# Patient Record
Sex: Female | Born: 1960
Health system: Southern US, Community
[De-identification: ages and names within clinical notes are randomized; demographics above are authoritative.]

## PROBLEM LIST (undated history)

## (undated) DIAGNOSIS — Z7901 Long term (current) use of anticoagulants: Secondary | ICD-10-CM

## (undated) DIAGNOSIS — E11319 Type 2 diabetes mellitus with unspecified diabetic retinopathy without macular edema: Secondary | ICD-10-CM

## (undated) DIAGNOSIS — R112 Nausea with vomiting, unspecified: Secondary | ICD-10-CM

## (undated) DIAGNOSIS — M5136 Other intervertebral disc degeneration, lumbar region: Secondary | ICD-10-CM

## (undated) DIAGNOSIS — Z9889 Other specified postprocedural states: Secondary | ICD-10-CM

## (undated) DIAGNOSIS — D631 Anemia in chronic kidney disease: Secondary | ICD-10-CM

## (undated) DIAGNOSIS — I509 Heart failure, unspecified: Secondary | ICD-10-CM

## (undated) DIAGNOSIS — N186 End stage renal disease: Secondary | ICD-10-CM

## (undated) DIAGNOSIS — I5030 Unspecified diastolic (congestive) heart failure: Secondary | ICD-10-CM

## (undated) DIAGNOSIS — G4730 Sleep apnea, unspecified: Secondary | ICD-10-CM

## (undated) DIAGNOSIS — E669 Obesity, unspecified: Secondary | ICD-10-CM

## (undated) DIAGNOSIS — M5431 Sciatica, right side: Secondary | ICD-10-CM

## (undated) DIAGNOSIS — E1140 Type 2 diabetes mellitus with diabetic neuropathy, unspecified: Secondary | ICD-10-CM

## (undated) DIAGNOSIS — N189 Chronic kidney disease, unspecified: Principal | ICD-10-CM

## (undated) DIAGNOSIS — Z951 Presence of aortocoronary bypass graft: Secondary | ICD-10-CM

## (undated) DIAGNOSIS — I219 Acute myocardial infarction, unspecified: Secondary | ICD-10-CM

## (undated) DIAGNOSIS — I639 Cerebral infarction, unspecified: Secondary | ICD-10-CM

## (undated) DIAGNOSIS — H3320 Serous retinal detachment, unspecified eye: Secondary | ICD-10-CM

## (undated) DIAGNOSIS — E119 Type 2 diabetes mellitus without complications: Secondary | ICD-10-CM

## (undated) DIAGNOSIS — I10 Essential (primary) hypertension: Secondary | ICD-10-CM

## (undated) DIAGNOSIS — R011 Cardiac murmur, unspecified: Secondary | ICD-10-CM

## (undated) DIAGNOSIS — E785 Hyperlipidemia, unspecified: Secondary | ICD-10-CM

## (undated) DIAGNOSIS — B019 Varicella without complication: Secondary | ICD-10-CM

## (undated) DIAGNOSIS — M51369 Other intervertebral disc degeneration, lumbar region without mention of lumbar back pain or lower extremity pain: Secondary | ICD-10-CM

## (undated) DIAGNOSIS — H547 Unspecified visual loss: Secondary | ICD-10-CM

## (undated) DIAGNOSIS — I700 Atherosclerosis of aorta: Secondary | ICD-10-CM

## (undated) DIAGNOSIS — N2581 Secondary hyperparathyroidism of renal origin: Secondary | ICD-10-CM

## (undated) DIAGNOSIS — I2510 Atherosclerotic heart disease of native coronary artery without angina pectoris: Secondary | ICD-10-CM

## (undated) DIAGNOSIS — J189 Pneumonia, unspecified organism: Secondary | ICD-10-CM

## (undated) DIAGNOSIS — F419 Anxiety disorder, unspecified: Secondary | ICD-10-CM

## (undated) DIAGNOSIS — R0600 Dyspnea, unspecified: Secondary | ICD-10-CM

## (undated) DIAGNOSIS — N185 Chronic kidney disease, stage 5: Secondary | ICD-10-CM

## (undated) HISTORY — DX: Unspecified visual loss: H54.7

## (undated) HISTORY — DX: Heart failure, unspecified: I50.9

## (undated) HISTORY — DX: Chronic kidney disease, unspecified: N18.9

## (undated) HISTORY — DX: Hyperlipidemia, unspecified: E78.5

## (undated) HISTORY — DX: Serous retinal detachment, unspecified eye: H33.20

## (undated) HISTORY — DX: Anemia in chronic kidney disease: D63.1

## (undated) HISTORY — PX: INCISION AND DRAINAGE: SHX5863

## (undated) HISTORY — DX: Atherosclerotic heart disease of native coronary artery without angina pectoris: I25.10

## (undated) HISTORY — DX: Sciatica, right side: M54.31

## (undated) HISTORY — DX: Type 2 diabetes mellitus without complications: E11.9

## (undated) HISTORY — PX: RETINAL LASER PROCEDURE: SHX2339

---

## 2004-08-15 ENCOUNTER — Other Ambulatory Visit: Payer: Self-pay

## 2005-10-25 ENCOUNTER — Ambulatory Visit: Payer: Self-pay | Admitting: Internal Medicine

## 2005-11-15 ENCOUNTER — Ambulatory Visit: Payer: Self-pay | Admitting: Internal Medicine

## 2005-11-24 ENCOUNTER — Ambulatory Visit: Payer: Self-pay | Admitting: Internal Medicine

## 2007-09-20 ENCOUNTER — Ambulatory Visit: Payer: Self-pay | Admitting: Internal Medicine

## 2007-10-11 ENCOUNTER — Ambulatory Visit: Payer: Self-pay | Admitting: Internal Medicine

## 2008-02-11 ENCOUNTER — Emergency Department: Payer: Self-pay | Admitting: Unknown Physician Specialty

## 2008-02-11 ENCOUNTER — Other Ambulatory Visit: Payer: Self-pay

## 2010-03-01 ENCOUNTER — Ambulatory Visit: Payer: Self-pay | Admitting: Internal Medicine

## 2010-03-04 ENCOUNTER — Ambulatory Visit: Payer: Self-pay | Admitting: Internal Medicine

## 2010-06-08 ENCOUNTER — Emergency Department: Payer: Self-pay | Admitting: Emergency Medicine

## 2011-06-20 ENCOUNTER — Inpatient Hospital Stay: Payer: Self-pay | Admitting: Internal Medicine

## 2011-12-21 HISTORY — PX: CORONARY ARTERY BYPASS GRAFT: SHX141

## 2012-03-24 ENCOUNTER — Ambulatory Visit: Payer: Self-pay | Admitting: Internal Medicine

## 2012-06-19 DIAGNOSIS — I214 Non-ST elevation (NSTEMI) myocardial infarction: Secondary | ICD-10-CM

## 2012-06-19 HISTORY — DX: Non-ST elevation (NSTEMI) myocardial infarction: I21.4

## 2012-08-16 DIAGNOSIS — E785 Hyperlipidemia, unspecified: Secondary | ICD-10-CM | POA: Insufficient documentation

## 2012-08-16 DIAGNOSIS — Z780 Asymptomatic menopausal state: Secondary | ICD-10-CM | POA: Insufficient documentation

## 2012-08-16 DIAGNOSIS — Z973 Presence of spectacles and contact lenses: Secondary | ICD-10-CM | POA: Insufficient documentation

## 2012-08-16 DIAGNOSIS — I219 Acute myocardial infarction, unspecified: Secondary | ICD-10-CM | POA: Insufficient documentation

## 2012-08-16 DIAGNOSIS — E669 Obesity, unspecified: Secondary | ICD-10-CM | POA: Insufficient documentation

## 2012-08-16 DIAGNOSIS — I10 Essential (primary) hypertension: Secondary | ICD-10-CM | POA: Insufficient documentation

## 2012-08-17 DIAGNOSIS — I2510 Atherosclerotic heart disease of native coronary artery without angina pectoris: Secondary | ICD-10-CM | POA: Insufficient documentation

## 2012-08-20 DIAGNOSIS — T8149XA Infection following a procedure, other surgical site, initial encounter: Secondary | ICD-10-CM | POA: Insufficient documentation

## 2012-10-23 ENCOUNTER — Encounter: Payer: Self-pay | Admitting: Cardiology

## 2012-11-19 ENCOUNTER — Encounter: Payer: Self-pay | Admitting: Cardiology

## 2013-02-22 DIAGNOSIS — B351 Tinea unguium: Secondary | ICD-10-CM | POA: Insufficient documentation

## 2013-02-22 DIAGNOSIS — M79673 Pain in unspecified foot: Secondary | ICD-10-CM | POA: Insufficient documentation

## 2013-02-22 DIAGNOSIS — G629 Polyneuropathy, unspecified: Secondary | ICD-10-CM | POA: Insufficient documentation

## 2013-05-07 DIAGNOSIS — E11311 Type 2 diabetes mellitus with unspecified diabetic retinopathy with macular edema: Secondary | ICD-10-CM | POA: Insufficient documentation

## 2013-05-07 DIAGNOSIS — H211X9 Other vascular disorders of iris and ciliary body, unspecified eye: Secondary | ICD-10-CM | POA: Insufficient documentation

## 2013-05-29 ENCOUNTER — Ambulatory Visit: Payer: Self-pay | Admitting: Internal Medicine

## 2014-04-17 ENCOUNTER — Ambulatory Visit: Payer: Self-pay | Admitting: Nephrology

## 2014-07-30 ENCOUNTER — Ambulatory Visit: Payer: Self-pay | Admitting: Internal Medicine

## 2014-09-02 DIAGNOSIS — M722 Plantar fascial fibromatosis: Secondary | ICD-10-CM | POA: Insufficient documentation

## 2014-10-28 ENCOUNTER — Ambulatory Visit: Payer: Self-pay | Admitting: Internal Medicine

## 2014-11-13 DIAGNOSIS — E113519 Type 2 diabetes mellitus with proliferative diabetic retinopathy with macular edema, unspecified eye: Secondary | ICD-10-CM | POA: Insufficient documentation

## 2014-11-19 ENCOUNTER — Ambulatory Visit: Payer: Self-pay | Admitting: Internal Medicine

## 2015-04-22 ENCOUNTER — Other Ambulatory Visit: Payer: Self-pay | Admitting: Internal Medicine

## 2015-04-22 DIAGNOSIS — R519 Headache, unspecified: Secondary | ICD-10-CM

## 2015-04-22 DIAGNOSIS — R51 Headache: Principal | ICD-10-CM

## 2015-04-25 ENCOUNTER — Ambulatory Visit: Payer: Medicare Other | Attending: Internal Medicine

## 2015-05-01 DIAGNOSIS — H35372 Puckering of macula, left eye: Secondary | ICD-10-CM | POA: Insufficient documentation

## 2015-10-01 DIAGNOSIS — E119 Type 2 diabetes mellitus without complications: Secondary | ICD-10-CM | POA: Insufficient documentation

## 2015-10-01 DIAGNOSIS — E1129 Type 2 diabetes mellitus with other diabetic kidney complication: Secondary | ICD-10-CM | POA: Insufficient documentation

## 2015-10-22 ENCOUNTER — Other Ambulatory Visit: Payer: Self-pay | Admitting: Internal Medicine

## 2015-10-22 DIAGNOSIS — Z1231 Encounter for screening mammogram for malignant neoplasm of breast: Secondary | ICD-10-CM

## 2015-11-03 ENCOUNTER — Ambulatory Visit
Admission: RE | Admit: 2015-11-03 | Discharge: 2015-11-03 | Disposition: A | Discharge status: Home / Self Care | Payer: 59 | Source: Ambulatory Visit | Attending: Internal Medicine | Admitting: Internal Medicine

## 2015-11-03 DIAGNOSIS — Z1231 Encounter for screening mammogram for malignant neoplasm of breast: Secondary | ICD-10-CM | POA: Diagnosis present

## 2015-11-05 ENCOUNTER — Inpatient Hospital Stay: Payer: 59 | Attending: Internal Medicine | Admitting: Internal Medicine

## 2015-11-05 ENCOUNTER — Inpatient Hospital Stay: Payer: 59

## 2015-11-05 ENCOUNTER — Encounter: Payer: Self-pay | Admitting: Internal Medicine

## 2015-11-05 VITALS — BP 138/75 | HR 63 | Temp 98.3°F | Resp 20 | Ht 68.6 in | Wt 244.7 lb

## 2015-11-05 DIAGNOSIS — E11319 Type 2 diabetes mellitus with unspecified diabetic retinopathy without macular edema: Secondary | ICD-10-CM

## 2015-11-05 DIAGNOSIS — D649 Anemia, unspecified: Secondary | ICD-10-CM | POA: Insufficient documentation

## 2015-11-05 DIAGNOSIS — D472 Monoclonal gammopathy: Secondary | ICD-10-CM | POA: Diagnosis not present

## 2015-11-05 DIAGNOSIS — Z79899 Other long term (current) drug therapy: Secondary | ICD-10-CM | POA: Diagnosis not present

## 2015-11-05 DIAGNOSIS — E1122 Type 2 diabetes mellitus with diabetic chronic kidney disease: Secondary | ICD-10-CM | POA: Diagnosis not present

## 2015-11-05 DIAGNOSIS — Z794 Long term (current) use of insulin: Secondary | ICD-10-CM | POA: Insufficient documentation

## 2015-11-05 DIAGNOSIS — I2510 Atherosclerotic heart disease of native coronary artery without angina pectoris: Secondary | ICD-10-CM | POA: Insufficient documentation

## 2015-11-05 DIAGNOSIS — Z7982 Long term (current) use of aspirin: Secondary | ICD-10-CM | POA: Insufficient documentation

## 2015-11-05 DIAGNOSIS — Z951 Presence of aortocoronary bypass graft: Secondary | ICD-10-CM | POA: Diagnosis not present

## 2015-11-05 DIAGNOSIS — E785 Hyperlipidemia, unspecified: Secondary | ICD-10-CM

## 2015-11-05 DIAGNOSIS — E1165 Type 2 diabetes mellitus with hyperglycemia: Secondary | ICD-10-CM | POA: Insufficient documentation

## 2015-11-05 DIAGNOSIS — N189 Chronic kidney disease, unspecified: Secondary | ICD-10-CM | POA: Diagnosis not present

## 2015-11-05 DIAGNOSIS — R5383 Other fatigue: Secondary | ICD-10-CM

## 2015-11-05 LAB — CBC WITH DIFFERENTIAL/PLATELET
BASOS ABS: 0.1 10*3/uL (ref 0–0.1)
BASOS PCT: 1 %
Eosinophils Absolute: 0.3 10*3/uL (ref 0–0.7)
Eosinophils Relative: 3 %
HEMATOCRIT: 34 % — AB (ref 35.0–47.0)
Hemoglobin: 11.5 g/dL — ABNORMAL LOW (ref 12.0–16.0)
LYMPHS PCT: 25 %
Lymphs Abs: 2.4 10*3/uL (ref 1.0–3.6)
MCH: 28.3 pg (ref 26.0–34.0)
MCHC: 33.9 g/dL (ref 32.0–36.0)
MCV: 83.5 fL (ref 80.0–100.0)
MONO ABS: 0.8 10*3/uL (ref 0.2–0.9)
Monocytes Relative: 8 %
NEUTROS ABS: 6.2 10*3/uL (ref 1.4–6.5)
Neutrophils Relative %: 63 %
PLATELETS: 343 10*3/uL (ref 150–440)
RBC: 4.07 MIL/uL (ref 3.80–5.20)
RDW: 13.9 % (ref 11.5–14.5)
WBC: 9.9 10*3/uL (ref 3.6–11.0)

## 2015-11-05 LAB — CREATININE, SERUM
Creatinine, Ser: 1.7 mg/dL — ABNORMAL HIGH (ref 0.44–1.00)
GFR, EST AFRICAN AMERICAN: 38 mL/min — AB (ref >60)
GFR, EST NON AFRICAN AMERICAN: 33 mL/min — AB (ref >60)

## 2015-11-05 LAB — IRON AND TIBC
Iron: 83 ug/dL (ref 28–170)
SATURATION RATIOS: 24 % (ref 10.4–31.8)
TIBC: 344 ug/dL (ref 250–450)
UIBC: 261 ug/dL

## 2015-11-05 LAB — LACTATE DEHYDROGENASE: LDH: 205 U/L — AB (ref 98–192)

## 2015-11-05 LAB — RETICULOCYTES
RBC.: 4.07 MIL/uL (ref 3.80–5.20)
Retic Count, Absolute: 130.2 10*3/uL (ref 19.0–183.0)
Retic Ct Pct: 3.2 % — ABNORMAL HIGH (ref 0.4–3.1)

## 2015-11-05 LAB — FERRITIN: Ferritin: 211 ng/mL (ref 11–307)

## 2015-11-05 NOTE — Progress Notes (Signed)
[place] @ [facility] Telephone:(336) [phone]  Fax:(336) [phone]     Sharon Arias OB: 12/31/1960  MR#: [id number]  VHQ#:[id number]  Patient Care Team: Idelle Crouch, MD as PCP - General (Internal Medicine)  CHIEF COMPLAINT:  Chief Complaint  Patient presents with  . Anemia     No history exists.    No flowsheet data found.  HISTORY OF PRESENT ILLNESS:   Sharon Arias is a 54 year old female who was referred to our clinic for evaluation of anemia. Review of medical records and past medical history suggests that she has had moderate anemia since at least 2013 with 2014. Her hemoglobin has been hovering around 10 g/dL since then so far there has not been as special last workup performed in order to elucidate the reason for the anemia. Sharon Arias has had a poorly controlled diabetes for a long period of time, which led to development of mild renal insufficiency, significant coronary artery disease which required CABG surgery in 2013, as well as retinopathy and retinal detachment. She claims that her energy level has been fairly low since the time of CABG surgery, but she specifically denies shortness of breath, chest pain, dizziness, palpitations, excessive bleeding, change in the color of stool. She has not had a colonoscopy yet. She claims to follow healthy diet with sufficient amount of vegetables, including leafy vegetables. She is not a vegetarian, however.  REVIEW OF SYSTEMS:   Review of Systems  All other systems reviewed and are negative.    PAST MEDICAL HISTORY: Past Medical History  Diagnosis Date  . Diabetes mellitus without complication ([place])   . Hyperlipidemia   . CAD (coronary artery disease)   . Sciatica of right side     going to see physiciatry  . Detached retina   . Chronic kidney disease     per dr sparks    PAST SURGICAL HISTORY: Past Surgical History  Procedure Laterality Date  . Coronary artery bypass graft  2013  . Retinal laser procedure       FAMILY HISTORY Family History  Problem Relation Age of Onset  . Breast cancer Mother   . Prostate cancer Father     we think mets to liver and bone    ADVANCED DIRECTIVES:  No flowsheet data found.  HEALTH MAINTENANCE: Social History  Substance Use Topics  . Smoking status: Never Smoker   . Smokeless tobacco: Never Used  . Alcohol Use: No     No Known Allergies  Current Outpatient Prescriptions  Medication Sig Dispense Refill  . amLODipine (NORVASC) 10 MG tablet     . aspirin EC 81 MG tablet Take by mouth.    [name] atorvastatin (LIPITOR) 40 MG tablet Take by mouth.    . furosemide (LASIX) 40 MG tablet Take 1 tablet by mouth  daily    . Insulin Glargine (LANTUS SOLOSTAR) 100 UNIT/ML Solostar Pen Inject subcutaneously 74  units at bedtime ( Split  into 2 shots of 37 units  each at 2 different sites )    . insulin lispro (HUMALOG KWIKPEN) 100 UNIT/ML KiwkPen Inject subcutaneously 22 units with breakfast,24 units with lunch,and 32 units with dinner, plus sliding scale as directed.    [name] losartan (COZAAR) 100 MG tablet     . metoprolol (LOPRESSOR) 50 MG tablet Take 1 tablet by mouth two  times daily     No current facility-administered medications for this visit.    OBJECTIVE:  Filed Vitals:   11/05/15 1352  BP: 138/75  Pulse: 63  Temp: 98.3 F (36.8 C)  Resp: 20     Body mass index is 36.58 kg/(m^2).    ECOG FS:1 - Symptomatic but completely ambulatory  Physical Exam  Constitutional: She is oriented to person, place, and time and well-developed, well-nourished, and in no distress. No distress.  Morbidly obese Caucasian female (Body mass index is 36.58 kg/(m^2).   HENT:  Head: Normocephalic and atraumatic.  Right Ear: External ear normal.  Left Ear: External ear normal.  Mouth/Throat: Oropharynx is clear and moist.  Eyes: Conjunctivae are normal. Pupils are equal, round, and reactive to light. Right eye exhibits no discharge. Left eye exhibits no discharge. No  scleral icterus.  Neck: Normal range of motion. Neck supple. No JVD present. No tracheal deviation present. No thyromegaly present.  Cardiovascular: Normal rate, regular rhythm, normal heart sounds and intact distal pulses.  Exam reveals no gallop and no friction rub.   No murmur heard. Pulmonary/Chest: Effort normal and breath sounds normal. No stridor. No respiratory distress. She has no wheezes. She has no rales. She exhibits no tenderness.  Abdominal: Soft. Bowel sounds are normal. She exhibits no distension and no mass. There is no tenderness. There is no rebound and no guarding.  Genitourinary:  Postponed  Musculoskeletal: Normal range of motion. She exhibits no edema or tenderness.  Lymphadenopathy:    She has no cervical adenopathy.  Neurological: She is alert and oriented to person, place, and time. She has normal reflexes. No cranial nerve deficit. She exhibits normal muscle tone. Gait normal. Coordination normal. GCS score is 15.  Skin: Skin is warm. No rash noted. She is not diaphoretic. No erythema. No pallor.  Psychiatric: Mood, memory, affect and judgment normal.  Vitals reviewed.    LAB RESULTS:  Recent Results (from the past 2160 hour(s))  CBC with Differential/Platelet     Status: Abnormal   Collection Time: 11/05/15  2:43 PM  Result Value Ref Range   WBC 9.9 3.6 - 11.0 K/uL   RBC 4.07 3.80 - 5.20 MIL/uL   Hemoglobin 11.5 (L) 12.0 - 16.0 g/dL   HCT 34.0 (L) 35.0 - 47.0 %   MCV 83.5 80.0 - 100.0 fL   MCH 28.3 26.0 - 34.0 pg   MCHC 33.9 32.0 - 36.0 g/dL   RDW 13.9 11.5 - 14.5 %   Platelets 343 150 - 440 K/uL   Neutrophils Relative % 63 %   Neutro Abs 6.2 1.4 - 6.5 K/uL   Lymphocytes Relative 25 %   Lymphs Abs 2.4 1.0 - 3.6 K/uL   Monocytes Relative 8 %   Monocytes Absolute 0.8 0.2 - 0.9 K/uL   Eosinophils Relative 3 %   Eosinophils Absolute 0.3 0 - 0.7 K/uL   Basophils Relative 1 %   Basophils Absolute 0.1 0 - 0.1 K/uL  Creatinine, serum     Status: Abnormal    Collection Time: 11/05/15  2:43 PM  Result Value Ref Range   Creatinine, Ser 1.70 (H) 0.44 - 1.00 mg/dL   GFR calc non Af Amer 33 (L) >60 mL/min   GFR calc Af Amer 38 (L) >60 mL/min    Comment: (NOTE) The eGFR has been calculated using the CKD EPI equation. This calculation has not been validated in all clinical situations. eGFR's persistently <60 mL/min signify possible Chronic Kidney Disease.   Lactate dehydrogenase     Status: Abnormal   Collection Time: 11/05/15  2:43 PM  Result Value Ref Range   LDH 205 (H)  98 - 192 U/L  Reticulocytes     Status: Abnormal   Collection Time: 11/05/15  2:43 PM  Result Value Ref Range   Retic Ct Pct 3.2 (H) 0.4 - 3.1 %   RBC. 4.07 3.80 - 5.20 MIL/uL   Retic Count, Manual 130.2 19.0 - 183.0 K/uL  Ferritin     Status: None   Collection Time: 11/05/15  2:43 PM  Result Value Ref Range   Ferritin 211 11 - 307 ng/mL  Iron and TIBC     Status: None   Collection Time: 11/05/15  2:43 PM  Result Value Ref Range   Iron 83 28 - 170 ug/dL   TIBC 344 250 - 450 ug/dL   Saturation Ratios 24 10.4 - 31.8 %   UIBC 261 ug/dL     STUDIES: Mm Digital Screening Bilateral  11/03/2015  CLINICAL DATA:  Screening. EXAM: DIGITAL SCREENING BILATERAL MAMMOGRAM WITH CAD COMPARISON:  Previous exam(s). ACR Breast Density Category b: There are scattered areas of fibroglandular density. FINDINGS: There are no findings suspicious for malignancy. Images were processed with CAD. IMPRESSION: No mammographic evidence of malignancy. A result letter of this screening mammogram will be mailed directly to the patient. RECOMMENDATION: Screening mammogram in one year. (Code:SM-B-01Y) BI-RADS CATEGORY  1: Negative. Electronically Signed   By: Ammie Ferrier M.D.   On: 11/03/2015 08:54    ASSESSMENT AND PLAN: 1. Anemia- she could have multifactorial anemia, with a component of renal insufficiency and chronic inflammation, which is supported by the fact that recently her ESR was  elevated at 70 mm/h. She, however, does not have any specific complaints which could be attributed to an inflammatory process. Her MCV is within normal range, as such there is no clear evidence of significant iron deficiency, or vitamin deficiency. We will perform a full workup, which will include iron profile, red blood cell folate, serum protein electrophoresis and immunofixation, serum free light chain assay, LDH, reticulocyte count. TSH and vitamin B12 levels have been recently checked and appeared to be within normal range.he colonoscopy will be arranged by her primary care physician within the next few weeks. 2. Renal insufficiency- we will recheck a CMP today, to monitor her renal status. It is quite likely that renal insufficiency is related to poorly controlled diabetes. 3. Diabetes- Sharon Arias had a long history of poorly controlled diabetes, however, since undergoing CABG surgery she has made significant improvement in her lifestyle and dietary habits. Her blood sugars are much better controlled him a and her hemoglobin A1c is closer to the target range, then before. She, however, realizes that she needs to increase her physical activity, in order to improve glucose control and Breen hemoglobin A1c firmly into the desired range.  Return to our clinic in 2 weeks to discuss the results of the workup and make plans for follow-up.   Patient expressed understanding and was in agreement with this plan. She also understands that She can call clinic at any time with any questions, concerns, or complaints.    No matching staging information was found for the patient.  Roxana Hires, MD   11/05/2015 2:36 PM

## 2015-11-05 NOTE — Progress Notes (Signed)
Pt here for new eval of anemia, she states that she is tired but she stick to a routine to keep her diabetes in control with diet and sleep on schedule.  She could take a nap if she let herself but she will not do it.  She has sciatica per dr sparks on right hip and going to see dr Sharlet Salina for this.  She is being set up with Gi appt.  She does not see blood in stool or urine. Never taken iron pills to her knowledge.

## 2015-11-06 LAB — FOLATE RBC
FOLATE, RBC: 1012 ng/mL (ref >498)
Folate, Hemolysate: 337.1 ng/mL
Hematocrit: 33.3 % — ABNORMAL LOW (ref 34.0–46.6)

## 2015-11-06 LAB — KAPPA/LAMBDA LIGHT CHAINS
KAPPA FREE LGHT CHN: 50.34 mg/L — AB (ref 3.30–19.40)
KAPPA, LAMDA LIGHT CHAIN RATIO: 2.44 — AB (ref 0.26–1.65)
LAMDA FREE LIGHT CHAINS: 20.65 mg/L (ref 5.71–26.30)

## 2015-11-06 LAB — MULTIPLE MYELOMA PANEL, SERUM
ALBUMIN SERPL ELPH-MCNC: 3.6 g/dL (ref 2.9–4.4)
ALBUMIN/GLOB SERPL: 1 (ref 0.7–1.7)
ALPHA 1: 0.2 g/dL (ref 0.0–0.4)
ALPHA2 GLOB SERPL ELPH-MCNC: 1 g/dL (ref 0.4–1.0)
B-Globulin SerPl Elph-Mcnc: 1.2 g/dL (ref 0.7–1.3)
Gamma Glob SerPl Elph-Mcnc: 1.4 g/dL (ref 0.4–1.8)
Globulin, Total: 3.9 g/dL (ref 2.2–3.9)
IGG (IMMUNOGLOBIN G), SERUM: 1331 mg/dL (ref 700–1600)
IGM, SERUM: 50 mg/dL (ref 26–217)
IgA: 240 mg/dL (ref 87–352)
TOTAL PROTEIN ELP: 7.5 g/dL (ref 6.0–8.5)

## 2015-11-19 ENCOUNTER — Ambulatory Visit
Admission: RE | Admit: 2015-11-19 | Discharge: 2015-11-19 | Disposition: A | Discharge status: Home / Self Care | Payer: 59 | Source: Ambulatory Visit | Attending: Internal Medicine | Admitting: Internal Medicine

## 2015-11-19 ENCOUNTER — Inpatient Hospital Stay (HOSPITAL_BASED_OUTPATIENT_CLINIC_OR_DEPARTMENT_OTHER): Payer: 59 | Admitting: Internal Medicine

## 2015-11-19 ENCOUNTER — Encounter: Payer: Self-pay | Admitting: Internal Medicine

## 2015-11-19 VITALS — BP 121/68 | HR 61 | Temp 98.1°F | Resp 18 | Ht 68.6 in | Wt 246.9 lb

## 2015-11-19 DIAGNOSIS — Z951 Presence of aortocoronary bypass graft: Secondary | ICD-10-CM

## 2015-11-19 DIAGNOSIS — R5383 Other fatigue: Secondary | ICD-10-CM | POA: Diagnosis not present

## 2015-11-19 DIAGNOSIS — I2510 Atherosclerotic heart disease of native coronary artery without angina pectoris: Secondary | ICD-10-CM

## 2015-11-19 DIAGNOSIS — E785 Hyperlipidemia, unspecified: Secondary | ICD-10-CM

## 2015-11-19 DIAGNOSIS — Z794 Long term (current) use of insulin: Secondary | ICD-10-CM

## 2015-11-19 DIAGNOSIS — E1165 Type 2 diabetes mellitus with hyperglycemia: Secondary | ICD-10-CM

## 2015-11-19 DIAGNOSIS — E1122 Type 2 diabetes mellitus with diabetic chronic kidney disease: Secondary | ICD-10-CM

## 2015-11-19 DIAGNOSIS — D472 Monoclonal gammopathy: Secondary | ICD-10-CM

## 2015-11-19 DIAGNOSIS — N189 Chronic kidney disease, unspecified: Secondary | ICD-10-CM

## 2015-11-19 DIAGNOSIS — D649 Anemia, unspecified: Secondary | ICD-10-CM | POA: Diagnosis not present

## 2015-11-19 DIAGNOSIS — E11319 Type 2 diabetes mellitus with unspecified diabetic retinopathy without macular edema: Secondary | ICD-10-CM

## 2015-11-19 NOTE — Progress Notes (Signed)
[place] @ [facility] Telephone:(336) [phone]  Fax:(336) [phone]     Sharon Arias OB: 02/03/1961  MR#: [id number]  PVG#:[id number]  Patient Care Team: Idelle Crouch, MD as PCP - General (Internal Medicine)  CHIEF COMPLAINT:  Chief Complaint  Patient presents with  . Anemia     No history exists.    No flowsheet data found.  HISTORY OF PRESENT ILLNESS:   Sharon Arias is a 54 year old female who was referred to our clinic for evaluation of anemia. Review of medical records and past medical history suggests that she has had moderate anemia since at least 2013 with 2014. Her hemoglobin has been hovering around 10 g/dL since then so far there has not been as special last workup performed in order to elucidate the reason for the anemia. Sharon Arias has had a poorly controlled diabetes for a long period of time, which led to development of mild renal insufficiency, significant coronary artery disease which required CABG surgery in 2013, as well as retinopathy and retinal detachment. She claims to follow healthy diet with sufficient amount of vegetables, including leafy vegetables. She is not a vegetarian, however. Please resume the right is a close was a signs hiatal I would yesterday with no CVA this patient by Current status:  Sharon Arias returns to our clinic to discuss the results of the workup. She feels somewhat tired, but overall better. She denies any shortness of breath, chest pain, nausea, vomiting, fevers, chills, diarrhea.  REVIEW OF SYSTEMS:   Review of Systems  All other systems reviewed and are negative.    PAST MEDICAL HISTORY: Past Medical History  Diagnosis Date  . Diabetes mellitus without complication ([place])   . Hyperlipidemia   . CAD (coronary artery disease)   . Sciatica of right side     going to see physiciatry  . Detached retina   . Chronic kidney disease     per dr sparks    PAST SURGICAL HISTORY: Past Surgical History  Procedure Laterality Date  .  Coronary artery bypass graft  2013  . Retinal laser procedure      FAMILY HISTORY Family History  Problem Relation Age of Onset  . Breast cancer Mother   . Prostate cancer Father     we think mets to liver and bone    ADVANCED DIRECTIVES:  No flowsheet data found.  HEALTH MAINTENANCE: Social History  Substance Use Topics  . Smoking status: Never Smoker   . Smokeless tobacco: Never Used  . Alcohol Use: No     No Known Allergies  Current Outpatient Prescriptions  Medication Sig Dispense Refill  . amLODipine (NORVASC) 10 MG tablet     . aspirin EC 81 MG tablet Take by mouth.    [name] atorvastatin (LIPITOR) 40 MG tablet Take by mouth.    . furosemide (LASIX) 40 MG tablet Take 1 tablet by mouth  daily    . Insulin Glargine (LANTUS SOLOSTAR) 100 UNIT/ML Solostar Pen Inject subcutaneously 74  units at bedtime ( Split  into 2 shots of 37 units  each at 2 different sites )    . insulin lispro (HUMALOG KWIKPEN) 100 UNIT/ML KiwkPen Inject subcutaneously 22 units with breakfast,24 units with lunch,and 32 units with dinner, plus sliding scale as directed.    [name] losartan (COZAAR) 100 MG tablet     . metoprolol (LOPRESSOR) 50 MG tablet Take 1 tablet by mouth two  times daily     No current facility-administered medications for this visit.  OBJECTIVE:  Filed Vitals:   11/19/15 0951  BP: 121/68  Pulse: 61  Temp: 98.1 F (36.7 C)  Resp: 18     Body mass index is 36.91 kg/(m^2).    ECOG FS:1 - Symptomatic but completely ambulatory  Physical Exam  Constitutional: She is oriented to person, place, and time and well-developed, well-nourished, and in no distress. No distress.  Morbidly obese Caucasian female (Body mass index is 36.58 kg/(m^2).   HENT:  Head: Normocephalic and atraumatic.  Right Ear: External ear normal.  Left Ear: External ear normal.  Mouth/Throat: Oropharynx is clear and moist.  Eyes: Conjunctivae are normal. Pupils are equal, round, and reactive to light. Right  eye exhibits no discharge. Left eye exhibits no discharge. No scleral icterus.  Neck: Normal range of motion. Neck supple. No JVD present. No tracheal deviation present. No thyromegaly present.  Cardiovascular: Normal rate, regular rhythm, normal heart sounds and intact distal pulses.  Exam reveals no gallop and no friction rub.   No murmur heard. Pulmonary/Chest: Effort normal and breath sounds normal. No stridor. No respiratory distress. She has no wheezes. She has no rales. She exhibits no tenderness.  Abdominal: Soft. Bowel sounds are normal. She exhibits no distension and no mass. There is no tenderness. There is no rebound and no guarding.  Genitourinary:  Postponed  Musculoskeletal: Normal range of motion. She exhibits no edema or tenderness.  Lymphadenopathy:    She has no cervical adenopathy.  Neurological: She is alert and oriented to person, place, and time. She has normal reflexes. No cranial nerve deficit. She exhibits normal muscle tone. Gait normal. Coordination normal. GCS score is 15.  Skin: Skin is warm. No rash noted. She is not diaphoretic. No erythema. No pallor.  Psychiatric: Mood, memory, affect and judgment normal.  Vitals reviewed.    LAB RESULTS:  Recent Results (from the past 2160 hour(s))  CBC with Differential/Platelet     Status: Abnormal   Collection Time: 11/05/15  2:43 PM  Result Value Ref Range   WBC 9.9 3.6 - 11.0 K/uL   RBC 4.07 3.80 - 5.20 MIL/uL   Hemoglobin 11.5 (L) 12.0 - 16.0 g/dL   HCT 11.4 (L) 66.1 - 98.2 %   MCV 83.5 80.0 - 100.0 fL   MCH 28.3 26.0 - 34.0 pg   MCHC 33.9 32.0 - 36.0 g/dL   RDW 04.5 80.0 - 19.0 %   Platelets 343 150 - 440 K/uL   Neutrophils Relative % 63 %   Neutro Abs 6.2 1.4 - 6.5 K/uL   Lymphocytes Relative 25 %   Lymphs Abs 2.4 1.0 - 3.6 K/uL   Monocytes Relative 8 %   Monocytes Absolute 0.8 0.2 - 0.9 K/uL   Eosinophils Relative 3 %   Eosinophils Absolute 0.3 0 - 0.7 K/uL   Basophils Relative 1 %   Basophils  Absolute 0.1 0 - 0.1 K/uL  Creatinine, serum     Status: Abnormal   Collection Time: 11/05/15  2:43 PM  Result Value Ref Range   Creatinine, Ser 1.70 (H) 0.44 - 1.00 mg/dL   GFR calc non Af Amer 33 (L) >60 mL/min   GFR calc Af Amer 38 (L) >60 mL/min    Comment: (NOTE) The eGFR has been calculated using the CKD EPI equation. This calculation has not been validated in all clinical situations. eGFR's persistently <60 mL/min signify possible Chronic Kidney Disease.   Lactate dehydrogenase     Status: Abnormal   Collection Time: 11/05/15  2:43 PM  Result Value Ref Range   LDH 205 (H) 98 - 192 U/L  Reticulocytes     Status: Abnormal   Collection Time: 11/05/15  2:43 PM  Result Value Ref Range   Retic Ct Pct 3.2 (H) 0.4 - 3.1 %   RBC. 4.07 3.80 - 5.20 MIL/uL   Retic Count, Manual 130.2 19.0 - 183.0 K/uL  Ferritin     Status: None   Collection Time: 11/05/15  2:43 PM  Result Value Ref Range   Ferritin 211 11 - 307 ng/mL  Iron and TIBC     Status: None   Collection Time: 11/05/15  2:43 PM  Result Value Ref Range   Iron 83 28 - 170 ug/dL   TIBC 344 250 - 450 ug/dL   Saturation Ratios 24 10.4 - 31.8 %   UIBC 261 ug/dL  Folate RBC     Status: Abnormal   Collection Time: 11/05/15  2:43 PM  Result Value Ref Range   Folate, Hemolysate 337.1 Not Estab. ng/mL   Hematocrit 33.3 (L) 34.0 - 46.6 %   Folate, RBC 1012 >498 ng/mL    Comment: (NOTE) Performed At: [facility] [place], [place] [id number] Lindon Romp MD [id number]   Kappa/lambda light chains     Status: Abnormal   Collection Time: 11/05/15  2:43 PM  Result Value Ref Range   Kappa free light chain 50.34 (H) 3.30 - 19.40 mg/L   Lamda free light chains 20.65 5.71 - 26.30 mg/L   Kappa, lamda light chain ratio 2.44 (H) 0.26 - 1.65    Comment: (NOTE) Performed At: [facility] [place], [place] [id number] Lindon Romp MD WI:[id number]   Multiple myeloma panel, serum  (IFE and PE, serum)     Status: None   Collection Time: 11/05/15  2:43 PM  Result Value Ref Range   IgG (Immunoglobin G), Serum 1331 700 - 1600 mg/dL   IgA 240 87 - 352 mg/dL   IgM, Serum 50 26 - 217 mg/dL   Total Protein ELP 7.5 6.0 - 8.5 g/dL   Albumin SerPl Elph-Mcnc 3.6 2.9 - 4.4 g/dL   Alpha 1 0.2 0.0 - 0.4 g/dL   Alpha2 Glob SerPl Elph-Mcnc 1.0 0.4 - 1.0 g/dL   B-Globulin SerPl Elph-Mcnc 1.2 0.7 - 1.3 g/dL   Gamma Glob SerPl Elph-Mcnc 1.4 0.4 - 1.8 g/dL   M Protein SerPl Elph-Mcnc Not Observed Not Observed g/dL   Globulin, Total 3.9 2.2 - 3.9 g/dL   Albumin/Glob SerPl 1.0 0.7 - 1.7   IFE 1 Comment     Comment: An apparent normal immunofixation pattern.   Please Note Comment     Comment: (NOTE) Protein electrophoresis scan will follow via computer, mail, or courier delivery. Performed At: [facility] [address] [place], [place] [id number] Lindon Romp MD EH:[id number]      STUDIES: Mm Digital Screening Bilateral  11/03/2015  CLINICAL DATA:  Screening. EXAM: DIGITAL SCREENING BILATERAL MAMMOGRAM WITH CAD COMPARISON:  Previous exam(s). ACR Breast Density Category b: There are scattered areas of fibroglandular density. FINDINGS: There are no findings suspicious for malignancy. Images were processed with CAD. IMPRESSION: No mammographic evidence of malignancy. A result letter of this screening mammogram will be mailed directly to the patient. RECOMMENDATION: Screening mammogram in one year. (Code:SM-B-01Y) BI-RADS CATEGORY  1: Negative. Electronically Signed   By: Ammie Ferrier M.D.   On: 11/03/2015 08:54    ASSESSMENT AND PLAN:  1. Anemia- extensive workup showed no clear evidence of vitamin deficiency or iron deficiency. Possibly, anemia is related to renal insufficiency. Anemia is mild at this point, so we will continue to observe.  2. Renal insufficiency- likely secondary to 3. Diabetes- Sharon Arias had a long history of poorly controlled diabetes,  however, since undergoing CABG surgery she has made significant improvement in her lifestyle and dietary habits. Her blood sugars are much better controlled him a and her hemoglobin A1c is closer to the target range, then before. She, however, realizes that she needs to increase her physical activity, in order to improve glucose control and Breen hemoglobin A1c firmly into the desired range. 4. Free kappa light chain monoclonal gammopathy-the exact significance is unclear, since kappa to lambda light chain abnormality can be seen in patients with renal insufficiency. Alternatively, such abnormality could be a sign of multiple myeloma/systemic amyloidosis with secondary renal involvement. We will perform bone survey, and repeat serum free light chain assay in 3 months and then in 6 months. We likely will need to monitor serum free light chain assay over the years to ensure that no transformation to a more aggressive form of plasma cell dyscrasia is seen.    Return to our clinic in 2 weeks to discuss the results of the workup and make plans for follow-up.   Patient expressed understanding and was in agreement with this plan. She also understands that She can call clinic at any time with any questions, concerns, or complaints.    No matching staging information was found for the patient.  Roxana Hires, MD   11/19/2015 9:58 AM

## 2015-11-19 NOTE — Progress Notes (Signed)
Pt here to get lab results in regards to her anemia work up.  Pt tired but same as before.

## 2015-11-25 ENCOUNTER — Other Ambulatory Visit: Payer: Self-pay | Admitting: Physical Medicine and Rehabilitation

## 2015-11-25 DIAGNOSIS — M5136 Other intervertebral disc degeneration, lumbar region: Secondary | ICD-10-CM

## 2015-11-25 DIAGNOSIS — M5416 Radiculopathy, lumbar region: Secondary | ICD-10-CM

## 2015-11-25 DIAGNOSIS — M48062 Spinal stenosis, lumbar region with neurogenic claudication: Secondary | ICD-10-CM

## 2015-12-17 ENCOUNTER — Ambulatory Visit
Admission: RE | Admit: 2015-12-17 | Discharge: 2015-12-17 | Disposition: A | Discharge status: Home / Self Care | Payer: 59 | Source: Ambulatory Visit | Attending: *Deleted | Admitting: *Deleted

## 2015-12-17 ENCOUNTER — Ambulatory Visit
Admission: RE | Admit: 2015-12-17 | Discharge: 2015-12-17 | Disposition: A | Discharge status: Home / Self Care | Payer: 59 | Source: Ambulatory Visit | Attending: Internal Medicine | Admitting: Internal Medicine

## 2015-12-17 ENCOUNTER — Ambulatory Visit
Admission: RE | Admit: 2015-12-17 | Discharge: 2015-12-17 | Disposition: A | Discharge status: Home / Self Care | Payer: 59 | Source: Ambulatory Visit | Attending: Physical Medicine and Rehabilitation | Admitting: Physical Medicine and Rehabilitation

## 2015-12-17 DIAGNOSIS — M5136 Other intervertebral disc degeneration, lumbar region: Secondary | ICD-10-CM | POA: Diagnosis present

## 2015-12-17 DIAGNOSIS — M5416 Radiculopathy, lumbar region: Secondary | ICD-10-CM

## 2015-12-17 DIAGNOSIS — D472 Monoclonal gammopathy: Secondary | ICD-10-CM | POA: Insufficient documentation

## 2015-12-17 DIAGNOSIS — M4806 Spinal stenosis, lumbar region: Secondary | ICD-10-CM | POA: Diagnosis present

## 2015-12-17 DIAGNOSIS — M48062 Spinal stenosis, lumbar region with neurogenic claudication: Secondary | ICD-10-CM

## 2016-01-13 DIAGNOSIS — M48062 Spinal stenosis, lumbar region with neurogenic claudication: Secondary | ICD-10-CM | POA: Insufficient documentation

## 2016-01-13 DIAGNOSIS — M5136 Other intervertebral disc degeneration, lumbar region: Secondary | ICD-10-CM | POA: Insufficient documentation

## 2016-01-13 DIAGNOSIS — M5416 Radiculopathy, lumbar region: Secondary | ICD-10-CM | POA: Insufficient documentation

## 2016-01-23 ENCOUNTER — Encounter: Payer: Self-pay | Admitting: *Deleted

## 2016-01-26 ENCOUNTER — Ambulatory Visit: Payer: 59 | Admitting: Anesthesiology

## 2016-01-26 ENCOUNTER — Encounter: Payer: Self-pay | Admitting: Anesthesiology

## 2016-01-26 ENCOUNTER — Encounter
Admission: RE | Disposition: A | Discharge status: Home / Self Care | Payer: Self-pay | Source: Ambulatory Visit | Attending: Gastroenterology

## 2016-01-26 ENCOUNTER — Ambulatory Visit
Admission: RE | Admit: 2016-01-26 | Discharge: 2016-01-26 | Disposition: A | Discharge status: Home / Self Care | Payer: 59 | Source: Ambulatory Visit | Attending: Gastroenterology | Admitting: Gastroenterology

## 2016-01-26 DIAGNOSIS — E1122 Type 2 diabetes mellitus with diabetic chronic kidney disease: Secondary | ICD-10-CM | POA: Diagnosis not present

## 2016-01-26 DIAGNOSIS — Z538 Procedure and treatment not carried out for other reasons: Secondary | ICD-10-CM | POA: Diagnosis not present

## 2016-01-26 DIAGNOSIS — I129 Hypertensive chronic kidney disease with stage 1 through stage 4 chronic kidney disease, or unspecified chronic kidney disease: Secondary | ICD-10-CM | POA: Insufficient documentation

## 2016-01-26 DIAGNOSIS — E785 Hyperlipidemia, unspecified: Secondary | ICD-10-CM | POA: Insufficient documentation

## 2016-01-26 DIAGNOSIS — E11319 Type 2 diabetes mellitus with unspecified diabetic retinopathy without macular edema: Secondary | ICD-10-CM | POA: Diagnosis not present

## 2016-01-26 DIAGNOSIS — Z7982 Long term (current) use of aspirin: Secondary | ICD-10-CM | POA: Diagnosis not present

## 2016-01-26 DIAGNOSIS — E1140 Type 2 diabetes mellitus with diabetic neuropathy, unspecified: Secondary | ICD-10-CM | POA: Diagnosis not present

## 2016-01-26 DIAGNOSIS — Z6835 Body mass index (BMI) 35.0-35.9, adult: Secondary | ICD-10-CM | POA: Diagnosis not present

## 2016-01-26 DIAGNOSIS — Z794 Long term (current) use of insulin: Secondary | ICD-10-CM | POA: Insufficient documentation

## 2016-01-26 DIAGNOSIS — E669 Obesity, unspecified: Secondary | ICD-10-CM | POA: Insufficient documentation

## 2016-01-26 DIAGNOSIS — N189 Chronic kidney disease, unspecified: Secondary | ICD-10-CM | POA: Insufficient documentation

## 2016-01-26 DIAGNOSIS — Z1211 Encounter for screening for malignant neoplasm of colon: Secondary | ICD-10-CM | POA: Diagnosis present

## 2016-01-26 DIAGNOSIS — I2510 Atherosclerotic heart disease of native coronary artery without angina pectoris: Secondary | ICD-10-CM | POA: Diagnosis not present

## 2016-01-26 DIAGNOSIS — I252 Old myocardial infarction: Secondary | ICD-10-CM | POA: Diagnosis not present

## 2016-01-26 DIAGNOSIS — Z8673 Personal history of transient ischemic attack (TIA), and cerebral infarction without residual deficits: Secondary | ICD-10-CM | POA: Diagnosis not present

## 2016-01-26 DIAGNOSIS — Z9889 Other specified postprocedural states: Secondary | ICD-10-CM | POA: Diagnosis not present

## 2016-01-26 DIAGNOSIS — Z951 Presence of aortocoronary bypass graft: Secondary | ICD-10-CM | POA: Diagnosis not present

## 2016-01-26 DIAGNOSIS — H548 Legal blindness, as defined in USA: Secondary | ICD-10-CM | POA: Insufficient documentation

## 2016-01-26 HISTORY — DX: Acute myocardial infarction, unspecified: I21.9

## 2016-01-26 HISTORY — DX: Type 2 diabetes mellitus with diabetic neuropathy, unspecified: E11.40

## 2016-01-26 HISTORY — PX: COLONOSCOPY WITH PROPOFOL: SHX5780

## 2016-01-26 HISTORY — DX: Cerebral infarction, unspecified: I63.9

## 2016-01-26 HISTORY — DX: Nausea with vomiting, unspecified: R11.2

## 2016-01-26 HISTORY — DX: Type 2 diabetes mellitus with unspecified diabetic retinopathy without macular edema: E11.319

## 2016-01-26 HISTORY — DX: Other specified postprocedural states: Z98.890

## 2016-01-26 HISTORY — DX: Varicella without complication: B01.9

## 2016-01-26 HISTORY — DX: Essential (primary) hypertension: I10

## 2016-01-26 HISTORY — DX: Obesity, unspecified: E66.9

## 2016-01-26 LAB — GLUCOSE, CAPILLARY: Glucose-Capillary: 170 mg/dL — ABNORMAL HIGH (ref 65–99)

## 2016-01-26 SURGERY — COLONOSCOPY WITH PROPOFOL
Anesthesia: General

## 2016-01-26 MED ORDER — PROPOFOL 500 MG/50ML IV EMUL
INTRAVENOUS | Status: DC | PRN
  Administered 2016-01-26: 130 ug/kg/min via INTRAVENOUS

## 2016-01-26 MED ORDER — PROPOFOL 10 MG/ML IV BOLUS
INTRAVENOUS | Status: DC | PRN
  Administered 2016-01-26: 100 mg via INTRAVENOUS

## 2016-01-26 MED ORDER — SODIUM CHLORIDE 0.9 % IV SOLN
INTRAVENOUS | Status: DC
Start: 2016-01-26 — End: 2016-01-26
  Administered 2016-01-26: 10:00:00 via INTRAVENOUS

## 2016-01-26 MED ORDER — SODIUM CHLORIDE 0.9 % IV SOLN: INTRAVENOUS | Status: DC

## 2016-01-26 NOTE — Anesthesia Preprocedure Evaluation (Signed)
Anesthesia Evaluation  Patient identified by MRN, date of birth, ID band Patient awake    Reviewed: Allergy & Precautions, H&P , NPO status , Patient's Chart, lab work & pertinent test results  History of Anesthesia Complications (+) PONV and history of anesthetic complications  Airway Mallampati: III  TM Distance: >3 FB Neck ROM: limited    Dental  (+) Poor Dentition   Pulmonary neg shortness of breath, sleep apnea ,    Pulmonary exam normal breath sounds clear to auscultation       Cardiovascular Exercise Tolerance: Good hypertension, (-) angina+ CAD and + Past MI  (-) DOE Normal cardiovascular exam Rhythm:regular Rate:Normal     Neuro/Psych  Neuromuscular disease CVA, Residual Symptoms negative psych ROS   GI/Hepatic negative GI ROS, Neg liver ROS,   Endo/Other  diabetes, Poorly Controlled, Type 2, Insulin Dependent  Renal/GU CRFRenal disease  negative genitourinary   Musculoskeletal   Abdominal   Peds  Hematology negative hematology ROS (+)   Anesthesia Other Findings Past Medical History:   Hyperlipidemia                                               CAD (coronary artery disease)                                Sciatica of right side                                         Comment:going to see physiciatry   Detached retina                                              Chronic kidney disease                                         Comment:per dr sparks   Chicken pox                                                  CVA (cerebral vascular accident) ([place])                       Diabetic retinopathy ([place])                                     Comment:legally blind   Myocardial infarction [facility])                                  Hypertension  PONV (postoperative nausea and vomiting)                     Obesity                                                     Diabetes mellitus without complication ([place])                   Comment:type 2   Diabetic neuropathy ([facility])                                   Past Surgical History:   CORONARY ARTERY BYPASS GRAFT                     2013         RETINAL LASER PROCEDURE                                       INCISION AND DRAINAGE                                           Comment:chest abscess  BMI    Body Mass Index   35.95 kg/m 2      Reproductive/Obstetrics negative OB ROS                             Anesthesia Physical Anesthesia Plan  ASA: III  Anesthesia Plan: General   Post-op Pain Management:    Induction:   Airway Management Planned:   Additional Equipment:   Intra-op Plan:   Post-operative Plan:   Informed Consent: I have reviewed the patients History and Physical, chart, labs and discussed the procedure including the risks, benefits and alternatives for the proposed anesthesia with the patient or authorized representative who has indicated his/her understanding and acceptance.   Dental Advisory Given  Plan Discussed with: Anesthesiologist, CRNA and Surgeon  Anesthesia Plan Comments:         Anesthesia Quick Evaluation

## 2016-01-26 NOTE — Transfer of Care (Signed)
Immediate Anesthesia Transfer of Care Note  Patient: Sharon Arias  Procedure(s) Performed: Procedure(s): COLONOSCOPY WITH PROPOFOL (N/A)  Patient Location: Endoscopy Unit  Anesthesia Type:General  Level of Consciousness: sedated  Airway & Oxygen Therapy: Patient Spontanous Breathing and Patient connected to nasal cannula oxygen  Post-op Assessment: Report given to RN and Post -op Vital signs reviewed and stable  Post vital signs: Reviewed and stable  Last Vitals:  Filed Vitals:   01/26/16 0921  BP: 145/69  Pulse: 59  Temp: 36.3 C  Resp: 16    Complications: No apparent anesthesia complications

## 2016-01-26 NOTE — Brief Op Note (Signed)
Poor prep. Procedure aborted. Complete to distal sigmoid colon.

## 2016-01-26 NOTE — Op Note (Signed)
[facility]enter[facility] Patient Name: Sharon Arias Procedure Date: 01/26/2016 9:41 AM MRN: [id number] Account #: [id number] Date of Birth: 14-Nov-1961 Admit Type: Outpatient Age: 55 Room: [facility] ENDO ROOM 3 Gender: Female Note Status: Finalized Procedure:         Colonoscopy Indications:       Screening for colorectal malignant neoplasm Providers:         Lollie Sails, MD Referring MD:      Leonie Douglas. Doy Hutching, MD (Referring MD) Medicines:         Monitored Anesthesia Care Complications:     No immediate complications. Procedure:         Pre-Anesthesia Assessment:                    - ASA Grade Assessment: III - A patient with severe                     systemic disease.                    After obtaining informed consent, the colonoscope was                     passed under direct vision. Throughout the procedure, the                     patient's blood pressure, pulse, and oxygen saturations                     were monitored continuously. The Colonoscope was                     introduced through the anus with the intention of                     advancing to the cecum. The scope was advanced to the                     sigmoid colon before the procedure was aborted.                     Medications were given. The colonoscopy was aborted due to                     poor bowel prep. The quality of the bowel preparation was                     poor. Findings:      Semi-liquid stool was found in the rectum and in the sigmoid colon,       precluding visualization. Impression:        - The procedure was aborted due to poor bowel prep.                    - Preparation of the colon was poor.                    - Stool in the rectum and in the sigmoid colon.                    - No specimens collected.                    - The procedure was aborted due to poor bowel prep. Recommendation:    - Discharge patient to home.                    -  Repeat prep and  reschedule Procedure Code(s): --- Professional ---                    [phone], Sigmoidoscopy, flexible; diagnostic, including                     collection of specimen(s) by brushing or washing, when                     performed (separate procedure) Diagnosis Code(s): --- Professional ---                    Z12.11, Encounter for screening for malignant neoplasm of                     colon                    Z53.8, Procedure and treatment not carried out for other                     reasons CPT copyright 2014 American Medical Association. All rights reserved. The codes documented in this report are preliminary and upon coder review may  be revised to meet current compliance requirements. Lollie Sails, MD 01/26/2016 9:52:58 AM This report has been signed electronically. Number of Addenda: 0 Note Initiated On: 01/26/2016 9:41 AM Total Procedure Duration: 0 hours 2 minutes 18 seconds       [facility]

## 2016-01-26 NOTE — Anesthesia Postprocedure Evaluation (Signed)
Anesthesia Post Note  Patient: Sharon Arias  Procedure(s) Performed: Procedure(s) (LRB): COLONOSCOPY WITH PROPOFOL (N/A)  Patient location during evaluation: Endoscopy Anesthesia Type: General Level of consciousness: awake and alert Pain management: pain level controlled Vital Signs Assessment: post-procedure vital signs reviewed and stable Respiratory status: spontaneous breathing, nonlabored ventilation, respiratory function stable and patient connected to nasal cannula oxygen Cardiovascular status: blood pressure returned to baseline and stable Postop Assessment: no signs of nausea or vomiting Anesthetic complications: no    Last Vitals:  Filed Vitals:   01/26/16 1010 01/26/16 1020  BP: 131/52 133/63  Pulse: 56 57  Temp:    Resp: 14 14    Last Pain: There were no vitals filed for this visit.               Precious Haws Jourdyn Ferrin

## 2016-01-26 NOTE — H&P (Signed)
Outpatient short stay form Pre-procedure 01/26/2016 9:26 AM Lollie Sails MD  Primary Physician: Dr. Fulton Reek  Reason for visit:  Colonoscopy  History of present illness:  Patient is a 55 year old female presenting today for a screening colonoscopy. She tolerated her prep well. She takes no aspirin or blood thinning products.    Current facility-administered medications:  .  0.9 %  sodium chloride infusion, , Intravenous, Continuous, Lollie Sails, MD .  0.9 %  sodium chloride infusion, , Intravenous, Continuous, Lollie Sails, MD  Prescriptions prior to admission  Medication Sig Dispense Refill Last Dose  . amLODipine (NORVASC) 10 MG tablet    01/25/2016 at Unknown time  . aspirin EC 81 MG tablet Take by mouth.   01/26/2016 at 0700  . atorvastatin (LIPITOR) 40 MG tablet Take by mouth.   01/25/2016 at Unknown time  . furosemide (LASIX) 40 MG tablet Take 1 tablet by mouth  daily   01/25/2016 at Unknown time  . Insulin Glargine (LANTUS SOLOSTAR) 100 UNIT/ML Solostar Pen Inject subcutaneously 74  units at bedtime ( Split  into 2 shots of 37 units  each at 2 different sites )   01/25/2016 at Unknown time  . insulin lispro (HUMALOG KWIKPEN) 100 UNIT/ML KiwkPen Inject subcutaneously 22 units with breakfast,24 units with lunch,and 32 units with dinner, plus sliding scale as directed.   01/25/2016 at Unknown time  . losartan (COZAAR) 100 MG tablet    01/26/2016 at 070  . metoprolol (LOPRESSOR) 50 MG tablet Take 1 tablet by mouth two  times daily   01/26/2016 at 0700     No Known Allergies   Past Medical History  Diagnosis Date  . Hyperlipidemia   . CAD (coronary artery disease)   . Sciatica of right side     going to see physiciatry  . Detached retina   . Chronic kidney disease     per dr sparks  . Chicken pox   . CVA (cerebral vascular accident) ([place])   . Diabetic retinopathy ([place])     legally blind  . Myocardial infarction ([place])   . Hypertension   . PONV (postoperative nausea  and vomiting)   . Obesity   . Diabetes mellitus without complication ([place])     type 2  . Diabetic neuropathy ([place])     Review of systems:      Physical Exam    Heart and lungs: Regular rate and rhythm without rub or gallop, lungs are bilaterally clear.    HEENT: Normocephalic atraumatic eyes are anicteric    Other:     Pertinant exam for procedure: Soft nontender nondistended bowel sounds positive normoactive.    Planned proceedures: Colonoscopy and indicated procedures. I have discussed the risks benefits and complications of procedures to include not limited to bleeding, infection, perforation and the risk of sedation and the patient wishes to proceed.    Lollie Sails, MD Gastroenterology 01/26/2016  9:26 AM

## 2016-01-27 ENCOUNTER — Encounter
Admission: RE | Disposition: A | Discharge status: Home / Self Care | Payer: Self-pay | Source: Ambulatory Visit | Attending: Gastroenterology

## 2016-01-27 ENCOUNTER — Encounter: Payer: Self-pay | Admitting: *Deleted

## 2016-01-27 ENCOUNTER — Ambulatory Visit: Payer: 59 | Admitting: Certified Registered Nurse Anesthetist

## 2016-01-27 ENCOUNTER — Ambulatory Visit
Admission: RE | Admit: 2016-01-27 | Discharge: 2016-01-27 | Disposition: A | Discharge status: Home / Self Care | Payer: 59 | Source: Ambulatory Visit | Attending: Gastroenterology | Admitting: Gastroenterology

## 2016-01-27 DIAGNOSIS — Z8673 Personal history of transient ischemic attack (TIA), and cerebral infarction without residual deficits: Secondary | ICD-10-CM | POA: Diagnosis not present

## 2016-01-27 DIAGNOSIS — E11319 Type 2 diabetes mellitus with unspecified diabetic retinopathy without macular edema: Secondary | ICD-10-CM | POA: Insufficient documentation

## 2016-01-27 DIAGNOSIS — I252 Old myocardial infarction: Secondary | ICD-10-CM | POA: Insufficient documentation

## 2016-01-27 DIAGNOSIS — I2510 Atherosclerotic heart disease of native coronary artery without angina pectoris: Secondary | ICD-10-CM | POA: Diagnosis not present

## 2016-01-27 DIAGNOSIS — E669 Obesity, unspecified: Secondary | ICD-10-CM | POA: Diagnosis not present

## 2016-01-27 DIAGNOSIS — E1140 Type 2 diabetes mellitus with diabetic neuropathy, unspecified: Secondary | ICD-10-CM | POA: Insufficient documentation

## 2016-01-27 DIAGNOSIS — K5730 Diverticulosis of large intestine without perforation or abscess without bleeding: Secondary | ICD-10-CM | POA: Insufficient documentation

## 2016-01-27 DIAGNOSIS — E1122 Type 2 diabetes mellitus with diabetic chronic kidney disease: Secondary | ICD-10-CM | POA: Insufficient documentation

## 2016-01-27 DIAGNOSIS — Z79899 Other long term (current) drug therapy: Secondary | ICD-10-CM | POA: Insufficient documentation

## 2016-01-27 DIAGNOSIS — Z794 Long term (current) use of insulin: Secondary | ICD-10-CM | POA: Insufficient documentation

## 2016-01-27 DIAGNOSIS — H548 Legal blindness, as defined in USA: Secondary | ICD-10-CM | POA: Diagnosis not present

## 2016-01-27 DIAGNOSIS — I129 Hypertensive chronic kidney disease with stage 1 through stage 4 chronic kidney disease, or unspecified chronic kidney disease: Secondary | ICD-10-CM | POA: Insufficient documentation

## 2016-01-27 DIAGNOSIS — Z7982 Long term (current) use of aspirin: Secondary | ICD-10-CM | POA: Insufficient documentation

## 2016-01-27 DIAGNOSIS — N189 Chronic kidney disease, unspecified: Secondary | ICD-10-CM | POA: Insufficient documentation

## 2016-01-27 DIAGNOSIS — Z6836 Body mass index (BMI) 36.0-36.9, adult: Secondary | ICD-10-CM | POA: Diagnosis not present

## 2016-01-27 DIAGNOSIS — E785 Hyperlipidemia, unspecified: Secondary | ICD-10-CM | POA: Diagnosis not present

## 2016-01-27 DIAGNOSIS — D123 Benign neoplasm of transverse colon: Secondary | ICD-10-CM | POA: Insufficient documentation

## 2016-01-27 DIAGNOSIS — Z1211 Encounter for screening for malignant neoplasm of colon: Secondary | ICD-10-CM | POA: Diagnosis present

## 2016-01-27 HISTORY — PX: COLONOSCOPY WITH PROPOFOL: SHX5780

## 2016-01-27 LAB — GLUCOSE, CAPILLARY: Glucose-Capillary: 96 mg/dL (ref 65–99)

## 2016-01-27 SURGERY — COLONOSCOPY WITH PROPOFOL
Anesthesia: General

## 2016-01-27 MED ORDER — SODIUM CHLORIDE 0.9 % IV SOLN: INTRAVENOUS | Status: DC

## 2016-01-27 MED ORDER — EPHEDRINE SULFATE 50 MG/ML IJ SOLN
INTRAMUSCULAR | Status: DC | PRN
  Administered 2016-01-27 (×2): 5 mg via INTRAVENOUS

## 2016-01-27 MED ORDER — PROPOFOL 500 MG/50ML IV EMUL
INTRAVENOUS | Status: DC | PRN
  Administered 2016-01-27: 100 ug/kg/min via INTRAVENOUS

## 2016-01-27 MED ORDER — LIDOCAINE HCL (CARDIAC) 20 MG/ML IV SOLN
INTRAVENOUS | Status: DC | PRN
  Administered 2016-01-27: 100 mg via INTRAVENOUS

## 2016-01-27 MED ORDER — PROPOFOL 10 MG/ML IV BOLUS
INTRAVENOUS | Status: DC | PRN
  Administered 2016-01-27: 20 mg via INTRAVENOUS
  Administered 2016-01-27: 100 mg via INTRAVENOUS

## 2016-01-27 MED ORDER — FENTANYL CITRATE (PF) 100 MCG/2ML IJ SOLN
INTRAMUSCULAR | Status: DC | PRN
  Administered 2016-01-27: 50 ug via INTRAVENOUS

## 2016-01-27 MED ORDER — MIDAZOLAM HCL 5 MG/5ML IJ SOLN
INTRAMUSCULAR | Status: DC | PRN
  Administered 2016-01-27: 1 mg via INTRAVENOUS

## 2016-01-27 MED ORDER — SODIUM CHLORIDE 0.9 % IV SOLN
INTRAVENOUS | Status: DC
  Administered 2016-01-27: 10:00:00 via INTRAVENOUS

## 2016-01-27 NOTE — Anesthesia Postprocedure Evaluation (Signed)
Anesthesia Post Note  Patient: Sharon Arias  Procedure(s) Performed: Procedure(s) (LRB): COLONOSCOPY WITH PROPOFOL (N/A)  Patient location during evaluation: PACU Anesthesia Type: General Level of consciousness: awake Pain management: satisfactory to patient Vital Signs Assessment: post-procedure vital signs reviewed and stable Respiratory status: nonlabored ventilation Cardiovascular status: stable Anesthetic complications: no    Last Vitals:  Filed Vitals:   01/27/16 1054 01/27/16 1104  BP: 149/54 135/54  Pulse: 57 57  Temp:    Resp: 12 11    Last Pain: There were no vitals filed for this visit.               VAN STAVEREN,Ashwini Jago

## 2016-01-27 NOTE — Op Note (Signed)
[facility] Gastroenterology Patient Name: Sharon Arias Procedure Date: 01/27/2016 9:39 AM MRN: [id number] Account #: [id number] Date of Birth: 28-Aug-1961 Admit Type: Outpatient Age: 55 Room: [facility] ENDO ROOM 3 Gender: Female Note Status: Finalized Procedure:         Colonoscopy Indications:       Screening for colorectal malignant neoplasm Providers:         Lollie Sails, MD Referring MD:      Leonie Douglas. Doy Hutching, MD (Referring MD) Medicines:         Monitored Anesthesia Care Complications:     No immediate complications. Procedure:         Pre-Anesthesia Assessment:                    - ASA Grade Assessment: III - A patient with severe                     systemic disease.                    After obtaining informed consent, the colonoscope was                     passed under direct vision. Throughout the procedure, the                     patient's blood pressure, pulse, and oxygen saturations                     were monitored continuously. The Colonoscope was                     introduced through the anus and advanced to the the cecum,                     identified by appendiceal orifice and ileocecal valve. The                     colonoscopy was performed without difficulty. The patient                     tolerated the procedure well. The quality of the bowel                     preparation was fair. Findings:      A few small-mouthed diverticula were found in the sigmoid colon.      A 10 mm polyp was found at the splenic flexure. The polyp was       pedunculated. The polyp was removed with a hot snare. Resection and       retrieval were complete. To prevent bleeding after the polypectomy, one       hemostatic clip was successfully placed. There was no bleeding at the       end of the maneuver.      A 4 mm polyp was found in the distal transverse colon. The polyp was       sessile. The polyp was removed with a cold snare. Resection and   retrieval were complete. To prevent bleeding after the polypectomy, one       hemostatic clip was successfully placed. There was no bleeding at the       end of the maneuver.      A 2 mm polyp was found at the hepatic flexure. The polyp was sessile.  The polyp was removed with a cold biopsy forceps. Resection and       retrieval were complete.      A 2 mm polyp was found in the distal transverse colon. The polyp was       sessile. The polyp was removed with a cold biopsy forceps. Resection and       retrieval were complete.      The retroflexed view of the distal rectum and anal verge was normal and       showed no anal or rectal abnormalities.      The digital rectal exam was normal. Impression:        - Diverticulosis in the sigmoid colon.                    - One 10 mm polyp at the splenic flexure. Resected and                     retrieved. Clip was placed.                    - One 4 mm polyp in the distal transverse colon. Resected                     and retrieved. Clip was placed.                    - One 2 mm polyp at the hepatic flexure. Resected and                     retrieved.                    - One 2 mm polyp in the distal transverse colon. Resected                     and retrieved.                    - The distal rectum and anal verge are normal on                     retroflexion view. Recommendation:    - Discharge patient to home.                    - Await pathology results.                    - Telephone GI clinic for pathology results in 1 week. Procedure Code(s): --- Professional ---                    [id number], Colonoscopy, flexible; with removal of tumor(s),                     polyp(s), or other lesion(s) by snare technique                    45380, 68, Colonoscopy, flexible; with biopsy, single or                     multiple Diagnosis Code(s): --- Professional ---                    Z12.11, Encounter for screening for malignant neoplasm of  colon                    D12.3, Benign neoplasm of transverse colon                    K57.30, Diverticulosis of large intestine without                     perforation or abscess without bleeding CPT copyright 2014 American Medical Association. All rights reserved. The codes documented in this report are preliminary and upon coder review may  be revised to meet current compliance requirements. Lollie Sails, MD 01/27/2016 10:30:46 AM This report has been signed electronically. Number of Addenda: 0 Note Initiated On: 01/27/2016 9:39 AM Scope Withdrawal Time: 0 hours 16 minutes 52 seconds  Total Procedure Duration: 0 hours 41 minutes 38 seconds       [facility]

## 2016-01-27 NOTE — Transfer of Care (Signed)
Immediate Anesthesia Transfer of Care Note  Patient: Sharon Arias  Procedure(s) Performed: Procedure(s): COLONOSCOPY WITH PROPOFOL (N/A)  Patient Location: PACU  Anesthesia Type:General  Level of Consciousness: awake, alert , oriented and patient cooperative  Airway & Oxygen Therapy: Patient Spontanous Breathing and Patient connected to nasal cannula oxygen  Post-op Assessment: Report given to RN and Post -op Vital signs reviewed and stable  Post vital signs: Reviewed and stable  Last Vitals:  Filed Vitals:   01/27/16 1034 01/27/16 1035  BP: 111/57 111/57  Pulse: 61 64  Temp: 36 C 36 C  Resp: 15 12    Complications: No apparent anesthesia complications

## 2016-01-27 NOTE — Anesthesia Preprocedure Evaluation (Signed)
Anesthesia Evaluation  Patient identified by MRN, date of birth, ID band Patient awake    Reviewed: Allergy & Precautions, NPO status , Patient's Chart, lab work & pertinent test results  History of Anesthesia Complications (+) PONV  Airway Mallampati: III       Dental no notable dental hx.    Pulmonary neg pulmonary ROS,     + decreased breath sounds      Cardiovascular Exercise Tolerance: Good hypertension, Pt. on home beta blockers + CAD and + Past MI       Neuro/Psych CVA    GI/Hepatic negative GI ROS, Neg liver ROS,   Endo/Other  diabetes, Well Controlled, Type 1, Insulin DependentMorbid obesity  Renal/GU      Musculoskeletal   Abdominal (+) + obese,   Peds  Hematology   Anesthesia Other Findings   Reproductive/Obstetrics                             Anesthesia Physical Anesthesia Plan  ASA: III  Anesthesia Plan: General   Post-op Pain Management:    Induction: Intravenous  Airway Management Planned: Natural Airway and Nasal Cannula  Additional Equipment:   Intra-op Plan:   Post-operative Plan:   Informed Consent: I have reviewed the patients History and Physical, chart, labs and discussed the procedure including the risks, benefits and alternatives for the proposed anesthesia with the patient or authorized representative who has indicated his/her understanding and acceptance.     Plan Discussed with: CRNA  Anesthesia Plan Comments:         Anesthesia Quick Evaluation

## 2016-01-27 NOTE — H&P (Signed)
Outpatient short stay form Pre-procedure 01/27/2016 9:27 AM Sharon Sails MD  Primary Physician: Dr. Fulton Reek  Reason for visit:  screening colonoscopy  History of present illness:  Patient is a 55 year old female presenting today for screening colonoscopy. He attempted yesterday however prep was poor and required reprepping. He does take 81 mg aspirin. She takes no other aspirin or anticoagulation products. She tolerated her repeat prep well.    Current facility-administered medications:  .  0.9 %  sodium chloride infusion, , Intravenous, Continuous, Sharon Sails, MD  Prescriptions prior to admission  Medication Sig Dispense Refill Last Dose  . amLODipine (NORVASC) 10 MG tablet    01/26/2016 at Unknown time  . aspirin EC 81 MG tablet Take by mouth.   01/27/2016 at Unknown time  . atorvastatin (LIPITOR) 40 MG tablet Take by mouth.   01/26/2016 at Unknown time  . furosemide (LASIX) 40 MG tablet Take 1 tablet by mouth  daily   Past Week at Unknown time  . Insulin Glargine (LANTUS SOLOSTAR) 100 UNIT/ML Solostar Pen Inject subcutaneously 74  units at bedtime ( Split  into 2 shots of 37 units  each at 2 different sites )   01/26/2016 at Unknown time  . insulin lispro (HUMALOG KWIKPEN) 100 UNIT/ML KiwkPen Inject subcutaneously 22 units with breakfast,24 units with lunch,and 32 units with dinner, plus sliding scale as directed.   01/26/2016 at Unknown time  . losartan (COZAAR) 100 MG tablet    01/27/2016 at Unknown time  . metoprolol (LOPRESSOR) 50 MG tablet Take 1 tablet by mouth two  times daily   01/27/2016 at Unknown time     No Known Allergies   Past Medical History  Diagnosis Date  . Hyperlipidemia   . CAD (coronary artery disease)   . Sciatica of right side     going to see physiciatry  . Detached retina   . Chronic kidney disease     per dr sparks  . Chicken pox   . CVA (cerebral vascular accident) ([place])   . Diabetic retinopathy ([place])     legally blind  . Myocardial  infarction ([place])   . Hypertension   . PONV (postoperative nausea and vomiting)   . Obesity   . Diabetes mellitus without complication ([place])     type 2  . Diabetic neuropathy ([place])     Review of systems:      Physical Exam    Heart and lungs: Regular rate and rhythm without rub or gallop, lungs are bilaterally clear.    HEENT: Normocephalic atraumatic eyes are anicteric    Other:     Pertinant exam for procedure: Soft nontender nondistended bowel sounds positive normoactive.    Planned proceedures: Colonoscopy and indicated procedures. I have discussed the risks benefits and complications of procedures to include not limited to bleeding, infection, perforation and the risk of sedation and the patient wishes to proceed.    Sharon Sails, MD Gastroenterology 01/27/2016  9:27 AM

## 2016-01-28 LAB — SURGICAL PATHOLOGY

## 2016-02-18 ENCOUNTER — Inpatient Hospital Stay: Payer: 59 | Attending: Internal Medicine

## 2016-02-18 DIAGNOSIS — D472 Monoclonal gammopathy: Secondary | ICD-10-CM | POA: Insufficient documentation

## 2016-02-18 DIAGNOSIS — D649 Anemia, unspecified: Secondary | ICD-10-CM | POA: Diagnosis not present

## 2016-02-19 LAB — KAPPA/LAMBDA LIGHT CHAINS
Kappa free light chain: 74.13 mg/L — ABNORMAL HIGH (ref 3.30–19.40)
Kappa, lambda light chain ratio: 2.81 — ABNORMAL HIGH (ref 0.26–1.65)
Lambda free light chains: 26.42 mg/L — ABNORMAL HIGH (ref 5.71–26.30)

## 2016-05-12 ENCOUNTER — Inpatient Hospital Stay: Payer: 59 | Attending: Family Medicine

## 2016-05-12 DIAGNOSIS — I129 Hypertensive chronic kidney disease with stage 1 through stage 4 chronic kidney disease, or unspecified chronic kidney disease: Secondary | ICD-10-CM | POA: Insufficient documentation

## 2016-05-12 DIAGNOSIS — E785 Hyperlipidemia, unspecified: Secondary | ICD-10-CM | POA: Insufficient documentation

## 2016-05-12 DIAGNOSIS — N189 Chronic kidney disease, unspecified: Secondary | ICD-10-CM | POA: Insufficient documentation

## 2016-05-12 DIAGNOSIS — Z6839 Body mass index (BMI) 39.0-39.9, adult: Secondary | ICD-10-CM | POA: Diagnosis not present

## 2016-05-12 DIAGNOSIS — I252 Old myocardial infarction: Secondary | ICD-10-CM | POA: Diagnosis not present

## 2016-05-12 DIAGNOSIS — Z794 Long term (current) use of insulin: Secondary | ICD-10-CM | POA: Diagnosis not present

## 2016-05-12 DIAGNOSIS — E1140 Type 2 diabetes mellitus with diabetic neuropathy, unspecified: Secondary | ICD-10-CM | POA: Diagnosis not present

## 2016-05-12 DIAGNOSIS — Z8673 Personal history of transient ischemic attack (TIA), and cerebral infarction without residual deficits: Secondary | ICD-10-CM | POA: Diagnosis not present

## 2016-05-12 DIAGNOSIS — E1122 Type 2 diabetes mellitus with diabetic chronic kidney disease: Secondary | ICD-10-CM | POA: Diagnosis not present

## 2016-05-12 DIAGNOSIS — I2510 Atherosclerotic heart disease of native coronary artery without angina pectoris: Secondary | ICD-10-CM | POA: Diagnosis not present

## 2016-05-12 DIAGNOSIS — D472 Monoclonal gammopathy: Secondary | ICD-10-CM

## 2016-05-12 DIAGNOSIS — Z951 Presence of aortocoronary bypass graft: Secondary | ICD-10-CM | POA: Diagnosis not present

## 2016-05-12 DIAGNOSIS — Z79899 Other long term (current) drug therapy: Secondary | ICD-10-CM | POA: Insufficient documentation

## 2016-05-12 DIAGNOSIS — D631 Anemia in chronic kidney disease: Secondary | ICD-10-CM | POA: Diagnosis not present

## 2016-05-12 LAB — CBC WITH DIFFERENTIAL/PLATELET
Basophils Absolute: 0.1 10*3/uL (ref 0–0.1)
Basophils Relative: 1 %
Eosinophils Absolute: 0.4 10*3/uL (ref 0–0.7)
Eosinophils Relative: 4 %
HCT: 30.6 % — ABNORMAL LOW (ref 35.0–47.0)
HEMOGLOBIN: 10.7 g/dL — AB (ref 12.0–16.0)
LYMPHS ABS: 2.7 10*3/uL (ref 1.0–3.6)
LYMPHS PCT: 30 %
MCH: 29.3 pg (ref 26.0–34.0)
MCHC: 34.9 g/dL (ref 32.0–36.0)
MCV: 84 fL (ref 80.0–100.0)
MONOS PCT: 8 %
Monocytes Absolute: 0.8 10*3/uL (ref 0.2–0.9)
NEUTROS PCT: 57 %
Neutro Abs: 5.2 10*3/uL (ref 1.4–6.5)
Platelets: 301 10*3/uL (ref 150–440)
RBC: 3.64 MIL/uL — AB (ref 3.80–5.20)
RDW: 14.3 % (ref 11.5–14.5)
WBC: 9.2 10*3/uL (ref 3.6–11.0)

## 2016-05-12 LAB — COMPREHENSIVE METABOLIC PANEL
ALK PHOS: 47 U/L (ref 38–126)
ALT: 32 U/L (ref 14–54)
AST: 24 U/L (ref 15–41)
Albumin: 3.9 g/dL (ref 3.5–5.0)
Anion gap: 9 (ref 5–15)
BILIRUBIN TOTAL: 0.4 mg/dL (ref 0.3–1.2)
BUN: 52 mg/dL — ABNORMAL HIGH (ref 6–20)
CALCIUM: 9.3 mg/dL (ref 8.9–10.3)
CO2: 27 mmol/L (ref 22–32)
CREATININE: 1.75 mg/dL — AB (ref 0.44–1.00)
Chloride: 108 mmol/L (ref 101–111)
GFR calc Af Amer: 37 mL/min — ABNORMAL LOW (ref >60)
GFR, EST NON AFRICAN AMERICAN: 32 mL/min — AB (ref >60)
Glucose, Bld: 115 mg/dL — ABNORMAL HIGH (ref 65–99)
Potassium: 3.9 mmol/L (ref 3.5–5.1)
Sodium: 144 mmol/L (ref 135–145)
TOTAL PROTEIN: 7.5 g/dL (ref 6.5–8.1)

## 2016-05-13 LAB — KAPPA/LAMBDA LIGHT CHAINS
Kappa free light chain: 52.68 mg/L — ABNORMAL HIGH (ref 3.30–19.40)
Kappa, lambda light chain ratio: 2.42 — ABNORMAL HIGH (ref 0.26–1.65)
Lambda free light chains: 21.8 mg/L (ref 5.71–26.30)

## 2016-05-19 ENCOUNTER — Encounter: Payer: Self-pay | Admitting: Family Medicine

## 2016-05-19 ENCOUNTER — Inpatient Hospital Stay: Payer: 59 | Admitting: Family Medicine

## 2016-05-19 ENCOUNTER — Inpatient Hospital Stay (HOSPITAL_BASED_OUTPATIENT_CLINIC_OR_DEPARTMENT_OTHER): Payer: 59 | Admitting: Family Medicine

## 2016-05-19 VITALS — BP 157/66 | HR 66 | Temp 97.9°F | Resp 17 | Ht 68.0 in | Wt 258.5 lb

## 2016-05-19 DIAGNOSIS — D472 Monoclonal gammopathy: Secondary | ICD-10-CM

## 2016-05-19 DIAGNOSIS — Z794 Long term (current) use of insulin: Secondary | ICD-10-CM

## 2016-05-19 DIAGNOSIS — I2510 Atherosclerotic heart disease of native coronary artery without angina pectoris: Secondary | ICD-10-CM

## 2016-05-19 DIAGNOSIS — N189 Chronic kidney disease, unspecified: Secondary | ICD-10-CM

## 2016-05-19 DIAGNOSIS — Z79899 Other long term (current) drug therapy: Secondary | ICD-10-CM

## 2016-05-19 DIAGNOSIS — D631 Anemia in chronic kidney disease: Secondary | ICD-10-CM

## 2016-05-19 DIAGNOSIS — E1122 Type 2 diabetes mellitus with diabetic chronic kidney disease: Secondary | ICD-10-CM | POA: Diagnosis not present

## 2016-05-19 DIAGNOSIS — E1140 Type 2 diabetes mellitus with diabetic neuropathy, unspecified: Secondary | ICD-10-CM

## 2016-05-19 DIAGNOSIS — I129 Hypertensive chronic kidney disease with stage 1 through stage 4 chronic kidney disease, or unspecified chronic kidney disease: Secondary | ICD-10-CM | POA: Diagnosis not present

## 2016-05-19 DIAGNOSIS — N184 Chronic kidney disease, stage 4 (severe): Secondary | ICD-10-CM | POA: Insufficient documentation

## 2016-05-19 DIAGNOSIS — E785 Hyperlipidemia, unspecified: Secondary | ICD-10-CM

## 2016-05-19 DIAGNOSIS — I252 Old myocardial infarction: Secondary | ICD-10-CM

## 2016-05-19 HISTORY — DX: Anemia in chronic kidney disease: D63.1

## 2016-05-19 HISTORY — DX: Chronic kidney disease, unspecified: N18.9

## 2016-05-19 NOTE — Progress Notes (Signed)
Visually impaired.  No changes since last visit.  No concerns

## 2016-05-19 NOTE — Progress Notes (Signed)
[place] @ [facility] [phone]  Fax:([phone]) [phone]     [name] OB: 08-02-1961  MR#: [id number]  ZMO#:[id number]  Patient Care Team: Idelle Crouch, MD as PCP - General (Internal Medicine)  CHIEF COMPLAINT:  Chief Complaint  Patient presents with  . Follow-up    Anemia    HISTORY OF PRESENT ILLNESS:   Sharon Arias is a 55 year old female who returns for evaluation of anemia. Review of medical records and past medical history suggests that she has had moderate anemia since at least 2013 with 2014. Her hemoglobin has been hovering around 10 g/dL since then so far there has not been as special last workup performed in order to elucidate the reason for the anemia. Sharon Arias has had a poorly controlled diabetes for a long period of time, which led to development of mild renal insufficiency, significant coronary artery disease which required CABG surgery in 2013, as well as retinopathy and retinal detachment. She claims to follow healthy diet with sufficient amount of vegetables, including leafy vegetables. She is not a vegetarian, however.   Current status: Patient reports overall feeling very well. She denies any acute complaints. Reports that her diet has been better and her diabetes has been under better control. She had her blood drawn last week and is here for results.  REVIEW OF SYSTEMS:   Review of Systems  All other systems reviewed and are negative.    PAST MEDICAL HISTORY: Past Medical History  Diagnosis Date  . Hyperlipidemia   . CAD (coronary artery disease)   . Sciatica of right side     going to see physiciatry  . Detached retina   . Chronic kidney disease     per dr sparks  . Chicken pox   . CVA (cerebral vascular accident) ([place])   . Diabetic retinopathy ([place])     legally blind  . Myocardial infarction ([place])   . Hypertension   . PONV (postoperative nausea and vomiting)   . Obesity   . Diabetes mellitus without complication ([place])     type 2    . Diabetic neuropathy ([place])   . Anemia in chronic kidney disease 05/19/2016    PAST SURGICAL HISTORY: Past Surgical History  Procedure Laterality Date  . Coronary artery bypass graft  2013  . Retinal laser procedure    . Incision and drainage      chest abscess  . Colonoscopy with propofol N/A 01/26/2016    Procedure: COLONOSCOPY WITH PROPOFOL;  Surgeon: Lollie Sails, MD;  Location: [facility] ENDOSCOPY;  Service: Endoscopy;  Laterality: N/A;  . Colonoscopy with propofol N/A 01/27/2016    Procedure: COLONOSCOPY WITH PROPOFOL;  Surgeon: Lollie Sails, MD;  Location: [facility] ENDOSCOPY;  Service: Endoscopy;  Laterality: N/A;    FAMILY HISTORY Family History  Problem Relation Age of Onset  . Breast cancer Mother   . Prostate cancer Father     we think mets to liver and bone    ADVANCED DIRECTIVES:  No flowsheet data found.  HEALTH MAINTENANCE: Social History  Substance Use Topics  . Smoking status: Never Smoker   . Smokeless tobacco: Never Used  . Alcohol Use: No     No Known Allergies  Current Outpatient Prescriptions  Medication Sig Dispense Refill  . amLODipine (NORVASC) 10 MG tablet     . aspirin EC 81 MG tablet Take by mouth.    [name] atorvastatin (LIPITOR) 40 MG tablet Take by mouth.    . furosemide (LASIX) 40  MG tablet Take 1 tablet by mouth  daily    . Insulin Glargine (LANTUS SOLOSTAR) 100 UNIT/ML Solostar Pen Inject subcutaneously 74  units at bedtime ( Split  into 2 shots of 37 units  each at 2 different sites )    . insulin lispro (HUMALOG KWIKPEN) 100 UNIT/ML KiwkPen Inject subcutaneously 22 units with breakfast,24 units with lunch,and 32 units with dinner, plus sliding scale as directed.    [name] losartan (COZAAR) 100 MG tablet     . metoprolol (LOPRESSOR) 50 MG tablet Take 1 tablet by mouth two  times daily     No current facility-administered medications for this visit.    OBJECTIVE:  Filed Vitals:   05/19/16 1423  BP: 157/66  Pulse: 66  Temp: 97.9 F (36.6  C)  Resp: 17     Body mass index is 39.31 kg/(m^2).    ECOG FS:1 - Symptomatic but completely ambulatory  Physical Exam  Constitutional: She is oriented to person, place, and time and well-developed, well-nourished, and in no distress. No distress.  Morbidly obese Caucasian female (Body mass index is 39.31 kg/(m^2).  Head: Normocephalic and atraumatic.  Mouth/Throat: Oropharynx is clear and moist. .  Neck: Normal range of motion. Neck supple. No JVD present. No tracheal deviation present. No thyromegaly present.  Cardiovascular: Normal rate, regular rhythm, normal heart sounds and intact distal pulses.  Exam reveals no gallop and no friction rub.  No murmur heard. Pulmonary/Chest: Effort normal and breath sounds normal. No stridor. No respiratory distress. She has no wheezes. She has no rales. She exhibits no tenderness.  Musculoskeletal: Normal range of motion. She exhibits no edema or tenderness.  Lymphadenopathy: She has no cervical adenopathy.  Neurological: She is alert and oriented to person, place, and time. She has normal reflexes. No cranial nerve deficit. She exhibits normal muscle tone. Gait normal. Coordination normal. GCS score is 15.  Skin: Skin is warm. No rash noted. She is not diaphoretic. No erythema. No pallor.  Psychiatric: Mood, memory, affect and judgment normal.  Vitals reviewed.    LAB RESULTS:  Recent Results (from the past 2160 hour(s))  Kappa/lambda light chains     Status: Abnormal   Collection Time: 05/12/16  8:24 AM  Result Value Ref Range   Kappa free light chain 52.68 (H) 3.30 - 19.40 mg/L    Comment: (NOTE) **Effective May 24, 2016 Free Kappa Lt Chains,S**  reference interval will be changing to:    3.3 - 19.4    Lamda free light chains 21.80 5.71 - 26.30 mg/L    Comment: (NOTE) **Effective May 24, 2016 Free Lambda Lt Chains,S**  reference interval will be changing to:    5.7 - 26.3    Kappa, lamda light chain ratio 2.42 (H) 0.26 - 1.65     Comment: (NOTE) Performed At: [facility] [place], [place] [id number] Lindon Romp MD QT:[id number]   CBC with Differential/Platelet     Status: Abnormal   Collection Time: 05/12/16  8:24 AM  Result Value Ref Range   WBC 9.2 3.6 - 11.0 K/uL   RBC 3.64 (L) 3.80 - 5.20 MIL/uL   Hemoglobin 10.7 (L) 12.0 - 16.0 g/dL   HCT 30.6 (L) 35.0 - 47.0 %   MCV 84.0 80.0 - 100.0 fL   MCH 29.3 26.0 - 34.0 pg   MCHC 34.9 32.0 - 36.0 g/dL   RDW 14.3 11.5 - 14.5 %   Platelets 301 150 - 440 K/uL  Neutrophils Relative % 57 %   Neutro Abs 5.2 1.4 - 6.5 K/uL   Lymphocytes Relative 30 %   Lymphs Abs 2.7 1.0 - 3.6 K/uL   Monocytes Relative 8 %   Monocytes Absolute 0.8 0.2 - 0.9 K/uL   Eosinophils Relative 4 %   Eosinophils Absolute 0.4 0 - 0.7 K/uL   Basophils Relative 1 %   Basophils Absolute 0.1 0 - 0.1 K/uL  Comprehensive metabolic panel     Status: Abnormal   Collection Time: 05/12/16  8:24 AM  Result Value Ref Range   Sodium 144 135 - 145 mmol/L   Potassium 3.9 3.5 - 5.1 mmol/L   Chloride 108 101 - 111 mmol/L   CO2 27 22 - 32 mmol/L   Glucose, Bld 115 (H) 65 - 99 mg/dL   BUN 52 (H) 6 - 20 mg/dL   Creatinine, Ser 1.75 (H) 0.44 - 1.00 mg/dL   Calcium 9.3 8.9 - 10.3 mg/dL   Total Protein 7.5 6.5 - 8.1 g/dL   Albumin 3.9 3.5 - 5.0 g/dL   AST 24 15 - 41 U/L   ALT 32 14 - 54 U/L   Alkaline Phosphatase 47 38 - 126 U/L   Total Bilirubin 0.4 0.3 - 1.2 mg/dL   GFR calc non Af Amer 32 (L) >60 mL/min   GFR calc Af Amer 37 (L) >60 mL/min    Comment: (NOTE) The eGFR has been calculated using the CKD EPI equation. This calculation has not been validated in all clinical situations. eGFR's persistently <60 mL/min signify possible Chronic Kidney Disease.    Anion gap 9 5 - 15     STUDIES: No results found.  ASSESSMENT AND PLAN: 1. Anemia- extensive workup showed no clear evidence of vitamin deficiency or iron deficiency. Possibly, anemia is related to renal  insufficiency. Anemia is mild at this point, so we will continue to observe. 2. Renal insufficiency- likely secondary to poorly controlled Diabetes. 3. Diabetes- Ms. Cleaver had a long history of poorly controlled diabetes, however, since undergoing CABG surgery she has made significant improvement in her lifestyle and dietary habits. Her blood sugars are much better controlled him a and her hemoglobin A1c is closer to the target range, then before. She, however, realizes that she needs to increase her physical activity, in order to improve glucose control and bring hemoglobin A1c firmly into the desired range. 4. Free kappa light chain monoclonal gammopathy-the exact significance is unclear, since kappa to lambda light chain abnormality can be seen in patients with renal insufficiency. Alternatively, such abnormality could be a sign of multiple myeloma/systemic amyloidosis with secondary renal involvement. Patient did have a multiple myeloma panel performed last in November 2016 with no M spike present. Free light chains remain stable at this time. After discussion with Dr. Rogue Bussing, we will continue with routine follow-up in approximately 4 months. We'll reevaluate light chains, multiple myeloma panel, iron/TIBC, and ferritin at that time.  Patient expressed understanding and was in agreement with this plan. She also understands that She can call clinic at any time with any questions, concerns, or complaints.   Dr. Rogue Bussing was available for consultation and review of plan of care for this patient.   Evlyn Kanner, NP   05/19/2016 3:27 PM

## 2016-09-08 ENCOUNTER — Inpatient Hospital Stay: Payer: 59 | Attending: Internal Medicine

## 2016-09-08 DIAGNOSIS — D631 Anemia in chronic kidney disease: Secondary | ICD-10-CM | POA: Insufficient documentation

## 2016-09-08 DIAGNOSIS — N189 Chronic kidney disease, unspecified: Secondary | ICD-10-CM | POA: Insufficient documentation

## 2016-09-08 DIAGNOSIS — I129 Hypertensive chronic kidney disease with stage 1 through stage 4 chronic kidney disease, or unspecified chronic kidney disease: Secondary | ICD-10-CM | POA: Insufficient documentation

## 2016-09-08 LAB — CBC WITH DIFFERENTIAL/PLATELET
BASOS ABS: 0.1 10*3/uL (ref 0–0.1)
BASOS PCT: 1 %
EOS PCT: 3 %
Eosinophils Absolute: 0.3 10*3/uL (ref 0–0.7)
HEMATOCRIT: 32.9 % — AB (ref 35.0–47.0)
Hemoglobin: 11.3 g/dL — ABNORMAL LOW (ref 12.0–16.0)
LYMPHS PCT: 31 %
Lymphs Abs: 3.1 10*3/uL (ref 1.0–3.6)
MCH: 29.3 pg (ref 26.0–34.0)
MCHC: 34.4 g/dL (ref 32.0–36.0)
MCV: 85.1 fL (ref 80.0–100.0)
Monocytes Absolute: 0.7 10*3/uL (ref 0.2–0.9)
Monocytes Relative: 7 %
NEUTROS ABS: 5.7 10*3/uL (ref 1.4–6.5)
Neutrophils Relative %: 58 %
PLATELETS: 315 10*3/uL (ref 150–440)
RBC: 3.86 MIL/uL (ref 3.80–5.20)
RDW: 14 % (ref 11.5–14.5)
WBC: 9.9 10*3/uL (ref 3.6–11.0)

## 2016-09-08 LAB — COMPREHENSIVE METABOLIC PANEL
ALBUMIN: 4.5 g/dL (ref 3.5–5.0)
ALT: 38 U/L (ref 14–54)
AST: 26 U/L (ref 15–41)
Alkaline Phosphatase: 55 U/L (ref 38–126)
Anion gap: 9 (ref 5–15)
BUN: 65 mg/dL — AB (ref 6–20)
CHLORIDE: 106 mmol/L (ref 101–111)
CO2: 24 mmol/L (ref 22–32)
CREATININE: 2.13 mg/dL — AB (ref 0.44–1.00)
Calcium: 9.4 mg/dL (ref 8.9–10.3)
GFR calc Af Amer: 29 mL/min — ABNORMAL LOW (ref >60)
GFR calc non Af Amer: 25 mL/min — ABNORMAL LOW (ref >60)
Glucose, Bld: 123 mg/dL — ABNORMAL HIGH (ref 65–99)
POTASSIUM: 3.7 mmol/L (ref 3.5–5.1)
SODIUM: 139 mmol/L (ref 135–145)
Total Bilirubin: 0.6 mg/dL (ref 0.3–1.2)
Total Protein: 8.3 g/dL — ABNORMAL HIGH (ref 6.5–8.1)

## 2016-09-08 LAB — IRON AND TIBC
Iron: 69 ug/dL (ref 28–170)
SATURATION RATIOS: 21 % (ref 10.4–31.8)
TIBC: 327 ug/dL (ref 250–450)
UIBC: 258 ug/dL

## 2016-09-08 LAB — FERRITIN: Ferritin: 179 ng/mL (ref 11–307)

## 2016-09-09 LAB — KAPPA/LAMBDA LIGHT CHAINS
Kappa free light chain: 52.4 mg/L — ABNORMAL HIGH (ref 3.3–19.4)
Kappa, lambda light chain ratio: 2.06 — ABNORMAL HIGH (ref 0.26–1.65)
Lambda free light chains: 25.4 mg/L (ref 5.7–26.3)

## 2016-09-13 LAB — MULTIPLE MYELOMA PANEL, SERUM
ALBUMIN/GLOB SERPL: 1 (ref 0.7–1.7)
ALPHA 1: 0.2 g/dL (ref 0.0–0.4)
Albumin SerPl Elph-Mcnc: 3.8 g/dL (ref 2.9–4.4)
Alpha2 Glob SerPl Elph-Mcnc: 1.1 g/dL — ABNORMAL HIGH (ref 0.4–1.0)
B-GLOBULIN SERPL ELPH-MCNC: 1.3 g/dL (ref 0.7–1.3)
GAMMA GLOB SERPL ELPH-MCNC: 1.3 g/dL (ref 0.4–1.8)
GLOBULIN, TOTAL: 3.9 g/dL (ref 2.2–3.9)
IGG (IMMUNOGLOBIN G), SERUM: 1249 mg/dL (ref 700–1600)
IGM, SERUM: 49 mg/dL (ref 26–217)
IgA: 242 mg/dL (ref 87–352)
Total Protein ELP: 7.7 g/dL (ref 6.0–8.5)

## 2016-09-15 ENCOUNTER — Inpatient Hospital Stay: Payer: 59 | Admitting: Internal Medicine

## 2016-09-29 ENCOUNTER — Inpatient Hospital Stay: Payer: 59 | Attending: Hematology and Oncology | Admitting: Hematology and Oncology

## 2016-09-29 ENCOUNTER — Encounter: Payer: Self-pay | Admitting: Hematology and Oncology

## 2016-09-29 DIAGNOSIS — E669 Obesity, unspecified: Secondary | ICD-10-CM | POA: Diagnosis not present

## 2016-09-29 DIAGNOSIS — E631 Imbalance of constituents of food intake: Secondary | ICD-10-CM

## 2016-09-29 DIAGNOSIS — G8929 Other chronic pain: Secondary | ICD-10-CM

## 2016-09-29 DIAGNOSIS — D631 Anemia in chronic kidney disease: Secondary | ICD-10-CM | POA: Insufficient documentation

## 2016-09-29 DIAGNOSIS — Z794 Long term (current) use of insulin: Secondary | ICD-10-CM | POA: Diagnosis not present

## 2016-09-29 DIAGNOSIS — E1121 Type 2 diabetes mellitus with diabetic nephropathy: Secondary | ICD-10-CM | POA: Diagnosis not present

## 2016-09-29 DIAGNOSIS — E785 Hyperlipidemia, unspecified: Secondary | ICD-10-CM | POA: Diagnosis not present

## 2016-09-29 DIAGNOSIS — M1990 Unspecified osteoarthritis, unspecified site: Secondary | ICD-10-CM | POA: Insufficient documentation

## 2016-09-29 DIAGNOSIS — I2510 Atherosclerotic heart disease of native coronary artery without angina pectoris: Secondary | ICD-10-CM

## 2016-09-29 DIAGNOSIS — I252 Old myocardial infarction: Secondary | ICD-10-CM | POA: Diagnosis not present

## 2016-09-29 DIAGNOSIS — Z803 Family history of malignant neoplasm of breast: Secondary | ICD-10-CM | POA: Insufficient documentation

## 2016-09-29 DIAGNOSIS — Z8673 Personal history of transient ischemic attack (TIA), and cerebral infarction without residual deficits: Secondary | ICD-10-CM | POA: Diagnosis not present

## 2016-09-29 DIAGNOSIS — Z8042 Family history of malignant neoplasm of prostate: Secondary | ICD-10-CM | POA: Insufficient documentation

## 2016-09-29 DIAGNOSIS — E1122 Type 2 diabetes mellitus with diabetic chronic kidney disease: Secondary | ICD-10-CM

## 2016-09-29 DIAGNOSIS — I129 Hypertensive chronic kidney disease with stage 1 through stage 4 chronic kidney disease, or unspecified chronic kidney disease: Secondary | ICD-10-CM | POA: Diagnosis not present

## 2016-09-29 DIAGNOSIS — Z7982 Long term (current) use of aspirin: Secondary | ICD-10-CM

## 2016-09-29 DIAGNOSIS — Z79899 Other long term (current) drug therapy: Secondary | ICD-10-CM | POA: Diagnosis not present

## 2016-09-29 DIAGNOSIS — N184 Chronic kidney disease, stage 4 (severe): Secondary | ICD-10-CM | POA: Diagnosis not present

## 2016-09-29 NOTE — Assessment & Plan Note (Signed)
She has chronic joint pain and arthritis. She had skeletal survey a year ago which excluded bone disease/multiple myeloma. I recommend high-dose vitamin D supplements and close follow-up with nephrologist due to risk of osteodystrophy with chronic renal failure

## 2016-09-29 NOTE — Assessment & Plan Note (Signed)
This is likely anemia of chronic disease. The patient denies recent history of bleeding such as epistaxis, hematuria or hematochezia. She is asymptomatic from the anemia. We will observe for now.  We discussed briefly the role of ESA in the future if her hemoglobin dropped to less than 10. I recommend close follow-up with her primary care doctor and her nephrologist for monitoring. She does not need future follow-up here for this

## 2016-09-29 NOTE — Progress Notes (Signed)
[facility] FOLLOW-UP progress notes  Patient Care Team: Idelle Crouch, MD as PCP - General (Internal Medicine)  CHIEF COMPLAINTS/PURPOSE OF VISIT:  Chronic anemia, chronic kidney disease, elevated free light chain  HISTORY OF PRESENTING ILLNESS:  Tilda Burrow 55 y.o. female was transferred to my care after her prior physician has left.  I reviewed the patient's records extensive and collaborated the history with the patient. Summary of her history is as follows: This patient has significant major comorbidities with insulin-dependent requiring diabetes complicated by chronic renal failure, history of stroke, diabetic neuropathy, diabetic retinopathy and coronary artery disease status post CABG. She had workup due to history of progressive anemia. Her hematologist a year ago order extensive workup which showed no evidence of MGUS/multiple myeloma. The cause of the anemia, renal failure and elevated free light chains were likely related to diabetic nephropathy. She returns today for further follow-up with repeat blood work again. The patient have chronic degenerative joint pain, chronic back pain and arthritis pain. Her most recent hemoglobin A1c was 7%. She denies recent infection  MEDICAL HISTORY:  Past Medical History:  Diagnosis Date  . Anemia in chronic kidney disease 05/19/2016  . CAD (coronary artery disease)   . Chicken pox   . Chronic kidney disease    per dr sparks  . CVA (cerebral vascular accident) ([place])   . Detached retina   . Diabetes mellitus without complication ([place])    type 2  . Diabetic neuropathy ([place])   . Diabetic retinopathy ([place])    legally blind  . Hyperlipidemia   . Hypertension   . Myocardial infarction   . Obesity   . PONV (postoperative nausea and vomiting)   . Sciatica of right side    going to see physiciatry    SURGICAL HISTORY: Past Surgical History:  Procedure Laterality Date  . COLONOSCOPY WITH PROPOFOL N/A 01/26/2016    Procedure: COLONOSCOPY WITH PROPOFOL;  Surgeon: Lollie Sails, MD;  Location: [facility] ENDOSCOPY;  Service: Endoscopy;  Laterality: N/A;  . COLONOSCOPY WITH PROPOFOL N/A 01/27/2016   Procedure: COLONOSCOPY WITH PROPOFOL;  Surgeon: Lollie Sails, MD;  Location: [facility] ENDOSCOPY;  Service: Endoscopy;  Laterality: N/A;  . CORONARY ARTERY BYPASS GRAFT  2013  . INCISION AND DRAINAGE     chest abscess  . RETINAL LASER PROCEDURE      SOCIAL HISTORY: Social History   Social History  . Marital status: Married    Spouse name: N/A  . Number of children: N/A  . Years of education: N/A   Occupational History  . Not on file.   Social History Main Topics  . Smoking status: Never Smoker  . Smokeless tobacco: Never Used  . Alcohol use No  . Drug use: No  . Sexual activity: Not on file   Other Topics Concern  . Not on file   Social History Narrative  . No narrative on file    FAMILY HISTORY: Family History  Problem Relation Age of Onset  . Breast cancer Mother   . Prostate cancer Father     we think mets to liver and bone    ALLERGIES:  has No Known Allergies.  MEDICATIONS:  Current Outpatient Prescriptions  Medication Sig Dispense Refill  . amLODipine (NORVASC) 10 MG tablet     . aspirin EC 81 MG tablet Take by mouth.    [name] atorvastatin (LIPITOR) 40 MG tablet Take by mouth.    . furosemide (LASIX) 40 MG tablet Take 1  tablet by mouth  daily    . Insulin Glargine (LANTUS SOLOSTAR) 100 UNIT/ML Solostar Pen Inject subcutaneously 74  units at bedtime ( Split  into 2 shots of 37 units  each at 2 different sites )    . insulin lispro (HUMALOG KWIKPEN) 100 UNIT/ML KiwkPen Inject subcutaneously 22 units with breakfast,24 units with lunch,and 32 units with dinner, plus sliding scale as directed.    [name] losartan (COZAAR) 100 MG tablet     . metoprolol (LOPRESSOR) 50 MG tablet Take 1 tablet by mouth two  times daily     No current facility-administered medications for this visit.      REVIEW OF SYSTEMS:   Constitutional: Denies fevers, chills or abnormal night sweats Eyes: Denies blurriness of vision, double vision or watery eyes Ears, nose, mouth, throat, and face: Denies mucositis or sore throat Respiratory: Denies cough, dyspnea or wheezes Cardiovascular: Denies palpitation, chest discomfort or lower extremity swelling Gastrointestinal:  Denies nausea, heartburn or change in bowel habits Skin: Denies abnormal skin rashes Lymphatics: Denies new lymphadenopathy or easy bruising Neurological:Denies numbness, tingling or new weaknesses Behavioral/Psych: Mood is stable, no new changes  All other systems were reviewed with the patient and are negative.  PHYSICAL EXAMINATION: ECOG PERFORMANCE STATUS: 1 - Symptomatic but completely ambulatory  Vitals:   09/29/16 1117  BP: (!) 151/65  Pulse: 60  Resp: 18  Temp: 97.1 F (36.2 C)   Filed Weights   09/29/16 1117  Weight: 254 lb 1.3 oz (115.2 kg)    GENERAL:alert, no distress and comfortable. She is morbidly obese SKIN: skin color, texture, turgor are normal, no rashes or significant lesions EYES: normal, conjunctiva are pink and non-injected, sclera clear PSYCH: alert & oriented x 3 with fluent speech NEURO: no focal motor/sensory deficits  LABORATORY DATA:  I have reviewed the data as listed Lab Results  Component Value Date   WBC 9.9 09/08/2016   HGB 11.3 (L) 09/08/2016   HCT 32.9 (L) 09/08/2016   MCV 85.1 09/08/2016   PLT 315 09/08/2016    Recent Labs  11/05/15 1443 05/12/16 0824 09/08/16 0815  NA  --  144 139  K  --  3.9 3.7  CL  --  108 106  CO2  --  27 24  GLUCOSE  --  115* 123*  BUN  --  52* 65*  CREATININE 1.70* 1.75* 2.13*  CALCIUM  --  9.3 9.4  GFRNONAA 33* 32* 25*  GFRAA 38* 37* 29*  PROT  --  7.5 8.3*  ALBUMIN  --  3.9 4.5  AST  --  24 26  ALT  --  32 38  ALKPHOS  --  47 55  BILITOT  --  0.4 0.6    RADIOGRAPHIC STUDIES:I reviewed her skeletal survey I have personally  reviewed the radiological images as listed and agreed with the findings in the report.   ASSESSMENT & PLAN:  Anemia in chronic kidney disease This is likely anemia of chronic disease. The patient denies recent history of bleeding such as epistaxis, hematuria or hematochezia. She is asymptomatic from the anemia. We will observe for now.  We discussed briefly the role of ESA in the future if her hemoglobin dropped to less than 10. I recommend close follow-up with her primary care doctor and her nephrologist for monitoring. She does not need future follow-up here for this  CKD (chronic kidney disease), stage IV ([place]) She has poor kidney function related to poorly controlled diabetes. I recommend close  follow-up with her nephrologist. She had extensive workup in the past and I review all the recent blood work and recent imaging study with the patient. She does not have MGUS or multiple myeloma as a cause of her anemia or chronic kidney disease The elevated free light chains are related to renal failure She does not need future follow-up here or further workup from that standpoint  Chronic arthritis She has chronic joint pain and arthritis. She had skeletal survey a year ago which excluded bone disease/multiple myeloma. I recommend high-dose vitamin D supplements and close follow-up with nephrologist due to risk of osteodystrophy with chronic renal failure   No orders of the defined types were placed in this encounter.   All questions were answered. The patient knows to call the clinic with any problems, questions or concerns. I spent 15 minutes counseling the patient face to face. The total time spent in the appointment was 20 minutes and more than 50% was on counseling.     Heath Lark, MD 09/29/2016 11:53 AM

## 2016-09-29 NOTE — Assessment & Plan Note (Signed)
She has poor kidney function related to poorly controlled diabetes. I recommend close follow-up with her nephrologist. She had extensive workup in the past and I review all the recent blood work and recent imaging study with the patient. She does not have MGUS or multiple myeloma as a cause of her anemia or chronic kidney disease The elevated free light chains are related to renal failure She does not need future follow-up here or further workup from that standpoint

## 2017-01-04 DIAGNOSIS — E1142 Type 2 diabetes mellitus with diabetic polyneuropathy: Secondary | ICD-10-CM | POA: Diagnosis not present

## 2017-01-04 DIAGNOSIS — M79672 Pain in left foot: Secondary | ICD-10-CM | POA: Diagnosis not present

## 2017-01-04 DIAGNOSIS — N183 Chronic kidney disease, stage 3 (moderate): Secondary | ICD-10-CM | POA: Diagnosis not present

## 2017-01-04 DIAGNOSIS — M79671 Pain in right foot: Secondary | ICD-10-CM | POA: Diagnosis not present

## 2017-01-13 ENCOUNTER — Other Ambulatory Visit: Payer: Self-pay | Admitting: Internal Medicine

## 2017-01-13 DIAGNOSIS — Z1231 Encounter for screening mammogram for malignant neoplasm of breast: Secondary | ICD-10-CM

## 2017-01-18 DIAGNOSIS — R110 Nausea: Secondary | ICD-10-CM | POA: Diagnosis not present

## 2017-01-18 DIAGNOSIS — Z951 Presence of aortocoronary bypass graft: Secondary | ICD-10-CM | POA: Diagnosis not present

## 2017-01-18 DIAGNOSIS — R0602 Shortness of breath: Secondary | ICD-10-CM | POA: Diagnosis not present

## 2017-01-18 DIAGNOSIS — R002 Palpitations: Secondary | ICD-10-CM | POA: Diagnosis not present

## 2017-01-19 ENCOUNTER — Ambulatory Visit: Payer: Medicare Other

## 2017-01-19 DIAGNOSIS — R9439 Abnormal result of other cardiovascular function study: Secondary | ICD-10-CM | POA: Insufficient documentation

## 2017-01-19 DIAGNOSIS — R0789 Other chest pain: Secondary | ICD-10-CM | POA: Diagnosis not present

## 2017-01-19 DIAGNOSIS — I2510 Atherosclerotic heart disease of native coronary artery without angina pectoris: Secondary | ICD-10-CM | POA: Diagnosis not present

## 2017-01-20 DIAGNOSIS — R0789 Other chest pain: Secondary | ICD-10-CM | POA: Diagnosis not present

## 2017-01-20 DIAGNOSIS — Z951 Presence of aortocoronary bypass graft: Secondary | ICD-10-CM | POA: Diagnosis not present

## 2017-01-20 DIAGNOSIS — I2510 Atherosclerotic heart disease of native coronary artery without angina pectoris: Secondary | ICD-10-CM | POA: Diagnosis not present

## 2017-01-20 DIAGNOSIS — I25790 Atherosclerosis of other coronary artery bypass graft(s) with unstable angina pectoris: Secondary | ICD-10-CM | POA: Diagnosis not present

## 2017-01-20 DIAGNOSIS — I25110 Atherosclerotic heart disease of native coronary artery with unstable angina pectoris: Secondary | ICD-10-CM | POA: Diagnosis not present

## 2017-01-20 DIAGNOSIS — R9439 Abnormal result of other cardiovascular function study: Secondary | ICD-10-CM | POA: Diagnosis not present

## 2017-01-21 DIAGNOSIS — E1122 Type 2 diabetes mellitus with diabetic chronic kidney disease: Secondary | ICD-10-CM | POA: Diagnosis not present

## 2017-01-21 DIAGNOSIS — R0789 Other chest pain: Secondary | ICD-10-CM | POA: Diagnosis not present

## 2017-01-21 DIAGNOSIS — I2510 Atherosclerotic heart disease of native coronary artery without angina pectoris: Secondary | ICD-10-CM | POA: Diagnosis not present

## 2017-01-21 DIAGNOSIS — R9439 Abnormal result of other cardiovascular function study: Secondary | ICD-10-CM | POA: Diagnosis not present

## 2017-01-21 DIAGNOSIS — I25110 Atherosclerotic heart disease of native coronary artery with unstable angina pectoris: Secondary | ICD-10-CM | POA: Diagnosis not present

## 2017-01-21 DIAGNOSIS — N179 Acute kidney failure, unspecified: Secondary | ICD-10-CM | POA: Diagnosis not present

## 2017-01-25 DIAGNOSIS — R946 Abnormal results of thyroid function studies: Secondary | ICD-10-CM | POA: Diagnosis not present

## 2017-01-27 DIAGNOSIS — I10 Essential (primary) hypertension: Secondary | ICD-10-CM | POA: Diagnosis not present

## 2017-01-27 DIAGNOSIS — I25810 Atherosclerosis of coronary artery bypass graft(s) without angina pectoris: Secondary | ICD-10-CM | POA: Diagnosis not present

## 2017-01-27 DIAGNOSIS — I214 Non-ST elevation (NSTEMI) myocardial infarction: Secondary | ICD-10-CM | POA: Diagnosis not present

## 2017-02-10 DIAGNOSIS — M79671 Pain in right foot: Secondary | ICD-10-CM | POA: Diagnosis not present

## 2017-02-10 DIAGNOSIS — I10 Essential (primary) hypertension: Secondary | ICD-10-CM | POA: Diagnosis not present

## 2017-02-10 DIAGNOSIS — E1142 Type 2 diabetes mellitus with diabetic polyneuropathy: Secondary | ICD-10-CM | POA: Diagnosis not present

## 2017-02-10 DIAGNOSIS — R946 Abnormal results of thyroid function studies: Secondary | ICD-10-CM | POA: Diagnosis not present

## 2017-02-10 DIAGNOSIS — Z79899 Other long term (current) drug therapy: Secondary | ICD-10-CM | POA: Diagnosis not present

## 2017-02-10 DIAGNOSIS — M7989 Other specified soft tissue disorders: Secondary | ICD-10-CM | POA: Diagnosis not present

## 2017-02-10 DIAGNOSIS — Z1329 Encounter for screening for other suspected endocrine disorder: Secondary | ICD-10-CM | POA: Diagnosis not present

## 2017-02-21 ENCOUNTER — Ambulatory Visit
Admission: RE | Admit: 2017-02-21 | Discharge: 2017-02-21 | Disposition: A | Discharge status: Home / Self Care | Payer: 59 | Source: Ambulatory Visit | Attending: Internal Medicine | Admitting: Internal Medicine

## 2017-02-21 DIAGNOSIS — Z1231 Encounter for screening mammogram for malignant neoplasm of breast: Secondary | ICD-10-CM | POA: Insufficient documentation

## 2017-02-25 DIAGNOSIS — E041 Nontoxic single thyroid nodule: Secondary | ICD-10-CM | POA: Diagnosis not present

## 2017-03-01 DIAGNOSIS — R809 Proteinuria, unspecified: Secondary | ICD-10-CM | POA: Diagnosis not present

## 2017-03-01 DIAGNOSIS — I129 Hypertensive chronic kidney disease with stage 1 through stage 4 chronic kidney disease, or unspecified chronic kidney disease: Secondary | ICD-10-CM | POA: Diagnosis not present

## 2017-03-01 DIAGNOSIS — N184 Chronic kidney disease, stage 4 (severe): Secondary | ICD-10-CM | POA: Diagnosis not present

## 2017-03-14 ENCOUNTER — Encounter: Payer: 59 | Attending: Cardiology | Admitting: *Deleted

## 2017-03-14 VITALS — Ht 68.5 in | Wt 257.2 lb

## 2017-03-14 DIAGNOSIS — Z48812 Encounter for surgical aftercare following surgery on the circulatory system: Secondary | ICD-10-CM | POA: Diagnosis present

## 2017-03-14 DIAGNOSIS — Z955 Presence of coronary angioplasty implant and graft: Secondary | ICD-10-CM | POA: Diagnosis not present

## 2017-03-14 NOTE — Progress Notes (Signed)
Cardiac Individual Treatment Plan  Patient Details  Name: Sharon Arias MRN: [id number] Date of Birth: 08/05/1961 Referring Provider:     Cardiac Rehab from 03/14/2017 in [facility] Cardiac and Pulmonary Rehab  Referring Provider  [name] MD      Initial Encounter Date:    Cardiac Rehab from 03/14/2017 in [facility] Cardiac and Pulmonary Rehab  Date  03/14/17  Referring Provider  [name] MD      Visit Diagnosis: Status post coronary artery stent placement  Patient's Home Medications on Admission:  Current Outpatient Prescriptions:  .  allopurinol (ZYLOPRIM) 100 MG tablet, Take by mouth., Disp: , Rfl:  .  amLODipine (NORVASC) 10 MG tablet, , Disp: , Rfl:  .  aspirin EC 81 MG tablet, Take by mouth., Disp: , Rfl:  .  atorvastatin (LIPITOR) 40 MG tablet, Take by mouth., Disp: , Rfl:  .  furosemide (LASIX) 40 MG tablet, Take 1 tablet by mouth  daily, Disp: , Rfl:  .  Insulin Glargine (LANTUS SOLOSTAR) 100 UNIT/ML Solostar Pen, Inject subcutaneously 74  units at bedtime ( Split  into 2 shots of 37 units  each at 2 different sites ), Disp: , Rfl:  .  insulin lispro (HUMALOG KWIKPEN) 100 UNIT/ML KiwkPen, Inject subcutaneously 22 units with breakfast,24 units with lunch,and 32 units with dinner, plus sliding scale as directed., Disp: , Rfl:  .  losartan (COZAAR) 100 MG tablet, , Disp: , Rfl:  .  metoprolol (LOPRESSOR) 50 MG tablet, Take 1 tablet by mouth two  times daily, Disp: , Rfl:  .  ticagrelor (BRILINTA) 90 MG TABS tablet, Take by mouth., Disp: , Rfl:   Past Medical History: Past Medical History:  Diagnosis Date  . Anemia in chronic kidney disease 05/19/2016  . CAD (coronary artery disease)   . Chicken pox   . Chronic kidney disease    per dr sparks  . CVA (cerebral vascular accident) ([place])   . Detached retina   . Diabetes mellitus without complication ([place])    type 2  . Diabetic neuropathy ([place])   . Diabetic retinopathy ([place])    legally blind  . Hyperlipidemia    . Hypertension   . Myocardial infarction   . Obesity   . PONV (postoperative nausea and vomiting)   . Sciatica of right side    going to see physiciatry    Tobacco Use: History  Smoking Status  . Never Smoker  Smokeless Tobacco  . Never Used    Labs: Recent Review Flowsheet Data    There is no flowsheet data to display.       Exercise Target Goals: Date: 03/14/17  Exercise Program Goal: Individual exercise prescription set with THRR, safety & activity barriers. Participant demonstrates ability to understand and report RPE using BORG scale, to self-measure pulse accurately, and to acknowledge the importance of the exercise prescription.  Exercise Prescription Goal: Starting with aerobic activity 30 plus minutes a day, 3 days per week for initial exercise prescription. Provide home exercise prescription and guidelines that participant acknowledges understanding prior to discharge.  Activity Barriers & Risk Stratification:     Activity Barriers & Cardiac Risk Stratification - 03/14/17 1405      Activity Barriers & Cardiac Risk Stratification   Activity Barriers Back Problems;Other (comment);Balance Concerns;Muscular Weakness;Deconditioning;Shortness of Breath   Comments GOUT, Vision Loss, painful to walk   Cardiac Risk Stratification High      6 Minute Walk:     6 Minute Walk  [place] Name 03/14/17 1514         6 Minute Walk   Phase Initial     Distance 975 feet     Walk Time 4.77 minutes     # of Rest Breaks 1  rested 1:14      MPH 2.32     METS 2.35     RPE 15     Perceived Dyspnea  3     VO2 Peak 8.21     Symptoms Yes (comment)     Comments dizzy and lightheaded, SOB     Resting HR 62 bpm     Resting BP 128/64     Max Ex. HR 93 bpm     Max Ex. BP 126/54     2 Minute Post BP 116/60        Oxygen Initial Assessment:   Oxygen Re-Evaluation:   Oxygen Discharge (Final Oxygen Re-Evaluation):   Initial Exercise Prescription:     Initial  Exercise Prescription - 03/14/17 1500      Date of Initial Exercise RX and Referring Provider   Date 03/14/17   Referring Provider [name] MD     Treadmill   MPH 1.8   Grade 0   Minutes 15   METs 2.32     Recumbant Bike   Level 1   RPM 50   Minutes 15   METs 2     NuStep   Level 1   SPM 80   Minutes 15   METs 2     Prescription Details   Frequency (times per week) 3   Duration Progress to 45 minutes of aerobic exercise without signs/symptoms of physical distress     Intensity   THRR 40-80% of Max Heartrate 103-144   Ratings of Perceived Exertion 11-13   Perceived Dyspnea 0-4     Progression   Progression Continue to progress workloads to maintain intensity without signs/symptoms of physical distress.     Resistance Training   Training Prescription Yes   Weight 2 lbs   Reps 10-15      Perform Capillary Blood Glucose checks as needed.  Exercise Prescription Changes:     Exercise Prescription Changes    Row Name 03/14/17 1500             Response to Exercise   Blood Pressure (Admit) 128/64       Blood Pressure (Exercise) 126/54       Blood Pressure (Exit) 116/60       Heart Rate (Admit) 62 bpm       Heart Rate (Exercise) 93 bpm       Heart Rate (Exit) 67 bpm       Oxygen Saturation (Admit) 100 %       Oxygen Saturation (Exercise) 99 %       Rating of Perceived Exertion (Exercise) 15       Perceived Dyspnea (Exercise) 3       Symptoms dizzy, lightheaded, SON       Comments walk test results          Exercise Comments:   Exercise Goals and Review:     Exercise Goals    Row Name 03/14/17 1518             Exercise Goals   Increase Physical Activity Yes  get into exercise routine       Intervention Provide advice, education, support and counseling about physical activity/exercise needs.;Develop an individualized exercise prescription for  aerobic and resistive training based on initial evaluation findings, risk stratification,  comorbidities and participant's personal goals.       Expected Outcomes Achievement of increased cardiorespiratory fitness and enhanced flexibility, muscular endurance and strength shown through measurements of functional capacity and personal statement of participant.       Increase Strength and Stamina Yes  increase energy       Intervention Provide advice, education, support and counseling about physical activity/exercise needs.;Develop an individualized exercise prescription for aerobic and resistive training based on initial evaluation findings, risk stratification, comorbidities and participant's personal goals.       Expected Outcomes Achievement of increased cardiorespiratory fitness and enhanced flexibility, muscular endurance and strength shown through measurements of functional capacity and personal statement of participant.          Exercise Goals Re-Evaluation :   Discharge Exercise Prescription (Final Exercise Prescription Changes):     Exercise Prescription Changes - 03/14/17 1500      Response to Exercise   Blood Pressure (Admit) 128/64   Blood Pressure (Exercise) 126/54   Blood Pressure (Exit) 116/60   Heart Rate (Admit) 62 bpm   Heart Rate (Exercise) 93 bpm   Heart Rate (Exit) 67 bpm   Oxygen Saturation (Admit) 100 %   Oxygen Saturation (Exercise) 99 %   Rating of Perceived Exertion (Exercise) 15   Perceived Dyspnea (Exercise) 3   Symptoms dizzy, lightheaded, SON   Comments walk test results      Nutrition:  Target Goals: Understanding of nutrition guidelines, daily intake of sodium 1500mg , cholesterol 200mg , calories 30% from fat and 7% or less from saturated fats, daily to have 5 or more servings of fruits and vegetables.  Biometrics:     Pre Biometrics - 03/14/17 1518      Pre Biometrics   Height 5' 8.5" (1.74 m)   Weight 257 lb 3.2 oz (116.7 kg)   Waist Circumference 48 inches   Hip Circumference 50 inches   Waist to Hip Ratio 0.96 %   BMI  (Calculated) 38.6   Single Leg Stand 3.43 seconds       Nutrition Therapy Plan and Nutrition Goals:     Nutrition Therapy & Goals - 03/14/17 1351      Intervention Plan   Intervention Prescribe, educate and counsel regarding individualized specific dietary modifications aiming towards targeted core components such as weight, hypertension, lipid management, diabetes, heart failure and other comorbidities.   Expected Outcomes Short Term Goal: Understand basic principles of dietary content, such as calories, fat, sodium, cholesterol and nutrients.;Short Term Goal: A plan has been developed with personal nutrition goals set during dietitian appointment.;Long Term Goal: Adherence to prescribed nutrition plan.      Nutrition Discharge: Rate Your Plate Scores:     Nutrition Assessments - 03/14/17 1401      MEDFICTS Scores   Pre Score 9      Nutrition Goals Re-Evaluation:   Nutrition Goals Discharge (Final Nutrition Goals Re-Evaluation):   Psychosocial: Target Goals: Acknowledge presence or absence of significant depression and/or stress, maximize coping skills, provide positive support system. Participant is able to verbalize types and ability to use techniques and skills needed for reducing stress and depression.   Initial Review & Psychosocial Screening:     Initial Psych Review & Screening - 03/14/17 1355      Initial Review   Current issues with Current Sleep Concerns  New not getting to sleep and able to stay asleep     Family  Dynamics   Good Support System? Yes  Husband and adult children     Barriers   Psychosocial barriers to participate in program There are no identifiable barriers or psychosocial needs.;The patient should benefit from training in stress management and relaxation.     Screening Interventions   Interventions Encouraged to exercise      Quality of Life Scores:      Quality of Life - 03/14/17 1356      Quality of Life Scores    Health/Function Pre 17.43 %   Socioeconomic Pre 23.71 %   Psych/Spiritual Pre 26 %   Family Pre 25.2 %   GLOBAL Pre 21.63 %      PHQ-9: Recent Review Flowsheet Data    Depression screen [facility] 2/9 03/14/2017   Decreased Interest 0   Down, Depressed, Hopeless 0   PHQ - 2 Score 0   Altered sleeping 2    Tired, decreased energy 3    Change in appetite 0   Feeling bad or failure about yourself  0   Trouble concentrating 0   Moving slowly or fidgety/restless 0   Suicidal thoughts 0   PHQ-9 Score 5   Difficult doing work/chores Somewhat difficult     Interpretation of Total Score  Total Score Depression Severity:  1-4 = Minimal depression, 5-9 = Mild depression, 10-14 = Moderate depression, 15-19 = Moderately severe depression, 20-27 = Severe depression   Psychosocial Evaluation and Intervention:   Psychosocial Re-Evaluation:   Psychosocial Discharge (Final Psychosocial Re-Evaluation):   Vocational Rehabilitation: Provide vocational rehab assistance to qualifying candidates.   Vocational Rehab Evaluation & Intervention:     Vocational Rehab - 03/14/17 1401      Initial Vocational Rehab Evaluation & Intervention   Assessment shows need for Vocational Rehabilitation No      Education: Education Goals: Education classes will be provided on a weekly basis, covering required topics. Participant will state understanding/return demonstration of topics presented.  Learning Barriers/Preferences:     Learning Barriers/Preferences - 03/14/17 1359      Learning Barriers/Preferences   Learning Barriers Sight  unable to see secondary to eye disease   Learning Preferences Audio;Verbal Instruction      Education Topics: General Nutrition Guidelines/Fats and Fiber: -Group instruction provided by verbal, written material, models and posters to present the general guidelines for heart healthy nutrition. Gives an explanation and review of dietary fats and fiber.   Controlling  Sodium/Reading Food Labels: -Group verbal and written material supporting the discussion of sodium use in heart healthy nutrition. Review and explanation with models, verbal and written materials for utilization of the food label.   Exercise Physiology & Risk Factors: - Group verbal and written instruction with models to review the exercise physiology of the cardiovascular system and associated critical values. Details cardiovascular disease risk factors and the goals associated with each risk factor.   Aerobic Exercise & Resistance Training: - Gives group verbal and written discussion on the health impact of inactivity. On the components of aerobic and resistive training programs and the benefits of this training and how to safely progress through these programs.   Flexibility, Balance, General Exercise Guidelines: - Provides group verbal and written instruction on the benefits of flexibility and balance training programs. Provides general exercise guidelines with specific guidelines to those with heart or lung disease. Demonstration and skill practice provided.   Stress Management: - Provides group verbal and written instruction about the health risks of elevated stress, cause of high stress,  and healthy ways to reduce stress.   Depression: - Provides group verbal and written instruction on the correlation between heart/lung disease and depressed mood, treatment options, and the stigmas associated with seeking treatment.   Anatomy & Physiology of the Heart: - Group verbal and written instruction and models provide basic cardiac anatomy and physiology, with the coronary electrical and arterial systems. Review of: AMI, Angina, Valve disease, Heart Failure, Cardiac Arrhythmia, Pacemakers, and the ICD.   Cardiac Procedures: - Group verbal and written instruction and models to describe the testing methods done to diagnose heart disease. Reviews the outcomes of the test results. Describes  the treatment choices: Medical Management, Angioplasty, or Coronary Bypass Surgery.   Cardiac Medications: - Group verbal and written instruction to review commonly prescribed medications for heart disease. Reviews the medication, class of the drug, and side effects. Includes the steps to properly store meds and maintain the prescription regimen.   Go Sex-Intimacy & Heart Disease, Get SMART - Goal Setting: - Group verbal and written instruction through game format to discuss heart disease and the return to sexual intimacy. Provides group verbal and written material to discuss and apply goal setting through the application of the S.M.A.R.T. Method.   Other Matters of the Heart: - Provides group verbal, written materials and models to describe Heart Failure, Angina, Valve Disease, and Diabetes in the realm of heart disease. Includes description of the disease process and treatment options available to the cardiac patient.   Exercise & Equipment Safety: - Individual verbal instruction and demonstration of equipment use and safety with use of the equipment.   Cardiac Rehab from 03/14/2017 in [facility] Cardiac and Pulmonary Rehab  Date  03/14/17  Educator  Sb  Instruction Review Code  2- meets goals/outcomes      Infection Prevention: - Provides verbal and written material to individual with discussion of infection control including proper hand washing and proper equipment cleaning during exercise session.   Cardiac Rehab from 03/14/2017 in [facility] Cardiac and Pulmonary Rehab  Date  03/14/17  Educator  SB  Instruction Review Code  2- meets goals/outcomes      Falls Prevention: - Provides verbal and written material to individual with discussion of falls prevention and safety.   Cardiac Rehab from 03/14/2017 in [facility] Cardiac and Pulmonary Rehab  Date  03/14/17  Educator  SB  Instruction Review Code  2- meets goals/outcomes      Diabetes: - Individual verbal and written instruction to review  signs/symptoms of diabetes, desired ranges of glucose level fasting, after meals and with exercise. Advice that pre and post exercise glucose checks will be done for 3 sessions at entry of program.   Cardiac Rehab from 03/14/2017 in [facility] Cardiac and Pulmonary Rehab  Date  03/14/17  Educator  SB  Instruction Review Code  2- meets goals/outcomes       Knowledge Questionnaire Score:     Knowledge Questionnaire Score - 03/14/17 1400      Knowledge Questionnaire Score   Pre Score 26/28  Reviewed correct responses with return vebalization of understanding.  Explained all topics will be part of the education classes      Core Components/Risk Factors/Patient Goals at Admission:     Personal Goals and Risk Factors at Admission - 03/14/17 1404      Core Components/Risk Factors/Patient Goals on Admission    Weight Management Yes;Weight Loss;Obesity   Intervention Weight Management: Develop a combined nutrition and exercise program designed to reach desired caloric intake, while  maintaining appropriate intake of nutrient and fiber, sodium and fats, and appropriate energy expenditure required for the weight goal.;Weight Management: Provide education and appropriate resources to help participant work on and attain dietary goals.;Weight Management/Obesity: Establish reasonable short term and long term weight goals.   Admit Weight 257 lb 3.2 oz (116.7 kg)   Goal Weight: Short Term 250 lb (113.4 kg)   Goal Weight: Long Term 190 lb (86.2 kg)   Expected Outcomes Short Term: Continue to assess and modify interventions until short term weight is achieved;Long Term: Adherence to nutrition and physical activity/exercise program aimed toward attainment of established weight goal;Weight Loss: Understanding of general recommendations for a balanced deficit meal plan, which promotes 1-2 lb weight loss per week and includes a negative energy balance of [id number] kcal/d;Understanding recommendations for meals to  include 15-35% energy as protein, 25-35% energy from fat, 35-60% energy from carbohydrates, less than 200mg  of dietary cholesterol, 20-35 gm of total fiber daily;Understanding of distribution of calorie intake throughout the day with the consumption of 4-5 meals/snacks   Diabetes Yes  HgbA1C around 7% presently   Intervention Provide education about signs/symptoms and action to take for hypo/hyperglycemia.;Provide education about proper nutrition, including hydration, and aerobic/resistive exercise prescription along with prescribed medications to achieve blood glucose in normal ranges: Fasting glucose 65-99 mg/dL   Expected Outcomes Short Term: Participant verbalizes understanding of the signs/symptoms and immediate care of hyper/hypoglycemia, proper foot care and importance of medication, aerobic/resistive exercise and nutrition plan for blood glucose control.;Long Term: Attainment of HbA1C < 7%.   Hypertension Yes   Intervention Provide education on lifestyle modifcations including regular physical activity/exercise, weight management, moderate sodium restriction and increased consumption of fresh fruit, vegetables, and low fat dairy, alcohol moderation, and smoking cessation.;Monitor prescription use compliance.   Expected Outcomes Short Term: Continued assessment and intervention until BP is < 140/20mm HG in hypertensive participants. < 130/27mm HG in hypertensive participants with diabetes, heart failure or chronic kidney disease.;Long Term: Maintenance of blood pressure at goal levels.   Lipids Yes   Intervention Provide education and support for participant on nutrition & aerobic/resistive exercise along with prescribed medications to achieve LDL 70mg , HDL >40mg .   Expected Outcomes Short Term: Participant states understanding of desired cholesterol values and is compliant with medications prescribed. Participant is following exercise prescription and nutrition guidelines.;Long Term: Cholesterol  controlled with medications as prescribed, with individualized exercise RX and with personalized nutrition plan. Value goals: LDL < 70mg , HDL > 40 mg.      Core Components/Risk Factors/Patient Goals Review:    Core Components/Risk Factors/Patient Goals at Discharge (Final Review):    ITP Comments:     ITP Comments    Row Name 03/14/17 1344           ITP Comments Medical review completed today. Initial ITP created. Documentation of diagnosis can be found Care Everywhere Admission  [id number] and   Office Visit 01/27/2017          Comments: Initial ITP

## 2017-03-14 NOTE — Patient Instructions (Signed)
Patient Instructions  Patient Details  Name: Sharon Arias MRN: [id number] Date of Birth: 1961/04/12 Referring Provider:  Isaias Cowman, MD  Below are the personal goals you chose as well as exercise and nutrition goals. Our goal is to help you keep on track towards obtaining and maintaining your goals. We will be discussing your progress on these goals with you throughout the program.  Initial Exercise Prescription:     Initial Exercise Prescription - 03/14/17 1500      Date of Initial Exercise RX and Referring Provider   Date 03/14/17   Referring Provider [name] MD     Treadmill   MPH 1.8   Grade 0   Minutes 15   METs 2.32     Recumbant Bike   Level 1   RPM 50   Minutes 15   METs 2     NuStep   Level 1   SPM 80   Minutes 15   METs 2     Prescription Details   Frequency (times per week) 3   Duration Progress to 45 minutes of aerobic exercise without signs/symptoms of physical distress     Intensity   THRR 40-80% of Max Heartrate 103-144   Ratings of Perceived Exertion 11-13   Perceived Dyspnea 0-4     Progression   Progression Continue to progress workloads to maintain intensity without signs/symptoms of physical distress.     Resistance Training   Training Prescription Yes   Weight 2 lbs   Reps 10-15      Exercise Goals: Frequency: Be able to perform aerobic exercise three times per week working toward 3-5 days per week.  Intensity: Work with a perceived exertion of 11 (fairly light) - 15 (hard) as tolerated. Follow your new exercise prescription and watch for changes in prescription as you progress with the program. Changes will be reviewed with you when they are made.  Duration: You should be able to do 30 minutes of continuous aerobic exercise in addition to a 5 minute warm-up and a 5 minute cool-down routine.  Nutrition Goals: Your personal nutrition goals will be established when you do your nutrition analysis with the  dietician.  The following are nutrition guidelines to follow: Cholesterol < 200mg /day Sodium < 1500mg /day Fiber: Women over 50 yrs - 21 grams per day  Personal Goals:     Personal Goals and Risk Factors at Admission - 03/14/17 1404      Core Components/Risk Factors/Patient Goals on Admission    Weight Management Yes;Weight Loss;Obesity   Intervention Weight Management: Develop a combined nutrition and exercise program designed to reach desired caloric intake, while maintaining appropriate intake of nutrient and fiber, sodium and fats, and appropriate energy expenditure required for the weight goal.;Weight Management: Provide education and appropriate resources to help participant work on and attain dietary goals.;Weight Management/Obesity: Establish reasonable short term and long term weight goals.   Admit Weight 257 lb 3.2 oz (116.7 kg)   Goal Weight: Short Term 250 lb (113.4 kg)   Goal Weight: Long Term 190 lb (86.2 kg)   Expected Outcomes Short Term: Continue to assess and modify interventions until short term weight is achieved;Long Term: Adherence to nutrition and physical activity/exercise program aimed toward attainment of established weight goal;Weight Loss: Understanding of general recommendations for a balanced deficit meal plan, which promotes 1-2 lb weight loss per week and includes a negative energy balance of [phone] kcal/d;Understanding recommendations for meals to include 15-35% energy as protein, 25-35% energy from  fat, 35-60% energy from carbohydrates, less than 200mg  of dietary cholesterol, 20-35 gm of total fiber daily;Understanding of distribution of calorie intake throughout the day with the consumption of 4-5 meals/snacks   Diabetes Yes  HgbA1C around 7% presently   Intervention Provide education about signs/symptoms and action to take for hypo/hyperglycemia.;Provide education about proper nutrition, including hydration, and aerobic/resistive exercise prescription along  with prescribed medications to achieve blood glucose in normal ranges: Fasting glucose 65-99 mg/dL   Expected Outcomes Short Term: Participant verbalizes understanding of the signs/symptoms and immediate care of hyper/hypoglycemia, proper foot care and importance of medication, aerobic/resistive exercise and nutrition plan for blood glucose control.;Long Term: Attainment of HbA1C < 7%.   Hypertension Yes   Intervention Provide education on lifestyle modifcations including regular physical activity/exercise, weight management, moderate sodium restriction and increased consumption of fresh fruit, vegetables, and low fat dairy, alcohol moderation, and smoking cessation.;Monitor prescription use compliance.   Expected Outcomes Short Term: Continued assessment and intervention until BP is < 140/47mm HG in hypertensive participants. < 130/33mm HG in hypertensive participants with diabetes, heart failure or chronic kidney disease.;Long Term: Maintenance of blood pressure at goal levels.   Lipids Yes   Intervention Provide education and support for participant on nutrition & aerobic/resistive exercise along with prescribed medications to achieve LDL 70mg , HDL >40mg .   Expected Outcomes Short Term: Participant states understanding of desired cholesterol values and is compliant with medications prescribed. Participant is following exercise prescription and nutrition guidelines.;Long Term: Cholesterol controlled with medications as prescribed, with individualized exercise RX and with personalized nutrition plan. Value goals: LDL < 70mg , HDL > 40 mg.      Tobacco Use Initial Evaluation: History  Smoking Status  . Never Smoker  Smokeless Tobacco  . Never Used    Copy of goals given to participant.

## 2017-03-16 ENCOUNTER — Encounter: Payer: 59 | Admitting: *Deleted

## 2017-03-16 ENCOUNTER — Encounter: Payer: Self-pay | Admitting: *Deleted

## 2017-03-16 DIAGNOSIS — Z955 Presence of coronary angioplasty implant and graft: Secondary | ICD-10-CM

## 2017-03-16 DIAGNOSIS — Z48812 Encounter for surgical aftercare following surgery on the circulatory system: Secondary | ICD-10-CM | POA: Diagnosis not present

## 2017-03-16 LAB — GLUCOSE, CAPILLARY
Glucose-Capillary: 118 mg/dL — ABNORMAL HIGH (ref 65–99)
Glucose-Capillary: 126 mg/dL — ABNORMAL HIGH (ref 65–99)

## 2017-03-16 NOTE — Progress Notes (Signed)
Daily Session Note  Patient Details  Name: REIANNA BATDORF MRN: [id number] Date of Birth: 1961-08-06 Referring Provider:     Cardiac Rehab from 03/14/2017 in [facility]  Referring Provider  [name] MD      Encounter Date: 03/16/2017  Check In:     Session Check In - 03/16/17 0913      Check-In   Location [facility]-Cardiac & Pulmonary Rehab   Staff Present Alberteen Sam, MA, ACSM RCEP, Exercise Physiologist;Susanne Bice, RN, BSN, Lance Sell, BA, ACSM CEP, Exercise Physiologist   Supervising physician immediately available to respond to emergencies See telemetry face sheet for immediately available ER MD   Medication changes reported     No   Warm-up and Cool-down Performed on first and last piece of equipment   Resistance Training Performed Yes   VAD Patient? No     Pain Assessment   Currently in Pain? No/denies   Multiple Pain Sites No         History  Smoking Status  . Never Smoker  Smokeless Tobacco  . Never Used    Goals Met:  Exercise tolerated well Personal goals reviewed No report of cardiac concerns or symptoms Strength training completed today  Goals Unmet:  Not Applicable  Comments: First full day of exercise!  Patient was oriented to gym and equipment including functions, settings, policies, and procedures.  Patient's individual exercise prescription and treatment plan were reviewed.  All starting workloads were established based on the results of the 6 minute walk test done at initial orientation visit.  The plan for exercise progression was also introduced and progression will be customized based on patient's performance and goals.    Dr. Emily Filbert is Medical Director for [place] and [facility].

## 2017-03-16 NOTE — Progress Notes (Signed)
Cardiac Individual Treatment Plan  Patient Details  Name: Sharon Arias MRN: [id number] Date of Birth: Nov 04, 1961 Referring Provider:     Cardiac Rehab from 03/14/2017 in [facility] Cardiac and Pulmonary Rehab  Referring Provider  [name] MD      Initial Encounter Date:    Cardiac Rehab from 03/14/2017 in [facility] Cardiac and Pulmonary Rehab  Date  03/14/17  Referring Provider  [name] MD      Visit Diagnosis: Status post coronary artery stent placement  Patient's Home Medications on Admission:  Current Outpatient Prescriptions:  .  allopurinol (ZYLOPRIM) 100 MG tablet, Take by mouth., Disp: , Rfl:  .  amLODipine (NORVASC) 10 MG tablet, , Disp: , Rfl:  .  aspirin EC 81 MG tablet, Take by mouth., Disp: , Rfl:  .  atorvastatin (LIPITOR) 40 MG tablet, Take by mouth., Disp: , Rfl:  .  furosemide (LASIX) 40 MG tablet, Take 1 tablet by mouth  daily, Disp: , Rfl:  .  Insulin Glargine (LANTUS SOLOSTAR) 100 UNIT/ML Solostar Pen, Inject subcutaneously 74  units at bedtime ( Split  into 2 shots of 37 units  each at 2 different sites ), Disp: , Rfl:  .  insulin lispro (HUMALOG KWIKPEN) 100 UNIT/ML KiwkPen, Inject subcutaneously 22 units with breakfast,24 units with lunch,and 32 units with dinner, plus sliding scale as directed., Disp: , Rfl:  .  losartan (COZAAR) 100 MG tablet, , Disp: , Rfl:  .  metoprolol (LOPRESSOR) 50 MG tablet, Take 1 tablet by mouth two  times daily, Disp: , Rfl:  .  ticagrelor (BRILINTA) 90 MG TABS tablet, Take by mouth., Disp: , Rfl:   Past Medical History: Past Medical History:  Diagnosis Date  . Anemia in chronic kidney disease 05/19/2016  . CAD (coronary artery disease)   . Chicken pox   . Chronic kidney disease    per dr sparks  . CVA (cerebral vascular accident) ([place])   . Detached retina   . Diabetes mellitus without complication ([place])    type 2  . Diabetic neuropathy ([place])   . Diabetic retinopathy ([place])    legally blind  . Hyperlipidemia    . Hypertension   . Myocardial infarction   . Obesity   . PONV (postoperative nausea and vomiting)   . Sciatica of right side    going to see physiciatry    Tobacco Use: History  Smoking Status  . Never Smoker  Smokeless Tobacco  . Never Used    Labs: Recent Review Flowsheet Data    There is no flowsheet data to display.       Exercise Target Goals:    Exercise Program Goal: Individual exercise prescription set with THRR, safety & activity barriers. Participant demonstrates ability to understand and report RPE using BORG scale, to self-measure pulse accurately, and to acknowledge the importance of the exercise prescription.  Exercise Prescription Goal: Starting with aerobic activity 30 plus minutes a day, 3 days per week for initial exercise prescription. Provide home exercise prescription and guidelines that participant acknowledges understanding prior to discharge.  Activity Barriers & Risk Stratification:     Activity Barriers & Cardiac Risk Stratification - 03/14/17 1405      Activity Barriers & Cardiac Risk Stratification   Activity Barriers Back Problems;Other (comment);Balance Concerns;Muscular Weakness;Deconditioning;Shortness of Breath   Comments GOUT, Vision Loss, painful to walk   Cardiac Risk Stratification High      6 Minute Walk:     6 Minute Walk  [place] Name 03/14/17 1514         6 Minute Walk   Phase Initial     Distance 975 feet     Walk Time 4.77 minutes     # of Rest Breaks 1  rested 1:14      MPH 2.32     METS 2.35     RPE 15     Perceived Dyspnea  3     VO2 Peak 8.21     Symptoms Yes (comment)     Comments dizzy and lightheaded, SOB     Resting HR 62 bpm     Resting BP 128/64     Max Ex. HR 93 bpm     Max Ex. BP 126/54     2 Minute Post BP 116/60        Oxygen Initial Assessment:   Oxygen Re-Evaluation:   Oxygen Discharge (Final Oxygen Re-Evaluation):   Initial Exercise Prescription:     Initial Exercise  Prescription - 03/14/17 1500      Date of Initial Exercise RX and Referring Provider   Date 03/14/17   Referring Provider [name] MD     Treadmill   MPH 1.8   Grade 0   Minutes 15   METs 2.32     Recumbant Bike   Level 1   RPM 50   Minutes 15   METs 2     NuStep   Level 1   SPM 80   Minutes 15   METs 2     Prescription Details   Frequency (times per week) 3   Duration Progress to 45 minutes of aerobic exercise without signs/symptoms of physical distress     Intensity   THRR 40-80% of Max Heartrate 103-144   Ratings of Perceived Exertion 11-13   Perceived Dyspnea 0-4     Progression   Progression Continue to progress workloads to maintain intensity without signs/symptoms of physical distress.     Resistance Training   Training Prescription Yes   Weight 2 lbs   Reps 10-15      Perform Capillary Blood Glucose checks as needed.  Exercise Prescription Changes:     Exercise Prescription Changes    Row Name 03/14/17 1500             Response to Exercise   Blood Pressure (Admit) 128/64       Blood Pressure (Exercise) 126/54       Blood Pressure (Exit) 116/60       Heart Rate (Admit) 62 bpm       Heart Rate (Exercise) 93 bpm       Heart Rate (Exit) 67 bpm       Oxygen Saturation (Admit) 100 %       Oxygen Saturation (Exercise) 99 %       Rating of Perceived Exertion (Exercise) 15       Perceived Dyspnea (Exercise) 3       Symptoms dizzy, lightheaded, SON       Comments walk test results          Exercise Comments:   Exercise Goals and Review:     Exercise Goals    Row Name 03/14/17 1518             Exercise Goals   Increase Physical Activity Yes  get into exercise routine       Intervention Provide advice, education, support and counseling about physical activity/exercise needs.;Develop an individualized exercise prescription for  aerobic and resistive training based on initial evaluation findings, risk stratification,  comorbidities and participant's personal goals.       Expected Outcomes Achievement of increased cardiorespiratory fitness and enhanced flexibility, muscular endurance and strength shown through measurements of functional capacity and personal statement of participant.       Increase Strength and Stamina Yes  increase energy       Intervention Provide advice, education, support and counseling about physical activity/exercise needs.;Develop an individualized exercise prescription for aerobic and resistive training based on initial evaluation findings, risk stratification, comorbidities and participant's personal goals.       Expected Outcomes Achievement of increased cardiorespiratory fitness and enhanced flexibility, muscular endurance and strength shown through measurements of functional capacity and personal statement of participant.          Exercise Goals Re-Evaluation :   Discharge Exercise Prescription (Final Exercise Prescription Changes):     Exercise Prescription Changes - 03/14/17 1500      Response to Exercise   Blood Pressure (Admit) 128/64   Blood Pressure (Exercise) 126/54   Blood Pressure (Exit) 116/60   Heart Rate (Admit) 62 bpm   Heart Rate (Exercise) 93 bpm   Heart Rate (Exit) 67 bpm   Oxygen Saturation (Admit) 100 %   Oxygen Saturation (Exercise) 99 %   Rating of Perceived Exertion (Exercise) 15   Perceived Dyspnea (Exercise) 3   Symptoms dizzy, lightheaded, SON   Comments walk test results      Nutrition:  Target Goals: Understanding of nutrition guidelines, daily intake of sodium 1500mg , cholesterol 200mg , calories 30% from fat and 7% or less from saturated fats, daily to have 5 or more servings of fruits and vegetables.  Biometrics:     Pre Biometrics - 03/14/17 1518      Pre Biometrics   Height 5' 8.5" (1.74 m)   Weight 257 lb 3.2 oz (116.7 kg)   Waist Circumference 48 inches   Hip Circumference 50 inches   Waist to Hip Ratio 0.96 %   BMI  (Calculated) 38.6   Single Leg Stand 3.43 seconds       Nutrition Therapy Plan and Nutrition Goals:     Nutrition Therapy & Goals - 03/14/17 1351      Intervention Plan   Intervention Prescribe, educate and counsel regarding individualized specific dietary modifications aiming towards targeted core components such as weight, hypertension, lipid management, diabetes, heart failure and other comorbidities.   Expected Outcomes Short Term Goal: Understand basic principles of dietary content, such as calories, fat, sodium, cholesterol and nutrients.;Short Term Goal: A plan has been developed with personal nutrition goals set during dietitian appointment.;Long Term Goal: Adherence to prescribed nutrition plan.      Nutrition Discharge: Rate Your Plate Scores:     Nutrition Assessments - 03/14/17 1401      MEDFICTS Scores   Pre Score 9      Nutrition Goals Re-Evaluation:   Nutrition Goals Discharge (Final Nutrition Goals Re-Evaluation):   Psychosocial: Target Goals: Acknowledge presence or absence of significant depression and/or stress, maximize coping skills, provide positive support system. Participant is able to verbalize types and ability to use techniques and skills needed for reducing stress and depression.   Initial Review & Psychosocial Screening:     Initial Psych Review & Screening - 03/14/17 1355      Initial Review   Current issues with Current Sleep Concerns  New not getting to sleep and able to stay asleep     Family  Dynamics   Good Support System? Yes  Husband and adult children     Barriers   Psychosocial barriers to participate in program There are no identifiable barriers or psychosocial needs.;The patient should benefit from training in stress management and relaxation.     Screening Interventions   Interventions Encouraged to exercise      Quality of Life Scores:      Quality of Life - 03/14/17 1356      Quality of Life Scores    Health/Function Pre 17.43 %   Socioeconomic Pre 23.71 %   Psych/Spiritual Pre 26 %   Family Pre 25.2 %   GLOBAL Pre 21.63 %      PHQ-9: Recent Review Flowsheet Data    Depression screen [facility] 2/9 03/14/2017   Decreased Interest 0   Down, Depressed, Hopeless 0   PHQ - 2 Score 0   Altered sleeping 2    Tired, decreased energy 3    Change in appetite 0   Feeling bad or failure about yourself  0   Trouble concentrating 0   Moving slowly or fidgety/restless 0   Suicidal thoughts 0   PHQ-9 Score 5   Difficult doing work/chores Somewhat difficult     Interpretation of Total Score  Total Score Depression Severity:  1-4 = Minimal depression, 5-9 = Mild depression, 10-14 = Moderate depression, 15-19 = Moderately severe depression, 20-27 = Severe depression   Psychosocial Evaluation and Intervention:   Psychosocial Re-Evaluation:   Psychosocial Discharge (Final Psychosocial Re-Evaluation):   Vocational Rehabilitation: Provide vocational rehab assistance to qualifying candidates.   Vocational Rehab Evaluation & Intervention:     Vocational Rehab - 03/14/17 1401      Initial Vocational Rehab Evaluation & Intervention   Assessment shows need for Vocational Rehabilitation No      Education: Education Goals: Education classes will be provided on a weekly basis, covering required topics. Participant will state understanding/return demonstration of topics presented.  Learning Barriers/Preferences:     Learning Barriers/Preferences - 03/14/17 1359      Learning Barriers/Preferences   Learning Barriers Sight  unable to see secondary to eye disease   Learning Preferences Audio;Verbal Instruction      Education Topics: General Nutrition Guidelines/Fats and Fiber: -Group instruction provided by verbal, written material, models and posters to present the general guidelines for heart healthy nutrition. Gives an explanation and review of dietary fats and fiber.   Controlling  Sodium/Reading Food Labels: -Group verbal and written material supporting the discussion of sodium use in heart healthy nutrition. Review and explanation with models, verbal and written materials for utilization of the food label.   Exercise Physiology & Risk Factors: - Group verbal and written instruction with models to review the exercise physiology of the cardiovascular system and associated critical values. Details cardiovascular disease risk factors and the goals associated with each risk factor.   Aerobic Exercise & Resistance Training: - Gives group verbal and written discussion on the health impact of inactivity. On the components of aerobic and resistive training programs and the benefits of this training and how to safely progress through these programs.   Flexibility, Balance, General Exercise Guidelines: - Provides group verbal and written instruction on the benefits of flexibility and balance training programs. Provides general exercise guidelines with specific guidelines to those with heart or lung disease. Demonstration and skill practice provided.   Stress Management: - Provides group verbal and written instruction about the health risks of elevated stress, cause of high stress,  and healthy ways to reduce stress.   Depression: - Provides group verbal and written instruction on the correlation between heart/lung disease and depressed mood, treatment options, and the stigmas associated with seeking treatment.   Anatomy & Physiology of the Heart: - Group verbal and written instruction and models provide basic cardiac anatomy and physiology, with the coronary electrical and arterial systems. Review of: AMI, Angina, Valve disease, Heart Failure, Cardiac Arrhythmia, Pacemakers, and the ICD.   Cardiac Procedures: - Group verbal and written instruction and models to describe the testing methods done to diagnose heart disease. Reviews the outcomes of the test results. Describes  the treatment choices: Medical Management, Angioplasty, or Coronary Bypass Surgery.   Cardiac Medications: - Group verbal and written instruction to review commonly prescribed medications for heart disease. Reviews the medication, class of the drug, and side effects. Includes the steps to properly store meds and maintain the prescription regimen.   Go Sex-Intimacy & Heart Disease, Get SMART - Goal Setting: - Group verbal and written instruction through game format to discuss heart disease and the return to sexual intimacy. Provides group verbal and written material to discuss and apply goal setting through the application of the S.M.A.R.T. Method.   Other Matters of the Heart: - Provides group verbal, written materials and models to describe Heart Failure, Angina, Valve Disease, and Diabetes in the realm of heart disease. Includes description of the disease process and treatment options available to the cardiac patient.   Exercise & Equipment Safety: - Individual verbal instruction and demonstration of equipment use and safety with use of the equipment.   Cardiac Rehab from 03/14/2017 in [facility] Cardiac and Pulmonary Rehab  Date  03/14/17  Educator  Sb  Instruction Review Code  2- meets goals/outcomes      Infection Prevention: - Provides verbal and written material to individual with discussion of infection control including proper hand washing and proper equipment cleaning during exercise session.   Cardiac Rehab from 03/14/2017 in [facility] Cardiac and Pulmonary Rehab  Date  03/14/17  Educator  SB  Instruction Review Code  2- meets goals/outcomes      Falls Prevention: - Provides verbal and written material to individual with discussion of falls prevention and safety.   Cardiac Rehab from 03/14/2017 in [facility] Cardiac and Pulmonary Rehab  Date  03/14/17  Educator  SB  Instruction Review Code  2- meets goals/outcomes      Diabetes: - Individual verbal and written instruction to review  signs/symptoms of diabetes, desired ranges of glucose level fasting, after meals and with exercise. Advice that pre and post exercise glucose checks will be done for 3 sessions at entry of program.   Cardiac Rehab from 03/14/2017 in [facility] Cardiac and Pulmonary Rehab  Date  03/14/17  Educator  SB  Instruction Review Code  2- meets goals/outcomes       Knowledge Questionnaire Score:     Knowledge Questionnaire Score - 03/14/17 1400      Knowledge Questionnaire Score   Pre Score 26/28  Reviewed correct responses with return vebalization of understanding.  Explained all topics will be part of the education classes      Core Components/Risk Factors/Patient Goals at Admission:     Personal Goals and Risk Factors at Admission - 03/14/17 1404      Core Components/Risk Factors/Patient Goals on Admission    Weight Management Yes;Weight Loss;Obesity   Intervention Weight Management: Develop a combined nutrition and exercise program designed to reach desired caloric intake, while  maintaining appropriate intake of nutrient and fiber, sodium and fats, and appropriate energy expenditure required for the weight goal.;Weight Management: Provide education and appropriate resources to help participant work on and attain dietary goals.;Weight Management/Obesity: Establish reasonable short term and long term weight goals.   Admit Weight 257 lb 3.2 oz (116.7 kg)   Goal Weight: Short Term 250 lb (113.4 kg)   Goal Weight: Long Term 190 lb (86.2 kg)   Expected Outcomes Short Term: Continue to assess and modify interventions until short term weight is achieved;Long Term: Adherence to nutrition and physical activity/exercise program aimed toward attainment of established weight goal;Weight Loss: Understanding of general recommendations for a balanced deficit meal plan, which promotes 1-2 lb weight loss per week and includes a negative energy balance of [phone] kcal/d;Understanding recommendations for meals to  include 15-35% energy as protein, 25-35% energy from fat, 35-60% energy from carbohydrates, less than 200mg  of dietary cholesterol, 20-35 gm of total fiber daily;Understanding of distribution of calorie intake throughout the day with the consumption of 4-5 meals/snacks   Diabetes Yes  HgbA1C around 7% presently   Intervention Provide education about signs/symptoms and action to take for hypo/hyperglycemia.;Provide education about proper nutrition, including hydration, and aerobic/resistive exercise prescription along with prescribed medications to achieve blood glucose in normal ranges: Fasting glucose 65-99 mg/dL   Expected Outcomes Short Term: Participant verbalizes understanding of the signs/symptoms and immediate care of hyper/hypoglycemia, proper foot care and importance of medication, aerobic/resistive exercise and nutrition plan for blood glucose control.;Long Term: Attainment of HbA1C < 7%.   Hypertension Yes   Intervention Provide education on lifestyle modifcations including regular physical activity/exercise, weight management, moderate sodium restriction and increased consumption of fresh fruit, vegetables, and low fat dairy, alcohol moderation, and smoking cessation.;Monitor prescription use compliance.   Expected Outcomes Short Term: Continued assessment and intervention until BP is < 140/28mm HG in hypertensive participants. < 130/79mm HG in hypertensive participants with diabetes, heart failure or chronic kidney disease.;Long Term: Maintenance of blood pressure at goal levels.   Lipids Yes   Intervention Provide education and support for participant on nutrition & aerobic/resistive exercise along with prescribed medications to achieve LDL 70mg , HDL >40mg .   Expected Outcomes Short Term: Participant states understanding of desired cholesterol values and is compliant with medications prescribed. Participant is following exercise prescription and nutrition guidelines.;Long Term: Cholesterol  controlled with medications as prescribed, with individualized exercise RX and with personalized nutrition plan. Value goals: LDL < 70mg , HDL > 40 mg.      Core Components/Risk Factors/Patient Goals Review:    Core Components/Risk Factors/Patient Goals at Discharge (Final Review):    ITP Comments:     ITP Comments    Row Name 03/14/17 1344 03/16/17 0555         ITP Comments Medical review completed today. Initial ITP created. Documentation of diagnosis can be found Care Everywhere Admission  [id number]/302018 and   Office Visit 01/27/2017 30 day review. Continue with ITP unless directed changes per Medical Director review   New to program         Comments:

## 2017-03-18 ENCOUNTER — Encounter: Payer: 59 | Admitting: *Deleted

## 2017-03-18 DIAGNOSIS — Z48812 Encounter for surgical aftercare following surgery on the circulatory system: Secondary | ICD-10-CM | POA: Diagnosis not present

## 2017-03-18 DIAGNOSIS — G4733 Obstructive sleep apnea (adult) (pediatric): Secondary | ICD-10-CM | POA: Diagnosis not present

## 2017-03-18 DIAGNOSIS — Z955 Presence of coronary angioplasty implant and graft: Secondary | ICD-10-CM

## 2017-03-18 LAB — GLUCOSE, CAPILLARY
GLUCOSE-CAPILLARY: 162 mg/dL — AB (ref 65–99)
Glucose-Capillary: 173 mg/dL — ABNORMAL HIGH (ref 65–99)

## 2017-03-18 NOTE — Progress Notes (Signed)
Daily Session Note  Patient Details  Name: Sharon Arias MRN: [id number] Date of Birth: [birth year] Referring Provider:     Cardiac Rehab from 03/14/2017 in [facility] Cardiac and Pulmonary Rehab  Referring Provider  [name] MD      Encounter Date: 03/18/2017  Check In:     Session Check In - 03/18/17 0917      Check-In   Location [facility]-Cardiac & Pulmonary Rehab   Staff Present Gerlene Burdock, RN, BSN;Susanne Bice, RN, BSN, CCRP;Jessica Luan Pulling, MA, ACSM RCEP, Exercise Physiologist   Supervising physician immediately available to respond to emergencies See telemetry face sheet for immediately available ER MD   Medication changes reported     No   Fall or balance concerns reported    Yes   Warm-up and Cool-down Performed on first and last piece of equipment   Resistance Training Performed Yes   VAD Patient? No     Pain Assessment   Currently in Pain? No/denies         History  Smoking Status  . Never Smoker  Smokeless Tobacco  . Never Used    Goals Met:  Proper associated with RPD/PD & O2 Sat Exercise tolerated well No report of cardiac concerns or symptoms  Goals Unmet:  Not Applicable  Comments:     Dr. Emily Filbert is Medical Director for [place] and [facility].

## 2017-03-21 ENCOUNTER — Encounter: Payer: 59 | Attending: Cardiology | Admitting: *Deleted

## 2017-03-21 DIAGNOSIS — Z955 Presence of coronary angioplasty implant and graft: Secondary | ICD-10-CM | POA: Diagnosis not present

## 2017-03-21 DIAGNOSIS — Z48812 Encounter for surgical aftercare following surgery on the circulatory system: Secondary | ICD-10-CM | POA: Insufficient documentation

## 2017-03-21 NOTE — Progress Notes (Signed)
Daily Session Note  Patient Details  Name: Sharon Arias MRN: [id number] Date of Birth: 1961-06-13 Referring Provider:     Cardiac Rehab from 03/14/2017 in [facility] Cardiac and Pulmonary Rehab  Referring Provider  [name] MD      Encounter Date: 03/21/2017  Check In:     Session Check In - 03/21/17 0754      Check-In   Location [facility]-Cardiac & Pulmonary Rehab   Staff Present Alberteen Sam, MA, ACSM RCEP, Exercise Physiologist;Kelly Amedeo Plenty, BS, ACSM CEP, Exercise Physiologist;Carroll Enterkin, RN, BSN   Supervising physician immediately available to respond to emergencies See telemetry face sheet for immediately available ER MD   Medication changes reported     No   Fall or balance concerns reported    No   Warm-up and Cool-down Performed on first and last piece of equipment   Resistance Training Performed Yes   VAD Patient? No     Pain Assessment   Currently in Pain? No/denies   Multiple Pain Sites No         History  Smoking Status  . Never Smoker  Smokeless Tobacco  . Never Used    Goals Met:  Independence with exercise equipment Exercise tolerated well No report of cardiac concerns or symptoms Strength training completed today  Goals Unmet:  Not Applicable  Comments: Pt able to follow exercise prescription today without complaint.  Will continue to monitor for progression.    Dr. Emily Filbert is Medical Director for [place] and [facility].

## 2017-03-22 LAB — GLUCOSE, CAPILLARY
Glucose-Capillary: 180 mg/dL — ABNORMAL HIGH (ref 65–99)
Glucose-Capillary: 126 mg/dL — ABNORMAL HIGH (ref 65–99)

## 2017-03-23 DIAGNOSIS — Z955 Presence of coronary angioplasty implant and graft: Secondary | ICD-10-CM

## 2017-03-23 DIAGNOSIS — Z48812 Encounter for surgical aftercare following surgery on the circulatory system: Secondary | ICD-10-CM | POA: Diagnosis not present

## 2017-03-23 NOTE — Progress Notes (Signed)
Daily Session Note  Patient Details  Name: Sharon Arias MRN: [id number] Date of Birth: 1961-06-11 Referring Provider:     Cardiac Rehab from 03/14/2017 in [facility]  Referring Provider  [name] MD      Encounter Date: 03/23/2017  Check In:     Session Check In - 03/23/17 0836      Check-In   Location [facility]   Staff Present Heath Lark, RN, BSN, CCRP;Jessica Luan Pulling, MA, ACSM RCEP, Exercise Physiologist;Sharnice Bosler Oletta Darter, BA, ACSM CEP, Exercise Physiologist   Supervising physician immediately available to respond to emergencies See telemetry face sheet for immediately available ER MD   Medication changes reported     No   Fall or balance concerns reported    No   Warm-up and Cool-down Performed on first and last piece of equipment   Resistance Training Performed Yes   VAD Patient? No           Exercise Prescription Changes - 03/22/17 1000      Response to Exercise   Blood Pressure (Admit) 132/70   Blood Pressure (Exercise) 148/80   Blood Pressure (Exit) 126/60   Heart Rate (Admit) 69 bpm   Heart Rate (Exercise) 101 bpm   Heart Rate (Exit) 67 bpm   Rating of Perceived Exertion (Exercise) 13   Symptoms none   Duration Progress to 45 minutes of aerobic exercise without signs/symptoms of physical distress   Intensity THRR unchanged     Progression   Progression Continue to progress workloads to maintain intensity without signs/symptoms of physical distress.   Average METs 2.26     Resistance Training   Training Prescription Yes   Weight 2 lbs   Reps 10-15     Interval Training   Interval Training No     Treadmill   MPH 1.8   Grade 0   Minutes 15   METs 2.32     Recumbant Bike   Level 1   RPM 50   Minutes 15   METs 1.8     NuStep   Level 1   SPM 80   Minutes 15   METs 2.6      History  Smoking Status  . Never Smoker  Smokeless Tobacco  . Never Used    Goals Met:  Independence with  exercise equipment Exercise tolerated well No report of cardiac concerns or symptoms Strength training completed today  Goals Unmet:  Not Applicable  Comments: Pt able to follow exercise prescription today without complaint.  Will continue to monitor for progression.    Dr. Emily Filbert is Medical Director for [place] and [facility].

## 2017-03-28 ENCOUNTER — Telehealth: Payer: Self-pay | Admitting: *Deleted

## 2017-03-28 ENCOUNTER — Encounter: Payer: Self-pay | Admitting: *Deleted

## 2017-03-28 DIAGNOSIS — Z955 Presence of coronary angioplasty implant and graft: Secondary | ICD-10-CM

## 2017-03-28 NOTE — Telephone Encounter (Signed)
Sharon Arias called out with gout again.  She will be out all week per MD request.  She has an appointment to be seen on Friday.

## 2017-04-01 DIAGNOSIS — E041 Nontoxic single thyroid nodule: Secondary | ICD-10-CM | POA: Diagnosis not present

## 2017-04-01 DIAGNOSIS — Z794 Long term (current) use of insulin: Secondary | ICD-10-CM | POA: Diagnosis not present

## 2017-04-01 DIAGNOSIS — E113393 Type 2 diabetes mellitus with moderate nonproliferative diabetic retinopathy without macular edema, bilateral: Secondary | ICD-10-CM | POA: Diagnosis not present

## 2017-04-01 DIAGNOSIS — E1165 Type 2 diabetes mellitus with hyperglycemia: Secondary | ICD-10-CM | POA: Diagnosis not present

## 2017-04-01 DIAGNOSIS — Z79899 Other long term (current) drug therapy: Secondary | ICD-10-CM | POA: Diagnosis not present

## 2017-04-06 ENCOUNTER — Encounter: Payer: Self-pay | Admitting: *Deleted

## 2017-04-06 ENCOUNTER — Telehealth: Payer: Self-pay | Admitting: *Deleted

## 2017-04-06 DIAGNOSIS — Z955 Presence of coronary angioplasty implant and graft: Secondary | ICD-10-CM

## 2017-04-06 NOTE — Telephone Encounter (Signed)
Called to check on status of return.  Left message on voicemail.

## 2017-04-11 DIAGNOSIS — Z794 Long term (current) use of insulin: Secondary | ICD-10-CM | POA: Diagnosis not present

## 2017-04-11 DIAGNOSIS — E113513 Type 2 diabetes mellitus with proliferative diabetic retinopathy with macular edema, bilateral: Secondary | ICD-10-CM | POA: Diagnosis not present

## 2017-04-13 ENCOUNTER — Encounter: Payer: Self-pay | Admitting: *Deleted

## 2017-04-13 DIAGNOSIS — Z955 Presence of coronary angioplasty implant and graft: Secondary | ICD-10-CM

## 2017-04-13 NOTE — Progress Notes (Signed)
Cardiac Individual Treatment Plan  Patient Details  Name: ROSAMUND NYLAND MRN: [id number] Date of Birth: Nov 04, 1961 Referring Provider:     Cardiac Rehab from 03/14/2017 in [facility] Cardiac and Pulmonary Rehab  Referring Provider  [name] MD      Initial Encounter Date:    Cardiac Rehab from 03/14/2017 in [facility] Cardiac and Pulmonary Rehab  Date  03/14/17  Referring Provider  [name] MD      Visit Diagnosis: Status post coronary artery stent placement  Patient's Home Medications on Admission:  Current Outpatient Prescriptions:  .  allopurinol (ZYLOPRIM) 100 MG tablet, Take by mouth., Disp: , Rfl:  .  amLODipine (NORVASC) 10 MG tablet, , Disp: , Rfl:  .  aspirin EC 81 MG tablet, Take by mouth., Disp: , Rfl:  .  atorvastatin (LIPITOR) 40 MG tablet, Take by mouth., Disp: , Rfl:  .  furosemide (LASIX) 40 MG tablet, Take 1 tablet by mouth  daily, Disp: , Rfl:  .  Insulin Glargine (LANTUS SOLOSTAR) 100 UNIT/ML Solostar Pen, Inject subcutaneously 74  units at bedtime ( Split  into 2 shots of 37 units  each at 2 different sites ), Disp: , Rfl:  .  insulin lispro (HUMALOG KWIKPEN) 100 UNIT/ML KiwkPen, Inject subcutaneously 22 units with breakfast,24 units with lunch,and 32 units with dinner, plus sliding scale as directed., Disp: , Rfl:  .  losartan (COZAAR) 100 MG tablet, , Disp: , Rfl:  .  metoprolol (LOPRESSOR) 50 MG tablet, Take 1 tablet by mouth two  times daily, Disp: , Rfl:  .  ticagrelor (BRILINTA) 90 MG TABS tablet, Take by mouth., Disp: , Rfl:   Past Medical History: Past Medical History:  Diagnosis Date  . Anemia in chronic kidney disease 05/19/2016  . CAD (coronary artery disease)   . Chicken pox   . Chronic kidney disease    per dr sparks  . CVA (cerebral vascular accident) ([place])   . Detached retina   . Diabetes mellitus without complication ([place])    type 2  . Diabetic neuropathy ([place])   . Diabetic retinopathy ([place])    legally blind  . Hyperlipidemia    . Hypertension   . Myocardial infarction   . Obesity   . PONV (postoperative nausea and vomiting)   . Sciatica of right side    going to see physiciatry    Tobacco Use: History  Smoking Status  . Never Smoker  Smokeless Tobacco  . Never Used    Labs: Recent Review Flowsheet Data    There is no flowsheet data to display.       Exercise Target Goals:    Exercise Program Goal: Individual exercise prescription set with THRR, safety & activity barriers. Participant demonstrates ability to understand and report RPE using BORG scale, to self-measure pulse accurately, and to acknowledge the importance of the exercise prescription.  Exercise Prescription Goal: Starting with aerobic activity 30 plus minutes a day, 3 days per week for initial exercise prescription. Provide home exercise prescription and guidelines that participant acknowledges understanding prior to discharge.  Activity Barriers & Risk Stratification:     Activity Barriers & Cardiac Risk Stratification - 03/14/17 1405      Activity Barriers & Cardiac Risk Stratification   Activity Barriers Back Problems;Other (comment);Balance Concerns;Muscular Weakness;Deconditioning;Shortness of Breath   Comments GOUT, Vision Loss, painful to walk   Cardiac Risk Stratification High      6 Minute Walk:     6 Minute Walk  [place] Name 03/14/17 1514         6 Minute Walk   Phase Initial     Distance 975 feet     Walk Time 4.77 minutes     # of Rest Breaks 1  rested 1:14      MPH 2.32     METS 2.35     RPE 15     Perceived Dyspnea  3     VO2 Peak 8.21     Symptoms Yes (comment)     Comments dizzy and lightheaded, SOB     Resting HR 62 bpm     Resting BP 128/64     Max Ex. HR 93 bpm     Max Ex. BP 126/54     2 Minute Post BP 116/60        Oxygen Initial Assessment:   Oxygen Re-Evaluation:   Oxygen Discharge (Final Oxygen Re-Evaluation):   Initial Exercise Prescription:     Initial Exercise  Prescription - 03/14/17 1500      Date of Initial Exercise RX and Referring Provider   Date 03/14/17   Referring Provider [name] MD     Treadmill   MPH 1.8   Grade 0   Minutes 15   METs 2.32     Recumbant Bike   Level 1   RPM 50   Minutes 15   METs 2     NuStep   Level 1   SPM 80   Minutes 15   METs 2     Prescription Details   Frequency (times per week) 3   Duration Progress to 45 minutes of aerobic exercise without signs/symptoms of physical distress     Intensity   THRR 40-80% of Max Heartrate 103-144   Ratings of Perceived Exertion 11-13   Perceived Dyspnea 0-4     Progression   Progression Continue to progress workloads to maintain intensity without signs/symptoms of physical distress.     Resistance Training   Training Prescription Yes   Weight 2 lbs   Reps 10-15      Perform Capillary Blood Glucose checks as needed.  Exercise Prescription Changes:     Exercise Prescription Changes    Row Name 03/14/17 1500 03/22/17 1000 04/07/17 1100         Response to Exercise   Blood Pressure (Admit) 128/64 132/70 144/78     Blood Pressure (Exercise) 126/54 148/80 136/64     Blood Pressure (Exit) 116/60 126/60 132/78     Heart Rate (Admit) 62 bpm 69 bpm 74 bpm     Heart Rate (Exercise) 93 bpm 101 bpm 75 bpm     Heart Rate (Exit) 67 bpm 67 bpm 67 bpm     Oxygen Saturation (Admit) 100 %  -  -     Oxygen Saturation (Exercise) 99 %  -  -     Rating of Perceived Exertion (Exercise) _0 Perceived Dyspnea (Exercise) 3  -  -     Symptoms dizzy, lightheaded, SON none none     Comments walk test results  - has been out since 4/4 with gout     Duration  - Progress to 45 minutes of aerobic exercise without signs/symptoms of physical distress Progress to 45 minutes of aerobic exercise without signs/symptoms of physical distress     Intensity  - THRR unchanged THRR unchanged       Progression   Progression  - Continue to  progress workloads to  maintain intensity without signs/symptoms of physical distress. Continue to progress workloads to maintain intensity without signs/symptoms of physical distress.     Average METs  - 2.26 2.39       Resistance Training   Training Prescription  - Yes Yes     Weight  - 2 lbs 2 lbs     Reps  - 10-15 10-15       Interval Training   Interval Training  - No No       Treadmill   MPH  - 1.8 1.8     Grade  - 0 0     Minutes  - 15 15     METs  - 2.32 2.32       Recumbant Bike   Level  - 1  -     RPM  - 50  -     Minutes  - 15  -     METs  - 1.8  -       NuStep   Level  - 1 1     SPM  - 80 80     Minutes  - 15 15     METs  - 2.6 2.4        Exercise Comments:     Exercise Comments    Row Name 03/16/17 0914 03/23/17 4970         Exercise Comments First full day of exercise!  Patient was oriented to gym and equipment including functions, settings, policies, and procedures.  Patient's individual exercise prescription and treatment plan were reviewed.  All starting workloads were established based on the results of the 6 minute walk test done at initial orientation visit.  The plan for exercise progression was also introduced and progression will be customized based on patient's performance and goals. Brandye has back pain - has had testing for disc problems etc - no issues.  I recommended the back pain class at the [facility] and spoke with her about how to find a good massage therapist and gave the number for [facility].         Exercise Goals and Review:     Exercise Goals    Row Name 03/14/17 1518             Exercise Goals   Increase Physical Activity Yes  get into exercise routine       Intervention Provide advice, education, support and counseling about physical activity/exercise needs.;Develop an individualized exercise prescription for aerobic and resistive training based on initial evaluation findings, risk stratification, comorbidities and participant's personal  goals.       Expected Outcomes Achievement of increased cardiorespiratory fitness and enhanced flexibility, muscular endurance and strength shown through measurements of functional capacity and personal statement of participant.       Increase Strength and Stamina Yes  increase energy       Intervention Provide advice, education, support and counseling about physical activity/exercise needs.;Develop an individualized exercise prescription for aerobic and resistive training based on initial evaluation findings, risk stratification, comorbidities and participant's personal goals.       Expected Outcomes Achievement of increased cardiorespiratory fitness and enhanced flexibility, muscular endurance and strength shown through measurements of functional capacity and personal statement of participant.          Exercise Goals Re-Evaluation :     Exercise Goals Re-Evaluation    Row Name 03/22/17 1036 04/07/17 1135  Exercise Goal Re-Evaluation   Exercise Goals Review Increase Physical Activity;Increase Strenth and Stamina Increase Physical Activity;Increase Strenth and Stamina      Comments Dennice is off to a good start with rehab.  She has been able to get around on her own and just needs help to make sure her weight and settings are correct.  She feels that she is getting a good workout during rehab.  We will continue to monitor her progression. Out since 4/4 with gout, no progression noted.  We will continue to monitor      Expected Outcomes Short: Jetaun will start to increase some workloads.  Long: Continue to come to classes to work on strength and stamina. Short and Long: Able to return to rehab with improved attendance to work on strength and stamina.         Discharge Exercise Prescription (Final Exercise Prescription Changes):     Exercise Prescription Changes - 04/07/17 1100      Response to Exercise   Blood Pressure (Admit) 144/78   Blood Pressure (Exercise) 136/64    Blood Pressure (Exit) 132/78   Heart Rate (Admit) 74 bpm   Heart Rate (Exercise) 75 bpm   Heart Rate (Exit) 67 bpm   Rating of Perceived Exertion (Exercise) 15   Symptoms none   Comments has been out since 4/4 with gout   Duration Progress to 45 minutes of aerobic exercise without signs/symptoms of physical distress   Intensity THRR unchanged     Progression   Progression Continue to progress workloads to maintain intensity without signs/symptoms of physical distress.   Average METs 2.39     Resistance Training   Training Prescription Yes   Weight 2 lbs   Reps 10-15     Interval Training   Interval Training No     Treadmill   MPH 1.8   Grade 0   Minutes 15   METs 2.32     NuStep   Level 1   SPM 80   Minutes 15   METs 2.4      Nutrition:  Target Goals: Understanding of nutrition guidelines, daily intake of sodium '1500mg'$ , cholesterol '200mg'$ , calories 30% from fat and 7% or less from saturated fats, daily to have 5 or more servings of fruits and vegetables.  Biometrics:     Pre Biometrics - 03/14/17 1518      Pre Biometrics   Height 5' 8.5" (1.74 m)   Weight 257 lb 3.2 oz (116.7 kg)   Waist Circumference 48 inches   Hip Circumference 50 inches   Waist to Hip Ratio 0.96 %   BMI (Calculated) 38.6   Single Leg Stand 3.43 seconds       Nutrition Therapy Plan and Nutrition Goals:     Nutrition Therapy & Goals - 03/14/17 1351      Intervention Plan   Intervention Prescribe, educate and counsel regarding individualized specific dietary modifications aiming towards targeted core components such as weight, hypertension, lipid management, diabetes, heart failure and other comorbidities.   Expected Outcomes Short Term Goal: Understand basic principles of dietary content, such as calories, fat, sodium, cholesterol and nutrients.;Short Term Goal: A plan has been developed with personal nutrition goals set during dietitian appointment.;Long Term Goal: Adherence to  prescribed nutrition plan.      Nutrition Discharge: Rate Your Plate Scores:     Nutrition Assessments - 03/14/17 1401      MEDFICTS Scores   Pre Score 9      Nutrition  Goals Re-Evaluation:   Nutrition Goals Discharge (Final Nutrition Goals Re-Evaluation):   Psychosocial: Target Goals: Acknowledge presence or absence of significant depression and/or stress, maximize coping skills, provide positive support system. Participant is able to verbalize types and ability to use techniques and skills needed for reducing stress and depression.   Initial Review & Psychosocial Screening:     Initial Psych Review & Screening - 03/14/17 1355      Initial Review   Current issues with Current Sleep Concerns  New not getting to sleep and able to stay asleep     Wisdom? Yes  Husband and adult children     Barriers   Psychosocial barriers to participate in program There are no identifiable barriers or psychosocial needs.;The patient should benefit from training in stress management and relaxation.     Screening Interventions   Interventions Encouraged to exercise      Quality of Life Scores:      Quality of Life - 03/14/17 1356      Quality of Life Scores   Health/Function Pre 17.43 %   Socioeconomic Pre 23.71 %   Psych/Spiritual Pre 26 %   Family Pre 25.2 %   GLOBAL Pre 21.63 %      PHQ-9: Recent Review Flowsheet Data    Depression screen [facility] 2/9 03/14/2017   Decreased Interest 0   Down, Depressed, Hopeless 0   PHQ - 2 Score 0   Altered sleeping 2    Tired, decreased energy 3    Change in appetite 0   Feeling bad or failure about yourself  0   Trouble concentrating 0   Moving slowly or fidgety/restless 0   Suicidal thoughts 0   PHQ-9 Score 5   Difficult doing work/chores Somewhat difficult     Interpretation of Total Score  Total Score Depression Severity:  1-4 = Minimal depression, 5-9 = Mild depression, 10-14 = Moderate  depression, 15-19 = Moderately severe depression, 20-27 = Severe depression   Psychosocial Evaluation and Intervention:     Psychosocial Evaluation - 03/21/17 0946      Psychosocial Evaluation & Interventions   Comments Counselor met with Nysa today for initial psychosocial evaluation.  She is a 56 year old who had triple by-pass 5 years ago and a stent inserted one month ago.  She has a strong support system with a spouse of 35 years and a daughter who lives close by.  Tyniesha has multiple health issues with diabetes; poor kidney function; vision issues and anemia.  She sleeps well and has a good appetite.  Christy denies a history of depression or anxiety, but has reported several incidents over the past two years that resembled a panic attack and states that she sometimes has difficulty falling asleep due to worrying about her grandkids. She is typically in a positive mood and reports minimal stress; other than worrying about her grandchildren and not being able to drive because of her vision.  Caela has goals to increase her stamina and strength and just "get more energy" while in this program.  She plans to continue exercising by using her treadmill and bike at home.  Staff will continue to follow with Rosangela throughout the course of this program.     Expected Outcomes Cay will exercise consistently and increase her energy.  She will also participate in the psychoeducational components of this program in order to learn how to relax and manage stress and anxiety better in her  life.        Psychosocial Re-Evaluation:   Psychosocial Discharge (Final Psychosocial Re-Evaluation):   Vocational Rehabilitation: Provide vocational rehab assistance to qualifying candidates.   Vocational Rehab Evaluation & Intervention:     Vocational Rehab - 03/14/17 1401      Initial Vocational Rehab Evaluation & Intervention   Assessment shows need for Vocational Rehabilitation No       Education: Education Goals: Education classes will be provided on a weekly basis, covering required topics. Participant will state understanding/return demonstration of topics presented.  Learning Barriers/Preferences:     Learning Barriers/Preferences - 03/14/17 1359      Learning Barriers/Preferences   Learning Barriers Sight  unable to see secondary to eye disease   Learning Preferences Audio;Verbal Instruction      Education Topics: General Nutrition Guidelines/Fats and Fiber: -Group instruction provided by verbal, written material, models and posters to present the general guidelines for heart healthy nutrition. Gives an explanation and review of dietary fats and fiber.   Controlling Sodium/Reading Food Labels: -Group verbal and written material supporting the discussion of sodium use in heart healthy nutrition. Review and explanation with models, verbal and written materials for utilization of the food label.   Exercise Physiology & Risk Factors: - Group verbal and written instruction with models to review the exercise physiology of the cardiovascular system and associated critical values. Details cardiovascular disease risk factors and the goals associated with each risk factor.   Aerobic Exercise & Resistance Training: - Gives group verbal and written discussion on the health impact of inactivity. On the components of aerobic and resistive training programs and the benefits of this training and how to safely progress through these programs.   Flexibility, Balance, General Exercise Guidelines: - Provides group verbal and written instruction on the benefits of flexibility and balance training programs. Provides general exercise guidelines with specific guidelines to those with heart or lung disease. Demonstration and skill practice provided.   Stress Management: - Provides group verbal and written instruction about the health risks of elevated stress, cause of high  stress, and healthy ways to reduce stress.   Cardiac Rehab from 03/23/2017 in [facility] Cardiac and Pulmonary Rehab  Date  03/23/17  Educator  [facility]  Instruction Review Code  2- meets goals/outcomes      Depression: - Provides group verbal and written instruction on the correlation between heart/lung disease and depressed mood, treatment options, and the stigmas associated with seeking treatment.   Anatomy & Physiology of the Heart: - Group verbal and written instruction and models provide basic cardiac anatomy and physiology, with the coronary electrical and arterial systems. Review of: AMI, Angina, Valve disease, Heart Failure, Cardiac Arrhythmia, Pacemakers, and the ICD.   Cardiac Rehab from 03/23/2017 in [facility] Cardiac and Pulmonary Rehab  Date  03/21/17  Educator  CE  Instruction Review Code  2- meets goals/outcomes      Cardiac Procedures: - Group verbal and written instruction and models to describe the testing methods done to diagnose heart disease. Reviews the outcomes of the test results. Describes the treatment choices: Medical Management, Angioplasty, or Coronary Bypass Surgery.   Cardiac Medications: - Group verbal and written instruction to review commonly prescribed medications for heart disease. Reviews the medication, class of the drug, and side effects. Includes the steps to properly store meds and maintain the prescription regimen.   Go Sex-Intimacy & Heart Disease, Get SMART - Goal Setting: - Group verbal and written instruction through game format to discuss heart  disease and the return to sexual intimacy. Provides group verbal and written material to discuss and apply goal setting through the application of the S.M.A.R.T. Method.   Other Matters of the Heart: - Provides group verbal, written materials and models to describe Heart Failure, Angina, Valve Disease, and Diabetes in the realm of heart disease. Includes description of the disease process and treatment options  available to the cardiac patient.   Cardiac Rehab from 03/23/2017 in [facility] Cardiac and Pulmonary Rehab  Date  03/21/17  Educator  CE  Instruction Review Code  2- meets goals/outcomes      Exercise & Equipment Safety: - Individual verbal instruction and demonstration of equipment use and safety with use of the equipment.   Cardiac Rehab from 03/23/2017 in [facility] Cardiac and Pulmonary Rehab  Date  03/14/17  Educator  Sb  Instruction Review Code  2- meets goals/outcomes      Infection Prevention: - Provides verbal and written material to individual with discussion of infection control including proper hand washing and proper equipment cleaning during exercise session.   Cardiac Rehab from 03/23/2017 in [facility] Cardiac and Pulmonary Rehab  Date  03/14/17  Educator  SB  Instruction Review Code  2- meets goals/outcomes      Falls Prevention: - Provides verbal and written material to individual with discussion of falls prevention and safety.   Cardiac Rehab from 03/23/2017 in [facility] Cardiac and Pulmonary Rehab  Date  03/14/17  Educator  SB  Instruction Review Code  2- meets goals/outcomes      Diabetes: - Individual verbal and written instruction to review signs/symptoms of diabetes, desired ranges of glucose level fasting, after meals and with exercise. Advice that pre and post exercise glucose checks will be done for 3 sessions at entry of program.   Cardiac Rehab from 03/23/2017 in [facility] Cardiac and Pulmonary Rehab  Date  03/14/17  Educator  SB  Instruction Review Code  2- meets goals/outcomes       Knowledge Questionnaire Score:     Knowledge Questionnaire Score - 03/14/17 1400      Knowledge Questionnaire Score   Pre Score 26/28  Reviewed correct responses with return vebalization of understanding.  Explained all topics will be part of the education classes      Core Components/Risk Factors/Patient Goals at Admission:     Personal Goals and Risk Factors at Admission - 03/14/17  1404      Core Components/Risk Factors/Patient Goals on Admission    Weight Management Yes;Weight Loss;Obesity   Intervention Weight Management: Develop a combined nutrition and exercise program designed to reach desired caloric intake, while maintaining appropriate intake of nutrient and fiber, sodium and fats, and appropriate energy expenditure required for the weight goal.;Weight Management: Provide education and appropriate resources to help participant work on and attain dietary goals.;Weight Management/Obesity: Establish reasonable short term and long term weight goals.   Admit Weight 257 lb 3.2 oz (116.7 kg)   Goal Weight: Short Term 250 lb (113.4 kg)   Goal Weight: Long Term 190 lb (86.2 kg)   Expected Outcomes Short Term: Continue to assess and modify interventions until short term weight is achieved;Long Term: Adherence to nutrition and physical activity/exercise program aimed toward attainment of established weight goal;Weight Loss: Understanding of general recommendations for a balanced deficit meal plan, which promotes 1-2 lb weight loss per week and includes a negative energy balance of [phone] kcal/d;Understanding recommendations for meals to include 15-35% energy as protein, 25-35% energy from fat, 35-60% energy  from carbohydrates, less than '200mg'$  of dietary cholesterol, 20-35 gm of total fiber daily;Understanding of distribution of calorie intake throughout the day with the consumption of 4-5 meals/snacks   Diabetes Yes  HgbA1C around 7% presently   Intervention Provide education about signs/symptoms and action to take for hypo/hyperglycemia.;Provide education about proper nutrition, including hydration, and aerobic/resistive exercise prescription along with prescribed medications to achieve blood glucose in normal ranges: Fasting glucose 65-99 mg/dL   Expected Outcomes Short Term: Participant verbalizes understanding of the signs/symptoms and immediate care of hyper/hypoglycemia,  proper foot care and importance of medication, aerobic/resistive exercise and nutrition plan for blood glucose control.;Long Term: Attainment of HbA1C < 7%.   Hypertension Yes   Intervention Provide education on lifestyle modifcations including regular physical activity/exercise, weight management, moderate sodium restriction and increased consumption of fresh fruit, vegetables, and low fat dairy, alcohol moderation, and smoking cessation.;Monitor prescription use compliance.   Expected Outcomes Short Term: Continued assessment and intervention until BP is < 140/28m HG in hypertensive participants. < 130/882mHG in hypertensive participants with diabetes, heart failure or chronic kidney disease.;Long Term: Maintenance of blood pressure at goal levels.   Lipids Yes   Intervention Provide education and support for participant on nutrition & aerobic/resistive exercise along with prescribed medications to achieve LDL '70mg'$ , HDL >'40mg'$ .   Expected Outcomes Short Term: Participant states understanding of desired cholesterol values and is compliant with medications prescribed. Participant is following exercise prescription and nutrition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

## 2017-04-18 DIAGNOSIS — H25811 Combined forms of age-related cataract, right eye: Secondary | ICD-10-CM | POA: Diagnosis not present

## 2017-04-18 DIAGNOSIS — H25011 Cortical age-related cataract, right eye: Secondary | ICD-10-CM | POA: Diagnosis not present

## 2017-04-18 DIAGNOSIS — Z794 Long term (current) use of insulin: Secondary | ICD-10-CM | POA: Diagnosis not present

## 2017-04-18 DIAGNOSIS — H2511 Age-related nuclear cataract, right eye: Secondary | ICD-10-CM | POA: Diagnosis not present

## 2017-04-18 DIAGNOSIS — E119 Type 2 diabetes mellitus without complications: Secondary | ICD-10-CM | POA: Diagnosis not present

## 2017-04-20 ENCOUNTER — Encounter: Payer: 59 | Attending: Cardiology

## 2017-04-20 DIAGNOSIS — Z955 Presence of coronary angioplasty implant and graft: Secondary | ICD-10-CM | POA: Insufficient documentation

## 2017-04-20 DIAGNOSIS — Z48812 Encounter for surgical aftercare following surgery on the circulatory system: Secondary | ICD-10-CM | POA: Insufficient documentation

## 2017-04-21 ENCOUNTER — Encounter: Payer: Self-pay | Admitting: *Deleted

## 2017-04-21 DIAGNOSIS — Z955 Presence of coronary angioplasty implant and graft: Secondary | ICD-10-CM

## 2017-04-26 DIAGNOSIS — I214 Non-ST elevation (NSTEMI) myocardial infarction: Secondary | ICD-10-CM | POA: Diagnosis not present

## 2017-04-26 DIAGNOSIS — I639 Cerebral infarction, unspecified: Secondary | ICD-10-CM | POA: Diagnosis not present

## 2017-04-26 DIAGNOSIS — R011 Cardiac murmur, unspecified: Secondary | ICD-10-CM | POA: Insufficient documentation

## 2017-04-26 DIAGNOSIS — I10 Essential (primary) hypertension: Secondary | ICD-10-CM | POA: Diagnosis not present

## 2017-04-27 ENCOUNTER — Encounter: Payer: Self-pay | Admitting: *Deleted

## 2017-04-27 ENCOUNTER — Telehealth: Payer: Self-pay | Admitting: *Deleted

## 2017-04-27 DIAGNOSIS — Z955 Presence of coronary angioplasty implant and graft: Secondary | ICD-10-CM

## 2017-04-27 NOTE — Telephone Encounter (Signed)
Called to check on status of return post cataract/retina surgery.  Next Friday 5/18 is follow up visit and then will be to return for short time.  Then on June 1 she will have a pre-op for other eye to done.

## 2017-05-02 ENCOUNTER — Encounter: Payer: Self-pay | Admitting: *Deleted

## 2017-05-02 DIAGNOSIS — Z955 Presence of coronary angioplasty implant and graft: Secondary | ICD-10-CM

## 2017-05-11 ENCOUNTER — Encounter: Payer: Self-pay | Admitting: *Deleted

## 2017-05-11 DIAGNOSIS — Z955 Presence of coronary angioplasty implant and graft: Secondary | ICD-10-CM

## 2017-05-11 NOTE — Progress Notes (Signed)
Cardiac Individual Treatment Plan  Patient Details  Name: Sharon Arias MRN: [id number] Date of Birth: Nov 04, 1961 Referring Provider:     Cardiac Rehab from 03/14/2017 in [facility] Cardiac [facility] Pulmonary Rehab  Referring Provider  [name] MD      Initial Encounter Date:    Cardiac Rehab from 03/14/2017 in [facility] Cardiac and Pulmonary Rehab  Date  03/14/17  Referring Provider  [name] MD      Visit Diagnosis: Status post coronary artery stent placement  Patient's Home Medications on Admission:  Current Outpatient Prescriptions:  .  allopurinol (ZYLOPRIM) 100 MG tablet, Take by mouth., Disp: , Rfl:  .  amLODipine (NORVASC) 10 MG tablet, , Disp: , Rfl:  .  aspirin EC 81 MG tablet, Take by mouth., Disp: , Rfl:  .  atorvastatin (LIPITOR) 40 MG tablet, Take by mouth., Disp: , Rfl:  .  furosemide (LASIX) 40 MG tablet, Take 1 tablet by mouth  daily, Disp: , Rfl:  .  Insulin Glargine (LANTUS SOLOSTAR) 100 UNIT/ML Solostar Pen, Inject subcutaneously 74  units at bedtime ( Split  into 2 shots of 37 units  each at 2 different sites ), Disp: , Rfl:  .  insulin lispro (HUMALOG KWIKPEN) 100 UNIT/ML KiwkPen, Inject subcutaneously 22 units with breakfast,24 units with lunch,and 32 units with dinner, plus sliding scale as directed., Disp: , Rfl:  .  losartan (COZAAR) 100 MG tablet, , Disp: , Rfl:  .  metoprolol (LOPRESSOR) 50 MG tablet, Take 1 tablet by mouth two  times daily, Disp: , Rfl:  .  ticagrelor (BRILINTA) 90 MG TABS tablet, Take by mouth., Disp: , Rfl:   Past Medical History: Past Medical History:  Diagnosis Date  . Anemia in chronic kidney disease 05/19/2016  . CAD (coronary artery disease)   . Chicken pox   . Chronic kidney disease    per dr sparks  . CVA (cerebral vascular accident) ([place])   . Detached retina   . Diabetes mellitus without complication ([place])    type 2  . Diabetic neuropathy ([place])   . Diabetic retinopathy ([place])    legally blind  . Hyperlipidemia    . Hypertension   . Myocardial infarction   . Obesity   . PONV (postoperative nausea and vomiting)   . Sciatica of right side    going to see physiciatry    Tobacco Use: History  Smoking Status  . Never Smoker  Smokeless Tobacco  . Never Used    Labs: Recent Review Flowsheet Data    There is no flowsheet data to display.       Exercise Target Goals:    Exercise Program Goal: Individual exercise prescription set with THRR, safety & activity barriers. Participant demonstrates ability to understand and report RPE using BORG scale, to self-measure pulse accurately, and to acknowledge the importance of the exercise prescription.  Exercise Prescription Goal: Starting with aerobic activity 30 plus minutes a day, 3 days per week for initial exercise prescription. Provide home exercise prescription and guidelines that participant acknowledges understanding prior to discharge.  Activity Barriers & Risk Stratification:     Activity Barriers & Cardiac Risk Stratification - 03/14/17 1405      Activity Barriers & Cardiac Risk Stratification   Activity Barriers Back Problems;Other (comment);Balance Concerns;Muscular Weakness;Deconditioning;Shortness of Breath   Comments GOUT, Vision Loss, painful to walk   Cardiac Risk Stratification High      6 Minute Walk:     6 Minute Walk  [place] Name 03/14/17 1514         6 Minute Walk   Phase Initial     Distance 975 feet     Walk Time 4.77 minutes     # of Rest Breaks 1  rested 1:14      MPH 2.32     METS 2.35     RPE 15     Perceived Dyspnea  3     VO2 Peak 8.21     Symptoms Yes (comment)     Comments dizzy and lightheaded, SOB     Resting HR 62 bpm     Resting BP 128/64     Max Ex. HR 93 bpm     Max Ex. BP 126/54     2 Minute Post BP 116/60        Oxygen Initial Assessment:   Oxygen Re-Evaluation:   Oxygen Discharge (Final Oxygen Re-Evaluation):   Initial Exercise Prescription:     Initial Exercise  Prescription - 03/14/17 1500      Date of Initial Exercise RX and Referring Provider   Date 03/14/17   Referring Provider [name] MD     Treadmill   MPH 1.8   Grade 0   Minutes 15   METs 2.32     Recumbant Bike   Level 1   RPM 50   Minutes 15   METs 2     NuStep   Level 1   SPM 80   Minutes 15   METs 2     Prescription Details   Frequency (times per week) 3   Duration Progress to 45 minutes of aerobic exercise without signs/symptoms of physical distress     Intensity   THRR 40-80% of Max Heartrate 103-144   Ratings of Perceived Exertion 11-13   Perceived Dyspnea 0-4     Progression   Progression Continue to progress workloads to maintain intensity without signs/symptoms of physical distress.     Resistance Training   Training Prescription Yes   Weight 2 lbs   Reps 10-15      Perform Capillary Blood Glucose checks as needed.  Exercise Prescription Changes:     Exercise Prescription Changes    Row Name 03/14/17 1500 03/22/17 1000 04/07/17 1100         Response to Exercise   Blood Pressure (Admit) 128/64 132/70 144/78     Blood Pressure (Exercise) 126/54 148/80 136/64     Blood Pressure (Exit) 116/60 126/60 132/78     Heart Rate (Admit) 62 bpm 69 bpm 74 bpm     Heart Rate (Exercise) 93 bpm 101 bpm 75 bpm     Heart Rate (Exit) 67 bpm 67 bpm 67 bpm     Oxygen Saturation (Admit) 100 %  -  -     Oxygen Saturation (Exercise) 99 %  -  -     Rating of Perceived Exertion (Exercise) _0 Perceived Dyspnea (Exercise) 3  -  -     Symptoms dizzy, lightheaded, SON none none     Comments walk test results  - has been out since 4/4 with gout     Duration  - Progress to 45 minutes of aerobic exercise without signs/symptoms of physical distress Progress to 45 minutes of aerobic exercise without signs/symptoms of physical distress     Intensity  - THRR unchanged THRR unchanged       Progression   Progression  - Continue to  progress workloads to  maintain intensity without signs/symptoms of physical distress. Continue to progress workloads to maintain intensity without signs/symptoms of physical distress.     Average METs  - 2.26 2.39       Resistance Training   Training Prescription  - Yes Yes     Weight  - 2 lbs 2 lbs     Reps  - 10-15 10-15       Interval Training   Interval Training  - No No       Treadmill   MPH  - 1.8 1.8     Grade  - 0 0     Minutes  - 15 15     METs  - 2.32 2.32       Recumbant Bike   Level  - 1  -     RPM  - 50  -     Minutes  - 15  -     METs  - 1.8  -       NuStep   Level  - 1 1     SPM  - 80 80     Minutes  - 15 15     METs  - 2.6 2.4        Exercise Comments:     Exercise Comments    Row Name 03/16/17 0914 03/23/17 3500 04/21/17 1129       Exercise Comments First full day of exercise!  Patient was oriented to gym and equipment including functions, settings, policies, and procedures.  Patient's individual exercise prescription and treatment plan were reviewed.  All starting workloads were established based on the results of the 6 minute walk test done at initial orientation visit.  The plan for exercise progression was also introduced and progression will be customized based on patient's performance and goals. Yehudis has back pain - has had testing for disc problems etc - no issues.  I recommended the back pain class at the [facility] and spoke with her about how to find a good massage therapist and gave the number for [facility]. Tiann is out on medical leave.  She had cataract surgery on 4/30.  She will return once cleared to exercise.  Last visit was 03/23/17.        Exercise Goals and Review:     Exercise Goals    Row Name 03/14/17 1518             Exercise Goals   Increase Physical Activity Yes  get into exercise routine       Intervention Provide advice, education, support and counseling about physical activity/exercise needs.;Develop an individualized exercise  prescription for aerobic and resistive training based on initial evaluation findings, risk stratification, comorbidities and participant's personal goals.       Expected Outcomes Achievement of increased cardiorespiratory fitness and enhanced flexibility, muscular endurance and strength shown through measurements of functional capacity and personal statement of participant.       Increase Strength and Stamina Yes  increase energy       Intervention Provide advice, education, support and counseling about physical activity/exercise needs.;Develop an individualized exercise prescription for aerobic and resistive training based on initial evaluation findings, risk stratification, comorbidities and participant's personal goals.       Expected Outcomes Achievement of increased cardiorespiratory fitness and enhanced flexibility, muscular endurance and strength shown through measurements of functional capacity and personal statement of participant.          Exercise  Goals Re-Evaluation :     Exercise Goals Re-Evaluation    Row Name 03/22/17 1036 04/07/17 1135           Exercise Goal Re-Evaluation   Exercise Goals Review Increase Physical Activity;Increase Strenth and Stamina Increase Physical Activity;Increase Strenth and Stamina      Comments Janeece is off to a good start with rehab.  She has been able to get around on her own and just needs help to make sure her weight and settings are correct.  She feels that she is getting a good workout during rehab.  We will continue to monitor her progression. Out since 4/4 with gout, no progression noted.  We will continue to monitor      Expected Outcomes Short: Jaleeya will start to increase some workloads.  Long: Continue to come to classes to work on strength and stamina. Short and Long: Able to return to rehab with improved attendance to work on strength and stamina.         Discharge Exercise Prescription (Final Exercise Prescription Changes):      Exercise Prescription Changes - 04/07/17 1100      Response to Exercise   Blood Pressure (Admit) 144/78   Blood Pressure (Exercise) 136/64   Blood Pressure (Exit) 132/78   Heart Rate (Admit) 74 bpm   Heart Rate (Exercise) 75 bpm   Heart Rate (Exit) 67 bpm   Rating of Perceived Exertion (Exercise) 15   Symptoms none   Comments has been out since 4/4 with gout   Duration Progress to 45 minutes of aerobic exercise without signs/symptoms of physical distress   Intensity THRR unchanged     Progression   Progression Continue to progress workloads to maintain intensity without signs/symptoms of physical distress.   Average METs 2.39     Resistance Training   Training Prescription Yes   Weight 2 lbs   Reps 10-15     Interval Training   Interval Training No     Treadmill   MPH 1.8   Grade 0   Minutes 15   METs 2.32     NuStep   Level 1   SPM 80   Minutes 15   METs 2.4      Nutrition:  Target Goals: Understanding of nutrition guidelines, daily intake of sodium '1500mg'$ , cholesterol '200mg'$ , calories 30% from fat and 7% or less from saturated fats, daily to have 5 or more servings of fruits and vegetables.  Biometrics:     Pre Biometrics - 03/14/17 1518      Pre Biometrics   Height 5' 8.5" (1.74 m)   Weight 257 lb 3.2 oz (116.7 kg)   Waist Circumference 48 inches   Hip Circumference 50 inches   Waist to Hip Ratio 0.96 %   BMI (Calculated) 38.6   Single Leg Stand 3.43 seconds       Nutrition Therapy Plan and Nutrition Goals:     Nutrition Therapy & Goals - 03/14/17 1351      Intervention Plan   Intervention Prescribe, educate and counsel regarding individualized specific dietary modifications aiming towards targeted core components such as weight, hypertension, lipid management, diabetes, heart failure and other comorbidities.   Expected Outcomes Short Term Goal: Understand basic principles of dietary content, such as calories, fat, sodium, cholesterol and  nutrients.;Short Term Goal: A plan has been developed with personal nutrition goals set during dietitian appointment.;Long Term Goal: Adherence to prescribed nutrition plan.      Nutrition Discharge: Rate Your  Plate Scores:     Nutrition Assessments - 03/14/17 1401      MEDFICTS Scores   Pre Score 9      Nutrition Goals Re-Evaluation:   Nutrition Goals Discharge (Final Nutrition Goals Re-Evaluation):   Psychosocial: Target Goals: Acknowledge presence or absence of significant depression and/or stress, maximize coping skills, provide positive support system. Participant is able to verbalize types and ability to use techniques and skills needed for reducing stress and depression.   Initial Review & Psychosocial Screening:     Initial Psych Review & Screening - 03/14/17 1355      Initial Review   Current issues with Current Sleep Concerns  New not getting to sleep and able to stay asleep     [place]? Yes  Husband and adult children     Barriers   Psychosocial barriers to participate in program There are no identifiable barriers or psychosocial needs.;The patient should benefit from training in stress management and relaxation.     Screening Interventions   Interventions Encouraged to exercise      Quality of Life Scores:      Quality of Life - 03/14/17 1356      Quality of Life Scores   Health/Function Pre 17.43 %   Socioeconomic Pre 23.71 %   Psych/Spiritual Pre 26 %   Family Pre 25.2 %   GLOBAL Pre 21.63 %      PHQ-9: Recent Review Flowsheet Data    Depression screen [facility] 2/9 03/14/2017   Decreased Interest 0   Down, Depressed, Hopeless 0   PHQ - 2 Score 0   Altered sleeping 2    Tired, decreased energy 3    Change in appetite 0   Feeling bad or failure about yourself  0   Trouble concentrating 0   Moving slowly or fidgety/restless 0   Suicidal thoughts 0   PHQ-9 Score 5   Difficult doing work/chores Somewhat difficult      Interpretation of Total Score  Total Score Depression Severity:  1-4 = Minimal depression, 5-9 = Mild depression, 10-14 = Moderate depression, 15-19 = Moderately severe depression, 20-27 = Severe depression   Psychosocial Evaluation and Intervention:     Psychosocial Evaluation - 03/21/17 0946      Psychosocial Evaluation & Interventions   Comments Counselor met with Jakelin today for initial psychosocial evaluation.  She is a 56 year old who had triple by-pass 5 years ago and a stent inserted one month ago.  She has a strong support system with a spouse of 22 years and a daughter who lives close by.  Alzena has multiple health issues with diabetes; poor kidney function; vision issues and anemia.  She sleeps well and has a good appetite.  Sitara denies a history of depression or anxiety, but has reported several incidents over the past two years that resembled a panic attack and states that she sometimes has difficulty falling asleep due to worrying about her grandkids. She is typically in a positive mood and reports minimal stress; other than worrying about her grandchildren and not being able to drive because of her vision.  Nazly has goals to increase her stamina and strength and just "get more energy" while in this program.  She plans to continue exercising by using her treadmill and bike at home.  Staff will continue to follow with Libi throughout the course of this program.     Expected Outcomes Kashara will exercise consistently and increase  her energy.  She will also participate in the psychoeducational components of this program in order to learn how to relax and manage stress and anxiety better in her life.        Psychosocial Re-Evaluation:   Psychosocial Discharge (Final Psychosocial Re-Evaluation):   Vocational Rehabilitation: Provide vocational rehab assistance to qualifying candidates.   Vocational Rehab Evaluation & Intervention:     Vocational Rehab - 03/14/17 1401       Initial Vocational Rehab Evaluation & Intervention   Assessment shows need for Vocational Rehabilitation No      Education: Education Goals: Education classes will be provided on a weekly basis, covering required topics. Participant will state understanding/return demonstration of topics presented.  Learning Barriers/Preferences:     Learning Barriers/Preferences - 03/14/17 1359      Learning Barriers/Preferences   Learning Barriers Sight  unable to see secondary to eye disease   Learning Preferences Audio;Verbal Instruction      Education Topics: General Nutrition Guidelines/Fats and Fiber: -Group instruction provided by verbal, written material, models and posters to present the general guidelines for heart healthy nutrition. Gives an explanation and review of dietary fats and fiber.   Controlling Sodium/Reading Food Labels: -Group verbal and written material supporting the discussion of sodium use in heart healthy nutrition. Review and explanation with models, verbal and written materials for utilization of the food label.   Exercise Physiology & Risk Factors: - Group verbal and written instruction with models to review the exercise physiology of the cardiovascular system and associated critical values. Details cardiovascular disease risk factors and the goals associated with each risk factor.   Aerobic Exercise & Resistance Training: - Gives group verbal and written discussion on the health impact of inactivity. On the components of aerobic and resistive training programs and the benefits of this training and how to safely progress through these programs.   Flexibility, Balance, General Exercise Guidelines: - Provides group verbal and written instruction on the benefits of flexibility and balance training programs. Provides general exercise guidelines with specific guidelines to those with heart or lung disease. Demonstration and skill practice provided.   Stress  Management: - Provides group verbal and written instruction about the health risks of elevated stress, cause of high stress, and healthy ways to reduce stress.   Cardiac Rehab from 03/23/2017 in [facility] Cardiac and Pulmonary Rehab  Date  03/23/17  Educator  [facility]  Instruction Review Code  2- meets goals/outcomes      Depression: - Provides group verbal and written instruction on the correlation between heart/lung disease and depressed mood, treatment options, and the stigmas associated with seeking treatment.   Anatomy & Physiology of the Heart: - Group verbal and written instruction and models provide basic cardiac anatomy and physiology, with the coronary electrical and arterial systems. Review of: AMI, Angina, Valve disease, Heart Failure, Cardiac Arrhythmia, Pacemakers, and the ICD.   Cardiac Rehab from 03/23/2017 in [facility] Cardiac and Pulmonary Rehab  Date  03/21/17  Educator  CE  Instruction Review Code  2- meets goals/outcomes      Cardiac Procedures: - Group verbal and written instruction and models to describe the testing methods done to diagnose heart disease. Reviews the outcomes of the test results. Describes the treatment choices: Medical Management, Angioplasty, or Coronary Bypass Surgery.   Cardiac Medications: - Group verbal and written instruction to review commonly prescribed medications for heart disease. Reviews the medication, class of the drug, and side effects. Includes the steps to properly store meds  and maintain the prescription regimen.   Go Sex-Intimacy & Heart Disease, Get SMART - Goal Setting: - Group verbal and written instruction through game format to discuss heart disease and the return to sexual intimacy. Provides group verbal and written material to discuss and apply goal setting through the application of the S.M.A.R.T. Method.   Other Matters of the Heart: - Provides group verbal, written materials and models to describe Heart Failure, Angina, Valve  Disease, and Diabetes in the realm of heart disease. Includes description of the disease process and treatment options available to the cardiac patient.   Cardiac Rehab from 03/23/2017 in [facility] Cardiac and Pulmonary Rehab  Date  03/21/17  Educator  CE  Instruction Review Code  2- meets goals/outcomes      Exercise & Equipment Safety: - Individual verbal instruction and demonstration of equipment use and safety with use of the equipment.   Cardiac Rehab from 03/23/2017 in [facility] Cardiac and Pulmonary Rehab  Date  03/14/17  Educator  Sb  Instruction Review Code  2- meets goals/outcomes      Infection Prevention: - Provides verbal and written material to individual with discussion of infection control including proper hand washing and proper equipment cleaning during exercise session.   Cardiac Rehab from 03/23/2017 in [facility] Cardiac and Pulmonary Rehab  Date  03/14/17  Educator  SB  Instruction Review Code  2- meets goals/outcomes      Falls Prevention: - Provides verbal and written material to individual with discussion of falls prevention and safety.   Cardiac Rehab from 03/23/2017 in [facility] Cardiac and Pulmonary Rehab  Date  03/14/17  Educator  SB  Instruction Review Code  2- meets goals/outcomes      Diabetes: - Individual verbal and written instruction to review signs/symptoms of diabetes, desired ranges of glucose level fasting, after meals and with exercise. Advice that pre and post exercise glucose checks will be done for 3 sessions at entry of program.   Cardiac Rehab from 03/23/2017 in [place] Cardiac and Pulmonary Rehab  Date  03/14/17  Educator  SB  Instruction Review Code  2- meets goals/outcomes       Knowledge Questionnaire Score:     Knowledge Questionnaire Score - 03/14/17 1400      Knowledge Questionnaire Score   Pre Score 26/28  Reviewed correct responses with return vebalization of understanding.  Explained all topics will be part of the education classes       Core Components/Risk Factors/Patient Goals at Admission:     Personal Goals and Risk Factors at Admission - 03/14/17 1404      Core Components/Risk Factors/Patient Goals on Admission    Weight Management Yes;Weight Loss;Obesity   Intervention Weight Management: Develop a combined nutrition and exercise program designed to reach desired caloric intake, while maintaining appropriate intake of nutrient and fiber, sodium and fats, and appropriate energy expenditure required for the weight goal.;Weight Management: Provide education and appropriate resources to help participant work on and attain dietary goals.;Weight Management/Obesity: Establish reasonable short term and long term weight goals.   Admit Weight 257 lb 3.2 oz (116.7 kg)   Goal Weight: Short Term 250 lb (113.4 kg)   Goal Weight: Long Term 190 lb (86.2 kg)   Expected Outcomes Short Term: Continue to assess and modify interventions until short term weight is achieved;Long Term: Adherence to nutrition and physical activity/exercise program aimed toward attainment of established weight goal;Weight Loss: Understanding of general recommendations for a balanced deficit meal plan, which promotes 1-2  lb weight loss per week and includes a negative energy balance of [phone] kcal/d;Understanding recommendations for meals to include 15-35% energy as protein, 25-35% energy from fat, 35-60% energy from carbohydrates, less than '200mg'$  of dietary cholesterol, 20-35 gm of total fiber daily;Understanding of distribution of calorie intake throughout the day with the consumption of 4-5 meals/snacks   Diabetes Yes  HgbA1C around 7% presently   Intervention Provide education about signs/symptoms and action to take for hypo/hyperglycemia.;Provide education about proper nutrition, including hydration, and aerobic/resistive exercise prescription along with prescribed medications to achieve blood glucose in normal ranges: Fasting glucose 65-99 mg/dL   Expected  Outcomes Short Term: Participant verbalizes understanding of the signs/symptoms and immediate care of hyper/hypoglycemia, proper foot care and importance of medication, aerobic/resistive exercise and nutrition plan for blood glucose control.;Long Term: Attainment of HbA1C < 7%.   Hypertension Yes   Intervention Provide education on lifestyle modifcations including regular physical activity/exercise, weight management, moderate sodium restriction and increased consumption of fresh fruit, vegetables, and low fat dairy, alcohol moderation, and smoking cessation.;Monitor prescription use compliance.   Expected Outcomes Short Term: Continued assessment and intervention until BP is < 140/23m HG in hypertensive participants. < 130/841mHG in hypertensive participants with diabetes, heart failure or chronic kidney disease.;Long Term: Maintenance of blood pressure at goal levels.   Lipids Yes   Intervention Provide education and support for participant on nutrition & aerobic/resistive exercise along with prescribed medications to achieve LDL '70mg'$ , HDL >'40mg'$ .   Expected Outcomes Short Term: Participant states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

## 2017-05-18 ENCOUNTER — Telehealth: Payer: Self-pay | Admitting: *Deleted

## 2017-05-18 ENCOUNTER — Encounter: Payer: Self-pay | Admitting: *Deleted

## 2017-05-18 NOTE — Telephone Encounter (Signed)
Called to check on status of return for [facility]. During absence, doctor has heard possible leaky valve and pt is scheduled to have echo on Monday.  She will know more after that appointment about returning to rehab.

## 2017-05-20 ENCOUNTER — Encounter: Payer: 59 | Attending: Cardiology

## 2017-05-20 DIAGNOSIS — E113513 Type 2 diabetes mellitus with proliferative diabetic retinopathy with macular edema, bilateral: Secondary | ICD-10-CM | POA: Diagnosis not present

## 2017-05-20 DIAGNOSIS — I10 Essential (primary) hypertension: Secondary | ICD-10-CM | POA: Diagnosis not present

## 2017-05-20 DIAGNOSIS — Z955 Presence of coronary angioplasty implant and graft: Secondary | ICD-10-CM | POA: Insufficient documentation

## 2017-05-20 DIAGNOSIS — I25810 Atherosclerosis of coronary artery bypass graft(s) without angina pectoris: Secondary | ICD-10-CM | POA: Diagnosis not present

## 2017-05-20 DIAGNOSIS — H35373 Puckering of macula, bilateral: Secondary | ICD-10-CM | POA: Diagnosis not present

## 2017-05-20 DIAGNOSIS — Z48812 Encounter for surgical aftercare following surgery on the circulatory system: Secondary | ICD-10-CM | POA: Insufficient documentation

## 2017-05-30 DIAGNOSIS — Z794 Long term (current) use of insulin: Secondary | ICD-10-CM | POA: Diagnosis not present

## 2017-05-30 DIAGNOSIS — E113513 Type 2 diabetes mellitus with proliferative diabetic retinopathy with macular edema, bilateral: Secondary | ICD-10-CM | POA: Diagnosis not present

## 2017-06-01 ENCOUNTER — Encounter: Payer: Self-pay | Admitting: *Deleted

## 2017-06-01 DIAGNOSIS — Z955 Presence of coronary angioplasty implant and graft: Secondary | ICD-10-CM

## 2017-06-01 DIAGNOSIS — R011 Cardiac murmur, unspecified: Secondary | ICD-10-CM | POA: Diagnosis not present

## 2017-06-01 DIAGNOSIS — I214 Non-ST elevation (NSTEMI) myocardial infarction: Secondary | ICD-10-CM | POA: Diagnosis not present

## 2017-06-06 DIAGNOSIS — H2512 Age-related nuclear cataract, left eye: Secondary | ICD-10-CM | POA: Diagnosis not present

## 2017-06-06 DIAGNOSIS — H25042 Posterior subcapsular polar age-related cataract, left eye: Secondary | ICD-10-CM | POA: Diagnosis not present

## 2017-06-06 DIAGNOSIS — H25012 Cortical age-related cataract, left eye: Secondary | ICD-10-CM | POA: Diagnosis not present

## 2017-06-06 DIAGNOSIS — Z961 Presence of intraocular lens: Secondary | ICD-10-CM | POA: Insufficient documentation

## 2017-06-08 ENCOUNTER — Encounter: Payer: Self-pay | Admitting: *Deleted

## 2017-06-08 DIAGNOSIS — Z955 Presence of coronary angioplasty implant and graft: Secondary | ICD-10-CM

## 2017-06-08 NOTE — Progress Notes (Signed)
Cardiac Individual Treatment Plan  Patient Details  Name: Sharon Arias MRN: [id number] Date of Birth: Nov 04, 1961 Referring Provider:     Cardiac Rehab from 03/14/2017 in [facility]iac [facility] Re[facility]  Referring Provider  [name] MD      Initial Encounter Date:    Cardiac Rehab from 03/14/2017 in [facility] Cardiac and Pulmonary Rehab  Date  03/14/17  Referring Provider  [name] MD      Visit Diagnosis: Status post coronary artery stent placement  Patient's Home Medications on Admission:  Current Outpatient Prescriptions:  .  allopurinol (ZYLOPRIM) 100 MG tablet, Take by mouth., Disp: , Rfl:  .  amLODipine (NORVASC) 10 MG tablet, , Disp: , Rfl:  .  aspirin EC 81 MG tablet, Take by mouth., Disp: , Rfl:  .  atorvastatin (LIPITOR) 40 MG tablet, Take by mouth., Disp: , Rfl:  .  furosemide (LASIX) 40 MG tablet, Take 1 tablet by mouth  daily, Disp: , Rfl:  .  Insulin Glargine (LANTUS SOLOSTAR) 100 UNIT/ML Solostar Pen, Inject subcutaneously 74  units at bedtime ( Split  into 2 shots of 37 units  each at 2 different sites ), Disp: , Rfl:  .  insulin lispro (HUMALOG KWIKPEN) 100 UNIT/ML KiwkPen, Inject subcutaneously 22 units with breakfast,24 units with lunch,and 32 units with dinner, plus sliding scale as directed., Disp: , Rfl:  .  losartan (COZAAR) 100 MG tablet, , Disp: , Rfl:  .  metoprolol (LOPRESSOR) 50 MG tablet, Take 1 tablet by mouth two  times daily, Disp: , Rfl:  .  ticagrelor (BRILINTA) 90 MG TABS tablet, Take by mouth., Disp: , Rfl:   Past Medical History: Past Medical History:  Diagnosis Date  . Anemia in chronic kidney disease 05/19/2016  . CAD (coronary artery disease)   . Chicken pox   . Chronic kidney disease    per dr sparks  . CVA (cerebral vascular accident) ([place])   . Detached retina   . Diabetes mellitus without complication ([place])    type 2  . Diabetic neuropathy ([place])   . Diabetic retinopathy ([place])    legally blind  . Hyperlipidemia    . Hypertension   . Myocardial infarction   . Obesity   . PONV (postoperative nausea and vomiting)   . Sciatica of right side    going to see physiciatry    Tobacco Use: History  Smoking Status  . Never Smoker  Smokeless Tobacco  . Never Used    Labs: Recent Review Flowsheet Data    There is no flowsheet data to display.       Exercise Target Goals:    Exercise Program Goal: Individual exercise prescription set with THRR, safety & activity barriers. Participant demonstrates ability to understand and report RPE using BORG scale, to self-measure pulse accurately, and to acknowledge the importance of the exercise prescription.  Exercise Prescription Goal: Starting with aerobic activity 30 plus minutes a day, 3 days per week for initial exercise prescription. Provide home exercise prescription and guidelines that participant acknowledges understanding prior to discharge.  Activity Barriers & Risk Stratification:     Activity Barriers & Cardiac Risk Stratification - 03/14/17 1405      Activity Barriers & Cardiac Risk Stratification   Activity Barriers Back Problems;Other (comment);Balance Concerns;Muscular Weakness;Deconditioning;Shortness of Breath   Comments GOUT, Vision Loss, painful to walk   Cardiac Risk Stratification High      6 Minute Walk:     6 Minute Walk  [place] Name 03/14/17 1514         6 Minute Walk   Phase Initial     Distance 975 feet     Walk Time 4.77 minutes     # of Rest Breaks 1  rested 1:14      MPH 2.32     METS 2.35     RPE 15     Perceived Dyspnea  3     VO2 Peak 8.21     Symptoms Yes (comment)     Comments dizzy and lightheaded, SOB     Resting HR 62 bpm     Resting BP 128/64     Max Ex. HR 93 bpm     Max Ex. BP 126/54     2 Minute Post BP 116/60        Oxygen Initial Assessment:   Oxygen Re-Evaluation:   Oxygen Discharge (Final Oxygen Re-Evaluation):   Initial Exercise Prescription:     Initial Exercise  Prescription - 03/14/17 1500      Date of Initial Exercise RX and Referring Provider   Date 03/14/17   Referring Provider [name] MD     Treadmill   MPH 1.8   Grade 0   Minutes 15   METs 2.32     Recumbant Bike   Level 1   RPM 50   Minutes 15   METs 2     NuStep   Level 1   SPM 80   Minutes 15   METs 2     Prescription Details   Frequency (times per week) 3   Duration Progress to 45 minutes of aerobic exercise without signs/symptoms of physical distress     Intensity   THRR 40-80% of Max Heartrate 103-144   Ratings of Perceived Exertion 11-13   Perceived Dyspnea 0-4     Progression   Progression Continue to progress workloads to maintain intensity without signs/symptoms of physical distress.     Resistance Training   Training Prescription Yes   Weight 2 lbs   Reps 10-15      Perform Capillary Blood Glucose checks as needed.  Exercise Prescription Changes:     Exercise Prescription Changes    Row Name 03/14/17 1500 03/22/17 1000 04/07/17 1100         Response to Exercise   Blood Pressure (Admit) 128/64 132/70 144/78     Blood Pressure (Exercise) 126/54 148/80 136/64     Blood Pressure (Exit) 116/60 126/60 132/78     Heart Rate (Admit) 62 bpm 69 bpm 74 bpm     Heart Rate (Exercise) 93 bpm 101 bpm 75 bpm     Heart Rate (Exit) 67 bpm 67 bpm 67 bpm     Oxygen Saturation (Admit) 100 %  -  -     Oxygen Saturation (Exercise) 99 %  -  -     Rating of Perceived Exertion (Exercise) _0 Perceived Dyspnea (Exercise) 3  -  -     Symptoms dizzy, lightheaded, SON none none     Comments walk test results  - has been out since 4/4 with gout     Duration  - Progress to 45 minutes of aerobic exercise without signs/symptoms of physical distress Progress to 45 minutes of aerobic exercise without signs/symptoms of physical distress     Intensity  - THRR unchanged THRR unchanged       Progression   Progression  - Continue to  progress workloads to  maintain intensity without signs/symptoms of physical distress. Continue to progress workloads to maintain intensity without signs/symptoms of physical distress.     Average METs  - 2.26 2.39       Resistance Training   Training Prescription  - Yes Yes     Weight  - 2 lbs 2 lbs     Reps  - 10-15 10-15       Interval Training   Interval Training  - No No       Treadmill   MPH  - 1.8 1.8     Grade  - 0 0     Minutes  - 15 15     METs  - 2.32 2.32       Recumbant Bike   Level  - 1  -     RPM  - 50  -     Minutes  - 15  -     METs  - 1.8  -       NuStep   Level  - 1 1     SPM  - 80 80     Minutes  - 15 15     METs  - 2.6 2.4        Exercise Comments:     Exercise Comments    Row Name 03/16/17 0914 03/23/17 3500 04/21/17 1129       Exercise Comments First full day of exercise!  Patient was oriented to gym and equipment including functions, settings, policies, and procedures.  Patient's individual exercise prescription and treatment plan were reviewed.  All starting workloads were established based on the results of the 6 minute walk test done at initial orientation visit.  The plan for exercise progression was also introduced and progression will be customized based on patient's performance and goals. Sharon Arias has back pain - has had testing for disc problems etc - no issues.  I recommended the back pain class at the [facility] and spoke with her about how to find a good massage therapist and gave the number for [facility]. Sharon Arias is out on medical leave.  She had cataract surgery on 4/30.  She will return once cleared to exercise.  Last visit was 03/23/17.        Exercise Goals and Review:     Exercise Goals    Row Name 03/14/17 1518             Exercise Goals   Increase Physical Activity Yes  get into exercise routine       Intervention Provide advice, education, support and counseling about physical activity/exercise needs.;Develop an individualized exercise  prescription for aerobic and resistive training based on initial evaluation findings, risk stratification, comorbidities and participant's personal goals.       Expected Outcomes Achievement of increased cardiorespiratory fitness and enhanced flexibility, muscular endurance and strength shown through measurements of functional capacity and personal statement of participant.       Increase Strength and Stamina Yes  increase energy       Intervention Provide advice, education, support and counseling about physical activity/exercise needs.;Develop an individualized exercise prescription for aerobic and resistive training based on initial evaluation findings, risk stratification, comorbidities and participant's personal goals.       Expected Outcomes Achievement of increased cardiorespiratory fitness and enhanced flexibility, muscular endurance and strength shown through measurements of functional capacity and personal statement of participant.          Exercise  Goals Re-Evaluation :     Exercise Goals Re-Evaluation    Row Name 03/22/17 1036 04/07/17 1135 05/18/17 1430         Exercise Goal Re-Evaluation   Exercise Goals Review Increase Physical Activity;Increase Strenth and Stamina Increase Physical Activity;Increase Strenth and Stamina  -     Comments Sharon Arias is off to a good start with rehab.  She has been able to get around on her own and just needs help to make sure her weight and settings are correct.  She feels that she is getting a good workout during rehab.  We will continue to monitor her progression. Out since 4/4 with gout, no progression noted.  We will continue to monitor Out since 4/4     Expected Outcomes Short: Sharon Arias will start to increase some workloads.  Long: Continue to come to classes to work on strength and stamina. Short and Long: Able to return to rehab with improved attendance to work on strength and stamina.  -        Discharge Exercise Prescription (Final Exercise  Prescription Changes):     Exercise Prescription Changes - 04/07/17 1100      Response to Exercise   Blood Pressure (Admit) 144/78   Blood Pressure (Exercise) 136/64   Blood Pressure (Exit) 132/78   Heart Rate (Admit) 74 bpm   Heart Rate (Exercise) 75 bpm   Heart Rate (Exit) 67 bpm   Rating of Perceived Exertion (Exercise) 15   Symptoms none   Comments has been out since 4/4 with gout   Duration Progress to 45 minutes of aerobic exercise without signs/symptoms of physical distress   Intensity THRR unchanged     Progression   Progression Continue to progress workloads to maintain intensity without signs/symptoms of physical distress.   Average METs 2.39     Resistance Training   Training Prescription Yes   Weight 2 lbs   Reps 10-15     Interval Training   Interval Training No     Treadmill   MPH 1.8   Grade 0   Minutes 15   METs 2.32     NuStep   Level 1   SPM 80   Minutes 15   METs 2.4      Nutrition:  Target Goals: Understanding of nutrition guidelines, daily intake of sodium '1500mg'$ , cholesterol '200mg'$ , calories 30% from fat and 7% or less from saturated fats, daily to have 5 or more servings of fruits and vegetables.  Biometrics:     Pre Biometrics - 03/14/17 1518      Pre Biometrics   Height 5' 8.5" (1.74 m)   Weight 257 lb 3.2 oz (116.7 kg)   Waist Circumference 48 inches   Hip Circumference 50 inches   Waist to Hip Ratio 0.96 %   BMI (Calculated) 38.6   Single Leg Stand 3.43 seconds       Nutrition Therapy Plan and Nutrition Goals:     Nutrition Therapy & Goals - 03/14/17 1351      Intervention Plan   Intervention Prescribe, educate and counsel regarding individualized specific dietary modifications aiming towards targeted core components such as weight, hypertension, lipid management, diabetes, heart failure and other comorbidities.   Expected Outcomes Short Term Goal: Understand basic principles of dietary content, such as calories, fat,  sodium, cholesterol and nutrients.;Short Term Goal: A plan has been developed with personal nutrition goals set during dietitian appointment.;Long Term Goal: Adherence to prescribed nutrition plan.  Nutrition Discharge: Rate Your Plate Scores:     Nutrition Assessments - 03/14/17 1401      MEDFICTS Scores   Pre Score 9      Nutrition Goals Re-Evaluation:   Nutrition Goals Discharge (Final Nutrition Goals Re-Evaluation):   Psychosocial: Target Goals: Acknowledge presence or absence of significant depression and/or stress, maximize coping skills, provide positive support system. Participant is able to verbalize types and ability to use techniques and skills needed for reducing stress and depression.   Initial Review & Psychosocial Screening:     Initial Psych Review & Screening - 03/14/17 1355      Initial Review   Current issues with Current Sleep Concerns  New not getting to sleep and able to stay asleep     [place]? Yes  Husband and adult children     Barriers   Psychosocial barriers to participate in program There are no identifiable barriers or psychosocial needs.;The patient should benefit from training in stress management and relaxation.     Screening Interventions   Interventions Encouraged to exercise      Quality of Life Scores:      Quality of Life - 03/14/17 1356      Quality of Life Scores   Health/Function Pre 17.43 %   Socioeconomic Pre 23.71 %   Psych/Spiritual Pre 26 %   Family Pre 25.2 %   GLOBAL Pre 21.63 %      PHQ-9: Recent Review Flowsheet Data    Depression screen [facility] 2/9 03/14/2017   Decreased Interest 0   Down, Depressed, Hopeless 0   PHQ - 2 Score 0   Altered sleeping 2    Tired, decreased energy 3    Change in appetite 0   Feeling bad or failure about yourself  0   Trouble concentrating 0   Moving slowly or fidgety/restless 0   Suicidal thoughts 0   PHQ-9 Score 5   Difficult doing  work/chores Somewhat difficult     Interpretation of Total Score  Total Score Depression Severity:  1-4 = Minimal depression, 5-9 = Mild depression, 10-14 = Moderate depression, 15-19 = Moderately severe depression, 20-27 = Severe depression   Psychosocial Evaluation and Intervention:     Psychosocial Evaluation - 03/21/17 0946      Psychosocial Evaluation & Interventions   Comments Counselor met with Sharon Arias today for initial psychosocial evaluation.  She is a 56 year old who had triple by-pass 5 years ago and a stent inserted one month ago.  She has a strong support system with a spouse of 67 years and a daughter who lives close by.  Sharon Arias has multiple health issues with diabetes; poor kidney function; vision issues and anemia.  She sleeps well and has a good appetite.  Sharon Arias denies a history of depression or anxiety, but has reported several incidents over the past two years that resembled a panic attack and states that she sometimes has difficulty falling asleep due to worrying about her grandkids. She is typically in a positive mood and reports minimal stress; other than worrying about her grandchildren and not being able to drive because of her vision.  Sharon Arias has goals to increase her stamina and strength and just "get more energy" while in this program.  She plans to continue exercising by using her treadmill and bike at home.  Staff will continue to follow with Sharon Arias throughout the course of this program.     Expected Outcomes Sharon Arias will  exercise consistently and increase her energy.  She will also participate in the psychoeducational components of this program in order to learn how to relax and manage stress and anxiety better in her life.        Psychosocial Re-Evaluation:   Psychosocial Discharge (Final Psychosocial Re-Evaluation):   Vocational Rehabilitation: Provide vocational rehab assistance to qualifying candidates.   Vocational Rehab Evaluation & Intervention:      Vocational Rehab - 03/14/17 1401      Initial Vocational Rehab Evaluation & Intervention   Assessment shows need for Vocational Rehabilitation No      Education: Education Goals: Education classes will be provided on a weekly basis, covering required topics. Participant will state understanding/return demonstration of topics presented.  Learning Barriers/Preferences:     Learning Barriers/Preferences - 03/14/17 1359      Learning Barriers/Preferences   Learning Barriers Sight  unable to see secondary to eye disease   Learning Preferences Audio;Verbal Instruction      Education Topics: General Nutrition Guidelines/Fats and Fiber: -Group instruction provided by verbal, written material, models and posters to present the general guidelines for heart healthy nutrition. Gives an explanation and review of dietary fats and fiber.   Controlling Sodium/Reading Food Labels: -Group verbal and written material supporting the discussion of sodium use in heart healthy nutrition. Review and explanation with models, verbal and written materials for utilization of the food label.   Exercise Physiology & Risk Factors: - Group verbal and written instruction with models to review the exercise physiology of the cardiovascular system and associated critical values. Details cardiovascular disease risk factors and the goals associated with each risk factor.   Aerobic Exercise & Resistance Training: - Gives group verbal and written discussion on the health impact of inactivity. On the components of aerobic and resistive training programs and the benefits of this training and how to safely progress through these programs.   Flexibility, Balance, General Exercise Guidelines: - Provides group verbal and written instruction on the benefits of flexibility and balance training programs. Provides general exercise guidelines with specific guidelines to those with heart or lung disease. Demonstration and  skill practice provided.   Stress Management: - Provides group verbal and written instruction about the health risks of elevated stress, cause of high stress, and healthy ways to reduce stress.   Cardiac Rehab from 03/23/2017 in [facility] Cardiac and Pulmonary Rehab  Date  03/23/17  Educator  [facility]  Instruction Review Code  2- meets goals/outcomes      Depression: - Provides group verbal and written instruction on the correlation between heart/lung disease and depressed mood, treatment options, and the stigmas associated with seeking treatment.   Anatomy & Physiology of the Heart: - Group verbal and written instruction and models provide basic cardiac anatomy and physiology, with the coronary electrical and arterial systems. Review of: AMI, Angina, Valve disease, Heart Failure, Cardiac Arrhythmia, Pacemakers, and the ICD.   Cardiac Rehab from 03/23/2017 in [facility] Cardiac and Pulmonary Rehab  Date  03/21/17  Educator  CE  Instruction Review Code  2- meets goals/outcomes      Cardiac Procedures: - Group verbal and written instruction and models to describe the testing methods done to diagnose heart disease. Reviews the outcomes of the test results. Describes the treatment choices: Medical Management, Angioplasty, or Coronary Bypass Surgery.   Cardiac Medications: - Group verbal and written instruction to review commonly prescribed medications for heart disease. Reviews the medication, class of the drug, and side effects. Includes the steps  to properly store meds and maintain the prescription regimen.   Go Sex-Intimacy & Heart Disease, Get SMART - Goal Setting: - Group verbal and written instruction through game format to discuss heart disease and the return to sexual intimacy. Provides group verbal and written material to discuss and apply goal setting through the application of the S.M.A.R.T. Method.   Other Matters of the Heart: - Provides group verbal, written materials and models to  describe Heart Failure, Angina, Valve Disease, and Diabetes in the realm of heart disease. Includes description of the disease process and treatment options available to the cardiac patient.   Cardiac Rehab from 03/23/2017 in [facility] Cardiac and Pulmonary Rehab  Date  03/21/17  Educator  CE  Instruction Review Code  2- meets goals/outcomes      Exercise & Equipment Safety: - Individual verbal instruction and demonstration of equipment use and safety with use of the equipment.   Cardiac Rehab from 03/23/2017 in [facility] Cardiac [facility] Pulmonary Rehab  Date  03/14/17  Educator  Sb  Instruction Review Code  2- meets goals/outcomes      Infection Prevention: - Provides verbal and written material to individual with discussion of infection control including proper hand washing and proper equipment cleaning during exercise session.   Cardiac Rehab from 03/23/2017 in [facility] Cardiac and Pulmonary Rehab  Date  03/14/17  Educator  SB  Instruction Review Code  2- meets goals/outcomes      Falls Prevention: - Provides verbal and written material to individual with discussion of falls prevention and safety.   Cardiac Rehab from 03/23/2017 in [facility] Cardiac and Pulmonary Rehab  Date  03/14/17  Educator  SB  Instruction Review Code  2- meets goals/outcomes      Diabetes: - Individual verbal and written instruction to review signs/symptoms of diabetes, desired ranges of glucose level fasting, after meals and with exercise. Advice that pre and post exercise glucose checks will be done for 3 sessions at entry of program.   Cardiac Rehab from 03/23/2017 in [facility] Cardiac and Pulmonary Rehab  Date  03/14/17  Educator  SB  Instruction Review Code  2- meets goals/outcomes       Knowledge Questionnaire Score:     Knowledge Questionnaire Score - 03/14/17 1400      Knowledge Questionnaire Score   Pre Score 26/28  Reviewed correct responses with return vebalization of understanding.  Explained all topics will be  part of the education classes      Core Components/Risk Factors/Patient Goals at Admission:     Personal Goals and Risk Factors at Admission - 03/14/17 1404      Core Components/Risk Factors/Patient Goals on Admission    Weight Management Yes;Weight Loss;Obesity   Intervention Weight Management: Develop a combined nutrition and exercise program designed to reach desired caloric intake, while maintaining appropriate intake of nutrient and fiber, sodium and fats, and appropriate energy expenditure required for the weight goal.;Weight Management: Provide education and appropriate resources to help participant work on and attain dietary goals.;Weight Management/Obesity: Establish reasonable short term and long term weight goals.   Admit Weight 257 lb 3.2 oz (116.7 kg)   Goal Weight: Short Term 250 lb (113.4 kg)   Goal Weight: Long Term 190 lb (86.2 kg)   Expected Outcomes Short Term: Continue to assess and modify interventions until short term weight is achieved;Long Term: Adherence to nutrition and physical activity/exercise program aimed toward attainment of established weight goal;Weight Loss: Understanding of general recommendations for a balanced deficit meal  plan, which promotes 1-2 lb weight loss per week and includes a negative energy balance of [phone] kcal/d;Understanding recommendations for meals to include 15-35% energy as protein, 25-35% energy from fat, 35-60% energy from carbohydrates, less than '200mg'$  of dietary cholesterol, 20-35 gm of total fiber daily;Understanding of distribution of calorie intake throughout the day with the consumption of 4-5 meals/snacks   Diabetes Yes  HgbA1C around 7% presently   Intervention Provide education about signs/symptoms and action to take for hypo/hyperglycemia.;Provide education about proper nutrition, including hydration, and aerobic/resistive exercise prescription along with prescribed medications to achieve blood glucose in normal ranges: Fasting  glucose 65-99 mg/dL   Expected Outcomes Short Term: Participant verbalizes understanding of the signs/symptoms and immediate care of hyper/hypoglycemia, proper foot care and importance of medication, aerobic/resistive exercise and nutrition plan for blood glucose control.;Long Term: Attainment of HbA1C < 7%.   Hypertension Yes   Intervention Provide education on lifestyle modifcations including regular physical activity/exercise, weight management, moderate sodium restriction and increased consumption of fresh fruit, vegetables, and low fat dairy, alcohol moderation, and smoking cessation.;Monitor prescription use compliance.   Expected Outcomes Short Term: Continued assessment and intervention until BP is < 140/74m HG in hypertensive participants. < 130/829mHG in hypertensive participants with diabetes, heart failure or chronic kidney disease.;Long Term: Maintenance of blood pressure at goal levels.   Lipids Yes   Intervention Provide education and support for participant on nutrition & aerobic/resistive exercise along with prescribed medications to achieve LDL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

## 2017-06-10 DIAGNOSIS — I10 Essential (primary) hypertension: Secondary | ICD-10-CM | POA: Diagnosis not present

## 2017-06-10 DIAGNOSIS — I25810 Atherosclerosis of coronary artery bypass graft(s) without angina pectoris: Secondary | ICD-10-CM | POA: Diagnosis not present

## 2017-06-10 DIAGNOSIS — E1142 Type 2 diabetes mellitus with diabetic polyneuropathy: Secondary | ICD-10-CM | POA: Diagnosis not present

## 2017-06-13 ENCOUNTER — Encounter: Payer: Self-pay | Admitting: *Deleted

## 2017-06-13 ENCOUNTER — Telehealth: Payer: Self-pay | Admitting: *Deleted

## 2017-06-13 DIAGNOSIS — Z955 Presence of coronary angioplasty implant and graft: Secondary | ICD-10-CM

## 2017-06-13 NOTE — Telephone Encounter (Signed)
Keirstin has been through surgery for both eyes.  She has appointment on Friday for clearance from her eyes.  She continues to have a gout as well and has an appointment with a specialist next week.  She hopes to return soon.

## 2017-06-20 DIAGNOSIS — N184 Chronic kidney disease, stage 4 (severe): Secondary | ICD-10-CM | POA: Diagnosis not present

## 2017-06-20 DIAGNOSIS — E1142 Type 2 diabetes mellitus with diabetic polyneuropathy: Secondary | ICD-10-CM | POA: Diagnosis not present

## 2017-06-20 DIAGNOSIS — Z79899 Other long term (current) drug therapy: Secondary | ICD-10-CM | POA: Insufficient documentation

## 2017-06-20 DIAGNOSIS — M1A00X0 Idiopathic chronic gout, unspecified site, without tophus (tophi): Secondary | ICD-10-CM | POA: Diagnosis not present

## 2017-06-21 DIAGNOSIS — I10 Essential (primary) hypertension: Secondary | ICD-10-CM | POA: Diagnosis not present

## 2017-06-21 DIAGNOSIS — I214 Non-ST elevation (NSTEMI) myocardial infarction: Secondary | ICD-10-CM | POA: Diagnosis not present

## 2017-06-21 DIAGNOSIS — I25810 Atherosclerosis of coronary artery bypass graft(s) without angina pectoris: Secondary | ICD-10-CM | POA: Diagnosis not present

## 2017-06-23 DIAGNOSIS — G4733 Obstructive sleep apnea (adult) (pediatric): Secondary | ICD-10-CM | POA: Diagnosis not present

## 2017-06-24 ENCOUNTER — Ambulatory Visit: Payer: Medicare Other | Admitting: Dietician

## 2017-07-05 ENCOUNTER — Encounter: Payer: Self-pay | Admitting: Dietician

## 2017-07-05 NOTE — Progress Notes (Signed)
Have not heard back from patient to reschedule her appointment from 06/24/17, which was cancelled due to gout. Sent letter to MD.

## 2017-07-06 ENCOUNTER — Encounter: Payer: Self-pay | Admitting: *Deleted

## 2017-07-06 ENCOUNTER — Telehealth: Payer: Self-pay | Admitting: *Deleted

## 2017-07-06 DIAGNOSIS — Z955 Presence of coronary angioplasty implant and graft: Secondary | ICD-10-CM

## 2017-07-06 NOTE — Progress Notes (Signed)
Cardiac Individual Treatment Plan  Patient Details  Name: Sharon Arias MRN: [id number] Date of Birth: Nov 04, 1961 Referring Provider:     Cardiac Rehab from 03/14/2017 in [facility] Cardiac [facility] Re[facility]  Referring Provider  [name] MD      Initial Encounter Date:    Cardiac Rehab from 03/14/2017 in [facility] Cardiac and Pulmonary Rehab  Date  03/14/17  Referring Provider  [name] MD      Visit Diagnosis: Status post coronary artery stent placement  Patient's Home Medications on Admission:  Current Outpatient Prescriptions:  .  allopurinol (ZYLOPRIM) 100 MG tablet, Take by mouth., Disp: , Rfl:  .  amLODipine (NORVASC) 10 MG tablet, , Disp: , Rfl:  .  aspirin EC 81 MG tablet, Take by mouth., Disp: , Rfl:  .  atorvastatin (LIPITOR) 40 MG tablet, Take by mouth., Disp: , Rfl:  .  furosemide (LASIX) 40 MG tablet, Take 1 tablet by mouth  daily, Disp: , Rfl:  .  Insulin Glargine (LANTUS SOLOSTAR) 100 UNIT/ML Solostar Pen, Inject subcutaneously 74  units at bedtime ( Split  into 2 shots of 37 units  each at 2 different sites ), Disp: , Rfl:  .  insulin lispro (HUMALOG KWIKPEN) 100 UNIT/ML KiwkPen, Inject subcutaneously 22 units with breakfast,24 units with lunch,and 32 units with dinner, plus sliding scale as directed., Disp: , Rfl:  .  losartan (COZAAR) 100 MG tablet, , Disp: , Rfl:  .  metoprolol (LOPRESSOR) 50 MG tablet, Take 1 tablet by mouth two  times daily, Disp: , Rfl:  .  ticagrelor (BRILINTA) 90 MG TABS tablet, Take by mouth., Disp: , Rfl:   Past Medical History: Past Medical History:  Diagnosis Date  . Anemia in chronic kidney disease 05/19/2016  . CAD (coronary artery disease)   . Chicken pox   . Chronic kidney disease    per dr sparks  . CVA (cerebral vascular accident) ([place])   . Detached retina   . Diabetes mellitus without complication ([place])    type 2  . Diabetic neuropathy ([place])   . Diabetic retinopathy ([place])    legally blind  . Hyperlipidemia    . Hypertension   . Myocardial infarction   . Obesity   . PONV (postoperative nausea and vomiting)   . Sciatica of right side    going to see physiciatry    Tobacco Use: History  Smoking Status  . Never Smoker  Smokeless Tobacco  . Never Used    Labs: Recent Review Flowsheet Data    There is no flowsheet data to display.       Exercise Target Goals:    Exercise Program Goal: Individual exercise prescription set with THRR, safety & activity barriers. Participant demonstrates ability to understand and report RPE using BORG scale, to self-measure pulse accurately, and to acknowledge the importance of the exercise prescription.  Exercise Prescription Goal: Starting with aerobic activity 30 plus minutes a day, 3 days per week for initial exercise prescription. Provide home exercise prescription and guidelines that participant acknowledges understanding prior to discharge.  Activity Barriers & Risk Stratification:     Activity Barriers & Cardiac Risk Stratification - 03/14/17 1405      Activity Barriers & Cardiac Risk Stratification   Activity Barriers Back Problems;Other (comment);Balance Concerns;Muscular Weakness;Deconditioning;Shortness of Breath   Comments GOUT, Vision Loss, painful to walk   Cardiac Risk Stratification High      6 Minute Walk:     6 Minute Walk  [place] Name 03/14/17 1514         6 Minute Walk   Phase Initial     Distance 975 feet     Walk Time 4.77 minutes     # of Rest Breaks 1  rested 1:14      MPH 2.32     METS 2.35     RPE 15     Perceived Dyspnea  3     VO2 Peak 8.21     Symptoms Yes (comment)     Comments dizzy and lightheaded, SOB     Resting HR 62 bpm     Resting BP 128/64     Max Ex. HR 93 bpm     Max Ex. BP 126/54     2 Minute Post BP 116/60        Oxygen Initial Assessment:   Oxygen Re-Evaluation:   Oxygen Discharge (Final Oxygen Re-Evaluation):   Initial Exercise Prescription:     Initial Exercise  Prescription - 03/14/17 1500      Date of Initial Exercise RX and Referring Provider   Date 03/14/17   Referring Provider [name] MD     Treadmill   MPH 1.8   Grade 0   Minutes 15   METs 2.32     Recumbant Bike   Level 1   RPM 50   Minutes 15   METs 2     NuStep   Level 1   SPM 80   Minutes 15   METs 2     Prescription Details   Frequency (times per week) 3   Duration Progress to 45 minutes of aerobic exercise without signs/symptoms of physical distress     Intensity   THRR 40-80% of Max Heartrate 103-144   Ratings of Perceived Exertion 11-13   Perceived Dyspnea 0-4     Progression   Progression Continue to progress workloads to maintain intensity without signs/symptoms of physical distress.     Resistance Training   Training Prescription Yes   Weight 2 lbs   Reps 10-15      Perform Capillary Blood Glucose checks as needed.  Exercise Prescription Changes:     Exercise Prescription Changes    Row Name 03/14/17 1500 03/22/17 1000 04/07/17 1100         Response to Exercise   Blood Pressure (Admit) 128/64 132/70 144/78     Blood Pressure (Exercise) 126/54 148/80 136/64     Blood Pressure (Exit) 116/60 126/60 132/78     Heart Rate (Admit) 62 bpm 69 bpm 74 bpm     Heart Rate (Exercise) 93 bpm 101 bpm 75 bpm     Heart Rate (Exit) 67 bpm 67 bpm 67 bpm     Oxygen Saturation (Admit) 100 %  -  -     Oxygen Saturation (Exercise) 99 %  -  -     Rating of Perceived Exertion (Exercise) _0 Perceived Dyspnea (Exercise) 3  -  -     Symptoms dizzy, lightheaded, SON none none     Comments walk test results  - has been out since 4/4 with gout     Duration  - Progress to 45 minutes of aerobic exercise without signs/symptoms of physical distress Progress to 45 minutes of aerobic exercise without signs/symptoms of physical distress     Intensity  - THRR unchanged THRR unchanged       Progression   Progression  - Continue to  progress workloads to  maintain intensity without signs/symptoms of physical distress. Continue to progress workloads to maintain intensity without signs/symptoms of physical distress.     Average METs  - 2.26 2.39       Resistance Training   Training Prescription  - Yes Yes     Weight  - 2 lbs 2 lbs     Reps  - 10-15 10-15       Interval Training   Interval Training  - No No       Treadmill   MPH  - 1.8 1.8     Grade  - 0 0     Minutes  - 15 15     METs  - 2.32 2.32       Recumbant Bike   Level  - 1  -     RPM  - 50  -     Minutes  - 15  -     METs  - 1.8  -       NuStep   Level  - 1 1     SPM  - 80 80     Minutes  - 15 15     METs  - 2.6 2.4        Exercise Comments:     Exercise Comments    Row Name 03/16/17 0914 03/23/17 3500 04/21/17 1129       Exercise Comments First full day of exercise!  Patient was oriented to gym and equipment including functions, settings, policies, and procedures.  Patient's individual exercise prescription and treatment plan were reviewed.  All starting workloads were established based on the results of the 6 minute walk test done at initial orientation visit.  The plan for exercise progression was also introduced and progression will be customized based on patient's performance and goals. Gracey has back pain - has had testing for disc problems etc - no issues.  I recommended the back pain class at the [facility] and spoke with her about how to find a good massage therapist and gave the number for [facility]. Kayelee is out on medical leave.  She had cataract surgery on 4/30.  She will return once cleared to exercise.  Last visit was 03/23/17.        Exercise Goals and Review:     Exercise Goals    Row Name 03/14/17 1518             Exercise Goals   Increase Physical Activity Yes  get into exercise routine       Intervention Provide advice, education, support and counseling about physical activity/exercise needs.;Develop an individualized exercise  prescription for aerobic and resistive training based on initial evaluation findings, risk stratification, comorbidities and participant's personal goals.       Expected Outcomes Achievement of increased cardiorespiratory fitness and enhanced flexibility, muscular endurance and strength shown through measurements of functional capacity and personal statement of participant.       Increase Strength and Stamina Yes  increase energy       Intervention Provide advice, education, support and counseling about physical activity/exercise needs.;Develop an individualized exercise prescription for aerobic and resistive training based on initial evaluation findings, risk stratification, comorbidities and participant's personal goals.       Expected Outcomes Achievement of increased cardiorespiratory fitness and enhanced flexibility, muscular endurance and strength shown through measurements of functional capacity and personal statement of participant.          Exercise  Goals Re-Evaluation :     Exercise Goals Re-Evaluation    Row Name 03/22/17 1036 04/07/17 1135 05/18/17 1430 06/13/17 1609       Exercise Goal Re-Evaluation   Exercise Goals Review Increase Physical Activity;Increase Strenth and Stamina Increase Physical Activity;Increase Strenth and Stamina  -  -    Comments Aara is off to a good start with rehab.  She has been able to get around on her own and just needs help to make sure her weight and settings are correct.  She feels that she is getting a good workout during rehab.  We will continue to monitor her progression. Out since 4/4 with gout, no progression noted.  We will continue to monitor Out since 4/4 Out since 4/4    Expected Outcomes Short: [place] will start to increase some workloads.  Long: Continue to come to classes to work on strength and stamina. Short and Long: Able to return to rehab with improved attendance to work on strength and stamina.  -  -       Discharge Exercise  Prescription (Final Exercise Prescription Changes):     Exercise Prescription Changes - 04/07/17 1100      Response to Exercise   Blood Pressure (Admit) 144/78   Blood Pressure (Exercise) 136/64   Blood Pressure (Exit) 132/78   Heart Rate (Admit) 74 bpm   Heart Rate (Exercise) 75 bpm   Heart Rate (Exit) 67 bpm   Rating of Perceived Exertion (Exercise) 15   Symptoms none   Comments has been out since 4/4 with gout   Duration Progress to 45 minutes of aerobic exercise without signs/symptoms of physical distress   Intensity THRR unchanged     Progression   Progression Continue to progress workloads to maintain intensity without signs/symptoms of physical distress.   Average METs 2.39     Resistance Training   Training Prescription Yes   Weight 2 lbs   Reps 10-15     Interval Training   Interval Training No     Treadmill   MPH 1.8   Grade 0   Minutes 15   METs 2.32     NuStep   Level 1   SPM 80   Minutes 15   METs 2.4      Nutrition:  Target Goals: Understanding of nutrition guidelines, daily intake of sodium <1561m, cholesterol <2058m calories 30% from fat and 7% or less from saturated fats, daily to have 5 or more servings of fruits and vegetables.  Biometrics:     Pre Biometrics - 03/14/17 1518      Pre Biometrics   Height 5' 8.5" (1.74 m)   Weight 257 lb 3.2 oz (116.7 kg)   Waist Circumference 48 inches   Hip Circumference 50 inches   Waist to Hip Ratio 0.96 %   BMI (Calculated) 38.6   Single Leg Stand 3.43 seconds       Nutrition Therapy Plan and Nutrition Goals:     Nutrition Therapy & Goals - 03/14/17 1351      Intervention Plan   Intervention Prescribe, educate and counsel regarding individualized specific dietary modifications aiming towards targeted core components such as weight, hypertension, lipid management, diabetes, heart failure and other comorbidities.   Expected Outcomes Short Term Goal: Understand basic principles of dietary  content, such as calories, fat, sodium, cholesterol and nutrients.;Short Term Goal: A plan has been developed with personal nutrition goals set during dietitian appointment.;Long Term Goal: Adherence to prescribed nutrition plan.  Nutrition Discharge: Rate Your Plate Scores:     Nutrition Assessments - 03/14/17 1401      MEDFICTS Scores   Pre Score 9      Nutrition Goals Re-Evaluation:   Nutrition Goals Discharge (Final Nutrition Goals Re-Evaluation):   Psychosocial: Target Goals: Acknowledge presence or absence of significant depression and/or stress, maximize coping skills, provide positive support system. Participant is able to verbalize types and ability to use techniques and skills needed for reducing stress and depression.   Initial Review & Psychosocial Screening:     Initial Psych Review & Screening - 03/14/17 1355      Initial Review   Current issues with Current Sleep Concerns  New not getting to sleep and able to stay asleep     [place]? Yes  Husband and adult children     Barriers   Psychosocial barriers to participate in program There are no identifiable barriers or psychosocial needs.;The patient should benefit from training in stress management and relaxation.     Screening Interventions   Interventions Encouraged to exercise      Quality of Life Scores:      Quality of Life - 03/14/17 1356      Quality of Life Scores   Health/Function Pre 17.43 %   Socioeconomic Pre 23.71 %   Psych/Spiritual Pre 26 %   Family Pre 25.2 %   GLOBAL Pre 21.63 %      PHQ-9: Recent Review Flowsheet Data    Depression screen [facility] 2/9 03/14/2017   Decreased Interest 0   Down, Depressed, Hopeless 0   PHQ - 2 Score 0   Altered sleeping 2    Tired, decreased energy 3    Change in appetite 0   Feeling bad or failure about yourself  0   Trouble concentrating 0   Moving slowly or fidgety/restless 0   Suicidal thoughts 0   PHQ-9  Score 5   Difficult doing work/chores Somewhat difficult     Interpretation of Total Score  Total Score Depression Severity:  1-4 = Minimal depression, 5-9 = Mild depression, 10-14 = Moderate depression, 15-19 = Moderately severe depression, 20-27 = Severe depression   Psychosocial Evaluation and Intervention:     Psychosocial Evaluation - 03/21/17 0946      Psychosocial Evaluation & Interventions   Comments Counselor met with Becca today for initial psychosocial evaluation.  She is a 56 year old who had triple by-pass 5 years ago and a stent inserted one month ago.  She has a strong support system with a spouse of 27 years and a daughter who lives close by.  Joycelin has multiple health issues with diabetes; poor kidney function; vision issues and anemia.  She sleeps well and has a good appetite.  Ariyon denies a history of depression or anxiety, but has reported several incidents over the past two years that resembled a panic attack and states that she sometimes has difficulty falling asleep due to worrying about her grandkids. She is typically in a positive mood and reports minimal stress; other than worrying about her grandchildren and not being able to drive because of her vision.  Raianna has goals to increase her stamina and strength and just "get more energy" while in this program.  She plans to continue exercising by using her treadmill and bike at home.  Staff will continue to follow with Vickii throughout the course of this program.     Expected Outcomes [place] will  exercise consistently and increase her energy.  She will also participate in the psychoeducational components of this program in order to learn how to relax and manage stress and anxiety better in her life.        Psychosocial Re-Evaluation:   Psychosocial Discharge (Final Psychosocial Re-Evaluation):   Vocational Rehabilitation: Provide vocational rehab assistance to qualifying candidates.   Vocational Rehab  Evaluation & Intervention:     Vocational Rehab - 03/14/17 1401      Initial Vocational Rehab Evaluation & Intervention   Assessment shows need for Vocational Rehabilitation No      Education: Education Goals: Education classes will be provided on a weekly basis, covering required topics. Participant will state understanding/return demonstration of topics presented.  Learning Barriers/Preferences:     Learning Barriers/Preferences - 03/14/17 1359      Learning Barriers/Preferences   Learning Barriers Sight  unable to see secondary to eye disease   Learning Preferences Audio;Verbal Instruction      Education Topics: General Nutrition Guidelines/Fats and Fiber: -Group instruction provided by verbal, written material, models and posters to present the general guidelines for heart healthy nutrition. Gives an explanation and review of dietary fats and fiber.   Controlling Sodium/Reading Food Labels: -Group verbal and written material supporting the discussion of sodium use in heart healthy nutrition. Review and explanation with models, verbal and written materials for utilization of the food label.   Exercise Physiology & Risk Factors: - Group verbal and written instruction with models to review the exercise physiology of the cardiovascular system and associated critical values. Details cardiovascular disease risk factors and the goals associated with each risk factor.   Aerobic Exercise & Resistance Training: - Gives group verbal and written discussion on the health impact of inactivity. On the components of aerobic and resistive training programs and the benefits of this training and how to safely progress through these programs.   Flexibility, Balance, General Exercise Guidelines: - Provides group verbal and written instruction on the benefits of flexibility and balance training programs. Provides general exercise guidelines with specific guidelines to those with heart or  lung disease. Demonstration and skill practice provided.   Stress Management: - Provides group verbal and written instruction about the health risks of elevated stress, cause of high stress, and healthy ways to reduce stress.   Cardiac Rehab from 03/23/2017 in [facility] Cardiac and Pulmonary Rehab  Date  03/23/17  Educator  [facility]  Instruction Review Code  2- meets goals/outcomes      Depression: - Provides group verbal and written instruction on the correlation between heart/lung disease and depressed mood, treatment options, and the stigmas associated with seeking treatment.   Anatomy & Physiology of the Heart: - Group verbal and written instruction and models provide basic cardiac anatomy and physiology, with the coronary electrical and arterial systems. Review of: AMI, Angina, Valve disease, Heart Failure, Cardiac Arrhythmia, Pacemakers, and the ICD.   Cardiac Rehab from 03/23/2017 in [facility] Cardiac and Pulmonary Rehab  Date  03/21/17  Educator  CE  Instruction Review Code  2- meets goals/outcomes      Cardiac Procedures: - Group verbal and written instruction and models to describe the testing methods done to diagnose heart disease. Reviews the outcomes of the test results. Describes the treatment choices: Medical Management, Angioplasty, or Coronary Bypass Surgery.   Cardiac Medications: - Group verbal and written instruction to review commonly prescribed medications for heart disease. Reviews the medication, class of the drug, and side effects. Includes the steps  to properly store meds and maintain the prescription regimen.   Go Sex-Intimacy & Heart Disease, Get SMART - Goal Setting: - Group verbal and written instruction through game format to discuss heart disease and the return to sexual intimacy. Provides group verbal and written material to discuss and apply goal setting through the application of the S.M.A.R.T. Method.   Other Matters of the Heart: - Provides group verbal, written  materials and models to describe Heart Failure, Angina, Valve Disease, and Diabetes in the realm of heart disease. Includes description of the disease process and treatment options available to the cardiac patient.   Cardiac Rehab from 03/23/2017 in [facility] Cardiac and Pulmonary Rehab  Date  03/21/17  Educator  CE  Instruction Review Code  2- meets goals/outcomes      Exercise & Equipment Safety: - Individual verbal instruction and demonstration of equipment use and safety with use of the equipment.   Cardiac Rehab from 03/23/2017 in [facility] Cardiac and Pulmonary Rehab  Date  03/14/17  Educator  Sb  Instruction Review Code  2- meets goals/outcomes      Infection Prevention: - Provides verbal and written material to individual with discussion of infection control including proper hand washing and proper equipment cleaning during exercise session.   Cardiac Rehab from 03/23/2017 in [facility] Cardiac and Pulmonary Rehab  Date  03/14/17  Educator  SB  Instruction Review Code  2- meets goals/outcomes      Falls Prevention: - Provides verbal and written material to individual with discussion of falls prevention and safety.   Cardiac Rehab from 03/23/2017 in [facility] Cardiac and Pulmonary Rehab  Date  03/14/17  Educator  SB  Instruction Review Code  2- meets goals/outcomes      Diabetes: - Individual verbal and written instruction to review signs/symptoms of diabetes, desired ranges of glucose level fasting, after meals and with exercise. Advice that pre and post exercise glucose checks will be done for 3 sessions at entry of program.   Cardiac Rehab from 03/23/2017 in [facility] Cardiac and Pulmonary Rehab  Date  03/14/17  Educator  SB  Instruction Review Code  2- meets goals/outcomes       Knowledge Questionnaire Score:     Knowledge Questionnaire Score - 03/14/17 1400      Knowledge Questionnaire Score   Pre Score 26/28  Reviewed correct responses with return vebalization of understanding.   Explained all topics will be part of the education classes      Core Components/Risk Factors/Patient Goals at Admission:     Personal Goals and Risk Factors at Admission - 03/14/17 1404      Core Components/Risk Factors/Patient Goals on Admission    Weight Management Yes;Weight Loss;Obesity   Intervention Weight Management: Develop a combined nutrition and exercise program designed to reach desired caloric intake, while maintaining appropriate intake of nutrient and fiber, sodium and fats, and appropriate energy expenditure required for the weight goal.;Weight Management: Provide education and appropriate resources to help participant work on and attain dietary goals.;Weight Management/Obesity: Establish reasonable short term and long term weight goals.   Admit Weight 257 lb 3.2 oz (116.7 kg)   Goal Weight: Short Term 250 lb (113.4 kg)   Goal Weight: Long Term 190 lb (86.2 kg)   Expected Outcomes Short Term: Continue to assess and modify interventions until short term weight is achieved;Long Term: Adherence to nutrition and physical activity/exercise program aimed toward attainment of established weight goal;Weight Loss: Understanding of general recommendations for a balanced deficit meal  plan, which promotes 1-2 lb weight loss per week and includes a negative energy balance of [id number] kcal/d;Understanding recommendations for meals to include 15-35% energy as protein, 25-35% energy from fat, 35-60% energy from carbohydrates, less than 232m of dietary cholesterol, 20-35 gm of total fiber daily;Understanding of distribution of calorie intake throughout the day with the consumption of 4-5 meals/snacks   Diabetes Yes  HgbA1C around 7% presently   Intervention Provide education about signs/symptoms and action to take for hypo/hyperglycemia.;Provide education about proper nutrition, including hydration, and aerobic/resistive exercise prescription along with prescribed medications to achieve blood  glucose in normal ranges: Fasting glucose 65-99 mg/dL   Expected Outcomes Short Term: Participant verbalizes understanding of the signs/symptoms and immediate care of hyper/hypoglycemia, proper foot care and importance of medication, aerobic/resistive exercise and nutrition plan for blood glucose control.;Long Term: Attainment of HbA1C < 7%.   Hypertension Yes   Intervention Provide education on lifestyle modifcations including regular physical activity/exercise, weight management, moderate sodium restriction and increased consumption of fresh fruit, vegetables, and low fat dairy, alcohol moderation, and smoking cessation.;Monitor prescription use compliance.   Expected Outcomes Short Term: Continued assessment and intervention until BP is < 140/977mHG in hypertensive participants. < 130/8023mG in hypertensive participants with diabetes, heart failure or chronic kidney disease.;Long Term: Maintenance of blood pressure at goal levels.   Lipids Yes   Intervention Provide education and support for participant on nutrition & aerobic/resistive exercise along with prescribed medications to achieve LDL <20m60mDL >40mg42mExpected Outcomes Short Term: Participant states understanding of desired cholesterol values and is compliant with medications prescribed. Participant is following exercise prescription and nutrition guidelines.;Long Term: Cholesterol controlled with medications as prescribed, with individualized exercise RX and with personalized nutrition plan. Value goals: LDL < 20mg,68m > 40 mg.      Core Components/Risk Factors/Patient Goals Review:    Core Components/Risk Factors/Patient Goals at Discharge (Final Review):    ITP Comments:     ITP Comments    Row Name 03/14/17 1344 03/16/17 0555 03/18/17 1135 03/28/17 0936 04/06/17 1527   ITP Comments Medical review completed today. Initial ITP created. Documentation of diagnosis can be found Care Everywhere Admission  [id number] and    Office Visit 01/27/2017 30 day review. Continue with ITP unless directed changes per Medical Director review   New to program Kenady reports that she used to walk 5 miles/day for exercise after work. 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 She said she never felt bad but was working 60-80 hours /week and at age 19 sta56ed having kidney problems and eyesight macular degeration problems. Kammi reports that [facility] . Deanna said she used to be a normal healthy weight until she started taking insulin and "has ballooned up with her weight." Arliss said she is only able to work one day a week but it is hard since she has such poor vision.  Jhoselyn called out with gout again.  She will be out all week per MD request.  She has an appointment to be seen on Friday. Called to check on status of return.  Left message on voicemail.   [place]04/25/18 0538 [phone] 04/27/17 1516 05/02/17  1522 05/11/17 0826   ITP Comments 30 day review. Continue with ITP unless directed changes per Medical Director review Judy is out on medical leave.  She had cataract surgery on 4/30.  She will return once cleared to exercise.  Last visit was 03/23/17. Called to check on status of return post cataract/retina surgery.  Next Friday 5/18 is follow up visit and then will be to return for short time.  Then on June 1 she will have a pre-op for other eye to done. Satya continues to be out waiting for clearance to return.  Appt for this Friday. 30 day review. Continue with ITP unless directed changes per Medical Director review   [place] Name 05/18/17 1429 06/01/17 1532 06/08/17 0642 06/13/17 1609 07/06/17 0629   ITP Comments Called to check on status  of return for [place]. During absence, doctor has heard possible leaky valve and pt is scheduled to have echo on Monday.  She will know more after that appointment about returning to rehab. Earlyne had her second surgery on Monday 05/30/17.  She will need clearance to return to rehab post surgery.  Will follow up in two weeks. 30 day review. Continue with ITP unless directed changes per Medical Director review.  abent for medical reasons since 4/4 Debara has been through surgery for both eyes.  She has appointment on Friday for clearance from her eyes.  She continues to have a gout as well and has an appointment with a specialist next week.  She hopes to return soon. 30 day review. Continue with ITP unless directed changes per Medical Director review   Remains out with medical concerns      Comments:

## 2017-07-06 NOTE — Progress Notes (Signed)
Discharge Summary  Patient Details  Name: Sharon Arias MRN: [id number] Date of Birth: 1961/06/18 Referring Provider:     Cardiac Rehab from 03/14/2017 in [facility]  Referring Provider  [name] MD       Number of Visits: 7  Reason for Discharge:  Early Exit:  Personal  Smoking History:  History  Smoking Status  . Never Smoker  Smokeless Tobacco  . Never Used    Diagnosis:  Status post coronary artery stent placement  ADL UCSD:   Initial Exercise Prescription:     Initial Exercise Prescription - 03/14/17 1500      Date of Initial Exercise RX and Referring Provider   Date 03/14/17   Referring Provider [name] MD     Treadmill   MPH 1.8   Grade 0   Minutes 15   METs 2.32     Recumbant Bike   Level 1   RPM 50   Minutes 15   METs 2     NuStep   Level 1   SPM 80   Minutes 15   METs 2     Prescription Details   Frequency (times per week) 3   Duration Progress to 45 minutes of aerobic exercise without signs/symptoms of physical distress     Intensity   THRR 40-80% of Max Heartrate 103-144   Ratings of Perceived Exertion 11-13   Perceived Dyspnea 0-4     Progression   Progression Continue to progress workloads to maintain intensity without signs/symptoms of physical distress.     Resistance Training   Training Prescription Yes   Weight 2 lbs   Reps 10-15      Discharge Exercise Prescription (Final Exercise Prescription Changes):     Exercise Prescription Changes - 04/07/17 1100      Response to Exercise   Blood Pressure (Admit) 144/78   Blood Pressure (Exercise) 136/64   Blood Pressure (Exit) 132/78   Heart Rate (Admit) 74 bpm   Heart Rate (Exercise) 75 bpm   Heart Rate (Exit) 67 bpm   Rating of Perceived Exertion (Exercise) 15   Symptoms none   Comments has been out since 4/4 with gout   Duration Progress to 45 minutes of aerobic exercise without signs/symptoms of physical distress   Intensity THRR unchanged     Progression   Progression Continue to progress workloads to maintain intensity without signs/symptoms of physical distress.   Average METs 2.39     Resistance Training   Training Prescription Yes   Weight 2 lbs   Reps 10-15     Interval Training   Interval Training No     Treadmill   MPH 1.8   Grade 0   Minutes 15   METs 2.32     NuStep   Level 1   SPM 80   Minutes 15   METs 2.4      Functional Capacity:     6 Minute Walk    Row Name 03/14/17 1514         6 Minute Walk   Phase Initial     Distance 975 feet     Walk Time 4.77 minutes     # of Rest Breaks 1  rested 1:14      MPH 2.32     METS 2.35     RPE 15     Perceived Dyspnea  3     VO2 Peak 8.21     Symptoms Yes (comment)  Comments dizzy and lightheaded, SOB     Resting HR 62 bpm     Resting BP 128/64     Max Ex. HR 93 bpm     Max Ex. BP 126/54     2 Minute Post BP 116/60        Psychological, QOL, Others - Outcomes: PHQ 2/9: Depression screen PHQ 2/9 03/14/2017  Decreased Interest 0  Down, Depressed, Hopeless 0  PHQ - 2 Score 0  Altered sleeping 2  Tired, decreased energy 3  Change in appetite 0  Feeling bad or failure about yourself  0  Trouble concentrating 0  Moving slowly or fidgety/restless 0  Suicidal thoughts 0  PHQ-9 Score 5  Difficult doing work/chores Somewhat difficult    Quality of Life:     Quality of Life - 03/14/17 1356      Quality of Life Scores   Health/Function Pre 17.43 %   Socioeconomic Pre 23.71 %   Psych/Spiritual Pre 26 %   Family Pre 25.2 %   GLOBAL Pre 21.63 %      Personal Goals: Goals established at orientation with interventions provided to work toward goal.     Personal Goals and Risk Factors at Admission - 03/14/17 1404      Core Components/Risk Factors/Patient Goals on Admission    Weight Management Yes;Weight Loss;Obesity   Intervention Weight Management: Develop a combined nutrition and exercise program  designed to reach desired caloric intake, while maintaining appropriate intake of nutrient and fiber, sodium and fats, and appropriate energy expenditure required for the weight goal.;Weight Management: Provide education and appropriate resources to help participant work on and attain dietary goals.;Weight Management/Obesity: Establish reasonable short term and long term weight goals.   Admit Weight 257 lb 3.2 oz (116.7 kg)   Goal Weight: Short Term 250 lb (113.4 kg)   Goal Weight: Long Term 190 lb (86.2 kg)   Expected Outcomes Short Term: Continue to assess and modify interventions until short term weight is achieved;Long Term: Adherence to nutrition and physical activity/exercise program aimed toward attainment of established weight goal;Weight Loss: Understanding of general recommendations for a balanced deficit meal plan, which promotes 1-2 lb weight loss per week and includes a negative energy balance of [phone] kcal/d;Understanding recommendations for meals to include 15-35% energy as protein, 25-35% energy from fat, 35-60% energy from carbohydrates, less than 200mg  of dietary cholesterol, 20-35 gm of total fiber daily;Understanding of distribution of calorie intake throughout the day with the consumption of 4-5 meals/snacks   Diabetes Yes  HgbA1C around 7% presently   Intervention Provide education about signs/symptoms and action to take for hypo/hyperglycemia.;Provide education about proper nutrition, including hydration, and aerobic/resistive exercise prescription along with prescribed medications to achieve blood glucose in normal ranges: Fasting glucose 65-99 mg/dL   Expected Outcomes Short Term: Participant verbalizes understanding of the signs/symptoms and immediate care of hyper/hypoglycemia, proper foot care and importance of medication, aerobic/resistive exercise and nutrition plan for blood glucose control.;Long Term: Attainment of HbA1C < 7%.   Hypertension Yes   Intervention Provide  education on lifestyle modifcations including regular physical activity/exercise, weight management, moderate sodium restriction and increased consumption of fresh fruit, vegetables, and low fat dairy, alcohol moderation, and smoking cessation.;Monitor prescription use compliance.   Expected Outcomes Short Term: Continued assessment and intervention until BP is < 140/39mm HG in hypertensive participants. < 130/34mm HG in hypertensive participants with diabetes, heart failure or chronic kidney disease.;Long Term: Maintenance of blood pressure at goal levels.  Lipids Yes   Intervention Provide education and support for participant on nutrition & aerobic/resistive exercise along with prescribed medications to achieve LDL 70mg , HDL >40mg .   Expected Outcomes Short Term: Participant states understanding of desired cholesterol values and is compliant with medications prescribed. Participant is following exercise prescription and nutrition guidelines.;Long Term: Cholesterol controlled with medications as prescribed, with individualized exercise RX and with personalized nutrition plan. Value goals: LDL < 70mg , HDL > 40 mg.       Personal Goals Discharge:   Nutrition & Weight - Outcomes:     Pre Biometrics - 03/14/17 1518      Pre Biometrics   Height 5' 8.5" (1.74 m)   Weight 257 lb 3.2 oz (116.7 kg)   Waist Circumference 48 inches   Hip Circumference 50 inches   Waist to Hip Ratio 0.96 %   BMI (Calculated) 38.6   Single Leg Stand 3.43 seconds       Nutrition:     Nutrition Therapy & Goals - 03/14/17 1351      Intervention Plan   Intervention Prescribe, educate and counsel regarding individualized specific dietary modifications aiming towards targeted core components such as weight, hypertension, lipid management, diabetes, heart failure and other comorbidities.   Expected Outcomes Short Term Goal: Understand basic principles of dietary content, such as calories, fat, sodium, cholesterol  and nutrients.;Short Term Goal: A plan has been developed with personal nutrition goals set during dietitian appointment.;Long Term Goal: Adherence to prescribed nutrition plan.      Nutrition Discharge:     Nutrition Assessments - 03/14/17 1401      MEDFICTS Scores   Pre Score 9      Education Questionnaire Score:     Knowledge Questionnaire Score - 03/14/17 1400      Knowledge Questionnaire Score   Pre Score 26/28  Reviewed correct responses with return vebalization of understanding.  Explained all topics will be part of the education classes      Goals reviewed with patient; copy given to patient.

## 2017-07-06 NOTE — Progress Notes (Signed)
Cardiac Individual Treatment Plan  Patient Details  Name: Sharon Arias MRN: [id number] Date of Birth: Nov 04, 1961 Referring Provider:     Cardiac Rehab from 03/14/2017 in [facility] Cardiac and Pulmonary Rehab  Referring Provider  [name] MD      Initial Encounter Date:    Cardiac Rehab from 03/14/2017 in [facility] Cardiac and Pulmonary Rehab  Date  03/14/17  Referring Provider  [name] MD      Visit Diagnosis: Status post coronary artery stent placement  Patient's Home Medications on Admission:  Current Outpatient Prescriptions:  .  allopurinol (ZYLOPRIM) 100 MG tablet, Take by mouth., Disp: , Rfl:  .  amLODipine (NORVASC) 10 MG tablet, , Disp: , Rfl:  .  aspirin EC 81 MG tablet, Take by mouth., Disp: , Rfl:  .  atorvastatin (LIPITOR) 40 MG tablet, Take by mouth., Disp: , Rfl:  .  furosemide (LASIX) 40 MG tablet, Take 1 tablet by mouth  daily, Disp: , Rfl:  .  Insulin Glargine (LANTUS SOLOSTAR) 100 UNIT/ML Solostar Pen, Inject subcutaneously 74  units at bedtime ( Split  into 2 shots of 37 units  each at 2 different sites ), Disp: , Rfl:  .  insulin lispro (HUMALOG KWIKPEN) 100 UNIT/ML KiwkPen, Inject subcutaneously 22 units with breakfast,24 units with lunch,and 32 units with dinner, plus sliding scale as directed., Disp: , Rfl:  .  losartan (COZAAR) 100 MG tablet, , Disp: , Rfl:  .  metoprolol (LOPRESSOR) 50 MG tablet, Take 1 tablet by mouth two  times daily, Disp: , Rfl:  .  ticagrelor (BRILINTA) 90 MG TABS tablet, Take by mouth., Disp: , Rfl:   Past Medical History: Past Medical History:  Diagnosis Date  . Anemia in chronic kidney disease 05/19/2016  . CAD (coronary artery disease)   . Chicken pox   . Chronic kidney disease    per dr sparks  . CVA (cerebral vascular accident) ([place])   . Detached retina   . Diabetes mellitus without complication ([place])    type 2  . Diabetic neuropathy ([place])   . Diabetic retinopathy ([place])    legally blind  . Hyperlipidemia    . Hypertension   . Myocardial infarction   . Obesity   . PONV (postoperative nausea and vomiting)   . Sciatica of right side    going to see physiciatry    Tobacco Use: History  Smoking Status  . Never Smoker  Smokeless Tobacco  . Never Used    Labs: Recent Review Flowsheet Data    There is no flowsheet data to display.       Exercise Target Goals:    Exercise Program Goal: Individual exercise prescription set with THRR, safety & activity barriers. Participant demonstrates ability to understand and report RPE using BORG scale, to self-measure pulse accurately, and to acknowledge the importance of the exercise prescription.  Exercise Prescription Goal: Starting with aerobic activity 30 plus minutes a day, 3 days per week for initial exercise prescription. Provide home exercise prescription and guidelines that participant acknowledges understanding prior to discharge.  Activity Barriers & Risk Stratification:     Activity Barriers & Cardiac Risk Stratification - 03/14/17 1405      Activity Barriers & Cardiac Risk Stratification   Activity Barriers Back Problems;Other (comment);Balance Concerns;Muscular Weakness;Deconditioning;Shortness of Breath   Comments GOUT, Vision Loss, painful to walk   Cardiac Risk Stratification High      6 Minute Walk:     6 Minute Walk  [place] Name 03/14/17 1514         6 Minute Walk   Phase Initial     Distance 975 feet     Walk Time 4.77 minutes     # of Rest Breaks 1  rested 1:14      MPH 2.32     METS 2.35     RPE 15     Perceived Dyspnea  3     VO2 Peak 8.21     Symptoms Yes (comment)     Comments dizzy and lightheaded, SOB     Resting HR 62 bpm     Resting BP 128/64     Max Ex. HR 93 bpm     Max Ex. BP 126/54     2 Minute Post BP 116/60        Oxygen Initial Assessment:   Oxygen Re-Evaluation:   Oxygen Discharge (Final Oxygen Re-Evaluation):   Initial Exercise Prescription:     Initial Exercise  Prescription - 03/14/17 1500      Date of Initial Exercise RX and Referring Provider   Date 03/14/17   Referring Provider [name] MD     Treadmill   MPH 1.8   Grade 0   Minutes 15   METs 2.32     Recumbant Bike   Level 1   RPM 50   Minutes 15   METs 2     NuStep   Level 1   SPM 80   Minutes 15   METs 2     Prescription Details   Frequency (times per week) 3   Duration Progress to 45 minutes of aerobic exercise without signs/symptoms of physical distress     Intensity   THRR 40-80% of Max Heartrate 103-144   Ratings of Perceived Exertion 11-13   Perceived Dyspnea 0-4     Progression   Progression Continue to progress workloads to maintain intensity without signs/symptoms of physical distress.     Resistance Training   Training Prescription Yes   Weight 2 lbs   Reps 10-15      Perform Capillary Blood Glucose checks as needed.  Exercise Prescription Changes:     Exercise Prescription Changes    Row Name 03/14/17 1500 03/22/17 1000 04/07/17 1100         Response to Exercise   Blood Pressure (Admit) 128/64 132/70 144/78     Blood Pressure (Exercise) 126/54 148/80 136/64     Blood Pressure (Exit) 116/60 126/60 132/78     Heart Rate (Admit) 62 bpm 69 bpm 74 bpm     Heart Rate (Exercise) 93 bpm 101 bpm 75 bpm     Heart Rate (Exit) 67 bpm 67 bpm 67 bpm     Oxygen Saturation (Admit) 100 %  -  -     Oxygen Saturation (Exercise) 99 %  -  -     Rating of Perceived Exertion (Exercise) _0 Perceived Dyspnea (Exercise) 3  -  -     Symptoms dizzy, lightheaded, SON none none     Comments walk test results  - has been out since 4/4 with gout     Duration  - Progress to 45 minutes of aerobic exercise without signs/symptoms of physical distress Progress to 45 minutes of aerobic exercise without signs/symptoms of physical distress     Intensity  - THRR unchanged THRR unchanged       Progression   Progression  - Continue to  progress workloads to  maintain intensity without signs/symptoms of physical distress. Continue to progress workloads to maintain intensity without signs/symptoms of physical distress.     Average METs  - 2.26 2.39       Resistance Training   Training Prescription  - Yes Yes     Weight  - 2 lbs 2 lbs     Reps  - 10-15 10-15       Interval Training   Interval Training  - No No       Treadmill   MPH  - 1.8 1.8     Grade  - 0 0     Minutes  - 15 15     METs  - 2.32 2.32       Recumbant Bike   Level  - 1  -     RPM  - 50  -     Minutes  - 15  -     METs  - 1.8  -       NuStep   Level  - 1 1     SPM  - 80 80     Minutes  - 15 15     METs  - 2.6 2.4        Exercise Comments:     Exercise Comments    Row Name 03/16/17 0914 03/23/17 3500 04/21/17 1129       Exercise Comments First full day of exercise!  Patient was oriented to gym and equipment including functions, settings, policies, and procedures.  Patient's individual exercise prescription and treatment plan were reviewed.  All starting workloads were established based on the results of the 6 minute walk test done at initial orientation visit.  The plan for exercise progression was also introduced and progression will be customized based on patient's performance and goals. Sharon Arias has back pain - has had testing for disc problems etc - no issues.  I recommended the back pain class at the [facility] and spoke with her about how to find a good massage therapist and gave the number for [facility]. Sharon Arias is out on medical leave.  She had cataract surgery on 4/30.  She will return once cleared to exercise.  Last visit was 03/23/17.        Exercise Goals and Review:     Exercise Goals    Row Name 03/14/17 1518             Exercise Goals   Increase Physical Activity Yes  get into exercise routine       Intervention Provide advice, education, support and counseling about physical activity/exercise needs.;Develop an individualized exercise  prescription for aerobic and resistive training based on initial evaluation findings, risk stratification, comorbidities and participant's personal goals.       Expected Outcomes Achievement of increased cardiorespiratory fitness and enhanced flexibility, muscular endurance and strength shown through measurements of functional capacity and personal statement of participant.       Increase Strength and Stamina Yes  increase energy       Intervention Provide advice, education, support and counseling about physical activity/exercise needs.;Develop an individualized exercise prescription for aerobic and resistive training based on initial evaluation findings, risk stratification, comorbidities and participant's personal goals.       Expected Outcomes Achievement of increased cardiorespiratory fitness and enhanced flexibility, muscular endurance and strength shown through measurements of functional capacity and personal statement of participant.          Exercise  Goals Re-Evaluation :     Exercise Goals Re-Evaluation    Row Name 03/22/17 1036 04/07/17 1135 05/18/17 1430 06/13/17 1609       Exercise Goal Re-Evaluation   Exercise Goals Review Increase Physical Activity;Increase Strenth and Stamina Increase Physical Activity;Increase Strenth and Stamina  -  -    Comments Sharon Arias is off to a good start with rehab.  She has been able to get around on her own and just needs help to make sure her weight and settings are correct.  She feels that she is getting a good workout during rehab.  We will continue to monitor her progression. Out since 4/4 with gout, no progression noted.  We will continue to monitor Out since 4/4 Out since 4/4    Expected Outcomes Short: Sharon Arias will start to increase some workloads.  Long: Continue to come to classes to work on strength and stamina. Short and Long: Able to return to rehab with improved attendance to work on strength and stamina.  -  -       Discharge Exercise  Prescription (Final Exercise Prescription Changes):     Exercise Prescription Changes - 04/07/17 1100      Response to Exercise   Blood Pressure (Admit) 144/78   Blood Pressure (Exercise) 136/64   Blood Pressure (Exit) 132/78   Heart Rate (Admit) 74 bpm   Heart Rate (Exercise) 75 bpm   Heart Rate (Exit) 67 bpm   Rating of Perceived Exertion (Exercise) 15   Symptoms none   Comments has been out since 4/4 with gout   Duration Progress to 45 minutes of aerobic exercise without signs/symptoms of physical distress   Intensity THRR unchanged     Progression   Progression Continue to progress workloads to maintain intensity without signs/symptoms of physical distress.   Average METs 2.39     Resistance Training   Training Prescription Yes   Weight 2 lbs   Reps 10-15     Interval Training   Interval Training No     Treadmill   MPH 1.8   Grade 0   Minutes 15   METs 2.32     NuStep   Level 1   SPM 80   Minutes 15   METs 2.4      Nutrition:  Target Goals: Understanding of nutrition guidelines, daily intake of sodium <1561m, cholesterol <2058m calories 30% from fat and 7% or less from saturated fats, daily to have 5 or more servings of fruits and vegetables.  Biometrics:     Pre Biometrics - 03/14/17 1518      Pre Biometrics   Height 5' 8.5" (1.74 m)   Weight 257 lb 3.2 oz (116.7 kg)   Waist Circumference 48 inches   Hip Circumference 50 inches   Waist to Hip Ratio 0.96 %   BMI (Calculated) 38.6   Single Leg Stand 3.43 seconds       Nutrition Therapy Plan and Nutrition Goals:     Nutrition Therapy & Goals - 03/14/17 1351      Intervention Plan   Intervention Prescribe, educate and counsel regarding individualized specific dietary modifications aiming towards targeted core components such as weight, hypertension, lipid management, diabetes, heart failure and other comorbidities.   Expected Outcomes Short Term Goal: Understand basic principles of dietary  content, such as calories, fat, sodium, cholesterol and nutrients.;Short Term Goal: A plan has been developed with personal nutrition goals set during dietitian appointment.;Long Term Goal: Adherence to prescribed nutrition plan.  Nutrition Discharge: Rate Your Plate Scores:     Nutrition Assessments - 03/14/17 1401      MEDFICTS Scores   Pre Score 9      Nutrition Goals Re-Evaluation:   Nutrition Goals Discharge (Final Nutrition Goals Re-Evaluation):   Psychosocial: Target Goals: Acknowledge presence or absence of significant depression and/or stress, maximize coping skills, provide positive support system. Participant is able to verbalize types and ability to use techniques and skills needed for reducing stress and depression.   Initial Review & Psychosocial Screening:     Initial Psych Review & Screening - 03/14/17 1355      Initial Review   Current issues with Current Sleep Concerns  New not getting to sleep and able to stay asleep     [place]? Yes  Husband and adult children     Barriers   Psychosocial barriers to participate in program There are no identifiable barriers or psychosocial needs.;The patient should benefit from training in stress management and relaxation.     Screening Interventions   Interventions Encouraged to exercise      Quality of Life Scores:      Quality of Life - 03/14/17 1356      Quality of Life Scores   Health/Function Pre 17.43 %   Socioeconomic Pre 23.71 %   Psych/Spiritual Pre 26 %   Family Pre 25.2 %   GLOBAL Pre 21.63 %      PHQ-9: Recent Review Flowsheet Data    Depression screen [facility] 2/9 03/14/2017   Decreased Interest 0   Down, Depressed, Hopeless 0   PHQ - 2 Score 0   Altered sleeping 2    Tired, decreased energy 3    Change in appetite 0   Feeling bad or failure about yourself  0   Trouble concentrating 0   Moving slowly or fidgety/restless 0   Suicidal thoughts 0   PHQ-9  Score 5   Difficult doing work/chores Somewhat difficult     Interpretation of Total Score  Total Score Depression Severity:  1-4 = Minimal depression, 5-9 = Mild depression, 10-14 = Moderate depression, 15-19 = Moderately severe depression, 20-27 = Severe depression   Psychosocial Evaluation and Intervention:     Psychosocial Evaluation - 03/21/17 0946      Psychosocial Evaluation & Interventions   Comments Counselor met with Sharon Arias today for initial psychosocial evaluation.  She is a 56 year old who had triple by-pass 5 years ago and a stent inserted one month ago.  She has a strong support system with a spouse of 27 years and a daughter who lives close by.  Sharon Arias has multiple health issues with diabetes; poor kidney function; vision issues and anemia.  She sleeps well and has a good appetite.  Sharon Arias denies a history of depression or anxiety, but has reported several incidents over the past two years that resembled a panic attack and states that she sometimes has difficulty falling asleep due to worrying about her grandkids. She is typically in a positive mood and reports minimal stress; other than worrying about her grandchildren and not being able to drive because of her vision.  Sharon Arias has goals to increase her stamina and strength and just "get more energy" while in this program.  She plans to continue exercising by using her treadmill and bike at home.  Staff will continue to follow with Sharon Arias throughout the course of this program.     Expected Outcomes Sharon Arias will  exercise consistently and increase her energy.  She will also participate in the psychoeducational components of this program in order to learn how to relax and manage stress and anxiety better in her life.        Psychosocial Re-Evaluation:   Psychosocial Discharge (Final Psychosocial Re-Evaluation):   Vocational Rehabilitation: Provide vocational rehab assistance to qualifying candidates.   Vocational Rehab  Evaluation & Intervention:     Vocational Rehab - 03/14/17 1401      Initial Vocational Rehab Evaluation & Intervention   Assessment shows need for Vocational Rehabilitation No      Education: Education Goals: Education classes will be provided on a weekly basis, covering required topics. Participant will state understanding/return demonstration of topics presented.  Learning Barriers/Preferences:     Learning Barriers/Preferences - 03/14/17 1359      Learning Barriers/Preferences   Learning Barriers Sight  unable to see secondary to eye disease   Learning Preferences Audio;Verbal Instruction      Education Topics: General Nutrition Guidelines/Fats and Fiber: -Group instruction provided by verbal, written material, models and posters to present the general guidelines for heart healthy nutrition. Gives an explanation and review of dietary fats and fiber.   Controlling Sodium/Reading Food Labels: -Group verbal and written material supporting the discussion of sodium use in heart healthy nutrition. Review and explanation with models, verbal and written materials for utilization of the food label.   Exercise Physiology & Risk Factors: - Group verbal and written instruction with models to review the exercise physiology of the cardiovascular system and associated critical values. Details cardiovascular disease risk factors and the goals associated with each risk factor.   Aerobic Exercise & Resistance Training: - Gives group verbal and written discussion on the health impact of inactivity. On the components of aerobic and resistive training programs and the benefits of this training and how to safely progress through these programs.   Flexibility, Balance, General Exercise Guidelines: - Provides group verbal and written instruction on the benefits of flexibility and balance training programs. Provides general exercise guidelines with specific guidelines to those with heart or  lung disease. Demonstration and skill practice provided.   Stress Management: - Provides group verbal and written instruction about the health risks of elevated stress, cause of high stress, and healthy ways to reduce stress.   Cardiac Rehab from 03/23/2017 in [facility] Cardiac and Pulmonary Rehab  Date  03/23/17  Educator  [facility]  Instruction Review Code  2- meets goals/outcomes      Depression: - Provides group verbal and written instruction on the correlation between heart/lung disease and depressed mood, treatment options, and the stigmas associated with seeking treatment.   Anatomy & Physiology of the Heart: - Group verbal and written instruction and models provide basic cardiac anatomy and physiology, with the coronary electrical and arterial systems. Review of: AMI, Angina, Valve disease, Heart Failure, Cardiac Arrhythmia, Pacemakers, and the ICD.   Cardiac Rehab from 03/23/2017 in [facility] Cardiac and Pulmonary Rehab  Date  03/21/17  Educator  CE  Instruction Review Code  2- meets goals/outcomes      Cardiac Procedures: - Group verbal and written instruction and models to describe the testing methods done to diagnose heart disease. Reviews the outcomes of the test results. Describes the treatment choices: Medical Management, Angioplasty, or Coronary Bypass Surgery.   Cardiac Medications: - Group verbal and written instruction to review commonly prescribed medications for heart disease. Reviews the medication, class of the drug, and side effects. Includes the steps  to properly store meds and maintain the prescription regimen.   Go Sex-Intimacy & Heart Disease, Get SMART - Goal Setting: - Group verbal and written instruction through game format to discuss heart disease and the return to sexual intimacy. Provides group verbal and written material to discuss and apply goal setting through the application of the S.M.A.R.T. Method.   Other Matters of the Heart: - Provides group verbal, written  materials and models to describe Heart Failure, Angina, Valve Disease, and Diabetes in the realm of heart disease. Includes description of the disease process and treatment options available to the cardiac patient.   Cardiac Rehab from 03/23/2017 in [facility] Cardiac and Pulmonary Rehab  Date  03/21/17  Educator  CE  Instruction Review Code  2- meets goals/outcomes      Exercise & Equipment Safety: - Individual verbal instruction and demonstration of equipment use and safety with use of the equipment.   Cardiac Rehab from 03/23/2017 in [facility] Cardiac and Pulmonary Rehab  Date  03/14/17  Educator  Sb  Instruction Review Code  2- meets goals/outcomes      Infection Prevention: - Provides verbal and written material to individual with discussion of infection control including proper hand washing and proper equipment cleaning during exercise session.   Cardiac Rehab from 03/23/2017 in [facility] Cardiac and Pulmonary Rehab  Date  03/14/17  Educator  SB  Instruction Review Code  2- meets goals/outcomes      Falls Prevention: - Provides verbal and written material to individual with discussion of falls prevention and safety.   Cardiac Rehab from 03/23/2017 in [facility] Cardiac and Pulmonary Rehab  Date  03/14/17  Educator  SB  Instruction Review Code  2- meets goals/outcomes      Diabetes: - Individual verbal and written instruction to review signs/symptoms of diabetes, desired ranges of glucose level fasting, after meals and with exercise. Advice that pre and post exercise glucose checks will be done for 3 sessions at entry of program.   Cardiac Rehab from 03/23/2017 in [facility] Cardiac and Pulmonary Rehab  Date  03/14/17  Educator  SB  Instruction Review Code  2- meets goals/outcomes       Knowledge Questionnaire Score:     Knowledge Questionnaire Score - 03/14/17 1400      Knowledge Questionnaire Score   Pre Score 26/28  Reviewed correct responses with return vebalization of understanding.   Explained all topics will be part of the education classes      Core Components/Risk Factors/Patient Goals at Admission:     Personal Goals and Risk Factors at Admission - 03/14/17 1404      Core Components/Risk Factors/Patient Goals on Admission    Weight Management Yes;Weight Loss;Obesity   Intervention Weight Management: Develop a combined nutrition and exercise program designed to reach desired caloric intake, while maintaining appropriate intake of nutrient and fiber, sodium and fats, and appropriate energy expenditure required for the weight goal.;Weight Management: Provide education and appropriate resources to help participant work on and attain dietary goals.;Weight Management/Obesity: Establish reasonable short term and long term weight goals.   Admit Weight 257 lb 3.2 oz (116.7 kg)   Goal Weight: Short Term 250 lb (113.4 kg)   Goal Weight: Long Term 190 lb (86.2 kg)   Expected Outcomes Short Term: Continue to assess and modify interventions until short term weight is achieved;Long Term: Adherence to nutrition and physical activity/exercise program aimed toward attainment of established weight goal;Weight Loss: Understanding of general recommendations for a balanced deficit meal  plan, which promotes 1-2 lb weight loss per week and includes a negative energy balance of [phone] kcal/d;Understanding recommendations for meals to include 15-35% energy as protein, 25-35% energy from fat, 35-60% energy from carbohydrates, less than 2100m of dietary cholesterol, 20-35 gm of total fiber daily;Understanding of distribution of calorie intake throughout the day with the consumption of 4-5 meals/snacks   Diabetes Yes  HgbA1C around 7% presently   Intervention Provide education about signs/symptoms and action to take for hypo/hyperglycemia.;Provide education about proper nutrition, including hydration, and aerobic/resistive exercise prescription along with prescribed medications to achieve blood  glucose in normal ranges: Fasting glucose 65-99 mg/dL   Expected Outcomes Short Term: Participant verbalizes understanding of the signs/symptoms and immediate care of hyper/hypoglycemia, proper foot care and importance of medication, aerobic/resistive exercise and nutrition plan for blood glucose control.;Long Term: Attainment of HbA1C < 7%.   Hypertension Yes   Intervention Provide education on lifestyle modifcations including regular physical activity/exercise, weight management, moderate sodium restriction and increased consumption of fresh fruit, vegetables, and low fat dairy, alcohol moderation, and smoking cessation.;Monitor prescription use compliance.   Expected Outcomes Short Term: Continued assessment and intervention until BP is < 140/942mHG in hypertensive participants. < 130/8024mG in hypertensive participants with diabetes, heart failure or chronic kidney disease.;Long Term: Maintenance of blood pressure at goal levels.   Lipids Yes   Intervention Provide education and support for participant on nutrition & aerobic/resistive exercise along with prescribed medications to achieve LDL <47m80mDL >40mg4mExpected Outcomes Short Term: Participant states understanding of desired cholesterol values and is compliant with medications prescribed. Participant is following exercise prescription and nutrition guidelines.;Long Term: Cholesterol controlled with medications as prescribed, with individualized exercise RX and with personalized nutrition plan. Value goals: LDL < 47mg,81m > 40 mg.      Core Components/Risk Factors/Patient Goals Review:    Core Components/Risk Factors/Patient Goals at Discharge (Final Review):    ITP Comments:     ITP Comments    Row Name 03/14/17 1344 03/16/17 0555 03/18/17 1135 03/28/17 0936 04/06/17 1527   ITP Comments Medical review completed today. Initial ITP created. Documentation of diagnosis can be found Care Everywhere Admission  [id number] and    Office Visit 01/27/2017 30 day review. Continue with ITP unless directed changes per Medical Director review   New to program Sharon Arias reports that she used to walk 5 miles/day for exercise after work. 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 She said she never felt bad but was working 60-80 hours /week and at age 41 sta75ed having kidney problems and eyesight macular degeration problems. Sharon Arias [facility] . Sharon Arias said she used to be a normal healthy weight until she started taking insulin and "has ballooned up with her weight." Arliss said she is only able to work one day a week but it is hard since she has such poor vision.  Sharon Arias called out with gout again.  She will be out all week per MD request.  She has an appointment to be seen on Friday. Called to check on status of return.  Left message on voicemail.   [place]04/25/18 0538 [id number] 04/27/17 1516 05/02/17  1522 05/11/17 0826   ITP Comments 30 day review. Continue with ITP unless directed changes per Medical Director review Sharon Arias is out on medical leave.  She had cataract surgery on 4/30.  She will return once cleared to exercise.  Last visit was 03/23/17. Called to check on status of return post cataract/retina surgery.  Next Friday 5/18 is follow up visit and then will be to return for short time.  Then on June 1 she will have a pre-op for other eye to done. Sharon Arias continues to be out waiting for clearance to return.  Appt for this Friday. 30 day review. Continue with ITP unless directed changes per Medical Director review   [place] Name 05/18/17 1429 06/01/17 1532 06/08/17 0642 06/13/17 1609 07/06/17 0629   ITP Comments Called to check on status  of return for Sharon Arias. During absence, doctor has heard possible leaky valve and pt is scheduled to have echo on Monday.  She will know more after that appointment about returning to rehab. Sharon Arias had her second surgery on Monday 05/30/17.  She will need clearance to return to rehab post surgery.  Will follow up in two weeks. 30 day review. Continue with ITP unless directed changes per Medical Director review.  abent for medical reasons since 4/4 Sharon Arias has been through surgery for both eyes.  She has appointment on Friday for clearance from her eyes.  She continues to have a gout as well and has an appointment with a specialist next week.  She hopes to return soon. 30 day review. Continue with ITP unless directed changes per Medical Director review   Remains out with medical concerns   [place] Name 07/06/17 1541           ITP Comments Called to check on status of patient to return.  Her gout is staying with her and having difficulty walking.  We are going to discharge her at this time and hopes to bring her back in the future once things calms down.          Comments: Discharge ITP

## 2017-07-06 NOTE — Telephone Encounter (Signed)
Called to check on status of patient to return.  Her gout is staying with her and having difficulty walking.  We are going to discharge her at this time and hopes to bring her back in the future once things calms down.

## 2017-07-07 DIAGNOSIS — N184 Chronic kidney disease, stage 4 (severe): Secondary | ICD-10-CM | POA: Diagnosis not present

## 2017-07-07 DIAGNOSIS — I129 Hypertensive chronic kidney disease with stage 1 through stage 4 chronic kidney disease, or unspecified chronic kidney disease: Secondary | ICD-10-CM | POA: Diagnosis not present

## 2017-07-07 DIAGNOSIS — R809 Proteinuria, unspecified: Secondary | ICD-10-CM | POA: Diagnosis not present

## 2017-07-18 DIAGNOSIS — E1142 Type 2 diabetes mellitus with diabetic polyneuropathy: Secondary | ICD-10-CM | POA: Diagnosis not present

## 2017-07-18 DIAGNOSIS — Z79899 Other long term (current) drug therapy: Secondary | ICD-10-CM | POA: Diagnosis not present

## 2017-07-18 DIAGNOSIS — E113593 Type 2 diabetes mellitus with proliferative diabetic retinopathy without macular edema, bilateral: Secondary | ICD-10-CM | POA: Diagnosis not present

## 2017-07-18 DIAGNOSIS — I10 Essential (primary) hypertension: Secondary | ICD-10-CM | POA: Diagnosis not present

## 2017-07-18 DIAGNOSIS — M1A00X0 Idiopathic chronic gout, unspecified site, without tophus (tophi): Secondary | ICD-10-CM | POA: Diagnosis not present

## 2017-08-26 DIAGNOSIS — M1A00X0 Idiopathic chronic gout, unspecified site, without tophus (tophi): Secondary | ICD-10-CM | POA: Diagnosis not present

## 2017-08-26 DIAGNOSIS — Z79899 Other long term (current) drug therapy: Secondary | ICD-10-CM | POA: Diagnosis not present

## 2017-08-29 DIAGNOSIS — M5136 Other intervertebral disc degeneration, lumbar region: Secondary | ICD-10-CM | POA: Diagnosis not present

## 2017-08-29 DIAGNOSIS — N184 Chronic kidney disease, stage 4 (severe): Secondary | ICD-10-CM | POA: Diagnosis not present

## 2017-08-29 DIAGNOSIS — M1A00X0 Idiopathic chronic gout, unspecified site, without tophus (tophi): Secondary | ICD-10-CM | POA: Diagnosis not present

## 2017-09-12 DIAGNOSIS — R5383 Other fatigue: Secondary | ICD-10-CM | POA: Diagnosis not present

## 2017-09-12 DIAGNOSIS — E1165 Type 2 diabetes mellitus with hyperglycemia: Secondary | ICD-10-CM | POA: Diagnosis not present

## 2017-09-13 DIAGNOSIS — E1142 Type 2 diabetes mellitus with diabetic polyneuropathy: Secondary | ICD-10-CM | POA: Diagnosis not present

## 2017-09-13 DIAGNOSIS — I639 Cerebral infarction, unspecified: Secondary | ICD-10-CM | POA: Diagnosis not present

## 2017-09-13 DIAGNOSIS — I10 Essential (primary) hypertension: Secondary | ICD-10-CM | POA: Diagnosis not present

## 2017-09-27 DIAGNOSIS — G4733 Obstructive sleep apnea (adult) (pediatric): Secondary | ICD-10-CM | POA: Diagnosis not present

## 2017-10-10 DIAGNOSIS — N184 Chronic kidney disease, stage 4 (severe): Secondary | ICD-10-CM | POA: Diagnosis not present

## 2017-10-10 DIAGNOSIS — R809 Proteinuria, unspecified: Secondary | ICD-10-CM | POA: Diagnosis not present

## 2017-10-10 DIAGNOSIS — I129 Hypertensive chronic kidney disease with stage 1 through stage 4 chronic kidney disease, or unspecified chronic kidney disease: Secondary | ICD-10-CM | POA: Diagnosis not present

## 2017-10-10 DIAGNOSIS — E1122 Type 2 diabetes mellitus with diabetic chronic kidney disease: Secondary | ICD-10-CM | POA: Diagnosis not present

## 2017-10-27 DIAGNOSIS — M1A00X0 Idiopathic chronic gout, unspecified site, without tophus (tophi): Secondary | ICD-10-CM | POA: Diagnosis not present

## 2017-10-27 DIAGNOSIS — N184 Chronic kidney disease, stage 4 (severe): Secondary | ICD-10-CM | POA: Diagnosis not present

## 2017-10-31 DIAGNOSIS — R7989 Other specified abnormal findings of blood chemistry: Secondary | ICD-10-CM | POA: Diagnosis not present

## 2017-10-31 DIAGNOSIS — E042 Nontoxic multinodular goiter: Secondary | ICD-10-CM | POA: Diagnosis not present

## 2017-10-31 DIAGNOSIS — Z23 Encounter for immunization: Secondary | ICD-10-CM | POA: Diagnosis not present

## 2017-10-31 DIAGNOSIS — R946 Abnormal results of thyroid function studies: Secondary | ICD-10-CM | POA: Diagnosis not present

## 2017-11-15 DIAGNOSIS — E042 Nontoxic multinodular goiter: Secondary | ICD-10-CM | POA: Diagnosis not present

## 2017-12-23 DIAGNOSIS — Z79899 Other long term (current) drug therapy: Secondary | ICD-10-CM | POA: Diagnosis not present

## 2017-12-23 DIAGNOSIS — E782 Mixed hyperlipidemia: Secondary | ICD-10-CM | POA: Diagnosis not present

## 2017-12-23 DIAGNOSIS — I10 Essential (primary) hypertension: Secondary | ICD-10-CM | POA: Diagnosis not present

## 2018-01-05 DIAGNOSIS — G4733 Obstructive sleep apnea (adult) (pediatric): Secondary | ICD-10-CM | POA: Diagnosis not present

## 2018-03-18 ENCOUNTER — Other Ambulatory Visit: Payer: Self-pay

## 2018-03-18 ENCOUNTER — Inpatient Hospital Stay
Admission: EM | Admit: 2018-03-18 | Discharge: 2018-03-20 | DRG: 194 | Disposition: A | Discharge status: Home / Self Care | Payer: 59 | Attending: Internal Medicine | Admitting: Internal Medicine

## 2018-03-18 ENCOUNTER — Emergency Department: Payer: 59

## 2018-03-18 ENCOUNTER — Encounter: Payer: Self-pay | Admitting: Internal Medicine

## 2018-03-18 DIAGNOSIS — I2510 Atherosclerotic heart disease of native coronary artery without angina pectoris: Secondary | ICD-10-CM | POA: Diagnosis present

## 2018-03-18 DIAGNOSIS — Z8673 Personal history of transient ischemic attack (TIA), and cerebral infarction without residual deficits: Secondary | ICD-10-CM

## 2018-03-18 DIAGNOSIS — E11319 Type 2 diabetes mellitus with unspecified diabetic retinopathy without macular edema: Secondary | ICD-10-CM | POA: Diagnosis present

## 2018-03-18 DIAGNOSIS — E1165 Type 2 diabetes mellitus with hyperglycemia: Secondary | ICD-10-CM | POA: Diagnosis present

## 2018-03-18 DIAGNOSIS — E876 Hypokalemia: Secondary | ICD-10-CM | POA: Diagnosis present

## 2018-03-18 DIAGNOSIS — Z7902 Long term (current) use of antithrombotics/antiplatelets: Secondary | ICD-10-CM

## 2018-03-18 DIAGNOSIS — E1122 Type 2 diabetes mellitus with diabetic chronic kidney disease: Secondary | ICD-10-CM | POA: Diagnosis present

## 2018-03-18 DIAGNOSIS — I129 Hypertensive chronic kidney disease with stage 1 through stage 4 chronic kidney disease, or unspecified chronic kidney disease: Secondary | ICD-10-CM | POA: Diagnosis not present

## 2018-03-18 DIAGNOSIS — Z6837 Body mass index (BMI) 37.0-37.9, adult: Secondary | ICD-10-CM | POA: Diagnosis not present

## 2018-03-18 DIAGNOSIS — E669 Obesity, unspecified: Secondary | ICD-10-CM | POA: Diagnosis present

## 2018-03-18 DIAGNOSIS — Z794 Long term (current) use of insulin: Secondary | ICD-10-CM | POA: Diagnosis not present

## 2018-03-18 DIAGNOSIS — I252 Old myocardial infarction: Secondary | ICD-10-CM | POA: Diagnosis not present

## 2018-03-18 DIAGNOSIS — Z79899 Other long term (current) drug therapy: Secondary | ICD-10-CM

## 2018-03-18 DIAGNOSIS — J189 Pneumonia, unspecified organism: Secondary | ICD-10-CM | POA: Diagnosis not present

## 2018-03-18 DIAGNOSIS — J181 Lobar pneumonia, unspecified organism: Secondary | ICD-10-CM | POA: Diagnosis not present

## 2018-03-18 DIAGNOSIS — N183 Chronic kidney disease, stage 3 (moderate): Secondary | ICD-10-CM | POA: Diagnosis present

## 2018-03-18 DIAGNOSIS — D631 Anemia in chronic kidney disease: Secondary | ICD-10-CM | POA: Diagnosis present

## 2018-03-18 DIAGNOSIS — N179 Acute kidney failure, unspecified: Secondary | ICD-10-CM | POA: Diagnosis not present

## 2018-03-18 DIAGNOSIS — E871 Hypo-osmolality and hyponatremia: Secondary | ICD-10-CM | POA: Diagnosis not present

## 2018-03-18 DIAGNOSIS — R531 Weakness: Secondary | ICD-10-CM | POA: Diagnosis present

## 2018-03-18 DIAGNOSIS — Z7982 Long term (current) use of aspirin: Secondary | ICD-10-CM

## 2018-03-18 DIAGNOSIS — N189 Chronic kidney disease, unspecified: Secondary | ICD-10-CM

## 2018-03-18 DIAGNOSIS — E1140 Type 2 diabetes mellitus with diabetic neuropathy, unspecified: Secondary | ICD-10-CM | POA: Diagnosis present

## 2018-03-18 DIAGNOSIS — R110 Nausea: Secondary | ICD-10-CM | POA: Diagnosis not present

## 2018-03-18 DIAGNOSIS — N184 Chronic kidney disease, stage 4 (severe): Secondary | ICD-10-CM | POA: Diagnosis present

## 2018-03-18 DIAGNOSIS — E785 Hyperlipidemia, unspecified: Secondary | ICD-10-CM | POA: Diagnosis present

## 2018-03-18 DIAGNOSIS — E870 Hyperosmolality and hypernatremia: Secondary | ICD-10-CM

## 2018-03-18 DIAGNOSIS — R05 Cough: Secondary | ICD-10-CM | POA: Diagnosis not present

## 2018-03-18 DIAGNOSIS — H548 Legal blindness, as defined in USA: Secondary | ICD-10-CM | POA: Diagnosis present

## 2018-03-18 DIAGNOSIS — R739 Hyperglycemia, unspecified: Secondary | ICD-10-CM | POA: Diagnosis not present

## 2018-03-18 LAB — COMPREHENSIVE METABOLIC PANEL
ALBUMIN: 1.5 g/dL — AB (ref 3.5–5.0)
ALK PHOS: 24 U/L — AB (ref 38–126)
ALT: 33 U/L (ref 14–54)
AST: 48 U/L — AB (ref 15–41)
Anion gap: 8 (ref 5–15)
BUN: 47 mg/dL — AB (ref 6–20)
CALCIUM: 4.9 mg/dL — AB (ref 8.9–10.3)
CO2: 12 mmol/L — AB (ref 22–32)
CREATININE: 1.63 mg/dL — AB (ref 0.44–1.00)
Chloride: 117 mmol/L — ABNORMAL HIGH (ref 101–111)
GFR calc non Af Amer: 34 mL/min — ABNORMAL LOW (ref >60)
GFR, EST AFRICAN AMERICAN: 40 mL/min — AB (ref >60)
GLUCOSE: 308 mg/dL — AB (ref 65–99)
Potassium: 2.3 mmol/L — CL (ref 3.5–5.1)
SODIUM: 137 mmol/L (ref 135–145)
Total Bilirubin: 0.4 mg/dL (ref 0.3–1.2)
Total Protein: 4 g/dL — ABNORMAL LOW (ref 6.5–8.1)

## 2018-03-18 LAB — CBC WITH DIFFERENTIAL/PLATELET
BASOS PCT: 0 %
Basophils Absolute: 0 10*3/uL (ref 0–0.1)
EOS ABS: 0 10*3/uL (ref 0–0.7)
Eosinophils Relative: 0 %
HCT: 29.8 % — ABNORMAL LOW (ref 35.0–47.0)
Hemoglobin: 9.6 g/dL — ABNORMAL LOW (ref 12.0–16.0)
LYMPHS ABS: 1 10*3/uL (ref 1.0–3.6)
Lymphocytes Relative: 9 %
MCH: 27.8 pg (ref 26.0–34.0)
MCHC: 32.2 g/dL (ref 32.0–36.0)
MCV: 86.2 fL (ref 80.0–100.0)
MONO ABS: 1.3 10*3/uL — AB (ref 0.2–0.9)
MONOS PCT: 12 %
Neutro Abs: 8.3 10*3/uL — ABNORMAL HIGH (ref 1.4–6.5)
Neutrophils Relative %: 79 %
Platelets: 323 10*3/uL (ref 150–440)
RBC: 3.46 MIL/uL — ABNORMAL LOW (ref 3.80–5.20)
RDW: 16.5 % — AB (ref 11.5–14.5)
WBC: 10.6 10*3/uL (ref 3.6–11.0)

## 2018-03-18 LAB — INFLUENZA PANEL BY PCR (TYPE A & B)
INFLAPCR: NEGATIVE
INFLBPCR: NEGATIVE

## 2018-03-18 LAB — GLUCOSE, CAPILLARY
Glucose-Capillary: 456 mg/dL — ABNORMAL HIGH (ref 65–99)
Glucose-Capillary: 315 mg/dL — ABNORMAL HIGH (ref 65–99)

## 2018-03-18 MED ORDER — SODIUM CHLORIDE 0.9 % IV SOLN
1.0000 g | Freq: Once | INTRAVENOUS | Status: AC
  Administered 2018-03-18: 1 g via INTRAVENOUS
  Filled 2018-03-18: qty 10

## 2018-03-18 MED ORDER — BISACODYL 10 MG RE SUPP: 10.0000 mg | Freq: Every day | RECTAL | Status: DC | PRN

## 2018-03-18 MED ORDER — DOCUSATE SODIUM 100 MG PO CAPS
100.0000 mg | ORAL_CAPSULE | Freq: Two times a day (BID) | ORAL | Status: DC
  Administered 2018-03-18: 100 mg via ORAL
  Filled 2018-03-18 (×3): qty 1

## 2018-03-18 MED ORDER — ACETAMINOPHEN 325 MG PO TABS: 650.0000 mg | ORAL_TABLET | Freq: Four times a day (QID) | ORAL | Status: DC | PRN

## 2018-03-18 MED ORDER — POTASSIUM CHLORIDE 10 MEQ/100ML IV SOLN
10.0000 meq | Freq: Once | INTRAVENOUS | Status: AC
  Administered 2018-03-18: 10 meq via INTRAVENOUS
  Filled 2018-03-18: qty 100

## 2018-03-18 MED ORDER — TICAGRELOR 90 MG PO TABS
90.0000 mg | ORAL_TABLET | Freq: Two times a day (BID) | ORAL | Status: DC
  Administered 2018-03-18 – 2018-03-20 (×3): 90 mg via ORAL
  Filled 2018-03-18 (×5): qty 1

## 2018-03-18 MED ORDER — ONDANSETRON HCL 4 MG/2ML IJ SOLN: 4.0000 mg | Freq: Four times a day (QID) | INTRAMUSCULAR | Status: DC | PRN

## 2018-03-18 MED ORDER — ALLOPURINOL 100 MG PO TABS
200.0000 mg | ORAL_TABLET | Freq: Every day | ORAL | Status: DC
  Administered 2018-03-18 – 2018-03-19 (×2): 200 mg via ORAL
  Filled 2018-03-18 (×2): qty 2

## 2018-03-18 MED ORDER — ACETAMINOPHEN 650 MG RE SUPP: 650.0000 mg | Freq: Four times a day (QID) | RECTAL | Status: DC | PRN

## 2018-03-18 MED ORDER — POTASSIUM CHLORIDE IN NACL 40-0.9 MEQ/L-% IV SOLN
INTRAVENOUS | Status: DC
  Administered 2018-03-18 – 2018-03-19 (×2): 100 mL/h via INTRAVENOUS
  Filled 2018-03-18 (×6): qty 1000

## 2018-03-18 MED ORDER — AMLODIPINE BESYLATE 10 MG PO TABS
10.0000 mg | ORAL_TABLET | Freq: Every day | ORAL | Status: DC
  Administered 2018-03-18 – 2018-03-19 (×2): 10 mg via ORAL
  Filled 2018-03-18 (×2): qty 1

## 2018-03-18 MED ORDER — FUROSEMIDE 40 MG PO TABS
40.0000 mg | ORAL_TABLET | Freq: Every day | ORAL | Status: DC
  Administered 2018-03-19: 40 mg via ORAL
  Filled 2018-03-18: qty 1

## 2018-03-18 MED ORDER — INSULIN ASPART 100 UNIT/ML ~~LOC~~ SOLN
0.0000 [IU] | Freq: Three times a day (TID) | SUBCUTANEOUS | Status: DC
  Administered 2018-03-19: 5 [IU] via SUBCUTANEOUS
  Administered 2018-03-19: 9 [IU] via SUBCUTANEOUS
  Administered 2018-03-19: 5 [IU] via SUBCUTANEOUS
  Administered 2018-03-20: 7 [IU] via SUBCUTANEOUS
  Filled 2018-03-18 (×4): qty 1

## 2018-03-18 MED ORDER — LOSARTAN POTASSIUM 50 MG PO TABS
100.0000 mg | ORAL_TABLET | Freq: Every day | ORAL | Status: DC
  Administered 2018-03-20: 100 mg via ORAL
  Filled 2018-03-18 (×2): qty 2

## 2018-03-18 MED ORDER — AZITHROMYCIN 500 MG IV SOLR: 500.0000 mg | Freq: Once | INTRAVENOUS | Status: DC

## 2018-03-18 MED ORDER — SODIUM CHLORIDE 0.9 % IV SOLN
500.0000 mg | INTRAVENOUS | Status: DC
  Administered 2018-03-18 – 2018-03-19 (×2): 500 mg via INTRAVENOUS
  Filled 2018-03-18 (×3): qty 500

## 2018-03-18 MED ORDER — ATORVASTATIN CALCIUM 20 MG PO TABS
40.0000 mg | ORAL_TABLET | Freq: Every day | ORAL | Status: DC
  Administered 2018-03-18 – 2018-03-19 (×2): 40 mg via ORAL
  Filled 2018-03-18 (×2): qty 2

## 2018-03-18 MED ORDER — SODIUM CHLORIDE 0.9 % IV BOLUS
1000.0000 mL | Freq: Once | INTRAVENOUS | Status: AC
  Administered 2018-03-18: 1000 mL via INTRAVENOUS

## 2018-03-18 MED ORDER — IPRATROPIUM-ALBUTEROL 0.5-2.5 (3) MG/3ML IN SOLN
3.0000 mL | Freq: Four times a day (QID) | RESPIRATORY_TRACT | Status: DC
  Administered 2018-03-18 – 2018-03-20 (×6): 3 mL via RESPIRATORY_TRACT
  Filled 2018-03-18 (×5): qty 3

## 2018-03-18 MED ORDER — INSULIN ASPART 100 UNIT/ML ~~LOC~~ SOLN
8.0000 [IU] | Freq: Once | SUBCUTANEOUS | Status: AC
  Administered 2018-03-18: 8 [IU] via SUBCUTANEOUS
  Filled 2018-03-18: qty 1

## 2018-03-18 MED ORDER — INSULIN GLARGINE 100 UNIT/ML ~~LOC~~ SOLN
80.0000 [IU] | Freq: Every day | SUBCUTANEOUS | Status: DC
  Administered 2018-03-18 – 2018-03-19 (×2): 80 [IU] via SUBCUTANEOUS
  Filled 2018-03-18 (×3): qty 0.8

## 2018-03-18 MED ORDER — METOPROLOL TARTRATE 50 MG PO TABS
50.0000 mg | ORAL_TABLET | Freq: Two times a day (BID) | ORAL | Status: DC
  Administered 2018-03-18 – 2018-03-20 (×3): 50 mg via ORAL
  Filled 2018-03-18 (×4): qty 1

## 2018-03-18 MED ORDER — ONDANSETRON HCL 4 MG PO TABS: 4.0000 mg | ORAL_TABLET | Freq: Four times a day (QID) | ORAL | Status: DC | PRN

## 2018-03-18 MED ORDER — PANTOPRAZOLE SODIUM 40 MG IV SOLR
40.0000 mg | Freq: Two times a day (BID) | INTRAVENOUS | Status: DC
  Administered 2018-03-18 – 2018-03-20 (×3): 40 mg via INTRAVENOUS
  Filled 2018-03-18 (×4): qty 40

## 2018-03-18 MED ORDER — ASPIRIN EC 81 MG PO TBEC
81.0000 mg | DELAYED_RELEASE_TABLET | Freq: Every day | ORAL | Status: DC
  Administered 2018-03-20: 81 mg via ORAL
  Filled 2018-03-18 (×2): qty 1

## 2018-03-18 MED ORDER — HEPARIN SODIUM (PORCINE) 5000 UNIT/ML IJ SOLN
5000.0000 [IU] | Freq: Three times a day (TID) | INTRAMUSCULAR | Status: DC
  Administered 2018-03-18 – 2018-03-20 (×4): 5000 [IU] via SUBCUTANEOUS
  Filled 2018-03-18 (×4): qty 1

## 2018-03-18 NOTE — ED Triage Notes (Signed)
Pt arrived via EMS from home d/t flu symptoms x1 week. Pt reports no appetite x5 days. Pt is diabetic and reports last taking her insulin around 1500 today. EMS repots CBG of 480 upon arrival. Pt denies and pain or vomiting; just dry heaves. Pt is A&O x4 at this time.

## 2018-03-18 NOTE — ED Provider Notes (Signed)
[facility] Emergency Department Provider Note ____________________________________________   First MD Initiated Contact with Patient 03/18/18 1912     (approximate)  I have reviewed the triage vital signs and the nursing notes.   HISTORY  Chief Complaint Hyperglycemia    HPI Sharon Arias is a 57 y.o. female with history of diabetes on insulin who presents with generalized weakness, nonproductive cough, nausea and vomiting, and malaise for the last week, with decreased appetite over the last several days and persistent dry heaving.  Patient states she has not been able to eat anything, and has not been able to take any of her medications.  She reports that she has had resulting hyperglycemia to the 400s over the last 2 days.  She has felt increasingly weak but was concerned about taking insulin because she was not eating anything.  She reports multiple sick contacts in her family with the flu last week.   Past Medical History:  Diagnosis Date  . Anemia in chronic kidney disease 05/19/2016  . CAD (coronary artery disease)   . Chicken pox   . Chronic kidney disease    per dr sparks  . CVA (cerebral vascular accident) ([place])   . Detached retina   . Diabetes mellitus without complication ([place])    type 2  . Diabetic neuropathy ([place])   . Diabetic retinopathy ([place])    legally blind  . Hyperlipidemia   . Hypertension   . Myocardial infarction   . Obesity   . PONV (postoperative nausea and vomiting)   . Sciatica of right side    going to see physiciatry    Patient Active Problem List   Diagnosis Date Noted  . CKD (chronic kidney disease), stage IV ([place]) 09/29/2016  . Chronic arthritis 09/29/2016  . Anemia in chronic kidney disease 05/19/2016  . DDD (degenerative disc disease), lumbar 01/13/2016  . Well controlled type 2 diabetes mellitus ([place]) 10/01/2015  . Proliferative diabetic retinopathy with macular edema associated with type 2 diabetes mellitus  ([place]) 11/13/2014  . Coronary artery disease 08/17/2012  . Obesity 08/16/2012  . Myocardial infarction ([place]) 08/16/2012  . Hyperlipidemia, unspecified 08/16/2012  . HTN (hypertension) 08/16/2012    Past Surgical History:  Procedure Laterality Date  . COLONOSCOPY WITH PROPOFOL N/A 01/26/2016   Procedure: COLONOSCOPY WITH PROPOFOL;  Surgeon: Lollie Sails, MD;  Location: [facility] ENDOSCOPY;  Service: Endoscopy;  Laterality: N/A;  . COLONOSCOPY WITH PROPOFOL N/A 01/27/2016   Procedure: COLONOSCOPY WITH PROPOFOL;  Surgeon: Lollie Sails, MD;  Location: [facility] ENDOSCOPY;  Service: Endoscopy;  Laterality: N/A;  . CORONARY ARTERY BYPASS GRAFT  2013  . INCISION AND DRAINAGE     chest abscess  . RETINAL LASER PROCEDURE      Prior to Admission medications   Medication Sig Start Date End Date Taking? Authorizing Provider  allopurinol (ZYLOPRIM) 100 MG tablet Take by mouth. 02/11/17 02/11/18  [provider]  amLODipine (NORVASC) 10 MG tablet  05/20/15   [provider]  aspirin EC 81 MG tablet Take by mouth.    [provider]  atorvastatin (LIPITOR) 40 MG tablet Take by mouth. 05/05/15   [provider]  furosemide (LASIX) 40 MG tablet Take 1 tablet by mouth  daily 10/06/15   [provider]  Insulin Glargine (LANTUS SOLOSTAR) 100 UNIT/ML Solostar Pen Inject subcutaneously 74  units at bedtime ( Split  into 2 shots of 37 units  each at 2 different sites ) 07/18/15   [provider]  insulin lispro (HUMALOG KWIKPEN) 100 UNIT/ML KiwkPen Inject subcutaneously 22 units with breakfast,24 units with lunch,and 32 units with dinner, plus sliding scale as directed. 09/16/15   [provider]  losartan (COZAAR) 100 MG tablet  04/08/14   [provider]  metoprolol (LOPRESSOR) 50 MG tablet Take 1 tablet by mouth two  times daily 07/07/15   [provider]  ticagrelor (BRILINTA) 90 MG TABS tablet Take by mouth. 02/04/17   [provider]    Allergies Patient has no known allergies.  Family History  Problem Relation Age of Onset  . Breast cancer Mother   . Prostate cancer Father        we think mets to liver and bone    Social History Social History   Tobacco Use  . Smoking status: Never Smoker  . Smokeless tobacco: Never Used  Substance Use Topics  . Alcohol use: No  . Drug use: No    Review of Systems  Constitutional: Positive for generalized weakness and malaise. Eyes: No redness. ENT: No sore throat. Cardiovascular: Denies chest pain. Respiratory: Positive for cough.. Gastrointestinal: Positive for dry heaving.  Genitourinary: Negative for dysuria or frequency.  Musculoskeletal: Negative for back pain. Skin: Negative for rash. Neurological: Negative for headache.   ____________________________________________   PHYSICAL EXAM:  VITAL SIGNS: ED Triage Vitals  Enc Vitals Group     BP 03/18/18 1837 (!) 122/56     Pulse Rate 03/18/18 1837 67     Resp 03/18/18 1837 19     Temp 03/18/18 1837 98 F (36.7 C)     Temp Source 03/18/18 1837 Oral     SpO2 03/18/18 1837 98 %     Weight 03/18/18 1840 240 lb (108.9 kg)     Height 03/18/18 1840 5\' 8"  (1.727 m)     Head Circumference --      Peak Flow --      Pain Score 03/18/18 1839 0     Pain Loc --      Pain Edu? --      Excl. in [place]? --     Constitutional: Alert and oriented.  Uncomfortable and somewhat weak appearing but in no acute distress.  Eyes: Conjunctivae are normal.  Head: Atraumatic. Nose: No congestion/rhinnorhea. Mouth/Throat: Mucous membranes are dry.   Neck: Normal range of motion.  Cardiovascular: Normal rate, regular rhythm. Grossly normal heart sounds.  Good peripheral circulation. Respiratory: Normal respiratory effort.  No retractions.  Slightly decreased breath sounds bilaterally.   Gastrointestinal: Soft and nontender. No distention.  Genitourinary: No flank tenderness. Musculoskeletal: No lower  extremity edema.  Extremities warm and well perfused.  Neurologic:  Normal speech and language. No gross focal neurologic deficits are appreciated.  Skin:  Skin is warm and dry. No rash noted. Psychiatric: Mood and affect are normal. Speech and behavior are normal.  ____________________________________________   LABS (all labs ordered are listed, but only abnormal results are displayed)  Labs Reviewed  GLUCOSE, CAPILLARY - Abnormal; Notable for the following components:      Result Value   Glucose-Capillary 456 (*)    All other components within normal limits  COMPREHENSIVE METABOLIC PANEL - Abnormal; Notable for the following components:   Potassium 2.3 (*)    Chloride 117 (*)    CO2 12 (*)    Glucose, Bld 308 (*)    BUN 47 (*)    Creatinine, Ser 1.63 (*)    Calcium 4.9 (*)  Total Protein 4.0 (*)    Albumin 1.5 (*)    AST 48 (*)    Alkaline Phosphatase 24 (*)    GFR calc non Af Amer 34 (*)    GFR calc Af Amer 40 (*)    All other components within normal limits  CBC WITH DIFFERENTIAL/PLATELET - Abnormal; Notable for the following components:   RBC 3.46 (*)    Hemoglobin 9.6 (*)    HCT 29.8 (*)    RDW 16.5 (*)    Neutro Abs 8.3 (*)    Monocytes Absolute 1.3 (*)    All other components within normal limits  BLOOD GAS, VENOUS - Abnormal; Notable for the following components:   pCO2, Ven 39 (*)    All other components within normal limits  INFLUENZA PANEL BY PCR (TYPE A & B)  URINALYSIS, COMPLETE (UACMP) WITH MICROSCOPIC  CBG MONITORING, ED   ____________________________________________  EKG  ED ECG REPORT I, Arta Silence, the attending physician, personally viewed and interpreted this ECG.  Date: 03/18/2018 EKG Time: 2045 Rate: 62 Rhythm: normal sinus rhythm QRS Axis: normal Intervals: Nonspecific IVCD ST/T Wave abnormalities: normal Narrative Interpretation: no evidence of acute  ischemia  ____________________________________________  RADIOLOGY  CXR: Left lower lobe infiltrate  ____________________________________________   PROCEDURES  Procedure(s) performed: No  Procedures  Critical Care performed: No ____________________________________________   INITIAL IMPRESSION / ASSESSMENT AND PLAN / ED COURSE  Pertinent labs & imaging results that were available during my care of the patient were reviewed by me and considered in my medical decision making (see chart for details).  57 year old female with history of diabetes presents with generalized weakness, cough, vomiting, and flulike symptoms for the last several days, with decreased appetite and hyperglycemia.  Past medical records reviewed in epic; patient has not had any recent prior ED visits.  On exam, she is uncomfortable but relatively well-appearing, and the remainder the exam is as described above.  Overall differential includes influenza or other viral syndrome, viral bronchitis, pneumonia, or less likely other source of infection with likely resulting hyperglycemia due to not being able to take her medications.  I have a lower suspicion for DKA.  Plan: IV fluids, insulin, lab workup, chest x-ray, influenza swab, and reassess.    ----------------------------------------- 8:49 PM on 03/18/2018 -----------------------------------------  Patient's work-up reveals significant electrolyte abnormalities which are new including hypokalemia and hypocalcemia.  Her chest x-ray also shows an infiltrate which is likely precipitating her hyperglycemia and other symptoms.  We will give antibiotics for CAP, repletion of her electrolytes, and admit.  I signed the patient out to the hospitalist Dr. Doy Hutching.  ____________________________________________   FINAL CLINICAL IMPRESSION(S) / ED DIAGNOSES  Final diagnoses:  Hypokalemia  Hypocalcemia  Community acquired pneumonia of left lower lobe of lung ([place])   Hypernatremia      NEW MEDICATIONS STARTED DURING THIS VISIT:  New Prescriptions   No medications on file     Note:  This document was prepared using Dragon voice recognition software and may include unintentional dictation errors.    Arta Silence, MD 03/18/18 2050

## 2018-03-18 NOTE — H&P (Signed)
History and Physical    Sharon Arias ZDG:[id number] DOB: 26-Jul-1961 DOA: 03/18/2018  Referring physician: Dr. Cherylann Arias PCP: Sharon Crouch, MD  Specialists: none  Chief Complaint: fever with N/V  HPI: Sharon Arias is a 57 y.o. female has a past medical history significant for poorly controlled diabetes with CKD and anemia now with 1 week hx of fever, malaise, and N/V. In ER, pt noted to have LLL pneumonia with hyperglycemia and severe hypokalemia and hypocalcemia. She is now admitted. Denies Diarrhea. Some cough and SOB. No CP. She is not hypoxic  Review of Systems: The patient denies  weight loss,, vision loss, decreased hearing, hoarseness, chest pain, syncope, dyspnea on exertion, peripheral edema, balance deficits, hemoptysis, abdominal pain, melena, hematochezia, severe indigestion/heartburn, hematuria, incontinence, genital sores, muscle weakness, suspicious skin lesions, transient blindness, difficulty walking, depression, unusual weight change, abnormal bleeding, enlarged lymph nodes, angioedema, and breast masses.   Past Medical History:  Diagnosis Date  . Anemia in chronic kidney disease 05/19/2016  . CAD (coronary artery disease)   . Chicken pox   . Chronic kidney disease    per dr Sharon Arias  . CVA (cerebral vascular accident) ([place])   . Detached retina   . Diabetes mellitus without complication ([place])    type 2  . Diabetic neuropathy ([place])   . Diabetic retinopathy ([place])    legally blind  . Hyperlipidemia   . Hypertension   . Myocardial infarction ([place])   . Obesity   . PONV (postoperative nausea and vomiting)   . Sciatica of right side    going to see physiciatry   Past Surgical History:  Procedure Laterality Date  . COLONOSCOPY WITH PROPOFOL N/A 01/26/2016   Procedure: COLONOSCOPY WITH PROPOFOL;  Surgeon: Lollie Sails, MD;  Location: [facility] ENDOSCOPY;  Service: Endoscopy;  Laterality: N/A;  . COLONOSCOPY WITH PROPOFOL N/A 01/27/2016   Procedure: COLONOSCOPY WITH  PROPOFOL;  Surgeon: Lollie Sails, MD;  Location: [facility] ENDOSCOPY;  Service: Endoscopy;  Laterality: N/A;  . CORONARY ARTERY BYPASS GRAFT  2013  . INCISION AND DRAINAGE     chest abscess  . RETINAL LASER PROCEDURE     Social History:  reports that she has never smoked. She has never used smokeless tobacco. She reports that she does not drink alcohol or use drugs.  No Known Allergies  Family History  Problem Relation Age of Onset  . Breast cancer Mother   . Prostate cancer Father        we think mets to liver and bone    Prior to Admission medications   Medication Sig Start Date End Date Taking? Authorizing Provider  allopurinol (ZYLOPRIM) 100 MG tablet Take 200 mg by mouth at bedtime.  02/11/17 03/18/18 Yes [provider]  allopurinol (ZYLOPRIM) 300 MG tablet Take 300 mg by mouth daily.   Yes [provider]  amLODipine (NORVASC) 10 MG tablet Take 10 mg by mouth at bedtime.  05/20/15  Yes [provider]  aspirin EC 81 MG tablet Take 81 mg by mouth daily.    Yes [provider]  atorvastatin (LIPITOR) 40 MG tablet Take 40 mg by mouth at bedtime.  05/05/15  Yes [provider]  furosemide (LASIX) 40 MG tablet Take 1 tablet by mouth  daily 10/06/15  Yes [provider]  Insulin Glargine (LANTUS SOLOSTAR) 100 UNIT/ML Solostar Pen Inject subcutaneously 80  units at bedtime ( Split  into 2 shots of 40 units  each at 2 different  sites ) 07/18/15  Yes [provider]  insulin lispro (HUMALOG KWIKPEN) 100 UNIT/ML KiwkPen Inject subcutaneously 22 units with breakfast,24 units with lunch,and 32 units with dinner, plus sliding scale as directed. 09/16/15  Yes [provider]  losartan (COZAAR) 100 MG tablet Take 100 mg by mouth daily.  04/08/14  Yes [provider]  metoprolol (LOPRESSOR) 50 MG tablet Take 1 tablet by mouth two  times daily 07/07/15  Yes [provider]  ticagrelor (BRILINTA) 90 MG TABS tablet  Take 90 mg by mouth 2 (two) times daily.  02/04/17  Yes [provider]   Physical Exam: Vitals:   03/18/18 1840 03/18/18 1900 03/18/18 1930 03/18/18 2000  BP:  (!) 122/59 125/64 131/79  Pulse:  65 64 60  Resp:  15 13 16   Temp:      TempSrc:      SpO2:  97% 97% 93%  Weight: 108.9 kg (240 lb)     Height: 5\' 8"  (1.727 m)        General:  No apparent distress, WDWN, [place]/AT  Eyes: PERRL, EOMI, no scleral icterus, conjunctiva clear  ENT: moist oropharynx without exudate, TM's benign, dentition fair  Neck: supple, no lymphadenopathy  Cardiovascular: regular rate without MRG; 2+ peripheral pulses, no JVD, no peripheral edema  Respiratory: diffuse rhonchi with dullness at left base. No wheezes or rales. Respiratory effort increased.  Abdomen: soft, non tender to palpation, positive bowel sounds, no guarding, no rebound  Skin: no rashes or lesions  Musculoskeletal: normal bulk and tone, no joint swelling  Psychiatric: normal mood and affect, A&OX3  Neurologic: CN 2-12 grossly intact, Motor strength 5/5 in all 4 groups with symmetric DTR's and non-focal sensory exam  Labs on Admission:  Basic Metabolic Panel: Recent Labs  Lab 03/18/18 1911  NA 137  K 2.3*  CL 117*  CO2 12*  GLUCOSE 308*  BUN 47*  CREATININE 1.63*  CALCIUM 4.9*   Liver Function Tests: Recent Labs  Lab 03/18/18 1911  AST 48*  ALT 33  ALKPHOS 24*  BILITOT 0.4  PROT 4.0*  ALBUMIN 1.5*   No results for input(s): LIPASE, AMYLASE in the last 168 hours. No results for input(s): AMMONIA in the last 168 hours. CBC: Recent Labs  Lab 03/18/18 1911  WBC 10.6  NEUTROABS 8.3*  HGB 9.6*  HCT 29.8*  MCV 86.2  PLT 323   Cardiac Enzymes: No results for input(s): CKTOTAL, CKMB, CKMBINDEX, TROPONINI in the last 168 hours.  BNP (last 3 results) No results for input(s): BNP in the last 8760 hours.  ProBNP (last 3 results) No results for input(s): PROBNP in the last 8760 hours.  CBG: Recent  Labs  Lab 03/18/18 1847  GLUCAP 456*    Radiological Exams on Admission: Dg Chest 2 View  Result Date: 03/18/2018 CLINICAL DATA:  Cough.  Weakness. EXAM: CHEST - 2 VIEW COMPARISON:  12/17/2015 chest radiograph. FINDINGS: Motion degraded lateral view. Intact sternotomy wires. CABG clips overlie the mediastinum. Stable cardiomediastinal silhouette with mild cardiomegaly. No pneumothorax. No pleural effusion. No overt pulmonary edema. Hazy left lower lobe opacity. IMPRESSION: Hazy left lower lobe opacity, cannot exclude left lower lobe pneumonia. Recommend follow-up PA and lateral post treatment chest radiographs in 4-6 weeks. Stable mild cardiomegaly without pulmonary edema. Electronically Signed   By: Ilona Sorrel M.D.   On: 03/18/2018 20:36    EKG: Independently reviewed.  Assessment/Plan Principal Problem:   CAP (community acquired pneumonia) Active Problems:   Anemia in chronic  kidney disease   Hypocalcemia   Hypokalemia   Will admit to floor with IV fluids, IV ABX, and SVN's. Will correct electrolytes. Follow sugars. Repeat labs in AM.  Diet: clear liquids Fluids: NS with K+@100  DVT Prophylaxis: SQ Heparin  Code Status: FULL  Family Communication: yes  Disposition Plan: home  Time spent: 50 min

## 2018-03-19 LAB — COMPREHENSIVE METABOLIC PANEL
ALBUMIN: 2.4 g/dL — AB (ref 3.5–5.0)
ALK PHOS: 42 U/L (ref 38–126)
ALT: 51 U/L (ref 14–54)
ANION GAP: 12 (ref 5–15)
AST: 67 U/L — ABNORMAL HIGH (ref 15–41)
BUN: 73 mg/dL — ABNORMAL HIGH (ref 6–20)
CALCIUM: 8.1 mg/dL — AB (ref 8.9–10.3)
CO2: 20 mmol/L — ABNORMAL LOW (ref 22–32)
Chloride: 104 mmol/L (ref 101–111)
Creatinine, Ser: 2.53 mg/dL — ABNORMAL HIGH (ref 0.44–1.00)
GFR calc Af Amer: 23 mL/min — ABNORMAL LOW (ref >60)
GFR calc non Af Amer: 20 mL/min — ABNORMAL LOW (ref >60)
GLUCOSE: 276 mg/dL — AB (ref 65–99)
POTASSIUM: 4 mmol/L (ref 3.5–5.1)
SODIUM: 136 mmol/L (ref 135–145)
Total Bilirubin: 0.6 mg/dL (ref 0.3–1.2)
Total Protein: 6.4 g/dL — ABNORMAL LOW (ref 6.5–8.1)

## 2018-03-19 LAB — URINALYSIS, COMPLETE (UACMP) WITH MICROSCOPIC
BACTERIA UA: NONE SEEN
Bilirubin Urine: NEGATIVE
Glucose, UA: 150 mg/dL — AB
Ketones, ur: NEGATIVE mg/dL
Leukocytes, UA: NEGATIVE
Nitrite: NEGATIVE
Protein, ur: 30 mg/dL — AB
SPECIFIC GRAVITY, URINE: 1.01 (ref 1.005–1.030)
pH: 5 (ref 5.0–8.0)

## 2018-03-19 LAB — MAGNESIUM: Magnesium: 2.2 mg/dL (ref 1.7–2.4)

## 2018-03-19 LAB — CBC
HEMATOCRIT: 28.1 % — AB (ref 35.0–47.0)
HEMOGLOBIN: 9.3 g/dL — AB (ref 12.0–16.0)
MCH: 28.1 pg (ref 26.0–34.0)
MCHC: 33 g/dL (ref 32.0–36.0)
MCV: 85 fL (ref 80.0–100.0)
Platelets: 308 10*3/uL (ref 150–440)
RBC: 3.31 MIL/uL — ABNORMAL LOW (ref 3.80–5.20)
RDW: 16.5 % — ABNORMAL HIGH (ref 11.5–14.5)
WBC: 10.8 10*3/uL (ref 3.6–11.0)

## 2018-03-19 LAB — GLUCOSE, CAPILLARY
Glucose-Capillary: 289 mg/dL — ABNORMAL HIGH (ref 65–99)
Glucose-Capillary: 364 mg/dL — ABNORMAL HIGH (ref 65–99)
Glucose-Capillary: 413 mg/dL — ABNORMAL HIGH (ref 65–99)

## 2018-03-19 MED ORDER — MAGIC MOUTHWASH
5.0000 mL | Freq: Three times a day (TID) | ORAL | Status: DC
  Administered 2018-03-19 (×2): 5 mL via ORAL
  Filled 2018-03-19 (×5): qty 5

## 2018-03-19 MED ORDER — SODIUM CHLORIDE 0.9 % IV SOLN
1.0000 g | INTRAVENOUS | Status: DC
  Filled 2018-03-19: qty 10

## 2018-03-19 MED ORDER — SODIUM CHLORIDE 0.9 % IV SOLN
1.0000 g | INTRAVENOUS | Status: DC
  Administered 2018-03-20: 1 g via INTRAVENOUS
  Filled 2018-03-19 (×2): qty 10

## 2018-03-19 MED ORDER — GUAIFENESIN-DM 100-10 MG/5ML PO SYRP
5.0000 mL | ORAL_SOLUTION | ORAL | Status: DC | PRN
  Administered 2018-03-19: 5 mL via ORAL
  Filled 2018-03-19: qty 5

## 2018-03-19 NOTE — Therapy (Signed)
Pt brought in home CPAP.  Machine is in good working order except water chamber.  Water chamber was washed with soap and water and sterile was added to the reservoir.  Ready for use tonight.

## 2018-03-19 NOTE — Progress Notes (Signed)
[place] at [facility]                                                                                                                                                                                  Patient Demographics   Sharon Arias, is a 57 y.o. female, DOB - 1961/10/05, ION:[id number]  Admit date - 03/18/2018   Admitting Physician Idelle Crouch, MD  Outpatient Primary MD for the patient is Idelle Crouch, MD   LOS - 1  Subjective: Patient admitted with generalized weakness nausea vomiting She states that she has not been able to eat in a while. Complains of dry cough    Review of Systems:   CONSTITUTIONAL: No documented fever.  Positive fatigue, positive weakness. No weight gain, no weight loss.  EYES: No blurry or double vision.  ENT: No tinnitus. No postnasal drip. No redness of the oropharynx.  RESPIRATORY: Dry cough, no wheeze, no hemoptysis.  Positive dyspnea.  CARDIOVASCULAR: No chest pain. No orthopnea. No palpitations. No syncope.  GASTROINTESTINAL: No nausea, no vomiting or diarrhea. No abdominal pain. No melena or hematochezia.  GENITOURINARY: No dysuria or hematuria.  ENDOCRINE: No polyuria or nocturia. No heat or cold intolerance.  HEMATOLOGY: No anemia. No bruising. No bleeding.  INTEGUMENTARY: No rashes. No lesions.  MUSCULOSKELETAL: No arthritis. No swelling. No gout.  NEUROLOGIC: No numbness, tingling, or ataxia. No seizure-type activity.  PSYCHIATRIC: No anxiety. No insomnia. No ADD.    Vitals:   Vitals:   03/19/18 0500 03/19/18 0806 03/19/18 1208 03/19/18 1306  BP:    (!) 136/53  Pulse:    79  Resp:    12  Temp:    97.6 F (36.4 C)  TempSrc:    Oral  SpO2:  95% 95% 99%  Weight: 244 lb 8 oz (110.9 kg)     Height:        Wt Readings from Last 3 Encounters:  03/19/18 244 lb 8 oz (110.9 kg)  03/14/17 257 lb 3.2 oz (116.7 kg)  09/29/16 254 lb 1.3 oz (115.2 kg)     Intake/Output Summary (Last 24 hours) at  03/19/2018 1432 Last data filed at 03/19/2018 1355 Gross per 24 hour  Intake 4372 ml  Output 2400 ml  Net 1972 ml    Physical Exam:   GENERAL: Pleasant-appearing in no apparent distress.  HEAD, EYES, EARS, NOSE AND THROAT: Atraumatic, normocephalic. Extraocular muscles are intact. Pupils equal and reactive to light. Sclerae anicteric. No conjunctival injection. No oro-pharyngeal erythema.  NECK: Supple. There is no jugular venous distention. No bruits, no lymphadenopathy, no thyromegaly.  HEART: Regular rate and rhythm,. No murmurs, no rubs, no  clicks.  LUNGS: Clear to auscultation bilaterally. No rales or rhonchi. No wheezes.  ABDOMEN: Soft, flat, nontender, nondistended. Has good bowel sounds. No hepatosplenomegaly appreciated.  EXTREMITIES: No evidence of any cyanosis, clubbing, or peripheral edema.  +2 pedal and radial pulses bilaterally.  NEUROLOGIC: The patient is alert, awake, and oriented x3 with no focal motor or sensory deficits appreciated bilaterally.  SKIN: Moist and warm with no rashes appreciated.  Psych: Not anxious, depressed LN: No inguinal LN enlargement    Antibiotics   Anti-infectives (From admission, onward)   Start     Dose/Rate Route Frequency Ordered Stop   03/18/18 2115  azithromycin (ZITHROMAX) 500 mg in sodium chloride 0.9 % 250 mL IVPB     500 mg 250 mL/hr over 60 Minutes Intravenous Every 24 hours 03/18/18 2109     03/18/18 2100  cefTRIAXone (ROCEPHIN) 1 g in sodium chloride 0.9 % 100 mL IVPB     1 g 200 mL/hr over 30 Minutes Intravenous  Once 03/18/18 2048 03/18/18 2338   03/18/18 2100  azithromycin (ZITHROMAX) 500 mg in sodium chloride 0.9 % 250 mL IVPB  Status:  Discontinued     500 mg 250 mL/hr over 60 Minutes Intravenous  Once 03/18/18 2048 03/18/18 2109      Medications   Scheduled Meds: . allopurinol  200 mg Oral QHS  . amLODipine  10 mg Oral QHS  . aspirin EC  81 mg Oral Daily  . atorvastatin  40 mg Oral QHS  . docusate sodium  100 mg  Oral BID  . heparin  5,000 Units Subcutaneous Q8H  . insulin aspart  0-9 Units Subcutaneous TID WC  . insulin glargine  80 Units Subcutaneous Q2200  . ipratropium-albuterol  3 mL Nebulization QID  . losartan  100 mg Oral Daily  . magic mouthwash  5 mL Oral TID  . metoprolol tartrate  50 mg Oral BID  . pantoprazole (PROTONIX) IV  40 mg Intravenous Q12H  . ticagrelor  90 mg Oral BID   Continuous Infusions: . 0.9 % NaCl with KCl 40 mEq / L 100 mL/hr (03/19/18 0851)  . azithromycin Stopped (03/19/18 0030)   PRN Meds:.acetaminophen **OR** acetaminophen, bisacodyl, guaiFENesin-dextromethorphan, ondansetron **OR** ondansetron (ZOFRAN) IV   Data Review:   Micro Results No results found for this or any previous visit (from the past 240 hour(s)).  Radiology Reports Dg Chest 2 View  Result Date: 03/18/2018 CLINICAL DATA:  Cough.  Weakness. EXAM: CHEST - 2 VIEW COMPARISON:  12/17/2015 chest radiograph. FINDINGS: Motion degraded lateral view. Intact sternotomy wires. CABG clips overlie the mediastinum. Stable cardiomediastinal silhouette with mild cardiomegaly. No pneumothorax. No pleural effusion. No overt pulmonary edema. Hazy left lower lobe opacity. IMPRESSION: Hazy left lower lobe opacity, cannot exclude left lower lobe pneumonia. Recommend follow-up PA and lateral post treatment chest radiographs in 4-6 weeks. Stable mild cardiomegaly without pulmonary edema. Electronically Signed   By: Ilona Sorrel M.D.   On: 03/18/2018 20:36     CBC Recent Labs  Lab 03/18/18 1911 03/19/18 0434  WBC 10.6 10.8  HGB 9.6* 9.3*  HCT 29.8* 28.1*  PLT 323 308  MCV 86.2 85.0  MCH 27.8 28.1  MCHC 32.2 33.0  RDW 16.5* 16.5*  LYMPHSABS 1.0  --   MONOABS 1.3*  --   EOSABS 0.0  --   BASOSABS 0.0  --     Chemistries  Recent Labs  Lab 03/18/18 1911 03/19/18 0434  NA 137 136  K 2.3* 4.0  CL  117* 104  CO2 12* 20*  GLUCOSE 308* 276*  BUN 47* 73*  CREATININE 1.63* 2.53*  CALCIUM 4.9* 8.1*  MG  --   2.2  AST 48* 67*  ALT 33 51  ALKPHOS 24* 42  BILITOT 0.4 0.6   ------------------------------------------------------------------------------------------------------------------ estimated creatinine clearance is 32.4 mL/min (A) (by C-G formula based on SCr of 2.53 mg/dL (H)). ------------------------------------------------------------------------------------------------------------------ No results for input(s): HGBA1C in the last 72 hours. ------------------------------------------------------------------------------------------------------------------ No results for input(s): CHOL, HDL, LDLCALC, TRIG, CHOLHDL, LDLDIRECT in the last 72 hours. ------------------------------------------------------------------------------------------------------------------ No results for input(s): TSH, T4TOTAL, T3FREE, THYROIDAB in the last 72 hours.  Invalid input(s): FREET3 ------------------------------------------------------------------------------------------------------------------ No results for input(s): VITAMINB12, FOLATE, FERRITIN, TIBC, IRON, RETICCTPCT in the last 72 hours.  Coagulation profile No results for input(s): INR, PROTIME in the last 168 hours.  No results for input(s): DDIMER in the last 72 hours.  Cardiac Enzymes No results for input(s): CKMB, TROPONINI, MYOGLOBIN in the last 168 hours.  Invalid input(s): CK ------------------------------------------------------------------------------------------------------------------ Invalid input(s): [place]   Patient is a 57 year old white female presenting with pneumonia  1.  Community-acquired pneumonia continue azithromycin resume ceftriaxone  2.  Acute renal failure on chronic kidney disease stage III Give IV fluids Renal function worse today Her Lasix was continued I will discontinue this  3.  Nausea vomiting likely related to pneumonia  4.  Hypocalcemia hypokalemia improved with replacement    5.  Essential hypertension continue Norvasc and metoprolol  6.  Diabetes type 2 continue sliding scale insulin and Lantus  7.  Coronary artery disease continue Brilinta and metoprolol     Code Status Orders  (From admission, onward)        Start     Ordered   03/18/18 2245  Full code  Continuous     03/18/18 2244    Code Status History    This patient has a current code status but no historical code status.           Consults none DVT Prophylaxis  Lovenox  Lab Results  Component Value Date   PLT 308 03/19/2018     Time Spent in minutes   35 minutes greater than 50% of time spent in care coordination and counseling patient regarding the condition and plan of care.   Dustin Flock M.D on 03/19/2018 at 2:32 PM  Between 7am to 6pm - Pager - [phone]  After 6pm go to [email or web address] - Proofreader  [employer]   Office  [phone]

## 2018-03-20 LAB — BASIC METABOLIC PANEL
ANION GAP: 12 (ref 5–15)
BUN: 58 mg/dL — ABNORMAL HIGH (ref 6–20)
CO2: 18 mmol/L — ABNORMAL LOW (ref 22–32)
Calcium: 8.4 mg/dL — ABNORMAL LOW (ref 8.9–10.3)
Chloride: 108 mmol/L (ref 101–111)
Creatinine, Ser: 2.08 mg/dL — ABNORMAL HIGH (ref 0.44–1.00)
GFR, EST AFRICAN AMERICAN: 30 mL/min — AB (ref >60)
GFR, EST NON AFRICAN AMERICAN: 25 mL/min — AB (ref >60)
Glucose, Bld: 388 mg/dL — ABNORMAL HIGH (ref 65–99)
POTASSIUM: 4.2 mmol/L (ref 3.5–5.1)
SODIUM: 138 mmol/L (ref 135–145)

## 2018-03-20 LAB — GLUCOSE, CAPILLARY
GLUCOSE-CAPILLARY: 249 mg/dL — AB (ref 65–99)
GLUCOSE-CAPILLARY: 339 mg/dL — AB (ref 65–99)
GLUCOSE-CAPILLARY: 324 mg/dL — AB (ref 65–99)
Glucose-Capillary: 310 mg/dL — ABNORMAL HIGH (ref 65–99)

## 2018-03-20 MED ORDER — INSULIN ASPART 100 UNIT/ML ~~LOC~~ SOLN
0.0000 [IU] | Freq: Three times a day (TID) | SUBCUTANEOUS | Status: DC
  Administered 2018-03-20: 11 [IU] via SUBCUTANEOUS
  Filled 2018-03-20: qty 1

## 2018-03-20 MED ORDER — INSULIN ASPART 100 UNIT/ML ~~LOC~~ SOLN: 0.0000 [IU] | Freq: Every day | SUBCUTANEOUS | Status: DC

## 2018-03-20 MED ORDER — GUAIFENESIN-DM 100-10 MG/5ML PO SYRP: 5.0000 mL | ORAL_SOLUTION | ORAL | 0 refills | Status: DC | PRN

## 2018-03-20 MED ORDER — LEVOFLOXACIN 750 MG PO TABS
750.0000 mg | ORAL_TABLET | ORAL | Status: DC
  Filled 2018-03-20: qty 1

## 2018-03-20 MED ORDER — INSULIN ASPART 100 UNIT/ML ~~LOC~~ SOLN: 6.0000 [IU] | Freq: Three times a day (TID) | SUBCUTANEOUS | Status: DC

## 2018-03-20 MED ORDER — INSULIN ASPART 100 UNIT/ML ~~LOC~~ SOLN
12.0000 [IU] | Freq: Three times a day (TID) | SUBCUTANEOUS | Status: DC
  Administered 2018-03-20: 12 [IU] via SUBCUTANEOUS
  Filled 2018-03-20: qty 1

## 2018-03-20 MED ORDER — IPRATROPIUM-ALBUTEROL 0.5-2.5 (3) MG/3ML IN SOLN: 3.0000 mL | RESPIRATORY_TRACT | Status: DC | PRN

## 2018-03-20 MED ORDER — LEVOFLOXACIN 750 MG PO TABS: 750.0000 mg | ORAL_TABLET | ORAL | 0 refills | Status: DC

## 2018-03-20 NOTE — Discharge Instructions (Signed)
Heart healthy and ADA diet. °

## 2018-03-20 NOTE — Consult Note (Addendum)
Pharmacy Antibiotic Note  Sharon Arias is a 57 y.o. female admitted on 03/18/2018 with pneumonia.  Pharmacy has been consulted for Levofloxacin dosing. Patient received azithromycin 500mg  and Ceftriaxone 1g IV  The past two days at around 2100.   Plan: Will start levofloxacin 750mg  PO every 48 hours based on current CrCl 20-68mL/min. First dose to be given this evening approximally 24 hours after azithromycin dose due to increased risk of QTc prolongation.   Height: 5\' 8"  (172.7 cm) Weight: 244 lb 0.8 oz (110.7 kg) IBW/kg (Calculated) : 63.9  Temp (24hrs), Avg:97.6 F (36.4 C), Min:97.4 F (36.3 C), Max:97.7 F (36.5 C)  Recent Labs  Lab 03/18/18 1911 03/19/18 0434 03/20/18 0351  WBC 10.6 10.8  --   CREATININE 1.63* 2.53* 2.08*    Estimated Creatinine Clearance: 39.4 mL/min (A) (by C-G formula based on SCr of 2.08 mg/dL (H)).    No Known Allergies  Antimicrobials this admission: 3/30 Azithro/Ceftriaxone  >> 4/1 4/1 Levofloxacin >>   Thank you for allowing pharmacy to be a part of this patient's care.  Pernell Dupre, PharmD, BCPS Clinical Pharmacist 03/20/2018 10:14 AM

## 2018-03-20 NOTE — Progress Notes (Signed)
IV was removed. Discharge instructions, follow-up appointments, and prescriptions were provided to the pt. All questions answered. The pt was taken downstairs via wheelchair by volunteers.  

## 2018-03-20 NOTE — Progress Notes (Addendum)
Inpatient Diabetes Program Recommendations  AACE/ADA: New Consensus Statement on Inpatient Glycemic Control (2015)  Target Ranges:  Prepandial:   less than 140 mg/dL      Peak postprandial:   less than 180 mg/dL (1-2 hours)      Critically ill patients:  140 - 180 mg/dL   Results for Sharon Arias, Sharon Arias (MRN [id number]) as of 03/20/2018 08:35  Ref. Range 03/19/2018 07:39 03/19/2018 11:35 03/19/2018 16:34 03/19/2018 21:38  Glucose-Capillary Latest Ref Range: 65 - 99 mg/dL 249 (H)  5 units NOVOLOG  289 (H)  5 units NOVOLOG  364 (H)  9 units NOVOLOG  413 (H)  80 units LANTUS   Results for Sharon Arias, Sharon Arias (MRN [id number]) as of 03/20/2018 08:35  Ref. Range 03/20/2018 07:21  Glucose-Capillary Latest Ref Range: 65 - 99 mg/dL 339 (H)  7 units NOVOLOG      Home DM Meds: Lantus 80 units QHS        Humalog 22 units with Breakfast/ 24 units with Lunch/ 32 units with  Dinner              Humalog SSI  Current Orders: Lantus 80 units QHS       Novolog Moderate Correction Scale/ SSI (0-15 units) TID AC + HS      Novolog 6 units TID with meals   Endocrinologist: Abouassi, Lennie Hummer, MD [facility])      MD- Note Novolog SSI increased this AM to the Moderate scale and also note that Novolog Meal Coverage to start at 12pm today.  Patient takes much larger doses of rapid-acting insulin at home.  Please consider further increasing Novolog Meal Coverage to: Novolog 12 units TID with meals (hold if pt eats <50% of meal)  [This would be about 50% home doses of meal time insulin]     --Will follow patient during hospitalization--  Wyn Quaker RN, MSN, CDE Diabetes Coordinator Inpatient Glycemic Control Team Team Pager: [phone] (8a-5p)

## 2018-03-20 NOTE — Discharge Summary (Signed)
[place] [facility] [place] NAME: Sharon Arias    MR#:  [id number]  DATE OF BIRTH:  Apr 17, 1961  DATE OF ADMISSION:  03/18/2018   ADMITTING PHYSICIAN: Idelle Crouch, MD  DATE OF DISCHARGE: 03/20/2018  2:40 PM  PRIMARY CARE PHYSICIAN: Idelle Crouch, MD   ADMISSION DIAGNOSIS:  Hypocalcemia [E83.51] Hypokalemia [E87.6] Hypernatremia [E87.0] Community acquired pneumonia of left lower lobe of lung ([place]) [J18.1] DISCHARGE DIAGNOSIS:  Principal Problem:   CAP (community acquired pneumonia) Active Problems:   Anemia in chronic kidney disease   Hypocalcemia   Hypokalemia  SECONDARY DIAGNOSIS:   Past Medical History:  Diagnosis Date  . Anemia in chronic kidney disease 05/19/2016  . CAD (coronary artery disease)   . Chicken pox   . Chronic kidney disease    per dr sparks  . CVA (cerebral vascular accident) ([place])   . Detached retina   . Diabetes mellitus without complication ([place])    type 2  . Diabetic neuropathy ([place])   . Diabetic retinopathy ([place])    legally blind  . Hyperlipidemia   . Hypertension   . Myocardial infarction ([place])   . Obesity   . PONV (postoperative nausea and vomiting)   . Sciatica of right side    going to see physiciatry   HOSPITAL COURSE:   Patient is a 57 year old white female presenting with pneumonia  1.  Community-acquired pneumonia, Patient has been treated with azithromycin and ceftriaxone, changed to Levaquin every 48 hours for 3 more doses.  2.  Acute renal failure on chronic kidney disease stage III Improving with IV fluids. Her Lasix was hold, may resume after discharge.  follow-up with Dr. Juleen China as outpatient.  3.  Nausea vomiting likely related to pneumonia, improved.  4.  Hypocalcemia hypokalemia improved with replacement   5.  Essential hypertension continue Norvasc and metoprolol  6.  Diabetes type 2, she has been treated with NovoLog before meals, sliding scale insulin and  Lantus.  Continue home Lantus dose and Humalog AC.  7.  Coronary artery disease continue Brilinta and metoprolol  DISCHARGE CONDITIONS:  Stable, discharged to home today. CONSULTS OBTAINED:   DRUG ALLERGIES:  No Known Allergies DISCHARGE MEDICATIONS:   Allergies as of 03/20/2018   No Known Allergies     Medication List    TAKE these medications   ZYLOPRIM 300 MG tablet Generic drug:  allopurinol Take 300 mg by mouth daily.   allopurinol 100 MG tablet Commonly known as:  ZYLOPRIM Take 200 mg by mouth at bedtime.   amLODipine 10 MG tablet Commonly known as:  NORVASC Take 10 mg by mouth at bedtime.   aspirin EC 81 MG tablet Take 81 mg by mouth daily.   atorvastatin 40 MG tablet Commonly known as:  LIPITOR Take 40 mg by mouth at bedtime.   furosemide 40 MG tablet Commonly known as:  LASIX Take 1 tablet by mouth  daily   guaiFENesin-dextromethorphan 100-10 MG/5ML syrup Commonly known as:  ROBITUSSIN DM Take 5 mLs by mouth every 4 (four) hours as needed for cough.   HUMALOG KWIKPEN 100 UNIT/ML KiwkPen Generic drug:  insulin lispro Inject subcutaneously 22 units with breakfast,24 units with lunch,and 32 units with dinner, plus sliding scale as directed.   LANTUS SOLOSTAR 100 UNIT/ML Solostar Pen Generic drug:  Insulin Glargine Inject subcutaneously 80  units at bedtime ( Split  into 2 shots of 40 units  each at 2 different sites )  levofloxacin 750 MG tablet Commonly known as:  LEVAQUIN Take 1 tablet (750 mg total) by mouth every other day.   losartan 100 MG tablet Commonly known as:  COZAAR Take 100 mg by mouth daily.   metoprolol tartrate 50 MG tablet Commonly known as:  LOPRESSOR Take 1 tablet by mouth two  times daily   ticagrelor 90 MG Tabs tablet Commonly known as:  BRILINTA Take 90 mg by mouth 2 (two) times daily.        DISCHARGE INSTRUCTIONS:  See AVS.  If you experience worsening of your admission symptoms, develop shortness of breath,  life threatening emergency, suicidal or homicidal thoughts you must seek medical attention immediately by calling 911 or calling your MD immediately  if symptoms less severe.  You Must read complete instructions/literature along with all the possible adverse reactions/side effects for all the Medicines you take and that have been prescribed to you. Take any new Medicines after you have completely understood and accpet all the possible adverse reactions/side effects.   Please note  You were cared for by a hospitalist during your hospital stay. If you have any questions about your discharge medications or the care you received while you were in the hospital after you are discharged, you can call the unit and asked to speak with the hospitalist on call if the hospitalist that took care of you is not available. Once you are discharged, your primary care physician will handle any further medical issues. Please note that NO REFILLS for any discharge medications will be authorized once you are discharged, as it is imperative that you return to your primary care physician (or establish a relationship with a primary care physician if you do not have one) for your aftercare needs so that they can reassess your need for medications and monitor your lab values.    On the day of Discharge:  VITAL SIGNS:  Blood pressure (!) 142/62, pulse 75, temperature 97.7 F (36.5 C), temperature source Oral, resp. rate 18, height 5\' 8"  (1.727 m), weight 244 lb 0.8 oz (110.7 kg), SpO2 98 %. PHYSICAL EXAMINATION:  GENERAL:  57 y.o.-year-old patient lying in the bed with no acute distress.  Obesity. EYES: Pupils equal, round, reactive to light and accommodation. No scleral icterus. Extraocular muscles intact.  HEENT: Head atraumatic, normocephalic. Oropharynx and nasopharynx clear.  NECK:  Supple, no jugular venous distention. No thyroid enlargement, no tenderness.  LUNGS: Normal breath sounds bilaterally, no wheezing,  rales,rhonchi or crepitation. No use of accessory muscles of respiration.  CARDIOVASCULAR: S1, S2 normal. No murmurs, rubs, or gallops.  ABDOMEN: Soft, non-tender, non-distended. Bowel sounds present. No organomegaly or mass.  EXTREMITIES: No pedal edema, cyanosis, or clubbing.  NEUROLOGIC: Cranial nerves II through XII are intact. Muscle strength 5/5 in all extremities. Sensation intact. Gait not checked.  PSYCHIATRIC: The patient is alert and oriented x 3.  SKIN: No obvious rash, lesion, or ulcer.  DATA REVIEW:   CBC Recent Labs  Lab 03/19/18 0434  WBC 10.8  HGB 9.3*  HCT 28.1*  PLT 308    Chemistries  Recent Labs  Lab 03/19/18 0434 03/20/18 0351  NA 136 138  K 4.0 4.2  CL 104 108  CO2 20* 18*  GLUCOSE 276* 388*  BUN 73* 58*  CREATININE 2.53* 2.08*  CALCIUM 8.1* 8.4*  MG 2.2  --   AST 67*  --   ALT 51  --   ALKPHOS 42  --   BILITOT 0.6  --  Microbiology Results  No results found for this or any previous visit.  RADIOLOGY:  No results found.   Management plans discussed with the patient, family and they are in agreement.  CODE STATUS: Full Code   TOTAL TIME TAKING CARE OF THIS PATIENT: 33 minutes.    Demetrios Loll M.D on 03/20/2018 at 3:50 PM  Between 7am to 6pm - Pager - [phone]  After 6pm go to [email or web address] - Proofreader  [facility]  Office  [phone]  CC: Primary care physician; Idelle Crouch, MD   Note: This dictation was prepared with Dragon dictation along with smaller phrase technology. Any transcriptional errors that result from this process are unintentional.

## 2018-03-21 LAB — BLOOD GAS, VENOUS
Acid-base deficit: 1.8 mmol/L (ref 0.0–2.0)
Bicarbonate: 23.1 mmol/L (ref 20.0–28.0)
PCO2 VEN: 39 mmHg — AB (ref 44.0–60.0)
PH VEN: 7.38 (ref 7.250–7.430)
Patient temperature: 37

## 2018-03-21 LAB — CALCIUM, IONIZED: Calcium, Ionized, Serum: 4.6 mg/dL (ref 4.5–5.6)

## 2018-03-24 DIAGNOSIS — J181 Lobar pneumonia, unspecified organism: Secondary | ICD-10-CM | POA: Diagnosis not present

## 2018-03-24 DIAGNOSIS — N184 Chronic kidney disease, stage 4 (severe): Secondary | ICD-10-CM | POA: Diagnosis not present

## 2018-03-24 DIAGNOSIS — D631 Anemia in chronic kidney disease: Secondary | ICD-10-CM | POA: Diagnosis not present

## 2018-03-24 DIAGNOSIS — J189 Pneumonia, unspecified organism: Secondary | ICD-10-CM | POA: Diagnosis not present

## 2018-04-05 DIAGNOSIS — G4733 Obstructive sleep apnea (adult) (pediatric): Secondary | ICD-10-CM | POA: Diagnosis not present

## 2018-04-06 DIAGNOSIS — E1122 Type 2 diabetes mellitus with diabetic chronic kidney disease: Secondary | ICD-10-CM | POA: Diagnosis not present

## 2018-04-06 DIAGNOSIS — E7800 Pure hypercholesterolemia, unspecified: Secondary | ICD-10-CM | POA: Diagnosis not present

## 2018-04-06 DIAGNOSIS — I10 Essential (primary) hypertension: Secondary | ICD-10-CM | POA: Diagnosis not present

## 2018-04-10 DIAGNOSIS — J189 Pneumonia, unspecified organism: Secondary | ICD-10-CM | POA: Diagnosis not present

## 2018-04-19 DIAGNOSIS — E1142 Type 2 diabetes mellitus with diabetic polyneuropathy: Secondary | ICD-10-CM | POA: Diagnosis not present

## 2018-04-19 DIAGNOSIS — Z794 Long term (current) use of insulin: Secondary | ICD-10-CM | POA: Diagnosis not present

## 2018-04-20 ENCOUNTER — Other Ambulatory Visit: Payer: Self-pay | Admitting: Internal Medicine

## 2018-04-20 DIAGNOSIS — E782 Mixed hyperlipidemia: Secondary | ICD-10-CM | POA: Diagnosis not present

## 2018-04-20 DIAGNOSIS — Z79899 Other long term (current) drug therapy: Secondary | ICD-10-CM | POA: Diagnosis not present

## 2018-04-20 DIAGNOSIS — N189 Chronic kidney disease, unspecified: Secondary | ICD-10-CM | POA: Diagnosis not present

## 2018-04-20 DIAGNOSIS — N183 Chronic kidney disease, stage 3 (moderate): Secondary | ICD-10-CM | POA: Diagnosis not present

## 2018-04-20 DIAGNOSIS — R9389 Abnormal findings on diagnostic imaging of other specified body structures: Secondary | ICD-10-CM

## 2018-04-21 DIAGNOSIS — R8290 Unspecified abnormal findings in urine: Secondary | ICD-10-CM | POA: Diagnosis not present

## 2018-04-26 ENCOUNTER — Other Ambulatory Visit: Payer: Self-pay | Admitting: Internal Medicine

## 2018-04-26 DIAGNOSIS — Z1231 Encounter for screening mammogram for malignant neoplasm of breast: Secondary | ICD-10-CM

## 2018-04-28 ENCOUNTER — Ambulatory Visit
Admission: RE | Admit: 2018-04-28 | Discharge: 2018-04-28 | Disposition: A | Discharge status: Home / Self Care | Payer: 59 | Source: Ambulatory Visit | Attending: Internal Medicine | Admitting: Internal Medicine

## 2018-04-28 DIAGNOSIS — J180 Bronchopneumonia, unspecified organism: Secondary | ICD-10-CM | POA: Insufficient documentation

## 2018-04-28 DIAGNOSIS — Z951 Presence of aortocoronary bypass graft: Secondary | ICD-10-CM | POA: Insufficient documentation

## 2018-04-28 DIAGNOSIS — R9389 Abnormal findings on diagnostic imaging of other specified body structures: Secondary | ICD-10-CM

## 2018-04-28 DIAGNOSIS — N184 Chronic kidney disease, stage 4 (severe): Secondary | ICD-10-CM | POA: Diagnosis not present

## 2018-04-28 DIAGNOSIS — I129 Hypertensive chronic kidney disease with stage 1 through stage 4 chronic kidney disease, or unspecified chronic kidney disease: Secondary | ICD-10-CM | POA: Diagnosis not present

## 2018-04-28 DIAGNOSIS — E1122 Type 2 diabetes mellitus with diabetic chronic kidney disease: Secondary | ICD-10-CM | POA: Diagnosis not present

## 2018-05-11 ENCOUNTER — Ambulatory Visit
Admission: RE | Admit: 2018-05-11 | Discharge: 2018-05-11 | Disposition: A | Discharge status: Home / Self Care | Payer: 59 | Source: Ambulatory Visit | Attending: Internal Medicine | Admitting: Internal Medicine

## 2018-05-11 DIAGNOSIS — Z1231 Encounter for screening mammogram for malignant neoplasm of breast: Secondary | ICD-10-CM | POA: Diagnosis present

## 2018-05-24 DIAGNOSIS — I214 Non-ST elevation (NSTEMI) myocardial infarction: Secondary | ICD-10-CM | POA: Diagnosis not present

## 2018-05-24 DIAGNOSIS — I25810 Atherosclerosis of coronary artery bypass graft(s) without angina pectoris: Secondary | ICD-10-CM | POA: Diagnosis not present

## 2018-05-24 DIAGNOSIS — I10 Essential (primary) hypertension: Secondary | ICD-10-CM | POA: Diagnosis not present

## 2018-06-19 DIAGNOSIS — E113313 Type 2 diabetes mellitus with moderate nonproliferative diabetic retinopathy with macular edema, bilateral: Secondary | ICD-10-CM | POA: Diagnosis not present

## 2018-06-20 DIAGNOSIS — E113313 Type 2 diabetes mellitus with moderate nonproliferative diabetic retinopathy with macular edema, bilateral: Secondary | ICD-10-CM | POA: Diagnosis not present

## 2018-07-07 DIAGNOSIS — G4733 Obstructive sleep apnea (adult) (pediatric): Secondary | ICD-10-CM | POA: Diagnosis not present

## 2018-07-21 DIAGNOSIS — I10 Essential (primary) hypertension: Secondary | ICD-10-CM | POA: Diagnosis not present

## 2018-07-21 DIAGNOSIS — E559 Vitamin D deficiency, unspecified: Secondary | ICD-10-CM | POA: Diagnosis not present

## 2018-07-21 DIAGNOSIS — N184 Chronic kidney disease, stage 4 (severe): Secondary | ICD-10-CM | POA: Diagnosis not present

## 2018-07-21 DIAGNOSIS — R8290 Unspecified abnormal findings in urine: Secondary | ICD-10-CM | POA: Diagnosis not present

## 2018-07-21 DIAGNOSIS — E113513 Type 2 diabetes mellitus with proliferative diabetic retinopathy with macular edema, bilateral: Secondary | ICD-10-CM | POA: Diagnosis not present

## 2018-07-21 DIAGNOSIS — Z79899 Other long term (current) drug therapy: Secondary | ICD-10-CM | POA: Diagnosis not present

## 2018-07-21 DIAGNOSIS — J181 Lobar pneumonia, unspecified organism: Secondary | ICD-10-CM | POA: Diagnosis not present

## 2018-07-21 IMAGING — MG MM DIGITAL SCREENING BILAT W/ CAD
6 series · 6 of 6 positions shown · non-contrast
Comparison: Previous exam(s).

CLINICAL DATA: Screening.

EXAM:
DIGITAL SCREENING BILATERAL MAMMOGRAM WITH CAD

[R MLO]
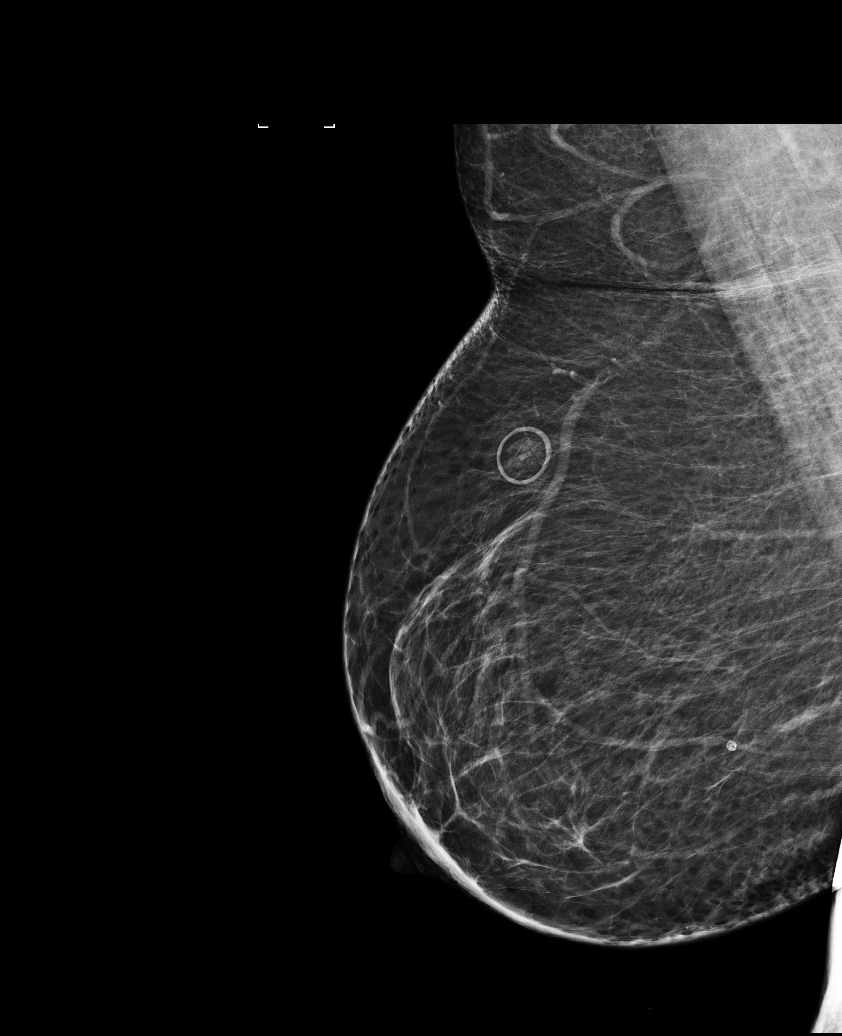

[L MLO (1 of 2)]
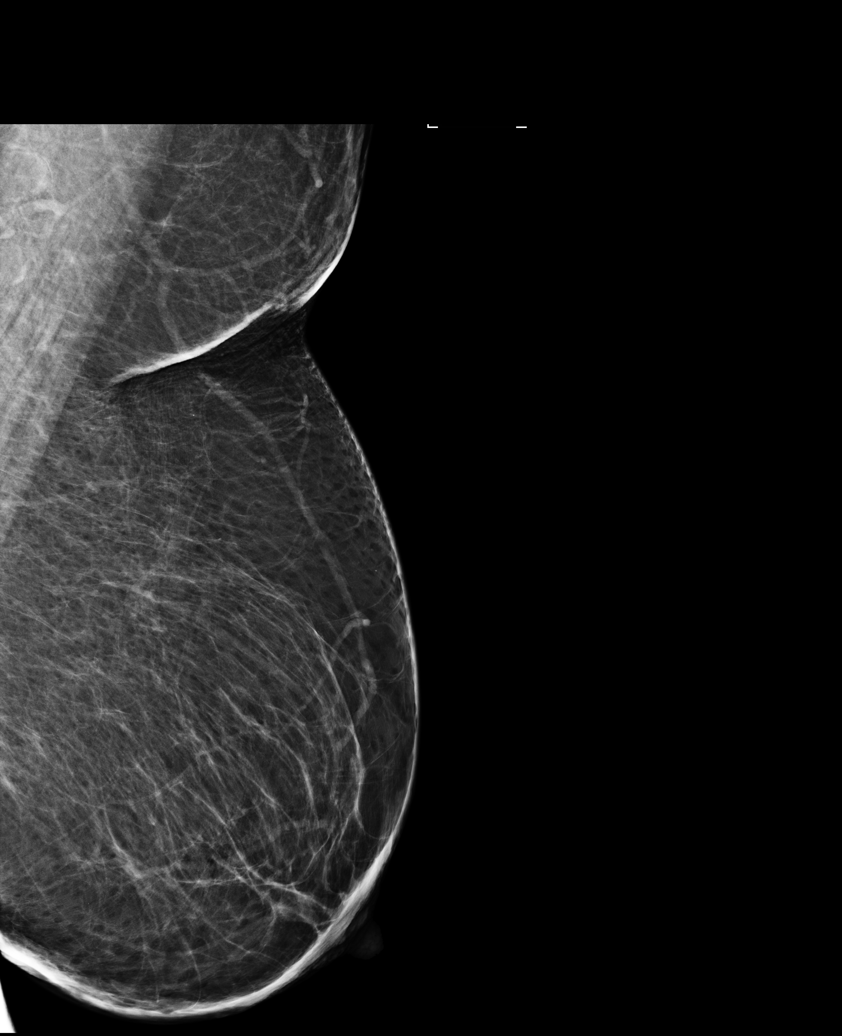

[L CC (1 of 2)]
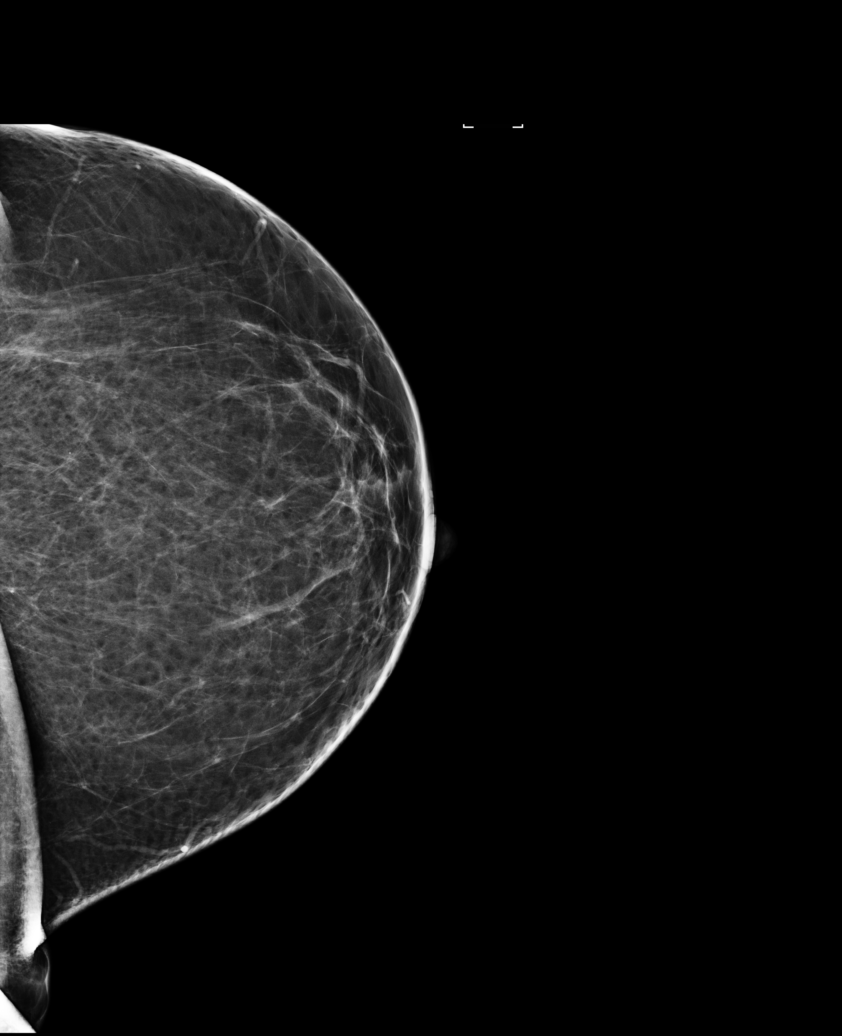

[L MLO (2 of 2)]
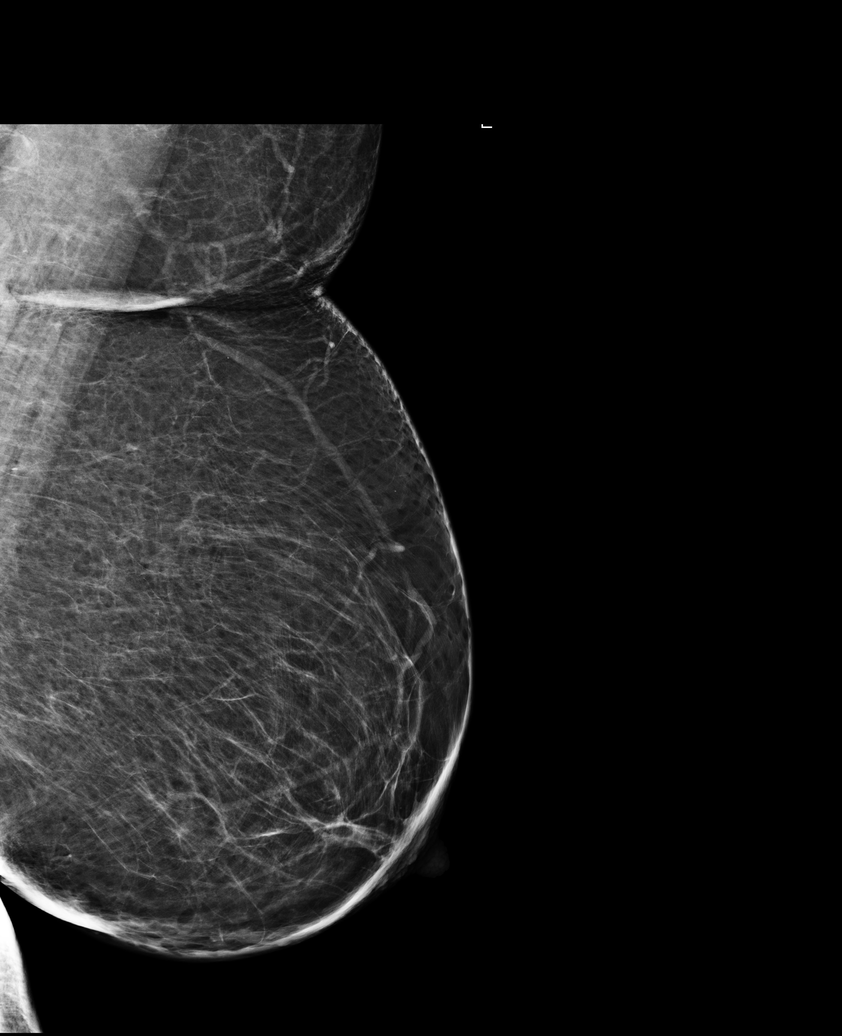

[R CC]
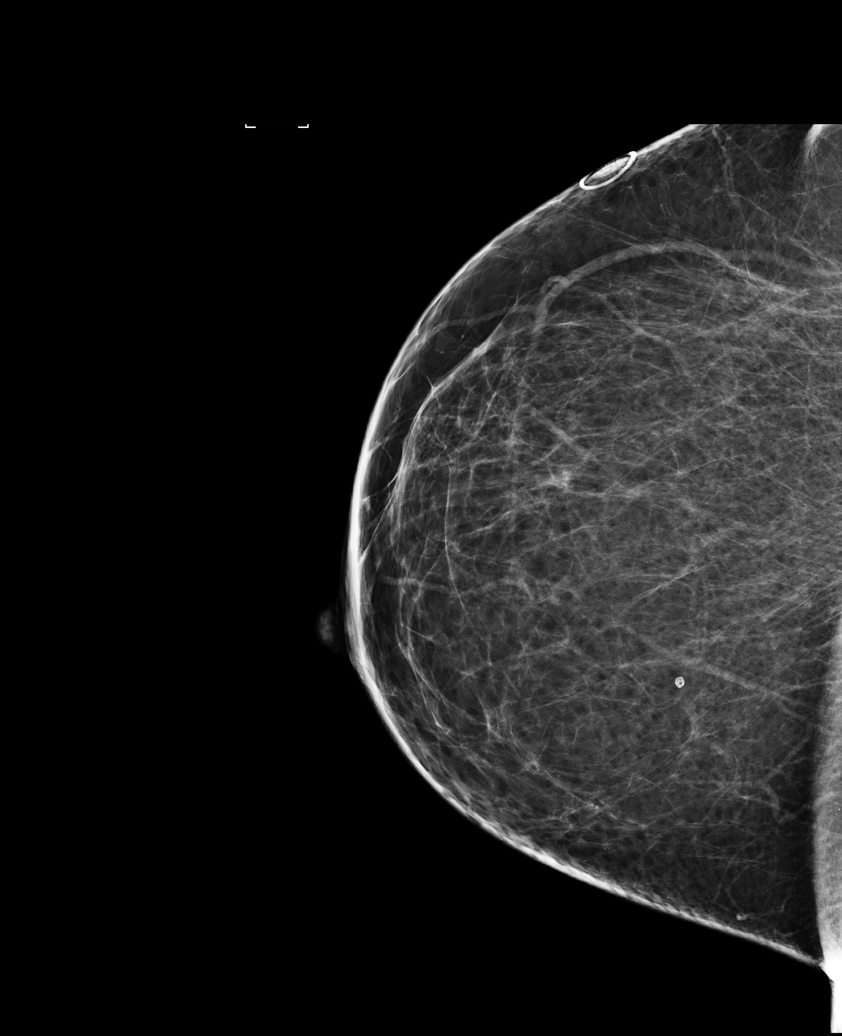

[L CC (2 of 2)]
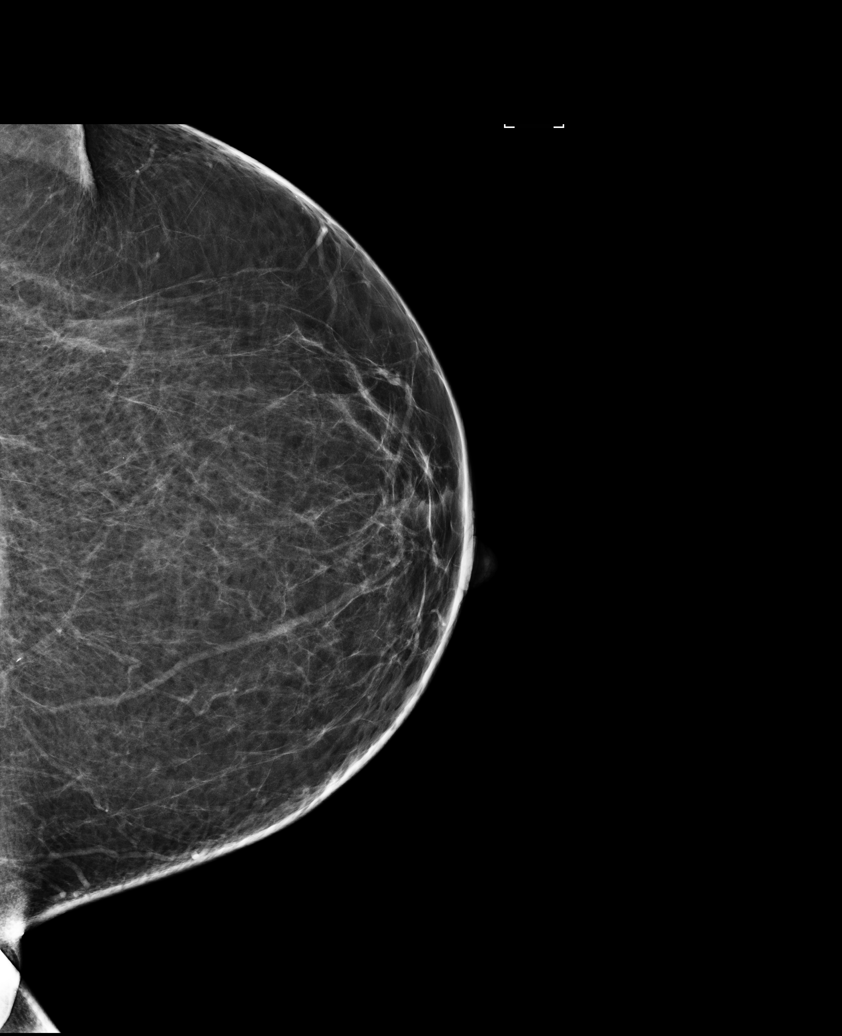

[6 of 6 positions shown; findings below may reference images not displayed]

ACR Breast Density Category b: There are scattered areas of
fibroglandular density.
FINDINGS: There are no findings suspicious for malignancy. Images were
processed with CAD.
IMPRESSION: No mammographic evidence of malignancy. A result letter of this
screening mammogram will be mailed directly to the patient.

RECOMMENDATION:
Screening mammogram in one year. (Code:AS-G-LCT)

BI-RADS CATEGORY  1: Negative.

## 2018-08-11 DIAGNOSIS — E1142 Type 2 diabetes mellitus with diabetic polyneuropathy: Secondary | ICD-10-CM | POA: Diagnosis not present

## 2018-08-11 DIAGNOSIS — Z794 Long term (current) use of insulin: Secondary | ICD-10-CM | POA: Diagnosis not present

## 2018-08-24 DIAGNOSIS — E1142 Type 2 diabetes mellitus with diabetic polyneuropathy: Secondary | ICD-10-CM | POA: Diagnosis not present

## 2018-08-24 DIAGNOSIS — I10 Essential (primary) hypertension: Secondary | ICD-10-CM | POA: Diagnosis not present

## 2018-08-24 DIAGNOSIS — I214 Non-ST elevation (NSTEMI) myocardial infarction: Secondary | ICD-10-CM | POA: Diagnosis not present

## 2018-10-02 DIAGNOSIS — I129 Hypertensive chronic kidney disease with stage 1 through stage 4 chronic kidney disease, or unspecified chronic kidney disease: Secondary | ICD-10-CM | POA: Diagnosis not present

## 2018-10-02 DIAGNOSIS — E1122 Type 2 diabetes mellitus with diabetic chronic kidney disease: Secondary | ICD-10-CM | POA: Diagnosis not present

## 2018-10-02 DIAGNOSIS — N184 Chronic kidney disease, stage 4 (severe): Secondary | ICD-10-CM | POA: Diagnosis not present

## 2018-10-02 DIAGNOSIS — R809 Proteinuria, unspecified: Secondary | ICD-10-CM | POA: Diagnosis not present

## 2018-10-06 DIAGNOSIS — G4733 Obstructive sleep apnea (adult) (pediatric): Secondary | ICD-10-CM | POA: Diagnosis not present

## 2018-10-06 DIAGNOSIS — E113513 Type 2 diabetes mellitus with proliferative diabetic retinopathy with macular edema, bilateral: Secondary | ICD-10-CM | POA: Diagnosis not present

## 2018-10-09 DIAGNOSIS — E1142 Type 2 diabetes mellitus with diabetic polyneuropathy: Secondary | ICD-10-CM | POA: Diagnosis not present

## 2018-10-09 DIAGNOSIS — E041 Nontoxic single thyroid nodule: Secondary | ICD-10-CM | POA: Diagnosis not present

## 2018-10-09 DIAGNOSIS — Z23 Encounter for immunization: Secondary | ICD-10-CM | POA: Diagnosis not present

## 2018-10-09 DIAGNOSIS — Z9189 Other specified personal risk factors, not elsewhere classified: Secondary | ICD-10-CM | POA: Diagnosis not present

## 2018-11-06 DIAGNOSIS — E041 Nontoxic single thyroid nodule: Secondary | ICD-10-CM | POA: Diagnosis not present

## 2018-11-06 DIAGNOSIS — E1142 Type 2 diabetes mellitus with diabetic polyneuropathy: Secondary | ICD-10-CM | POA: Diagnosis not present

## 2018-11-06 DIAGNOSIS — I10 Essential (primary) hypertension: Secondary | ICD-10-CM | POA: Diagnosis not present

## 2018-11-23 LAB — HIV ANTIBODY (ROUTINE TESTING W REFLEX): HIV Screen 4th Generation wRfx: NONREACTIVE

## 2018-12-11 ENCOUNTER — Emergency Department: Payer: 59

## 2018-12-11 ENCOUNTER — Encounter
Admission: EM | Disposition: A | Discharge status: Other Acute Inpatient Hospital | Payer: Self-pay | Source: Home / Self Care | Attending: Surgery

## 2018-12-11 ENCOUNTER — Emergency Department: Payer: 59 | Admitting: Registered Nurse

## 2018-12-11 ENCOUNTER — Encounter: Payer: Self-pay | Admitting: Emergency Medicine

## 2018-12-11 ENCOUNTER — Inpatient Hospital Stay
Admission: EM | Admit: 2018-12-11 | Discharge: 2018-12-12 | DRG: 982 | Disposition: A | Discharge status: Other Acute Inpatient Hospital | Payer: 59 | Attending: Surgery | Admitting: Surgery

## 2018-12-11 ENCOUNTER — Other Ambulatory Visit: Payer: Self-pay

## 2018-12-11 DIAGNOSIS — E669 Obesity, unspecified: Secondary | ICD-10-CM | POA: Diagnosis present

## 2018-12-11 DIAGNOSIS — Z7902 Long term (current) use of antithrombotics/antiplatelets: Secondary | ICD-10-CM

## 2018-12-11 DIAGNOSIS — I252 Old myocardial infarction: Secondary | ICD-10-CM | POA: Diagnosis not present

## 2018-12-11 DIAGNOSIS — Z794 Long term (current) use of insulin: Secondary | ICD-10-CM | POA: Diagnosis not present

## 2018-12-11 DIAGNOSIS — R509 Fever, unspecified: Secondary | ICD-10-CM | POA: Diagnosis not present

## 2018-12-11 DIAGNOSIS — Z955 Presence of coronary angioplasty implant and graft: Secondary | ICD-10-CM | POA: Diagnosis not present

## 2018-12-11 DIAGNOSIS — Z79899 Other long term (current) drug therapy: Secondary | ICD-10-CM

## 2018-12-11 DIAGNOSIS — K760 Fatty (change of) liver, not elsewhere classified: Secondary | ICD-10-CM | POA: Diagnosis not present

## 2018-12-11 DIAGNOSIS — Z951 Presence of aortocoronary bypass graft: Secondary | ICD-10-CM | POA: Diagnosis not present

## 2018-12-11 DIAGNOSIS — Z7982 Long term (current) use of aspirin: Secondary | ICD-10-CM

## 2018-12-11 DIAGNOSIS — Z6836 Body mass index (BMI) 36.0-36.9, adult: Secondary | ICD-10-CM

## 2018-12-11 DIAGNOSIS — N7689 Other specified inflammation of vagina and vulva: Secondary | ICD-10-CM

## 2018-12-11 DIAGNOSIS — E785 Hyperlipidemia, unspecified: Secondary | ICD-10-CM | POA: Diagnosis present

## 2018-12-11 DIAGNOSIS — H548 Legal blindness, as defined in USA: Secondary | ICD-10-CM | POA: Diagnosis present

## 2018-12-11 DIAGNOSIS — E1165 Type 2 diabetes mellitus with hyperglycemia: Secondary | ICD-10-CM | POA: Diagnosis present

## 2018-12-11 DIAGNOSIS — D631 Anemia in chronic kidney disease: Secondary | ICD-10-CM | POA: Diagnosis present

## 2018-12-11 DIAGNOSIS — A419 Sepsis, unspecified organism: Secondary | ICD-10-CM | POA: Diagnosis not present

## 2018-12-11 DIAGNOSIS — M726 Necrotizing fasciitis: Secondary | ICD-10-CM | POA: Diagnosis not present

## 2018-12-11 DIAGNOSIS — K219 Gastro-esophageal reflux disease without esophagitis: Secondary | ICD-10-CM | POA: Diagnosis present

## 2018-12-11 DIAGNOSIS — E1122 Type 2 diabetes mellitus with diabetic chronic kidney disease: Secondary | ICD-10-CM | POA: Diagnosis not present

## 2018-12-11 DIAGNOSIS — E1152 Type 2 diabetes mellitus with diabetic peripheral angiopathy with gangrene: Principal | ICD-10-CM | POA: Diagnosis present

## 2018-12-11 DIAGNOSIS — I2510 Atherosclerotic heart disease of native coronary artery without angina pectoris: Secondary | ICD-10-CM | POA: Diagnosis present

## 2018-12-11 DIAGNOSIS — E11319 Type 2 diabetes mellitus with unspecified diabetic retinopathy without macular edema: Secondary | ICD-10-CM | POA: Diagnosis present

## 2018-12-11 DIAGNOSIS — E1140 Type 2 diabetes mellitus with diabetic neuropathy, unspecified: Secondary | ICD-10-CM | POA: Diagnosis present

## 2018-12-11 DIAGNOSIS — M7989 Other specified soft tissue disorders: Secondary | ICD-10-CM | POA: Diagnosis present

## 2018-12-11 DIAGNOSIS — Z8673 Personal history of transient ischemic attack (TIA), and cerebral infarction without residual deficits: Secondary | ICD-10-CM | POA: Diagnosis not present

## 2018-12-11 DIAGNOSIS — N7682 Fournier disease of vagina and vulva: Secondary | ICD-10-CM

## 2018-12-11 DIAGNOSIS — I129 Hypertensive chronic kidney disease with stage 1 through stage 4 chronic kidney disease, or unspecified chronic kidney disease: Secondary | ICD-10-CM | POA: Diagnosis present

## 2018-12-11 DIAGNOSIS — N184 Chronic kidney disease, stage 4 (severe): Secondary | ICD-10-CM | POA: Diagnosis not present

## 2018-12-11 DIAGNOSIS — K8020 Calculus of gallbladder without cholecystitis without obstruction: Secondary | ICD-10-CM | POA: Diagnosis not present

## 2018-12-11 HISTORY — PX: INCISION AND DRAINAGE ABSCESS: SHX5864

## 2018-12-11 LAB — GLUCOSE, CAPILLARY
Glucose-Capillary: 302 mg/dL — ABNORMAL HIGH (ref 70–99)
Glucose-Capillary: 309 mg/dL — ABNORMAL HIGH (ref 70–99)
Glucose-Capillary: 290 mg/dL — ABNORMAL HIGH (ref 70–99)

## 2018-12-11 LAB — CBC
HEMATOCRIT: 29.6 % — AB (ref 36.0–46.0)
Hemoglobin: 9.8 g/dL — ABNORMAL LOW (ref 12.0–15.0)
MCH: 28.9 pg (ref 26.0–34.0)
MCHC: 33.1 g/dL (ref 30.0–36.0)
MCV: 87.3 fL (ref 80.0–100.0)
NRBC: 0 % (ref 0.0–0.2)
Platelets: 271 10*3/uL (ref 150–400)
RBC: 3.39 MIL/uL — AB (ref 3.87–5.11)
RDW: 14.9 % (ref 11.5–15.5)
WBC: 23.8 10*3/uL — AB (ref 4.0–10.5)

## 2018-12-11 LAB — COMPREHENSIVE METABOLIC PANEL
ALT: 43 U/L (ref 0–44)
ANION GAP: 12 (ref 5–15)
AST: 40 U/L (ref 15–41)
Albumin: 3.2 g/dL — ABNORMAL LOW (ref 3.5–5.0)
Alkaline Phosphatase: 102 U/L (ref 38–126)
BUN: 42 mg/dL — ABNORMAL HIGH (ref 6–20)
CHLORIDE: 100 mmol/L (ref 98–111)
CO2: 21 mmol/L — AB (ref 22–32)
Calcium: 8.6 mg/dL — ABNORMAL LOW (ref 8.9–10.3)
Creatinine, Ser: 2.33 mg/dL — ABNORMAL HIGH (ref 0.44–1.00)
GFR, EST AFRICAN AMERICAN: 26 mL/min — AB (ref >60)
GFR, EST NON AFRICAN AMERICAN: 22 mL/min — AB (ref >60)
Glucose, Bld: 307 mg/dL — ABNORMAL HIGH (ref 70–99)
Potassium: 3.1 mmol/L — ABNORMAL LOW (ref 3.5–5.1)
SODIUM: 133 mmol/L — AB (ref 135–145)
Total Bilirubin: 1 mg/dL (ref 0.3–1.2)
Total Protein: 7.3 g/dL (ref 6.5–8.1)

## 2018-12-11 SURGERY — INCISION AND DRAINAGE, ABSCESS
Anesthesia: General

## 2018-12-11 MED ORDER — FENTANYL CITRATE (PF) 100 MCG/2ML IJ SOLN: 25.0000 ug | INTRAMUSCULAR | Status: DC | PRN

## 2018-12-11 MED ORDER — HYDRALAZINE HCL 20 MG/ML IJ SOLN: 10.0000 mg | INTRAMUSCULAR | Status: DC | PRN

## 2018-12-11 MED ORDER — LIDOCAINE HCL (CARDIAC) PF 100 MG/5ML IV SOSY
PREFILLED_SYRINGE | INTRAVENOUS | Status: DC | PRN
  Administered 2018-12-11: 100 mg via INTRAVENOUS

## 2018-12-11 MED ORDER — SODIUM CHLORIDE 0.9 % IV BOLUS
1000.0000 mL | Freq: Once | INTRAVENOUS | Status: AC
  Administered 2018-12-11: 1000 mL via INTRAVENOUS

## 2018-12-11 MED ORDER — INSULIN ASPART 100 UNIT/ML ~~LOC~~ SOLN
0.0000 [IU] | Freq: Every day | SUBCUTANEOUS | Status: DC
  Administered 2018-12-11: 3 [IU] via SUBCUTANEOUS
  Filled 2018-12-11: qty 1

## 2018-12-11 MED ORDER — PIPERACILLIN-TAZOBACTAM 3.375 G IVPB 30 MIN
3.3750 g | Freq: Once | INTRAVENOUS | Status: AC
  Administered 2018-12-11: 3.375 g via INTRAVENOUS
  Filled 2018-12-11: qty 50

## 2018-12-11 MED ORDER — ONDANSETRON HCL 4 MG/2ML IJ SOLN
INTRAMUSCULAR | Status: AC
  Filled 2018-12-11: qty 2

## 2018-12-11 MED ORDER — ONDANSETRON HCL 4 MG/2ML IJ SOLN: 4.0000 mg | Freq: Once | INTRAMUSCULAR | Status: DC | PRN

## 2018-12-11 MED ORDER — HYDROMORPHONE HCL 1 MG/ML IJ SOLN: 1.0000 mg | INTRAMUSCULAR | Status: DC | PRN

## 2018-12-11 MED ORDER — MIDAZOLAM HCL 2 MG/2ML IJ SOLN
INTRAMUSCULAR | Status: DC | PRN
  Administered 2018-12-11: 2 mg via INTRAVENOUS

## 2018-12-11 MED ORDER — SODIUM CHLORIDE 0.9 % IV SOLN
INTRAVENOUS | Status: DC | PRN
  Administered 2018-12-11 (×2): via INTRAVENOUS

## 2018-12-11 MED ORDER — ACETAMINOPHEN 500 MG PO TABS
1000.0000 mg | ORAL_TABLET | Freq: Four times a day (QID) | ORAL | Status: DC
  Administered 2018-12-11 – 2018-12-12 (×2): 1000 mg via ORAL
  Filled 2018-12-11 (×2): qty 2

## 2018-12-11 MED ORDER — PROCHLORPERAZINE MALEATE 10 MG PO TABS
10.0000 mg | ORAL_TABLET | Freq: Four times a day (QID) | ORAL | Status: DC | PRN
  Filled 2018-12-11: qty 1

## 2018-12-11 MED ORDER — ONDANSETRON 4 MG PO TBDP: 4.0000 mg | ORAL_TABLET | Freq: Four times a day (QID) | ORAL | Status: DC | PRN

## 2018-12-11 MED ORDER — FENTANYL CITRATE (PF) 100 MCG/2ML IJ SOLN
INTRAMUSCULAR | Status: DC | PRN
  Administered 2018-12-11: 25 ug via INTRAVENOUS
  Administered 2018-12-11 (×2): 50 ug via INTRAVENOUS

## 2018-12-11 MED ORDER — ONDANSETRON HCL 4 MG/2ML IJ SOLN
INTRAMUSCULAR | Status: DC | PRN
  Administered 2018-12-11: 4 mg via INTRAVENOUS

## 2018-12-11 MED ORDER — INSULIN ASPART 100 UNIT/ML ~~LOC~~ SOLN
15.0000 [IU] | Freq: Once | SUBCUTANEOUS | Status: AC
  Administered 2018-12-11: 15 [IU] via SUBCUTANEOUS

## 2018-12-11 MED ORDER — PANTOPRAZOLE SODIUM 40 MG IV SOLR
40.0000 mg | Freq: Every day | INTRAVENOUS | Status: DC
  Administered 2018-12-11: 40 mg via INTRAVENOUS
  Filled 2018-12-11: qty 40

## 2018-12-11 MED ORDER — SUGAMMADEX SODIUM 200 MG/2ML IV SOLN
INTRAVENOUS | Status: DC | PRN
  Administered 2018-12-11: 300 mg via INTRAVENOUS

## 2018-12-11 MED ORDER — OXYCODONE HCL 5 MG PO TABS: 5.0000 mg | ORAL_TABLET | ORAL | Status: DC | PRN

## 2018-12-11 MED ORDER — SODIUM CHLORIDE 0.9 % IV SOLN
INTRAVENOUS | Status: DC
  Administered 2018-12-11 – 2018-12-12 (×2): via INTRAVENOUS

## 2018-12-11 MED ORDER — KETOROLAC TROMETHAMINE 30 MG/ML IJ SOLN: 30.0000 mg | Freq: Four times a day (QID) | INTRAMUSCULAR | Status: DC | PRN

## 2018-12-11 MED ORDER — CLINDAMYCIN PHOSPHATE 900 MG/50ML IV SOLN
900.0000 mg | Freq: Once | INTRAVENOUS | Status: AC
  Administered 2018-12-11: 900 mg via INTRAVENOUS

## 2018-12-11 MED ORDER — ONDANSETRON HCL 4 MG/2ML IJ SOLN: 4.0000 mg | Freq: Four times a day (QID) | INTRAMUSCULAR | Status: DC | PRN

## 2018-12-11 MED ORDER — FENTANYL CITRATE (PF) 100 MCG/2ML IJ SOLN
INTRAMUSCULAR | Status: AC
  Filled 2018-12-11: qty 2

## 2018-12-11 MED ORDER — PIPERACILLIN-TAZOBACTAM 3.375 G IVPB
3.3750 g | Freq: Three times a day (TID) | INTRAVENOUS | Status: DC
  Administered 2018-12-11: 3.375 g via INTRAVENOUS
  Filled 2018-12-11: qty 50

## 2018-12-11 MED ORDER — PROPOFOL 10 MG/ML IV BOLUS
INTRAVENOUS | Status: DC | PRN
  Administered 2018-12-11: 40 mg via INTRAVENOUS
  Administered 2018-12-11: 160 mg via INTRAVENOUS

## 2018-12-11 MED ORDER — DAKINS (1/4 STRENGTH) 0.125 % EX SOLN
Freq: Once | CUTANEOUS | Status: DC
  Filled 2018-12-11: qty 473

## 2018-12-11 MED ORDER — HEPARIN SODIUM (PORCINE) 5000 UNIT/ML IJ SOLN
5000.0000 [IU] | Freq: Three times a day (TID) | INTRAMUSCULAR | Status: DC
  Administered 2018-12-11: 5000 [IU] via SUBCUTANEOUS
  Filled 2018-12-11: qty 1

## 2018-12-11 MED ORDER — INSULIN GLARGINE 100 UNIT/ML ~~LOC~~ SOLN
74.0000 [IU] | Freq: Every day | SUBCUTANEOUS | Status: DC
  Administered 2018-12-11: 74 [IU] via SUBCUTANEOUS
  Filled 2018-12-11: qty 0.74

## 2018-12-11 MED ORDER — CLINDAMYCIN PHOSPHATE 600 MG/50ML IV SOLN
600.0000 mg | Freq: Four times a day (QID) | INTRAVENOUS | Status: DC
  Administered 2018-12-12: 600 mg via INTRAVENOUS
  Filled 2018-12-11 (×3): qty 50

## 2018-12-11 MED ORDER — INSULIN ASPART 100 UNIT/ML ~~LOC~~ SOLN
SUBCUTANEOUS | Status: AC
  Administered 2018-12-11: 15 [IU] via SUBCUTANEOUS
  Filled 2018-12-11: qty 1

## 2018-12-11 MED ORDER — METOPROLOL TARTRATE 50 MG PO TABS
50.0000 mg | ORAL_TABLET | Freq: Two times a day (BID) | ORAL | Status: DC
  Administered 2018-12-11: 50 mg via ORAL
  Filled 2018-12-11: qty 1

## 2018-12-11 MED ORDER — PROPOFOL 500 MG/50ML IV EMUL
INTRAVENOUS | Status: DC | PRN
  Administered 2018-12-11: 75 ug/kg/min via INTRAVENOUS

## 2018-12-11 MED ORDER — SODIUM CHLORIDE 0.9 % IV BOLUS: 1000.0000 mL | Freq: Once | INTRAVENOUS | Status: DC

## 2018-12-11 MED ORDER — CLINDAMYCIN PHOSPHATE 900 MG/50ML IV SOLN
INTRAVENOUS | Status: AC
  Filled 2018-12-11: qty 50

## 2018-12-11 MED ORDER — PROPOFOL 10 MG/ML IV BOLUS
INTRAVENOUS | Status: AC
  Filled 2018-12-11: qty 20

## 2018-12-11 MED ORDER — SODIUM CHLORIDE 0.9 % IV SOLN
INTRAVENOUS | Status: DC | PRN
  Administered 2018-12-11: 18:00:00 via INTRAVENOUS

## 2018-12-11 MED ORDER — SUCCINYLCHOLINE CHLORIDE 20 MG/ML IJ SOLN
INTRAMUSCULAR | Status: DC | PRN
  Administered 2018-12-11: 100 mg via INTRAVENOUS

## 2018-12-11 MED ORDER — INSULIN ASPART 100 UNIT/ML ~~LOC~~ SOLN: 6.0000 [IU] | Freq: Three times a day (TID) | SUBCUTANEOUS | Status: DC

## 2018-12-11 MED ORDER — PROCHLORPERAZINE EDISYLATE 10 MG/2ML IJ SOLN
5.0000 mg | Freq: Four times a day (QID) | INTRAMUSCULAR | Status: DC | PRN
  Filled 2018-12-11: qty 2

## 2018-12-11 MED ORDER — ROCURONIUM BROMIDE 100 MG/10ML IV SOLN
INTRAVENOUS | Status: DC | PRN
  Administered 2018-12-11: 10 mg via INTRAVENOUS

## 2018-12-11 MED ORDER — INSULIN ASPART 100 UNIT/ML ~~LOC~~ SOLN: 0.0000 [IU] | Freq: Three times a day (TID) | SUBCUTANEOUS | Status: DC

## 2018-12-11 MED ORDER — VANCOMYCIN HCL IN DEXTROSE 1-5 GM/200ML-% IV SOLN
1000.0000 mg | INTRAVENOUS | Status: DC
  Filled 2018-12-11: qty 200

## 2018-12-11 MED ORDER — MIDAZOLAM HCL 2 MG/2ML IJ SOLN
INTRAMUSCULAR | Status: AC
  Filled 2018-12-11: qty 2

## 2018-12-11 MED ORDER — VANCOMYCIN HCL 10 G IV SOLR
1500.0000 mg | INTRAVENOUS | Status: AC
  Administered 2018-12-11: 1500 mg via INTRAVENOUS
  Filled 2018-12-11: qty 1500

## 2018-12-11 SURGICAL SUPPLY — 27 items
BLADE CLIPPER SURG (BLADE) ×2 IMPLANT
BLADE SURG 15 STRL LF DISP TIS (BLADE) ×1 IMPLANT
BLADE SURG 15 STRL SS (BLADE) ×1
BRUSH SCRUB EZ  4% CHG (MISCELLANEOUS) ×1
BRUSH SCRUB EZ 4% CHG (MISCELLANEOUS) ×1 IMPLANT
CANISTER SUCT 1200ML W/VALVE (MISCELLANEOUS) ×2 IMPLANT
COVER WAND RF STERILE (DRAPES) ×2 IMPLANT
DRAPE LEGGINS SURG 28X43 STRL (DRAPES) ×2 IMPLANT
DRAPE UNDER BUTTOCK W/FLU (DRAPES) ×2 IMPLANT
ELECT CAUTERY BLADE 6.4 (BLADE) ×2 IMPLANT
ELECT REM PT RETURN 9FT ADLT (ELECTROSURGICAL) ×2
ELECTRODE REM PT RTRN 9FT ADLT (ELECTROSURGICAL) ×1 IMPLANT
GLOVE BIO SURGEON STRL SZ7 (GLOVE) ×2 IMPLANT
GOWN STRL REUS W/ TWL LRG LVL3 (GOWN DISPOSABLE) ×2 IMPLANT
GOWN STRL REUS W/TWL LRG LVL3 (GOWN DISPOSABLE) ×2
HANDPIECE INTERPULSE COAX TIP (DISPOSABLE) ×1
NEEDLE HYPO 22GX1.5 SAFETY (NEEDLE) ×2 IMPLANT
NS IRRIG 1000ML POUR BTL (IV SOLUTION) ×2 IMPLANT
PACK BASIN MINOR ARMC (MISCELLANEOUS) ×2 IMPLANT
PAD PREP 24X41 OB/GYN DISP (PERSONAL CARE ITEMS) ×2 IMPLANT
SET HNDPC FAN SPRY TIP SCT (DISPOSABLE) ×1 IMPLANT
SOL PREP PVP 2OZ (MISCELLANEOUS) ×2
SOLUTION PREP PVP 2OZ (MISCELLANEOUS) ×1 IMPLANT
SPONGE LAP 18X18 RF (DISPOSABLE) ×2 IMPLANT
SURGILUBE 2OZ TUBE FLIPTOP (MISCELLANEOUS) ×2 IMPLANT
SWAB DUAL CULTURE TRANS RED ST (MISCELLANEOUS) ×2 IMPLANT
SYR 20CC LL (SYRINGE) ×2 IMPLANT

## 2018-12-11 NOTE — ED Notes (Addendum)
Pt states flu like symptoms x 4 days- N&V&D, chills. States watery stool possibly from ozempic. States she has been having to wipe and thinks she rubbed her skin on her bottom raw. States possible abscess to R buttock, states she thinks it's draining now because pants are wet. Fever Friday and Saturday, none yesterday or today. No tylenol or motrin today.

## 2018-12-11 NOTE — Op Note (Signed)
  12/11/2018  6:05 PM  PATIENT:  Sharon Arias  57 y.o. female  PRE-OPERATIVE DIAGNOSIS:  Necrotizing soft tissue infection perineum  POST-OPERATIVE DIAGNOSIS:  Same  PROCEDURE:   Debridement of Necrotizing soft tissue infection Right perineum and Right labia Majora   SURGEON:  Surgeon(s) and Role:    * Pabon, Marjory Lies, MD - Primary  ANESTHESIA: GETA   DICTATION:  Patient was explainedabout the procedure in detail, risk benefits and possible complications and a consent was obtained. The patient taken to the operating room and placed in a modified lithotomy position.   We performed an excisional debridement incorporating the necrotic tissue to include skin, sub q and muscle. Foul smelling drainage from necrotic tissue was encountered. Apropiate cultures obtained. Using cautery further muscle and sub q was excised until we found viable tissue. TOtal wound measured 14 cm length 7 cm width and 6 cm depth. It did not involve the rectum but did involve the ischiorectal space and right labia.  Hemostasis was obtained with electrocautery Pulse lavage performed w 3 lts of saline. .25% Dakin solution Kerlix was used to pack the cavity.  Needle and laparotomy counts were correct and there were no immediate complications.  Given the gravity of the infection she may need to be transferred to a tertiary facility.  Diego Sarita Haver, MD

## 2018-12-11 NOTE — Discharge Summary (Signed)
  Patient ID: DAKAYLA DISANTI MRN: [id number] DOB/AGE: May 29, 1961 57 y.o.  Admit date: 12/11/2018 Discharge date: 12/11/2018   Discharge Diagnoses:  Active Problems:   Necrotizing soft tissue infection   Procedures: Debridement of Right vulva and ischiorectal space on the right  Hospital Course: 57 yo obese and diabetic female seen in the ER with increase in WBC, febrile, uncontrolled diabetes and with a soft tissue infection of perineum and vulva. CT confirming necrotizing infection with gas forming bacteria. SHe was resuscitated , started on broad spectrum antibiotics and taken emergently to the OR for wide debridement to include skin, sub q and muscle. Because anticipated complex care to include further debridements and reconstruction I contacted [facility] Dr.Reid for transferring the pt to a tertiary facility.  She has been averaging BS above 300 , hemodynamic have been adequate. After d/w Dr. Joneen Caraway She accepted her in transfer and actually will be moved to the SICU at [facility]. At the time of discharge the pt was awake and alert, her vitals were adequate. Chest: no resp distress, NSR . Abd: soft, NT GU: there is an open wound to the right labia majora and ischiorectal space w Kerlix soaked in dakin solution. Ext: well perfused and warm. She will continue on broad spectrum a/bs to include zosyn, clinda and vancomycin. The patient and family are in agreement with the transfer Condition at the time of DC was stable  Disposition: [facility]  Discharge Instructions    Call MD for:  difficulty breathing, headache or visual disturbances   Complete by:  As directed    Call MD for:  extreme fatigue   Complete by:  As directed    Call MD for:  hives   Complete by:  As directed    Call MD for:  persistant dizziness or light-headedness   Complete by:  As directed    Call MD for:  persistant nausea and vomiting   Complete by:  As directed    Call MD for:  redness, tenderness, or signs of infection (pain,  swelling, redness, odor or green/yellow discharge around incision site)   Complete by:  As directed    Call MD for:  severe uncontrolled pain   Complete by:  As directed    Call MD for:  temperature >100.4   Complete by:  As directed    Diet - low sodium heart healthy   Complete by:  As directed    Discharge instructions   Complete by:  As directed    Transfer to [facility] acute care surgery   Increase activity slowly   Complete by:  As directed      Allergies as of 12/11/2018   No Known Allergies     Medication List    STOP taking these medications   allopurinol 100 MG tablet Commonly known as:  ZYLOPRIM   amLODipine 10 MG tablet Commonly known as:  NORVASC   aspirin EC 81 MG tablet   atorvastatin 40 MG tablet Commonly known as:  LIPITOR   furosemide 40 MG tablet Commonly known as:  LASIX   HUMALOG KWIKPEN 100 UNIT/ML KiwkPen Generic drug:  insulin lispro   LANTUS SOLOSTAR 100 UNIT/ML Solostar Pen Generic drug:  Insulin Glargine   losartan 100 MG tablet Commonly known as:  COZAAR   metoprolol tartrate 50 MG tablet Commonly known as:  Deveron Furlong, MD FACS

## 2018-12-11 NOTE — H&P (Signed)
Patient ID: Sharon Arias, female   DOB: 1961-05-22, 57 y.o.   MRN: [id number]  HPI Sharon Arias is a 57 y.o. female seen and evaluated in the emergency for necrotizing soft tissue infection.  She has a history of obesity, chronic kidney disease and diabetes.  She came in complaining of diarrhea and edema and pain around the right buttocks and right labia.  The pain is moderate to severe intensity, sharp and worsening when she sits on her right buttocks.  Poor some low-grade temperature and fevers or chills.   she also had some nausea and vomiting. I count is 23.8 with a hemoglobin of 9.8, glucose of 307 and a creatinine of 2.3. T scan personally reviewed showing evidence for Tyson soft tissue infection in the right perineum.  HPI  Past Medical History:  Diagnosis Date  . Anemia in chronic kidney disease 05/19/2016  . CAD (coronary artery disease)   . Chicken pox   . Chronic kidney disease    per dr sparks  . CVA (cerebral vascular accident) ([place])   . Detached retina   . Diabetes mellitus without complication ([place])    type 2  . Diabetic neuropathy ([place])   . Diabetic retinopathy ([place])    legally blind  . Hyperlipidemia   . Hypertension   . Myocardial infarction ([place])   . Obesity   . PONV (postoperative nausea and vomiting)   . Sciatica of right side    going to see physiciatry    Past Surgical History:  Procedure Laterality Date  . COLONOSCOPY WITH PROPOFOL N/A 01/26/2016   Procedure: COLONOSCOPY WITH PROPOFOL;  Surgeon: Lollie Sails, MD;  Location: [facility] ENDOSCOPY;  Service: Endoscopy;  Laterality: N/A;  . COLONOSCOPY WITH PROPOFOL N/A 01/27/2016   Procedure: COLONOSCOPY WITH PROPOFOL;  Surgeon: Lollie Sails, MD;  Location: [facility] ENDOSCOPY;  Service: Endoscopy;  Laterality: N/A;  . CORONARY ARTERY BYPASS GRAFT  2013  . INCISION AND DRAINAGE     chest abscess  . RETINAL LASER PROCEDURE      Family History  Problem Relation Age of Onset  . Breast cancer Mother   .  Prostate cancer Father        we think mets to liver and bone    Social History Social History   Tobacco Use  . Smoking status: Never Smoker  . Smokeless tobacco: Never Used  Substance Use Topics  . Alcohol use: No  . Drug use: No    No Known Allergies  Current Facility-Administered Medications  Medication Dose Route Frequency Provider Last Rate Last Dose  . clindamycin (CLEOCIN) IVPB 900 mg  900 mg Intravenous Once [name], MD      . sodium chloride 0.9 % bolus 1,000 mL  1,000 mL Intravenous Once [name], [place] F, MD      . vancomycin (VANCOCIN) 1,500 mg in sodium chloride 0.9 % 500 mL IVPB  1,500 mg Intravenous [name], MD 250 mL/hr at 12/11/18 1452 1,500 mg at 12/11/18 1452   Current Outpatient Medications  Medication Sig Dispense Refill  . allopurinol (ZYLOPRIM) 100 MG tablet Take 200-300 mg by mouth See admin instructions. 300 mg every morning and 200 mg at bedtime    . amLODipine (NORVASC) 10 MG tablet Take 10 mg by mouth at bedtime.     [name] aspirin EC 81 MG tablet Take 81 mg by mouth daily.     [name] atorvastatin (LIPITOR) 40 MG tablet Take 40 mg by mouth at bedtime.     [name]  furosemide (LASIX) 40 MG tablet Take 40 mg by mouth daily.     . Insulin Glargine (LANTUS SOLOSTAR) 100 UNIT/ML Solostar Pen Inject 74 Units into the skin at bedtime. (split into 2 shots at 2 different sites)    . insulin lispro (HUMALOG KWIKPEN) 100 UNIT/ML KiwkPen Inject 15-30 Units into the skin See admin instructions. 15 units daily with breakfast, 25 units daily with lunch, and 30 units daily with dinner; plus more if needed per sliding scale    . losartan (COZAAR) 100 MG tablet Take 100 mg by mouth daily.     . metoprolol (LOPRESSOR) 50 MG tablet Take 50 mg by mouth 2 (two) times daily.        Review of Systems Full ROS  was asked and was negative except for the information on the HPI  Physical Exam Blood pressure (!) 165/63, pulse 98, temperature 100.3 F (37.9 C),  temperature source Oral, resp. rate 18, height 5\' 8"  (1.727 m), weight 109.8 kg, SpO2 97 %. CONSTITUTIONAL: Febrile, obese EYES: Pupils are equal, round, and reactive to light, Sclera are non-icteric. EARS, NOSE, MOUTH AND THROAT: The oropharynx is clear. The oral mucosa is pink and moist. Hearing is intact to voice. LYMPH NODES:  Lymph nodes in the neck are normal. RESPIRATORY:  Lungs are clear. There is normal respiratory effort, with equal breath sounds bilaterally, and without pathologic use of accessory muscles. CARDIOVASCULAR: Heart is regular without murmurs, gallops, or rubs. GI: The abdomen is  soft, nontender, and nondistended. There are no palpable masses. There is no hepatosplenomegaly. There are normal bowel sounds in all quadrants. GU there is evidence of induration erythema and crepitus involving the Right labia perianal area with malodorous drainage and exquisitely tender area  MUSCULOSKELETAL: Normal muscle strength and tone. No cyanosis or edema.   SKIN: Turgor is good and there are no pathologic skin lesions or ulcers. NEUROLOGIC: Motor and sensation is grossly normal. Cranial nerves are grossly intact. PSYCH:  Oriented to person, place and time. Affect is normal.  Data Reviewed I have personally reviewed the patient's imaging, laboratory findings and medical records.    Assessment/ Plan 57 year old female with a necrotizing soft tissue infection of the perineum involving the right labia and right perianal area need for emergent debridement.  I have started broad-spectrum antibiotics to include Zosyn, vancomycin and clindamycin.  I have given her another liter of crystalloid and have talked to the OR to expedite this emergent operation.  Discussed with the patient in detail about the procedure.  Risk benefits and possible complications including but not limited to: Bleeding, infection, chronic pain, chronic wound problems, re-interventions are likely to occur.  I also discussed  with her about postoperative care including intensive care unit care as well as possible transfer to a tertiary facility.  She understands and she is in agreement.  We Will expedite the emergency of this procedure  Caroleen Hamman, MD FACS General Surgeon 12/11/2018, 3:46 PM

## 2018-12-11 NOTE — ED Notes (Signed)
Pt taken to CT via stretcher, will administer medications when pt returns.

## 2018-12-11 NOTE — Progress Notes (Signed)
Pharmacy Antibiotic Note  Sharon Arias is a 57 y.o. female admitted on 12/11/2018 with necrotizing fascitis.  Pharmacy has been consulted for Vancomycin dosing.  Plan:  Vancomycin 1500 mg IV X 1 given on 12/23 @ 1500.  Vancomycin 1 gm IV Q24H ordered to start on 12/24 @ 0400, ~ 13 hrs after 1st dose (stacked dosing). Pt will reach Css by 12/27 @ 1500. Will draw 1st trough on 12/27 @ 0300, which will be approaching Css.   AdjBW = 82.3 kg CrCl = 34.6 ml/min ke = 0.033 hr-1 T1/2 = 21 hrs Vd = 57.6 L   Height: 5\' 8"  (172.7 cm) Weight: 242 lb (109.8 kg) IBW/kg (Calculated) : 63.9  Temp (24hrs), Avg:99.6 F (37.6 C), Min:98.5 F (36.9 C), Max:100.3 F (37.9 C)  Recent Labs  Lab 12/11/18 1131  WBC 23.8*  CREATININE 2.33*    Estimated Creatinine Clearance: 34.6 mL/min (A) (by C-G formula based on SCr of 2.33 mg/dL (H)).    No Known Allergies  Antimicrobials this admission:   >>    >>   Dose adjustments this admission:   Microbiology results:  BCx:   UCx:    Sputum:    MRSA PCR:   Thank you for allowing pharmacy to be a part of this patient's care.  Neena Beecham D 12/11/2018 6:52 PM

## 2018-12-11 NOTE — Transfer of Care (Signed)
Immediate Anesthesia Transfer of Care Note  Patient: Sharon Arias  Procedure(s) Performed: INCISION AND DRAINAGE PERINEAL (N/A )  Patient Location: PACU  Anesthesia Type:General  Level of Consciousness: awake, alert  and oriented  Airway & Oxygen Therapy: Patient Spontanous Breathing and Patient connected to face mask oxygen  Post-op Assessment: Report given to RN and Post -op Vital signs reviewed and stable  Post vital signs: Reviewed and stable  Last Vitals:  Vitals Value Taken Time  BP 121/96 12/11/2018  6:26 PM  Temp    Pulse 79 12/11/2018  6:28 PM  Resp 21 12/11/2018  6:28 PM  SpO2 96 % 12/11/2018  6:28 PM  Vitals shown include unvalidated device data.  Last Pain:  Vitals:   12/11/18 1547  TempSrc: Oral  PainSc: 0-No pain         Complications: No apparent anesthesia complications

## 2018-12-11 NOTE — Anesthesia Preprocedure Evaluation (Signed)
Anesthesia Evaluation  Patient identified by MRN, date of birth, ID band Patient awake    Reviewed: Allergy & Precautions, NPO status , Patient's Chart, lab work & pertinent test results  History of Anesthesia Complications (+) PONV and history of anesthetic complications  Airway Mallampati: III       Dental   Pulmonary sleep apnea , neg COPD,           Cardiovascular hypertension, + CAD, + Past MI, + Cardiac Stents (after DABG) and + CABG  (-) CHF (-) dysrhythmias (-) Valvular Problems/Murmurs     Neuro/Psych neg Seizures CVA    GI/Hepatic Neg liver ROS, GERD  ,  Endo/Other  diabetes, Type 2, Insulin Dependent  Renal/GU Renal InsufficiencyRenal disease     Musculoskeletal   Abdominal   Peds  Hematology   Anesthesia Other Findings   Reproductive/Obstetrics                             Anesthesia Physical Anesthesia Plan  ASA: III and emergent  Anesthesia Plan: General   Post-op Pain Management:    Induction: Intravenous  PONV Risk Score and Plan: Propofol infusion, Ondansetron, Midazolam and Treatment may vary due to age or medical condition  Airway Management Planned: Oral ETT  Additional Equipment:   Intra-op Plan:   Post-operative Plan:   Informed Consent: I have reviewed the patients History and Physical, chart, labs and discussed the procedure including the risks, benefits and alternatives for the proposed anesthesia with the patient or authorized representative who has indicated his/her understanding and acceptance.     Plan Discussed with:   Anesthesia Plan Comments:         Anesthesia Quick Evaluation

## 2018-12-11 NOTE — ED Notes (Signed)
Report given to OR.

## 2018-12-11 NOTE — ED Provider Notes (Signed)
[facility] Emergency Department Provider Note  ____________________________________________  Time seen: Approximately 2:52 PM  I have reviewed the triage vital signs and the nursing notes.   HISTORY  Chief Complaint Nausea; Emesis; Abscess; and Fever    HPI Sharon Arias is a 57 y.o. female with a history of hypertension diabetes CKD and obesity who complains of diarrhea for the past 4 days as well as pain and swelling near the anus on the right buttock.  She does report having some subjective fever over the past 3 days as well.  Pain is constant, worse with movement and pressure.  No alleviating factors.  Nonradiating.      Past Medical History:  Diagnosis Date  . Anemia in chronic kidney disease 05/19/2016  . CAD (coronary artery disease)   . Chicken pox   . Chronic kidney disease    per dr sparks  . CVA (cerebral vascular accident) ([place])   . Detached retina   . Diabetes mellitus without complication ([place])    type 2  . Diabetic neuropathy ([place])   . Diabetic retinopathy ([place])    legally blind  . Hyperlipidemia   . Hypertension   . Myocardial infarction ([place])   . Obesity   . PONV (postoperative nausea and vomiting)   . Sciatica of right side    going to see physiciatry     Patient Active Problem List   Diagnosis Date Noted  . CAP (community acquired pneumonia) 03/18/2018  . Hypocalcemia 03/18/2018  . Hypokalemia 03/18/2018  . CKD (chronic kidney disease), stage IV ([place]) 09/29/2016  . Chronic arthritis 09/29/2016  . Anemia in chronic kidney disease 05/19/2016  . DDD (degenerative disc disease), lumbar 01/13/2016  . Well controlled type 2 diabetes mellitus ([place]) 10/01/2015  . Proliferative diabetic retinopathy with macular edema associated with type 2 diabetes mellitus ([place]) 11/13/2014  . Coronary artery disease 08/17/2012  . Obesity 08/16/2012  . Myocardial infarction ([place]) 08/16/2012  . Hyperlipidemia, unspecified 08/16/2012  . HTN  (hypertension) 08/16/2012     Past Surgical History:  Procedure Laterality Date  . COLONOSCOPY WITH PROPOFOL N/A 01/26/2016   Procedure: COLONOSCOPY WITH PROPOFOL;  Surgeon: Lollie Sails, MD;  Location: [facility] ENDOSCOPY;  Service: Endoscopy;  Laterality: N/A;  . COLONOSCOPY WITH PROPOFOL N/A 01/27/2016   Procedure: COLONOSCOPY WITH PROPOFOL;  Surgeon: Lollie Sails, MD;  Location: [facility] ENDOSCOPY;  Service: Endoscopy;  Laterality: N/A;  . CORONARY ARTERY BYPASS GRAFT  2013  . INCISION AND DRAINAGE     chest abscess  . RETINAL LASER PROCEDURE       Prior to Admission medications   Medication Sig Start Date End Date Taking? Authorizing Provider  allopurinol (ZYLOPRIM) 100 MG tablet Take 200 mg by mouth at bedtime.  02/11/17 03/18/18  [provider]  allopurinol (ZYLOPRIM) 300 MG tablet Take 300 mg by mouth daily.    [provider]  amLODipine (NORVASC) 10 MG tablet Take 10 mg by mouth at bedtime.  05/20/15   [provider]  aspirin EC 81 MG tablet Take 81 mg by mouth daily.     [provider]  atorvastatin (LIPITOR) 40 MG tablet Take 40 mg by mouth at bedtime.  05/05/15   [provider]  furosemide (LASIX) 40 MG tablet Take 1 tablet by mouth  daily 10/06/15   [provider]  guaiFENesin-dextromethorphan (ROBITUSSIN DM) 100-10 MG/5ML syrup Take 5 mLs by mouth every 4 (four) hours as needed for cough. Patient not taking: Reported  on 12/11/2018 03/20/18   Demetrios Loll, MD  Insulin Glargine (LANTUS SOLOSTAR) 100 UNIT/ML Solostar Pen Inject subcutaneously 80  units at bedtime ( Split  into 2 shots of 40 units  each at 2 different sites ) 07/18/15   [provider]  insulin lispro (HUMALOG KWIKPEN) 100 UNIT/ML KiwkPen Inject subcutaneously 22 units with breakfast,24 units with lunch,and 32 units with dinner, plus sliding scale as directed. 09/16/15   [provider]  levofloxacin (LEVAQUIN) 750 MG tablet Take 1 tablet (750 mg  total) by mouth every other day. Patient not taking: Reported on 12/11/2018 03/20/18   Demetrios Loll, MD  losartan (COZAAR) 100 MG tablet Take 100 mg by mouth daily.  04/08/14   [provider]  metoprolol (LOPRESSOR) 50 MG tablet Take 1 tablet by mouth two  times daily 07/07/15   [provider]  ticagrelor (BRILINTA) 90 MG TABS tablet Take 90 mg by mouth 2 (two) times daily.  02/04/17   [provider]     Allergies Patient has no known allergies.   Family History  Problem Relation Age of Onset  . Breast cancer Mother   . Prostate cancer Father        we think mets to liver and bone    Social History Social History   Tobacco Use  . Smoking status: Never Smoker  . Smokeless tobacco: Never Used  Substance Use Topics  . Alcohol use: No  . Drug use: No    Review of Systems  Constitutional: Positive fever ENT:   No sore throat. No rhinorrhea. Cardiovascular:   No chest pain or syncope. Respiratory:   No dyspnea or cough. Gastrointestinal:   Positive rectal pain, vomiting and diarrhea..  Musculoskeletal:   Negative for focal pain or swelling All other systems reviewed and are negative except as documented above in ROS and HPI.  ____________________________________________   PHYSICAL EXAM:  VITAL SIGNS: ED Triage Vitals  Enc Vitals Group     BP 12/11/18 1126 (!) 140/94     Pulse Rate 12/11/18 1126 99     Resp 12/11/18 1126 20     Temp 12/11/18 1126 98.5 F (36.9 C)     Temp Source 12/11/18 1126 Oral     SpO2 12/11/18 1126 100 %     Weight 12/11/18 1127 242 lb (109.8 kg)     Height 12/11/18 1127 5\' 8"  (1.727 m)     Head Circumference --      Peak Flow --      Pain Score 12/11/18 1126 10     Pain Loc --      Pain Edu? --      Excl. in [place]? --     Vital signs reviewed, nursing assessments reviewed.   Constitutional:   Alert and oriented. Non-toxic appearance. Eyes:   Conjunctivae are normal. EOMI. PERRL. ENT      Head:   Normocephalic  and atraumatic.      Nose:   No congestion/rhinnorhea.       Mouth/Throat:   MMM, no pharyngeal erythema. No peritonsillar mass.       Neck:   No meningismus. Full ROM. Hematological/Lymphatic/Immunilogical:   No cervical lymphadenopathy. Cardiovascular:   RRR. Symmetric bilateral radial and DP pulses.  No murmurs. Cap refill less than 2 seconds. Respiratory:   Normal respiratory effort without tachypnea/retractions. Breath sounds are clear and equal bilaterally. No wheezes/rales/rhonchi. Gastrointestinal:   Soft and nontender. Non distended. There is no CVA tenderness.  No rebound,  rigidity, or guarding.  Digital rectal exam reveals some fullness of the right lateral rectal space.  No purulent drainage.  Hemoccult negative. Genitourinary:   Perineal induration, fluctuance, and crepitus extending from the vulvovaginal perineum posteriorly to the right perirectal area.  There is purplish discoloration of the area Musculoskeletal:   Normal range of motion in all extremities. No joint effusions.  No lower extremity tenderness.  No edema. Neurologic:   Normal speech and language.  Motor grossly intact. No acute focal neurologic deficits are appreciated.  Skin:    Skin is warm, dry and intact. No rash noted.  No petechiae, purpura, or bullae.  ____________________________________________    LABS (pertinent positives/negatives) (all labs ordered are listed, but only abnormal results are displayed) Labs Reviewed  COMPREHENSIVE METABOLIC PANEL - Abnormal; Notable for the following components:      Result Value   Sodium 133 (*)    Potassium 3.1 (*)    CO2 21 (*)    Glucose, Bld 307 (*)    BUN 42 (*)    Creatinine, Ser 2.33 (*)    Calcium 8.6 (*)    Albumin 3.2 (*)    GFR calc non Af Amer 22 (*)    GFR calc Af Amer 26 (*)    All other components within normal limits  CBC - Abnormal; Notable for the following components:   WBC 23.8 (*)    RBC 3.39 (*)    Hemoglobin 9.8 (*)    HCT 29.6 (*)     All other components within normal limits   ____________________________________________   EKG    ____________________________________________    RADIOLOGY  Ct Abdomen Pelvis Wo Contrast  Result Date: 12/11/2018 CLINICAL DATA:  Right buttock pain and drainage. Fever. Evaluate for perirectal abscess versus Fournier's gangrene. EXAM: CT ABDOMEN AND PELVIS WITHOUT CONTRAST TECHNIQUE: Multidetector CT imaging of the abdomen and pelvis was performed following the standard protocol without IV contrast. COMPARISON:  None. FINDINGS: Lower chest: No acute abnormality. Hepatobiliary: Mild hepatic steatosis. No focal liver abnormality. Cholelithiasis. No gallbladder wall thickening or biliary dilatation. Pancreas: Unremarkable. No pancreatic ductal dilatation or surrounding inflammatory changes. Spleen: Normal in size without focal abnormality. Adrenals/Urinary Tract: The adrenal glands are unremarkable. Mild bilateral renal atrophy. Bilateral renal vascular calcifications. No renal or ureteral calculi. No hydronephrosis. Bladder is unremarkable. Stomach/Bowel: Stomach is within normal limits. Appendix appears normal. No evidence of bowel wall thickening, distention, or inflammatory changes. Vascular/Lymphatic: Aortoiliac atherosclerosis. Borderline enlarged right inguinal, external iliac, and pelvic sidewall lymph nodes are likely reactive. Reproductive: Uterus and bilateral adnexa are unremarkable. Other: No free fluid or pneumoperitoneum. Musculoskeletal: Prominent soft tissue stranding and subcutaneous emphysema in the inferior right gluteal fold extending into the right ischioanal fossa. No discrete fluid collection. No acute or significant osseous findings. IMPRESSION: 1. Prominent soft tissue stranding and subcutaneous emphysema in the inferior right gluteal fold extending into the right ischioanal fossa, consistent with necrotizing infection (Fournier's gangrene). No discrete fluid collection. 2.  Hepatic steatosis. 3. Cholelithiasis. 4. Aortic atherosclerosis (ICD10-I70.0). These results were called by telephone at the time of interpretation on 12/11/2018 at 2:27 pm to Dr. [name] , who verbally acknowledged these results. Electronically Signed   By: Titus Dubin M.D.   On: 12/11/2018 14:30    ____________________________________________   PROCEDURES .Critical Care Performed by: [name], MD Authorized by: [name], MD   Critical care provider statement:    Critical care time (minutes):  30   Critical care time was exclusive  of:  Separately billable procedures and treating other patients   Critical care was necessary to treat or prevent imminent or life-threatening deterioration of the following conditions:  Sepsis   Critical care was time spent personally by me on the following activities:  Development of treatment plan with patient or surrogate, discussions with consultants, evaluation of patient's response to treatment, examination of patient, obtaining history from patient or surrogate, ordering and performing treatments and interventions, ordering and review of laboratory studies, ordering and review of radiographic studies, pulse oximetry, re-evaluation of patient's condition and review of old charts    ____________________________________________  DIFFERENTIAL DIAGNOSIS   Perirectal abscess, Fournier's gangrene, cutaneous soft tissue infection  CLINICAL IMPRESSION / ASSESSMENT AND PLAN / ED COURSE  Pertinent labs & imaging results that were available during my care of the patient were reviewed by me and considered in my medical decision making (see chart for details).    Patient presents with pain and inflammatory changes of the perineum and perianal soft tissues.  Induration fluctuance and crepitus on exam is concerning for necrotizing fasciitis/Fournier's gangrene.  Obtain a noncontrast CT scan given her CKD, IV saline bolus, start Zosyn  clindamycin and vancomycin.  Clinical Course as of Dec 12 1451  Mon Dec 11, 2018  1430 Received call from radiology regarding CT scan which does show subcutaneous emphysema and consistent with Fournier's gangrene .  General surgery paged.   [PS]    Clinical Course User Index [PS] [name], MD     ----------------------------------------- 3:07 PM on 12/11/2018 -----------------------------------------  Case was discussed with Dr. Dahlia Byes who will evaluate for further surgical management.  ____________________________________________   FINAL CLINICAL IMPRESSION(S) / ED DIAGNOSES    Final diagnoses:  Fournier's gangrene in female     ED Discharge Orders    None      Portions of this note were generated with dragon dictation software. Dictation errors may occur despite best attempts at proofreading.   [name], MD 12/11/18 [id number]

## 2018-12-11 NOTE — Anesthesia Post-op Follow-up Note (Signed)
Anesthesia QCDR form completed.        

## 2018-12-11 NOTE — Anesthesia Postprocedure Evaluation (Signed)
Anesthesia Post Note  Patient: Sharon Arias  Procedure(s) Performed: INCISION AND DRAINAGE PERINEAL (N/A )  Patient location during evaluation: PACU Anesthesia Type: General Level of consciousness: awake and alert Pain management: pain level controlled Vital Signs Assessment: post-procedure vital signs reviewed and stable Respiratory status: spontaneous breathing and respiratory function stable Cardiovascular status: stable Anesthetic complications: no     Last Vitals:  Vitals:   12/11/18 1843 12/11/18 1854  BP: (!) 117/48   Pulse: 76 73  Resp: 18 20  Temp:    SpO2: 93% 94%    Last Pain:  Vitals:   12/11/18 1843  TempSrc:   PainSc: 0-No pain                 KEPHART,WILLIAM K

## 2018-12-11 NOTE — ED Notes (Signed)
Pt A&O, ambulatory to treatment room. Mask on face. No distress noted.

## 2018-12-11 NOTE — ED Triage Notes (Signed)
Pt reports she thinks she has the flu and also has a really bad abscess on her right buttocks. Pt reports it is so painful that it hurts to wear clothes. Pt unsure of what is causing the abscess but thinks it is a medication called ozempic because it made her have diarrhea so much that she thinks she rubbed it raw.

## 2018-12-11 NOTE — ED Notes (Signed)
Patient states understanding of informed consent for surgery and had no questions. Writer witnessed paperwork.

## 2018-12-11 NOTE — ED Notes (Signed)
Pt to the OR. IV fluids paused and taken off the pump for transport.

## 2018-12-11 NOTE — Anesthesia Procedure Notes (Signed)
Procedure Name: Intubation Date/Time: 12/11/2018 5:08 PM Performed by: Aline Brochure, CRNA Pre-anesthesia Checklist: Patient identified, Emergency Drugs available, Suction available and Patient being monitored Patient Re-evaluated:Patient Re-evaluated prior to induction Oxygen Delivery Method: Circle system utilized Preoxygenation: Pre-oxygenation with 100% oxygen Induction Type: IV induction and Cricoid Pressure applied Ventilation: Oral airway inserted - appropriate to patient size, Mask ventilation with difficulty and Two handed mask ventilation required Laryngoscope Size: McGraph and 3 Grade View: Grade II Tube type: Oral Tube size: 7.0 mm Number of attempts: 2 Airway Equipment and Method: Stylet and Video-laryngoscopy Placement Confirmation: ETT inserted through vocal cords under direct vision,  positive ETCO2 and breath sounds checked- equal and bilateral Secured at: 20 cm Tube secured with: Tape Dental Injury: Teeth and Oropharynx as per pre-operative assessment  Difficulty Due To: Difficult Airway- due to anterior larynx, Difficult Airway- due to limited oral opening and Difficult Airway- due to large tongue Future Recommendations: Recommend- induction with short-acting agent, and alternative techniques readily available

## 2018-12-12 ENCOUNTER — Encounter: Payer: Self-pay | Admitting: Surgery

## 2018-12-12 ENCOUNTER — Ambulatory Visit (HOSPITAL_COMMUNITY)
Admission: AD | Admit: 2018-12-12 | Discharge: 2018-12-12 | Disposition: A | Discharge status: Home / Self Care | Payer: 59 | Source: Other Acute Inpatient Hospital | Attending: Surgery | Admitting: Surgery

## 2018-12-12 DIAGNOSIS — E1122 Type 2 diabetes mellitus with diabetic chronic kidney disease: Secondary | ICD-10-CM | POA: Diagnosis not present

## 2018-12-12 DIAGNOSIS — I2510 Atherosclerotic heart disease of native coronary artery without angina pectoris: Secondary | ICD-10-CM | POA: Diagnosis not present

## 2018-12-12 DIAGNOSIS — I10 Essential (primary) hypertension: Secondary | ICD-10-CM | POA: Diagnosis not present

## 2018-12-12 DIAGNOSIS — J811 Chronic pulmonary edema: Secondary | ICD-10-CM | POA: Diagnosis not present

## 2018-12-12 DIAGNOSIS — N7689 Other specified inflammation of vagina and vulva: Secondary | ICD-10-CM | POA: Insufficient documentation

## 2018-12-12 DIAGNOSIS — E878 Other disorders of electrolyte and fluid balance, not elsewhere classified: Secondary | ICD-10-CM | POA: Diagnosis not present

## 2018-12-12 DIAGNOSIS — E1165 Type 2 diabetes mellitus with hyperglycemia: Secondary | ICD-10-CM | POA: Diagnosis not present

## 2018-12-12 DIAGNOSIS — R0602 Shortness of breath: Secondary | ICD-10-CM | POA: Diagnosis not present

## 2018-12-12 DIAGNOSIS — I129 Hypertensive chronic kidney disease with stage 1 through stage 4 chronic kidney disease, or unspecified chronic kidney disease: Secondary | ICD-10-CM | POA: Diagnosis not present

## 2018-12-12 DIAGNOSIS — E119 Type 2 diabetes mellitus without complications: Secondary | ICD-10-CM | POA: Diagnosis not present

## 2018-12-12 DIAGNOSIS — M726 Necrotizing fasciitis: Secondary | ICD-10-CM | POA: Diagnosis not present

## 2018-12-12 DIAGNOSIS — I96 Gangrene, not elsewhere classified: Secondary | ICD-10-CM | POA: Diagnosis not present

## 2018-12-12 LAB — CBC
HCT: 23.4 % — ABNORMAL LOW (ref 36.0–46.0)
Hemoglobin: 7.6 g/dL — ABNORMAL LOW (ref 12.0–15.0)
MCH: 28.4 pg (ref 26.0–34.0)
MCHC: 32.5 g/dL (ref 30.0–36.0)
MCV: 87.3 fL (ref 80.0–100.0)
Platelets: 223 10*3/uL (ref 150–400)
RBC: 2.68 MIL/uL — ABNORMAL LOW (ref 3.87–5.11)
RDW: 15.2 % (ref 11.5–15.5)
WBC: 21.2 10*3/uL — ABNORMAL HIGH (ref 4.0–10.5)
nRBC: 0 % (ref 0.0–0.2)

## 2018-12-12 LAB — COMPREHENSIVE METABOLIC PANEL
ALT: 34 U/L (ref 0–44)
AST: 29 U/L (ref 15–41)
Albumin: 2.3 g/dL — ABNORMAL LOW (ref 3.5–5.0)
Alkaline Phosphatase: 88 U/L (ref 38–126)
Anion gap: 8 (ref 5–15)
BILIRUBIN TOTAL: 0.6 mg/dL (ref 0.3–1.2)
BUN: 46 mg/dL — ABNORMAL HIGH (ref 6–20)
CO2: 21 mmol/L — ABNORMAL LOW (ref 22–32)
CREATININE: 2.64 mg/dL — AB (ref 0.44–1.00)
Calcium: 7.7 mg/dL — ABNORMAL LOW (ref 8.9–10.3)
Chloride: 107 mmol/L (ref 98–111)
GFR calc Af Amer: 22 mL/min — ABNORMAL LOW (ref >60)
GFR calc non Af Amer: 19 mL/min — ABNORMAL LOW (ref >60)
Glucose, Bld: 320 mg/dL — ABNORMAL HIGH (ref 70–99)
Potassium: 3.4 mmol/L — ABNORMAL LOW (ref 3.5–5.1)
Sodium: 136 mmol/L (ref 135–145)
Total Protein: 5.7 g/dL — ABNORMAL LOW (ref 6.5–8.1)

## 2018-12-12 LAB — PHOSPHORUS: Phosphorus: 4.6 mg/dL (ref 2.5–4.6)

## 2018-12-12 LAB — GLUCOSE, CAPILLARY: Glucose-Capillary: 281 mg/dL — ABNORMAL HIGH (ref 70–99)

## 2018-12-12 LAB — MAGNESIUM: Magnesium: 1.8 mg/dL (ref 1.7–2.4)

## 2018-12-12 NOTE — Progress Notes (Signed)
Patient will be transferred to [facility] and to room 2702. [facility] and gave report to [facility], the nurse that will be assigned to patient.

## 2018-12-12 NOTE — Progress Notes (Signed)
Patient transported off unit via [facility]. Patient denies pain and in no distress.

## 2018-12-13 MED ORDER — GENERIC EXTERNAL MEDICATION
5.00 | Status: DC
Start: ? — End: 2018-12-13

## 2018-12-13 MED ORDER — FUROSEMIDE 40 MG PO TABS
40.00 | ORAL_TABLET | ORAL | Status: DC
Start: 2018-12-15 — End: 2018-12-13

## 2018-12-13 MED ORDER — INSULIN REGULAR HUMAN 100 UNIT/ML IJ SOLN
0.00 | INTRAMUSCULAR | Status: DC
Start: 2018-12-14 — End: 2018-12-13

## 2018-12-13 MED ORDER — ATORVASTATIN CALCIUM 40 MG PO TABS
40.00 | ORAL_TABLET | ORAL | Status: DC
Start: 2018-12-14 — End: 2018-12-13

## 2018-12-13 MED ORDER — ONDANSETRON HCL 4 MG/2ML IJ SOLN
4.00 | INTRAMUSCULAR | Status: DC
Start: ? — End: 2018-12-13

## 2018-12-13 MED ORDER — INSULIN GLARGINE 100 UNIT/ML ~~LOC~~ SOLN
74.00 | SUBCUTANEOUS | Status: DC
Start: 2018-12-14 — End: 2018-12-13

## 2018-12-13 MED ORDER — METOPROLOL TARTRATE 25 MG PO TABS
12.50 | ORAL_TABLET | ORAL | Status: DC
Start: 2018-12-13 — End: 2018-12-13

## 2018-12-13 MED ORDER — HEPARIN SODIUM (PORCINE) 10000 UNIT/ML IJ SOLN
7500.00 | INTRAMUSCULAR | Status: DC
Start: 2018-12-14 — End: 2018-12-13

## 2018-12-13 MED ORDER — GENERIC EXTERNAL MEDICATION
1.00 | Status: DC
Start: 2018-12-14 — End: 2018-12-13

## 2018-12-13 MED ORDER — METRONIDAZOLE IN NACL 5-0.79 MG/ML-% IV SOLN
500.00 | INTRAVENOUS | Status: DC
Start: 2018-12-14 — End: 2018-12-13

## 2018-12-13 MED ORDER — DOCUSATE SODIUM 100 MG PO CAPS
100.00 | ORAL_CAPSULE | ORAL | Status: DC
Start: 2018-12-14 — End: 2018-12-13

## 2018-12-13 MED ORDER — POLYETHYLENE GLYCOL 3350 17 G PO PACK
17.00 | PACK | ORAL | Status: DC
Start: 2018-12-15 — End: 2018-12-13

## 2018-12-13 MED ORDER — ACETAMINOPHEN 500 MG PO TABS
1000.00 | ORAL_TABLET | ORAL | Status: DC
Start: 2018-12-14 — End: 2018-12-13

## 2018-12-14 LAB — GLUCOSE, CAPILLARY: Glucose-Capillary: 320 mg/dL — ABNORMAL HIGH (ref 70–99)

## 2018-12-14 LAB — SURGICAL PATHOLOGY

## 2018-12-14 MED ORDER — LOSARTAN POTASSIUM 100 MG PO TABS
100.00 | ORAL_TABLET | ORAL | Status: DC
Start: 2018-12-15 — End: 2018-12-14

## 2018-12-14 MED ORDER — INSULIN LISPRO 100 UNIT/ML ~~LOC~~ SOLN
15.00 | SUBCUTANEOUS | Status: DC
Start: 2018-12-15 — End: 2018-12-14

## 2018-12-14 MED ORDER — ALLOPURINOL 100 MG PO TABS
300.00 | ORAL_TABLET | ORAL | Status: DC
Start: 2018-12-15 — End: 2018-12-14

## 2018-12-14 MED ORDER — AMLODIPINE BESYLATE 10 MG PO TABS
10.00 | ORAL_TABLET | ORAL | Status: DC
Start: 2018-12-14 — End: 2018-12-14

## 2018-12-14 MED ORDER — INSULIN LISPRO 100 UNIT/ML ~~LOC~~ SOLN
25.00 | SUBCUTANEOUS | Status: DC
Start: 2018-12-15 — End: 2018-12-14

## 2018-12-14 MED ORDER — INSULIN LISPRO 100 UNIT/ML ~~LOC~~ SOLN
30.00 | SUBCUTANEOUS | Status: DC
Start: 2018-12-14 — End: 2018-12-14

## 2018-12-14 MED ORDER — ALLOPURINOL 100 MG PO TABS
200.00 | ORAL_TABLET | ORAL | Status: DC
Start: 2018-12-14 — End: 2018-12-14

## 2018-12-14 MED ORDER — GENERIC EXTERNAL MEDICATION
1250.00 | Status: DC
Start: 2018-12-15 — End: 2018-12-14

## 2018-12-14 MED ORDER — METOPROLOL TARTRATE 25 MG PO TABS
25.00 | ORAL_TABLET | ORAL | Status: DC
Start: 2018-12-14 — End: 2018-12-14

## 2018-12-15 LAB — AEROBIC CULTURE W GRAM STAIN (SUPERFICIAL SPECIMEN)
Culture: NORMAL
Gram Stain: NONE SEEN

## 2018-12-17 LAB — AEROBIC/ANAEROBIC CULTURE W GRAM STAIN (SURGICAL/DEEP WOUND)
Culture: NORMAL
Gram Stain: NONE SEEN

## 2018-12-17 LAB — AEROBIC/ANAEROBIC CULTURE (SURGICAL/DEEP WOUND)

## 2018-12-29 DIAGNOSIS — G4733 Obstructive sleep apnea (adult) (pediatric): Secondary | ICD-10-CM | POA: Insufficient documentation

## 2018-12-29 DIAGNOSIS — Z9989 Dependence on other enabling machines and devices: Secondary | ICD-10-CM | POA: Insufficient documentation

## 2019-06-11 ENCOUNTER — Other Ambulatory Visit: Payer: Self-pay | Admitting: Internal Medicine

## 2019-06-11 DIAGNOSIS — Z1231 Encounter for screening mammogram for malignant neoplasm of breast: Secondary | ICD-10-CM

## 2019-07-17 ENCOUNTER — Ambulatory Visit
Admission: RE | Admit: 2019-07-17 | Discharge: 2019-07-17 | Disposition: A | Discharge status: Home / Self Care | Payer: Medicare Other | Source: Ambulatory Visit | Attending: Internal Medicine | Admitting: Internal Medicine

## 2019-07-17 DIAGNOSIS — Z1231 Encounter for screening mammogram for malignant neoplasm of breast: Secondary | ICD-10-CM

## 2019-07-21 ENCOUNTER — Inpatient Hospital Stay
Admission: EM | Admit: 2019-07-21 | Discharge: 2019-07-22 | DRG: 149 | Disposition: A | Discharge status: Home / Self Care | Payer: Medicare Other | Attending: Internal Medicine | Admitting: Internal Medicine

## 2019-07-21 ENCOUNTER — Emergency Department: Payer: Medicare Other

## 2019-07-21 ENCOUNTER — Inpatient Hospital Stay: Payer: Medicare Other

## 2019-07-21 ENCOUNTER — Other Ambulatory Visit: Payer: Self-pay

## 2019-07-21 DIAGNOSIS — I639 Cerebral infarction, unspecified: Secondary | ICD-10-CM

## 2019-07-21 DIAGNOSIS — Z7982 Long term (current) use of aspirin: Secondary | ICD-10-CM

## 2019-07-21 DIAGNOSIS — H8110 Benign paroxysmal vertigo, unspecified ear: Principal | ICD-10-CM | POA: Diagnosis present

## 2019-07-21 DIAGNOSIS — Z20828 Contact with and (suspected) exposure to other viral communicable diseases: Secondary | ICD-10-CM | POA: Diagnosis present

## 2019-07-21 DIAGNOSIS — E11319 Type 2 diabetes mellitus with unspecified diabetic retinopathy without macular edema: Secondary | ICD-10-CM | POA: Diagnosis present

## 2019-07-21 DIAGNOSIS — E1122 Type 2 diabetes mellitus with diabetic chronic kidney disease: Secondary | ICD-10-CM

## 2019-07-21 DIAGNOSIS — I129 Hypertensive chronic kidney disease with stage 1 through stage 4 chronic kidney disease, or unspecified chronic kidney disease: Secondary | ICD-10-CM | POA: Diagnosis present

## 2019-07-21 DIAGNOSIS — R42 Dizziness and giddiness: Secondary | ICD-10-CM

## 2019-07-21 DIAGNOSIS — Z794 Long term (current) use of insulin: Secondary | ICD-10-CM | POA: Diagnosis not present

## 2019-07-21 DIAGNOSIS — N183 Chronic kidney disease, stage 3 (moderate): Secondary | ICD-10-CM | POA: Diagnosis present

## 2019-07-21 DIAGNOSIS — E785 Hyperlipidemia, unspecified: Secondary | ICD-10-CM | POA: Diagnosis present

## 2019-07-21 DIAGNOSIS — I340 Nonrheumatic mitral (valve) insufficiency: Secondary | ICD-10-CM | POA: Diagnosis not present

## 2019-07-21 DIAGNOSIS — E1140 Type 2 diabetes mellitus with diabetic neuropathy, unspecified: Secondary | ICD-10-CM | POA: Diagnosis present

## 2019-07-21 DIAGNOSIS — N184 Chronic kidney disease, stage 4 (severe): Secondary | ICD-10-CM

## 2019-07-21 DIAGNOSIS — I361 Nonrheumatic tricuspid (valve) insufficiency: Secondary | ICD-10-CM | POA: Diagnosis not present

## 2019-07-21 LAB — BASIC METABOLIC PANEL
Anion gap: 13 (ref 5–15)
BUN: 58 mg/dL — ABNORMAL HIGH (ref 6–20)
CO2: 22 mmol/L (ref 22–32)
Calcium: 9.4 mg/dL (ref 8.9–10.3)
Chloride: 104 mmol/L (ref 98–111)
Creatinine, Ser: 2.07 mg/dL — ABNORMAL HIGH (ref 0.44–1.00)
GFR calc Af Amer: 30 mL/min — ABNORMAL LOW (ref >60)
GFR calc non Af Amer: 26 mL/min — ABNORMAL LOW (ref >60)
Glucose, Bld: 346 mg/dL — ABNORMAL HIGH (ref 70–99)
Potassium: 4 mmol/L (ref 3.5–5.1)
Sodium: 139 mmol/L (ref 135–145)

## 2019-07-21 LAB — CBC
HCT: 32.4 % — ABNORMAL LOW (ref 36.0–46.0)
Hemoglobin: 10.8 g/dL — ABNORMAL LOW (ref 12.0–15.0)
MCH: 28.8 pg (ref 26.0–34.0)
MCHC: 33.3 g/dL (ref 30.0–36.0)
MCV: 86.4 fL (ref 80.0–100.0)
Platelets: 287 10*3/uL (ref 150–400)
RBC: 3.75 MIL/uL — ABNORMAL LOW (ref 3.87–5.11)
RDW: 14.5 % (ref 11.5–15.5)
WBC: 10.9 10*3/uL — ABNORMAL HIGH (ref 4.0–10.5)
nRBC: 0 % (ref 0.0–0.2)

## 2019-07-21 LAB — SARS CORONAVIRUS 2 (TAT 6-24 HRS): SARS Coronavirus 2: NEGATIVE

## 2019-07-21 LAB — GLUCOSE, CAPILLARY
Glucose-Capillary: 274 mg/dL — ABNORMAL HIGH (ref 70–99)
Glucose-Capillary: 261 mg/dL — ABNORMAL HIGH (ref 70–99)

## 2019-07-21 MED ORDER — AMLODIPINE BESYLATE 10 MG PO TABS
10.0000 mg | ORAL_TABLET | Freq: Every day | ORAL | Status: DC
  Administered 2019-07-21: 10 mg via ORAL
  Filled 2019-07-21: qty 1

## 2019-07-21 MED ORDER — HEPARIN SODIUM (PORCINE) 5000 UNIT/ML IJ SOLN
5000.0000 [IU] | Freq: Three times a day (TID) | INTRAMUSCULAR | Status: DC
  Administered 2019-07-21 – 2019-07-22 (×2): 5000 [IU] via SUBCUTANEOUS
  Filled 2019-07-21 (×3): qty 1

## 2019-07-21 MED ORDER — ALLOPURINOL 100 MG PO TABS
200.0000 mg | ORAL_TABLET | Freq: Every day | ORAL | Status: DC
  Administered 2019-07-21: 200 mg via ORAL
  Filled 2019-07-21: qty 2

## 2019-07-21 MED ORDER — ASPIRIN EC 81 MG PO TBEC
81.0000 mg | DELAYED_RELEASE_TABLET | ORAL | Status: DC
  Administered 2019-07-22: 06:00:00 81 mg via ORAL
  Filled 2019-07-21: qty 1

## 2019-07-21 MED ORDER — ALLOPURINOL 100 MG PO TABS
300.0000 mg | ORAL_TABLET | Freq: Every day | ORAL | Status: DC
  Administered 2019-07-22: 09:00:00 300 mg via ORAL
  Filled 2019-07-21: qty 3

## 2019-07-21 MED ORDER — ACETAMINOPHEN 325 MG PO TABS: 650.0000 mg | ORAL_TABLET | Freq: Three times a day (TID) | ORAL | Status: DC | PRN

## 2019-07-21 MED ORDER — ACETAMINOPHEN 650 MG RE SUPP: 650.0000 mg | RECTAL | Status: DC | PRN

## 2019-07-21 MED ORDER — INSULIN ASPART 100 UNIT/ML ~~LOC~~ SOLN
0.0000 [IU] | Freq: Every day | SUBCUTANEOUS | Status: DC
  Administered 2019-07-21: 3 [IU] via SUBCUTANEOUS
  Filled 2019-07-21: qty 1

## 2019-07-21 MED ORDER — CLOPIDOGREL BISULFATE 75 MG PO TABS
75.0000 mg | ORAL_TABLET | Freq: Every day | ORAL | Status: DC
  Administered 2019-07-21 – 2019-07-22 (×2): 75 mg via ORAL
  Filled 2019-07-21 (×2): qty 1

## 2019-07-21 MED ORDER — ASPIRIN 81 MG PO CHEW
324.0000 mg | CHEWABLE_TABLET | Freq: Once | ORAL | Status: AC
  Administered 2019-07-21: 13:00:00 324 mg via ORAL
  Filled 2019-07-21: qty 4

## 2019-07-21 MED ORDER — MECLIZINE HCL 25 MG PO TABS
50.0000 mg | ORAL_TABLET | Freq: Once | ORAL | Status: AC
  Administered 2019-07-21: 12:00:00 50 mg via ORAL
  Filled 2019-07-21: qty 2

## 2019-07-21 MED ORDER — ACETAMINOPHEN 325 MG PO TABS: 650.0000 mg | ORAL_TABLET | ORAL | Status: DC | PRN

## 2019-07-21 MED ORDER — METOPROLOL TARTRATE 50 MG PO TABS
50.0000 mg | ORAL_TABLET | Freq: Two times a day (BID) | ORAL | Status: DC
  Administered 2019-07-21 – 2019-07-22 (×2): 50 mg via ORAL
  Filled 2019-07-21 (×2): qty 1

## 2019-07-21 MED ORDER — STROKE: EARLY STAGES OF RECOVERY BOOK
Freq: Once | Status: AC
  Administered 2019-07-21: 19:00:00

## 2019-07-21 MED ORDER — SODIUM CHLORIDE 0.9 % IV SOLN
Freq: Once | INTRAVENOUS | Status: AC
  Administered 2019-07-21: 19:00:00 via INTRAVENOUS

## 2019-07-21 MED ORDER — INSULIN GLARGINE 100 UNIT/ML ~~LOC~~ SOLN
80.0000 [IU] | Freq: Every day | SUBCUTANEOUS | Status: DC
  Administered 2019-07-21: 22:00:00 80 [IU] via SUBCUTANEOUS
  Filled 2019-07-21 (×2): qty 0.8

## 2019-07-21 MED ORDER — ACETAMINOPHEN 160 MG/5ML PO SOLN
650.0000 mg | ORAL | Status: DC | PRN
  Filled 2019-07-21: qty 20.3

## 2019-07-21 MED ORDER — SODIUM CHLORIDE 0.9 % IV BOLUS
1000.0000 mL | Freq: Once | INTRAVENOUS | Status: AC
  Administered 2019-07-21: 12:00:00 1000 mL via INTRAVENOUS

## 2019-07-21 MED ORDER — MECLIZINE HCL 12.5 MG PO TABS
12.5000 mg | ORAL_TABLET | Freq: Two times a day (BID) | ORAL | Status: DC | PRN
  Administered 2019-07-21: 12.5 mg via ORAL
  Filled 2019-07-21 (×2): qty 1

## 2019-07-21 MED ORDER — ATORVASTATIN CALCIUM 20 MG PO TABS
40.0000 mg | ORAL_TABLET | Freq: Every day | ORAL | Status: DC
  Administered 2019-07-21: 22:00:00 40 mg via ORAL
  Filled 2019-07-21: qty 2

## 2019-07-21 MED ORDER — INSULIN ASPART 100 UNIT/ML ~~LOC~~ SOLN
0.0000 [IU] | Freq: Three times a day (TID) | SUBCUTANEOUS | Status: DC
  Administered 2019-07-21 – 2019-07-22 (×2): 5 [IU] via SUBCUTANEOUS
  Administered 2019-07-22: 09:00:00 2 [IU] via SUBCUTANEOUS
  Filled 2019-07-21 (×3): qty 1

## 2019-07-21 NOTE — ED Notes (Signed)
ED TO INPATIENT HANDOFF REPORT  ED Nurse Name and Phone #: Vylet Maffia [phone]  S Name/Age/Gender Sharon Arias 58 y.o. female Room/Bed: ED02A/ED02A  Code Status   Code Status: Prior  Home/SNF/Other Home Patient oriented to: self, place, time and situation Is this baseline? Yes   Triage Complete: Triage complete  Chief Complaint Dizziness  Triage Note Pt arrives via EMS after being dizzy since last night- pt says its only when she moves that she is dizzy- this has happened before about a year ago and she cannot remember the diagnosis- pt states she fell to the floor with no LOC today   Allergies No Known Allergies  Level of Care/Admitting Diagnosis ED Disposition    ED Disposition Condition [place]: [place] [100120]  Level of Care: Med-Surg [16]  Covid Evaluation: N/A  Diagnosis: Acute CVA (cerebrovascular accident) [facility]) [[id number]]  Admitting Physician: Odessa Fleming  Attending Physician: Odessa Fleming  Estimated length of stay: past midnight tomorrow  Certification:: I certify this patient will need inpatient services for at least 2 midnights  PT Class (Do Not Modify): Inpatient [101]  PT Acc Code (Do Not Modify): Private [1]       B Medical/Surgery History Past Medical History:  Diagnosis Date  . Anemia in chronic kidney disease 05/19/2016  . CAD (coronary artery disease)   . Chicken pox   . Chronic kidney disease    per dr sparks  . CVA (cerebral vascular accident) ([place])   . Detached retina   . Diabetes mellitus without complication ([place])    type 2  . Diabetic neuropathy ([place])   . Diabetic retinopathy ([place])    legally blind  . Hyperlipidemia   . Hypertension   . Myocardial infarction ([place])   . Obesity   . PONV (postoperative nausea and vomiting)   . Sciatica of right side    going to see physiciatry   Past Surgical History:  Procedure Laterality Date  . COLONOSCOPY WITH PROPOFOL N/A 01/26/2016    Procedure: COLONOSCOPY WITH PROPOFOL;  Surgeon: Lollie Sails, MD;  Location: [facility] ENDOSCOPY;  Service: Endoscopy;  Laterality: N/A;  . COLONOSCOPY WITH PROPOFOL N/A 01/27/2016   Procedure: COLONOSCOPY WITH PROPOFOL;  Surgeon: Lollie Sails, MD;  Location: [facility] ENDOSCOPY;  Service: Endoscopy;  Laterality: N/A;  . CORONARY ARTERY BYPASS GRAFT  2013  . INCISION AND DRAINAGE     chest abscess  . INCISION AND DRAINAGE ABSCESS N/A 12/11/2018   Procedure: INCISION AND DRAINAGE PERINEAL;  Surgeon: Jules Husbands, MD;  Location: [facility] ORS;  Service: General;  Laterality: N/A;  . RETINAL LASER PROCEDURE       A IV Location/Drains/Wounds Patient Lines/Drains/Airways Status   Active Line/Drains/Airways    Name:   Placement date:   Placement time:   Site:   Days:   Peripheral IV 07/21/19 Left Antecubital   07/21/19    1118    Antecubital   less than 1   Urethral Catheter [facility] Straight-tip 16 Fr.   12/12/18    0130    Straight-tip   221   Incision (Closed) 12/11/18 Perineum   12/11/18    1757     222          Intake/Output Last 24 hours No intake or output data in the 24 hours ending 07/21/19 1515  Labs/Imaging Results for orders placed or performed during the hospital encounter of 07/21/19 (from the past 48 hour(s))  Basic metabolic panel     Status: Abnormal   Collection Time: 07/21/19 11:17 AM  Result Value Ref Range   Sodium 139 135 - 145 mmol/L   Potassium 4.0 3.5 - 5.1 mmol/L   Chloride 104 98 - 111 mmol/L   CO2 22 22 - 32 mmol/L   Glucose, Bld 346 (H) 70 - 99 mg/dL   BUN 58 (H) 6 - 20 mg/dL   Creatinine, Ser 2.07 (H) 0.44 - 1.00 mg/dL   Calcium 9.4 8.9 - 10.3 mg/dL   GFR calc non Af Amer 26 (L) >60 mL/min   GFR calc Af Amer 30 (L) >60 mL/min   Anion gap 13 5 - 15    Comment: Performed at [facility], [address] [place], [place] [address]  CBC     Status: Abnormal   Collection Time: 07/21/19 11:17 AM  Result Value Ref Range   WBC 10.9 (H) 4.0 -  10.5 K/uL   RBC 3.75 (L) 3.87 - 5.11 MIL/uL   Hemoglobin 10.8 (L) 12.0 - 15.0 g/dL   HCT 32.4 (L) 36.0 - 46.0 %   MCV 86.4 80.0 - 100.0 fL   MCH 28.8 26.0 - 34.0 pg   MCHC 33.3 30.0 - 36.0 g/dL   RDW 14.5 11.5 - 15.5 %   Platelets 287 150 - 400 K/uL   nRBC 0.0 0.0 - 0.2 %    Comment: Performed at [facility], [address], [place], [place] [address]   Ct Head Wo Contrast  Result Date: 07/21/2019 CLINICAL DATA:  Vertigo. EXAM: CT HEAD WITHOUT CONTRAST TECHNIQUE: Contiguous axial images were obtained from the base of the skull through the vertex without intravenous contrast. COMPARISON:  June 19, 2011 FINDINGS: Brain: There is low-density with loss of gray-white differentiation of the right parietal lobe consistent with acute to subacute infarct. There is no midline shift or hydrocephalus. No acute hemorrhage is noted. Small low densities identified in the left cerebral melanoma unchanged compared prior exam, chronic. Vascular: No hyperdense vessel is noted. Skull: Normal. Negative for fracture or focal lesion. Sinuses/Orbits: No acute findings Other: None IMPRESSION: Right parietal lobe acute to subacute infarct. No acute hemorrhage is noted. These results will be called to the ordering clinician or representative by the Radiologist Assistant, and communication documented in the PACS or zVision Dashboard. Electronically Signed   By: Abelardo Diesel M.D.   On: 07/21/2019 12:01    Pending Labs Unresulted Labs (From admission, onward)    Start     Ordered   07/21/19 1224  SARS CORONAVIRUS 2 Nasal Swab Aptima Multi Swab  (Asymptomatic Patients Labs)  ONCE - STAT,   STAT    Question Answer Comment  Is this test for diagnosis or screening Screening   Symptomatic for COVID-19 as defined by CDC No   Hospitalized for COVID-19 No   Admitted to ICU for COVID-19 No   Previously tested for COVID-19 No   Resident in a congregate (group) care setting No   Employed in healthcare setting No    Pregnant No      07/21/19 1223   07/21/19 1116  Urinalysis, Complete w Microscopic  ONCE - STAT,   STAT     07/21/19 1116   Signed and Held  Hemoglobin A1c  Tomorrow morning,   R     Signed and Held   Signed and Held  Lipid panel  Tomorrow morning,   R    Comments: Fasting    Signed  and Held   Signed and Held  CBC  (heparin)  Once,   R    Comments: Baseline for heparin therapy IF NOT ALREADY DRAWN.  Notify MD if PLT < 100 K.    Signed and Held   Signed and Held  Creatinine, serum  (heparin)  Once,   R    Comments: Baseline for heparin therapy IF NOT ALREADY DRAWN.    Signed and Held          Vitals/Pain Today's Vitals   07/21/19 1200 07/21/19 1230 07/21/19 1300 07/21/19 1330  BP: (!) 128/50 (!) 149/56 (!) 145/51 (!) 148/76  Pulse: 66 75 72 79  Resp:      Temp:      TempSrc:      SpO2: 97% 97% 97% 97%  Weight:      Height:      PainSc:        Isolation Precautions No active isolations  Medications Medications  insulin aspart (novoLOG) injection 0-9 Units (has no administration in time range)  insulin aspart (novoLOG) injection 0-5 Units (has no administration in time range)  clopidogrel (PLAVIX) tablet 75 mg (has no administration in time range)  meclizine (ANTIVERT) tablet 12.5 mg (has no administration in time range)  meclizine (ANTIVERT) tablet 50 mg (50 mg Oral Given 07/21/19 1201)  sodium chloride 0.9 % bolus 1,000 mL (1,000 mLs Intravenous New Bag/Given 07/21/19 1201)  aspirin chewable tablet 324 mg (324 mg Oral Given 07/21/19 1301)    Mobility walks Low fall risk   Focused Assessments Cardiac Assessment Handoff:    No results found for: CKTOTAL, CKMB, CKMBINDEX, TROPONINI No results found for: DDIMER Does the Patient currently have chest pain? No      R Recommendations: See Admitting Provider Note  Report given to:   Additional Notes:

## 2019-07-21 NOTE — ED Notes (Signed)
Attempted to call report- was told I would get a call back in about 15 minutes

## 2019-07-21 NOTE — ED Notes (Signed)
Report given to Robin, RN.

## 2019-07-21 NOTE — ED Notes (Signed)
Admitting Dr at bedside

## 2019-07-21 NOTE — Progress Notes (Signed)
Family Meeting Note  Advance Directive no Today a meeting took place with the patient in the ER patient came in with dizziness and falls without any trauma. Found to have acute stroke right parietal. Has risk factors with hypertension diabetes and CKD stage III. Discuss code status. Patient says she is full code. She is still young and would like to be full code. Time spent 16 minutes    Fritzi Mandes, MD

## 2019-07-21 NOTE — ED Triage Notes (Signed)
Pt arrives via EMS after being dizzy since last night- pt says its only when she moves that she is dizzy- this has happened before about a year ago and she cannot remember the diagnosis- pt states she fell to the floor with no LOC today

## 2019-07-21 NOTE — Consult Note (Signed)
Reason for Consult:dizziness  Referring Physician: Dr. Posey Pronto   CC: dizziness   HPI: Sharon Arias is an 58 y.o. female history of type II diabetes on insulin, obesity, diabetic neuropathy, diabetic retinopathy, chronic kidney disease stage III due to diabetic nephropathy followed by Dr. Juleen China, CAD status post stent in the past comes to the emergency room after she had feeling of dizziness and and fall thereafter without any trauma. She does complain of positional dizziness worse when standing or moving.    Past Medical History:  Diagnosis Date  . Anemia in chronic kidney disease 05/19/2016  . CAD (coronary artery disease)   . Chicken pox   . Chronic kidney disease    per dr sparks  . CVA (cerebral vascular accident) ([place])   . Detached retina   . Diabetes mellitus without complication ([place])    type 2  . Diabetic neuropathy ([place])   . Diabetic retinopathy ([place])    legally blind  . Hyperlipidemia   . Hypertension   . Myocardial infarction ([place])   . Obesity   . PONV (postoperative nausea and vomiting)   . Sciatica of right side    going to see physiciatry    Past Surgical History:  Procedure Laterality Date  . COLONOSCOPY WITH PROPOFOL N/A 01/26/2016   Procedure: COLONOSCOPY WITH PROPOFOL;  Surgeon: Lollie Sails, MD;  Location: [facility] ENDOSCOPY;  Service: Endoscopy;  Laterality: N/A;  . COLONOSCOPY WITH PROPOFOL N/A 01/27/2016   Procedure: COLONOSCOPY WITH PROPOFOL;  Surgeon: Lollie Sails, MD;  Location: [facility] ENDOSCOPY;  Service: Endoscopy;  Laterality: N/A;  . CORONARY ARTERY BYPASS GRAFT  2013  . INCISION AND DRAINAGE     chest abscess  . INCISION AND DRAINAGE ABSCESS N/A 12/11/2018   Procedure: INCISION AND DRAINAGE PERINEAL;  Surgeon: Jules Husbands, MD;  Location: [facility] ORS;  Service: General;  Laterality: N/A;  . RETINAL LASER PROCEDURE      Family History  Problem Relation Age of Onset  . Breast cancer Mother   . Prostate cancer Father        we think mets to  liver and bone    Social History:  reports that she has never smoked. She has never used smokeless tobacco. She reports that she does not drink alcohol or use drugs.  No Known Allergies  Medications: I have reviewed the patient's current medications.  ROS: History obtained from the patient  General ROS: negative for - chills, fatigue, fever, night sweats, weight gain or weight loss Psychological ROS: negative for - behavioral disorder, hallucinations, memory difficulties, mood swings or suicidal ideation Ophthalmic ROS: negative for - blurry vision, double vision, eye pain or loss of vision ENT ROS: negative for - epistaxis, nasal discharge, oral lesions, sore throat, tinnitus or vertigo Allergy and Immunology ROS: negative for - hives or itchy/watery eyes Hematological and Lymphatic ROS: negative for - bleeding problems, bruising or swollen lymph nodes Endocrine ROS: negative for - galactorrhea, hair pattern changes, polydipsia/polyuria or temperature intolerance Respiratory ROS: negative for - cough, hemoptysis, shortness of breath or wheezing Cardiovascular ROS: negative for - chest pain, dyspnea on exertion, edema or irregular heartbeat Gastrointestinal ROS: negative for - abdominal pain, diarrhea, hematemesis, nausea/vomiting or stool incontinence Genito-Urinary ROS: negative for - dysuria, hematuria, incontinence or urinary frequency/urgency Musculoskeletal ROS: negative for - joint swelling or muscular weakness Neurological ROS: as noted in HPI Dermatological ROS: negative for rash and skin lesion changes  Physical Examination: Blood pressure (!) 183/77, pulse 84, temperature 98.2  F (36.8 C), temperature source Oral, resp. rate 20, height 5\' 8"  (1.727 m), weight 113.4 kg, SpO2 100 %.    Neurological Examination   Mental Status: Alert, oriented, thought content appropriate.  Speech fluent without evidence of aphasia.  Able to follow 3 step commands without  difficulty. Cranial Nerves: II: Discs flat bilaterally; Visual fields grossly normal, pupils equal, round, reactive to light and accommodation III,IV, VI: ptosis not present, extra-ocular motions intact bilaterally V,VII: smile symmetric, facial light touch sensation normal bilaterally VIII: hearing normal bilaterally IX,X: gag reflex present XI: bilateral shoulder shrug XII: midline tongue extension Motor: Right : Upper extremity   5/5    Left:     Upper extremity   5/5  Lower extremity   5/5     Lower extremity   5/5 Tone and bulk:normal tone throughout; no atrophy noted Sensory: Pinprick and light touch intact throughout, bilaterally Deep Tendon Reflexes: 2+ and symmetric throughout Plantars: Right: downgoing   Left: downgoing Cerebellar: normal finger-to-nose, normal rapid alternating movements and normal heel-to-shin test Gait: not tested      Laboratory Studies:   Basic Metabolic Panel: Recent Labs  Lab 07/21/19 1117  NA 139  K 4.0  CL 104  CO2 22  GLUCOSE 346*  BUN 58*  CREATININE 2.07*  CALCIUM 9.4    Liver Function Tests: No results for input(s): AST, ALT, ALKPHOS, BILITOT, PROT, ALBUMIN in the last 168 hours. No results for input(s): LIPASE, AMYLASE in the last 168 hours. No results for input(s): AMMONIA in the last 168 hours.  CBC: Recent Labs  Lab 07/21/19 1117  WBC 10.9*  HGB 10.8*  HCT 32.4*  MCV 86.4  PLT 287    Cardiac Enzymes: No results for input(s): CKTOTAL, CKMB, CKMBINDEX, TROPONINI in the last 168 hours.  BNP: Invalid input(s): POCBNP  CBG: Recent Labs  Lab 07/21/19 1843  GLUCAP 31*    Microbiology: Results for orders placed or performed during the hospital encounter of 12/11/18  Aerobic Culture (superficial specimen)     Status: None   Collection Time: 12/11/18  5:36 PM   Specimen: [place] Other; Wound  Result Value Ref Range Status   Specimen Description   Final    PERINEAL NECROTIZING SOFT TISSUE Performed at [facility], [address] [place], [place] [address]    Special Requests   Final    NONE Performed at [facility], [address] [place], [place] [address]    Gram Stain   Final    NO WBC SEEN ABUNDANT GRAM POSITIVE COCCI IN PAIRS FEW GRAM POSITIVE COCCOBACILLUS    Culture   Final    FEW NORMAL SKIN FLORA Performed at [facility], [place] [address] [place], [place] [address]    Report Status 12/15/2018 FINAL  Final  Aerobic/Anaerobic Culture (surgical/deep wound)     Status: None   Collection Time: 12/11/18  5:58 PM   Specimen: [facility] Other; Tissue  Result Value Ref Range Status   Specimen Description   Final    PERINEAL NECROTIZING SOFT TISSUE Performed at [facility], [address], [place], [place] [address]    Special Requests   Final    NONE Performed at [facility], [address] [place], [place] [address]    Gram Stain   Final    RARE WBC PRESENT, PREDOMINANTLY PMN ABUNDANT GRAM POSITIVE COCCI IN PAIRS MODERATE GRAM NEGATIVE COCCOBACILLI    Culture   Final    RARE  NORMAL SKIN FLORA FEW BACTEROIDES THETAIOTAOMICRON BETA LACTAMASE NEGATIVE Performed at [facility], [place] [address], [place], [place] [address]    Report Status 12/17/2018 FINAL  Final  Aerobic/Anaerobic Culture (surgical/deep wound)     Status: None   Collection Time: 12/11/18  6:03 PM   Specimen: [place] Other; Wound  Result Value Ref Range Status   Specimen Description   Final    PERINEAL NECROTIZING SOFT TISSUE Performed at [facility], [address], [place], [place] [address]    Special Requests   Final    NONE Performed at [facility], [address] [place], [place] [address]    Gram Stain   Final    NO WBC SEEN ABUNDANT GRAM POSITIVE COCCI IN PAIRS FEW GRAM NEGATIVE COCCOBACILLI    Culture   Final    FEW ACTINOMYCES SPECIES Standardized susceptibility testing for this organism is not available. FEW  BACTEROIDES THETAIOTAOMICRON BETA LACTAMASE NEGATIVE Performed at [facility], [place] [address] [place], [place] [address]    Report Status 12/17/2018 FINAL  Final    Coagulation Studies: No results for input(s): LABPROT, INR in the last 72 hours.  Urinalysis: No results for input(s): COLORURINE, LABSPEC, PHURINE, GLUCOSEU, HGBUR, BILIRUBINUR, KETONESUR, PROTEINUR, UROBILINOGEN, NITRITE, LEUKOCYTESUR in the last 168 hours.  Invalid input(s): APPERANCEUR  Lipid Panel:  No results found for: CHOL, TRIG, HDL, CHOLHDL, VLDL, LDLCALC  HgbA1C: No results found for: HGBA1C  Urine Drug Screen:  No results found for: LABOPIA, COCAINSCRNUR, LABBENZ, AMPHETMU, THCU, LABBARB  Alcohol Level: No results for input(s): ETH in the last 168 hours.  Other results: EKG: normal EKG, normal sinus rhythm, unchanged from previous tracings.  Imaging: Ct Head Wo Contrast  Result Date: 07/21/2019 CLINICAL DATA:  Vertigo. EXAM: CT HEAD WITHOUT CONTRAST TECHNIQUE: Contiguous axial images were obtained from the base of the skull through the vertex without intravenous contrast. COMPARISON:  June 19, 2011 FINDINGS: Brain: There is low-density with loss of gray-white differentiation of the right parietal lobe consistent with acute to subacute infarct. There is no midline shift or hydrocephalus. No acute hemorrhage is noted. Small low densities identified in the left cerebral melanoma unchanged compared prior exam, chronic. Vascular: No hyperdense vessel is noted. Skull: Normal. Negative for fracture or focal lesion. Sinuses/Orbits: No acute findings Other: None IMPRESSION: Right parietal lobe acute to subacute infarct. No acute hemorrhage is noted. These results will be called to the ordering clinician or representative by the Radiologist Assistant, and communication documented in the PACS or zVision Dashboard. Electronically Signed   By: Abelardo Diesel M.D.   On: 07/21/2019 12:01     Assessment/Plan:  59 y.o.  female history of type II diabetes on insulin, obesity, diabetic neuropathy, diabetic retinopathy, chronic kidney disease stage III due to diabetic nephropathy followed by Dr. Juleen China, CAD status post stent in the past comes to the emergency room after she had feeling of dizziness and and fall thereafter without any trauma. She does complain of positional dizziness worse when standing or moving.\  - CTH with R parietal stroke but on MRI I think its subacute - Agree with plavix - Meclizine/antivert as I think this is [place] postional vertigo - States she is better and improved - likely d/c in AM   07/21/2019, 7:00 PM

## 2019-07-21 NOTE — H&P (Signed)
[place] [facility] [place] NAME: Sharon Arias    MR#:  [id number]  DATE OF BIRTH:  08-May-1961  DATE OF ADMISSION:  07/21/2019  PRIMARY CARE PHYSICIAN: Idelle Crouch, MD   REQUESTING/REFERRING PHYSICIAN: Dr. Joni Fears  CHIEF COMPLAINT:   Dizziness persistent with fall yesterday HISTORY OF PRESENT ILLNESS:  Sharon Arias  is a 58 y.o. female with a known history of type II diabetes on insulin, obesity, diabetic neuropathy, diabetic retinopathy, chronic kidney disease stage III due to diabetic nephropathy followed by Dr. Juleen China, CAD status post stent in the past comes to the emergency room after she had feeling of dizziness and and fall thereafter without any trauma. Patient went to bed there after woke up this morning cannot lift her head up feeling very dizzy and lightheaded. She denies any focal weakness any slurred speech or difficulty swallowing. She denies any new vision problems.  In the ER, she underwent workup for stroke and CT had showed right acute subacute parietal infarct. Patient is still for hemodynamically stable. She received 325 mg of aspirin. She is being admitted for further evaluation management.  PAST MEDICAL HISTORY:   Past Medical History:  Diagnosis Date  . Anemia in chronic kidney disease 05/19/2016  . CAD (coronary artery disease)   . Chicken pox   . Chronic kidney disease    per dr sparks  . CVA (cerebral vascular accident) ([place])   . Detached retina   . Diabetes mellitus without complication ([place])    type 2  . Diabetic neuropathy ([place])   . Diabetic retinopathy ([place])    legally blind  . Hyperlipidemia   . Hypertension   . Myocardial infarction ([place])   . Obesity   . PONV (postoperative nausea and vomiting)   . Sciatica of right side    going to see physiciatry    PAST SURGICAL HISTOIRY:   Past Surgical History:  Procedure Laterality Date  . COLONOSCOPY WITH PROPOFOL N/A 01/26/2016   Procedure:  COLONOSCOPY WITH PROPOFOL;  Surgeon: Lollie Sails, MD;  Location: [facility] ENDOSCOPY;  Service: Endoscopy;  Laterality: N/A;  . COLONOSCOPY WITH PROPOFOL N/A 01/27/2016   Procedure: COLONOSCOPY WITH PROPOFOL;  Surgeon: Lollie Sails, MD;  Location: [facility] ENDOSCOPY;  Service: Endoscopy;  Laterality: N/A;  . CORONARY ARTERY BYPASS GRAFT  2013  . INCISION AND DRAINAGE     chest abscess  . INCISION AND DRAINAGE ABSCESS N/A 12/11/2018   Procedure: INCISION AND DRAINAGE PERINEAL;  Surgeon: Jules Husbands, MD;  Location: [facility] ORS;  Service: General;  Laterality: N/A;  . RETINAL LASER PROCEDURE      SOCIAL HISTORY:   Social History   Tobacco Use  . Smoking status: Never Smoker  . Smokeless tobacco: Never Used  Substance Use Topics  . Alcohol use: No    FAMILY HISTORY:   Family History  Problem Relation Age of Onset  . Breast cancer Mother   . Prostate cancer Father        we think mets to liver and bone    DRUG ALLERGIES:  No Known Allergies  REVIEW OF SYSTEMS:  Review of Systems  Constitutional: Negative for chills, fever and weight loss.  HENT: Negative for ear discharge, ear pain and nosebleeds.   Eyes: Negative for blurred vision, pain and discharge.  Respiratory: Negative for sputum production, shortness of breath, wheezing and stridor.   Cardiovascular: Negative for chest pain, palpitations, orthopnea and PND.  Gastrointestinal: Negative for abdominal  pain, diarrhea, nausea and vomiting.  Genitourinary: Negative for frequency and urgency.  Musculoskeletal: Positive for back pain. Negative for joint pain.  Neurological: Positive for dizziness. Negative for sensory change, speech change, focal weakness and weakness.  Psychiatric/Behavioral: Negative for depression and hallucinations. The patient is not nervous/anxious.      MEDICATIONS AT HOME:   Prior to Admission medications   Medication Sig Start Date End Date Taking? Authorizing Provider  acetaminophen (TYLENOL)  500 MG tablet Take 1,000 mg by mouth every 8 (eight) hours as needed for pain. 12/18/18  Yes [provider]  allopurinol (ZYLOPRIM) 100 MG tablet Take 200 mg by mouth at bedtime. 05/15/19  Yes [provider]  allopurinol (ZYLOPRIM) 300 MG tablet Take 300 mg by mouth daily. 05/15/19  Yes [provider]  amLODipine (NORVASC) 10 MG tablet Take 10 mg by mouth at bedtime. 12/04/18  Yes [provider]  aspirin EC 81 MG tablet Take 81 mg by mouth every morning.   Yes [provider]  atorvastatin (LIPITOR) 40 MG tablet Take 40 mg by mouth at bedtime. 11/14/18  Yes [provider]  furosemide (LASIX) 40 MG tablet Take 40 mg by mouth 2 (two) times a day. 05/07/19  Yes [provider]  Insulin Glargine (LANTUS SOLOSTAR) 100 UNIT/ML Solostar Pen Inject 80 Units into the skin at bedtime. 10/07/18  Yes [provider]  insulin lispro (HUMALOG) 100 UNIT/ML KwikPen Inject 15-30 Units into the skin 3 (three) times daily. 10/02/18  Yes [provider]  losartan (COZAAR) 100 MG tablet Take 100 mg by mouth daily. 09/27/18  Yes [provider]  metoprolol tartrate (LOPRESSOR) 50 MG tablet Take 50 mg by mouth 2 (two) times a day. 11/14/18  Yes [provider]      VITAL SIGNS:  Blood pressure (!) 148/76, pulse 79, temperature 97.7 F (36.5 C), temperature source Oral, resp. rate 12, height 5\' 8"  (1.727 m), weight 113.4 kg, SpO2 97 %.  PHYSICAL EXAMINATION:  GENERAL:  58 y.o.-year-old patient lying in the bed with no acute distress. Morbid obesity EYES: Pupils equal, round, reactive to light and accommodation. No scleral icterus. Extraocular muscles intact.  HEENT: Head atraumatic, normocephalic. Oropharynx and nasopharynx clear.  NECK:  Supple, no jugular venous distention. No thyroid enlargement, no tenderness.  LUNGS: Normal breath sounds bilaterally, no wheezing, rales,rhonchi or crepitation. No use of accessory  muscles of respiration.  CARDIOVASCULAR: S1, S2 normal. No murmurs, rubs, or gallops.  ABDOMEN: Soft, nontender, nondistended. Bowel sounds present. No organomegaly or mass.  EXTREMITIES:++ chronic  pedal edema, no cyanosis, or clubbing.  NEUROLOGIC: Cranial nerves II through XII are intact. Muscle strength 4/5 in all extremities. Sensation intact. Gait not checked.  PSYCHIATRIC: The patient is alert and oriented x 3.  SKIN: No obvious rash, lesion, or ulcer.   LABORATORY PANEL:   CBC Recent Labs  Lab 07/21/19 1117  WBC 10.9*  HGB 10.8*  HCT 32.4*  PLT 287   ------------------------------------------------------------------------------------------------------------------  Chemistries  Recent Labs  Lab 07/21/19 1117  NA 139  K 4.0  CL 104  CO2 22  GLUCOSE 346*  BUN 58*  CREATININE 2.07*  CALCIUM 9.4   ------------------------------------------------------------------------------------------------------------------  Cardiac Enzymes No results for input(s): TROPONINI in the last 168 hours. ------------------------------------------------------------------------------------------------------------------  RADIOLOGY:  Ct Head Wo Contrast  Result Date: 07/21/2019 CLINICAL DATA:  Vertigo. EXAM: CT HEAD WITHOUT CONTRAST TECHNIQUE: Contiguous axial images were obtained from the base of the skull through the vertex without intravenous contrast.  COMPARISON:  June 19, 2011 FINDINGS: Brain: There is low-density with loss of gray-white differentiation of the right parietal lobe consistent with acute to subacute infarct. There is no midline shift or hydrocephalus. No acute hemorrhage is noted. Small low densities identified in the left cerebral melanoma unchanged compared prior exam, chronic. Vascular: No hyperdense vessel is noted. Skull: Normal. Negative for fracture or focal lesion. Sinuses/Orbits: No acute findings Other: None IMPRESSION: Right parietal lobe acute to subacute infarct.  No acute hemorrhage is noted. These results will be called to the ordering clinician or representative by the Radiologist Assistant, and communication documented in the PACS or zVision Dashboard. Electronically Signed   By: Abelardo Diesel M.D.   On: 07/21/2019 12:01    EKG:    IMPRESSION AND PLAN:   Sharon Arias  is a 58 y.o. female with a known history of type II diabetes on insulin, obesity, diabetic neuropathy, diabetic retinopathy, chronic kidney disease stage III due to diabetic nephropathy followed by Dr. Juleen China, CAD status post stent in the past comes to the emergency room after she had feeling of dizziness and and fall thereafter without any trauma  1. Acute right parietal infarct-- new -patient has multiple risk factor with CAD, hyperlipidemia, diabetes, hypertension -admit to [place] -follow [facility] protocol -continue aspirin 81 mg. I will add Plavix. -Continue statins -PT, OT, speech therapy -MRI of the brain, ultrasound carotid, echo of the heart -neurology consultation with Dr. Irish Elders. Message sent  2. Dizziness -PRN meclizine  3. Type II diabetes uncontrolled with retinopathy, nephropathy and and neuropathy -continue sliding scale insulin and Lantus  4. Hyperlipidemia continue statins  5. Hypertension -continue home meds. I am holding losartan will add tomorrow if blood pressure remains stable  6. Chronic kidney disease stage III secondary to diabetic nephropathy -creatinine stable at 2.07. Patient follows with Dr. Juleen China. -Avoid nephrotoxic agents -monitor input output  7. DVT prophylaxis subcu heparin  All the records are reviewed and case discussed with ED provider.   CODE STATUS: full  TOTAL TIME TAKING CARE OF THIS PATIENT: 50 minutes.    Fritzi Mandes M.D on 07/21/2019 at 1:40 PM  Between 7am to 6pm - Pager - [phone]  After 6pm go to [email or web address] - password [email or web address]  [facility]  Office  [phone]  CC: Primary care physician;  Idelle Crouch, MD

## 2019-07-21 NOTE — ED Provider Notes (Addendum)
[facility] Emergency Department Provider Note  ____________________________________________  Time seen: Approximately 11:35 AM  I have reviewed the triage vital signs and the nursing notes.   HISTORY  Chief Complaint Dizziness    HPI Sharon Arias is a 58 y.o. female with a history of CAD, CVA, hypertension diabetes who complains of dizziness that started last night.  Not sure if it is more like lightheadedness or vertigo.  First noticed it when she got out of bed in the middle the night to check on her granddaughter.  Shortly after standing up she lost her balance and fell to the floor.  She denies loss of consciousness.  No recent head injuries.  No prodromal symptoms such as chest pain shortness of breath back pain abdominal pain headache acute vision change or palpitations.  The episode lasted a few minutes and then she went back to bed after it seemed to resolve.  This morning on waking up, the symptoms had recurred before she got out of bed.  Worse with movement and turning her head.  Associated with right-sided neck pain that radiates to the right shoulder and is severe and constant.   Worse with movement, no alleviating factors     Past Medical History:  Diagnosis Date  . Anemia in chronic kidney disease 05/19/2016  . CAD (coronary artery disease)   . Chicken pox   . Chronic kidney disease    per dr sparks  . CVA (cerebral vascular accident) ([place])   . Detached retina   . Diabetes mellitus without complication ([place])    type 2  . Diabetic neuropathy ([place])   . Diabetic retinopathy ([place])    legally blind  . Hyperlipidemia   . Hypertension   . Myocardial infarction ([place])   . Obesity   . PONV (postoperative nausea and vomiting)   . Sciatica of right side    going to see physiciatry     Patient Active Problem List   Diagnosis Date Noted  . Necrotizing soft tissue infection 12/11/2018  . CAP (community acquired pneumonia) 03/18/2018  .  Hypocalcemia 03/18/2018  . Hypokalemia 03/18/2018  . CKD (chronic kidney disease), stage IV ([place]) 09/29/2016  . Chronic arthritis 09/29/2016  . Anemia in chronic kidney disease 05/19/2016  . DDD (degenerative disc disease), lumbar 01/13/2016  . Well controlled type 2 diabetes mellitus ([place]) 10/01/2015  . Proliferative diabetic retinopathy with macular edema associated with type 2 diabetes mellitus ([place]) 11/13/2014  . Coronary artery disease 08/17/2012  . Obesity 08/16/2012  . Myocardial infarction ([place]) 08/16/2012  . Hyperlipidemia, unspecified 08/16/2012  . HTN (hypertension) 08/16/2012     Past Surgical History:  Procedure Laterality Date  . COLONOSCOPY WITH PROPOFOL N/A 01/26/2016   Procedure: COLONOSCOPY WITH PROPOFOL;  Surgeon: Lollie Sails, MD;  Location: [facility] ENDOSCOPY;  Service: Endoscopy;  Laterality: N/A;  . COLONOSCOPY WITH PROPOFOL N/A 01/27/2016   Procedure: COLONOSCOPY WITH PROPOFOL;  Surgeon: Lollie Sails, MD;  Location: [facility] ENDOSCOPY;  Service: Endoscopy;  Laterality: N/A;  . CORONARY ARTERY BYPASS GRAFT  2013  . INCISION AND DRAINAGE     chest abscess  . INCISION AND DRAINAGE ABSCESS N/A 12/11/2018   Procedure: INCISION AND DRAINAGE PERINEAL;  Surgeon: Jules Husbands, MD;  Location: [facility] ORS;  Service: General;  Laterality: N/A;  . RETINAL LASER PROCEDURE       Prior to Admission medications   Not on File     Allergies Patient has no known allergies.   Family  History  Problem Relation Age of Onset  . Breast cancer Mother   . Prostate cancer Father        we think mets to liver and bone    Social History Social History   Tobacco Use  . Smoking status: Never Smoker  . Smokeless tobacco: Never Used  Substance Use Topics  . Alcohol use: No  . Drug use: No    Review of Systems  Constitutional:   No fever or chills.  ENT:   No sore throat. No rhinorrhea.  Right neck pain. Cardiovascular:   No chest pain or syncope. Respiratory:   No  dyspnea or cough. Gastrointestinal:   Negative for abdominal pain, vomiting and diarrhea.  Musculoskeletal:   Negative for focal pain or swelling All other systems reviewed and are negative except as documented above in ROS and HPI.  ____________________________________________   PHYSICAL EXAM:  VITAL SIGNS: ED Triage Vitals  Enc Vitals Group     BP 07/21/19 1112 (!) 158/59     Pulse Rate 07/21/19 1109 63     Resp 07/21/19 1109 18     Temp 07/21/19 1109 97.7 F (36.5 C)     Temp Source 07/21/19 1109 Oral     SpO2 07/21/19 1109 97 %     Weight 07/21/19 1112 250 lb (113.4 kg)     Height 07/21/19 1112 5\' 8"  (1.727 m)     Head Circumference --      Peak Flow --      Pain Score 07/21/19 1111 6     Pain Loc --      Pain Edu? --      Excl. in [place]? --     Vital signs reviewed, nursing assessments reviewed.   Constitutional:   Alert and oriented. Non-toxic appearance. Eyes:   Conjunctivae are normal. EOMI. PERRL. ENT      Head:   Normocephalic and atraumatic.      Nose:   No congestion/rhinnorhea.       Mouth/Throat:   MMM, no pharyngeal erythema. No peritonsillar mass.       Neck:   No meningismus. Full ROM.  Neck is nontender.  No midline tenderness. Hematological/Lymphatic/Immunilogical:   No cervical lymphadenopathy. Cardiovascular:   RRR. Symmetric bilateral radial and DP pulses.  No murmurs. Cap refill less than 2 seconds. Respiratory:   Normal respiratory effort without tachypnea/retractions. Breath sounds are clear and equal bilaterally. No wheezes/rales/rhonchi. Gastrointestinal:   Soft and nontender. Non distended. There is no CVA tenderness.  No rebound, rigidity, or guarding.  Musculoskeletal:   Normal range of motion in all extremities. No joint effusions.  No lower extremity tenderness.  No edema. Neurologic:   Normal speech and language.  Cranial nerves II through XII intact No pronator drift, normal cerebellar function Hints exam benign Symptoms aggravated by  rotating head to the left Motor grossly intact. NIH stroke scale 1 No acute focal neurologic deficits are appreciated.  Skin:    Skin is warm, dry and intact. No rash noted.  No petechiae, purpura, or bullae.  ____________________________________________    LABS (pertinent positives/negatives) (all labs ordered are listed, but only abnormal results are displayed) Labs Reviewed  BASIC METABOLIC PANEL - Abnormal; Notable for the following components:      Result Value   Glucose, Bld 346 (*)    BUN 58 (*)    Creatinine, Ser 2.07 (*)    GFR calc non Af Amer 26 (*)    GFR calc Af Wyvonnia Lora  30 (*)    All other components within normal limits  CBC - Abnormal; Notable for the following components:   WBC 10.9 (*)    RBC 3.75 (*)    Hemoglobin 10.8 (*)    HCT 32.4 (*)    All other components within normal limits  SARS CORONAVIRUS 2  URINALYSIS, COMPLETE (UACMP) WITH MICROSCOPIC   ____________________________________________   EKG  Interpreted by me Sinus rhythm rate of 66, normal axis intervals.  Poor R wave progression.  Normal ST segments and T waves.  ____________________________________________    RADIOLOGY  Ct Head Wo Contrast  Result Date: 07/21/2019 CLINICAL DATA:  Vertigo. EXAM: CT HEAD WITHOUT CONTRAST TECHNIQUE: Contiguous axial images were obtained from the base of the skull through the vertex without intravenous contrast. COMPARISON:  June 19, 2011 FINDINGS: Brain: There is low-density with loss of gray-white differentiation of the right parietal lobe consistent with acute to subacute infarct. There is no midline shift or hydrocephalus. No acute hemorrhage is noted. Small low densities identified in the left cerebral melanoma unchanged compared prior exam, chronic. Vascular: No hyperdense vessel is noted. Skull: Normal. Negative for fracture or focal lesion. Sinuses/Orbits: No acute findings Other: None IMPRESSION: Right parietal lobe acute to subacute infarct. No acute  hemorrhage is noted. These results will be called to the ordering clinician or representative by the Radiologist Assistant, and communication documented in the PACS or zVision Dashboard. Electronically Signed   By: Abelardo Diesel M.D.   On: 07/21/2019 12:01    ____________________________________________   PROCEDURES Procedures  ____________________________________________  DIFFERENTIAL DIAGNOSIS   Carotid/vertebral dissection, stroke, intracranial hemorrhage, benign peripheral vertigo  CLINICAL IMPRESSION / ASSESSMENT AND PLAN / ED COURSE  Medications ordered in the ED: Medications  aspirin chewable tablet 324 mg (has no administration in time range)  meclizine (ANTIVERT) tablet 50 mg (50 mg Oral Given 07/21/19 1201)  sodium chloride 0.9 % bolus 1,000 mL (1,000 mLs Intravenous New Bag/Given 07/21/19 1201)    Pertinent labs & imaging results that were available during my care of the patient were reviewed by me and considered in my medical decision making (see chart for details).  Sharon Arias was evaluated in Emergency Department on 07/21/2019 for the symptoms described in the history of present illness. She was evaluated in the context of the global COVID-19 pandemic, which necessitated consideration that the patient might be at risk for infection with the SARS-CoV-2 virus that causes COVID-19. Institutional protocols and algorithms that pertain to the evaluation of patients at risk for COVID-19 are in a state of rapid change based on information released by regulatory bodies including the CDC and federal and state organizations. These policies and algorithms were followed during the patient's care in the ED.   Patient presents with episodes of dizziness, most likely peripheral vertigo based on exam.  However with her persistence of symptoms since last night, diabetes with complications and other comorbidities, she will need CT angiogram to evaluate for vascular injury.  If negative I would  proceed with MRI to rule out acute CVA and if MRI is reassuring I think she could follow-up for continued outpatient monitoring.  In the meantime I will give meclizine and IV fluids for symptomatic support.  Not a candidate for TPA or revascularization. VAN negative   ----------------------------------------- 12:38 PM on 07/21/2019 -----------------------------------------  CT head shows acute infarct of right parietal lobe.  Plan to admit for further stroke management.  Aspirin and stroke swallow screen ordered.  Patient meets low  risk COVID screening criteria.     ____________________________________________   FINAL CLINICAL IMPRESSION(S) / ED DIAGNOSES    Final diagnoses:  Acute ischemic stroke (HCC)  Stage 4 chronic kidney disease ([place])  Type 2 diabetes mellitus with stage 4 chronic kidney disease, unspecified whether long term insulin use [facility])     ED Discharge Orders    None      Portions of this note were generated with dragon dictation software. Dictation errors may occur despite best attempts at proofreading.   [name], MD 07/21/19 [place]    [name], MD 07/21/19 1239

## 2019-07-21 NOTE — ED Notes (Signed)
Patient transported to CT 

## 2019-07-22 ENCOUNTER — Inpatient Hospital Stay (HOSPITAL_COMMUNITY)
Admit: 2019-07-22 | Discharge: 2019-07-22 | Disposition: A | Discharge status: Home / Self Care | Payer: Medicare Other | Attending: Internal Medicine | Admitting: Internal Medicine

## 2019-07-22 DIAGNOSIS — I340 Nonrheumatic mitral (valve) insufficiency: Secondary | ICD-10-CM

## 2019-07-22 DIAGNOSIS — I361 Nonrheumatic tricuspid (valve) insufficiency: Secondary | ICD-10-CM

## 2019-07-22 LAB — ECHOCARDIOGRAM COMPLETE
Height: 68 in
Weight: 4000 oz

## 2019-07-22 LAB — LIPID PANEL
Cholesterol: 252 mg/dL — ABNORMAL HIGH (ref 0–200)
HDL: 30 mg/dL — ABNORMAL LOW (ref >40)
LDL Cholesterol: UNDETERMINED mg/dL (ref 0–99)
Total CHOL/HDL Ratio: 8.4 RATIO
Triglycerides: 402 mg/dL — ABNORMAL HIGH (ref <150)
VLDL: UNDETERMINED mg/dL (ref 0–40)

## 2019-07-22 LAB — GLUCOSE, CAPILLARY
Glucose-Capillary: 199 mg/dL — ABNORMAL HIGH (ref 70–99)
Glucose-Capillary: 292 mg/dL — ABNORMAL HIGH (ref 70–99)

## 2019-07-22 LAB — LDL CHOLESTEROL, DIRECT: Direct LDL: 141.7 mg/dL — ABNORMAL HIGH (ref 0–99)

## 2019-07-22 LAB — HEMOGLOBIN A1C
Hgb A1c MFr Bld: 10.8 % — ABNORMAL HIGH (ref 4.8–5.6)
Mean Plasma Glucose: 263.26 mg/dL

## 2019-07-22 MED ORDER — FENOFIBRATE 160 MG PO TABS: 160.0000 mg | ORAL_TABLET | Freq: Every day | ORAL | 0 refills | Status: DC

## 2019-07-22 MED ORDER — CLOPIDOGREL BISULFATE 75 MG PO TABS: 75.0000 mg | ORAL_TABLET | Freq: Every day | ORAL | 0 refills | Status: DC

## 2019-07-22 MED ORDER — FENOFIBRATE 160 MG PO TABS
160.0000 mg | ORAL_TABLET | Freq: Every day | ORAL | Status: DC
  Administered 2019-07-22: 15:00:00 160 mg via ORAL
  Filled 2019-07-22: qty 1

## 2019-07-22 MED ORDER — SCOPOLAMINE 1 MG/3DAYS TD PT72
1.0000 | MEDICATED_PATCH | TRANSDERMAL | Status: DC
  Administered 2019-07-22: 1.5 mg via TRANSDERMAL
  Filled 2019-07-22: qty 1

## 2019-07-22 MED ORDER — MECLIZINE HCL 12.5 MG PO TABS: 12.5000 mg | ORAL_TABLET | Freq: Two times a day (BID) | ORAL | 0 refills | Status: DC | PRN

## 2019-07-22 NOTE — Progress Notes (Signed)
MD order received in [facility] to discharge pt home today; Care Management previously left a rolling walker in the pt's room for the pt to take home; verbally reviewed AVS with the pt, Rxs escribed to the pt's pharmacy; no questions voiced at this time; pt discharged via wheelchair by nursing to the medical mall entrance

## 2019-07-22 NOTE — TOC Transition Note (Signed)
Transition of Care [facility]) - CM/SW Discharge Note   Patient Details  Name: Sharon Arias MRN: [id number] Date of Birth: 1960-12-25  Transition [facility]) CM/SW Contact:  Latanya Maudlin, RN Phone Number: 07/22/2019, 7:45 PM   Clinical Narrative:  Patient to discharge with a rolling walker. Obtained from [employer] and delivered to the bedside. PT also recommending and patient agreeable. She prefers to use outpatient clinic in house or at [facility] since her P[facility]. I have provided her with info to make PT appointment.    Final next level of care: Home/Self Care Barriers to Discharge: No Barriers Identified   Patient Goals and CMS Choice   CMS Medicare.gov Compare Post Acute Care list provided to:: Patient Choice offered to / list presented to : Patient  Discharge Placement                       Discharge Plan and Services                DME Arranged: Walker rolling DME Agency: [employer] Date DME Agency Contacted: 07/22/19 Time DME Agency Contacted: 1944 Representative spoke with at DME Agency: Brad            Social Determinants of Health ([place]) Interventions     Readmission Risk Interventions No flowsheet data found.

## 2019-07-22 NOTE — Plan of Care (Signed)

## 2019-07-22 NOTE — Evaluation (Signed)
Physical Therapy Evaluation Patient Details Name: Sharon Arias MRN: [id number] DOB: Dec 12, 1961 Today's Date: 07/22/2019   History of Present Illness  Pt admitted for complaints of dizziness and fall. MRI negative for CVA. History includes prior CVA, obesity, and CKD.   Clinical Impression  Pt is a pleasant 58 year old female who was admitted for dizziness with fall and vomiting. Pt performs bed mobility/transfers with mod I and ambulation with cga and no AD. Pt demonstrates deficits with dizziness that is affecting mobility efforts. Pt rates dizziness at 7/10 with any movement, calms down slightly with resting. No deficits noted in strength/coordination/sensation. Would benefit from skilled PT to address above deficits and promote optimal return to PLOF. Recommending OP PT for vertigo.    Follow Up Recommendations Outpatient PT(vestibular PT)    Equipment Recommendations  None recommended by PT    Recommendations for Other Services       Precautions / Restrictions Precautions Precautions: Fall Restrictions Weight Bearing Restrictions: No      Mobility  Bed Mobility Overal bed mobility: Modified Independent             General bed mobility comments: uses railing to assist sitting at EOB. Reports increased dizziness to 7/10 with movement, however dissapates with resting  Transfers Overall transfer level: Modified independent Equipment used: None             General transfer comment: pushes from seated surface. Once standing good balance noted  Ambulation/Gait Ambulation/Gait assistance: Min guard Gait Distance (Feet): 5 Feet Assistive device: None Gait Pattern/deviations: Step-to pattern     General Gait Details: ambulated to recliner in room. Further distance limited secondary to dizziness. Keeps head down to assist with dizziness symptoms and head/body move as unit.  Stairs            Wheelchair Mobility    Modified Rankin (Stroke Patients Only)        Balance Overall balance assessment: History of Falls                                           Pertinent Vitals/Pain Pain Assessment: No/denies pain    Home Living Family/patient expects to be discharged to:: Private residence Living Arrangements: Spouse/significant other Available Help at Discharge: Family Type of Home: House Home Access: Stairs to enter Entrance Stairs-Rails: Can reach both Entrance Stairs-Number of Steps: 2 Home Layout: One level Home Equipment: Wheelchair - manual      Prior Function Level of Independence: Independent         Comments: active and indep prior to admission     Hand Dominance        Extremity/Trunk Assessment   Upper Extremity Assessment Upper Extremity Assessment: Overall WFL for tasks assessed    Lower Extremity Assessment Lower Extremity Assessment: Overall WFL for tasks assessed       Communication   Communication: No difficulties  Cognition Arousal/Alertness: Awake/alert Behavior During Therapy: WFL for tasks assessed/performed Overall Cognitive Status: Within Functional Limits for tasks assessed                                        General Comments      Exercises Other Exercises Other Exercises: while seated at EOB, talked about compesatory strategies to assist with symptoms and practiced  bed mobility/transfers to ease dizziness symptoms.   Assessment/Plan    PT Assessment Patient needs continued PT services  PT Problem List Decreased balance;Decreased mobility       PT Treatment Interventions Gait training;Balance training    PT Goals (Current goals can be found in the Care Plan section)  Acute Rehab PT Goals Patient Stated Goal: to go home PT Goal Formulation: With patient Time For Goal Achievement: 08/05/19 Potential to Achieve Goals: Good    Frequency Min 2X/week   Barriers to discharge        Co-evaluation               AM-PAC PT "6  Clicks" Mobility  Outcome Measure Help needed turning from your back to your side while in a flat bed without using bedrails?: None Help needed moving from lying on your back to sitting on the side of a flat bed without using bedrails?: None Help needed moving to and from a bed to a chair (including a wheelchair)?: A Little Help needed standing up from a chair using your arms (e.g., wheelchair or bedside chair)?: A Little Help needed to walk in hospital room?: A Little Help needed climbing 3-5 steps with a railing? : A Little 6 Click Score: 20    End of Session Equipment Utilized During Treatment: Gait belt Activity Tolerance: Patient tolerated treatment well Patient left: in chair;with chair alarm set Nurse Communication: Mobility status PT Visit Diagnosis: Muscle weakness (generalized) (M62.81);History of falling (Z91.81);Difficulty in walking, not elsewhere classified (R26.2);Dizziness and giddiness (R42)    Time: 8185-9093 PT Time Calculation (min) (ACUTE ONLY): 25 min   Charges:   PT Evaluation $PT Eval Low Complexity: 1 Low PT Treatments $Therapeutic Activity: 8-22 mins        Greggory Stallion, PT, DPT [phone]   Brayon Bielefeld 07/22/2019, 10:01 AM

## 2019-07-22 NOTE — Discharge Summary (Signed)
[facility] NAME: Sharon Arias    MR#:  [id number]  DATE OF BIRTH:  [birth year]  DATE OF ADMISSION:  07/21/2019 ADMITTING PHYSICIAN: Fritzi Mandes, MD  DATE OF DISCHARGE: 07/22/2019   PRIMARY CARE PHYSICIAN: Idelle Crouch, MD    ADMISSION DIAGNOSIS:  Acute ischemic stroke ([place]) [I63.9] Stage 4 chronic kidney disease (HCC) [N18.4] Type 2 diabetes mellitus with stage 4 chronic kidney disease, unspecified whether long term insulin use ([place]) [E11.22, N18.4]  DISCHARGE DIAGNOSIS:  Active Problems:   Acute CVA (cerebrovascular accident) ([place])   SECONDARY DIAGNOSIS:   Past Medical History:  Diagnosis Date  . Anemia in chronic kidney disease 05/19/2016  . CAD (coronary artery disease)   . Chicken pox   . Chronic kidney disease    per dr sparks  . CVA (cerebral vascular accident) ([place])   . Detached retina   . Diabetes mellitus without complication ([place])    type 2  . Diabetic neuropathy ([place])   . Diabetic retinopathy ([place])    legally blind  . Hyperlipidemia   . Hypertension   . Myocardial infarction ([place])   . Obesity   . PONV (postoperative nausea and vomiting)   . Sciatica of right side    going to see physiciatry    HOSPITAL COURSE:   Patient came with complaint of feeling dizzy on postural change and trying to get up.  CT scan and MRI of the brain was done which showed subacute stroke.  Neurologist consult was done for further stroke work-up including carotid Doppler studies and echocardiogram Which did not show any significant contributing factors. She was already taking aspirin so Plavix was added as per neurologist recommendation. She was already on atorvastatin 40 mg daily and had muscle weakness reactions to higher dose in the past.  Her LDL and triglyceride were high so started on TriCor. She had significant vertigo and dizziness, likely suspected BPPV by neurologist.  Seen by physical therapist.  Started on  [place]. Patient was feeling comfortable to walk with a walker and discharged home.  Advised to continue Antivert and follow with primary care physician in 1 to 2 weeks.  DISCHARGE CONDITIONS:   Stable.  CONSULTS OBTAINED:  Treatment Team:  Leotis Pain, MD  DRUG ALLERGIES:  No Known Allergies  DISCHARGE MEDICATIONS:   Allergies as of 07/22/2019   No Known Allergies     Medication List    TAKE these medications   acetaminophen 500 MG tablet Commonly known as: TYLENOL Take 1,000 mg by mouth every 8 (eight) hours as needed for pain.   allopurinol 300 MG tablet Commonly known as: ZYLOPRIM Take 300 mg by mouth daily.   allopurinol 100 MG tablet Commonly known as: ZYLOPRIM Take 200 mg by mouth at bedtime.   amLODipine 10 MG tablet Commonly known as: NORVASC Take 10 mg by mouth at bedtime.   aspirin EC 81 MG tablet Take 81 mg by mouth every morning.   atorvastatin 40 MG tablet Commonly known as: LIPITOR Take 40 mg by mouth at bedtime.   clopidogrel 75 MG tablet Commonly known as: PLAVIX Take 1 tablet (75 mg total) by mouth daily. Start taking on: July 23, 2019   fenofibrate 160 MG tablet Take 1 tablet (160 mg total) by mouth daily.   furosemide 40 MG tablet Commonly known as: LASIX Take 40 mg by mouth 2 (two) times a day.   insulin lispro 100 UNIT/ML KwikPen Commonly known as: HUMALOG  Inject 15-30 Units into the skin 3 (three) times daily.   Lantus SoloStar 100 UNIT/ML Solostar Pen Generic drug: Insulin Glargine Inject 80 Units into the skin at bedtime.   losartan 100 MG tablet Commonly known as: COZAAR Take 100 mg by mouth daily.   meclizine 12.5 MG tablet Commonly known as: ANTIVERT Take 1 tablet (12.5 mg total) by mouth 2 (two) times daily as needed for dizziness.   metoprolol tartrate 50 MG tablet Commonly known as: LOPRESSOR Take 50 mg by mouth 2 (two) times a day.            Durable Medical Equipment  (From admission, onward)          Start     Ordered   07/22/19 1510  For home use only DME Walker rolling  [facility])  Once    Question:  Patient needs a walker to treat with the following condition  Answer:  Stroke ([place])   07/22/19 1509           DISCHARGE INSTRUCTIONS:    Follow with PMD in 1-2 weeks.  If you experience worsening of your admission symptoms, develop shortness of breath, life threatening emergency, suicidal or homicidal thoughts you must seek medical attention immediately by calling 911 or calling your MD immediately  if symptoms less severe.  You Must read complete instructions/literature along with all the possible adverse reactions/side effects for all the Medicines you take and that have been prescribed to you. Take any new Medicines after you have completely understood and accept all the possible adverse reactions/side effects.   Please note  You were cared for by a hospitalist during your hospital stay. If you have any questions about your discharge medications or the care you received while you were in the hospital after you are discharged, you can call the unit and asked to speak with the hospitalist on call if the hospitalist that took care of you is not available. Once you are discharged, your primary care physician will handle any further medical issues. Please note that NO REFILLS for any discharge medications will be authorized once you are discharged, as it is imperative that you return to your primary care physician (or establish a relationship with a primary care physician if you do not have one) for your aftercare needs so that they can reassess your need for medications and monitor your lab values.    Today   CHIEF COMPLAINT:   Chief Complaint  Patient presents with  . Dizziness    HISTORY OF PRESENT ILLNESS:  Sharon Arias  is a 58 y.o. female with a known history of type II diabetes on insulin, obesity, diabetic neuropathy, diabetic retinopathy, chronic kidney disease  stage III due to diabetic nephropathy followed by Dr. Juleen China, CAD status post stent in the past comes to the emergency room after she had feeling of dizziness and and fall thereafter without any trauma. Patient went to bed there after woke up this morning cannot lift her head up feeling very dizzy and lightheaded. She denies any focal weakness any slurred speech or difficulty swallowing. She denies any new vision problems.  In the ER, she underwent workup for stroke and CT had showed right acute subacute parietal infarct. Patient is still for hemodynamically stable. She received 325 mg of aspirin. She is being admitted for further evaluation management.   VITAL SIGNS:  Blood pressure (!) 160/64, pulse 68, temperature 98.7 F (37.1 C), temperature source Oral, resp. rate 17, height 5\' 8"  (1.727  m), weight 113.4 kg, SpO2 98 %.  I/O:    Intake/Output Summary (Last 24 hours) at 07/22/2019 1509 Last data filed at 07/22/2019 1357 Gross per 24 hour  Intake 1450.49 ml  Output -  Net 1450.49 ml    PHYSICAL EXAMINATION:  GENERAL:  58 y.o.-year-old obese patient lying in the bed with no acute distress.  EYES: Pupils equal, round, reactive to light and accommodation. No scleral icterus. Extraocular muscles intact.  HEENT: Head atraumatic, normocephalic. Oropharynx and nasopharynx clear.  NECK:  Supple, no jugular venous distention. No thyroid enlargement, no tenderness.  LUNGS: Normal breath sounds bilaterally, no wheezing, rales,rhonchi or crepitation. No use of accessory muscles of respiration.  CARDIOVASCULAR: S1, S2 normal. No murmurs, rubs, or gallops.  ABDOMEN: Soft, non-tender, non-distended. Bowel sounds present. No organomegaly or mass.  EXTREMITIES: No pedal edema, cyanosis, or clubbing.  NEUROLOGIC: Cranial nerves II through XII are intact. Muscle strength 4/5 in all extremities. Sensation intact. Gait not checked.  PSYCHIATRIC: The patient is alert and oriented x 3.  SKIN: No obvious  rash, lesion, or ulcer.   DATA REVIEW:   CBC Recent Labs  Lab 07/21/19 1117  WBC 10.9*  HGB 10.8*  HCT 32.4*  PLT 287    Chemistries  Recent Labs  Lab 07/21/19 1117  NA 139  K 4.0  CL 104  CO2 22  GLUCOSE 346*  BUN 58*  CREATININE 2.07*  CALCIUM 9.4    Cardiac Enzymes No results for input(s): TROPONINI in the last 168 hours.  Microbiology Results  Results for orders placed or performed during the hospital encounter of 07/21/19  SARS CORONAVIRUS 2 Nasal Swab Aptima Multi Swab     Status: None   Collection Time: 07/21/19  1:01 PM   Specimen: Aptima Multi Swab; Nasal Swab  Result Value Ref Range Status   SARS Coronavirus 2 NEGATIVE NEGATIVE Final    Comment: (NOTE) SARS-CoV-2 target nucleic acids are NOT DETECTED. The SARS-CoV-2 RNA is generally detectable in upper and lower respiratory specimens during the acute phase of infection. Negative results do not preclude SARS-CoV-2 infection, do not rule out co-infections with other pathogens, and should not be used as the sole basis for treatment or other patient management decisions. Negative results must be combined with clinical observations, patient history, and epidemiological information. The expected result is Negative. Fact Sheet for Patients: SugarRoll.be Fact Sheet for Healthcare Providers: https://www.woods-mathews.com/ This test is not yet approved or cleared by the [place] FDA and  has been authorized for detection and/or diagnosis of SARS-CoV-2 by FDA under an Emergency Use Authorization (EUA). This EUA will remain  in effect (meaning this test can be used) for the duration of the COVID-19 declaration under Section 56 4(b)(1) of the Act, 21 U.S.C. section 360bbb-3(b)(1), unless the authorization is terminated or revoked sooner. Performed at [facility], [place] [address], [place], [place] [address]     RADIOLOGY:  Ct Head Wo Contrast  Result  Date: 07/21/2019 CLINICAL DATA:  Vertigo. EXAM: CT HEAD WITHOUT CONTRAST TECHNIQUE: Contiguous axial images were obtained from the base of the skull through the vertex without intravenous contrast. COMPARISON:  June 19, 2011 FINDINGS: Brain: There is low-density with loss of gray-white differentiation of the right parietal lobe consistent with acute to subacute infarct. There is no midline shift or hydrocephalus. No acute hemorrhage is noted. Small low densities identified in the left cerebral melanoma unchanged compared prior exam, chronic. Vascular: No hyperdense vessel is noted. Skull: Normal. Negative for  fracture or focal lesion. Sinuses/Orbits: No acute findings Other: None IMPRESSION: Right parietal lobe acute to subacute infarct. No acute hemorrhage is noted. These results will be called to the ordering clinician or representative by the Radiologist Assistant, and communication documented in the PACS or zVision Dashboard. Electronically Signed   By: Abelardo Diesel M.D.   On: 07/21/2019 12:01   Mr Brain Wo Contrast  Result Date: 07/21/2019 CLINICAL DATA:  Acute presentation with dizziness and falling. EXAM: MRI HEAD WITHOUT CONTRAST TECHNIQUE: Multiplanar, multiecho pulse sequences of the brain and surrounding structures were obtained without intravenous contrast. COMPARISON:  Head CT same day FINDINGS: Brain: Diffusion imaging does not show any acute or subacute infarction. Chronic small-vessel ischemic changes affect the pons. Few old scattered small vessel cerebellar infarctions. Old small vessel infarction of the left thalamus. Chronic small-vessel ischemic changes of the cerebral hemispheric white matter. Old right parietal cortical and subcortical infarction. No mass lesion, recent hemorrhage, hydrocephalus or extra-axial collection. Mild hemosiderin staining in the region of the old right parietal stroke. Vascular: Major vessels at the base of the brain show flow. Skull and upper cervical spine:  Negative Sinuses/Orbits: Clear/normal Other: None IMPRESSION: No acute finding. Old right parietal cortical and subcortical infarction. Chronic small-vessel ischemic changes affecting the pons, cerebellum, left thalamus and cerebral hemispheric white matter. No specific abnormality to explain acute dizziness. Electronically Signed   By: [place] Chimes M.D.   On: 07/21/2019 19:02   US Carotid Bilateral (at [facility] Only)  Result Date: 07/22/2019 CLINICAL DATA:  Acute stroke. Hypertension, hyperlipidemia, diabetes. EXAM: BILATERAL CAROTID DUPLEX ULTRASOUND TECHNIQUE: Pearline Cables scale imaging, color Doppler and duplex ultrasound were performed of bilateral carotid and vertebral arteries in the neck. COMPARISON:  None available FINDINGS: Criteria: Quantification of carotid stenosis is based on velocity parameters that correlate the residual internal carotid diameter with NASCET-based stenosis levels, using the diameter of the distal internal carotid lumen as the denominator for stenosis measurement. The following velocity measurements were obtained: RIGHT ICA: 101/12 cm/sec CCA: 78/5 cm/sec SYSTOLIC ICA/CCA RATIO:  1.0 ECA: 201 cm/sec LEFT ICA: 97/15 cm/sec CCA: 88/50 cm/sec SYSTOLIC ICA/CCA RATIO:  1.1 ECA: 204 cm/sec RIGHT CAROTID ARTERY: Mild plaque in the carotid bulb extending to ICA origin. No high-grade stenosis. Normal waveforms and color Doppler signal. RIGHT VERTEBRAL ARTERY:  Normal flow direction and waveform. LEFT CAROTID ARTERY: Calcified plaque at the carotid bifurcation. No high-grade stenosis. Normal waveforms and color Doppler signal. LEFT VERTEBRAL ARTERY: Normal flow direction and waveform. IMPRESSION: 1. Bilateral carotid bifurcation plaque resulting in less than 50% diameter ICA stenosis. 2. Antegrade bilateral vertebral arterial flow. Electronically Signed   By: Lucrezia Europe M.D.   On: 07/22/2019 10:25    EKG:   Orders placed or performed during the hospital encounter of 07/21/19  . ED EKG  .  ED EKG  . EKG 12-Lead  . EKG 12-Lead      Management plans discussed with the patient, family and they are in agreement.  CODE STATUS:     Code Status Orders  (From admission, onward)         Start     Ordered   07/21/19 1637  Full code  Continuous     07/21/19 1636        Code Status History    Date Active Date Inactive Code Status Order ID Comments User Context   12/11/2018 2004 12/12/2018 0801 Full Code [id number]  Jules Husbands, MD Inpatient   03/18/2018 2244 03/20/2018  1745 Full Code [id number]  Idelle Crouch, MD Inpatient   Advance Care Planning Activity      TOTAL TIME TAKING CARE OF THIS PATIENT: 35 minutes.    Vaughan Basta M.D on 07/22/2019 at 3:09 PM  Between 7am to 6pm - Pager - [phone]  After 6pm go to [email or web address] - password [email or web address] [facility]  Office  [phone]  CC: Primary care physician; Idelle Crouch, MD   Note: This dictation was prepared with Dragon dictation along with smaller phrase technology. Any transcriptional errors that result from this process are unintentional.

## 2019-08-30 DIAGNOSIS — Z8673 Personal history of transient ischemic attack (TIA), and cerebral infarction without residual deficits: Secondary | ICD-10-CM | POA: Insufficient documentation

## 2019-09-19 DIAGNOSIS — E1165 Type 2 diabetes mellitus with hyperglycemia: Secondary | ICD-10-CM | POA: Insufficient documentation

## 2019-11-14 ENCOUNTER — Other Ambulatory Visit: Payer: Self-pay | Admitting: Internal Medicine

## 2019-11-14 DIAGNOSIS — I10 Essential (primary) hypertension: Secondary | ICD-10-CM

## 2019-11-14 DIAGNOSIS — I739 Peripheral vascular disease, unspecified: Secondary | ICD-10-CM

## 2019-11-20 ENCOUNTER — Other Ambulatory Visit: Payer: Self-pay

## 2019-11-20 ENCOUNTER — Ambulatory Visit
Admission: RE | Admit: 2019-11-20 | Discharge: 2019-11-20 | Disposition: A | Discharge status: Home / Self Care | Payer: Medicare Other | Source: Ambulatory Visit | Attending: Internal Medicine | Admitting: Internal Medicine

## 2019-11-20 DIAGNOSIS — I10 Essential (primary) hypertension: Secondary | ICD-10-CM | POA: Insufficient documentation

## 2019-11-20 DIAGNOSIS — I739 Peripheral vascular disease, unspecified: Secondary | ICD-10-CM | POA: Diagnosis not present

## 2019-12-18 DIAGNOSIS — R809 Proteinuria, unspecified: Secondary | ICD-10-CM | POA: Insufficient documentation

## 2019-12-18 DIAGNOSIS — N2581 Secondary hyperparathyroidism of renal origin: Secondary | ICD-10-CM | POA: Insufficient documentation

## 2019-12-18 DIAGNOSIS — I129 Hypertensive chronic kidney disease with stage 1 through stage 4 chronic kidney disease, or unspecified chronic kidney disease: Secondary | ICD-10-CM | POA: Insufficient documentation

## 2020-02-13 DIAGNOSIS — I890 Lymphedema, not elsewhere classified: Secondary | ICD-10-CM | POA: Insufficient documentation

## 2020-03-18 ENCOUNTER — Emergency Department: Payer: Medicare Other

## 2020-03-18 ENCOUNTER — Other Ambulatory Visit: Payer: Self-pay

## 2020-03-18 ENCOUNTER — Inpatient Hospital Stay: Payer: Medicare Other

## 2020-03-18 ENCOUNTER — Inpatient Hospital Stay
Admission: EM | Admit: 2020-03-18 | Discharge: 2020-03-22 | DRG: 291 | Disposition: A | Discharge status: Home / Self Care | Payer: Medicare Other | Attending: Internal Medicine | Admitting: Internal Medicine

## 2020-03-18 ENCOUNTER — Encounter: Payer: Self-pay | Admitting: Emergency Medicine

## 2020-03-18 ENCOUNTER — Inpatient Hospital Stay (HOSPITAL_COMMUNITY)
Admit: 2020-03-18 | Discharge: 2020-03-18 | Disposition: A | Discharge status: Home / Self Care | Payer: Medicare Other | Attending: Internal Medicine | Admitting: Internal Medicine

## 2020-03-18 DIAGNOSIS — D72829 Elevated white blood cell count, unspecified: Secondary | ICD-10-CM | POA: Diagnosis not present

## 2020-03-18 DIAGNOSIS — I214 Non-ST elevation (NSTEMI) myocardial infarction: Secondary | ICD-10-CM

## 2020-03-18 DIAGNOSIS — E1122 Type 2 diabetes mellitus with diabetic chronic kidney disease: Secondary | ICD-10-CM | POA: Diagnosis present

## 2020-03-18 DIAGNOSIS — I639 Cerebral infarction, unspecified: Secondary | ICD-10-CM | POA: Diagnosis present

## 2020-03-18 DIAGNOSIS — Z951 Presence of aortocoronary bypass graft: Secondary | ICD-10-CM | POA: Diagnosis not present

## 2020-03-18 DIAGNOSIS — Z8673 Personal history of transient ischemic attack (TIA), and cerebral infarction without residual deficits: Secondary | ICD-10-CM

## 2020-03-18 DIAGNOSIS — Z20822 Contact with and (suspected) exposure to covid-19: Secondary | ICD-10-CM | POA: Diagnosis present

## 2020-03-18 DIAGNOSIS — Z7982 Long term (current) use of aspirin: Secondary | ICD-10-CM | POA: Diagnosis not present

## 2020-03-18 DIAGNOSIS — Z794 Long term (current) use of insulin: Secondary | ICD-10-CM

## 2020-03-18 DIAGNOSIS — I252 Old myocardial infarction: Secondary | ICD-10-CM

## 2020-03-18 DIAGNOSIS — I5031 Acute diastolic (congestive) heart failure: Secondary | ICD-10-CM

## 2020-03-18 DIAGNOSIS — E113519 Type 2 diabetes mellitus with proliferative diabetic retinopathy with macular edema, unspecified eye: Secondary | ICD-10-CM | POA: Diagnosis present

## 2020-03-18 DIAGNOSIS — R300 Dysuria: Secondary | ICD-10-CM

## 2020-03-18 DIAGNOSIS — Z8042 Family history of malignant neoplasm of prostate: Secondary | ICD-10-CM

## 2020-03-18 DIAGNOSIS — H548 Legal blindness, as defined in USA: Secondary | ICD-10-CM | POA: Diagnosis present

## 2020-03-18 DIAGNOSIS — I130 Hypertensive heart and chronic kidney disease with heart failure and stage 1 through stage 4 chronic kidney disease, or unspecified chronic kidney disease: Secondary | ICD-10-CM | POA: Diagnosis present

## 2020-03-18 DIAGNOSIS — I6350 Cerebral infarction due to unspecified occlusion or stenosis of unspecified cerebral artery: Secondary | ICD-10-CM

## 2020-03-18 DIAGNOSIS — I2510 Atherosclerotic heart disease of native coronary artery without angina pectoris: Secondary | ICD-10-CM | POA: Diagnosis present

## 2020-03-18 DIAGNOSIS — R29700 NIHSS score 0: Secondary | ICD-10-CM | POA: Diagnosis present

## 2020-03-18 DIAGNOSIS — N179 Acute kidney failure, unspecified: Secondary | ICD-10-CM | POA: Diagnosis present

## 2020-03-18 DIAGNOSIS — N184 Chronic kidney disease, stage 4 (severe): Secondary | ICD-10-CM | POA: Diagnosis present

## 2020-03-18 DIAGNOSIS — R200 Anesthesia of skin: Secondary | ICD-10-CM | POA: Diagnosis not present

## 2020-03-18 DIAGNOSIS — R0602 Shortness of breath: Secondary | ICD-10-CM

## 2020-03-18 DIAGNOSIS — N390 Urinary tract infection, site not specified: Secondary | ICD-10-CM | POA: Diagnosis present

## 2020-03-18 DIAGNOSIS — I509 Heart failure, unspecified: Secondary | ICD-10-CM

## 2020-03-18 DIAGNOSIS — I5033 Acute on chronic diastolic (congestive) heart failure: Secondary | ICD-10-CM | POA: Diagnosis present

## 2020-03-18 DIAGNOSIS — M109 Gout, unspecified: Secondary | ICD-10-CM | POA: Diagnosis present

## 2020-03-18 DIAGNOSIS — Z7902 Long term (current) use of antithrombotics/antiplatelets: Secondary | ICD-10-CM

## 2020-03-18 DIAGNOSIS — I248 Other forms of acute ischemic heart disease: Secondary | ICD-10-CM | POA: Diagnosis present

## 2020-03-18 DIAGNOSIS — E785 Hyperlipidemia, unspecified: Secondary | ICD-10-CM | POA: Diagnosis present

## 2020-03-18 DIAGNOSIS — B952 Enterococcus as the cause of diseases classified elsewhere: Secondary | ICD-10-CM | POA: Diagnosis present

## 2020-03-18 DIAGNOSIS — Z803 Family history of malignant neoplasm of breast: Secondary | ICD-10-CM | POA: Diagnosis not present

## 2020-03-18 DIAGNOSIS — E119 Type 2 diabetes mellitus without complications: Secondary | ICD-10-CM

## 2020-03-18 DIAGNOSIS — Z6838 Body mass index (BMI) 38.0-38.9, adult: Secondary | ICD-10-CM

## 2020-03-18 DIAGNOSIS — D631 Anemia in chronic kidney disease: Secondary | ICD-10-CM | POA: Diagnosis present

## 2020-03-18 DIAGNOSIS — I10 Essential (primary) hypertension: Secondary | ICD-10-CM | POA: Diagnosis present

## 2020-03-18 DIAGNOSIS — E876 Hypokalemia: Secondary | ICD-10-CM | POA: Diagnosis present

## 2020-03-18 LAB — BASIC METABOLIC PANEL
Anion gap: 13 (ref 5–15)
BUN: 36 mg/dL — ABNORMAL HIGH (ref 6–20)
CO2: 22 mmol/L (ref 22–32)
Calcium: 8.8 mg/dL — ABNORMAL LOW (ref 8.9–10.3)
Chloride: 105 mmol/L (ref 98–111)
Creatinine, Ser: 2.33 mg/dL — ABNORMAL HIGH (ref 0.44–1.00)
GFR calc Af Amer: 26 mL/min — ABNORMAL LOW (ref >60)
GFR calc non Af Amer: 22 mL/min — ABNORMAL LOW (ref >60)
Glucose, Bld: 229 mg/dL — ABNORMAL HIGH (ref 70–99)
Potassium: 3 mmol/L — ABNORMAL LOW (ref 3.5–5.1)
Sodium: 140 mmol/L (ref 135–145)

## 2020-03-18 LAB — HEMOGLOBIN A1C
Hgb A1c MFr Bld: 7.7 % — ABNORMAL HIGH (ref 4.8–5.6)
Mean Plasma Glucose: 174.29 mg/dL

## 2020-03-18 LAB — CBC WITH DIFFERENTIAL/PLATELET
Abs Immature Granulocytes: 0.06 10*3/uL (ref 0.00–0.07)
Basophils Absolute: 0.1 10*3/uL (ref 0.0–0.1)
Basophils Relative: 0 %
Eosinophils Absolute: 0.1 10*3/uL (ref 0.0–0.5)
Eosinophils Relative: 0 %
HCT: 28.3 % — ABNORMAL LOW (ref 36.0–46.0)
Hemoglobin: 9.7 g/dL — ABNORMAL LOW (ref 12.0–15.0)
Immature Granulocytes: 1 %
Lymphocytes Relative: 13 %
Lymphs Abs: 1.6 10*3/uL (ref 0.7–4.0)
MCH: 29.3 pg (ref 26.0–34.0)
MCHC: 34.3 g/dL (ref 30.0–36.0)
MCV: 85.5 fL (ref 80.0–100.0)
Monocytes Absolute: 1.2 10*3/uL — ABNORMAL HIGH (ref 0.1–1.0)
Monocytes Relative: 9 %
Neutro Abs: 10 10*3/uL — ABNORMAL HIGH (ref 1.7–7.7)
Neutrophils Relative %: 77 %
Platelets: 322 10*3/uL (ref 150–400)
RBC: 3.31 MIL/uL — ABNORMAL LOW (ref 3.87–5.11)
RDW: 15.1 % (ref 11.5–15.5)
WBC: 13 10*3/uL — ABNORMAL HIGH (ref 4.0–10.5)
nRBC: 0 % (ref 0.0–0.2)

## 2020-03-18 LAB — URINALYSIS, COMPLETE (UACMP) WITH MICROSCOPIC
Bilirubin Urine: NEGATIVE
Glucose, UA: >=500 mg/dL — AB
Ketones, ur: NEGATIVE mg/dL
Leukocytes,Ua: NEGATIVE
Nitrite: NEGATIVE
Protein, ur: >=300 mg/dL — AB
Specific Gravity, Urine: 1.015 (ref 1.005–1.030)
pH: 5 (ref 5.0–8.0)

## 2020-03-18 LAB — PROTIME-INR
INR: 1.2 (ref 0.8–1.2)
Prothrombin Time: 15.5 seconds — ABNORMAL HIGH (ref 11.4–15.2)

## 2020-03-18 LAB — APTT: aPTT: 44 seconds — ABNORMAL HIGH (ref 24–36)

## 2020-03-18 LAB — TYPE AND SCREEN
ABO/RH(D): A POS
Antibody Screen: NEGATIVE

## 2020-03-18 LAB — TROPONIN I (HIGH SENSITIVITY)
Troponin I (High Sensitivity): 2275 ng/L (ref <18)
Troponin I (High Sensitivity): 2884 ng/L (ref <18)
Troponin I (High Sensitivity): 2355 ng/L (ref <18)
Troponin I (High Sensitivity): 2094 ng/L (ref <18)

## 2020-03-18 LAB — RESPIRATORY PANEL BY RT PCR (FLU A&B, COVID)
Influenza A by PCR: NEGATIVE
Influenza B by PCR: NEGATIVE
SARS Coronavirus 2 by RT PCR: NEGATIVE

## 2020-03-18 LAB — BRAIN NATRIURETIC PEPTIDE: B Natriuretic Peptide: 851 pg/mL — ABNORMAL HIGH (ref 0.0–100.0)

## 2020-03-18 LAB — GLUCOSE, CAPILLARY
Glucose-Capillary: 225 mg/dL — ABNORMAL HIGH (ref 70–99)
Glucose-Capillary: 229 mg/dL — ABNORMAL HIGH (ref 70–99)
Glucose-Capillary: 185 mg/dL — ABNORMAL HIGH (ref 70–99)

## 2020-03-18 LAB — HEPARIN LEVEL (UNFRACTIONATED): Heparin Unfractionated: <0.1 IU/mL — ABNORMAL LOW (ref 0.30–0.70)

## 2020-03-18 MED ORDER — ACETAMINOPHEN 325 MG PO TABS: 650.0000 mg | ORAL_TABLET | Freq: Four times a day (QID) | ORAL | Status: DC | PRN

## 2020-03-18 MED ORDER — AMLODIPINE BESYLATE 5 MG PO TABS
10.0000 mg | ORAL_TABLET | Freq: Every day | ORAL | Status: DC
  Administered 2020-03-18 – 2020-03-21 (×4): 10 mg via ORAL
  Filled 2020-03-18 (×4): qty 2

## 2020-03-18 MED ORDER — CHOLECALCIFEROL 10 MCG (400 UNIT) PO TABS
400.0000 [IU] | ORAL_TABLET | Freq: Every day | ORAL | Status: DC
  Administered 2020-03-19 – 2020-03-22 (×4): 400 [IU] via ORAL
  Filled 2020-03-18 (×4): qty 1

## 2020-03-18 MED ORDER — DM-GUAIFENESIN ER 30-600 MG PO TB12
1.0000 | ORAL_TABLET | Freq: Two times a day (BID) | ORAL | Status: DC
  Administered 2020-03-18 – 2020-03-21 (×7): 1 via ORAL
  Filled 2020-03-18 (×9): qty 1

## 2020-03-18 MED ORDER — HEPARIN BOLUS VIA INFUSION
4000.0000 [IU] | Freq: Once | INTRAVENOUS | Status: AC
  Administered 2020-03-18: 4000 [IU] via INTRAVENOUS
  Filled 2020-03-18: qty 4000

## 2020-03-18 MED ORDER — INSULIN ASPART 100 UNIT/ML ~~LOC~~ SOLN
16.0000 [IU] | Freq: Every day | SUBCUTANEOUS | Status: DC
  Administered 2020-03-19 – 2020-03-21 (×3): 16 [IU] via SUBCUTANEOUS
  Filled 2020-03-18 (×3): qty 1

## 2020-03-18 MED ORDER — INSULIN ASPART 100 UNIT/ML ~~LOC~~ SOLN
20.0000 [IU] | Freq: Every day | SUBCUTANEOUS | Status: DC
  Administered 2020-03-18 – 2020-03-21 (×3): 20 [IU] via SUBCUTANEOUS
  Filled 2020-03-18 (×4): qty 1

## 2020-03-18 MED ORDER — ISOSORBIDE MONONITRATE ER 60 MG PO TB24
30.0000 mg | ORAL_TABLET | Freq: Every day | ORAL | Status: DC
  Administered 2020-03-19 – 2020-03-22 (×4): 30 mg via ORAL
  Filled 2020-03-18 (×4): qty 1

## 2020-03-18 MED ORDER — LOSARTAN POTASSIUM 50 MG PO TABS
100.0000 mg | ORAL_TABLET | Freq: Every day | ORAL | Status: DC
  Administered 2020-03-19 – 2020-03-20 (×2): 100 mg via ORAL
  Filled 2020-03-18 (×2): qty 2

## 2020-03-18 MED ORDER — ASPIRIN 81 MG PO CHEW
324.0000 mg | CHEWABLE_TABLET | Freq: Once | ORAL | Status: AC
  Administered 2020-03-18: 18:00:00 324 mg via ORAL

## 2020-03-18 MED ORDER — NITROGLYCERIN 0.4 MG SL SUBL: 0.4000 mg | SUBLINGUAL_TABLET | SUBLINGUAL | Status: DC | PRN

## 2020-03-18 MED ORDER — FUROSEMIDE 10 MG/ML IJ SOLN
40.0000 mg | Freq: Two times a day (BID) | INTRAMUSCULAR | Status: DC
  Administered 2020-03-18 – 2020-03-20 (×4): 40 mg via INTRAVENOUS
  Filled 2020-03-18 (×4): qty 4

## 2020-03-18 MED ORDER — INSULIN ASPART 100 UNIT/ML ~~LOC~~ SOLN
0.0000 [IU] | SUBCUTANEOUS | Status: DC
  Administered 2020-03-18: 16:00:00 3 [IU] via SUBCUTANEOUS
  Administered 2020-03-18 – 2020-03-19 (×2): 2 [IU] via SUBCUTANEOUS
  Administered 2020-03-19: 23:00:00 1 [IU] via SUBCUTANEOUS
  Administered 2020-03-19: 2 [IU] via SUBCUTANEOUS
  Administered 2020-03-19: 3 [IU] via SUBCUTANEOUS
  Administered 2020-03-19 (×2): 1 [IU] via SUBCUTANEOUS
  Administered 2020-03-20: 09:00:00 2 [IU] via SUBCUTANEOUS
  Administered 2020-03-20: 12:00:00 1 [IU] via SUBCUTANEOUS
  Administered 2020-03-20: 2 [IU] via SUBCUTANEOUS
  Administered 2020-03-20: 1 [IU] via SUBCUTANEOUS
  Administered 2020-03-21: 3 [IU] via SUBCUTANEOUS
  Administered 2020-03-21 (×2): 2 [IU] via SUBCUTANEOUS
  Administered 2020-03-21: 05:00:00 1 [IU] via SUBCUTANEOUS
  Administered 2020-03-21 – 2020-03-22 (×2): 2 [IU] via SUBCUTANEOUS
  Filled 2020-03-18 (×19): qty 1

## 2020-03-18 MED ORDER — MORPHINE SULFATE (PF) 2 MG/ML IV SOLN: 2.0000 mg | INTRAVENOUS | Status: DC | PRN

## 2020-03-18 MED ORDER — ONDANSETRON HCL 4 MG/2ML IJ SOLN: 4.0000 mg | Freq: Three times a day (TID) | INTRAMUSCULAR | Status: DC | PRN

## 2020-03-18 MED ORDER — CLOPIDOGREL BISULFATE 75 MG PO TABS
75.0000 mg | ORAL_TABLET | Freq: Every day | ORAL | Status: DC
  Administered 2020-03-19 – 2020-03-22 (×4): 75 mg via ORAL
  Filled 2020-03-18 (×4): qty 1

## 2020-03-18 MED ORDER — METOPROLOL TARTRATE 50 MG PO TABS
100.0000 mg | ORAL_TABLET | Freq: Two times a day (BID) | ORAL | Status: DC
  Administered 2020-03-18 – 2020-03-22 (×7): 100 mg via ORAL
  Filled 2020-03-18 (×7): qty 2

## 2020-03-18 MED ORDER — FENOFIBRATE 160 MG PO TABS
160.0000 mg | ORAL_TABLET | Freq: Every day | ORAL | Status: DC
  Administered 2020-03-19 – 2020-03-20 (×2): 160 mg via ORAL
  Filled 2020-03-18 (×2): qty 1

## 2020-03-18 MED ORDER — LORAZEPAM 2 MG/ML IJ SOLN
1.0000 mg | Freq: Once | INTRAMUSCULAR | Status: AC
  Administered 2020-03-18: 1 mg via INTRAVENOUS
  Filled 2020-03-18: qty 1

## 2020-03-18 MED ORDER — HYDRALAZINE HCL 20 MG/ML IJ SOLN
5.0000 mg | INTRAMUSCULAR | Status: DC | PRN
  Administered 2020-03-18: 19:00:00 5 mg via INTRAVENOUS
  Filled 2020-03-18: qty 1

## 2020-03-18 MED ORDER — ASPIRIN EC 81 MG PO TBEC
81.0000 mg | DELAYED_RELEASE_TABLET | ORAL | Status: DC
  Administered 2020-03-19 – 2020-03-22 (×4): 81 mg via ORAL
  Filled 2020-03-18 (×4): qty 1

## 2020-03-18 MED ORDER — HEPARIN (PORCINE) 25000 UT/250ML-% IV SOLN
2100.0000 [IU]/h | INTRAVENOUS | Status: DC
  Administered 2020-03-18: 1100 [IU]/h via INTRAVENOUS
  Administered 2020-03-19: 1400 [IU]/h via INTRAVENOUS
  Administered 2020-03-19: 1700 [IU]/h via INTRAVENOUS
  Administered 2020-03-20: 08:00:00 2100 [IU]/h via INTRAVENOUS
  Filled 2020-03-18 (×4): qty 250

## 2020-03-18 MED ORDER — INSULIN ASPART 100 UNIT/ML ~~LOC~~ SOLN
10.0000 [IU] | Freq: Every day | SUBCUTANEOUS | Status: DC
  Administered 2020-03-19 – 2020-03-22 (×4): 10 [IU] via SUBCUTANEOUS
  Filled 2020-03-18 (×4): qty 1

## 2020-03-18 MED ORDER — ALLOPURINOL 100 MG PO TABS
200.0000 mg | ORAL_TABLET | Freq: Every day | ORAL | Status: DC
  Administered 2020-03-18 – 2020-03-19 (×2): 200 mg via ORAL
  Filled 2020-03-18 (×3): qty 2

## 2020-03-18 MED ORDER — POTASSIUM CHLORIDE CRYS ER 20 MEQ PO TBCR
40.0000 meq | EXTENDED_RELEASE_TABLET | Freq: Once | ORAL | Status: AC
  Administered 2020-03-18: 15:00:00 40 meq via ORAL
  Filled 2020-03-18: qty 2

## 2020-03-18 MED ORDER — ALBUTEROL SULFATE (2.5 MG/3ML) 0.083% IN NEBU: 2.5000 mg | INHALATION_SOLUTION | RESPIRATORY_TRACT | Status: DC | PRN

## 2020-03-18 MED ORDER — ROSUVASTATIN CALCIUM 20 MG PO TABS
40.0000 mg | ORAL_TABLET | Freq: Every day | ORAL | Status: DC
  Administered 2020-03-18 – 2020-03-21 (×4): 40 mg via ORAL
  Filled 2020-03-18 (×2): qty 2
  Filled 2020-03-18: qty 8
  Filled 2020-03-18 (×2): qty 4
  Filled 2020-03-18 (×3): qty 2

## 2020-03-18 MED ORDER — ALLOPURINOL 300 MG PO TABS
300.0000 mg | ORAL_TABLET | Freq: Every day | ORAL | Status: DC
  Administered 2020-03-19 – 2020-03-20 (×2): 300 mg via ORAL
  Filled 2020-03-18 (×2): qty 1

## 2020-03-18 MED ORDER — INSULIN GLARGINE 100 UNIT/ML ~~LOC~~ SOLN
60.0000 [IU] | Freq: Every day | SUBCUTANEOUS | Status: DC
  Administered 2020-03-18 – 2020-03-19 (×2): 60 [IU] via SUBCUTANEOUS
  Filled 2020-03-18 (×5): qty 0.6

## 2020-03-18 MED ORDER — ASPIRIN 81 MG PO CHEW
CHEWABLE_TABLET | ORAL | Status: AC
  Filled 2020-03-18: qty 4

## 2020-03-18 MED ORDER — HEPARIN BOLUS VIA INFUSION
2700.0000 [IU] | Freq: Once | INTRAVENOUS | Status: AC
  Administered 2020-03-18: 2700 [IU] via INTRAVENOUS
  Filled 2020-03-18: qty 2700

## 2020-03-18 NOTE — ED Notes (Signed)
Pt taken to CT.

## 2020-03-18 NOTE — ED Provider Notes (Addendum)
[facility] Emergency Department Provider Note  ____________________________________________   First MD Initiated Contact with Patient 03/18/20 1231     (approximate)  I have reviewed the triage vital signs and the nursing notes.  History  Chief Complaint Shortness of Breath    HPI Sharon Arias is a 59 y.o. female with history of CAD, EF 50-55%, DM with peripheral neuropathy, CKD who presents to the emergency department with multiple complaints.  First, she complains of shortness of breath with any kind of exertion, even minimally moving around in the bed.  This started 3 days ago and has been constant since onset.  Shortness of breath is worsened with any kind of exertion.  Improved with rest.  She denies any associated chest pain.  Does have some associated lower extremity swelling.  She was recently represcribed Lasix that she started on Thursday.  Second, she complains of left-sided numbness.  This also started 3 days ago.  She complains of numbness to the left side of her body, including upper and lower extremities.  Denies any facial changes.  Does report baseline lower extremity diabetic neuropathy that is unchanged.  She denies any associated weakness or speech difficulties.  No headache.  Third, she is concerned for potential UTI.  She has had dysuria, urgency, malodorous urine for the last several days.   Past Medical Hx Past Medical History:  Diagnosis Date  . Anemia in chronic kidney disease 05/19/2016  . CAD (coronary artery disease)   . Chicken pox   . Chronic kidney disease    per dr sparks  . CVA (cerebral vascular accident) ([place])   . Detached retina   . Diabetes mellitus without complication ([place])    type 2  . Diabetic neuropathy ([place])   . Diabetic retinopathy ([place])    legally blind  . Hyperlipidemia   . Hypertension   . Myocardial infarction ([place])   . Obesity   . PONV (postoperative nausea and vomiting)   . Sciatica of right  side    going to see physiciatry    Problem List Patient Active Problem List   Diagnosis Date Noted  . Acute CVA (cerebrovascular accident) ([place]) 07/21/2019  . Necrotizing soft tissue infection 12/11/2018  . CAP (community acquired pneumonia) 03/18/2018  . Hypocalcemia 03/18/2018  . Hypokalemia 03/18/2018  . CKD (chronic kidney disease), stage IV ([place]) 09/29/2016  . Chronic arthritis 09/29/2016  . Anemia in chronic kidney disease 05/19/2016  . DDD (degenerative disc disease), lumbar 01/13/2016  . Well controlled type 2 diabetes mellitus ([place]) 10/01/2015  . Proliferative diabetic retinopathy with macular edema associated with type 2 diabetes mellitus ([place]) 11/13/2014  . Coronary artery disease 08/17/2012  . Obesity 08/16/2012  . Myocardial infarction ([place]) 08/16/2012  . Hyperlipidemia, unspecified 08/16/2012  . HTN (hypertension) 08/16/2012    Past Surgical Hx Past Surgical History:  Procedure Laterality Date  . COLONOSCOPY WITH PROPOFOL N/A 01/26/2016   Procedure: COLONOSCOPY WITH PROPOFOL;  Surgeon: Lollie Sails, MD;  Location: [facility] ENDOSCOPY;  Service: Endoscopy;  Laterality: N/A;  . COLONOSCOPY WITH PROPOFOL N/A 01/27/2016   Procedure: COLONOSCOPY WITH PROPOFOL;  Surgeon: Lollie Sails, MD;  Location: [facility] ENDOSCOPY;  Service: Endoscopy;  Laterality: N/A;  . CORONARY ARTERY BYPASS GRAFT  2013  . INCISION AND DRAINAGE     chest abscess  . INCISION AND DRAINAGE ABSCESS N/A 12/11/2018   Procedure: INCISION AND DRAINAGE PERINEAL;  Surgeon: Jules Husbands, MD;  Location: [facility] ORS;  Service: General;  Laterality: N/A;  . RETINAL LASER PROCEDURE      Medications Prior to Admission medications   Medication Sig Start Date End Date Taking? Authorizing Provider  acetaminophen (TYLENOL) 500 MG tablet Take 1,000 mg by mouth every 8 (eight) hours as needed for pain. 12/18/18   [provider]  allopurinol (ZYLOPRIM) 100 MG tablet Take 200 mg by mouth at bedtime. 05/15/19    [provider]  allopurinol (ZYLOPRIM) 300 MG tablet Take 300 mg by mouth daily. 05/15/19   [provider]  amLODipine (NORVASC) 10 MG tablet Take 10 mg by mouth at bedtime. 12/04/18   [provider]  aspirin EC 81 MG tablet Take 81 mg by mouth every morning.    [provider]  atorvastatin (LIPITOR) 40 MG tablet Take 40 mg by mouth at bedtime. 11/14/18   [provider]  clopidogrel (PLAVIX) 75 MG tablet Take 1 tablet (75 mg total) by mouth daily. 07/23/19   Vaughan Basta, MD  fenofibrate 160 MG tablet Take 1 tablet (160 mg total) by mouth daily. 07/22/19   Vaughan Basta, MD  furosemide (LASIX) 40 MG tablet Take 40 mg by mouth 2 (two) times a day. 05/07/19   [provider]  Insulin Glargine (LANTUS SOLOSTAR) 100 UNIT/ML Solostar Pen Inject 80 Units into the skin at bedtime. 10/07/18   [provider]  insulin lispro (HUMALOG) 100 UNIT/ML KwikPen Inject 15-30 Units into the skin 3 (three) times daily. 10/02/18   [provider]  losartan (COZAAR) 100 MG tablet Take 100 mg by mouth daily. 09/27/18   [provider]  meclizine (ANTIVERT) 12.5 MG tablet Take 1 tablet (12.5 mg total) by mouth 2 (two) times daily as needed for dizziness. 07/22/19   Vaughan Basta, MD  metoprolol tartrate (LOPRESSOR) 50 MG tablet Take 50 mg by mouth 2 (two) times a day. 11/14/18   [provider]    Allergies Patient has no known allergies.  Family Hx Family History  Problem Relation Age of Onset  . Breast cancer Mother   . Prostate cancer Father        we think mets to liver and bone    Social Hx Social History   Tobacco Use  . Smoking status: Never Smoker  . Smokeless tobacco: Never Used  Substance Use Topics  . Alcohol use: No  . Drug use: No     Review of Systems  Constitutional: Negative for fever. Negative for chills. Eyes: Negative for visual changes. ENT: Negative for sore  throat. Cardiovascular: Negative for chest pain. Respiratory: Positive for shortness of breath. Gastrointestinal: Negative for nausea. Negative for vomiting.  Genitourinary: Positive for dysuria. Musculoskeletal: Negative for leg swelling. Skin: Negative for rash. Neurological: Negative for headaches.  Positive for left-sided numbness.   Physical Exam  Vital Signs: ED Triage Vitals  Enc Vitals Group     BP 03/18/20 1203 (!) 173/51     Pulse Rate 03/18/20 1203 68     Resp 03/18/20 1203 (!) 22     Temp 03/18/20 1203 99.1 F (37.3 C)     Temp Source 03/18/20 1203 Oral     SpO2 03/18/20 1203 94 %     Weight 03/18/20 1204 255 lb (115.7 kg)     Height 03/18/20 1204 5\' 8"  (1.727 m)     Head Circumference --      Peak Flow --      Pain Score 03/18/20 1204 0     Pain Loc --  Pain Edu? --      Excl. in [place]? --     Constitutional: Alert and oriented. Well appearing. NAD.  Head: Normocephalic. Atraumatic. Eyes: Conjunctivae clear. Sclera anicteric. Pupils equal and symmetric. Nose: No masses or lesions. No congestion or rhinorrhea. Mouth/Throat: Wearing mask.  Neck: No stridor. Trachea midline.  Cardiovascular: Normal rate, regular rhythm. Extremities well perfused. Respiratory: Normal respiratory effort.  Decreased breath sound at bases.  94% on RA. Gastrointestinal: Soft. Non-distended. Non-tender.  Genitourinary: Deferred. Musculoskeletal: Bilateral lower extremity pitting edema to the midshin. No deformities. Neurologic:  Normal speech and language. No gross focal or lateralizing neurologic deficits are appreciated.  Face symmetric, no droop.  No dysarthria or dysphagia.  No focal weakness.  Reports decreased sensation to the LUE versus RUE.  Baseline symmetric decreased sensation to the BUE in the setting of known diabetic neuropathy Skin: Skin is warm, dry and intact. No rash noted. Psychiatric: Mood and affect are appropriate for situation.  EKG  Personally reviewed and  interpreted by myself.  EKG 12:04 PM Rate: 68 Rhythm: sinus Axis: normal Intervals: WNL LVH TWI I, aVL, lateral precordial No STEMI  Repeat 13:41  Rate: 66 Rhythm: sinus Axis: normal Intervals: WNL Mild ST depression I, aVL No STEMI    Radiology  CXR  IMPRESSION:  Enlargement of cardiac silhouette post CABG with pulmonary vascular congestion and pulmonary edema consistent with CHF.  Small LEFT pleural effusion.   CT head:  IMPRESSION:  No acute abnormality. Old infarcts.    Procedures  Procedure(s) performed (including critical care):  .Critical Care Performed by: Lilia Pro., MD Authorized by: Lilia Pro., MD   Critical care provider statement:    Critical care time (minutes):  35   Critical care was necessary to treat or prevent imminent or life-threatening deterioration of the following conditions: NSTEMI.   Critical care was time spent personally by me on the following activities:  Discussions with consultants, evaluation of patient's response to treatment, examination of patient, ordering and performing treatments and interventions, ordering and review of laboratory studies, ordering and review of radiographic studies, pulse oximetry, re-evaluation of patient's condition, obtaining history from patient or surrogate and review of old charts     Initial Impression / Assessment and Plan / MDM / ED Course  59 y.o. female who presents to the ED with multiple medical complaints, including #1 shortness of breath, #2 left-sided numbness, #3 concern for UTI  Ddx #1 - SOB + left sided numbness: HF exacerbation, pulmonary edema, PNA, pulmonary infection, ACS  Ddx #2 - left sided numbness: CVA, infection w/ recrudescence of prior CVA symptoms, atypical ACS  Ddx #3 - dysuria: UTI  Will plan for labs, UA, imaging  Clinical Course as of Mar 18 1750  Tue Mar 18, 2020  1355 Labs revealed troponin 2275.  BNP 851.  XR with pulmonary edema consistent with HF.   Concern patient had a cardiac event 3 days ago at the onset of her shortness of breath and left sided numbness. Likely atypical ACS symptoms given hx of DM. CT head negative for acute findings, old infarcts noted. We will plan for heparin drip for NSTEMI - she denies any blood in the urine, bloody or black stool, no recent falls or head injury, no recent surgery, and is in agreement with heparin.  Discussed with cardiology (Dr. Clayborn Bigness) who is in agreement with plan as well. Will discuss with hospitalist for admission.   [SM]    Clinical Course User Index [  SM] Lilia Pro., MD     _______________________________   As part of my medical decision making I have reviewed available labs, radiology tests, reviewed old records, obtained additional history from family, and discussed with consultants (cardiology, Dr. Clayborn Bigness).   Final Clinical Impression(s) / ED Diagnosis  Final diagnoses:  SOB (shortness of breath)  Numbness  Dysuria  NSTEMI (non-ST elevated myocardial infarction) [facility])       Note:  This document was prepared using Dragon voice recognition software and may include unintentional dictation errors.     Lilia Pro., MD 03/18/20 [phone]

## 2020-03-18 NOTE — Consult Note (Signed)
CARDIOLOGY CONSULT NOTE               Patient ID: Sharon Arias MRN: [id number] DOB/AGE: Nov 23, 1961 59 y.o.  Admit date: 03/18/2020 Referring Physician Dr. Blaine Hamper, Rhunette Croft  hospitalist Primary Physician Dr. Georgie Chard primary Primary Cardiologist Dr. Saralyn Pilar Reason for Consultation shortness of breath heart failure elevated troponin suggestive of non-STEMI  HPI: 59 year old white female history of known coronary disease including coronary bypass surgery 2013 patient had PCI and stent in 2018.  She has had a history of renal insufficiency hypertension diabetes hyperlipidemia obesity recently sought her cardiologist AP in the office last week and was doing reasonably well patient complains of an acute episode of dyspnea shortness of breath which continued for several days started on Friday complains of leg swelling as well no cough no significant chest pain no blackout spells or syncope because of persistent symptoms the patient finally came into the emergency room for evaluation was found to have elevated troponin and elevated BNP.  Patient is legally blind has history of myocardial infarction in 2013 at that time she had coronary bypass surgery she has been doing reasonably well until Friday when she started having significant dyspnea.  Review of systems complete and found to be negative unless listed above     Past Medical History:  Diagnosis Date  . Anemia in chronic kidney disease 05/19/2016  . CAD (coronary artery disease)   . Chicken pox   . Chronic kidney disease    per dr sparks  . CVA (cerebral vascular accident) ([place])   . Detached retina   . Diabetes mellitus without complication ([place])    type 2  . Diabetic neuropathy ([place])   . Diabetic retinopathy ([place])    legally blind  . Hyperlipidemia   . Hypertension   . Myocardial infarction ([place])   . Obesity   . PONV (postoperative nausea and vomiting)   . Sciatica of right side    going to see physiciatry    Past Surgical  History:  Procedure Laterality Date  . COLONOSCOPY WITH PROPOFOL N/A 01/26/2016   Procedure: COLONOSCOPY WITH PROPOFOL;  Surgeon: Lollie Sails, MD;  Location[facility] ENDOSCOPY;  Service: Endoscopy;  Laterality: N/A;  . COLONOSCOPY WITH PROPOFOL N/A 01/27/2016   Procedure: COLONOSCOPY WITH PROPOFOL;  Surgeon: Lollie Sails, MD;  Location: [facility] ENDOSCOPY;  Service: Endoscopy;  Laterality: N/A;  . CORONARY ARTERY BYPASS GRAFT  2013  . INCISION AND DRAINAGE     chest abscess  . INCISION AND DRAINAGE ABSCESS N/A 12/11/2018   Procedure: INCISION AND DRAINAGE PERINEAL;  Surgeon: Jules Husbands, MD;  Location: [facility] ORS;  Service: General;  Laterality: N/A;  . RETINAL LASER PROCEDURE      (Not in a hospital admission)  Social History   Socioeconomic History  . Marital status: Married    Spouse name: Not on file  . Number of children: Not on file  . Years of education: Not on file  . Highest education level: Not on file  Occupational History  . Not on file  Tobacco Use  . Smoking status: Never Smoker  . Smokeless tobacco: Never Used  Substance and Sexual Activity  . Alcohol use: No  . Drug use: No  . Sexual activity: Not on file  Other Topics Concern  . Not on file  Social History Narrative  . Not on file   Social Determinants of Health   Financial Resource Strain:   . Difficulty of Paying Living Expenses:  Food Insecurity:   . Worried About Charity fundraiser in the Last Year:   . Arboriculturist in the Last Year:   Transportation Needs:   . Film/video editor (Medical):   [name] Lack of Transportation (Non-Medical):   Physical Activity:   . Days of Exercise per Week:   . Minutes of Exercise per Session:   Stress:   . Feeling of Stress :   Social Connections:   . Frequency of Communication with Friends and Family:   . Frequency of Social Gatherings with Friends and Family:   . Attends Religious Services:   . Active Member of Clubs or Organizations:   . Attends English as a second language teacher Meetings:   [name] Marital Status:   Intimate Partner Violence:   . Fear of Current or Ex-Partner:   . Emotionally Abused:   [name] Physically Abused:   . Sexually Abused:     Family History  Problem Relation Age of Onset  . Breast cancer Mother   . Prostate cancer Father        we think mets to liver and bone      Review of systems complete and found to be negative unless listed above      PHYSICAL EXAM  General: Well developed, well nourished, in no acute distress HEENT:  Normocephalic and atramatic Neck:  No JVD.  Lungs: Clear bilaterally to auscultation and percussion. Heart: HRRR . Normal S1 and S2 without gallops or murmurs.  Abdomen: Bowel sounds are positive, abdomen soft and non-tender  Msk:  Back normal, normal gait. Normal strength and tone for age. Extremities: No clubbing, cyanosis or 2+edema.   Neuro: Alert and oriented X 3. Psych:  Good affect, responds appropriately  Labs:   Lab Results  Component Value Date   WBC 13.0 (H) 03/18/2020   HGB 9.7 (L) 03/18/2020   HCT 28.3 (L) 03/18/2020   MCV 85.5 03/18/2020   PLT 322 03/18/2020    Recent Labs  Lab 03/18/20 1212  NA 140  K 3.0*  CL 105  CO2 22  BUN 36*  CREATININE 2.33*  CALCIUM 8.8*  GLUCOSE 229*   No results found for: CKTOTAL, CKMB, CKMBINDEX, TROPONINI  Lab Results  Component Value Date   CHOL 252 (H) 07/22/2019   Lab Results  Component Value Date   HDL 30 (L) 07/22/2019   Lab Results  Component Value Date   LDLCALC UNABLE TO CALCULATE IF TRIGLYCERIDE OVER 400 mg/dL 07/22/2019   Lab Results  Component Value Date   TRIG 402 (H) 07/22/2019   Lab Results  Component Value Date   CHOLHDL 8.4 07/22/2019   Lab Results  Component Value Date   LDLDIRECT 141.7 (H) 07/22/2019      Radiology: DG Chest 2 View  Result Date: 03/18/2020 CLINICAL DATA:  Shortness of breath, LEFT side numbness since Friday, has UTI, history coronary artery disease post MI and bypass,  hypertension, diabetes mellitus EXAM: CHEST - 2 VIEW COMPARISON:  03/18/2018 FINDINGS: Enlargement of cardiac silhouette post CABG. Pulmonary vascular congestion. Atherosclerotic calcification aorta. Interstitial infiltrates in both lungs new since previous exam favoring pulmonary edema and CHF. Small LEFT pleural effusion. No pneumothorax or acute osseous findings. IMPRESSION: Enlargement of cardiac silhouette post CABG with pulmonary vascular congestion and pulmonary edema consistent with CHF. Small LEFT pleural effusion. Electronically Signed   By: Lavonia Dana M.D.   On: 03/18/2020 12:52   CT Head Wo Contrast  Result Date: 03/18/2020 CLINICAL DATA:  Left-sided  numbness. EXAM: CT HEAD WITHOUT CONTRAST TECHNIQUE: Contiguous axial images were obtained from the base of the skull through the vertex without intravenous contrast. COMPARISON:  07/21/2019 FINDINGS: Brain: No evidence of acute infarction, hemorrhage, hydrocephalus, extra-axial collection or mass lesion/mass effect. Old right posterior parietal infarct with secondary encephalomalacia. Tiny old infarcts in the pons and left thalamus. Vascular: No hyperdense vessel or unexpected calcification. Skull: Normal. Negative for fracture or focal lesion. Sinuses/Orbits: Normal. Other: None IMPRESSION: No acute abnormality. Old infarcts. Electronically Signed   By: Lorriane Shire M.D.   On: 03/18/2020 13:38    EKG: Normal sinus rhythm nonspecific ST-T wave changes  ASSESSMENT AND PLAN:  Non-STEMI Shortness of breath Congestive heart failure acute Coronary bypass surgery Coronary artery disease Hypertension Diabetes Hyperlipidemia Obesity Chronic renal insuff IV With supplements . Plan Agree with admit for rule out microinfarction  Follow-up EKGs and troponins Recommend echocardiogram for assessment of left ventricular function Continue diuretic therapy for heart failure Supplemental oxygen and inhalers as necessary Continue diabetes  management and control Continue fenofibrate and Crestor for lipid management Recommend nephrology input for renal insufficiency Recommend weight loss exercise portion control Consider sleep study for possible obstructive sleep apnea Recommend correcting hypokalemia with supplement Do not recommend invasive strategy like cardiac cath because of severe renal insufficiency Discussed the case with Dr. Saralyn Pilar     Signed: Yolonda Kida MD  03/18/2020, 4:06 PM

## 2020-03-18 NOTE — ED Notes (Signed)
ED Provider at bedside. 

## 2020-03-18 NOTE — ED Triage Notes (Signed)
Pt in via POV, reports ongoing shortness of breath since Friday, denies any chronic lung conditions.  Appears exerted with ambulation to triage.  Vitals WDL.

## 2020-03-18 NOTE — Progress Notes (Signed)
ANTICOAGULATION CONSULT NOTE  Pharmacy Consult for heparin Indication: NSTEMI  No Known Allergies  Patient Measurements: Height: 5\' 8"  (172.7 cm) Weight: 255 lb (115.7 kg) IBW/kg (Calculated) : 63.9 Heparin Dosing Weight: 90 kg  Vital Signs: Temp: 99.1 F (37.3 C) (03/30 1203) Temp Source: Oral (03/30 1203) BP: 169/68 (03/30 1500) Pulse Rate: 65 (03/30 1343)  Labs: Recent Labs    03/18/20 1212 03/18/20 1349 03/18/20 1447  HGB 9.7*  --   --   HCT 28.3*  --   --   PLT 322  --   --   APTT  --  44*  --   LABPROT  --  15.5*  --   INR  --  1.2  --   CREATININE 2.33*  --   --   TROPONINIHS 2,275*  --  2,884*    Estimated Creatinine Clearance: 35.1 mL/min (A) (by C-G formula based on SCr of 2.33 mg/dL (H)).   Medical History: Past Medical History:  Diagnosis Date  . Anemia in chronic kidney disease 05/19/2016  . CAD (coronary artery disease)   . Chicken pox   . Chronic kidney disease    per dr sparks  . CVA (cerebral vascular accident) ([place])   . Detached retina   . Diabetes mellitus without complication ([place])    type 2  . Diabetic neuropathy ([place])   . Diabetic retinopathy ([place])    legally blind  . Hyperlipidemia   . Hypertension   . Myocardial infarction ([place])   . Obesity   . PONV (postoperative nausea and vomiting)   . Sciatica of right side    going to see physiciatry    Assessment: 59 year old female presented with SOB and left-sided numbness. Initial troponin 2275 with trend up to 2884. Patient is not on anticoagulation PTA. Pharmacy consulted for heparin drip for NSTEMI.  Goal of Therapy:  Heparin level 0.3-0.7 units/ml Monitor platelets by anticoagulation protocol: Yes   Plan:  Heparin 4000 unit bolus followed by heparin drip at 1100 units/hr. Check HL at 2100. CBC daily.  Tawnya Crook, PharmD 03/18/2020,3:33 PM

## 2020-03-18 NOTE — ED Notes (Signed)
Pt taken to xray 

## 2020-03-18 NOTE — ED Notes (Signed)
First RN: pt reports sob, NAD noted with walking to desk. Pt also states that she feels like she has a UTI and that she has been experiencing left sided numbness since the weekend, reports hx of neuropathy.

## 2020-03-18 NOTE — ED Notes (Signed)
Further into triage, this RN inquired about any fluid medication, states she quit taking it 2 days ago due to medication making her "pee every 5 minutes."

## 2020-03-18 NOTE — Progress Notes (Signed)
ANTICOAGULATION CONSULT NOTE  Pharmacy Consult for heparin Indication: NSTEMI  No Known Allergies  Patient Measurements: Height: 5\' 8"  (172.7 cm) Weight: 255 lb 12.8 oz (116 kg) IBW/kg (Calculated) : 63.9 Heparin Dosing Weight: 90 kg  Vital Signs: Temp: 97.8 F (36.6 C) (03/30 2006) Temp Source: Oral (03/30 2006) BP: 153/61 (03/30 2006) Pulse Rate: 70 (03/30 2006)  Labs: Recent Labs    03/18/20 1212 03/18/20 1212 03/18/20 1349 03/18/20 1447 03/18/20 1850 03/18/20 2032  HGB 9.7*  --   --   --   --   --   HCT 28.3*  --   --   --   --   --   PLT 322  --   --   --   --   --   APTT  --   --  44*  --   --   --   LABPROT  --   --  15.5*  --   --   --   INR  --   --  1.2  --   --   --   HEPARINUNFRC  --   --   --   --   --  <0.10*  CREATININE 2.33*  --   --   --   --   --   TROPONINIHS 2,275*   < >  --  2,884* 2,355* 2,094*   < > = values in this interval not displayed.    Estimated Creatinine Clearance: 35.2 mL/min (A) (by C-G formula based on SCr of 2.33 mg/dL (H)).   Medical History: Past Medical History:  Diagnosis Date  . Anemia in chronic kidney disease 05/19/2016  . CAD (coronary artery disease)   . Chicken pox   . Chronic kidney disease    per dr sparks  . CVA (cerebral vascular accident) ([place])   . Detached retina   . Diabetes mellitus without complication ([place])    type 2  . Diabetic neuropathy ([place])   . Diabetic retinopathy ([place])    legally blind  . Hyperlipidemia   . Hypertension   . Myocardial infarction ([place])   . Obesity   . PONV (postoperative nausea and vomiting)   . Sciatica of right side    going to see physiciatry    Assessment: 59 year old female presented with SOB and left-sided numbness. Initial troponin 2275 with trend up to 2884. Patient is not on anticoagulation PTA. Pharmacy consulted for heparin drip for NSTEMI.  Goal of Therapy:  Heparin level 0.3-0.7 units/ml Monitor platelets by anticoagulation protocol: Yes   Plan:   Heparin 4000 unit bolus followed by heparin drip at 1100 units/hr. Check HL at 2100. CBC daily.  3/30: HL @ 2032 = < 0.1 Will order Heparin 2700 units IV X 1 and increase drip rate to 1400 units/hr.  Will recheck HL 6 hrs after rate change.   Darren Caldron D, PharmD 03/18/2020,9:21 PM

## 2020-03-18 NOTE — ED Notes (Signed)
Date and time results received: 03/18/20 1:36 PM   Test: Troponin Critical Value: 2275  Name of Provider Notified: Dr Joan Mayans

## 2020-03-18 NOTE — H&P (Signed)
History and Physical    Sharon Arias SVX:[id number] DOB: 1961/05/15 DOA: 03/18/2020  Referring MD/NP/PA:   PCP: Idelle Crouch, MD   Patient coming from:  The patient is coming from home.  At baseline, pt is independent for most of ADL.        Chief Complaint: SOB and left-sided numbness  HPI: Sharon Arias is a 59 y.o. female with medical history significant of hypertension, hyperlipidemia, diabetes mellitus, stroke, ,ced,h, CKD-4, anemia, who presents with shortness breath.  Patient states that she has been having shortness of breath for more than 4 days, which has been progressively worsening.  Denies chest pain.  She states that she has a chills, but no fever.  Patient states that she has whole left-sided numbness, and tingling in left hand.  No significant unilateral weakness in extremities.  No facial droop or slurred speech.  Patient has increased urinary frequency and burning on urination.  No nausea, vomiting, diarrhea, abdominal pain,  ED Course: pt was found to have troponin 2275, BNP 851, urinalysis not impressive.  Pending COVID-19 PCR, WBC 13.0, potassium 3.0, slightly worsening renal function, temperature 99, blood pressure 166/64, heart rate 65, oxygen saturation 92 to 94% on room air.  Chest x-ray showed cardiomegaly and pulmonary edema.  CT head is negative for acute intracranial abnormalities.  Review of Systems:   General: no fevers, has chills, no body weight gain, has fatigue HEENT: no blurry vision, hearing changes or sore throat Respiratory: has dyspnea, no coughing, wheezing CV: no chest pain, no palpitations GI: no nausea, vomiting, abdominal pain, diarrhea, constipation GU: no dysuria, burning on urination, increased urinary frequency, hematuria  Ext: has leg edema Neuro: no unilateral weakness or hearing loss. Has left sided numbness Skin: no rash, no skin tear. MSK: No muscle spasm, no deformity, no limitation of range of movement in spin Heme: No easy  bruising.  Travel history: No recent long distant travel.  Allergy: No Known Allergies  Past Medical History:  Diagnosis Date  . Anemia in chronic kidney disease 05/19/2016  . CAD (coronary artery disease)   . Chicken pox   . Chronic kidney disease    per dr sparks  . CVA (cerebral vascular accident) ([place])   . Detached retina   . Diabetes mellitus without complication ([place])    type 2  . Diabetic neuropathy ([place])   . Diabetic retinopathy ([place])    legally blind  . Hyperlipidemia   . Hypertension   . Myocardial infarction ([place])   . Obesity   . PONV (postoperative nausea and vomiting)   . Sciatica of right side    going to see physiciatry    Past Surgical History:  Procedure Laterality Date  . COLONOSCOPY WITH PROPOFOL N/A 01/26/2016   Procedure: COLONOSCOPY WITH PROPOFOL;  Surgeon: Lollie Sails, MD;  Location: [facility] ENDOSCOPY;  Service: Endoscopy;  Laterality: N/A;  . COLONOSCOPY WITH PROPOFOL N/A 01/27/2016   Procedure: COLONOSCOPY WITH PROPOFOL;  Surgeon: Lollie Sails, MD;  Location: [facility] ENDOSCOPY;  Service: Endoscopy;  Laterality: N/A;  . CORONARY ARTERY BYPASS GRAFT  2013  . INCISION AND DRAINAGE     chest abscess  . INCISION AND DRAINAGE ABSCESS N/A 12/11/2018   Procedure: INCISION AND DRAINAGE PERINEAL;  Surgeon: Jules Husbands, MD;  Location: [facility] ORS;  Service: General;  Laterality: N/A;  . RETINAL LASER PROCEDURE      Social History:  reports that she has never smoked. She has never used smokeless tobacco. She  reports that she does not drink alcohol or use drugs.  Family History:  Family History  Problem Relation Age of Onset  . Breast cancer Mother   . Prostate cancer Father        we think mets to liver and bone     Prior to Admission medications   Medication Sig Start Date End Date Taking? Authorizing Provider  acetaminophen (TYLENOL) 500 MG tablet Take 1,000 mg by mouth every 8 (eight) hours as needed for pain. 12/18/18   [provider]   allopurinol (ZYLOPRIM) 100 MG tablet Take 200 mg by mouth at bedtime. 05/15/19   [provider]  allopurinol (ZYLOPRIM) 300 MG tablet Take 300 mg by mouth daily. 05/15/19   [provider]  amLODipine (NORVASC) 10 MG tablet Take 10 mg by mouth at bedtime. 12/04/18   [provider]  aspirin EC 81 MG tablet Take 81 mg by mouth every morning.    [provider]  atorvastatin (LIPITOR) 40 MG tablet Take 40 mg by mouth at bedtime. 11/14/18   [provider]  clopidogrel (PLAVIX) 75 MG tablet Take 1 tablet (75 mg total) by mouth daily. 07/23/19   Vaughan Basta, MD  fenofibrate 160 MG tablet Take 1 tablet (160 mg total) by mouth daily. 07/22/19   Vaughan Basta, MD  furosemide (LASIX) 40 MG tablet Take 40 mg by mouth 2 (two) times a day. 05/07/19   [provider]  Insulin Glargine (LANTUS SOLOSTAR) 100 UNIT/ML Solostar Pen Inject 80 Units into the skin at bedtime. 10/07/18   [provider]  insulin lispro (HUMALOG) 100 UNIT/ML KwikPen Inject 15-30 Units into the skin 3 (three) times daily. 10/02/18   [provider]  losartan (COZAAR) 100 MG tablet Take 100 mg by mouth daily. 09/27/18   [provider]  meclizine (ANTIVERT) 12.5 MG tablet Take 1 tablet (12.5 mg total) by mouth 2 (two) times daily as needed for dizziness. 07/22/19   Vaughan Basta, MD  metoprolol tartrate (LOPRESSOR) 50 MG tablet Take 50 mg by mouth 2 (two) times a day. 11/14/18   [provider]    Physical Exam: Vitals:   03/18/20 1400 03/18/20 1430 03/18/20 1500 03/18/20 1600  BP: (!) 163/64 (!) 189/60 (!) 169/68 (!) 159/65  Pulse:    61  Resp: 20 12 13 18   Temp:      TempSrc:      SpO2:   95% 94%  Weight:      Height:       General: Not in acute distress HEENT:       Eyes: PERRL, EOMI, no scleral icterus.       ENT: No discharge from the ears and nose, no pharynx injection, no tonsillar enlargement.        Neck:  No JVD, no bruit, no mass felt. Heme: No neck lymph node enlargement. Cardiac: S1/S2, RRR, No murmurs, No gallops or rubs. Respiratory: No rales, wheezing, rhonchi or rubs. GI: Soft, nondistended, nontender, no rebound pain, no organomegaly, BS present. GU: No hematuria Ext: 2+ pitting leg edema bilaterally. 2+DP/PT pulse bilaterally. Musculoskeletal: No joint deformities, No joint redness or warmth, no limitation of ROM in spin. Skin: No rashes.  Neuro: Alert, oriented X3, cranial nerves II-XII grossly intact, moves all extremities normally.  Psych: Patient is not psychotic, no suicidal or hemocidal ideation.  Labs on Admission: I have personally reviewed following labs and imaging studies  CBC: Recent Labs  Lab 03/18/20 1212  WBC 13.0*  NEUTROABS 10.0*  HGB 9.7*  HCT 28.3*  MCV 85.5  PLT 644   Basic Metabolic Panel: Recent Labs  Lab 03/18/20 1212  NA 140  K 3.0*  CL 105  CO2 22  GLUCOSE 229*  BUN 36*  CREATININE 2.33*  CALCIUM 8.8*   GFR: Estimated Creatinine Clearance: 35.1 mL/min (A) (by C-G formula based on SCr of 2.33 mg/dL (H)). Liver Function Tests: No results for input(s): AST, ALT, ALKPHOS, BILITOT, PROT, ALBUMIN in the last 168 hours. No results for input(s): LIPASE, AMYLASE in the last 168 hours. No results for input(s): AMMONIA in the last 168 hours. Coagulation Profile: Recent Labs  Lab 03/18/20 1349  INR 1.2   Cardiac Enzymes: No results for input(s): CKTOTAL, CKMB, CKMBINDEX, TROPONINI in the last 168 hours. BNP (last 3 results) No results for input(s): PROBNP in the last 8760 hours. HbA1C: No results for input(s): HGBA1C in the last 72 hours. CBG: Recent Labs  Lab 03/18/20 1556  GLUCAP 225*   Lipid Profile: No results for input(s): CHOL, HDL, LDLCALC, TRIG, CHOLHDL, LDLDIRECT in the last 72 hours. Thyroid Function Tests: No results for input(s): TSH, T4TOTAL, FREET4, T3FREE, THYROIDAB in the last 72 hours. Anemia Panel: No results  for input(s): VITAMINB12, FOLATE, FERRITIN, TIBC, IRON, RETICCTPCT in the last 72 hours. Urine analysis:    Component Value Date/Time   COLORURINE YELLOW (A) 03/18/2020 1212   APPEARANCEUR HAZY (A) 03/18/2020 1212   LABSPEC 1.015 03/18/2020 1212   PHURINE 5.0 03/18/2020 1212   GLUCOSEU >=500 (A) 03/18/2020 1212   HGBUR MODERATE (A) 03/18/2020 1212   BILIRUBINUR NEGATIVE 03/18/2020 1212   KETONESUR NEGATIVE 03/18/2020 1212   PROTEINUR >=300 (A) 03/18/2020 1212   NITRITE NEGATIVE 03/18/2020 1212   LEUKOCYTESUR NEGATIVE 03/18/2020 1212   Sepsis Labs: @LABRCNTIP (procalcitonin:4,lacticidven:4) ) Recent Results (from the past 240 hour(s))  Respiratory Panel by RT PCR (Flu A&B, Covid) - Nasopharyngeal Swab     Status: None   Collection Time: 03/18/20  2:23 PM   Specimen: Nasopharyngeal Swab  Result Value Ref Range Status   SARS Coronavirus 2 by RT PCR NEGATIVE NEGATIVE Final    Comment: (NOTE) SARS-CoV-2 target nucleic acids are NOT DETECTED. The SARS-CoV-2 RNA is generally detectable in upper respiratoy specimens during the acute phase of infection. The lowest concentration of SARS-CoV-2 viral copies this assay can detect is 131 copies/mL. A negative result does not preclude SARS-Cov-2 infection and should not be used as the sole basis for treatment or other patient management decisions. A negative result may occur with  improper specimen collection/handling, submission of specimen other than nasopharyngeal swab, presence of viral mutation(s) within the areas targeted by this assay, and inadequate number of viral copies (<131 copies/mL). A negative result must be combined with clinical observations, patient history, and epidemiological information. The expected result is Negative. Fact Sheet for Patients:  PinkCheek.be Fact Sheet for Healthcare Providers:  GravelBags.it This test is not yet ap proved or cleared by the  [place] FDA and  has been authorized for detection and/or diagnosis of SARS-CoV-2 by FDA under an Emergency Use Authorization (EUA). This EUA will remain  in effect (meaning this test can be used) for the duration of the COVID-19 declaration under Section 564(b)(1) of the Act, 21 U.S.C. section 360bbb-3(b)(1), unless the authorization is terminated or revoked sooner.    Influenza A by PCR NEGATIVE NEGATIVE Final   Influenza B by PCR NEGATIVE NEGATIVE Final    Comment: (NOTE) The Xpert Xpress SARS-CoV-2/FLU/RSV assay is  intended as an aid in  the diagnosis of influenza from Nasopharyngeal swab specimens and  should not be used as a sole basis for treatment. Nasal washings and  aspirates are unacceptable for Xpert Xpress SARS-CoV-2/FLU/RSV  testing. Fact Sheet for Patients: PinkCheek.be Fact Sheet for Healthcare Providers: GravelBags.it This test is not yet approved or cleared by the [place] FDA and  has been authorized for detection and/or diagnosis of SARS-CoV-2 by  FDA under an Emergency Use Authorization (EUA). This EUA will remain  in effect (meaning this test can be used) for the duration of the  Covid-19 declaration under Section 564(b)(1) of the Act, 21  U.S.C. section 360bbb-3(b)(1), unless the authorization is  terminated or revoked. Performed at [facility], [address] [place],  [address]      Radiological Exams on Admission: DG Chest 2 View  Result Date: 03/18/2020 CLINICAL DATA:  Shortness of breath, LEFT side numbness since Friday, has UTI, history coronary artery disease post MI and bypass, hypertension, diabetes mellitus EXAM: CHEST - 2 VIEW COMPARISON:  03/18/2018 FINDINGS: Enlargement of cardiac silhouette post CABG. Pulmonary vascular congestion. Atherosclerotic calcification aorta. Interstitial infiltrates in both lungs new since previous exam favoring pulmonary edema and  CHF. Small LEFT pleural effusion. No pneumothorax or acute osseous findings. IMPRESSION: Enlargement of cardiac silhouette post CABG with pulmonary vascular congestion and pulmonary edema consistent with CHF. Small LEFT pleural effusion. Electronically Signed   By: Lavonia Dana M.D.   On: 03/18/2020 12:52   CT Head Wo Contrast  Result Date: 03/18/2020 CLINICAL DATA:  Left-sided numbness. EXAM: CT HEAD WITHOUT CONTRAST TECHNIQUE: Contiguous axial images were obtained from the base of the skull through the vertex without intravenous contrast. COMPARISON:  07/21/2019 FINDINGS: Brain: No evidence of acute infarction, hemorrhage, hydrocephalus, extra-axial collection or mass lesion/mass effect. Old right posterior parietal infarct with secondary encephalomalacia. Tiny old infarcts in the pons and left thalamus. Vascular: No hyperdense vessel or unexpected calcification. Skull: Normal. Negative for fracture or focal lesion. Sinuses/Orbits: Normal. Other: None IMPRESSION: No acute abnormality. Old infarcts. Electronically Signed   By: Lorriane Shire M.D.   On: 03/18/2020 13:38     EKG: Independently reviewed.  Sinus rhythm, QTC 499, low voltage, ST depression in lateral leads.   Assessment/Plan Principal Problem:   Acute CHF (congestive heart failure) (HCC) Active Problems:   Anemia in chronic kidney disease   CKD (chronic kidney disease), stage IV (HCC)   Coronary artery disease   Hyperlipidemia, unspecified   HTN (hypertension)   Hypokalemia   Stroke (HCC)   Gout   Leukocytosis   NSTEMI (non-ST elevated myocardial infarction) (HCC)   Left sided numbness   Acute CHF (congestive heart failure) ([place]): Patient does not carry diagnosis of CHF, but she is taking Lasix 40 mg twice daily at home.  2D echo 07/22/2019 showed EF of 50-55%.  Patient has elevated BNP 851, 2+ bilateral leg edema, chest x-ray showed cardiomegaly and pulmonary edema, clinically consistent with acute CHF.  Patient possibly has  diastolic congestive heart failure.  -will admit to tele bed as inpt. -Lasix 40 mg bid by IV -2d echo -Daily weights -strict I/O's -Low salt diet -Fluid restriction -Obtain REDs Vest reading  Anemia in chronic kidney disease: Hgb 10.8 on 07/21/19 -->9.7 -f/u CBC  CKD (chronic kidney disease), stage IV ([place]): Slightly worsening.  Baseline creatinine 1.7-2.0.  Her creatinine is at 2.33, BUN 36 -Follow-up of BMP  Hx of Coronary artery disease and NSTEMI: s/p of  CABG. Trop 2275. Dr. Clayborn Bigness is consulted.  -Aspirin, statin, fenofibrate, Imdur, metoprolol -IV heparin started -As needed nitroglycerin, morphine -Trend troponin -Follow-up 2D echo  Hyperlipidemia, unspecified -Fenofibrate, statin  HTN:  -Continue home medications: Amlodipine, Cozaar, metoprolol -hydralazine prn  Hypokalemia: K= 3.0  on admission. - Repleted - Check Mg level  Hx of Stroke and Left sided numbness -continue aspirin, Plavix, statin -Follow-up MRI of brain to rule out new stroke  Gout -Allopurinol  Leukocytosis: WBC 13.0.  Patient complains of dysuria, but urinalysis not impressive. -Labs urine culture       Inpatient status:  # Patient requires inpatient status due to high intensity of service, high risk for further deterioration and high frequency of surveillance required.  I certify that at the point of admission it is my clinical judgment that the patient will require inpatient hospital care spanning beyond 2 midnights from the point of admission.  . This patient has multiple chronic comorbidities including hypertension, hyperlipidemia, diabetes mellitus, stroke, ,ced,h, CKD-4, anemia. . Now patient has presenting with acute CHF, NSTEMI and left sided numbness. . The worrisome physical exam findings include 2+ bilateral lower leg edema . The initial radiographic and laboratory data are worrisome because of elevated troponin, elevated BNP, worsening renal function, hypokalemia.  Chest x-ray  showed cardiomegaly and pulmonary edema. . Current medical needs: please see my assessment and plan . Predictability of an adverse outcome (risk): Patient has multiple comorbidities as listed above. Now presents with acute CHF, NSTEMI and left sided numbness. Patient's presentation is highly complicated.  Patient is at high risk of deteriorating.  Will need to be treated in hospital for at least 2 days.            DVT ppx: on IV Heparin   Code Status: Full codeno Family Communication: not done, no family member is at bed side.      Disposition Plan:  Anticipate discharge back to previous home environment Consults called:  Dr. Clayborn Bigness of card Admission status: Tele bed as inpt    Date of Service 03/18/2020    [place] [facility]   If 7PM-7AM, please contact night-coverage [email or web address] 03/18/2020, 4:41 PM

## 2020-03-19 ENCOUNTER — Encounter: Payer: Self-pay | Admitting: Internal Medicine

## 2020-03-19 DIAGNOSIS — I5031 Acute diastolic (congestive) heart failure: Secondary | ICD-10-CM

## 2020-03-19 DIAGNOSIS — I6350 Cerebral infarction due to unspecified occlusion or stenosis of unspecified cerebral artery: Secondary | ICD-10-CM

## 2020-03-19 LAB — CBC
HCT: 28.6 % — ABNORMAL LOW (ref 36.0–46.0)
Hemoglobin: 9.6 g/dL — ABNORMAL LOW (ref 12.0–15.0)
MCH: 29.2 pg (ref 26.0–34.0)
MCHC: 33.6 g/dL (ref 30.0–36.0)
MCV: 86.9 fL (ref 80.0–100.0)
Platelets: 340 10*3/uL (ref 150–400)
RBC: 3.29 MIL/uL — ABNORMAL LOW (ref 3.87–5.11)
RDW: 14.9 % (ref 11.5–15.5)
WBC: 14.7 10*3/uL — ABNORMAL HIGH (ref 4.0–10.5)
nRBC: 0 % (ref 0.0–0.2)

## 2020-03-19 LAB — ECHOCARDIOGRAM COMPLETE
Height: 68 in
Weight: 4092.8 oz

## 2020-03-19 LAB — GLUCOSE, CAPILLARY
Glucose-Capillary: 197 mg/dL — ABNORMAL HIGH (ref 70–99)
Glucose-Capillary: 135 mg/dL — ABNORMAL HIGH (ref 70–99)
Glucose-Capillary: 155 mg/dL — ABNORMAL HIGH (ref 70–99)
Glucose-Capillary: 238 mg/dL — ABNORMAL HIGH (ref 70–99)
Glucose-Capillary: 135 mg/dL — ABNORMAL HIGH (ref 70–99)
Glucose-Capillary: 200 mg/dL — ABNORMAL HIGH (ref 70–99)
Glucose-Capillary: 205 mg/dL — ABNORMAL HIGH (ref 70–99)
Glucose-Capillary: 128 mg/dL — ABNORMAL HIGH (ref 70–99)

## 2020-03-19 LAB — MAGNESIUM: Magnesium: 1.8 mg/dL (ref 1.7–2.4)

## 2020-03-19 LAB — HIV ANTIBODY (ROUTINE TESTING W REFLEX): HIV Screen 4th Generation wRfx: NONREACTIVE

## 2020-03-19 LAB — BASIC METABOLIC PANEL
Anion gap: 12 (ref 5–15)
BUN: 41 mg/dL — ABNORMAL HIGH (ref 6–20)
CO2: 21 mmol/L — ABNORMAL LOW (ref 22–32)
Calcium: 8.9 mg/dL (ref 8.9–10.3)
Chloride: 106 mmol/L (ref 98–111)
Creatinine, Ser: 2.42 mg/dL — ABNORMAL HIGH (ref 0.44–1.00)
GFR calc Af Amer: 25 mL/min — ABNORMAL LOW (ref >60)
GFR calc non Af Amer: 21 mL/min — ABNORMAL LOW (ref >60)
Glucose, Bld: 171 mg/dL — ABNORMAL HIGH (ref 70–99)
Potassium: 3 mmol/L — ABNORMAL LOW (ref 3.5–5.1)
Sodium: 139 mmol/L (ref 135–145)

## 2020-03-19 LAB — LIPID PANEL
Cholesterol: 116 mg/dL (ref 0–200)
HDL: 40 mg/dL — ABNORMAL LOW (ref >40)
LDL Cholesterol: 46 mg/dL (ref 0–99)
Total CHOL/HDL Ratio: 2.9 RATIO
Triglycerides: 149 mg/dL (ref <150)
VLDL: 30 mg/dL (ref 0–40)

## 2020-03-19 LAB — HEPARIN LEVEL (UNFRACTIONATED)
Heparin Unfractionated: 0.11 IU/mL — ABNORMAL LOW (ref 0.30–0.70)
Heparin Unfractionated: 0.3 IU/mL (ref 0.30–0.70)
Heparin Unfractionated: 0.18 IU/mL — ABNORMAL LOW (ref 0.30–0.70)

## 2020-03-19 LAB — TROPONIN I (HIGH SENSITIVITY)
Troponin I (High Sensitivity): 1432 ng/L (ref <18)
Troponin I (High Sensitivity): 1305 ng/L (ref <18)

## 2020-03-19 MED ORDER — HEPARIN BOLUS VIA INFUSION
2000.0000 [IU] | Freq: Once | INTRAVENOUS | Status: AC
  Administered 2020-03-19: 2000 [IU] via INTRAVENOUS
  Filled 2020-03-19: qty 2000

## 2020-03-19 MED ORDER — POTASSIUM CHLORIDE CRYS ER 20 MEQ PO TBCR
40.0000 meq | EXTENDED_RELEASE_TABLET | ORAL | Status: AC
  Administered 2020-03-19 (×2): 40 meq via ORAL
  Filled 2020-03-19 (×2): qty 2

## 2020-03-19 MED ORDER — MAGNESIUM OXIDE 400 (241.3 MG) MG PO TABS
400.0000 mg | ORAL_TABLET | Freq: Two times a day (BID) | ORAL | Status: AC
  Administered 2020-03-19 – 2020-03-20 (×4): 400 mg via ORAL
  Filled 2020-03-19 (×4): qty 1

## 2020-03-19 MED ORDER — HEPARIN BOLUS VIA INFUSION
2500.0000 [IU] | Freq: Once | INTRAVENOUS | Status: AC
  Administered 2020-03-19: 2500 [IU] via INTRAVENOUS
  Filled 2020-03-19: qty 2500

## 2020-03-19 NOTE — Progress Notes (Signed)
ANTICOAGULATION CONSULT NOTE  Pharmacy Consult for heparin Indication: NSTEMI  No Known Allergies  Patient Measurements: Height: 5\' 8"  (172.7 cm) Weight: 255 lb 12.8 oz (116 kg) IBW/kg (Calculated) : 63.9 Heparin Dosing Weight: 90 kg  Vital Signs: Temp: 98.3 F (36.8 C) (03/31 1931) Temp Source: Oral (03/31 1931) BP: 148/56 (03/31 1931) Pulse Rate: 64 (03/31 1931)  Labs: Recent Labs    03/18/20 1212 03/18/20 1349 03/18/20 1447 03/18/20 2032 03/18/20 2032 03/19/20 0432 03/19/20 0806 03/19/20 0951 03/19/20 1206 03/19/20 1855  HGB 9.7*  --   --   --   --  9.6*  --   --   --   --   HCT 28.3*  --   --   --   --  28.6*  --   --   --   --   PLT 322  --   --   --   --  340  --   --   --   --   APTT  --  44*  --   --   --   --   --   --   --   --   LABPROT  --  15.5*  --   --   --   --   --   --   --   --   INR  --  1.2  --   --   --   --   --   --   --   --   HEPARINUNFRC  --   --   --  <0.10*   < > 0.11*  --   --  0.30 0.18*  CREATININE 2.33*  --   --   --   --  2.42*  --   --   --   --   TROPONINIHS 2,275*  --    < > 2,094*  --   --  1,432* 1,305*  --   --    < > = values in this interval not displayed.    Estimated Creatinine Clearance: 33.9 mL/min (A) (by C-G formula based on SCr of 2.42 mg/dL (H)).   Medical History: Past Medical History:  Diagnosis Date  . Anemia in chronic kidney disease 05/19/2016  . CAD (coronary artery disease)   . Chicken pox   . Chronic kidney disease    per dr sparks  . CVA (cerebral vascular accident) ([place])   . Detached retina   . Diabetes mellitus without complication ([place])    type 2  . Diabetic neuropathy ([place])   . Diabetic retinopathy ([place])    legally blind  . Hyperlipidemia   . Hypertension   . Myocardial infarction ([place])   . Obesity   . PONV (postoperative nausea and vomiting)   . Sciatica of right side    going to see physiciatry    Assessment: 59 year old female presented with SOB and left-sided numbness. Initial  troponin 2275 with trend up to 2884. Patient is not on anticoagulation PTA. Pharmacy consulted for heparin drip for NSTEMI.  3/31 0432 HL 0.11  3/31 1206 HL 0.3 3/31 1855 HL 0.18  Goal of Therapy:  Heparin level 0.3-0.7 units/ml Monitor platelets by anticoagulation protocol: Yes   Plan:  HL subtherapeutic, trended down. Spoke with nurse, no issues with line, IV site. No pauses in heparin that she is aware of. Will give heparin 2000 unit bolus followed by increase in rate to 1900 units/hr. HL 4/1 at  0300. CBC daily.  Tawnya Crook, PharmD 03/19/2020,8:06 PM

## 2020-03-19 NOTE — Plan of Care (Signed)
  Problem: Clinical Measurements: Goal: Respiratory complications will improve Outcome: Not Progressing Note: Patient is dyspneic with minimal exertion.

## 2020-03-19 NOTE — Progress Notes (Deleted)
ANTICOAGULATION CONSULT NOTE  Pharmacy Consult for heparin Indication: NSTEMI  No Known Allergies  Patient Measurements: Height: 5\' 8"  (172.7 cm) Weight: 255 lb 12.8 oz (116 kg) IBW/kg (Calculated) : 63.9 Heparin Dosing Weight: 90 kg  Vital Signs: Temp: 97.8 F (36.6 C) (03/31 1132) Temp Source: Oral (03/31 0719) BP: 164/70 (03/31 1132) Pulse Rate: 60 (03/31 1132)  Labs: Recent Labs    03/18/20 1212 03/18/20 1349 03/18/20 1447 03/18/20 2032 03/19/20 0432 03/19/20 0806 03/19/20 0951 03/19/20 1206  HGB 9.7*  --   --   --  9.6*  --   --   --   HCT 28.3*  --   --   --  28.6*  --   --   --   PLT 322  --   --   --  340  --   --   --   APTT  --  44*  --   --   --   --   --   --   LABPROT  --  15.5*  --   --   --   --   --   --   INR  --  1.2  --   --   --   --   --   --   HEPARINUNFRC  --   --   --  <0.10* 0.11*  --   --  0.30  CREATININE 2.33*  --   --   --  2.42*  --   --   --   TROPONINIHS 2,275*  --    < > 2,094*  --  1,432* 1,305*  --    < > = values in this interval not displayed.    Estimated Creatinine Clearance: 33.9 mL/min (A) (by C-G formula based on SCr of 2.42 mg/dL (H)).   Medical History: Past Medical History:  Diagnosis Date  . Anemia in chronic kidney disease 05/19/2016  . CAD (coronary artery disease)   . Chicken pox   . Chronic kidney disease    per dr sparks  . CVA (cerebral vascular accident) ([place])   . Detached retina   . Diabetes mellitus without complication ([place])    type 2  . Diabetic neuropathy ([place])   . Diabetic retinopathy ([place])    legally blind  . Hyperlipidemia   . Hypertension   . Myocardial infarction ([place])   . Obesity   . PONV (postoperative nausea and vomiting)   . Sciatica of right side    going to see physiciatry    Assessment: 59 year old female presented with SOB and left-sided numbness. Initial troponin 2275 with trend up to 2884. Patient is not on anticoagulation PTA. Pharmacy consulted for heparin drip for  NSTEMI.  Goal of Therapy:  Heparin level 0.3-0.7 units/ml Monitor platelets by anticoagulation protocol: Yes   Plan: -3/31 @ 1206 HL: 0.30, therapeutic. Will maintain current rate and re-check level in 6 hours -Daily CBC per protocol   [place] Resident 03/19/2020,1:24 PM

## 2020-03-19 NOTE — Progress Notes (Addendum)
Progress Note    Sharon Arias  VZD:[id number] DOB: Mar 30, 1961  DOA: 03/18/2020 PCP: Idelle Crouch, MD      Brief Narrative:    Medical records reviewed and are as summarized below:  Sharon Arias is an 59 y.o. female with medical history significant of hypertension, hyperlipidemia, diabetes mellitus, stroke, history of stroke, CKD-4, anemia, who presented with shortness breath.  Patient said that she had been having shortness of breath for more than 4 days, which has been progressively worsening she also complained of left-sided numbness, and tingling in left hand.      Assessment/Plan:   Principal Problem:   Acute CHF (congestive heart failure) (HCC) Active Problems:   Anemia in chronic kidney disease   CKD (chronic kidney disease), stage IV (HCC)   Coronary artery disease   Hyperlipidemia, unspecified   HTN (hypertension)   Hypokalemia   Right pontine stroke (HCC)   Gout   Leukocytosis   NSTEMI (non-ST elevated myocardial infarction) (HCC)   Left sided numbness   Acute on chronic diastolic CHF (congestive heart failure) ([place]):  continue IV Lasix.  2D echo showed EF estimated at 55 to 60% and grade 2 diastolic dysfunction -Monitor daily weight, intake and output and BMP.  Low-salt diet.  Fluid restriction Monitor REDs Vest reading  Anemia in chronic kidney disease: Hgb 10.8 on 07/21/19 -->9.7 H&H stable  CKD (chronic kidney disease), stage IV ([place]): Slightly worsening.  Baseline creatinine 1.7-2.0.  -Follow-up of BMP  Hx of Coronary artery disease and NSTEMI: s/p of CABG. Trop 2275. -Aspirin, statin, fenofibrate, Imdur, metoprolol Continue IV heparin infusion and monitor PTT per protocol -As needed nitroglycerin, morphine Follow-up with cardiologist for further recommendations  Hyperlipidemia, unspecified -Fenofibrate, statin  HTN:  -Continue home medications: Amlodipine, Cozaar, metoprolol -hydralazine prn  Hypokalemia: K= 3.0  on  admission. - Repleted - Check Mg level  Hx of Stroke/new acute right pontine infarct with left sided numbness -continue aspirin, Plavix, statin Consulted neurologist, Dr. Doy Mince for further recommendations  Gout -Allopurinol  Leukocytosis:  This is likely reactive.  Monitor CBC       Body mass index is 38.89 kg/m.   Family Communication/Anticipated D/C date and plan/Code Status   DVT prophylaxis: IV heparin infusion Code Status: Full code Family Communication: Plan discussed with patient Disposition Plan: Patient is from home.  Plan to discharge home when cleared by cardiologist.      Subjective:   C/o numbness in the left arm and leg.  She still has swelling in her legs.  Breathing is better.  No chest pain.  Objective:    Vitals:   03/18/20 2006 03/19/20 0350 03/19/20 0719 03/19/20 1132  BP: (!) 153/61 (!) 169/70 (!) 172/62 (!) 164/70  Pulse: 70 66 66 60  Resp: 16 15 17 17   Temp: 97.8 F (36.6 C) 99.1 F (37.3 C) 98.5 F (36.9 C) 97.8 F (36.6 C)  TempSrc: Oral Oral Oral   SpO2: 97% 94% 95% 96%  Weight:      Height:        Intake/Output Summary (Last 24 hours) at 03/19/2020 1522 Last data filed at 03/19/2020 1029 Gross per 24 hour  Intake 402.75 ml  Output 1100 ml  Net -697.25 ml   Filed Weights   03/18/20 1204 03/18/20 1819  Weight: 115.7 kg 116 kg    Exam:  GEN: NAD SKIN: No rash EYES: EOMI ENT: MMM CV: RRR PULM: CTA B ABD: soft, ND, NT, +BS CNS:  AAO x 3, non focal EXT: b/l leg edema (2+ ), no tenderness   Data Reviewed:   I have personally reviewed following labs and imaging studies:  Labs: Labs show the following:   Basic Metabolic Panel: Recent Labs  Lab 03/18/20 1212 03/19/20 0432  NA 140 139  K 3.0* 3.0*  CL 105 106  CO2 22 21*  GLUCOSE 229* 171*  BUN 36* 41*  CREATININE 2.33* 2.42*  CALCIUM 8.8* 8.9  MG  --  1.8   GFR Estimated Creatinine Clearance: 33.9 mL/min (A) (by C-G formula based on SCr of  2.42 mg/dL (H)). Liver Function Tests: No results for input(s): AST, ALT, ALKPHOS, BILITOT, PROT, ALBUMIN in the last 168 hours. No results for input(s): LIPASE, AMYLASE in the last 168 hours. No results for input(s): AMMONIA in the last 168 hours. Coagulation profile Recent Labs  Lab 03/18/20 1349  INR 1.2    CBC: Recent Labs  Lab 03/18/20 1212 03/19/20 0432  WBC 13.0* 14.7*  NEUTROABS 10.0*  --   HGB 9.7* 9.6*  HCT 28.3* 28.6*  MCV 85.5 86.9  PLT 322 340   Cardiac Enzymes: No results for input(s): CKTOTAL, CKMB, CKMBINDEX, TROPONINI in the last 168 hours. BNP (last 3 results) No results for input(s): PROBNP in the last 8760 hours. CBG: Recent Labs  Lab 03/18/20 2038 03/18/20 2220 03/19/20 0348 03/19/20 0718 03/19/20 1131  GLUCAP 229* 185* 135* 155* 238*   D-Dimer: No results for input(s): DDIMER in the last 72 hours. Hgb A1c: Recent Labs    03/18/20 1447  HGBA1C 7.7*   Lipid Profile: Recent Labs    03/19/20 0432  CHOL 116  HDL 40*  LDLCALC 46  TRIG 149  CHOLHDL 2.9   Thyroid function studies: No results for input(s): TSH, T4TOTAL, T3FREE, THYROIDAB in the last 72 hours.  Invalid input(s): FREET3 Anemia work up: No results for input(s): VITAMINB12, FOLATE, FERRITIN, TIBC, IRON, RETICCTPCT in the last 72 hours. Sepsis Labs: Recent Labs  Lab 03/18/20 1212 03/19/20 0432  WBC 13.0* 14.7*    Microbiology Recent Results (from the past 240 hour(s))  Urine culture     Status: Abnormal (Preliminary result)   Collection Time: 03/18/20 12:12 PM   Specimen: Urine, Random  Result Value Ref Range Status   Specimen Description   Final    URINE, RANDOM Performed at [facility], [address], [place], [place] [address]    Special Requests   Final    NONE Performed at [facility], [address] [place], [place] [address]    Culture (A)  Final    >=100,000 COLONIES/mL ENTEROCOCCUS FAECALIS SUSCEPTIBILITIES TO  FOLLOW Performed at [facility], [place] [address], [place], [place] [address]    Report Status PENDING  Incomplete  Respiratory Panel by RT PCR (Flu A&B, Covid) - Nasopharyngeal Swab     Status: None   Collection Time: 03/18/20  2:23 PM   Specimen: Nasopharyngeal Swab  Result Value Ref Range Status   SARS Coronavirus 2 by RT PCR NEGATIVE NEGATIVE Final    Comment: (NOTE) SARS-CoV-2 target nucleic acids are NOT DETECTED. The SARS-CoV-2 RNA is generally detectable in upper respiratoy specimens during the acute phase of infection. The lowest concentration of SARS-CoV-2 viral copies this assay can detect is 131 copies/mL. A negative result does not preclude SARS-Cov-2 infection and should not be used as the sole basis for treatment or other patient management decisions. A negative result may occur with  improper  specimen collection/handling, submission of specimen other than nasopharyngeal swab, presence of viral mutation(s) within the areas targeted by this assay, and inadequate number of viral copies (<131 copies/mL). A negative result must be combined with clinical observations, patient history, and epidemiological information. The expected result is Negative. Fact Sheet for Patients:  PinkCheek.be Fact Sheet for Healthcare Providers:  GravelBags.it This test is not yet ap proved or cleared by the [place] FDA and  has been authorized for detection and/or diagnosis of SARS-CoV-2 by FDA under an Emergency Use Authorization (EUA). This EUA will remain  in effect (meaning this test can be used) for the duration of the COVID-19 declaration under Section 564(b)(1) of the Act, 21 U.S.C. section 360bbb-3(b)(1), unless the authorization is terminated or revoked sooner.    Influenza A by PCR NEGATIVE NEGATIVE Final   Influenza B by PCR NEGATIVE NEGATIVE Final    Comment: (NOTE) The Xpert Xpress SARS-CoV-2/FLU/RSV assay is  intended as an aid in  the diagnosis of influenza from Nasopharyngeal swab specimens and  should not be used as a sole basis for treatment. Nasal washings and  aspirates are unacceptable for Xpert Xpress SARS-CoV-2/FLU/RSV  testing. Fact Sheet for Patients: PinkCheek.be Fact Sheet for Healthcare Providers: GravelBags.it This test is not yet approved or cleared by the [place] FDA and  has been authorized for detection and/or diagnosis of SARS-CoV-2 by  FDA under an Emergency Use Authorization (EUA). This EUA will remain  in effect (meaning this test can be used) for the duration of the  Covid-19 declaration under Section 564(b)(1) of the Act, 21  U.S.C. section 360bbb-3(b)(1), unless the authorization is  terminated or revoked. Performed at [facility], [address] [place], [place] [address]     Procedures and diagnostic studies:  DG Chest 2 View  Result Date: 03/18/2020 CLINICAL DATA:  Shortness of breath, LEFT side numbness since Friday, has UTI, history coronary artery disease post MI and bypass, hypertension, diabetes mellitus EXAM: CHEST - 2 VIEW COMPARISON:  03/18/2018 FINDINGS: Enlargement of cardiac silhouette post CABG. Pulmonary vascular congestion. Atherosclerotic calcification aorta. Interstitial infiltrates in both lungs new since previous exam favoring pulmonary edema and CHF. Small LEFT pleural effusion. No pneumothorax or acute osseous findings. IMPRESSION: Enlargement of cardiac silhouette post CABG with pulmonary vascular congestion and pulmonary edema consistent with CHF. Small LEFT pleural effusion. Electronically Signed   By: Lavonia Dana M.D.   On: 03/18/2020 12:52   CT Head Wo Contrast  Result Date: 03/18/2020 CLINICAL DATA:  Left-sided numbness. EXAM: CT HEAD WITHOUT CONTRAST TECHNIQUE: Contiguous axial images were obtained from the base of the skull through the vertex without intravenous  contrast. COMPARISON:  07/21/2019 FINDINGS: Brain: No evidence of acute infarction, hemorrhage, hydrocephalus, extra-axial collection or mass lesion/mass effect. Old right posterior parietal infarct with secondary encephalomalacia. Tiny old infarcts in the pons and left thalamus. Vascular: No hyperdense vessel or unexpected calcification. Skull: Normal. Negative for fracture or focal lesion. Sinuses/Orbits: Normal. Other: None IMPRESSION: No acute abnormality. Old infarcts. Electronically Signed   By: Lorriane Shire M.D.   On: 03/18/2020 13:38   MR BRAIN WO CONTRAST  Result Date: 03/18/2020 CLINICAL DATA:  Old infarcts. Left-sided numbness. EXAM: MRI HEAD WITHOUT CONTRAST TECHNIQUE: Multiplanar, multiecho pulse sequences of the brain and surrounding structures were obtained without intravenous contrast. COMPARISON:  Brain MRI 07/21/2019 FINDINGS: BRAIN: Small focus of acute ischemia within the dorsal right pons. No other diffusion abnormality. No acute hemorrhage. There are multiple old infarcts of the  deep gray nuclei and cerebellum. There is an old posterior right MCA territory infarct. Multifocal white matter hyperintensity, most commonly due to chronic ischemic microangiopathy. Normal volume of brain parenchyma and CSF spaces. Midline structures are normal. VASCULAR: Major flow voids are preserved. Susceptibility-sensitive sequences show no chronic microhemorrhage or superficial siderosis. SKULL AND UPPER CERVICAL SPINE: Normal calvarium and skull base. Visualized upper cervical spine and soft tissues are normal. SINUSES/ORBITS: No fluid levels or advanced mucosal thickening. No mastoid or middle ear effusion. Normal orbits. IMPRESSION: 1. Small focus of acute ischemia within the dorsal right pons. No hemorrhage or mass effect. 2. Multiple old infarcts and findings of chronic small vessel ischemia. Electronically Signed   By: Ulyses Jarred M.D.   On: 03/18/2020 22:09   ECHOCARDIOGRAM COMPLETE  Result  Date: 03/19/2020    ECHOCARDIOGRAM REPORT   Patient Name:   Sharon Arias Date of Exam: 03/18/2020 Medical Rec #:  [id number]      Height:       68.0 in Accession #:    [id number]     Weight:       255.8 lb Date of Birth:  1961/04/17       BSA:          2.269 m Patient Age:    53 years       BP:           176/73 mmHg Patient Gender: F              HR:           68 bpm. Exam Location:  [facility] Procedure: 2D Echo, Cardiac Doppler and Color Doppler Indications:     X41.28 Acute Diastolic CHF  History:         Patient has prior history of Echocardiogram examinations, most                  recent 07/22/2019. Risk Factors:Diabetes and Morbid Obesity.                  Myocardial Infarction. CVA. Chronic kidney disease. Coronary                  artery disease.  Sonographer:     Wilford Sports Rodgers-Jones Referring Phys:  Unknown Foley NIU Diagnosing Phys: Nelva Bush MD IMPRESSIONS  1. Left ventricular ejection fraction, by estimation, is 55 to 60%. The left ventricle has normal function. The left ventricle has no regional wall motion abnormalities. Left ventricular diastolic parameters are consistent with Grade II diastolic dysfunction (pseudonormalization). Elevated left atrial pressure.  2. Right ventricular systolic function is mildly reduced. The right ventricular size is normal.  3. Left atrial size was mildly dilated.  4. There is irregular thickening of the interatrial septum, incompletely characterized on this study.  5. The mitral valve is degenerative. Trivial mitral valve regurgitation. No evidence of mitral stenosis.  6. The aortic valve was not well visualized. Aortic valve regurgitation is not visualized. No aortic stenosis is present.  7. The inferior vena cava is normal in size with greater than 50% respiratory variability, suggesting right atrial pressure of 3 mmHg. FINDINGS  Left Ventricle: Left ventricular ejection fraction, by estimation, is 55 to 60%. The left ventricle has normal function. The left ventricle  has no regional wall motion abnormalities. The left ventricular internal cavity size was normal in size. There is  no left ventricular hypertrophy. Left ventricular diastolic parameters are consistent with Grade II diastolic dysfunction (pseudonormalization). Elevated left atrial  pressure. Right Ventricle: The right ventricular size is normal. Right vetricular wall thickness was not assessed. Right ventricular systolic function is mildly reduced. Left Atrium: Left atrial size was mildly dilated. Right Atrium: Right atrial size was normal in size. Pericardium: There is no evidence of pericardial effusion. Mitral Valve: The mitral valve is degenerative in appearance. There is mild thickening of the mitral valve leaflet(s). There is mild calcification of the mitral valve leaflet(s). Mild mitral annular calcification. Trivial mitral valve regurgitation. No evidence of mitral valve stenosis. Tricuspid Valve: The tricuspid valve is not well visualized. Tricuspid valve regurgitation is trivial. Aortic Valve: The aortic valve was not well visualized. Aortic valve regurgitation is not visualized. No aortic stenosis is present. Pulmonic Valve: The pulmonic valve was not well visualized. Pulmonic valve regurgitation is trivial. No evidence of pulmonic stenosis. Aorta: The aortic root is normal in size and structure. Pulmonary Artery: The pulmonary artery is not well seen. Venous: The inferior vena cava is normal in size with greater than 50% respiratory variability, suggesting right atrial pressure of 3 mmHg. IAS/Shunts: The interatrial septum was not well visualized.  LEFT VENTRICLE PLAX 2D LVIDd:         4.06 cm Diastology LVIDs:         2.74 cm LV e' lateral:   7.89 cm/s LV PW:         0.96 cm LV E/e' lateral: 15.3 LV IVS:        0.92 cm LV e' medial:    5.34 cm/s                        LV E/e' medial:  22.7  RIGHT VENTRICLE RV Basal diam:  3.77 cm RV S prime:     10.58 cm/s TAPSE (M-mode): 1.8 cm LEFT ATRIUM              Index       RIGHT ATRIUM           Index LA diam:        4.80 cm 2.12 cm/m  RA Area:     13.50 cm LA Vol (A2C):   68.2 ml 30.06 ml/m RA Volume:   32.80 ml  14.46 ml/m LA Vol (A4C):   64.9 ml 28.60 ml/m LA Biplane Vol: 69.4 ml 30.59 ml/m   AORTA Ao Root diam: 3.10 cm MITRAL VALVE MV Area (PHT): 3.48 cm MV Decel Time: 218 msec MV E velocity: 121.00 cm/s MV A velocity: 88.10 cm/s MV E/A ratio:  1.37 Nelva Bush MD Electronically signed by Nelva Bush MD Signature Date/Time: 03/19/2020/7:00:37 AM    Final     Medications:   . allopurinol  200 mg Oral QHS  . allopurinol  300 mg Oral Daily  . amLODipine  10 mg Oral QHS  . aspirin EC  81 mg Oral BH-q7a  . cholecalciferol  400 Units Oral Daily  . clopidogrel  75 mg Oral Daily  . dextromethorphan-guaiFENesin  1 tablet Oral BID  . fenofibrate  160 mg Oral Daily  . furosemide  40 mg Intravenous Q12H  . insulin aspart  0-9 Units Subcutaneous Q4H  . insulin aspart  10 Units Subcutaneous Q breakfast  . insulin aspart  16 Units Subcutaneous Q lunch  . insulin aspart  20 Units Subcutaneous Q supper  . insulin glargine  60 Units Subcutaneous QHS  . isosorbide mononitrate  30 mg Oral Daily  . losartan  100 mg Oral Daily  .  magnesium oxide  400 mg Oral BID  . metoprolol tartrate  100 mg Oral BID  . rosuvastatin  40 mg Oral QHS   Continuous Infusions: . heparin 1,700 Units/hr (03/19/20 1406)     LOS: 1 day   Sire Poet  [facility]     03/19/2020, 3:22 PM

## 2020-03-19 NOTE — Progress Notes (Signed)
ANTICOAGULATION CONSULT NOTE  Pharmacy Consult for heparin Indication: NSTEMI  No Known Allergies  Patient Measurements: Height: 5\' 8"  (172.7 cm) Weight: 255 lb 12.8 oz (116 kg) IBW/kg (Calculated) : 63.9 Heparin Dosing Weight: 90 kg  Vital Signs: Temp: 97.8 F (36.6 C) (03/31 1132) Temp Source: Oral (03/31 0719) BP: 164/70 (03/31 1132) Pulse Rate: 60 (03/31 1132)  Labs: Recent Labs    03/18/20 1212 03/18/20 1349 03/18/20 1447 03/18/20 2032 03/19/20 0432 03/19/20 0806 03/19/20 0951 03/19/20 1206  HGB 9.7*  --   --   --  9.6*  --   --   --   HCT 28.3*  --   --   --  28.6*  --   --   --   PLT 322  --   --   --  340  --   --   --   APTT  --  44*  --   --   --   --   --   --   LABPROT  --  15.5*  --   --   --   --   --   --   INR  --  1.2  --   --   --   --   --   --   HEPARINUNFRC  --   --   --  <0.10* 0.11*  --   --  0.30  CREATININE 2.33*  --   --   --  2.42*  --   --   --   TROPONINIHS 2,275*  --    < > 2,094*  --  1,432* 1,305*  --    < > = values in this interval not displayed.    Estimated Creatinine Clearance: 33.9 mL/min (A) (by C-G formula based on SCr of 2.42 mg/dL (H)).   Medical History: Past Medical History:  Diagnosis Date  . Anemia in chronic kidney disease 05/19/2016  . CAD (coronary artery disease)   . Chicken pox   . Chronic kidney disease    per dr sparks  . CVA (cerebral vascular accident) ([place])   . Detached retina   . Diabetes mellitus without complication ([place])    type 2  . Diabetic neuropathy ([place])   . Diabetic retinopathy ([place])    legally blind  . Hyperlipidemia   . Hypertension   . Myocardial infarction ([place])   . Obesity   . PONV (postoperative nausea and vomiting)   . Sciatica of right side    going to see physiciatry    Assessment: 59 year old female presented with SOB and left-sided numbness. Initial troponin 2275 with trend up to 2884. Patient is not on anticoagulation PTA. Pharmacy consulted for heparin drip for  NSTEMI.  3/31 0432 HL 0.11  3/31 1206 HL 0.3  Goal of Therapy:  Heparin level 0.3-0.7 units/ml Monitor platelets by anticoagulation protocol: Yes   Plan:  Heparin level is therapeutic (lower range of goal). Will increase heparin infusion to 1700 units/hr.  CBC stable. Recheck HL ~ 6 hours. CBC daily while on heparin.   Oswald Hillock, PharmD, BCPS 03/19/2020,1:24 PM

## 2020-03-19 NOTE — Consult Note (Signed)
Requesting Physician: Mal Misty    Chief Complaint: Left sided numbness  I have been asked by Dr. Mal Misty to see this patient in consultation for stroke.  HPI: Sharon Arias is an 59 y.o. female with a medical history significant for hypertension, hyperlipidemia, diabetes mellitus, stroke, CKD-4, anemia, who presents with shortness breath.  Patient also reports awakening on 3/26 at baseline.  During the day the had development of SOB and numbness on the left side of her body.  Symptoms persisted and she presented for evaluation.  Initial NIHSS of 0.  Date last known well: 03/14/2020 Time last known well: Unable to determine tPA Given: No: Unknown LKW, outside time window  Past Medical History:  Diagnosis Date  . Anemia in chronic kidney disease 05/19/2016  . CAD (coronary artery disease)   . Chicken pox   . Chronic kidney disease    per dr sparks  . CVA (cerebral vascular accident) ([place])   . Detached retina   . Diabetes mellitus without complication ([place])    type 2  . Diabetic neuropathy ([place])   . Diabetic retinopathy ([place])    legally blind  . Hyperlipidemia   . Hypertension   . Myocardial infarction ([place])   . Obesity   . PONV (postoperative nausea and vomiting)   . Sciatica of right side    going to see physiciatry    Past Surgical History:  Procedure Laterality Date  . COLONOSCOPY WITH PROPOFOL N/A 01/26/2016   Procedure: COLONOSCOPY WITH PROPOFOL;  Surgeon: Lollie Sails, MD;  Location: [facility] ENDOSCOPY;  Service: Endoscopy;  Laterality: N/A;  . COLONOSCOPY WITH PROPOFOL N/A 01/27/2016   Procedure: COLONOSCOPY WITH PROPOFOL;  Surgeon: Lollie Sails, MD;  Location: [facility] ENDOSCOPY;  Service: Endoscopy;  Laterality: N/A;  . CORONARY ARTERY BYPASS GRAFT  2013  . INCISION AND DRAINAGE     chest abscess  . INCISION AND DRAINAGE ABSCESS N/A 12/11/2018   Procedure: INCISION AND DRAINAGE PERINEAL;  Surgeon: Jules Husbands, MD;  Location: [facility] ORS;  Service: General;  Laterality:  N/A;  . RETINAL LASER PROCEDURE      Family History  Problem Relation Age of Onset  . Breast cancer Mother   . Prostate cancer Father        we think mets to liver and bone   Social History:  reports that she has never smoked. She has never used smokeless tobacco. She reports that she does not drink alcohol or use drugs.  Allergies: No Known Allergies  Medications:  I have reviewed the patient's current medications. Prior to Admission:  Medications Prior to Admission  Medication Sig Dispense Refill Last Dose  . acetaminophen (TYLENOL) 500 MG tablet Take 1,000 mg by mouth every 8 (eight) hours as needed for pain.   PRN at PRN  . allopurinol (ZYLOPRIM) 100 MG tablet Take 200 mg by mouth at bedtime.   03/17/2020 at 2200  . allopurinol (ZYLOPRIM) 300 MG tablet Take 300 mg by mouth daily.   03/18/2020 at 0800  . amLODipine (NORVASC) 10 MG tablet Take 10 mg by mouth at bedtime.   03/17/2020 at 2200  . aspirin EC 81 MG tablet Take 81 mg by mouth every morning.   03/18/2020 at 0800  . Cholecalciferol 10 MCG (400 UNIT) CAPS Take 10 mcg by mouth daily.   03/18/2020 at 0800  . clopidogrel (PLAVIX) 75 MG tablet Take 1 tablet (75 mg total) by mouth daily. 30 tablet 0 03/18/2020 at 0800  . fenofibrate 160 MG  tablet Take 1 tablet (160 mg total) by mouth daily. 30 tablet 0 03/18/2020 at 0800  . Insulin Glargine (LANTUS SOLOSTAR) 100 UNIT/ML Solostar Pen Inject 80 Units into the skin at bedtime.   03/17/2020 at 2200  . insulin lispro (HUMALOG) 100 UNIT/ML KwikPen Inject 15-30 Units into the skin See admin instructions. Pt. Uses 15 un/ml in the mornings, 20un/ml in the afternoon, and 30 un/ml at bedtime   03/18/2020 at Unknown time  . isosorbide mononitrate (IMDUR) 30 MG 24 hr tablet Take 30 mg by mouth daily.   03/18/2020 at 0800  . losartan (COZAAR) 100 MG tablet Take 100 mg by mouth daily.   03/18/2020 at 0800  . metoprolol tartrate (LOPRESSOR) 100 MG tablet Take 100 mg by mouth 2 (two) times a day.     03/18/2020 at 0800  . rosuvastatin (CRESTOR) 40 MG tablet Take 40 mg by mouth at bedtime.    03/17/2020 at 2200   Scheduled: . allopurinol  200 mg Oral QHS  . allopurinol  300 mg Oral Daily  . amLODipine  10 mg Oral QHS  . aspirin EC  81 mg Oral BH-q7a  . cholecalciferol  400 Units Oral Daily  . clopidogrel  75 mg Oral Daily  . dextromethorphan-guaiFENesin  1 tablet Oral BID  . fenofibrate  160 mg Oral Daily  . furosemide  40 mg Intravenous Q12H  . insulin aspart  0-9 Units Subcutaneous Q4H  . insulin aspart  10 Units Subcutaneous Q breakfast  . insulin aspart  16 Units Subcutaneous Q lunch  . insulin aspart  20 Units Subcutaneous Q supper  . insulin glargine  60 Units Subcutaneous QHS  . isosorbide mononitrate  30 mg Oral Daily  . losartan  100 mg Oral Daily  . magnesium oxide  400 mg Oral BID  . metoprolol tartrate  100 mg Oral BID  . rosuvastatin  40 mg Oral QHS    ROS: History obtained from the patient  General ROS: negative for - chills, fatigue, fever, night sweats, weight gain or weight loss Psychological ROS: negative for - behavioral disorder, hallucinations, memory difficulties, mood swings or suicidal ideation Ophthalmic ROS: negative for - blurry vision, double vision, eye pain or loss of vision ENT ROS: negative for - epistaxis, nasal discharge, oral lesions, sore throat, tinnitus or vertigo Allergy and Immunology ROS: negative for - hives or itchy/watery eyes Hematological and Lymphatic ROS: negative for - bleeding problems, bruising or swollen lymph nodes Endocrine ROS: negative for - galactorrhea, hair pattern changes, polydipsia/polyuria or temperature intolerance Respiratory ROS: shortness of breath Cardiovascular ROS: LE edema  Gastrointestinal ROS: negative for - abdominal pain, diarrhea, hematemesis, nausea/vomiting or stool incontinence Genito-Urinary ROS: negative for - dysuria, hematuria, incontinence or urinary frequency/urgency Musculoskeletal ROS:  negative for - joint swelling or muscular weakness Neurological ROS: as noted in HPI Dermatological ROS: negative for rash and skin lesion changes  Physical Examination: Blood pressure (!) 155/70, pulse 62, temperature 97.6 F (36.4 C), resp. rate 17, height 5\' 8"  (1.727 m), weight 116 kg, SpO2 98 %.  HEENT-  Normocephalic, no lesions, without obvious abnormality.  Normal external eye and conjunctiva.  Normal TM's bilaterally.  Normal auditory canals and external ears. Normal external nose, mucus membranes and septum.  Normal pharynx. Cardiovascular- S1, S2 normal, pulses palpable throughout   Lungs- chest clear, no wheezing, rales, normal symmetric air entry Abdomen- soft, non-tender; bowel sounds normal; no masses,  no organomegaly Extremities- BLE edema Lymph-no adenopathy palpable Musculoskeletal-no joint tenderness, deformity  or swelling Skin-warm and dry, no hyperpigmentation, vitiligo, or suspicious lesions  Neurological Examination   Mental Status: Alert, oriented, thought content appropriate.  Speech fluent without evidence of aphasia.  Able to follow 3 step commands without difficulty. Cranial Nerves: II: Visual fields grossly normal, pupils equal, round, reactive to light and accommodation III,IV, VI: ptosis not present, extra-ocular motions intact bilaterally V,VII: smile symmetric, facial light touch sensation normal bilaterally VIII: hearing normal bilaterally IX,X: gag reflex present XI: bilateral shoulder shrug XII: midline tongue extension Motor: Right : Upper extremity   5/5    Left:     Upper extremity   5/5  Lower extremity   5/5     Lower extremity   5/5 Tone and bulk:normal tone throughout; no atrophy noted Sensory: Pinprick and light touch decreased in the left upper and lower extremities Deep Tendon Reflexes: Symmetric throughout Plantars: Right: mute   Left: mute Cerebellar: Normal finger-to-nose and normal heel-to-shin testing bilaterally Gait: not  tested due to safety concerns   Laboratory Studies:  Basic Metabolic Panel: Recent Labs  Lab 03/18/20 1212 03/19/20 0432  NA 140 139  K 3.0* 3.0*  CL 105 106  CO2 22 21*  GLUCOSE 229* 171*  BUN 36* 41*  CREATININE 2.33* 2.42*  CALCIUM 8.8* 8.9  MG  --  1.8    Liver Function Tests: No results for input(s): AST, ALT, ALKPHOS, BILITOT, PROT, ALBUMIN in the last 168 hours. No results for input(s): LIPASE, AMYLASE in the last 168 hours. No results for input(s): AMMONIA in the last 168 hours.  CBC: Recent Labs  Lab 03/18/20 1212 03/19/20 0432  WBC 13.0* 14.7*  NEUTROABS 10.0*  --   HGB 9.7* 9.6*  HCT 28.3* 28.6*  MCV 85.5 86.9  PLT 322 340    Cardiac Enzymes: No results for input(s): CKTOTAL, CKMB, CKMBINDEX, TROPONINI in the last 168 hours.  BNP: Invalid input(s): POCBNP  CBG: Recent Labs  Lab 03/18/20 2220 03/19/20 0348 03/19/20 0718 03/19/20 1131 03/19/20 1618  GLUCAP 185* 135* 155* 238* 135*    Microbiology: Results for orders placed or performed during the hospital encounter of 03/18/20  Urine culture     Status: Abnormal (Preliminary result)   Collection Time: 03/18/20 12:12 PM   Specimen: Urine, Random  Result Value Ref Range Status   Specimen Description   Final    URINE, RANDOM Performed at [facility], [address], [place], [place] [address]    Special Requests   Final    NONE Performed at [facility], [address] [place], [place] [address]    Culture (A)  Final    >=100,000 COLONIES/mL ENTEROCOCCUS FAECALIS SUSCEPTIBILITIES TO FOLLOW Performed at [facility], [place] [address], [place], [place] [address]    Report Status PENDING  Incomplete  Respiratory Panel by RT PCR (Flu A&B, Covid) - Nasopharyngeal Swab     Status: None   Collection Time: 03/18/20  2:23 PM   Specimen: Nasopharyngeal Swab  Result Value Ref Range Status   SARS Coronavirus 2 by RT PCR NEGATIVE NEGATIVE Final    Comment:  (NOTE) SARS-CoV-2 target nucleic acids are NOT DETECTED. The SARS-CoV-2 RNA is generally detectable in upper respiratoy specimens during the acute phase of infection. The lowest concentration of SARS-CoV-2 viral copies this assay can detect is 131 copies/mL. A negative result does not preclude SARS-Cov-2 infection and should not be used as the sole basis for treatment or other patient management decisions. A negative result  may occur with  improper specimen collection/handling, submission of specimen other than nasopharyngeal swab, presence of viral mutation(s) within the areas targeted by this assay, and inadequate number of viral copies (<131 copies/mL). A negative result must be combined with clinical observations, patient history, and epidemiological information. The expected result is Negative. Fact Sheet for Patients:  PinkCheek.be Fact Sheet for Healthcare Providers:  GravelBags.it This test is not yet ap proved or cleared by the [place] FDA and  has been authorized for detection and/or diagnosis of SARS-CoV-2 by FDA under an Emergency Use Authorization (EUA). This EUA will remain  in effect (meaning this test can be used) for the duration of the COVID-19 declaration under Section 564(b)(1) of the Act, 21 U.S.C. section 360bbb-3(b)(1), unless the authorization is terminated or revoked sooner.    Influenza A by PCR NEGATIVE NEGATIVE Final   Influenza B by PCR NEGATIVE NEGATIVE Final    Comment: (NOTE) The Xpert Xpress SARS-CoV-2/FLU/RSV assay is intended as an aid in  the diagnosis of influenza from Nasopharyngeal swab specimens and  should not be used as a sole basis for treatment. Nasal washings and  aspirates are unacceptable for Xpert Xpress SARS-CoV-2/FLU/RSV  testing. Fact Sheet for Patients: PinkCheek.be Fact Sheet for Healthcare  Providers: GravelBags.it This test is not yet approved or cleared by the [place] FDA and  has been authorized for detection and/or diagnosis of SARS-CoV-2 by  FDA under an Emergency Use Authorization (EUA). This EUA will remain  in effect (meaning this test can be used) for the duration of the  Covid-19 declaration under Section 564(b)(1) of the Act, 21  U.S.C. section 360bbb-3(b)(1), unless the authorization is  terminated or revoked. Performed at [facility], [address] [place],  [address]     Coagulation Studies: Recent Labs    03/18/20 1349  LABPROT 15.5*  INR 1.2    Urinalysis:  Recent Labs  Lab 03/18/20 1212  COLORURINE YELLOW*  LABSPEC 1.015  PHURINE 5.0  GLUCOSEU >=500*  HGBUR MODERATE*  BILIRUBINUR NEGATIVE  KETONESUR NEGATIVE  PROTEINUR >=300*  NITRITE NEGATIVE  LEUKOCYTESUR NEGATIVE    Lipid Panel:    Component Value Date/Time   CHOL 116 03/19/2020 0432   TRIG 149 03/19/2020 0432   HDL 40 (L) 03/19/2020 0432   CHOLHDL 2.9 03/19/2020 0432   VLDL 30 03/19/2020 0432   LDLCALC 46 03/19/2020 0432    HgbA1C:  Lab Results  Component Value Date   HGBA1C 7.7 (H) 03/18/2020    Urine Drug Screen:  No results found for: LABOPIA, COCAINSCRNUR, LABBENZ, AMPHETMU, THCU, LABBARB  Alcohol Level: No results for input(s): ETH in the last 168 hours.  Other results: EKG: sinus tachycardia at 112 bpm  Imaging: DG Chest 2 View  Result Date: 03/18/2020 CLINICAL DATA:  Shortness of breath, LEFT side numbness since Friday, has UTI, history coronary artery disease post MI and bypass, hypertension, diabetes mellitus EXAM: CHEST - 2 VIEW COMPARISON:  03/18/2018 FINDINGS: Enlargement of cardiac silhouette post CABG. Pulmonary vascular congestion. Atherosclerotic calcification aorta. Interstitial infiltrates in both lungs new since previous exam favoring pulmonary edema and CHF. Small LEFT pleural effusion. No  pneumothorax or acute osseous findings. IMPRESSION: Enlargement of cardiac silhouette post CABG with pulmonary vascular congestion and pulmonary edema consistent with CHF. Small LEFT pleural effusion. Electronically Signed   By: Lavonia Dana M.D.   On: 03/18/2020 12:52   CT Head Wo Contrast  Result Date: 03/18/2020 CLINICAL DATA:  Left-sided numbness. EXAM: CT HEAD WITHOUT  CONTRAST TECHNIQUE: Contiguous axial images were obtained from the base of the skull through the vertex without intravenous contrast. COMPARISON:  07/21/2019 FINDINGS: Brain: No evidence of acute infarction, hemorrhage, hydrocephalus, extra-axial collection or mass lesion/mass effect. Old right posterior parietal infarct with secondary encephalomalacia. Tiny old infarcts in the pons and left thalamus. Vascular: No hyperdense vessel or unexpected calcification. Skull: Normal. Negative for fracture or focal lesion. Sinuses/Orbits: Normal. Other: None IMPRESSION: No acute abnormality. Old infarcts. Electronically Signed   By: Lorriane Shire M.D.   On: 03/18/2020 13:38   MR BRAIN WO CONTRAST  Result Date: 03/18/2020 CLINICAL DATA:  Old infarcts. Left-sided numbness. EXAM: MRI HEAD WITHOUT CONTRAST TECHNIQUE: Multiplanar, multiecho pulse sequences of the brain and surrounding structures were obtained without intravenous contrast. COMPARISON:  Brain MRI 07/21/2019 FINDINGS: BRAIN: Small focus of acute ischemia within the dorsal right pons. No other diffusion abnormality. No acute hemorrhage. There are multiple old infarcts of the deep gray nuclei and cerebellum. There is an old posterior right MCA territory infarct. Multifocal white matter hyperintensity, most commonly due to chronic ischemic microangiopathy. Normal volume of brain parenchyma and CSF spaces. Midline structures are normal. VASCULAR: Major flow voids are preserved. Susceptibility-sensitive sequences show no chronic microhemorrhage or superficial siderosis. SKULL AND UPPER CERVICAL  SPINE: Normal calvarium and skull base. Visualized upper cervical spine and soft tissues are normal. SINUSES/ORBITS: No fluid levels or advanced mucosal thickening. No mastoid or middle ear effusion. Normal orbits. IMPRESSION: 1. Small focus of acute ischemia within the dorsal right pons. No hemorrhage or mass effect. 2. Multiple old infarcts and findings of chronic small vessel ischemia. Electronically Signed   By: Ulyses Jarred M.D.   On: 03/18/2020 22:09   ECHOCARDIOGRAM COMPLETE  Result Date: 03/19/2020    ECHOCARDIOGRAM REPORT   Patient Name:   Sharon Arias Date of Exam: 03/18/2020 Medical Rec #:  [id number]      Height:       68.0 in Accession #:    [id number]     Weight:       255.8 lb Date of Birth:  Jul 28, 1961       BSA:          2.269 m Patient Age:    50 years       BP:           176/73 mmHg Patient Gender: F              HR:           68 bpm. Exam Location:  [facility] Procedure: 2D Echo, Cardiac Doppler and Color Doppler Indications:     Q65.78 Acute Diastolic CHF  History:         Patient has prior history of Echocardiogram examinations, most                  recent 07/22/2019. Risk Factors:Diabetes and Morbid Obesity.                  Myocardial Infarction. CVA. Chronic kidney disease. Coronary                  artery disease.  Sonographer:     Wilford Sports Rodgers-Jones Referring Phys:  Unknown Foley NIU Diagnosing Phys: Nelva Bush MD IMPRESSIONS  1. Left ventricular ejection fraction, by estimation, is 55 to 60%. The left ventricle has normal function. The left ventricle has no regional wall motion abnormalities. Left ventricular diastolic parameters are consistent with Grade II diastolic dysfunction (pseudonormalization). Elevated  left atrial pressure.  2. Right ventricular systolic function is mildly reduced. The right ventricular size is normal.  3. Left atrial size was mildly dilated.  4. There is irregular thickening of the interatrial septum, incompletely characterized on this study.  5. The mitral  valve is degenerative. Trivial mitral valve regurgitation. No evidence of mitral stenosis.  6. The aortic valve was not well visualized. Aortic valve regurgitation is not visualized. No aortic stenosis is present.  7. The inferior vena cava is normal in size with greater than 50% respiratory variability, suggesting right atrial pressure of 3 mmHg. FINDINGS  Left Ventricle: Left ventricular ejection fraction, by estimation, is 55 to 60%. The left ventricle has normal function. The left ventricle has no regional wall motion abnormalities. The left ventricular internal cavity size was normal in size. There is  no left ventricular hypertrophy. Left ventricular diastolic parameters are consistent with Grade II diastolic dysfunction (pseudonormalization). Elevated left atrial pressure. Right Ventricle: The right ventricular size is normal. Right vetricular wall thickness was not assessed. Right ventricular systolic function is mildly reduced. Left Atrium: Left atrial size was mildly dilated. Right Atrium: Right atrial size was normal in size. Pericardium: There is no evidence of pericardial effusion. Mitral Valve: The mitral valve is degenerative in appearance. There is mild thickening of the mitral valve leaflet(s). There is mild calcification of the mitral valve leaflet(s). Mild mitral annular calcification. Trivial mitral valve regurgitation. No evidence of mitral valve stenosis. Tricuspid Valve: The tricuspid valve is not well visualized. Tricuspid valve regurgitation is trivial. Aortic Valve: The aortic valve was not well visualized. Aortic valve regurgitation is not visualized. No aortic stenosis is present. Pulmonic Valve: The pulmonic valve was not well visualized. Pulmonic valve regurgitation is trivial. No evidence of pulmonic stenosis. Aorta: The aortic root is normal in size and structure. Pulmonary Artery: The pulmonary artery is not well seen. Venous: The inferior vena cava is normal in size with greater  than 50% respiratory variability, suggesting right atrial pressure of 3 mmHg. IAS/Shunts: The interatrial septum was not well visualized.  LEFT VENTRICLE PLAX 2D LVIDd:         4.06 cm Diastology LVIDs:         2.74 cm LV e' lateral:   7.89 cm/s LV PW:         0.96 cm LV E/e' lateral: 15.3 LV IVS:        0.92 cm LV e' medial:    5.34 cm/s                        LV E/e' medial:  22.7  RIGHT VENTRICLE RV Basal diam:  3.77 cm RV S prime:     10.58 cm/s TAPSE (M-mode): 1.8 cm LEFT ATRIUM             Index       RIGHT ATRIUM           Index LA diam:        4.80 cm 2.12 cm/m  RA Area:     13.50 cm LA Vol (A2C):   68.2 ml 30.06 ml/m RA Volume:   32.80 ml  14.46 ml/m LA Vol (A4C):   64.9 ml 28.60 ml/m LA Biplane Vol: 69.4 ml 30.59 ml/m   AORTA Ao Root diam: 3.10 cm MITRAL VALVE MV Area (PHT): 3.48 cm MV Decel Time: 218 msec MV E velocity: 121.00 cm/s MV A velocity: 88.10 cm/s MV E/A ratio:  1.37 Harrell Gave  End MD Electronically signed by Nelva Bush MD Signature Date/Time: 03/19/2020/7:00:37 AM    Final     Assessment: 59 y.o. female with a medical history significant for hypertension, hyperlipidemia, diabetes mellitus, stroke, CKD-4, anemia, who presents with shortness breath and left sided numbness.  Patient on ASA. Plavix and statin as an outpatient.  MRI of the brain personally reviewed and shows an acute right dorsal midbrain infarct.  Etiology likely small vessel disease.  Recent carotid dopplers were unremarkable.  Echocardiogram shows no cardiac source of emboli with an EF of 55-60%.  A1c 7.7, LDL 46.   Stroke Risk Factors - diabetes mellitus, hyperlipidemia and hypertension  Plan: 1. Blood sugar management with target A1c<7.0 2. MRA of the brain without contrast to evaluate the posterior circulation 3. PT consult, OT consult, Speech consult 4. Continue statin 5. Prophylactic therapy-Continue ASA and Plavix 6. Telemetry monitoring 7. Frequent neuro checks 8. BP control with target  BP<140/80   Alexis Goodell, MD Neurology [phone] 03/19/2020, 6:38 PM

## 2020-03-19 NOTE — Progress Notes (Signed)
ANTICOAGULATION CONSULT NOTE  Pharmacy Consult for heparin Indication: NSTEMI  No Known Allergies  Patient Measurements: Height: 5\' 8"  (172.7 cm) Weight: 255 lb 12.8 oz (116 kg) IBW/kg (Calculated) : 63.9 Heparin Dosing Weight: 90 kg  Vital Signs: Temp: 99.1 F (37.3 C) (03/31 0350) Temp Source: Oral (03/31 0350) BP: 169/70 (03/31 0350) Pulse Rate: 66 (03/31 0350)  Labs: Recent Labs    03/18/20 1212 03/18/20 1212 03/18/20 1349 03/18/20 1447 03/18/20 1850 03/18/20 2032 03/19/20 0432  HGB 9.7*  --   --   --   --   --  9.6*  HCT 28.3*  --   --   --   --   --  28.6*  PLT 322  --   --   --   --   --  340  APTT  --   --  44*  --   --   --   --   LABPROT  --   --  15.5*  --   --   --   --   INR  --   --  1.2  --   --   --   --   HEPARINUNFRC  --   --   --   --   --  <0.10* 0.11*  CREATININE 2.33*  --   --   --   --   --   --   TROPONINIHS 2,275*   < >  --  2,884* 2,355* 2,094*  --    < > = values in this interval not displayed.    Estimated Creatinine Clearance: 35.2 mL/min (A) (by C-G formula based on SCr of 2.33 mg/dL (H)).   Medical History: Past Medical History:  Diagnosis Date  . Anemia in chronic kidney disease 05/19/2016  . CAD (coronary artery disease)   . Chicken pox   . Chronic kidney disease    per dr sparks  . CVA (cerebral vascular accident) ([place])   . Detached retina   . Diabetes mellitus without complication ([place])    type 2  . Diabetic neuropathy ([place])   . Diabetic retinopathy ([place])    legally blind  . Hyperlipidemia   . Hypertension   . Myocardial infarction ([place])   . Obesity   . PONV (postoperative nausea and vomiting)   . Sciatica of right side    going to see physiciatry    Assessment: 59 year old female presented with SOB and left-sided numbness. Initial troponin 2275 with trend up to 2884. Patient is not on anticoagulation PTA. Pharmacy consulted for heparin drip for NSTEMI.  Goal of Therapy:  Heparin level 0.3-0.7 units/ml Monitor  platelets by anticoagulation protocol: Yes   Plan:  Heparin 4000 unit bolus followed by heparin drip at 1100 units/hr. Check HL at 2100. CBC daily.  3/30: HL @ 2032 = < 0.1 Will order Heparin 2700 units IV X 1 and increase drip rate to 1400 units/hr.  Will recheck HL 6 hrs after rate change.   0331 0432 HL 0.11, SUBtherapeutic, CBC stable.  Will rebolus with 2500 units x 1 and increase heparin drip to 1650 units/hr and recheck HL ~ 6 hours after rate increase  Ena Dawley, PharmD 03/19/2020,5:30 AM

## 2020-03-20 ENCOUNTER — Inpatient Hospital Stay: Payer: Medicare Other

## 2020-03-20 DIAGNOSIS — I639 Cerebral infarction, unspecified: Secondary | ICD-10-CM

## 2020-03-20 DIAGNOSIS — E876 Hypokalemia: Secondary | ICD-10-CM

## 2020-03-20 LAB — CBC WITH DIFFERENTIAL/PLATELET
Abs Immature Granulocytes: 0.06 10*3/uL (ref 0.00–0.07)
Basophils Absolute: 0.1 10*3/uL (ref 0.0–0.1)
Basophils Relative: 1 %
Eosinophils Absolute: 0.4 10*3/uL (ref 0.0–0.5)
Eosinophils Relative: 3 %
HCT: 24.7 % — ABNORMAL LOW (ref 36.0–46.0)
Hemoglobin: 8.2 g/dL — ABNORMAL LOW (ref 12.0–15.0)
Immature Granulocytes: 1 %
Lymphocytes Relative: 23 %
Lymphs Abs: 2.7 10*3/uL (ref 0.7–4.0)
MCH: 28.5 pg (ref 26.0–34.0)
MCHC: 33.2 g/dL (ref 30.0–36.0)
MCV: 85.8 fL (ref 80.0–100.0)
Monocytes Absolute: 1 10*3/uL (ref 0.1–1.0)
Monocytes Relative: 9 %
Neutro Abs: 7.4 10*3/uL (ref 1.7–7.7)
Neutrophils Relative %: 63 %
Platelets: 317 10*3/uL (ref 150–400)
RBC: 2.88 MIL/uL — ABNORMAL LOW (ref 3.87–5.11)
RDW: 14.9 % (ref 11.5–15.5)
WBC: 11.6 10*3/uL — ABNORMAL HIGH (ref 4.0–10.5)
nRBC: 0 % (ref 0.0–0.2)

## 2020-03-20 LAB — BASIC METABOLIC PANEL
Anion gap: 9 (ref 5–15)
BUN: 54 mg/dL — ABNORMAL HIGH (ref 6–20)
CO2: 22 mmol/L (ref 22–32)
Calcium: 8.6 mg/dL — ABNORMAL LOW (ref 8.9–10.3)
Chloride: 106 mmol/L (ref 98–111)
Creatinine, Ser: 2.81 mg/dL — ABNORMAL HIGH (ref 0.44–1.00)
GFR calc Af Amer: 21 mL/min — ABNORMAL LOW (ref >60)
GFR calc non Af Amer: 18 mL/min — ABNORMAL LOW (ref >60)
Glucose, Bld: 197 mg/dL — ABNORMAL HIGH (ref 70–99)
Potassium: 3.4 mmol/L — ABNORMAL LOW (ref 3.5–5.1)
Sodium: 137 mmol/L (ref 135–145)

## 2020-03-20 LAB — MAGNESIUM: Magnesium: 1.8 mg/dL (ref 1.7–2.4)

## 2020-03-20 LAB — URINE CULTURE: Culture: >=100000 — AB

## 2020-03-20 LAB — GLUCOSE, CAPILLARY
Glucose-Capillary: 110 mg/dL — ABNORMAL HIGH (ref 70–99)
Glucose-Capillary: 140 mg/dL — ABNORMAL HIGH (ref 70–99)
Glucose-Capillary: 145 mg/dL — ABNORMAL HIGH (ref 70–99)
Glucose-Capillary: 160 mg/dL — ABNORMAL HIGH (ref 70–99)
Glucose-Capillary: 167 mg/dL — ABNORMAL HIGH (ref 70–99)
Glucose-Capillary: 232 mg/dL — ABNORMAL HIGH (ref 70–99)
Glucose-Capillary: 59 mg/dL — ABNORMAL LOW (ref 70–99)

## 2020-03-20 LAB — HEPARIN LEVEL (UNFRACTIONATED): Heparin Unfractionated: 0.25 IU/mL — ABNORMAL LOW (ref 0.30–0.70)

## 2020-03-20 MED ORDER — AMOXICILLIN 500 MG PO CAPS
500.0000 mg | ORAL_CAPSULE | Freq: Two times a day (BID) | ORAL | Status: DC
  Filled 2020-03-20: qty 1

## 2020-03-20 MED ORDER — ENOXAPARIN SODIUM 30 MG/0.3ML ~~LOC~~ SOLN
30.0000 mg | SUBCUTANEOUS | Status: DC
  Administered 2020-03-20 – 2020-03-21 (×2): 30 mg via SUBCUTANEOUS
  Filled 2020-03-20 (×2): qty 0.3

## 2020-03-20 MED ORDER — ALLOPURINOL 100 MG PO TABS
200.0000 mg | ORAL_TABLET | Freq: Every day | ORAL | Status: DC
  Administered 2020-03-21 – 2020-03-22 (×2): 200 mg via ORAL
  Filled 2020-03-20 (×2): qty 2

## 2020-03-20 MED ORDER — POTASSIUM CHLORIDE CRYS ER 20 MEQ PO TBCR
40.0000 meq | EXTENDED_RELEASE_TABLET | Freq: Once | ORAL | Status: AC
  Administered 2020-03-20: 12:00:00 40 meq via ORAL
  Filled 2020-03-20: qty 2

## 2020-03-20 MED ORDER — SODIUM CHLORIDE 0.9% FLUSH
3.0000 mL | Freq: Two times a day (BID) | INTRAVENOUS | Status: DC
  Administered 2020-03-20 – 2020-03-21 (×3): 3 mL via INTRAVENOUS

## 2020-03-20 NOTE — Progress Notes (Addendum)
Progress Note    Sharon Arias  ZOX:[id number] DOB: May 03, 1961  DOA: 03/18/2020 PCP: Idelle Crouch, MD      Brief Narrative:    Medical records reviewed and are as summarized below:  Sharon Arias is an 59 y.o. female with medical history significant of hypertension, hyperlipidemia, diabetes mellitus, stroke, history of stroke, CKD-4, anemia, who presented with shortness breath.  Patient said that she had been having shortness of breath for more than 4 days, which has been progressively worsening she also complained of left-sided numbness, and tingling in left hand.      Assessment/Plan:   Principal Problem:   Acute CHF (congestive heart failure) (HCC) Active Problems:   Anemia in chronic kidney disease   CKD (chronic kidney disease), stage IV (HCC)   Coronary artery disease   Hyperlipidemia, unspecified   HTN (hypertension)   Hypokalemia   Right pontine stroke (HCC)   Gout   Leukocytosis   Left sided numbness   Acute on chronic diastolic CHF (congestive heart failure) ([place]):  Hold Lasix.  2D echo showed EF estimated at 55 to 60% and grade 2 diastolic dysfunction -Monitor daily weight, intake and output and BMP.  Low-salt diet.  Fluid restriction Monitor REDs Vest reading  Anemia in chronic kidney disease:  H&H stable  AKI on CKD (chronic kidney disease), stage IV ([place]):  Creatinine is much worse today.  Hold losartan and IV Lasix,  and monitor BMP. Baseline creatinine 1.7-2.0.   Hx of Coronary artery disease s/p CABG /elevated troponin probably from demand ischemia: Patient was seen by the cardiologist who does not think she has NSTEMI.  He recommended IV heparin infusion be discontinued.  Continue aspirin and statins  Hyperlipidemia, unspecified Discontinue fenofibrate because of low creatinine clearance/GFR  HTN:  -Continue home medications: Amlodipine, Cozaar, metoprolol -hydralazine prn  Hypokalemia:  Improving.  Replete  potassium  Hx of Stroke/new acute right pontine infarct with left sided numbness -continue aspirin, Plavix, statin MRA of the head showed 70% stenosis in the right anterior genu ICA.  Consult vascular surgeon for further evaluation.  Gout -Decrease allopurinol from 500 to 200 mg daily because of CKD stage IV  Leukocytosis:  Improving.  This is likely reactive.    Asymptomatic bacteriuria Urine culture showed Enterococcus faecalis.  However, patient said she has no urinary symptoms.  She said she has increased frequency of micturition a few days prior to admission when she first started taking Lasix at home.  No antibiotics for now.  She has been advised to watch out for any urinary symptoms or fever that might develop and notify PCP accordingly.  Body mass index is 38.5 kg/m.  (Morbid obesity)   Family Communication/Anticipated D/C date and plan/Code Status   DVT prophylaxis: IV heparin infusion Code Status: Full code Family Communication: Plan discussed with patient Disposition Plan: Patient is from home.  Plan to discharge her home tomorrow     Subjective:   She feels better today.  No shortness of breath or chest pain.  She still has some numbness on her left side.  Objective:    Vitals:   03/19/20 1931 03/20/20 0359 03/20/20 0743 03/20/20 1137  BP: (!) 148/56 (!) 127/47 (!) 156/60 (!) 145/61  Pulse: 64 (!) 56 61 (!) 58  Resp:   16 18  Temp: 98.3 F (36.8 C) 98.3 F (36.8 C) 98 F (36.7 C) 98 F (36.7 C)  TempSrc: Oral Oral Oral Oral  SpO2: 97% 97%  97% 100%  Weight:  114.9 kg    Height:        Intake/Output Summary (Last 24 hours) at 03/20/2020 1613 Last data filed at 03/20/2020 1437 Gross per 24 hour  Intake 968.53 ml  Output 2300 ml  Net -1331.47 ml   Filed Weights   03/18/20 1204 03/18/20 1819 03/20/20 0359  Weight: 115.7 kg 116 kg 114.9 kg    Exam:  GEN: NAD SKIN: No rash EYES: EOMI ENT: MMM CV: RRR PULM: CTA B ABD: soft, ND, NT, +BS CNS:  AAO x 3, non focal EXT: improving b/l leg edema (1+ ), no tenderness   Data Reviewed:   I have personally reviewed following labs and imaging studies:  Labs: Labs show the following:   Basic Metabolic Panel: Recent Labs  Lab 03/18/20 1212 03/18/20 1212 03/19/20 0432 03/20/20 0304  NA 140  --  139 137  K 3.0*   < > 3.0* 3.4*  CL 105  --  106 106  CO2 22  --  21* 22  GLUCOSE 229*  --  171* 197*  BUN 36*  --  41* 54*  CREATININE 2.33*  --  2.42* 2.81*  CALCIUM 8.8*  --  8.9 8.6*  MG  --   --  1.8 1.8   < > = values in this interval not displayed.   GFR Estimated Creatinine Clearance: 29 mL/min (A) (by C-G formula based on SCr of 2.81 mg/dL (H)). Liver Function Tests: No results for input(s): AST, ALT, ALKPHOS, BILITOT, PROT, ALBUMIN in the last 168 hours. No results for input(s): LIPASE, AMYLASE in the last 168 hours. No results for input(s): AMMONIA in the last 168 hours. Coagulation profile Recent Labs  Lab 03/18/20 1349  INR 1.2    CBC: Recent Labs  Lab 03/18/20 1212 03/19/20 0432 03/20/20 0304  WBC 13.0* 14.7* 11.6*  NEUTROABS 10.0*  --  7.4  HGB 9.7* 9.6* 8.2*  HCT 28.3* 28.6* 24.7*  MCV 85.5 86.9 85.8  PLT 322 340 317   Cardiac Enzymes: No results for input(s): CKTOTAL, CKMB, CKMBINDEX, TROPONINI in the last 168 hours. BNP (last 3 results) No results for input(s): PROBNP in the last 8760 hours. CBG: Recent Labs  Lab 03/19/20 2005 03/19/20 2304 03/20/20 0400 03/20/20 0752 03/20/20 1135  GLUCAP 205* 128* 167* 160* 145*   D-Dimer: No results for input(s): DDIMER in the last 72 hours. Hgb A1c: Recent Labs    03/18/20 1447  HGBA1C 7.7*   Lipid Profile: Recent Labs    03/19/20 0432  CHOL 116  HDL 40*  LDLCALC 46  TRIG 149  CHOLHDL 2.9   Thyroid function studies: No results for input(s): TSH, T4TOTAL, T3FREE, THYROIDAB in the last 72 hours.  Invalid input(s): FREET3 Anemia work up: No results for input(s): VITAMINB12, FOLATE,  FERRITIN, TIBC, IRON, RETICCTPCT in the last 72 hours. Sepsis Labs: Recent Labs  Lab 03/18/20 1212 03/19/20 0432 03/20/20 0304  WBC 13.0* 14.7* 11.6*    Microbiology Recent Results (from the past 240 hour(s))  Urine culture     Status: Abnormal   Collection Time: 03/18/20 12:12 PM   Specimen: Urine, Random  Result Value Ref Range Status   Specimen Description   Final    URINE, RANDOM Performed at [facility], [address] [place], [place] [address]    Special Requests   Final    NONE Performed at [facility], [address], [place], [place] [address]    Culture >=100,000  COLONIES/mL ENTEROCOCCUS FAECALIS (A)  Final   Report Status 03/20/2020 FINAL  Final   Organism ID, Bacteria ENTEROCOCCUS FAECALIS (A)  Final      Susceptibility   Enterococcus faecalis - MIC*    AMPICILLIN <=2 SENSITIVE Sensitive     NITROFURANTOIN <=16 SENSITIVE Sensitive     VANCOMYCIN 1 SENSITIVE Sensitive     * >=100,000 COLONIES/mL ENTEROCOCCUS FAECALIS  Respiratory Panel by RT PCR (Flu A&B, Covid) - Nasopharyngeal Swab     Status: None   Collection Time: 03/18/20  2:23 PM   Specimen: Nasopharyngeal Swab  Result Value Ref Range Status   SARS Coronavirus 2 by RT PCR NEGATIVE NEGATIVE Final    Comment: (NOTE) SARS-CoV-2 target nucleic acids are NOT DETECTED. The SARS-CoV-2 RNA is generally detectable in upper respiratoy specimens during the acute phase of infection. The lowest concentration of SARS-CoV-2 viral copies this assay can detect is 131 copies/mL. A negative result does not preclude SARS-Cov-2 infection and should not be used as the sole basis for treatment or other patient management decisions. A negative result may occur with  improper specimen collection/handling, submission of specimen other than nasopharyngeal swab, presence of viral mutation(s) within the areas targeted by this assay, and inadequate number of viral copies (<131 copies/mL). A negative result  must be combined with clinical observations, patient history, and epidemiological information. The expected result is Negative. Fact Sheet for Patients:  PinkCheek.be Fact Sheet for Healthcare Providers:  GravelBags.it This test is not yet ap proved or cleared by the [place] FDA and  has been authorized for detection and/or diagnosis of SARS-CoV-2 by FDA under an Emergency Use Authorization (EUA). This EUA will remain  in effect (meaning this test can be used) for the duration of the COVID-19 declaration under Section 564(b)(1) of the Act, 21 U.S.C. section 360bbb-3(b)(1), unless the authorization is terminated or revoked sooner.    Influenza A by PCR NEGATIVE NEGATIVE Final   Influenza B by PCR NEGATIVE NEGATIVE Final    Comment: (NOTE) The Xpert Xpress SARS-CoV-2/FLU/RSV assay is intended as an aid in  the diagnosis of influenza from Nasopharyngeal swab specimens and  should not be used as a sole basis for treatment. Nasal washings and  aspirates are unacceptable for Xpert Xpress SARS-CoV-2/FLU/RSV  testing. Fact Sheet for Patients: PinkCheek.be Fact Sheet for Healthcare Providers: GravelBags.it This test is not yet approved or cleared by the [place] FDA and  has been authorized for detection and/or diagnosis of SARS-CoV-2 by  FDA under an Emergency Use Authorization (EUA). This EUA will remain  in effect (meaning this test can be used) for the duration of the  Covid-19 declaration under Section 564(b)(1) of the Act, 21  U.S.C. section 360bbb-3(b)(1), unless the authorization is  terminated or revoked. Performed at [facility], [address] [place], [place] [address]     Procedures and diagnostic studies:  MR ANGIO HEAD WO CONTRAST  Result Date: 03/20/2020 CLINICAL DATA:  Stroke follow-up EXAM: MRA HEAD WITHOUT CONTRAST TECHNIQUE:  Angiographic images of the Circle of Willis were obtained using MRA technique without intravenous contrast. COMPARISON:  Brain MRI from 2 days ago FINDINGS: The carotid and vertebral arteries are symmetric and patent. The basilar is smooth and widely patent. There is cavernous sinus atheromatous irregularity with 70% narrowing at the right anterior genu. Extensive atherosclerotic irregularity of the posterior cerebral arteries with high-grade narrowing just beyond the right PCA bifurcation. No major branch occlusion. Negative for aneurysm. IMPRESSION: 1. Generalized intracranial atherosclerotic  disease. 2. ~70% narrowing at the right anterior genu ICA. 3. Prominent atherosclerotic irregularity of the PCAs. 4. There is acute pontine stroke by prior MRI. The basilar is smooth and widely patent. Electronically Signed   By: Monte Fantasia M.D.   On: 03/20/2020 11:18   MR BRAIN WO CONTRAST  Result Date: 03/18/2020 CLINICAL DATA:  Old infarcts. Left-sided numbness. EXAM: MRI HEAD WITHOUT CONTRAST TECHNIQUE: Multiplanar, multiecho pulse sequences of the brain and surrounding structures were obtained without intravenous contrast. COMPARISON:  Brain MRI 07/21/2019 FINDINGS: BRAIN: Small focus of acute ischemia within the dorsal right pons. No other diffusion abnormality. No acute hemorrhage. There are multiple old infarcts of the deep gray nuclei and cerebellum. There is an old posterior right MCA territory infarct. Multifocal white matter hyperintensity, most commonly due to chronic ischemic microangiopathy. Normal volume of brain parenchyma and CSF spaces. Midline structures are normal. VASCULAR: Major flow voids are preserved. Susceptibility-sensitive sequences show no chronic microhemorrhage or superficial siderosis. SKULL AND UPPER CERVICAL SPINE: Normal calvarium and skull base. Visualized upper cervical spine and soft tissues are normal. SINUSES/ORBITS: No fluid levels or advanced mucosal thickening. No mastoid  or middle ear effusion. Normal orbits. IMPRESSION: 1. Small focus of acute ischemia within the dorsal right pons. No hemorrhage or mass effect. 2. Multiple old infarcts and findings of chronic small vessel ischemia. Electronically Signed   By: Ulyses Jarred M.D.   On: 03/18/2020 22:09   ECHOCARDIOGRAM COMPLETE  Result Date: 03/19/2020    ECHOCARDIOGRAM REPORT   Patient Name:   Sharon Arias Date of Exam: 03/18/2020 Medical Rec #:  [id number]      Height:       68.0 in Accession #:    [id number]     Weight:       255.8 lb Date of Birth:  [birth year]       BSA:          2.269 m Patient Age:    69 years       BP:           176/73 mmHg Patient Gender: F              HR:           68 bpm. Exam Location:  [facility] Procedure: 2D Echo, Cardiac Doppler and Color Doppler Indications:     W73.71 Acute Diastolic CHF  History:         Patient has prior history of Echocardiogram examinations, most                  recent 07/22/2019. Risk Factors:Diabetes and Morbid Obesity.                  Myocardial Infarction. CVA. Chronic kidney disease. Coronary                  artery disease.  Sonographer:     Wilford Sports Rodgers-Jones Referring Phys:  Unknown Foley NIU Diagnosing Phys: Nelva Bush MD IMPRESSIONS  1. Left ventricular ejection fraction, by estimation, is 55 to 60%. The left ventricle has normal function. The left ventricle has no regional wall motion abnormalities. Left ventricular diastolic parameters are consistent with Grade II diastolic dysfunction (pseudonormalization). Elevated left atrial pressure.  2. Right ventricular systolic function is mildly reduced. The right ventricular size is normal.  3. Left atrial size was mildly dilated.  4. There is irregular thickening of the interatrial septum, incompletely characterized on this study.  5. The mitral valve  is degenerative. Trivial mitral valve regurgitation. No evidence of mitral stenosis.  6. The aortic valve was not well visualized. Aortic valve regurgitation is not  visualized. No aortic stenosis is present.  7. The inferior vena cava is normal in size with greater than 50% respiratory variability, suggesting right atrial pressure of 3 mmHg. FINDINGS  Left Ventricle: Left ventricular ejection fraction, by estimation, is 55 to 60%. The left ventricle has normal function. The left ventricle has no regional wall motion abnormalities. The left ventricular internal cavity size was normal in size. There is  no left ventricular hypertrophy. Left ventricular diastolic parameters are consistent with Grade II diastolic dysfunction (pseudonormalization). Elevated left atrial pressure. Right Ventricle: The right ventricular size is normal. Right vetricular wall thickness was not assessed. Right ventricular systolic function is mildly reduced. Left Atrium: Left atrial size was mildly dilated. Right Atrium: Right atrial size was normal in size. Pericardium: There is no evidence of pericardial effusion. Mitral Valve: The mitral valve is degenerative in appearance. There is mild thickening of the mitral valve leaflet(s). There is mild calcification of the mitral valve leaflet(s). Mild mitral annular calcification. Trivial mitral valve regurgitation. No evidence of mitral valve stenosis. Tricuspid Valve: The tricuspid valve is not well visualized. Tricuspid valve regurgitation is trivial. Aortic Valve: The aortic valve was not well visualized. Aortic valve regurgitation is not visualized. No aortic stenosis is present. Pulmonic Valve: The pulmonic valve was not well visualized. Pulmonic valve regurgitation is trivial. No evidence of pulmonic stenosis. Aorta: The aortic root is normal in size and structure. Pulmonary Artery: The pulmonary artery is not well seen. Venous: The inferior vena cava is normal in size with greater than 50% respiratory variability, suggesting right atrial pressure of 3 mmHg. IAS/Shunts: The interatrial septum was not well visualized.  LEFT VENTRICLE PLAX 2D LVIDd:          4.06 cm Diastology LVIDs:         2.74 cm LV e' lateral:   7.89 cm/s LV PW:         0.96 cm LV E/e' lateral: 15.3 LV IVS:        0.92 cm LV e' medial:    5.34 cm/s                        LV E/e' medial:  22.7  RIGHT VENTRICLE RV Basal diam:  3.77 cm RV S prime:     10.58 cm/s TAPSE (M-mode): 1.8 cm LEFT ATRIUM             Index       RIGHT ATRIUM           Index LA diam:        4.80 cm 2.12 cm/m  RA Area:     13.50 cm LA Vol (A2C):   68.2 ml 30.06 ml/m RA Volume:   32.80 ml  14.46 ml/m LA Vol (A4C):   64.9 ml 28.60 ml/m LA Biplane Vol: 69.4 ml 30.59 ml/m   AORTA Ao Root diam: 3.10 cm MITRAL VALVE MV Area (PHT): 3.48 cm MV Decel Time: 218 msec MV E velocity: 121.00 cm/s MV A velocity: 88.10 cm/s MV E/A ratio:  1.37 Nelva Bush MD Electronically signed by Nelva Bush MD Signature Date/Time: 03/19/2020/7:00:37 AM    Final     Medications:   . [START ON 03/21/2020] allopurinol  200 mg Oral Daily  . amLODipine  10 mg Oral QHS  . aspirin EC  81 mg Oral BH-q7a  . cholecalciferol  400 Units Oral Daily  . clopidogrel  75 mg Oral Daily  . dextromethorphan-guaiFENesin  1 tablet Oral BID  . enoxaparin (LOVENOX) injection  30 mg Subcutaneous Q24H  . insulin aspart  0-9 Units Subcutaneous Q4H  . insulin aspart  10 Units Subcutaneous Q breakfast  . insulin aspart  16 Units Subcutaneous Q lunch  . insulin aspart  20 Units Subcutaneous Q supper  . insulin glargine  60 Units Subcutaneous QHS  . isosorbide mononitrate  30 mg Oral Daily  . magnesium oxide  400 mg Oral BID  . metoprolol tartrate  100 mg Oral BID  . rosuvastatin  40 mg Oral QHS   Continuous Infusions:    LOS: 2 days   Camryn Lampson  [facility]     03/20/2020, 4:13 PM

## 2020-03-20 NOTE — Progress Notes (Signed)
[facility]    SUBJECTIVE: Patient feels much better denies any significant worsening shortness of breath denies any chest pain is trying to increase activity and walk around more she is tolerating her medications well and feels like she has had significant improvement since she was admitted earlier   Vitals:   03/19/20 1931 03/20/20 0359 03/20/20 0743 03/20/20 1137  BP: (!) 148/56 (!) 127/47 (!) 156/60 (!) 145/61  Pulse: 64 (!) 56 61 (!) 58  Resp:   16 18  Temp: 98.3 F (36.8 C) 98.3 F (36.8 C) 98 F (36.7 C) 98 F (36.7 C)  TempSrc: Oral Oral Oral Oral  SpO2: 97% 97% 97% 100%  Weight:  114.9 kg    Height:         Intake/Output Summary (Last 24 hours) at 03/20/2020 1333 Last data filed at 03/20/2020 1020 Gross per 24 hour  Intake 728.53 ml  Output 2300 ml  Net -1571.47 ml      PHYSICAL EXAM  General: Well developed, well nourished, in no acute distress HEENT:  Normocephalic and atramatic Neck:  No JVD.  Lungs: Clear bilaterally to auscultation and percussion. Heart: HRRR . Normal S1 and S2 without gallops or murmurs.  Abdomen: Bowel sounds are positive, abdomen soft and non-tender  Msk:  Back normal, normal gait. Normal strength and tone for age. Extremities: No clubbing, cyanosis or edema.   Neuro: Alert and oriented X 3. Psych:  Good affect, responds appropriately   LABS: Basic Metabolic Panel: Recent Labs    03/19/20 0432 03/20/20 0304  NA 139 137  K 3.0* 3.4*  CL 106 106  CO2 21* 22  GLUCOSE 171* 197*  BUN 41* 54*  CREATININE 2.42* 2.81*  CALCIUM 8.9 8.6*  MG 1.8 1.8   Liver Function Tests: No results for input(s): AST, ALT, ALKPHOS, BILITOT, PROT, ALBUMIN in the last 72 hours. No results for input(s): LIPASE, AMYLASE in the last 72 hours. CBC: Recent Labs    03/18/20 1212 03/18/20 1212 03/19/20 0432 03/20/20 0304  WBC 13.0*   < > 14.7* 11.6*  NEUTROABS 10.0*  --   --  7.4  HGB 9.7*   < > 9.6* 8.2*  HCT 28.3*   < > 28.6* 24.7*  MCV  85.5   < > 86.9 85.8  PLT 322   < > 340 317   < > = values in this interval not displayed.   Cardiac Enzymes: No results for input(s): CKTOTAL, CKMB, CKMBINDEX, TROPONINI in the last 72 hours. BNP: Invalid input(s): POCBNP D-Dimer: No results for input(s): DDIMER in the last 72 hours. Hemoglobin A1C: Recent Labs    03/18/20 1447  HGBA1C 7.7*   Fasting Lipid Panel: Recent Labs    03/19/20 0432  CHOL 116  HDL 40*  LDLCALC 46  TRIG 149  CHOLHDL 2.9   Thyroid Function Tests: No results for input(s): TSH, T4TOTAL, T3FREE, THYROIDAB in the last 72 hours.  Invalid input(s): FREET3 Anemia Panel: No results for input(s): VITAMINB12, FOLATE, FERRITIN, TIBC, IRON, RETICCTPCT in the last 72 hours.  MR ANGIO HEAD WO CONTRAST  Result Date: 03/20/2020 CLINICAL DATA:  Stroke follow-up EXAM: MRA HEAD WITHOUT CONTRAST TECHNIQUE: Angiographic images of the Circle of Willis were obtained using MRA technique without intravenous contrast. COMPARISON:  Brain MRI from 2 days ago FINDINGS: The carotid and vertebral arteries are symmetric and patent. The basilar is smooth and widely patent. There is cavernous sinus atheromatous irregularity with 70% narrowing at the right anterior genu. Extensive  atherosclerotic irregularity of the posterior cerebral arteries with high-grade narrowing just beyond the right PCA bifurcation. No major branch occlusion. Negative for aneurysm. IMPRESSION: 1. Generalized intracranial atherosclerotic disease. 2. ~70% narrowing at the right anterior genu ICA. 3. Prominent atherosclerotic irregularity of the PCAs. 4. There is acute pontine stroke by prior MRI. The basilar is smooth and widely patent. Electronically Signed   By: Monte Fantasia M.D.   On: 03/20/2020 11:18   MR BRAIN WO CONTRAST  Result Date: 03/18/2020 CLINICAL DATA:  Old infarcts. Left-sided numbness. EXAM: MRI HEAD WITHOUT CONTRAST TECHNIQUE: Multiplanar, multiecho pulse sequences of the brain and surrounding  structures were obtained without intravenous contrast. COMPARISON:  Brain MRI 07/21/2019 FINDINGS: BRAIN: Small focus of acute ischemia within the dorsal right pons. No other diffusion abnormality. No acute hemorrhage. There are multiple old infarcts of the deep gray nuclei and cerebellum. There is an old posterior right MCA territory infarct. Multifocal white matter hyperintensity, most commonly due to chronic ischemic microangiopathy. Normal volume of brain parenchyma and CSF spaces. Midline structures are normal. VASCULAR: Major flow voids are preserved. Susceptibility-sensitive sequences show no chronic microhemorrhage or superficial siderosis. SKULL AND UPPER CERVICAL SPINE: Normal calvarium and skull base. Visualized upper cervical spine and soft tissues are normal. SINUSES/ORBITS: No fluid levels or advanced mucosal thickening. No mastoid or middle ear effusion. Normal orbits. IMPRESSION: 1. Small focus of acute ischemia within the dorsal right pons. No hemorrhage or mass effect. 2. Multiple old infarcts and findings of chronic small vessel ischemia. Electronically Signed   By: Ulyses Jarred M.D.   On: 03/18/2020 22:09   ECHOCARDIOGRAM COMPLETE  Result Date: 03/19/2020    ECHOCARDIOGRAM REPORT   Patient Name:   Sharon Arias Date of Exam: 03/18/2020 Medical Rec #:  [id number]      Height:       68.0 in Accession #:    [id number]     Weight:       255.8 lb Date of Birth:  03/03/1961       BSA:          2.269 m Patient Age:    59 years       BP:           176/73 mmHg Patient Gender: F              HR:           68 bpm. Exam Location:  [facility] Procedure: 2D Echo, Cardiac Doppler and Color Doppler Indications:     Z22.48 Acute Diastolic CHF  History:         Patient has prior history of Echocardiogram examinations, most                  recent 07/22/2019. Risk Factors:Diabetes and Morbid Obesity.                  Myocardial Infarction. CVA. Chronic kidney disease. Coronary                  artery disease.   Sonographer:     Wilford Sports Rodgers-Jones Referring Phys:  Unknown Foley NIU Diagnosing Phys: Nelva Bush MD IMPRESSIONS  1. Left ventricular ejection fraction, by estimation, is 55 to 60%. The left ventricle has normal function. The left ventricle has no regional wall motion abnormalities. Left ventricular diastolic parameters are consistent with Grade II diastolic dysfunction (pseudonormalization). Elevated left atrial pressure.  2. Right ventricular systolic function is mildly reduced. The right ventricular size is  normal.  3. Left atrial size was mildly dilated.  4. There is irregular thickening of the interatrial septum, incompletely characterized on this study.  5. The mitral valve is degenerative. Trivial mitral valve regurgitation. No evidence of mitral stenosis.  6. The aortic valve was not well visualized. Aortic valve regurgitation is not visualized. No aortic stenosis is present.  7. The inferior vena cava is normal in size with greater than 50% respiratory variability, suggesting right atrial pressure of 3 mmHg. FINDINGS  Left Ventricle: Left ventricular ejection fraction, by estimation, is 55 to 60%. The left ventricle has normal function. The left ventricle has no regional wall motion abnormalities. The left ventricular internal cavity size was normal in size. There is  no left ventricular hypertrophy. Left ventricular diastolic parameters are consistent with Grade II diastolic dysfunction (pseudonormalization). Elevated left atrial pressure. Right Ventricle: The right ventricular size is normal. Right vetricular wall thickness was not assessed. Right ventricular systolic function is mildly reduced. Left Atrium: Left atrial size was mildly dilated. Right Atrium: Right atrial size was normal in size. Pericardium: There is no evidence of pericardial effusion. Mitral Valve: The mitral valve is degenerative in appearance. There is mild thickening of the mitral valve leaflet(s). There is mild calcification  of the mitral valve leaflet(s). Mild mitral annular calcification. Trivial mitral valve regurgitation. No evidence of mitral valve stenosis. Tricuspid Valve: The tricuspid valve is not well visualized. Tricuspid valve regurgitation is trivial. Aortic Valve: The aortic valve was not well visualized. Aortic valve regurgitation is not visualized. No aortic stenosis is present. Pulmonic Valve: The pulmonic valve was not well visualized. Pulmonic valve regurgitation is trivial. No evidence of pulmonic stenosis. Aorta: The aortic root is normal in size and structure. Pulmonary Artery: The pulmonary artery is not well seen. Venous: The inferior vena cava is normal in size with greater than 50% respiratory variability, suggesting right atrial pressure of 3 mmHg. IAS/Shunts: The interatrial septum was not well visualized.  LEFT VENTRICLE PLAX 2D LVIDd:         4.06 cm Diastology LVIDs:         2.74 cm LV e' lateral:   7.89 cm/s LV PW:         0.96 cm LV E/e' lateral: 15.3 LV IVS:        0.92 cm LV e' medial:    5.34 cm/s                        LV E/e' medial:  22.7  RIGHT VENTRICLE RV Basal diam:  3.77 cm RV S prime:     10.58 cm/s TAPSE (M-mode): 1.8 cm LEFT ATRIUM             Index       RIGHT ATRIUM           Index LA diam:        4.80 cm 2.12 cm/m  RA Area:     13.50 cm LA Vol (A2C):   68.2 ml 30.06 ml/m RA Volume:   32.80 ml  14.46 ml/m LA Vol (A4C):   64.9 ml 28.60 ml/m LA Biplane Vol: 69.4 ml 30.59 ml/m   AORTA Ao Root diam: 3.10 cm MITRAL VALVE MV Area (PHT): 3.48 cm MV Decel Time: 218 msec MV E velocity: 121.00 cm/s MV A velocity: 88.10 cm/s MV E/A ratio:  1.37 Nelva Bush MD Electronically signed by Nelva Bush MD Signature Date/Time: 03/19/2020/7:00:37 AM    Final  Echo preserved overall left ventricular function ejection fraction of at least 55%  TELEMETRY: Normal sinus rhythm nonspecific ST-T changes  ASSESSMENT AND PLAN:  1 assessment Elevated troponin Shortness of breath Possible  anginal equivalent History of CVA Hypertension Renal insufficiency Obesity Coronary artery disease History of coronary bypass surgery Hypokalemia  Plan Patient is improved considerably Recommend discontinuing heparin and increase activity with ambulation in the halls This does not in my mind represent a non-STEMI this is probably just elevated troponins nonspecific Recommend continue medical therapy Continue hypertension management and control Referred patient back to nephrology for further evaluation and treatment Continue weight loss exercise portion control Supplemental oxygen as necessary Correct electrolytes especially potassium Consider evaluation of possible obstructive sleep apnea if not done already Consider discharge home soon Have the patient follow-up with cardiology as an outpatient with Dr. Quenten Raven, MD 03/20/2020 1:33 PM

## 2020-03-20 NOTE — Progress Notes (Addendum)
Hypoglycemic Event  CBG: 56  Treatment:4 oz juice Symptoms: Pt denies s/s hypoglycemia  Follow-up CBG: Time:2141 CBG Result 110  Possible Reasons for Event: insulin with poor dinner intake today   Comments/MD notified E. Stark Klein, NP made aware    Tawni Millers

## 2020-03-20 NOTE — Progress Notes (Signed)
Subjective: No new neurological complaints.    Objective: Current vital signs: BP (!) 145/61 (BP Location: Right Arm)   Pulse (!) 58   Temp 98 F (36.7 C) (Oral)   Resp 18   Ht 5\' 8"  (1.727 m)   Wt 114.9 kg   SpO2 100%   BMI 38.50 kg/m  Vital signs in last 24 hours: Temp:  [97.6 F (36.4 C)-98.3 F (36.8 C)] 98 F (36.7 C) (04/01 1137) Pulse Rate:  [56-64] 58 (04/01 1137) Resp:  [16-18] 18 (04/01 1137) BP: (127-156)/(47-70) 145/61 (04/01 1137) SpO2:  [97 %-100 %] 100 % (04/01 1137) Weight:  [114.9 kg] 114.9 kg (04/01 0359)  Intake/Output from previous day: 03/31 0701 - 04/01 0700 In: 848.5 [P.O.:360; I.V.:488.5] Out: 2800 [Urine:2800] Intake/Output this shift: Total I/O In: 360 [P.O.:360] Out: -  Nutritional status:  Diet Order            Diet heart healthy/carb modified Room service appropriate? Yes; Fluid consistency: Thin; Fluid restriction: 1500 mL Fluid  Diet effective now              Neurologic Exam: Mental Status: Alert, oriented, thought content appropriate.  Speech fluent without evidence of aphasia.  Able to follow 3 step commands without difficulty. Cranial Nerves: II: Visual fields grossly normal, pupils equal, round, reactive to light and accommodation III,IV, VI: ptosis not present, extra-ocular motions intact bilaterally V,VII: smile symmetric, facial light touch sensation normal bilaterally VIII: hearing normal bilaterally IX,X: gag reflex present XI: bilateral shoulder shrug XII: midline tongue extension Motor: 5/5 throughout Sensory: Pinprick and light touch decreased in the left upper and lower extremities  Lab Results: Basic Metabolic Panel: Recent Labs  Lab 03/18/20 1212 03/19/20 0432 03/20/20 0304  NA 140 139 137  K 3.0* 3.0* 3.4*  CL 105 106 106  CO2 22 21* 22  GLUCOSE 229* 171* 197*  BUN 36* 41* 54*  CREATININE 2.33* 2.42* 2.81*  CALCIUM 8.8* 8.9 8.6*  MG  --  1.8 1.8    Liver Function Tests: No results for  input(s): AST, ALT, ALKPHOS, BILITOT, PROT, ALBUMIN in the last 168 hours. No results for input(s): LIPASE, AMYLASE in the last 168 hours. No results for input(s): AMMONIA in the last 168 hours.  CBC: Recent Labs  Lab 03/18/20 1212 03/19/20 0432 03/20/20 0304  WBC 13.0* 14.7* 11.6*  NEUTROABS 10.0*  --  7.4  HGB 9.7* 9.6* 8.2*  HCT 28.3* 28.6* 24.7*  MCV 85.5 86.9 85.8  PLT 322 340 317    Cardiac Enzymes: No results for input(s): CKTOTAL, CKMB, CKMBINDEX, TROPONINI in the last 168 hours.  Lipid Panel: Recent Labs  Lab 03/19/20 0432  CHOL 116  TRIG 149  HDL 40*  CHOLHDL 2.9  VLDL 30  LDLCALC 46    CBG: Recent Labs  Lab 03/19/20 2005 03/19/20 2304 03/20/20 0400 03/20/20 0752 03/20/20 1135  GLUCAP 205* 128* 167* 160* 145*    Microbiology: Results for orders placed or performed during the hospital encounter of 03/18/20  Urine culture     Status: Abnormal   Collection Time: 03/18/20 12:12 PM   Specimen: Urine, Random  Result Value Ref Range Status   Specimen Description   Final    URINE, RANDOM Performed at [facility], [address], [place], [place] [address]    Special Requests   Final    NONE Performed at [facility], [address] [place], [place] [address]    Culture >=100,000 COLONIES/mL ENTEROCOCCUS FAECALIS (  A)  Final   Report Status 03/20/2020 FINAL  Final   Organism ID, Bacteria ENTEROCOCCUS FAECALIS (A)  Final      Susceptibility   Enterococcus faecalis - MIC*    AMPICILLIN <=2 SENSITIVE Sensitive     NITROFURANTOIN <=16 SENSITIVE Sensitive     VANCOMYCIN 1 SENSITIVE Sensitive     * >=100,000 COLONIES/mL ENTEROCOCCUS FAECALIS  Respiratory Panel by RT PCR (Flu A&B, Covid) - Nasopharyngeal Swab     Status: None   Collection Time: 03/18/20  2:23 PM   Specimen: Nasopharyngeal Swab  Result Value Ref Range Status   SARS Coronavirus 2 by RT PCR NEGATIVE NEGATIVE Final    Comment: (NOTE) SARS-CoV-2 target nucleic acids  are NOT DETECTED. The SARS-CoV-2 RNA is generally detectable in upper respiratoy specimens during the acute phase of infection. The lowest concentration of SARS-CoV-2 viral copies this assay can detect is 131 copies/mL. A negative result does not preclude SARS-Cov-2 infection and should not be used as the sole basis for treatment or other patient management decisions. A negative result may occur with  improper specimen collection/handling, submission of specimen other than nasopharyngeal swab, presence of viral mutation(s) within the areas targeted by this assay, and inadequate number of viral copies (<131 copies/mL). A negative result must be combined with clinical observations, patient history, and epidemiological information. The expected result is Negative. Fact Sheet for Patients:  PinkCheek.be Fact Sheet for Healthcare Providers:  GravelBags.it This test is not yet ap proved or cleared by the [place] FDA and  has been authorized for detection and/or diagnosis of SARS-CoV-2 by FDA under an Emergency Use Authorization (EUA). This EUA will remain  in effect (meaning this test can be used) for the duration of the COVID-19 declaration under Section 564(b)(1) of the Act, 21 U.S.C. section 360bbb-3(b)(1), unless the authorization is terminated or revoked sooner.    Influenza A by PCR NEGATIVE NEGATIVE Final   Influenza B by PCR NEGATIVE NEGATIVE Final    Comment: (NOTE) The Xpert Xpress SARS-CoV-2/FLU/RSV assay is intended as an aid in  the diagnosis of influenza from Nasopharyngeal swab specimens and  should not be used as a sole basis for treatment. Nasal washings and  aspirates are unacceptable for Xpert Xpress SARS-CoV-2/FLU/RSV  testing. Fact Sheet for Patients: PinkCheek.be Fact Sheet for Healthcare Providers: GravelBags.it This test is not yet approved or  cleared by the [place] FDA and  has been authorized for detection and/or diagnosis of SARS-CoV-2 by  FDA under an Emergency Use Authorization (EUA). This EUA will remain  in effect (meaning this test can be used) for the duration of the  Covid-19 declaration under Section 564(b)(1) of the Act, 21  U.S.C. section 360bbb-3(b)(1), unless the authorization is  terminated or revoked. Performed at [facility], [address] [place], [place] [address]     Coagulation Studies: Recent Labs    03/18/20 1349  LABPROT 15.5*  INR 1.2    Imaging: MR ANGIO HEAD WO CONTRAST  Result Date: 03/20/2020 CLINICAL DATA:  Stroke follow-up EXAM: MRA HEAD WITHOUT CONTRAST TECHNIQUE: Angiographic images of the Circle of Willis were obtained using MRA technique without intravenous contrast. COMPARISON:  Brain MRI from 2 days ago FINDINGS: The carotid and vertebral arteries are symmetric and patent. The basilar is smooth and widely patent. There is cavernous sinus atheromatous irregularity with 70% narrowing at the right anterior genu. Extensive atherosclerotic irregularity of the posterior cerebral arteries with high-grade narrowing just beyond the right PCA bifurcation. No  major branch occlusion. Negative for aneurysm. IMPRESSION: 1. Generalized intracranial atherosclerotic disease. 2. ~70% narrowing at the right anterior genu ICA. 3. Prominent atherosclerotic irregularity of the PCAs. 4. There is acute pontine stroke by prior MRI. The basilar is smooth and widely patent. Electronically Signed   By: Monte Fantasia M.D.   On: 03/20/2020 11:18   MR BRAIN WO CONTRAST  Result Date: 03/18/2020 CLINICAL DATA:  Old infarcts. Left-sided numbness. EXAM: MRI HEAD WITHOUT CONTRAST TECHNIQUE: Multiplanar, multiecho pulse sequences of the brain and surrounding structures were obtained without intravenous contrast. COMPARISON:  Brain MRI 07/21/2019 FINDINGS: BRAIN: Small focus of acute ischemia within the  dorsal right pons. No other diffusion abnormality. No acute hemorrhage. There are multiple old infarcts of the deep gray nuclei and cerebellum. There is an old posterior right MCA territory infarct. Multifocal white matter hyperintensity, most commonly due to chronic ischemic microangiopathy. Normal volume of brain parenchyma and CSF spaces. Midline structures are normal. VASCULAR: Major flow voids are preserved. Susceptibility-sensitive sequences show no chronic microhemorrhage or superficial siderosis. SKULL AND UPPER CERVICAL SPINE: Normal calvarium and skull base. Visualized upper cervical spine and soft tissues are normal. SINUSES/ORBITS: No fluid levels or advanced mucosal thickening. No mastoid or middle ear effusion. Normal orbits. IMPRESSION: 1. Small focus of acute ischemia within the dorsal right pons. No hemorrhage or mass effect. 2. Multiple old infarcts and findings of chronic small vessel ischemia. Electronically Signed   By: Ulyses Jarred M.D.   On: 03/18/2020 22:09   ECHOCARDIOGRAM COMPLETE  Result Date: 03/19/2020    ECHOCARDIOGRAM REPORT   Patient Name:   Sharon Arias Date of Exam: 03/18/2020 Medical Rec #:  [id number]      Height:       68.0 in Accession #:    [id number]     Weight:       255.8 lb Date of Birth:  Aug 31, 1961       BSA:          2.269 m Patient Age:    59 years       BP:           176/73 mmHg Patient Gender: F              HR:           68 bpm. Exam Location:  [facility] Procedure: 2D Echo, Cardiac Doppler and Color Doppler Indications:     B76.28 Acute Diastolic CHF  History:         Patient has prior history of Echocardiogram examinations, most                  recent 07/22/2019. Risk Factors:Diabetes and Morbid Obesity.                  Myocardial Infarction. CVA. Chronic kidney disease. Coronary                  artery disease.  Sonographer:     Wilford Sports Rodgers-Jones Referring Phys:  Unknown Foley NIU Diagnosing Phys: Nelva Bush MD IMPRESSIONS  1. Left ventricular ejection  fraction, by estimation, is 55 to 60%. The left ventricle has normal function. The left ventricle has no regional wall motion abnormalities. Left ventricular diastolic parameters are consistent with Grade II diastolic dysfunction (pseudonormalization). Elevated left atrial pressure.  2. Right ventricular systolic function is mildly reduced. The right ventricular size is normal.  3. Left atrial size was mildly dilated.  4. There is irregular thickening of the  interatrial septum, incompletely characterized on this study.  5. The mitral valve is degenerative. Trivial mitral valve regurgitation. No evidence of mitral stenosis.  6. The aortic valve was not well visualized. Aortic valve regurgitation is not visualized. No aortic stenosis is present.  7. The inferior vena cava is normal in size with greater than 50% respiratory variability, suggesting right atrial pressure of 3 mmHg. FINDINGS  Left Ventricle: Left ventricular ejection fraction, by estimation, is 55 to 60%. The left ventricle has normal function. The left ventricle has no regional wall motion abnormalities. The left ventricular internal cavity size was normal in size. There is  no left ventricular hypertrophy. Left ventricular diastolic parameters are consistent with Grade II diastolic dysfunction (pseudonormalization). Elevated left atrial pressure. Right Ventricle: The right ventricular size is normal. Right vetricular wall thickness was not assessed. Right ventricular systolic function is mildly reduced. Left Atrium: Left atrial size was mildly dilated. Right Atrium: Right atrial size was normal in size. Pericardium: There is no evidence of pericardial effusion. Mitral Valve: The mitral valve is degenerative in appearance. There is mild thickening of the mitral valve leaflet(s). There is mild calcification of the mitral valve leaflet(s). Mild mitral annular calcification. Trivial mitral valve regurgitation. No evidence of mitral valve stenosis. Tricuspid  Valve: The tricuspid valve is not well visualized. Tricuspid valve regurgitation is trivial. Aortic Valve: The aortic valve was not well visualized. Aortic valve regurgitation is not visualized. No aortic stenosis is present. Pulmonic Valve: The pulmonic valve was not well visualized. Pulmonic valve regurgitation is trivial. No evidence of pulmonic stenosis. Aorta: The aortic root is normal in size and structure. Pulmonary Artery: The pulmonary artery is not well seen. Venous: The inferior vena cava is normal in size with greater than 50% respiratory variability, suggesting right atrial pressure of 3 mmHg. IAS/Shunts: The interatrial septum was not well visualized.  LEFT VENTRICLE PLAX 2D LVIDd:         4.06 cm Diastology LVIDs:         2.74 cm LV e' lateral:   7.89 cm/s LV PW:         0.96 cm LV E/e' lateral: 15.3 LV IVS:        0.92 cm LV e' medial:    5.34 cm/s                        LV E/e' medial:  22.7  RIGHT VENTRICLE RV Basal diam:  3.77 cm RV S prime:     10.58 cm/s TAPSE (M-mode): 1.8 cm LEFT ATRIUM             Index       RIGHT ATRIUM           Index LA diam:        4.80 cm 2.12 cm/m  RA Area:     13.50 cm LA Vol (A2C):   68.2 ml 30.06 ml/m RA Volume:   32.80 ml  14.46 ml/m LA Vol (A4C):   64.9 ml 28.60 ml/m LA Biplane Vol: 69.4 ml 30.59 ml/m   AORTA Ao Root diam: 3.10 cm MITRAL VALVE MV Area (PHT): 3.48 cm MV Decel Time: 218 msec MV E velocity: 121.00 cm/s MV A velocity: 88.10 cm/s MV E/A ratio:  1.37 Nelva Bush MD Electronically signed by Nelva Bush MD Signature Date/Time: 03/19/2020/7:00:37 AM    Final     Medications:  I have reviewed the patient's current medications. Scheduled: . [START ON 03/21/2020] allopurinol  200 mg Oral Daily  . amLODipine  10 mg Oral QHS  . amoxicillin  500 mg Oral Q12H  . aspirin EC  81 mg Oral BH-q7a  . cholecalciferol  400 Units Oral Daily  . clopidogrel  75 mg Oral Daily  . dextromethorphan-guaiFENesin  1 tablet Oral BID  . enoxaparin (LOVENOX)  injection  30 mg Subcutaneous Q24H  . insulin aspart  0-9 Units Subcutaneous Q4H  . insulin aspart  10 Units Subcutaneous Q breakfast  . insulin aspart  16 Units Subcutaneous Q lunch  . insulin aspart  20 Units Subcutaneous Q supper  . insulin glargine  60 Units Subcutaneous QHS  . isosorbide mononitrate  30 mg Oral Daily  . magnesium oxide  400 mg Oral BID  . metoprolol tartrate  100 mg Oral BID  . rosuvastatin  40 mg Oral QHS    Assessment/Plan: 59 y.o. female with a medical history significant forhypertension, hyperlipidemia, diabetes mellitus, stroke, CKD-4, anemia, who presents with shortness breath and left sided numbness.  Patient on ASA. Plavix and statin as an outpatient.  MRI of the brain personally reviewed and shows an acute right dorsal midbrain infarct.  Etiology likely small vessel disease and related to poorly controlled HTN.  Recent carotid dopplers were unremarkable.  Echocardiogram shows no cardiac source of emboli with an EF of 55-60%.  A1c 7.7, LDL 46. MRA of the brain personally reviewed and shows 70% stenosis of the right ICA.  Vertebrobasilar system is unremarkable.  BP improved today.     Plan: 1. Continue statin 2. Continue ASA and Plavix 3. Vascular consult.  Since lesion is asymptomatic, patient may be seen on an outpatient basis but vascular service should be made aware.   4. Follow up with neurology on an outpatient basis.    No further neurologic intervention is recommended at this time.  If further questions arise, please call or page at that time.  Thank you for allowing neurology to participate in the care of this patient.    LOS: 2 days   Alexis Goodell, MD Neurology [phone] 03/20/2020  3:28 PM

## 2020-03-20 NOTE — Progress Notes (Signed)
ANTICOAGULATION CONSULT NOTE  Pharmacy Consult for heparin Indication: NSTEMI  No Known Allergies  Patient Measurements: Height: 5\' 8"  (172.7 cm) Weight: 253 lb 3.2 oz (114.9 kg) IBW/kg (Calculated) : 63.9 Heparin Dosing Weight: 90 kg  Vital Signs: Temp: 98.3 F (36.8 C) (04/01 0359) Temp Source: Oral (04/01 0359) BP: 127/47 (04/01 0359) Pulse Rate: 56 (04/01 0359)  Labs: Recent Labs    03/18/20 1212 03/18/20 1349 03/18/20 1447 03/18/20 2032 03/18/20 2032 03/19/20 0432 03/19/20 0432 03/19/20 0806 03/19/20 0951 03/19/20 1206 03/19/20 1855 03/20/20 0304  HGB 9.7*  --    < >  --   --  9.6*  --   --   --   --   --  8.2*  HCT 28.3*  --   --   --   --  28.6*  --   --   --   --   --  24.7*  PLT 322  --   --   --   --  340  --   --   --   --   --  317  APTT  --  44*  --   --   --   --   --   --   --   --   --   --   LABPROT  --  15.5*  --   --   --   --   --   --   --   --   --   --   INR  --  1.2  --   --   --   --   --   --   --   --   --   --   HEPARINUNFRC  --   --   --  <0.10*   < > 0.11*   < >  --   --  0.30 0.18* 0.25*  CREATININE 2.33*  --   --   --   --  2.42*  --   --   --   --   --  2.81*  TROPONINIHS 2,275*  --    < > 2,094*  --   --   --  1,432* 1,305*  --   --   --    < > = values in this interval not displayed.    Estimated Creatinine Clearance: 29 mL/min (A) (by C-G formula based on SCr of 2.81 mg/dL (H)).   Medical History: Past Medical History:  Diagnosis Date  . Anemia in chronic kidney disease 05/19/2016  . CAD (coronary artery disease)   . Chicken pox   . Chronic kidney disease    per dr sparks  . CVA (cerebral vascular accident) ([place])   . Detached retina   . Diabetes mellitus without complication ([place])    type 2  . Diabetic neuropathy ([place])   . Diabetic retinopathy ([place])    legally blind  . Hyperlipidemia   . Hypertension   . Myocardial infarction ([place])   . Obesity   . PONV (postoperative nausea and vomiting)   . Sciatica of right  side    going to see physiciatry    Assessment: 59 year old female presented with SOB and left-sided numbness. Initial troponin 2275 with trend up to 2884. Patient is not on anticoagulation PTA. Pharmacy consulted for heparin drip for NSTEMI.  3/31 0432 HL 0.11  3/31 1206 HL 0.3 3/31 1855 HL 0.18 4/1    0304 HL 0.25, subtherapeutic  Goal of Therapy:  Heparin level 0.3-0.7 units/ml Monitor platelets by anticoagulation protocol: Yes   Plan:  HL subtherapeutic, trending upwards. H/H worse, PLTs OK.  Will increase Heparin rate to 2100 units/hr and recheck HL ~ 6 hours after rate increased. CBC daily.  Ena Dawley, PharmD 03/20/2020,4:24 AM

## 2020-03-21 DIAGNOSIS — N179 Acute kidney failure, unspecified: Secondary | ICD-10-CM | POA: Diagnosis not present

## 2020-03-21 LAB — GLUCOSE, CAPILLARY
Glucose-Capillary: 140 mg/dL — ABNORMAL HIGH (ref 70–99)
Glucose-Capillary: 180 mg/dL — ABNORMAL HIGH (ref 70–99)
Glucose-Capillary: 186 mg/dL — ABNORMAL HIGH (ref 70–99)
Glucose-Capillary: 205 mg/dL — ABNORMAL HIGH (ref 70–99)
Glucose-Capillary: 166 mg/dL — ABNORMAL HIGH (ref 70–99)
Glucose-Capillary: 107 mg/dL — ABNORMAL HIGH (ref 70–99)

## 2020-03-21 LAB — BASIC METABOLIC PANEL
Anion gap: 12 (ref 5–15)
BUN: 60 mg/dL — ABNORMAL HIGH (ref 6–20)
CO2: 24 mmol/L (ref 22–32)
Calcium: 9.2 mg/dL (ref 8.9–10.3)
Chloride: 103 mmol/L (ref 98–111)
Creatinine, Ser: 2.99 mg/dL — ABNORMAL HIGH (ref 0.44–1.00)
GFR calc Af Amer: 19 mL/min — ABNORMAL LOW (ref >60)
GFR calc non Af Amer: 17 mL/min — ABNORMAL LOW (ref >60)
Glucose, Bld: 136 mg/dL — ABNORMAL HIGH (ref 70–99)
Potassium: 3.7 mmol/L (ref 3.5–5.1)
Sodium: 139 mmol/L (ref 135–145)

## 2020-03-21 MED ORDER — LACTATED RINGERS IV SOLN: INTRAVENOUS | Status: DC

## 2020-03-21 NOTE — Plan of Care (Signed)
Nutrition Education Note  RD consulted for nutrition education regarding new onset CHF.  59 y.o. female with medical history significant of hypertension, hyperlipidemia, diabetes mellitus, stroke, history of stroke, CKD-4, anemia, who presented with shortness breath.  RD provided "Low Sodium Nutrition Therapy" handout from the Academy of [employer]. Reviewed patient's dietary recall. Provided examples on ways to decrease sodium intake in diet. Discouraged intake of processed foods and use of salt shaker. Encouraged fresh fruits and vegetables as well as whole grain sources of carbohydrates to maximize fiber intake.   RD discussed why it is important for patient to adhere to diet recommendations, and emphasized the role of fluids, foods to avoid, and importance of weighing self daily. Teach back method used.  Expect good compliance.  Body mass index is 37.75 kg/m. Pt meets criteria for obesity based on current BMI.  Current diet order is HH/CHO modified, patient is consuming approximately 100% of meals at this time. Labs and medications reviewed. No further nutrition interventions warranted at this time. RD contact information provided. If additional nutrition issues arise, please re-consult RD.   Koleen Distance MS, RD, LDN Please refer to [facility] for RD and/or RD on-call/weekend/after hours pager

## 2020-03-21 NOTE — Progress Notes (Signed)
[facility] Cardiology    SUBJECTIVE: Patient states she feels reasonably well developed mild acute on chronic renal sufficiency now requiring some hydration denies any focal deficits except for mild numbness no shortness of breath feels reasonably well anticipated discharge is for tomorrow   Vitals:   03/21/20 0402 03/21/20 0500 03/21/20 0803 03/21/20 1142  BP: (!) 157/67  (!) 147/58 (!) 146/67  Pulse: 61  62 (!) 56  Resp: 16  18 20   Temp: 98.7 F (37.1 C)  97.8 F (36.6 C) 98.4 F (36.9 C)  TempSrc: Oral   Oral  SpO2: 99%  98% 100%  Weight:  112.6 kg    Height:         Intake/Output Summary (Last 24 hours) at 03/21/2020 1459 Last data filed at 03/21/2020 1330 Gross per 24 hour  Intake 720 ml  Output 1800 ml  Net -1080 ml      PHYSICAL EXAM  General: Well developed, well nourished, in no acute distress HEENT:  Normocephalic and atramatic Neck:  No JVD.  Lungs: Clear bilaterally to auscultation and percussion. Heart: HRRR . Normal S1 and S2 without gallops or murmurs.  Abdomen: Bowel sounds are positive, abdomen soft and non-tender  Msk:  Back normal, normal gait. Normal strength and tone for age. Extremities: No clubbing, cyanosis or edema.   Neuro: Alert and oriented X 3. Psych:  Good affect, responds appropriately   LABS: Basic Metabolic Panel: Recent Labs    03/19/20 0432 03/19/20 0432 03/20/20 0304 03/21/20 0439  NA 139   < > 137 139  K 3.0*   < > 3.4* 3.7  CL 106   < > 106 103  CO2 21*   < > 22 24  GLUCOSE 171*   < > 197* 136*  BUN 41*   < > 54* 60*  CREATININE 2.42*   < > 2.81* 2.99*  CALCIUM 8.9   < > 8.6* 9.2  MG 1.8  --  1.8  --    < > = values in this interval not displayed.   Liver Function Tests: No results for input(s): AST, ALT, ALKPHOS, BILITOT, PROT, ALBUMIN in the last 72 hours. No results for input(s): LIPASE, AMYLASE in the last 72 hours. CBC: Recent Labs    03/19/20 0432 03/20/20 0304  WBC 14.7* 11.6*  NEUTROABS  --  7.4  HGB 9.6*  8.2*  HCT 28.6* 24.7*  MCV 86.9 85.8  PLT 340 317   Cardiac Enzymes: No results for input(s): CKTOTAL, CKMB, CKMBINDEX, TROPONINI in the last 72 hours. BNP: Invalid input(s): POCBNP D-Dimer: No results for input(s): DDIMER in the last 72 hours. Hemoglobin A1C: No results for input(s): HGBA1C in the last 72 hours. Fasting Lipid Panel: Recent Labs    03/19/20 0432  CHOL 116  HDL 40*  LDLCALC 46  TRIG 149  CHOLHDL 2.9   Thyroid Function Tests: No results for input(s): TSH, T4TOTAL, T3FREE, THYROIDAB in the last 72 hours.  Invalid input(s): FREET3 Anemia Panel: No results for input(s): VITAMINB12, FOLATE, FERRITIN, TIBC, IRON, RETICCTPCT in the last 72 hours.  MR ANGIO HEAD WO CONTRAST  Result Date: 03/20/2020 CLINICAL DATA:  Stroke follow-up EXAM: MRA HEAD WITHOUT CONTRAST TECHNIQUE: Angiographic images of the Circle of Willis were obtained using MRA technique without intravenous contrast. COMPARISON:  Brain MRI from 2 days ago FINDINGS: The carotid and vertebral arteries are symmetric and patent. The basilar is smooth and widely patent. There is cavernous sinus atheromatous irregularity with 70% narrowing at the  right anterior genu. Extensive atherosclerotic irregularity of the posterior cerebral arteries with high-grade narrowing just beyond the right PCA bifurcation. No major branch occlusion. Negative for aneurysm. IMPRESSION: 1. Generalized intracranial atherosclerotic disease. 2. ~70% narrowing at the right anterior genu ICA. 3. Prominent atherosclerotic irregularity of the PCAs. 4. There is acute pontine stroke by prior MRI. The basilar is smooth and widely patent. Electronically Signed   By: Monte Fantasia M.D.   On: 03/20/2020 11:18     Echo preserved left ventricular function of 60%  TELEMETRY: Normalsinus bradycardia nonspecific findings  ASSESSMENT AND PLAN:  Principal Problem:   Acute CHF (congestive heart failure) (HCC) Active Problems:   Anemia in chronic  kidney disease   CKD (chronic kidney disease), stage IV (HCC)   Coronary artery disease   Hyperlipidemia, unspecified   HTN (hypertension)   Hypokalemia   Right pontine stroke (HCC)   Gout   Leukocytosis   Left sided numbness   AKI (acute kidney injury) ([place])    Plan Plan recommend hydration for acute on chronic renal insufficiency Patient has been maintained on diuretics for congestive heart failure symptoms Continue blood pressure control Recent stroke followed by neurology Cerebral obstruction 70% we will have the patient follow-up with vascular for outpatient assessment Mild obesity recommend weight loss exercise portion control Congestive heart failure reasonably stable status post diuretic therapy Correct electrolytes especially hypokalemia   Yolonda Kida, MD, 03/21/2020 2:59 PM

## 2020-03-21 NOTE — Progress Notes (Addendum)
Progress Note    XITLALY AULT  ELF:[id number] DOB: 10-07-1961  DOA: 03/18/2020 PCP: Idelle Crouch, MD      Brief Narrative:    Medical records reviewed and are as summarized below:  PENI RUPARD is an 59 y.o. female with medical history significant of hypertension, hyperlipidemia, diabetes mellitus, stroke, history of stroke, CKD-4, anemia, who presented with shortness breath.  Patient said that she had been having shortness of breath for more than 4 days, which has been progressively worsening she also complained of left-sided numbness, and tingling in left hand.      Assessment/Plan:   Principal Problem:   Acute CHF (congestive heart failure) (HCC) Active Problems:   Anemia in chronic kidney disease   CKD (chronic kidney disease), stage IV (HCC)   Coronary artery disease   Hyperlipidemia, unspecified   HTN (hypertension)   Hypokalemia   Right pontine stroke (HCC)   Gout   Leukocytosis   Left sided numbness   AKI (acute kidney injury) ([place])   Acute on chronic diastolic CHF (congestive heart failure) ([place]):  Lasix has been held.  2D echo showed EF estimated at 55 to 60% and grade 2 diastolic dysfunction -Monitor daily weight, intake and output and BMP.  Low-salt diet.  Fluid restriction Monitor REDs Vest reading  Anemia in chronic kidney disease:  H&H stable  AKI on CKD (chronic kidney disease), stage IV ([place]):  Creatinine is still getting worse.  Losartan and Lasix are still on hold.  Start IV fluids with Ringer's lactate infusion for AKI and monitor BMP. Baseline creatinine 1.7-2.0.   Hx of Coronary artery disease s/p CABG /elevated troponin probably from demand ischemia: Patient was seen by the cardiologist who does not think she has NSTEMI. Continue aspirin and statins  Hyperlipidemia, unspecified Fibrate was discontinued on 03/20/2020 because of low creatinine clearance/GFR  HTN:  -Continue home medications: Amlodipine,  metoprolol -hydralazine prn  Hypokalemia:  Improving.  Replete potassium  Hx of Stroke/new acute right pontine infarct with left sided numbness -continue aspirin, Plavix, statin MRA of the head showed 70% stenosis in the right anterior genu ICA.  Consult vascular surgeon for further evaluation.  Gout -Allopurinol has been decreased from 500 to 200 mg daily because of CKD stage IV  Leukocytosis:  Improving.  This is likely reactive.    Asymptomatic bacteriuria Urine culture showed Enterococcus faecalis.  However, patient said she has no urinary symptoms.  She said she had increased frequency of micturition a few days prior to admission when she first started taking Lasix at home.  No antibiotics for now.  She has been advised to watch out for any urinary symptoms or fever that might develop and notify PCP accordingly.  Body mass index is 37.75 kg/m.  (Morbid obesity)   Family Communication/Anticipated D/C date and plan/Code Status   DVT prophylaxis: IV heparin infusion Code Status: Full code Family Communication: Plan discussed with patient Disposition Plan: Patient is from home.  Plan to discharge her home tomorrow if creatinine improves close to baseline.     Subjective:   No chest pain, shortness of breath.  Numbness on the left side has improved.  She has been able to ambulate in the hallways.  Objective:    Vitals:   03/21/20 0402 03/21/20 0500 03/21/20 0803 03/21/20 1142  BP: (!) 157/67  (!) 147/58 (!) 146/67  Pulse: 61  62 (!) 56  Resp: 16  18 20   Temp: 98.7 F (37.1 C)  97.8 F (36.6 C) 98.4 F (36.9 C)  TempSrc: Oral   Oral  SpO2: 99%  98% 100%  Weight:  112.6 kg    Height:        Intake/Output Summary (Last 24 hours) at 03/21/2020 1455 Last data filed at 03/21/2020 1330 Gross per 24 hour  Intake 720 ml  Output 1800 ml  Net -1080 ml   Filed Weights   03/18/20 1819 03/20/20 0359 03/21/20 0500  Weight: 116 kg 114.9 kg 112.6 kg    Exam:  GEN:  NAD SKIN: No rash EYES: EOMI ENT: MMM CV: RRR PULM: CTA B ABD: soft, ND, NT, +BS CNS: AAO x 3, non focal EXT: improving b/l leg edema (1+ ), no tenderness   Data Reviewed:   I have personally reviewed following labs and imaging studies:  Labs: Labs show the following:   Basic Metabolic Panel: Recent Labs  Lab 03/18/20 1212 03/18/20 1212 03/19/20 0432 03/19/20 0432 03/20/20 0304 03/21/20 0439  NA 140  --  139  --  137 139  K 3.0*   < > 3.0*   < > 3.4* 3.7  CL 105  --  106  --  106 103  CO2 22  --  21*  --  22 24  GLUCOSE 229*  --  171*  --  197* 136*  BUN 36*  --  41*  --  54* 60*  CREATININE 2.33*  --  2.42*  --  2.81* 2.99*  CALCIUM 8.8*  --  8.9  --  8.6* 9.2  MG  --   --  1.8  --  1.8  --    < > = values in this interval not displayed.   GFR Estimated Creatinine Clearance: 27 mL/min (A) (by C-G formula based on SCr of 2.99 mg/dL (H)). Liver Function Tests: No results for input(s): AST, ALT, ALKPHOS, BILITOT, PROT, ALBUMIN in the last 168 hours. No results for input(s): LIPASE, AMYLASE in the last 168 hours. No results for input(s): AMMONIA in the last 168 hours. Coagulation profile Recent Labs  Lab 03/18/20 1349  INR 1.2    CBC: Recent Labs  Lab 03/18/20 1212 03/19/20 0432 03/20/20 0304  WBC 13.0* 14.7* 11.6*  NEUTROABS 10.0*  --  7.4  HGB 9.7* 9.6* 8.2*  HCT 28.3* 28.6* 24.7*  MCV 85.5 86.9 85.8  PLT 322 340 317   Cardiac Enzymes: No results for input(s): CKTOTAL, CKMB, CKMBINDEX, TROPONINI in the last 168 hours. BNP (last 3 results) No results for input(s): PROBNP in the last 8760 hours. CBG: Recent Labs  Lab 03/20/20 2140 03/20/20 2347 03/21/20 0413 03/21/20 0802 03/21/20 1143  GLUCAP 110* 140* 140* 180* 186*   D-Dimer: No results for input(s): DDIMER in the last 72 hours. Hgb A1c: No results for input(s): HGBA1C in the last 72 hours. Lipid Profile: Recent Labs    03/19/20 0432  CHOL 116  HDL 40*  LDLCALC 46  TRIG 149   CHOLHDL 2.9   Thyroid function studies: No results for input(s): TSH, T4TOTAL, T3FREE, THYROIDAB in the last 72 hours.  Invalid input(s): FREET3 Anemia work up: No results for input(s): VITAMINB12, FOLATE, FERRITIN, TIBC, IRON, RETICCTPCT in the last 72 hours. Sepsis Labs: Recent Labs  Lab 03/18/20 1212 03/19/20 0432 03/20/20 0304  WBC 13.0* 14.7* 11.6*    Microbiology Recent Results (from the past 240 hour(s))  Urine culture     Status: Abnormal   Collection Time: 03/18/20 12:12 PM   Specimen: Urine, Random  Result Value Ref Range Status   Specimen Description   Final    URINE, RANDOM Performed at [facility], [address] [place], [place] [address]    Special Requests   Final    NONE Performed at [facility], [address] [place], [place] [address]    Culture >=100,000 COLONIES/mL ENTEROCOCCUS FAECALIS (A)  Final   Report Status 03/20/2020 FINAL  Final   Organism ID, Bacteria ENTEROCOCCUS FAECALIS (A)  Final      Susceptibility   Enterococcus faecalis - MIC*    AMPICILLIN <=2 SENSITIVE Sensitive     NITROFURANTOIN <=16 SENSITIVE Sensitive     VANCOMYCIN 1 SENSITIVE Sensitive     * >=100,000 COLONIES/mL ENTEROCOCCUS FAECALIS  Respiratory Panel by RT PCR (Flu A&B, Covid) - Nasopharyngeal Swab     Status: None   Collection Time: 03/18/20  2:23 PM   Specimen: Nasopharyngeal Swab  Result Value Ref Range Status   SARS Coronavirus 2 by RT PCR NEGATIVE NEGATIVE Final    Comment: (NOTE) SARS-CoV-2 target nucleic acids are NOT DETECTED. The SARS-CoV-2 RNA is generally detectable in upper respiratoy specimens during the acute phase of infection. The lowest concentration of SARS-CoV-2 viral copies this assay can detect is 131 copies/mL. A negative result does not preclude SARS-Cov-2 infection and should not be used as the sole basis for treatment or other patient management decisions. A negative result may occur with  improper specimen  collection/handling, submission of specimen other than nasopharyngeal swab, presence of viral mutation(s) within the areas targeted by this assay, and inadequate number of viral copies (<131 copies/mL). A negative result must be combined with clinical observations, patient history, and epidemiological information. The expected result is Negative. Fact Sheet for Patients:  PinkCheek.be Fact Sheet for Healthcare Providers:  GravelBags.it This test is not yet ap proved or cleared by the [place] FDA and  has been authorized for detection and/or diagnosis of SARS-CoV-2 by FDA under an Emergency Use Authorization (EUA). This EUA will remain  in effect (meaning this test can be used) for the duration of the COVID-19 declaration under Section 564(b)(1) of the Act, 21 U.S.C. section 360bbb-3(b)(1), unless the authorization is terminated or revoked sooner.    Influenza A by PCR NEGATIVE NEGATIVE Final   Influenza B by PCR NEGATIVE NEGATIVE Final    Comment: (NOTE) The Xpert Xpress SARS-CoV-2/FLU/RSV assay is intended as an aid in  the diagnosis of influenza from Nasopharyngeal swab specimens and  should not be used as a sole basis for treatment. Nasal washings and  aspirates are unacceptable for Xpert Xpress SARS-CoV-2/FLU/RSV  testing. Fact Sheet for Patients: PinkCheek.be Fact Sheet for Healthcare Providers: GravelBags.it This test is not yet approved or cleared by the [place] FDA and  has been authorized for detection and/or diagnosis of SARS-CoV-2 by  FDA under an Emergency Use Authorization (EUA). This EUA will remain  in effect (meaning this test can be used) for the duration of the  Covid-19 declaration under Section 564(b)(1) of the Act, 21  U.S.C. section 360bbb-3(b)(1), unless the authorization is  terminated or revoked. Performed at [facility],  [address] [place], [place] [address]     Procedures and diagnostic studies:  MR ANGIO HEAD WO CONTRAST  Result Date: 03/20/2020 CLINICAL DATA:  Stroke follow-up EXAM: MRA HEAD WITHOUT CONTRAST TECHNIQUE: Angiographic images of the Circle of Willis were obtained using MRA technique without intravenous contrast. COMPARISON:  Brain MRI from 2 days ago FINDINGS: The  carotid and vertebral arteries are symmetric and patent. The basilar is smooth and widely patent. There is cavernous sinus atheromatous irregularity with 70% narrowing at the right anterior genu. Extensive atherosclerotic irregularity of the posterior cerebral arteries with high-grade narrowing just beyond the right PCA bifurcation. No major branch occlusion. Negative for aneurysm. IMPRESSION: 1. Generalized intracranial atherosclerotic disease. 2. ~70% narrowing at the right anterior genu ICA. 3. Prominent atherosclerotic irregularity of the PCAs. 4. There is acute pontine stroke by prior MRI. The basilar is smooth and widely patent. Electronically Signed   By: Monte Fantasia M.D.   On: 03/20/2020 11:18    Medications:   . allopurinol  200 mg Oral Daily  . amLODipine  10 mg Oral QHS  . aspirin EC  81 mg Oral BH-[id number]  . cholecalciferol  400 Units Oral Daily  . clopidogrel  75 mg Oral Daily  . dextromethorphan-guaiFENesin  1 tablet Oral BID  . enoxaparin (LOVENOX) injection  30 mg Subcutaneous Q24H  . insulin aspart  0-9 Units Subcutaneous Q4H  . insulin aspart  10 Units Subcutaneous Q breakfast  . insulin aspart  16 Units Subcutaneous Q lunch  . insulin aspart  20 Units Subcutaneous Q supper  . insulin glargine  60 Units Subcutaneous QHS  . isosorbide mononitrate  30 mg Oral Daily  . metoprolol tartrate  100 mg Oral BID  . rosuvastatin  40 mg Oral QHS  . sodium chloride flush  3 mL Intravenous Q12H   Continuous Infusions: . lactated ringers 75 mL/hr at 03/21/20 1200     LOS: 3 days   Tedi Hughson  [facility]     03/21/2020, 2:55 PM

## 2020-03-21 NOTE — Progress Notes (Signed)
[facility] Cardiology    SUBJECTIVE: Patient states he still feels reasonably well has improved tremendously since being admitted but still has significant numbness on 1 side of her body.  Is been evaluated by neurology.  Anticipate discharge home soon   Vitals:   03/21/20 0402 03/21/20 0500 03/21/20 0803 03/21/20 1142  BP: (!) 157/67  (!) 147/58 (!) 146/67  Pulse: 61  62 (!) 56  Resp: 16  18 20   Temp: 98.7 F (37.1 C)  97.8 F (36.6 C) 98.4 F (36.9 C)  TempSrc: Oral   Oral  SpO2: 99%  98% 100%  Weight:  112.6 kg    Height:         Intake/Output Summary (Last 24 hours) at 03/21/2020 1505 Last data filed at 03/21/2020 1330 Gross per 24 hour  Intake 720 ml  Output 1800 ml  Net -1080 ml      PHYSICAL EXAM  General: Well developed, well nourished, in no acute distress HEENT:  Normocephalic and atramatic Neck:  No JVD.  Lungs: Clear bilaterally to auscultation and percussion. Heart: HRRR . Normal S1 and S2 without gallops or murmurs.  Abdomen: Bowel sounds are positive, abdomen soft and non-tender  Msk:  Back normal, normal gait. Normal strength and tone for age. Extremities: No clubbing, cyanosis or edema.   Neuro: Alert and oriented X 3. Psych:  Good affect, responds appropriately   LABS: Basic Metabolic Panel: Recent Labs    03/19/20 0432 03/19/20 0432 03/20/20 0304 03/21/20 0439  NA 139   < > 137 139  K 3.0*   < > 3.4* 3.7  CL 106   < > 106 103  CO2 21*   < > 22 24  GLUCOSE 171*   < > 197* 136*  BUN 41*   < > 54* 60*  CREATININE 2.42*   < > 2.81* 2.99*  CALCIUM 8.9   < > 8.6* 9.2  MG 1.8  --  1.8  --    < > = values in this interval not displayed.   Liver Function Tests: No results for input(s): AST, ALT, ALKPHOS, BILITOT, PROT, ALBUMIN in the last 72 hours. No results for input(s): LIPASE, AMYLASE in the last 72 hours. CBC: Recent Labs    03/19/20 0432 03/20/20 0304  WBC 14.7* 11.6*  NEUTROABS  --  7.4  HGB 9.6* 8.2*  HCT 28.6* 24.7*  MCV 86.9 85.8   PLT 340 317   Cardiac Enzymes: No results for input(s): CKTOTAL, CKMB, CKMBINDEX, TROPONINI in the last 72 hours. BNP: Invalid input(s): POCBNP D-Dimer: No results for input(s): DDIMER in the last 72 hours. Hemoglobin A1C: No results for input(s): HGBA1C in the last 72 hours. Fasting Lipid Panel: Recent Labs    03/19/20 0432  CHOL 116  HDL 40*  LDLCALC 46  TRIG 149  CHOLHDL 2.9   Thyroid Function Tests: No results for input(s): TSH, T4TOTAL, T3FREE, THYROIDAB in the last 72 hours.  Invalid input(s): FREET3 Anemia Panel: No results for input(s): VITAMINB12, FOLATE, FERRITIN, TIBC, IRON, RETICCTPCT in the last 72 hours.  MR ANGIO HEAD WO CONTRAST  Result Date: 03/20/2020 CLINICAL DATA:  Stroke follow-up EXAM: MRA HEAD WITHOUT CONTRAST TECHNIQUE: Angiographic images of the Circle of Willis were obtained using MRA technique without intravenous contrast. COMPARISON:  Brain MRI from 2 days ago FINDINGS: The carotid and vertebral arteries are symmetric and patent. The basilar is smooth and widely patent. There is cavernous sinus atheromatous irregularity with 70% narrowing at the right anterior  genu. Extensive atherosclerotic irregularity of the posterior cerebral arteries with high-grade narrowing just beyond the right PCA bifurcation. No major branch occlusion. Negative for aneurysm. IMPRESSION: 1. Generalized intracranial atherosclerotic disease. 2. ~70% narrowing at the right anterior genu ICA. 3. Prominent atherosclerotic irregularity of the PCAs. 4. There is acute pontine stroke by prior MRI. The basilar is smooth and widely patent. Electronically Signed   By: Monte Fantasia M.D.   On: 03/20/2020 11:18     Echo normal left ventricular function ejection fraction of 60%  TELEMETRY: Sinus bradycardia nonspecific ST-T wave changes  ASSESSMENT AND PLAN:  Principal Problem:   Acute CHF (congestive heart failure) (HCC) Active Problems:   Anemia in chronic kidney disease   CKD  (chronic kidney disease), stage IV (HCC)   Coronary artery disease   Hyperlipidemia, unspecified   HTN (hypertension)   Hypokalemia   Right pontine stroke (HCC)   Gout   Leukocytosis   Left sided numbness   AKI (acute kidney injury) ([place])    Plan Agree with telemetry Continue neurology evaluation including MRI to rule out CVA Continue blood pressure management and control Recommend nephrology for evaluation and treatment of renal insufficiency acute on chronic Hypokalemia recommend correcting electrolytes Anemia mild probably of chronic disease   Yolonda Kida, MD 03/21/2020 3:05 PM

## 2020-03-21 NOTE — Care Management Important Message (Signed)
Important Message  Patient Details  Name: KALISTA LAGUARDIA MRN: [id number] Date of Birth: February 10, 1961   Medicare Important Message Given:  Yes  Initial Medicare IM given by Patient Access Associate on 03/20/2020 at 1:04pm.     Dannette Barbara 03/21/2020, 8:47 AM

## 2020-03-22 LAB — GLUCOSE, CAPILLARY
Glucose-Capillary: 123 mg/dL — ABNORMAL HIGH (ref 70–99)
Glucose-Capillary: 198 mg/dL — ABNORMAL HIGH (ref 70–99)
Glucose-Capillary: 225 mg/dL — ABNORMAL HIGH (ref 70–99)

## 2020-03-22 LAB — BASIC METABOLIC PANEL
Anion gap: 9 (ref 5–15)
BUN: 59 mg/dL — ABNORMAL HIGH (ref 6–20)
CO2: 24 mmol/L (ref 22–32)
Calcium: 8.9 mg/dL (ref 8.9–10.3)
Chloride: 105 mmol/L (ref 98–111)
Creatinine, Ser: 2.74 mg/dL — ABNORMAL HIGH (ref 0.44–1.00)
GFR calc Af Amer: 21 mL/min — ABNORMAL LOW (ref >60)
GFR calc non Af Amer: 18 mL/min — ABNORMAL LOW (ref >60)
Glucose, Bld: 150 mg/dL — ABNORMAL HIGH (ref 70–99)
Potassium: 4.3 mmol/L (ref 3.5–5.1)
Sodium: 138 mmol/L (ref 135–145)

## 2020-03-22 NOTE — Progress Notes (Signed)
Patient discharged to home. Tele and IV d/c'd. Patient verbalizes understanding of discharge instructions. 

## 2020-03-22 NOTE — Discharge Summary (Signed)
Physician Discharge Summary  Sharon Arias KPT:[id number] DOB: 09/22/1961 DOA: 03/18/2020  PCP: Idelle Crouch, MD  Admit date: 03/18/2020 Discharge date: 03/22/2020  Discharge disposition: Home   Recommendations for Outpatient Follow-Up:   Follow-up with cardiologist as scheduled Follow-up with nephrologist in 1 week Follow-up with vascular surgeon in 3 months Follow-up with neurologist in 1 month   Discharge Diagnosis:   Principal Problem:   Acute CHF (congestive heart failure) ([place]) Active Problems:   Anemia in chronic kidney disease   CKD (chronic kidney disease), stage IV ([place])   Coronary artery disease   Hyperlipidemia, unspecified   HTN (hypertension)   Hypokalemia   Right pontine stroke ([place])   Gout   Leukocytosis   Left sided numbness   AKI (acute kidney injury) ([place])    Discharge Condition: Stable.  Diet recommendation: Low-salt and diabetic diet  Code status: Full code.    Hospital Course:   Sharon Arias is an 59 y.o. female with medical history significant ofhypertension, hyperlipidemia, diabetes mellitus, stroke, history of stroke on aspirin and Plavix, CKD stage 4, anemia of chronic disease, who presented to the hospital with shortness breath. She said that she had been having shortness of breath for more than 4 days, which has been progressively worsening she also complained of left-sided numbness, and tingling in left hand.   She was admitted to the hospital for acute exacerbation of chronic diastolic CHF and acute right pontine infarct.  She was treated with IV Lasix and.  She was seen in consultation by the cardiologist because of initial concerns for NSTEMI.  However, cardiologist said that patient did not have NSTEMI.  She was also seen in consultation by the neurologist for acute stroke.  She recommended continuation of aspirin and Plavix.  Patient was found to have 70% stenosis in the right anterior genu ICA.  Vascular surgeon was  consulted for this but he recommended outpatient follow-up.  She developed acute kidney injury from diuresis.  Lasix was discontinued and she was treated briefly with IV fluids.  She was seen by her nephrologist who said patient's creatinine was okay for discharge.  She is medically stable for discharge.  She still has the 20 mg-Lasix pills that were recently prescribed by Dr. Juleen China, nephrologist.  She has been advised to continue taking her Lasix at home.    Discharge Exam:   Vitals:   03/22/20 0832 03/22/20 1145  BP:  (!) 149/57  Pulse: 67 (!) 56  Resp:  18  Temp:  98.4 F (36.9 C)  SpO2:  98%   Vitals:   03/22/20 0431 03/22/20 0746 03/22/20 0832 03/22/20 1145  BP: (!) 167/65 136/87  (!) 149/57  Pulse: (!) 58 (!) 59 67 (!) 56  Resp: 20 18  18   Temp: 98.4 F (36.9 C) 97.9 F (36.6 C)  98.4 F (36.9 C)  TempSrc: Oral Oral  Oral  SpO2: 99% 99%  98%  Weight: 113.7 kg     Height:         GEN: NAD SKIN: No rash EYES: EOMI ENT: MMM CV: RRR PULM: CTA B ABD: soft, obese, NT, +BS CNS: AAO x 3, non focal EXT: trace left leg and foot edema. No tenderness   The results of significant diagnostics from this hospitalization (including imaging, microbiology, ancillary and laboratory) are listed below for reference.     Procedures and Diagnostic Studies:   DG Chest 2 View  Result Date: 03/18/2020 CLINICAL DATA:  Shortness of  breath, LEFT side numbness since Friday, has UTI, history coronary artery disease post MI and bypass, hypertension, diabetes mellitus EXAM: CHEST - 2 VIEW COMPARISON:  03/18/2018 FINDINGS: Enlargement of cardiac silhouette post CABG. Pulmonary vascular congestion. Atherosclerotic calcification aorta. Interstitial infiltrates in both lungs new since previous exam favoring pulmonary edema and CHF. Small LEFT pleural effusion. No pneumothorax or acute osseous findings. IMPRESSION: Enlargement of cardiac silhouette post CABG with pulmonary vascular congestion  and pulmonary edema consistent with CHF. Small LEFT pleural effusion. Electronically Signed   By: Lavonia Dana M.D.   On: 03/18/2020 12:52   CT Head Wo Contrast  Result Date: 03/18/2020 CLINICAL DATA:  Left-sided numbness. EXAM: CT HEAD WITHOUT CONTRAST TECHNIQUE: Contiguous axial images were obtained from the base of the skull through the vertex without intravenous contrast. COMPARISON:  07/21/2019 FINDINGS: Brain: No evidence of acute infarction, hemorrhage, hydrocephalus, extra-axial collection or mass lesion/mass effect. Old right posterior parietal infarct with secondary encephalomalacia. Tiny old infarcts in the pons and left thalamus. Vascular: No hyperdense vessel or unexpected calcification. Skull: Normal. Negative for fracture or focal lesion. Sinuses/Orbits: Normal. Other: None IMPRESSION: No acute abnormality. Old infarcts. Electronically Signed   By: Lorriane Shire M.D.   On: 03/18/2020 13:38   MR BRAIN WO CONTRAST  Result Date: 03/18/2020 CLINICAL DATA:  Old infarcts. Left-sided numbness. EXAM: MRI HEAD WITHOUT CONTRAST TECHNIQUE: Multiplanar, multiecho pulse sequences of the brain and surrounding structures were obtained without intravenous contrast. COMPARISON:  Brain MRI 07/21/2019 FINDINGS: BRAIN: Small focus of acute ischemia within the dorsal right pons. No other diffusion abnormality. No acute hemorrhage. There are multiple old infarcts of the deep gray nuclei and cerebellum. There is an old posterior right MCA territory infarct. Multifocal white matter hyperintensity, most commonly due to chronic ischemic microangiopathy. Normal volume of brain parenchyma and CSF spaces. Midline structures are normal. VASCULAR: Major flow voids are preserved. Susceptibility-sensitive sequences show no chronic microhemorrhage or superficial siderosis. SKULL AND UPPER CERVICAL SPINE: Normal calvarium and skull base. Visualized upper cervical spine and soft tissues are normal. SINUSES/ORBITS: No fluid  levels or advanced mucosal thickening. No mastoid or middle ear effusion. Normal orbits. IMPRESSION: 1. Small focus of acute ischemia within the dorsal right pons. No hemorrhage or mass effect. 2. Multiple old infarcts and findings of chronic small vessel ischemia. Electronically Signed   By: Ulyses Jarred M.D.   On: 03/18/2020 22:09   ECHOCARDIOGRAM COMPLETE  Result Date: 03/19/2020    ECHOCARDIOGRAM REPORT   Patient Name:   Sharon Arias Date of Exam: 03/18/2020 Medical Rec #:  [id number]      Height:       68.0 in Accession #:    [id number]     Weight:       255.8 lb Date of Birth:  Dec 14, 1961       BSA:          2.269 m Patient Age:    87 years       BP:           176/73 mmHg Patient Gender: F              HR:           68 bpm. Exam Location:  [facility] Procedure: 2D Echo, Cardiac Doppler and Color Doppler Indications:     W29.56 Acute Diastolic CHF  History:         Patient has prior history of Echocardiogram examinations, most  recent 07/22/2019. Risk Factors:Diabetes and Morbid Obesity.                  Myocardial Infarction. CVA. Chronic kidney disease. Coronary                  artery disease.  Sonographer:     Wilford Sports Rodgers-Jones Referring Phys:  Unknown Foley NIU Diagnosing Phys: Nelva Bush MD IMPRESSIONS  1. Left ventricular ejection fraction, by estimation, is 55 to 60%. The left ventricle has normal function. The left ventricle has no regional wall motion abnormalities. Left ventricular diastolic parameters are consistent with Grade II diastolic dysfunction (pseudonormalization). Elevated left atrial pressure.  2. Right ventricular systolic function is mildly reduced. The right ventricular size is normal.  3. Left atrial size was mildly dilated.  4. There is irregular thickening of the interatrial septum, incompletely characterized on this study.  5. The mitral valve is degenerative. Trivial mitral valve regurgitation. No evidence of mitral stenosis.  6. The aortic valve was not well  visualized. Aortic valve regurgitation is not visualized. No aortic stenosis is present.  7. The inferior vena cava is normal in size with greater than 50% respiratory variability, suggesting right atrial pressure of 3 mmHg. FINDINGS  Left Ventricle: Left ventricular ejection fraction, by estimation, is 55 to 60%. The left ventricle has normal function. The left ventricle has no regional wall motion abnormalities. The left ventricular internal cavity size was normal in size. There is  no left ventricular hypertrophy. Left ventricular diastolic parameters are consistent with Grade II diastolic dysfunction (pseudonormalization). Elevated left atrial pressure. Right Ventricle: The right ventricular size is normal. Right vetricular wall thickness was not assessed. Right ventricular systolic function is mildly reduced. Left Atrium: Left atrial size was mildly dilated. Right Atrium: Right atrial size was normal in size. Pericardium: There is no evidence of pericardial effusion. Mitral Valve: The mitral valve is degenerative in appearance. There is mild thickening of the mitral valve leaflet(s). There is mild calcification of the mitral valve leaflet(s). Mild mitral annular calcification. Trivial mitral valve regurgitation. No evidence of mitral valve stenosis. Tricuspid Valve: The tricuspid valve is not well visualized. Tricuspid valve regurgitation is trivial. Aortic Valve: The aortic valve was not well visualized. Aortic valve regurgitation is not visualized. No aortic stenosis is present. Pulmonic Valve: The pulmonic valve was not well visualized. Pulmonic valve regurgitation is trivial. No evidence of pulmonic stenosis. Aorta: The aortic root is normal in size and structure. Pulmonary Artery: The pulmonary artery is not well seen. Venous: The inferior vena cava is normal in size with greater than 50% respiratory variability, suggesting right atrial pressure of 3 mmHg. IAS/Shunts: The interatrial septum was not well  visualized.  LEFT VENTRICLE PLAX 2D LVIDd:         4.06 cm Diastology LVIDs:         2.74 cm LV e' lateral:   7.89 cm/s LV PW:         0.96 cm LV E/e' lateral: 15.3 LV IVS:        0.92 cm LV e' medial:    5.34 cm/s                        LV E/e' medial:  22.7  RIGHT VENTRICLE RV Basal diam:  3.77 cm RV S prime:     10.58 cm/s TAPSE (M-mode): 1.8 cm LEFT ATRIUM             Index  RIGHT ATRIUM           Index LA diam:        4.80 cm 2.12 cm/m  RA Area:     13.50 cm LA Vol (A2C):   68.2 ml 30.06 ml/m RA Volume:   32.80 ml  14.46 ml/m LA Vol (A4C):   64.9 ml 28.60 ml/m LA Biplane Vol: 69.4 ml 30.59 ml/m   AORTA Ao Root diam: 3.10 cm MITRAL VALVE MV Area (PHT): 3.48 cm MV Decel Time: 218 msec MV E velocity: 121.00 cm/s MV A velocity: 88.10 cm/s MV E/A ratio:  1.37 Harrell Gave End MD Electronically signed by Nelva Bush MD Signature Date/Time: 03/19/2020/7:00:37 AM    Final      Labs:   Basic Metabolic Panel: Recent Labs  Lab 03/18/20 1212 03/18/20 1212 03/19/20 0432 03/19/20 0432 03/20/20 0304 03/20/20 0304 03/21/20 0439 03/22/20 0458  NA 140  --  139  --  137  --  139 138  K 3.0*   < > 3.0*   < > 3.4*   < > 3.7 4.3  CL 105  --  106  --  106  --  103 105  CO2 22  --  21*  --  22  --  24 24  GLUCOSE 229*  --  171*  --  197*  --  136* 150*  BUN 36*  --  41*  --  54*  --  60* 59*  CREATININE 2.33*  --  2.42*  --  2.81*  --  2.99* 2.74*  CALCIUM 8.8*  --  8.9  --  8.6*  --  9.2 8.9  MG  --   --  1.8  --  1.8  --   --   --    < > = values in this interval not displayed.   GFR Estimated Creatinine Clearance: 29.6 mL/min (A) (by C-G formula based on SCr of 2.74 mg/dL (H)). Liver Function Tests: No results for input(s): AST, ALT, ALKPHOS, BILITOT, PROT, ALBUMIN in the last 168 hours. No results for input(s): LIPASE, AMYLASE in the last 168 hours. No results for input(s): AMMONIA in the last 168 hours. Coagulation profile Recent Labs  Lab 03/18/20 1349  INR 1.2     CBC: Recent Labs  Lab 03/18/20 1212 03/19/20 0432 03/20/20 0304  WBC 13.0* 14.7* 11.6*  NEUTROABS 10.0*  --  7.4  HGB 9.7* 9.6* 8.2*  HCT 28.3* 28.6* 24.7*  MCV 85.5 86.9 85.8  PLT 322 340 317   Cardiac Enzymes: No results for input(s): CKTOTAL, CKMB, CKMBINDEX, TROPONINI in the last 168 hours. BNP: Invalid input(s): POCBNP CBG: Recent Labs  Lab 03/21/20 2013 03/21/20 2232 03/22/20 0015 03/22/20 0743 03/22/20 1142  GLUCAP 166* 107* 123* 198* 225*   D-Dimer No results for input(s): DDIMER in the last 72 hours. Hgb A1c No results for input(s): HGBA1C in the last 72 hours. Lipid Profile No results for input(s): CHOL, HDL, LDLCALC, TRIG, CHOLHDL, LDLDIRECT in the last 72 hours. Thyroid function studies No results for input(s): TSH, T4TOTAL, T3FREE, THYROIDAB in the last 72 hours.  Invalid input(s): FREET3 Anemia work up No results for input(s): VITAMINB12, FOLATE, FERRITIN, TIBC, IRON, RETICCTPCT in the last 72 hours. Microbiology Recent Results (from the past 240 hour(s))  Urine culture     Status: Abnormal   Collection Time: 03/18/20 12:12 PM   Specimen: Urine, Random  Result Value Ref Range Status   Specimen Description   Final  URINE, RANDOM Performed at [facility], [address] [place], [place] [address]    Special Requests   Final    NONE Performed at [facility], [address] [place], [place] [address]    Culture >=100,000 COLONIES/mL ENTEROCOCCUS FAECALIS (A)  Final   Report Status 03/20/2020 FINAL  Final   Organism ID, Bacteria ENTEROCOCCUS FAECALIS (A)  Final      Susceptibility   Enterococcus faecalis - MIC*    AMPICILLIN <=2 SENSITIVE Sensitive     NITROFURANTOIN <=16 SENSITIVE Sensitive     VANCOMYCIN 1 SENSITIVE Sensitive     * >=100,000 COLONIES/mL ENTEROCOCCUS FAECALIS  Respiratory Panel by RT PCR (Flu A&B, Covid) - Nasopharyngeal Swab     Status: None   Collection Time: 03/18/20  2:23 PM   Specimen:  Nasopharyngeal Swab  Result Value Ref Range Status   SARS Coronavirus 2 by RT PCR NEGATIVE NEGATIVE Final    Comment: (NOTE) SARS-CoV-2 target nucleic acids are NOT DETECTED. The SARS-CoV-2 RNA is generally detectable in upper respiratoy specimens during the acute phase of infection. The lowest concentration of SARS-CoV-2 viral copies this assay can detect is 131 copies/mL. A negative result does not preclude SARS-Cov-2 infection and should not be used as the sole basis for treatment or other patient management decisions. A negative result may occur with  improper specimen collection/handling, submission of specimen other than nasopharyngeal swab, presence of viral mutation(s) within the areas targeted by this assay, and inadequate number of viral copies (<131 copies/mL). A negative result must be combined with clinical observations, patient history, and epidemiological information. The expected result is Negative. Fact Sheet for Patients:  PinkCheek.be Fact Sheet for Healthcare Providers:  GravelBags.it This test is not yet ap proved or cleared by the [place] FDA and  has been authorized for detection and/or diagnosis of SARS-CoV-2 by FDA under an Emergency Use Authorization (EUA). This EUA will remain  in effect (meaning this test can be used) for the duration of the COVID-19 declaration under Section 564(b)(1) of the Act, 21 U.S.C. section 360bbb-3(b)(1), unless the authorization is terminated or revoked sooner.    Influenza A by PCR NEGATIVE NEGATIVE Final   Influenza B by PCR NEGATIVE NEGATIVE Final    Comment: (NOTE) The Xpert Xpress SARS-CoV-2/FLU/RSV assay is intended as an aid in  the diagnosis of influenza from Nasopharyngeal swab specimens and  should not be used as a sole basis for treatment. Nasal washings and  aspirates are unacceptable for Xpert Xpress SARS-CoV-2/FLU/RSV  testing. Fact Sheet for  Patients: PinkCheek.be Fact Sheet for Healthcare Providers: GravelBags.it This test is not yet approved or cleared by the [place] FDA and  has been authorized for detection and/or diagnosis of SARS-CoV-2 by  FDA under an Emergency Use Authorization (EUA). This EUA will remain  in effect (meaning this test can be used) for the duration of the  Covid-19 declaration under Section 564(b)(1) of the Act, 21  U.S.C. section 360bbb-3(b)(1), unless the authorization is  terminated or revoked. Performed at [facility], [address] [place], [place] [address]      Discharge Instructions:   Discharge Instructions    AMB referral to CHF clinic   Complete by: As directed    AMB referral to pulmonary rehabilitation   Complete by: As directed    Please select a program: Respiratory Care Services   Respiratory Care Services Diagnosis: Heart Failure   After initial evaluation and assessments completed: Virtual Based Care may  be provided alone or in conjunction with Pulmonary Rehab/Respiratory Care services based on patient barriers.: Yes   Diet - low sodium heart healthy   Complete by: As directed    Diet Carb Modified   Complete by: As directed    Increase activity slowly   Complete by: As directed      Allergies as of 03/22/2020   No Known Allergies     Medication List    STOP taking these medications   fenofibrate 160 MG tablet     TAKE these medications   acetaminophen 500 MG tablet Commonly known as: TYLENOL Take 1,000 mg by mouth every 8 (eight) hours as needed for pain.   allopurinol 100 MG tablet Commonly known as: ZYLOPRIM Take 200 mg by mouth at bedtime. What changed: Another medication with the same name was removed. Continue taking this medication, and follow the directions you see here.   amLODipine 10 MG tablet Commonly known as: NORVASC Take 10 mg by mouth at bedtime.   aspirin EC 81 MG  tablet Take 81 mg by mouth every morning.   Cholecalciferol 10 MCG (400 UNIT) Caps Take 10 mcg by mouth daily.   clopidogrel 75 MG tablet Commonly known as: PLAVIX Take 1 tablet (75 mg total) by mouth daily.   insulin lispro 100 UNIT/ML KwikPen Commonly known as: HUMALOG Inject 15-30 Units into the skin See admin instructions. Pt. Uses 15 un/ml in the mornings, 20un/ml in the afternoon, and 30 un/ml at bedtime   isosorbide mononitrate 30 MG 24 hr tablet Commonly known as: IMDUR Take 30 mg by mouth daily.   Lantus SoloStar 100 UNIT/ML Solostar Pen Generic drug: insulin glargine Inject 80 Units into the skin at bedtime.   losartan 100 MG tablet Commonly known as: COZAAR Take 100 mg by mouth daily.   metoprolol tartrate 100 MG tablet Commonly known as: LOPRESSOR Take 100 mg by mouth 2 (two) times a day.   rosuvastatin 40 MG tablet Commonly known as: CRESTOR Take 40 mg by mouth at bedtime.      Follow-up Information    [place] [facility] [place] Follow up on 03/28/2020.   Specialty: Cardiology Why: at 10:30am. Enter through the [place] entrance Contact information: [place] [phone]       Delana Meyer, Dolores Lory, MD Follow up in 3 month(s).   Specialties: Vascular Surgery, Cardiology, Radiology, Vascular Surgery Why: Can see [facility]chn[place] or Arna Medici. Will need carotid with visit.  Contact information: [place] [address] [phone]        [facility]. Schedule an appointment as soon as possible for a visit in 1 month(s).   Contact information: [place]       Lavonia Dana, MD. Schedule an appointment as soon as possible for a visit in 1 week(s).   Specialty: Nephrology Contact information: [address] [place] [address] [phone]             Time coordinating discharge: 28 minutes  Signed:  [facility] 03/22/2020, 11:57 AM

## 2020-03-22 NOTE — Progress Notes (Signed)
[facility]  ROUNDING NOTE   Subjective:   [name] admitted on 03/18/2020 for Dysuria [R30.0] Numbness [R20.0] SOB (shortness of breath) [R06.02] NSTEMI (non-ST elevated myocardial infarction) ([place]) [I21.4]   Found to have acute ischemic stroke of right dorsal pons.  Echocardiogram with diastolic dysfunction.   Patient was seen in my office on 3/25 where she was found to have hypertension and volume overload. She was asked to resume furosemide 20mg  PO daily. However she required IV furosemide on admission.   Furosemide was stopped yesterday and started on IV fluids due to rise in creatinine.   Objective:  Vital signs in last 24 hours:  Temp:  [97.7 F (36.5 C)-98.6 F (37 C)] 97.9 F (36.6 C) (04/03 0746) Pulse Rate:  [58-67] 67 (04/03 0832) Resp:  [18-20] 18 (04/03 0746) BP: (136-167)/(65-87) 136/87 (04/03 0746) SpO2:  [97 %-99 %] 99 % (04/03 0746) Weight:  [113.7 kg] 113.7 kg (04/03 0431)  Weight change: 1.043 kg Filed Weights   03/20/20 0359 03/21/20 0500 03/22/20 0431  Weight: 114.9 kg 112.6 kg 113.7 kg    Intake/Output: I/O last 3 completed shifts: In: 1375.5 [P.O.:600; I.V.:775.5] Out: 3050 [Urine:3050]   Intake/Output this shift:  No intake/output data recorded.  Physical Exam: General: NAD, sitting in chair  Head: Normocephalic, atraumatic. Moist oral mucosal membranes  Eyes: Anicteric, PERRL  Neck: Supple, trachea midline  Lungs:  Clear to auscultation  Heart: Regular rate and rhythm  Abdomen:  Soft, nontender,   Extremities: trace peripheral edema.  Neurologic: Nonfocal, moving all four extremities  Skin: No lesions        Basic Metabolic Panel: Recent Labs  Lab 03/18/20 1212 03/18/20 1212 03/19/20 0432 03/19/20 0432 03/20/20 0304 03/21/20 0439 03/22/20 0458  NA 140  --  139  --  137 139 138  K 3.0*  --  3.0*  --  3.4* 3.7 4.3  CL 105  --  106  --  106 103 105  CO2 22  --  21*  --  22 24 24   GLUCOSE 229*  --   171*  --  197* 136* 150*  BUN 36*  --  41*  --  54* 60* 59*  CREATININE 2.33*  --  2.42*  --  2.81* 2.99* 2.74*  CALCIUM 8.8*   < > 8.9   < > 8.6* 9.2 8.9  MG  --   --  1.8  --  1.8  --   --    < > = values in this interval not displayed.    Liver Function Tests: No results for input(s): AST, ALT, ALKPHOS, BILITOT, PROT, ALBUMIN in the last 168 hours. No results for input(s): LIPASE, AMYLASE in the last 168 hours. No results for input(s): AMMONIA in the last 168 hours.  CBC: Recent Labs  Lab 03/18/20 1212 03/19/20 0432 03/20/20 0304  WBC 13.0* 14.7* 11.6*  NEUTROABS 10.0*  --  7.4  HGB 9.7* 9.6* 8.2*  HCT 28.3* 28.6* 24.7*  MCV 85.5 86.9 85.8  PLT 322 340 317    Cardiac Enzymes: No results for input(s): CKTOTAL, CKMB, CKMBINDEX, TROPONINI in the last 168 hours.  BNP: Invalid input(s): POCBNP  CBG: Recent Labs  Lab 03/21/20 2013 03/21/20 2232 03/22/20 0015 03/22/20 0743 03/22/20 1142  GLUCAP 166* 107* 123* 198* 225*    Microbiology: Results for orders placed or performed during the hospital encounter of 03/18/20  Urine culture     Status: Abnormal   Collection Time: 03/18/20  12:12 PM   Specimen: Urine, Random  Result Value Ref Range Status   Specimen Description   Final    URINE, RANDOM Performed at [facility], [address] [place], [place] [address]    Special Requests   Final    NONE Performed at [facility], [address] [place], [place] [address]    Culture >=100,000 COLONIES/mL ENTEROCOCCUS FAECALIS (A)  Final   Report Status 03/20/2020 FINAL  Final   Organism ID, Bacteria ENTEROCOCCUS FAECALIS (A)  Final      Susceptibility   Enterococcus faecalis - MIC*    AMPICILLIN <=2 SENSITIVE Sensitive     NITROFURANTOIN <=16 SENSITIVE Sensitive     VANCOMYCIN 1 SENSITIVE Sensitive     * >=100,000 COLONIES/mL ENTEROCOCCUS FAECALIS  Respiratory Panel by RT PCR (Flu A&B, Covid) - Nasopharyngeal Swab     Status: None   Collection  Time: 03/18/20  2:23 PM   Specimen: Nasopharyngeal Swab  Result Value Ref Range Status   SARS Coronavirus 2 by RT PCR NEGATIVE NEGATIVE Final    Comment: (NOTE) SARS-CoV-2 target nucleic acids are NOT DETECTED. The SARS-CoV-2 RNA is generally detectable in upper respiratoy specimens during the acute phase of infection. The lowest concentration of SARS-CoV-2 viral copies this assay can detect is 131 copies/mL. A negative result does not preclude SARS-Cov-2 infection and should not be used as the sole basis for treatment or other patient management decisions. A negative result may occur with  improper specimen collection/handling, submission of specimen other than nasopharyngeal swab, presence of viral mutation(s) within the areas targeted by this assay, and inadequate number of viral copies (<131 copies/mL). A negative result must be combined with clinical observations, patient history, and epidemiological information. The expected result is Negative. Fact Sheet for Patients:  PinkCheek.be Fact Sheet for Healthcare Providers:  GravelBags.it This test is not yet ap proved or cleared by the [place] FDA and  has been authorized for detection and/or diagnosis of SARS-CoV-2 by FDA under an Emergency Use Authorization (EUA). This EUA will remain  in effect (meaning this test can be used) for the duration of the COVID-19 declaration under Section 564(b)(1) of the Act, 21 U.S.C. section 360bbb-3(b)(1), unless the authorization is terminated or revoked sooner.    Influenza A by PCR NEGATIVE NEGATIVE Final   Influenza B by PCR NEGATIVE NEGATIVE Final    Comment: (NOTE) The Xpert Xpress SARS-CoV-2/FLU/RSV assay is intended as an aid in  the diagnosis of influenza from Nasopharyngeal swab specimens and  should not be used as a sole basis for treatment. Nasal washings and  aspirates are unacceptable for Xpert Xpress  SARS-CoV-2/FLU/RSV  testing. Fact Sheet for Patients: PinkCheek.be Fact Sheet for Healthcare Providers: GravelBags.it This test is not yet approved or cleared by the [place] FDA and  has been authorized for detection and/or diagnosis of SARS-CoV-2 by  FDA under an Emergency Use Authorization (EUA). This EUA will remain  in effect (meaning this test can be used) for the duration of the  Covid-19 declaration under Section 564(b)(1) of the Act, 21  U.S.C. section 360bbb-3(b)(1), unless the authorization is  terminated or revoked. Performed at [facility], [address] [place], [place] [address]     Coagulation Studies: No results for input(s): LABPROT, INR in the last 72 hours.  Urinalysis: No results for input(s): COLORURINE, LABSPEC, PHURINE, GLUCOSEU, HGBUR, BILIRUBINUR, KETONESUR, PROTEINUR, UROBILINOGEN, NITRITE, LEUKOCYTESUR in the last 72 hours.  Invalid input(s): APPERANCEUR  Imaging: No results found.   Medications:   . lactated ringers 75 mL/hr at 03/22/20 0409   . allopurinol  200 mg Oral Daily  . amLODipine  10 mg Oral QHS  . aspirin EC  81 mg Oral BH-q7a  . cholecalciferol  400 Units Oral Daily  . clopidogrel  75 mg Oral Daily  . dextromethorphan-guaiFENesin  1 tablet Oral BID  . enoxaparin (LOVENOX) injection  30 mg Subcutaneous Q24H  . insulin aspart  0-9 Units Subcutaneous Q4H  . insulin aspart  10 Units Subcutaneous Q breakfast  . insulin aspart  16 Units Subcutaneous Q lunch  . insulin aspart  20 Units Subcutaneous Q supper  . insulin glargine  60 Units Subcutaneous QHS  . isosorbide mononitrate  30 mg Oral Daily  . metoprolol tartrate  100 mg Oral BID  . rosuvastatin  40 mg Oral QHS  . sodium chloride flush  3 mL Intravenous Q12H   acetaminophen, albuterol, hydrALAZINE, morphine injection, nitroGLYCERIN, ondansetron (ZOFRAN) IV  Assessment/ Plan:  Sharon Arias is  a 59 y.o. white female with hypertension, diabetes mellitus type II insulin dependent, CVAs, hyperlipidemia, diabetic retinopathy, diabetic neuropathy, coronary artery disease status post CABG, who is admitted to [facility] on 03/18/2020 for acute exacerbation of diastolic congestive heart failure and acute ischemic right dorsal pons CVA.   1. Acute kidney injury on chronic kidney disease stage IV with proteinuria: baseline creatinine of 2.39, GFR of 25.  Chronic kidney disease secondary to diabetic nephropathy Acute kidney injury secondary to acute cardiorenal syndrome from acute exacerbation of diastolic congestive heart failure.  - Discontinue IV fluids - resume losartan - resume PO furosemide: 40mg  PO daily - Follow up with Nephrology on 4/8.   2. Hypertension: 149/57. With acute exacerbation of diastolic congestive heart failure and acute ischemic stroke.  Currently with metoprolol, isosorbide mononitrate, and amlodipine.  - Losartan as above - Furosemide 40mg  PO daily - Fluid restriction: 64 fluid oz per day.   3. Diabetes mellitus type II with chronic kidney disease: insulin dependent. Hemoglobin A1c of 7.7% - big improvement Follows with Dr. Gabriel Carina, [facility].   4. Anemia of chronic kidney disease: hemoglobin 8.2. Not a candidate for ESA due to recent ischemic stroke.    LOS: 4 Shalaya Swailes 4/3/202111:44 AM

## 2020-03-24 ENCOUNTER — Telehealth: Payer: Self-pay | Admitting: Family

## 2020-03-24 LAB — GLUCOSE, CAPILLARY: Glucose-Capillary: 132 mg/dL — ABNORMAL HIGH (ref 70–99)

## 2020-03-24 NOTE — Telephone Encounter (Signed)
Lvm  4/5 regarding patients new patient CHF Clinic appointment scheduled for 4/9 and to see how patient is doing.   Alyse Low, [place]

## 2020-03-26 NOTE — Progress Notes (Signed)
Patient ID: Sharon Arias, female    DOB: 1961-04-26, 59 y.o.   MRN: [id number]  HPI  Sharon Arias is a 59 y/o female with a history of CAD, DM, hyperlipidemia, HTN, CKD, stroke, detached retina, MI, obesity, anemia and chronic heart failure.   Echo report from 03/18/20 reviewed and showed an EF of 55-60% along with trivial TR.  Admitted 03/18/20 due to acute on chronic HF and acute right pontine infarct. Cardiology and neurology consults obtained. Initially given IV lasix and then transitioned to oral diuretics. Diuretics were held shortly and IVF given to due AKI. Found to have 70% stenosis in right anterior ICA.  Discharged after 4 days.   She presents today for her initial visit with a chief complaint of moderate fatigue with little exertion. She describes this as chronic in nature having been present for several years. She says that she feels tired "all the time". She has associated fatigue, pedal edema, abdominal distention, back pain, decreased vision and difficulty sleeping along with this. She denies any dizziness, palpitations, chest pain, cough or weight gain.   Current scale will save the weight so she can take a picture of the weight and enlarge it. She is going to get a talking scale and is waiting on a talking BP cuff to arrive.   She has great difficulty with her vision due to her DM and has to take pictures of instructions and then enlarge it on her phone so that she can read it. May need dialysis in the future and wants to do it at home but doesn't think she will be able to see well enough and her husband has stage IV sarcoma and says that "realistically, he probably won't be around long".   Past Medical History:  Diagnosis Date  . Anemia in chronic kidney disease 05/19/2016  . CAD (coronary artery disease)   . CHF (congestive heart failure) ([place])   . Chicken pox   . Chronic kidney disease    per dr sparks  . CVA (cerebral vascular accident) ([place])   . Detached retina   .  Diabetes mellitus without complication ([place])    type 2  . Diabetic neuropathy ([place])   . Diabetic retinopathy ([place])    legally blind  . Hyperlipidemia   . Hypertension   . Myocardial infarction ([place])   . Obesity   . PONV (postoperative nausea and vomiting)   . Sciatica of right side    going to see physiciatry   Past Surgical History:  Procedure Laterality Date  . COLONOSCOPY WITH PROPOFOL N/A 01/26/2016   Procedure: COLONOSCOPY WITH PROPOFOL;  Surgeon: Lollie Sails, MD;  Location: [facility] ENDOSCOPY;  Service: Endoscopy;  Laterality: N/A;  . COLONOSCOPY WITH PROPOFOL N/A 01/27/2016   Procedure: COLONOSCOPY WITH PROPOFOL;  Surgeon: Lollie Sails, MD;  Location: [facility] ENDOSCOPY;  Service: Endoscopy;  Laterality: N/A;  . CORONARY ARTERY BYPASS GRAFT  2013  . INCISION AND DRAINAGE     chest abscess  . INCISION AND DRAINAGE ABSCESS N/A 12/11/2018   Procedure: INCISION AND DRAINAGE PERINEAL;  Surgeon: Jules Husbands, MD;  Location: [facility] ORS;  Service: General;  Laterality: N/A;  . RETINAL LASER PROCEDURE     Family History  Problem Relation Age of Onset  . Breast cancer Mother   . Prostate cancer Father        we think mets to liver and bone   Social History   Tobacco Use  . Smoking status: Never Smoker  .  Smokeless tobacco: Never Used  Substance Use Topics  . Alcohol use: No   Allergies  Allergen Reactions  . Ozempic (0.25 Or 0.5 Mg-Dose) [Semaglutide(0.25 Or 0.5mg -Dos)] Diarrhea and Nausea Only  . Trulicity [Dulaglutide] Diarrhea and Nausea Only   Prior to Admission medications   Medication Sig Start Date End Date Taking? Authorizing Provider  acetaminophen (TYLENOL) 500 MG tablet Take 1,000 mg by mouth every 8 (eight) hours as needed for pain. 12/18/18  Yes [provider]  allopurinol (ZYLOPRIM) 100 MG tablet Take 200 mg by mouth at bedtime. 05/15/19  Yes [provider]  amLODipine (NORVASC) 10 MG tablet Take 10 mg by mouth at bedtime. 12/04/18  Yes  [provider]  aspirin EC 81 MG tablet Take 81 mg by mouth every morning.   Yes [provider]  Cholecalciferol 10 MCG (400 UNIT) CAPS Take 10 mcg by mouth daily.   Yes [provider]  clopidogrel (PLAVIX) 75 MG tablet Take 1 tablet (75 mg total) by mouth daily. 07/23/19  Yes Vaughan Basta, MD  furosemide (LASIX) 20 MG tablet Take 20 mg by mouth daily.   Yes [provider]  Insulin Glargine (LANTUS SOLOSTAR) 100 UNIT/ML Solostar Pen Inject 80 Units into the skin at bedtime. 10/07/18  Yes [provider]  insulin lispro (HUMALOG) 100 UNIT/ML KwikPen Inject 15-30 Units into the skin See admin instructions. Pt. Uses 15 un/ml in the mornings, 20un/ml in the afternoon, and 30 un/ml at bedtime 10/02/18  Yes [provider]  isosorbide mononitrate (IMDUR) 30 MG 24 hr tablet Take 30 mg by mouth daily. 02/13/20  Yes [provider]  losartan (COZAAR) 100 MG tablet Take 100 mg by mouth daily. 09/27/18  Yes [provider]  metoprolol tartrate (LOPRESSOR) 100 MG tablet Take 100 mg by mouth 2 (two) times a day.  11/14/18  Yes [provider]  rosuvastatin (CRESTOR) 40 MG tablet Take 40 mg by mouth at bedtime.  03/13/20  Yes [provider]     Review of Systems  Constitutional: Positive for fatigue (all the time). Negative for appetite change.  HENT: Positive for congestion. Negative for postnasal drip and sore throat.   Eyes: Positive for visual disturbance (great vision loss).  Respiratory: Positive for shortness of breath (with moderate exertion). Negative for cough and chest tightness.   Cardiovascular: Positive for leg swelling. Negative for chest pain and palpitations.  Gastrointestinal: Positive for abdominal distention (at times). Negative for abdominal pain.  Genitourinary: Negative.   Musculoskeletal: Positive for back pain (chronic low back pain). Negative for neck pain.  Skin: Negative.    Allergic/Immunologic: Negative.   Neurological: Negative for dizziness and light-headedness.  Hematological: Negative for adenopathy. Does not bruise/bleed easily.  Psychiatric/Behavioral: Positive for sleep disturbance (sleeping on 1 pillow with CPAP). Negative for dysphoric mood. The patient is not nervous/anxious.     Vitals:   03/28/20 1054  BP: (!) 163/68  Pulse: (!) 56  Resp: 20  SpO2: 98%  Weight: 250 lb (113.4 kg)  Height: 5\' 7"  (1.702 m)   Wt Readings from Last 3 Encounters:  03/28/20 250 lb (113.4 kg)  03/22/20 250 lb 9.6 oz (113.7 kg)  07/21/19 250 lb (113.4 kg)   Lab Results  Component Value Date   CREATININE 2.74 (H) 03/22/2020   CREATININE 2.99 (H) 03/21/2020   CREATININE 2.81 (H) 03/20/2020    Physical Exam Vitals and nursing note reviewed.  Constitutional:      Appearance: Normal appearance.  HENT:  Head: Normocephalic and atraumatic.  Cardiovascular:     Rate and Rhythm: Regular rhythm. Bradycardia present.  Pulmonary:     Effort: Pulmonary effort is normal. No respiratory distress.     Breath sounds: No wheezing or rales.  Abdominal:     General: Abdomen is flat. There is no distension.     Palpations: Abdomen is soft.  Musculoskeletal:        General: No tenderness.     Cervical back: Normal range of motion and neck supple.     Right lower leg: Edema (1+ pitting) present.     Left lower leg: Edema (1+ pitting) present.  Skin:    General: Skin is warm and dry.  Neurological:     Mental Status: She is alert and oriented to person, place, and time. Mental status is at baseline.  Psychiatric:        Mood and Affect: Mood normal.        Behavior: Behavior normal.        Thought Content: Thought content normal.    Assessment & Plan:  1: Chronic heart failure with preserved ejection fraction (no structural changes)- - NYHA class III - euvolemic today - weighing daily and says that her weight has been stable; instructed to call for an  overnight weight gain of >2 pounds or a weekly weight gain of >5 pounds - she is planning on getting a talking scale - does not add salt and is using Mrs Deliah Boston for sodium; written dietary information and a low sodium cookbook were given to her as she says that she has a magnifier at home but can also take pictures of things and enlarge them on her phone so that she can read it - saw cardiology ([facility]) 02/29/20 - now taking furosemide 20mg  daily - BNP 03/18/20 was 851.0 - has received her 1st COVID vaccine   2: HTN- - BP initially elevated (163/68) but had improved upon recheck with manual cuff (142/68) - she is waiting on a talking BP cuff to arrive; encouraged her to only check daily as she says that she checks it numerous times/ day and then gets anxious if it's elevated - saw PCP ([facility]ks) 03/07/20 - BMP 03/22/20 reviewed and showed sodium 138, potassium 4.3, creatinine 2.74 and GFR 18  3: DM with CKD- - glucose at home today was 158 - saw nephrology [facility]) yesterday and returns 04/02/20 - discussion has been had about future dialysis - A1c 03/18/20 was 7.7%  4: Lymphedema- - stage 2 - limited in her ability to exercise due to vision difficulties - does wear compression socks but doesn't have them on today; showed patient how to feel for indent as she can't see if she has swelling; bases it on how her shoes fit - does elevate her legs when sitting for long periods of time - consider lymphapress compression boots if edema persists   Patient did not bring her medications nor a list. Each medication was verbally reviewed with the patient and she was encouraged to bring the bottles to every visit to confirm accuracy of list.  Return in 6 weeks or sooner for any questions/problems before then.

## 2020-03-28 ENCOUNTER — Ambulatory Visit: Payer: Medicare Other | Attending: Family | Admitting: Family

## 2020-03-28 ENCOUNTER — Other Ambulatory Visit: Payer: Self-pay

## 2020-03-28 ENCOUNTER — Encounter: Payer: Self-pay | Admitting: Family

## 2020-03-28 VITALS — BP 142/68 | HR 56 | Resp 20 | Ht 67.0 in | Wt 250.0 lb

## 2020-03-28 DIAGNOSIS — Z79899 Other long term (current) drug therapy: Secondary | ICD-10-CM | POA: Diagnosis not present

## 2020-03-28 DIAGNOSIS — Z794 Long term (current) use of insulin: Secondary | ICD-10-CM | POA: Diagnosis not present

## 2020-03-28 DIAGNOSIS — Z6839 Body mass index (BMI) 39.0-39.9, adult: Secondary | ICD-10-CM | POA: Insufficient documentation

## 2020-03-28 DIAGNOSIS — I252 Old myocardial infarction: Secondary | ICD-10-CM | POA: Insufficient documentation

## 2020-03-28 DIAGNOSIS — Z8673 Personal history of transient ischemic attack (TIA), and cerebral infarction without residual deficits: Secondary | ICD-10-CM | POA: Diagnosis not present

## 2020-03-28 DIAGNOSIS — Z7902 Long term (current) use of antithrombotics/antiplatelets: Secondary | ICD-10-CM | POA: Insufficient documentation

## 2020-03-28 DIAGNOSIS — E669 Obesity, unspecified: Secondary | ICD-10-CM | POA: Diagnosis not present

## 2020-03-28 DIAGNOSIS — N189 Chronic kidney disease, unspecified: Secondary | ICD-10-CM | POA: Diagnosis not present

## 2020-03-28 DIAGNOSIS — I509 Heart failure, unspecified: Secondary | ICD-10-CM | POA: Insufficient documentation

## 2020-03-28 DIAGNOSIS — E785 Hyperlipidemia, unspecified: Secondary | ICD-10-CM | POA: Insufficient documentation

## 2020-03-28 DIAGNOSIS — I890 Lymphedema, not elsewhere classified: Secondary | ICD-10-CM

## 2020-03-28 DIAGNOSIS — Z7982 Long term (current) use of aspirin: Secondary | ICD-10-CM | POA: Insufficient documentation

## 2020-03-28 DIAGNOSIS — E1140 Type 2 diabetes mellitus with diabetic neuropathy, unspecified: Secondary | ICD-10-CM | POA: Diagnosis not present

## 2020-03-28 DIAGNOSIS — I130 Hypertensive heart and chronic kidney disease with heart failure and stage 1 through stage 4 chronic kidney disease, or unspecified chronic kidney disease: Secondary | ICD-10-CM | POA: Diagnosis not present

## 2020-03-28 DIAGNOSIS — N184 Chronic kidney disease, stage 4 (severe): Secondary | ICD-10-CM

## 2020-03-28 DIAGNOSIS — I10 Essential (primary) hypertension: Secondary | ICD-10-CM

## 2020-03-28 DIAGNOSIS — E1122 Type 2 diabetes mellitus with diabetic chronic kidney disease: Secondary | ICD-10-CM | POA: Diagnosis not present

## 2020-03-28 DIAGNOSIS — Z951 Presence of aortocoronary bypass graft: Secondary | ICD-10-CM | POA: Diagnosis not present

## 2020-03-28 DIAGNOSIS — I2510 Atherosclerotic heart disease of native coronary artery without angina pectoris: Secondary | ICD-10-CM | POA: Insufficient documentation

## 2020-03-28 DIAGNOSIS — I5032 Chronic diastolic (congestive) heart failure: Secondary | ICD-10-CM

## 2020-03-28 NOTE — Patient Instructions (Addendum)
Continue weighing daily and call for an overnight weight gain of > 2 pounds or a weekly weight gain of >5 pounds.    Check blood pressure once daily and vary what times of the day you check it

## 2020-04-08 ENCOUNTER — Encounter: Payer: Self-pay | Admitting: *Deleted

## 2020-04-08 ENCOUNTER — Other Ambulatory Visit: Payer: Self-pay

## 2020-04-08 ENCOUNTER — Encounter: Payer: Medicare Other | Attending: Internal Medicine | Admitting: *Deleted

## 2020-04-08 DIAGNOSIS — I5032 Chronic diastolic (congestive) heart failure: Secondary | ICD-10-CM

## 2020-04-08 NOTE — Progress Notes (Signed)
Completed virtual orientation today.  EP evaluation is scheduled for Monday 4/26 at 3pm.  Documentation for diagnosis can be found in [facility] encounter 03/18/20.

## 2020-04-14 ENCOUNTER — Ambulatory Visit: Payer: Medicare Other

## 2020-04-22 ENCOUNTER — Encounter: Payer: Medicare Other | Attending: Internal Medicine | Admitting: *Deleted

## 2020-04-22 ENCOUNTER — Other Ambulatory Visit: Payer: Self-pay

## 2020-04-22 VITALS — Ht 67.2 in | Wt 251.9 lb

## 2020-04-22 DIAGNOSIS — Z8673 Personal history of transient ischemic attack (TIA), and cerebral infarction without residual deficits: Secondary | ICD-10-CM | POA: Insufficient documentation

## 2020-04-22 DIAGNOSIS — E785 Hyperlipidemia, unspecified: Secondary | ICD-10-CM | POA: Diagnosis not present

## 2020-04-22 DIAGNOSIS — Z79899 Other long term (current) drug therapy: Secondary | ICD-10-CM | POA: Insufficient documentation

## 2020-04-22 DIAGNOSIS — I2510 Atherosclerotic heart disease of native coronary artery without angina pectoris: Secondary | ICD-10-CM | POA: Insufficient documentation

## 2020-04-22 DIAGNOSIS — Z794 Long term (current) use of insulin: Secondary | ICD-10-CM | POA: Diagnosis not present

## 2020-04-22 DIAGNOSIS — Z7901 Long term (current) use of anticoagulants: Secondary | ICD-10-CM | POA: Diagnosis not present

## 2020-04-22 DIAGNOSIS — I130 Hypertensive heart and chronic kidney disease with heart failure and stage 1 through stage 4 chronic kidney disease, or unspecified chronic kidney disease: Secondary | ICD-10-CM | POA: Diagnosis not present

## 2020-04-22 DIAGNOSIS — N189 Chronic kidney disease, unspecified: Secondary | ICD-10-CM | POA: Insufficient documentation

## 2020-04-22 DIAGNOSIS — Z7902 Long term (current) use of antithrombotics/antiplatelets: Secondary | ICD-10-CM | POA: Diagnosis not present

## 2020-04-22 DIAGNOSIS — E1122 Type 2 diabetes mellitus with diabetic chronic kidney disease: Secondary | ICD-10-CM | POA: Insufficient documentation

## 2020-04-22 DIAGNOSIS — I5032 Chronic diastolic (congestive) heart failure: Secondary | ICD-10-CM | POA: Diagnosis present

## 2020-04-22 DIAGNOSIS — Z7982 Long term (current) use of aspirin: Secondary | ICD-10-CM | POA: Insufficient documentation

## 2020-04-22 NOTE — Progress Notes (Signed)
Pulmonary Individual Treatment Plan  Patient Details  Name: Sharon Arias MRN: [id number] Date of Birth: Mar 01, 1961 Referring Provider:     Pulmonary Rehab from 04/22/2020 in [facility] Cardiac and Pulmonary Rehab  Referring Provider  Felipa Furnace MD      Initial Encounter Date:    Pulmonary Rehab from 04/22/2020 in [facility] Cardiac and Pulmonary Rehab  Date  04/22/20      Visit Diagnosis: Heart failure, diastolic, chronic ([place])  Patient's Home Medications on Admission:  Current Outpatient Medications:  .  acetaminophen (TYLENOL) 500 MG tablet, Take 1,000 mg by mouth every 8 (eight) hours as needed for pain., Disp: , Rfl:  .  allopurinol (ZYLOPRIM) 100 MG tablet, Take 200 mg by mouth at bedtime., Disp: , Rfl:  .  amLODipine (NORVASC) 10 MG tablet, Take 10 mg by mouth at bedtime., Disp: , Rfl:  .  aspirin EC 81 MG tablet, Take 81 mg by mouth every morning., Disp: , Rfl:  .  Cholecalciferol 10 MCG (400 UNIT) CAPS, Take 10 mcg by mouth daily., Disp: , Rfl:  .  clopidogrel (PLAVIX) 75 MG tablet, Take 1 tablet (75 mg total) by mouth daily., Disp: 30 tablet, Rfl: 0 .  furosemide (LASIX) 20 MG tablet, Take 20 mg by mouth daily., Disp: , Rfl:  .  Insulin Glargine (LANTUS SOLOSTAR) 100 UNIT/ML Solostar Pen, Inject 80 Units into the skin at bedtime., Disp: , Rfl:  .  insulin lispro (HUMALOG) 100 UNIT/ML KwikPen, Inject 15-30 Units into the skin See admin instructions. Pt. Uses 15 un/ml in the mornings, 25un/ml in the afternoon, and 30 un/ml at bedtime, Disp: , Rfl:  .  isosorbide mononitrate (IMDUR) 30 MG 24 hr tablet, Take 30 mg by mouth daily., Disp: , Rfl:  .  losartan (COZAAR) 100 MG tablet, Take 100 mg by mouth daily., Disp: , Rfl:  .  metoprolol tartrate (LOPRESSOR) 100 MG tablet, Take 100 mg by mouth 2 (two) times a day. , Disp: , Rfl:  .  rosuvastatin (CRESTOR) 20 MG tablet, Take 20 mg by mouth at bedtime. , Disp: , Rfl:   Past Medical History: Past Medical History:  Diagnosis Date  . Anemia  in chronic kidney disease 05/19/2016  . CAD (coronary artery disease)   . CHF (congestive heart failure) ([place])   . Chicken pox   . Chronic kidney disease    per dr sparks  . CVA (cerebral vascular accident) ([place])   . Detached retina   . Diabetes mellitus without complication ([place])    type 2  . Diabetic neuropathy ([place])   . Diabetic retinopathy ([place])    legally blind  . Hyperlipidemia   . Hypertension   . Myocardial infarction ([place])   . Obesity   . PONV (postoperative nausea and vomiting)   . Sciatica of right side    going to see physiciatry    Tobacco Use: Social History   Tobacco Use  Smoking Status Never Smoker  Smokeless Tobacco Never Used    Labs: Recent Review Flowsheet Data    Labs for ITP Cardiac and Pulmonary Rehab Latest Ref Rng & Units 03/18/2018 07/22/2019 03/18/2020 03/19/2020   Cholestrol 0 - 200 mg/dL - 252(H) - 116   LDLCALC 0 - 99 mg/dL - UNABLE TO CALCULATE IF TRIGLYCERIDE OVER 400 mg/dL  - 46   LDLDIRECT 0 - 99 mg/dL - 141.7(H) - -   HDL >40 mg/dL - 30(L) - 40(L)   Trlycerides <150 mg/dL - 402(H) - 149  Hemoglobin A1c 4.8 - 5.6 % - 10.8(H) 7.7(H) -   HCO3 20.0 - 28.0 mmol/L 23.1 - - -   ACIDBASEDEF 0.0 - 2.0 mmol/L 1.8 - - -       Pulmonary Assessment Scores: Pulmonary Assessment Scores    Row Name 04/22/20 1335         ADL UCSD   ADL Phase  Entry     SOB Score total  51     Rest  0     Walk  2     Stairs  4     Bath  0     Dress  0     Shop  5       CAT Score   CAT Score  18       mMRC Score   mMRC Score  3        UCSD: Self-administered rating of dyspnea associated with activities of daily living (ADLs) 6-point scale (0 = "not at all" to 5 = "maximal or unable to do because of breathlessness")  Scoring Scores range from 0 to 120.  Minimally important difference is 5 units  CAT: CAT can identify the health impairment of COPD patients and is better correlated with disease progression.  CAT has a scoring range of zero to 40. The  CAT score is classified into four groups of low (less than 10), medium (10 - 20), high (21-30) and very high (31-40) based on the impact level of disease on health status. A CAT score over 10 suggests significant symptoms.  A worsening CAT score could be explained by an exacerbation, poor medication adherence, poor inhaler technique, or progression of COPD or comorbid conditions.  CAT MCID is 2 points  mMRC: mMRC (Modified Medical Research Council) Dyspnea Scale is used to assess the degree of baseline functional disability in patients of respiratory disease due to dyspnea. No minimal important difference is established. A decrease in score of 1 point or greater is considered a positive change.   Pulmonary Function Assessment:   Exercise Target Goals: Exercise Program Goal: Individual exercise prescription set using results from initial 6 min walk test and THRR while considering  patient's activity barriers and safety.   Exercise Prescription Goal: Initial exercise prescription builds to 30-45 minutes a day of aerobic activity, 2-3 days per week.  Home exercise guidelines will be given to patient during program as part of exercise prescription that the participant will acknowledge.  Education: Aerobic Exercise & Resistance Training: - Gives group verbal and written instruction on the various components of exercise. Focuses on aerobic and resistive training programs and the benefits of this training and how to safely progress through these programs..   Education: Exercise & Equipment Safety: - Individual verbal instruction and demonstration of equipment use and safety with use of the equipment.   Pulmonary Rehab from 04/22/2020 in [facility] Cardiac and Pulmonary Rehab  Date  04/22/20  Educator  [facility]  Instruction Review Code  1- Verbalizes Understanding      Education: Exercise Physiology & General Exercise Guidelines: - Group verbal and written instruction with models to review the exercise  physiology of the cardiovascular system and associated critical values. Provides general exercise guidelines with specific guidelines to those with heart or lung disease.    Education: Flexibility, Balance, Mind/Body Relaxation: Provides group verbal/written instruction on the benefits of flexibility and balance training, including mind/body exercise modes such as yoga, pilates and tai chi.  Demonstration and skill practice provided.  Activity Barriers & Risk Stratification: Activity Barriers & Cardiac Risk Stratification - 04/22/20 1336      Activity Barriers & Cardiac Risk Stratification   Activity Barriers  Back Problems;Other (comment);Balance Concerns;Muscular Weakness;Deconditioning;Shortness of Breath;History of Falls    Comments  GOUT, Vision Loss, painful to walk, just had crystals reset    Cardiac Risk Stratification  High       6 Minute Walk: 6 Minute Walk    Row Name 04/22/20 1330         6 Minute Walk   Phase  Initial     Distance  595 feet     Walk Time  4.72 minutes     # of Rest Breaks  2 38 sec, 49 sec then stopped     MPH  1.43     METS  1.46     RPE  17     Perceived Dyspnea   3     VO2 Peak  5.11     Symptoms  Yes (comment)     Comments  back pain 10/10, SOB, left sided weakness     Resting HR  55 bpm     Resting BP  146/74     Resting Oxygen Saturation   98 %     Exercise Oxygen Saturation  during 6 min walk  98 %     Max Ex. HR  69 bpm     Max Ex. BP  154/64     2 Minute Post BP  148/72       Interval HR   1 Minute HR  69     2 Minute HR  69     3 Minute HR  63     4 Minute HR  65     2 Minute Post HR  59     Interval Heart Rate?  Yes       Interval Oxygen   Interval Oxygen?  Yes     Baseline Oxygen Saturation %  98 %     1 Minute Oxygen Saturation %  99 %     1 Minute Liters of Oxygen  0 L Room Air     2 Minute Oxygen Saturation %  98 %     2 Minute Liters of Oxygen  0 L     3 Minute Oxygen Saturation %  98 %     3 Minute Liters of  Oxygen  0 L     4 Minute Oxygen Saturation %  98 %     4 Minute Liters of Oxygen  0 L     5 Minute Oxygen Saturation %  98 %     5 Minute Liters of Oxygen  0 L     2 Minute Post Oxygen Saturation %  98 %     2 Minute Post Liters of Oxygen  0 L       Oxygen Initial Assessment: Oxygen Initial Assessment - 04/08/20 1423      Home Oxygen   Home Oxygen Device  None    Sleep Oxygen Prescription  None    Home Exercise Oxygen Prescription  None    Home at Rest Exercise Oxygen Prescription  None      Initial 6 min Walk   Oxygen Used  None      Program Oxygen Prescription   Program Oxygen Prescription  None      Intervention   Short Term Goals  To learn and understand importance  of monitoring SPO2 with pulse oximeter and demonstrate accurate use of the pulse oximeter.;To learn and understand importance of maintaining oxygen saturations>88%;To learn and demonstrate proper pursed lip breathing techniques or other breathing techniques.    Long  Term Goals  Verbalizes importance of monitoring SPO2 with pulse oximeter and return demonstration;Maintenance of O2 saturations>88%;Exhibits proper breathing techniques, such as pursed lip breathing or other method taught during program session       Oxygen Re-Evaluation:   Oxygen Discharge (Final Oxygen Re-Evaluation):   Initial Exercise Prescription: Initial Exercise Prescription - 04/22/20 1300      Date of Initial Exercise RX and Referring Provider   Date  04/22/20    Referring Provider  Felipa Furnace MD      Treadmill   MPH  0.5    Grade  0    Minutes  15    METs  1.4      NuStep   Level  1    SPM  80    Minutes  15    METs  1.5      T5 Nustep   Level  1    SPM  80    Minutes  15    METs  1.5      Biostep-RELP   Level  1    SPM  50    Minutes  15    METs  1      Prescription Details   Frequency (times per week)  2    Duration  Progress to 30 minutes of continuous aerobic without signs/symptoms of physical  distress      Intensity   THRR 40-80% of Max Heartrate  98-141    Ratings of Perceived Exertion  11-13    Perceived Dyspnea  0-4      Progression   Progression  Continue to progress workloads to maintain intensity without signs/symptoms of physical distress.      Resistance Training   Training Prescription  Yes    Weight  3 lb    Reps  10-15       Perform Capillary Blood Glucose checks as needed.  Exercise Prescription Changes: Exercise Prescription Changes    Row Name 04/22/20 1300             Response to Exercise   Blood Pressure (Admit)  146/74       Blood Pressure (Exercise)  154/64       Blood Pressure (Exit)  142/80       Heart Rate (Admit)  55 bpm       Heart Rate (Exercise)  69 bpm       Heart Rate (Exit)  58 bpm       Oxygen Saturation (Admit)  98 %       Oxygen Saturation (Exercise)  98 %       Oxygen Saturation (Exit)  98 %       Rating of Perceived Exertion (Exercise)  17       Perceived Dyspnea (Exercise)  3       Symptoms  back pain 10/10, left sided weakness, SOB       Comments  walk test results          Exercise Comments:   Exercise Goals and Review: Exercise Goals    Row Name 04/22/20 1339             Exercise Goals   Increase Physical Activity  Yes       Intervention  Provide advice, education, support and counseling about physical activity/exercise needs.;Develop an individualized exercise prescription for aerobic and resistive training based on initial evaluation findings, risk stratification, comorbidities and participant's personal goals.       Expected Outcomes  Short Term: Attend rehab on a regular basis to increase amount of physical activity.;Long Term: Add in home exercise to make exercise part of routine and to increase amount of physical activity.;Long Term: Exercising regularly at least 3-5 days a week.       Increase Strength and Stamina  Yes       Intervention  Provide advice, education, support and counseling about physical  activity/exercise needs.;Develop an individualized exercise prescription for aerobic and resistive training based on initial evaluation findings, risk stratification, comorbidities and participant's personal goals.       Expected Outcomes  Short Term: Increase workloads from initial exercise prescription for resistance, speed, and METs.;Short Term: Perform resistance training exercises routinely during rehab and add in resistance training at home;Long Term: Improve cardiorespiratory fitness, muscular endurance and strength as measured by increased METs and functional capacity (6MWT)       Able to understand and use rate of perceived exertion (RPE) scale  Yes       Intervention  Provide education and explanation on how to use RPE scale       Expected Outcomes  Short Term: Able to use RPE daily in rehab to express subjective intensity level;Long Term:  Able to use RPE to guide intensity level when exercising independently       Able to understand and use Dyspnea scale  Yes       Intervention  Provide education and explanation on how to use Dyspnea scale       Expected Outcomes  Short Term: Able to use Dyspnea scale daily in rehab to express subjective sense of shortness of breath during exertion;Long Term: Able to use Dyspnea scale to guide intensity level when exercising independently       Knowledge and understanding of Target Heart Rate Range (THRR)  Yes       Intervention  Provide education and explanation of THRR including how the numbers were predicted and where they are located for reference       Expected Outcomes  Short Term: Able to state/look up THRR;Short Term: Able to use daily as guideline for intensity in rehab;Long Term: Able to use THRR to govern intensity when exercising independently       Able to check pulse independently  Yes       Intervention  Provide education and demonstration on how to check pulse in carotid and radial arteries.;Review the importance of being able to check your  own pulse for safety during independent exercise       Expected Outcomes  Short Term: Able to explain why pulse checking is important during independent exercise;Long Term: Able to check pulse independently and accurately       Understanding of Exercise Prescription  Yes       Intervention  Provide education, explanation, and written materials on patient's individual exercise prescription       Expected Outcomes  Short Term: Able to explain program exercise prescription;Long Term: Able to explain home exercise prescription to exercise independently          Exercise Goals Re-Evaluation :   Discharge Exercise Prescription (Final Exercise Prescription Changes): Exercise Prescription Changes - 04/22/20 1300      Response to Exercise   Blood Pressure (Admit)  146/74  Blood Pressure (Exercise)  154/64    Blood Pressure (Exit)  142/80    Heart Rate (Admit)  55 bpm    Heart Rate (Exercise)  69 bpm    Heart Rate (Exit)  58 bpm    Oxygen Saturation (Admit)  98 %    Oxygen Saturation (Exercise)  98 %    Oxygen Saturation (Exit)  98 %    Rating of Perceived Exertion (Exercise)  17    Perceived Dyspnea (Exercise)  3    Symptoms  back pain 10/10, left sided weakness, SOB    Comments  walk test results       Nutrition:  Target Goals: Understanding of nutrition guidelines, daily intake of sodium '1500mg'$ , cholesterol '200mg'$ , calories 30% from fat and 7% or less from saturated fats, daily to have 5 or more servings of fruits and vegetables.  Education: Controlling Sodium/Reading Food Labels -Group verbal and written material supporting the discussion of sodium use in heart healthy nutrition. Review and explanation with models, verbal and written materials for utilization of the food label.   Education: General Nutrition Guidelines/Fats and Fiber: -Group instruction provided by verbal, written material, models and posters to present the general guidelines for heart healthy nutrition. Gives an  explanation and review of dietary fats and fiber.   Biometrics: Pre Biometrics - 04/22/20 1333      Pre Biometrics   Height  5' 7.2" (1.707 m)    Weight  251 lb 14.4 oz (114.3 kg)    BMI (Calculated)  39.21    Single Leg Stand  0 seconds        Nutrition Therapy Plan and Nutrition Goals:   Nutrition Assessments: Nutrition Assessments - 04/22/20 1333      MEDFICTS Scores   Pre Score  27       MEDIFICTS Score Key:          ?70 Need to make dietary changes          40-70 Heart Healthy Diet         ? 40 Therapeutic Level Cholesterol Diet  Nutrition Goals Re-Evaluation:   Nutrition Goals Discharge (Final Nutrition Goals Re-Evaluation):   Psychosocial: Target Goals: Acknowledge presence or absence of significant depression and/or stress, maximize coping skills, provide positive support system. Participant is able to verbalize types and ability to use techniques and skills needed for reducing stress and depression.   Education: Depression - Provides group verbal and written instruction on the correlation between heart/lung disease and depressed mood, treatment options, and the stigmas associated with seeking treatment.   Education: Sleep Hygiene -Provides group verbal and written instruction about how sleep can affect your health.  Define sleep hygiene, discuss sleep cycles and impact of sleep habits. Review good sleep hygiene tips.    Education: Stress and Anxiety: - Provides group verbal and written instruction about the health risks of elevated stress and causes of high stress.  Discuss the correlation between heart/lung disease and anxiety and treatment options. Review healthy ways to manage with stress and anxiety.   Cardiac Rehab from 03/23/2017 in [facility] Cardiac and Pulmonary Rehab  Date  03/23/17  Educator  [facility]  Instruction Review Code (retired)  2- meets goals/outcomes      Initial Review & Psychosocial Screening: Initial Psych Review & Screening - 04/08/20 1414       Initial Review   Current issues with  Current Sleep Concerns    Source of Stress Concerns  Chronic Illness    Comments  Not able to sleep straight throught, but gets at least [place]?  Yes   husband     Barriers   Psychosocial barriers to participate in program  There are no identifiable barriers or psychosocial needs.;The patient should benefit from training in stress management and relaxation.      Screening Interventions   Interventions  Encouraged to exercise;To provide support and resources with identified psychosocial needs    Expected Outcomes  Short Term goal: Utilizing psychosocial counselor, staff and physician to assist with identification of specific Stressors or current issues interfering with healing process. Setting desired goal for each stressor or current issue identified.;Long Term Goal: Stressors or current issues are controlled or eliminated.;Short Term goal: Identification and review with participant of any Quality of Life or Depression concerns found by scoring the questionnaire.;Long Term goal: The participant improves quality of Life and PHQ9 Scores as seen by post scores and/or verbalization of changes       Quality of Life Scores:  Scores of 19 and below usually indicate a poorer quality of life in these areas.  A difference of  2-3 points is a clinically meaningful difference.  A difference of 2-3 points in the total score of the Quality of Life Index has been associated with significant improvement in overall quality of life, self-image, physical symptoms, and general health in studies assessing change in quality of life.  PHQ-9: Recent Review Flowsheet Data    Depression screen [facility] 2/9 04/22/2020 03/14/2017   Decreased Interest 0 0   Down, Depressed, Hopeless 0 0   PHQ - 2 Score 0 0   Altered sleeping 1 2    Tired, decreased energy 2 3    Change in appetite 0 0   Feeling bad or failure about yourself  0 0    Trouble concentrating 0 0   Moving slowly or fidgety/restless 0 0   Suicidal thoughts 0 0   PHQ-9 Score 3 5   Difficult doing work/chores Somewhat difficult Somewhat difficult     Interpretation of Total Score  Total Score Depression Severity:  1-4 = Minimal depression, 5-9 = Mild depression, 10-14 = Moderate depression, 15-19 = Moderately severe depression, 20-27 = Severe depression   Psychosocial Evaluation and Intervention: Psychosocial Evaluation - 04/08/20 1417      Psychosocial Evaluation & Interventions   Interventions  Encouraged to exercise with the program and follow exercise prescription    Comments  Sharon Arias is returning to rehab for heart failure.  She has a great support system in her husband.  She has a lot of chronic issues that she has been dealing with continuously.  She was just discharge from hospital from a second stroke. Her strokes have left her with a visual and hearing impairment.  Overall is does well mentally.  She does not sleep well continuously but gets at least 6 hours total each day.  She is hoping to build back her strength and stamina and improve her endurance.  She wants to be able to move better overall.    Expected Outcomes  Short: Attend rehab regularly to build stamina  Long: Continue to be able to do more and feel better    Continue Psychosocial Services   Follow up required by staff       Psychosocial Re-Evaluation:   Psychosocial Discharge (Final Psychosocial Re-Evaluation):   Education: Education Goals: Education classes will be provided on a weekly basis, covering required  topics. Participant will state understanding/return demonstration of topics presented.  Learning Barriers/Preferences: Learning Barriers/Preferences - 04/08/20 1414      Learning Barriers/Preferences   Learning Barriers  Sight;Hearing   impaired vision from stroke, and left sided hearing impairment   Learning Preferences  Audio;Verbal Instruction       General  Pulmonary Education Topics:  Infection Prevention: - Provides verbal and written material to individual with discussion of infection control including proper hand washing and proper equipment cleaning during exercise session.   Pulmonary Rehab from 04/22/2020 in [facility] Cardiac and Pulmonary Rehab  Date  04/22/20  Educator  [facility]  Instruction Review Code  1- Verbalizes Understanding      Falls Prevention: - Provides verbal and written material to individual with discussion of falls prevention and safety.   Pulmonary Rehab from 04/22/2020 in [facility] Cardiac and Pulmonary Rehab  Date  04/22/20  Educator  [facility]  Instruction Review Code  1- Verbalizes Understanding      Chronic Lung Diseases: - Group verbal and written instruction to review updates, respiratory medications, advancements in procedures and treatments. Discuss use of supplemental oxygen including available portable oxygen systems, continuous and intermittent flow rates, concentrators, personal use and safety guidelines. Review proper use of inhaler and spacers. Provide informative websites for self-education.    Energy Conservation: - Provide group verbal and written instruction for methods to conserve energy, plan and organize activities. Instruct on pacing techniques, use of adaptive equipment and posture/positioning to relieve shortness of breath.   Triggers and Exacerbations: - Group verbal and written instruction to review types of environmental triggers and ways to prevent exacerbations. Discuss weather changes, air quality and the benefits of nasal washing. Review warning signs and symptoms to help prevent infections. Discuss techniques for effective airway clearance, coughing, and vibrations.   AED/CPR: - Group verbal and written instruction with the use of models to demonstrate the basic use of the AED with the basic ABC's of resuscitation.   Anatomy and Physiology of the Lungs: - Group verbal and written instruction with the  use of models to provide basic lung anatomy and physiology related to function, structure and complications of lung disease.   Anatomy & Physiology of the Heart: - Group verbal and written instruction and models provide basic cardiac anatomy and physiology, with the coronary electrical and arterial systems. Review of Valvular disease and Heart Failure   Cardiac Rehab from 03/23/2017 in [facility] Cardiac and Pulmonary Rehab  Date  03/21/17  Educator  [facility]  Instruction Review Code (retired)  2- meets goals/outcomes      Cardiac Medications: - Group verbal and written instruction to review commonly prescribed medications for heart disease. Reviews the medication, class of the drug, and side effects.   Other: -Provides group and verbal instruction on various topics (see comments)   Knowledge Questionnaire Score: Knowledge Questionnaire Score - 04/22/20 1333      Knowledge Questionnaire Score   Pre Score  16/18 Education Focus: O2 safety        Core Components/Risk Factors/Patient Goals at Admission: Personal Goals and Risk Factors at Admission - 04/22/20 1334      Core Components/Risk Factors/Patient Goals on Admission    Weight Management  Yes;Weight Loss;Obesity    Intervention  Weight Management: Develop a combined nutrition and exercise program designed to reach desired caloric intake, while maintaining appropriate intake of nutrient and fiber, sodium and fats, and appropriate energy expenditure required for the weight goal.;Weight Management: Provide education and appropriate resources to help  participant work on and attain dietary goals.;Weight Management/Obesity: Establish reasonable short term and long term weight goals.;Obesity: Provide education and appropriate resources to help participant work on and attain dietary goals.    Admit Weight  251 lb 14.4 oz (114.3 kg)    Goal Weight: Short Term  246 lb (111.6 kg)    Goal Weight: Long Term  240 lb (108.9 kg)    Expected Outcomes  Short  Term: Continue to assess and modify interventions until short term weight is achieved;Long Term: Adherence to nutrition and physical activity/exercise program aimed toward attainment of established weight goal;Weight Loss: Understanding of general recommendations for a balanced deficit meal plan, which promotes 1-2 lb weight loss per week and includes a negative energy balance of [phone] kcal/d;Understanding recommendations for meals to include 15-35% energy as protein, 25-35% energy from fat, 35-60% energy from carbohydrates, less than '200mg'$  of dietary cholesterol, 20-35 gm of total fiber daily;Understanding of distribution of calorie intake throughout the day with the consumption of 4-5 meals/snacks    Improve shortness of breath with ADL's  Yes    Intervention  Provide education, individualized exercise plan and daily activity instruction to help decrease symptoms of SOB with activities of daily living.    Expected Outcomes  Short Term: Improve cardiorespiratory fitness to achieve a reduction of symptoms when performing ADLs;Long Term: Be able to perform more ADLs without symptoms or delay the onset of symptoms    Diabetes  Yes    Intervention  Provide education about signs/symptoms and action to take for hypo/hyperglycemia.;Provide education about proper nutrition, including hydration, and aerobic/resistive exercise prescription along with prescribed medications to achieve blood glucose in normal ranges: Fasting glucose 65-99 mg/dL    Expected Outcomes  Short Term: Participant verbalizes understanding of the signs/symptoms and immediate care of hyper/hypoglycemia, proper foot care and importance of medication, aerobic/resistive exercise and nutrition plan for blood glucose control.;Long Term: Attainment of HbA1C < 7%.    Heart Failure  Yes    Intervention  Provide a combined exercise and nutrition program that is supplemented with education, support and counseling about heart failure. Directed toward  relieving symptoms such as shortness of breath, decreased exercise tolerance, and extremity edema.    Expected Outcomes  Improve functional capacity of life;Short term: Attendance in program 2-3 days a week with increased exercise capacity. Reported lower sodium intake. Reported increased fruit and vegetable intake. Reports medication compliance.;Long term: Adoption of self-care skills and reduction of barriers for early signs and symptoms recognition and intervention leading to self-care maintenance.;Short term: Daily weights obtained and reported for increase. Utilizing diuretic protocols set by physician.    Hypertension  Yes    Intervention  Provide education on lifestyle modifcations including regular physical activity/exercise, weight management, moderate sodium restriction and increased consumption of fresh fruit, vegetables, and low fat dairy, alcohol moderation, and smoking cessation.;Monitor prescription use compliance.    Expected Outcomes  Short Term: Continued assessment and intervention until BP is < 140/75m HG in hypertensive participants. < 130/839mHG in hypertensive participants with diabetes, heart failure or chronic kidney disease.;Long Term: Maintenance of blood pressure at goal levels.    Lipids  Yes    Intervention  Provide education and support for participant on nutrition & aerobic/resistive exercise along with prescribed medications to achieve LDL '70mg'$ , HDL >'40mg'$ .    Expected Outcomes  Short Term: Participant states understanding of desired cholesterol values and is compliant with medications prescribed. Participant is following exercise prescription and nutrition guidelines.;Long Term: Cholesterol controlled  with medications as prescribed, with individualized exercise RX and with personalized nutrition plan. Value goals: LDL < 14m, HDL > 40 mg.       Education:Diabetes - Individual verbal and written instruction to review signs/symptoms of diabetes, desired ranges of glucose  level fasting, after meals and with exercise. Acknowledge that pre and post exercise glucose checks will be done for 3 sessions at entry of program.   [facility]  Date  04/22/20  Educ[facility]  [facility] Instruction Review Code  1- Verbalizes Understanding      Education: Know Your Numbers and Risk Factors: -Group verbal and written instruction about important numbers in your health.  Discussion of what are risk factors and how they play a role in the disease process.  Review of Cholesterol, Blood Pressure, Diabetes, and BMI and the role they play in your overall health.   Core Components/Risk Factors/Patient Goals Review:    Core Components/Risk Factors/Patient Goals at Discharge (Final Review):    ITP Comments: ITP Comments    Row Name 04/08/20 1426 04/22/20 1330         ITP Comments  Completed virtual orientation today.  EP evaluation is scheduled for Monday 4/26 at 3pm.  [facility]  Completed 6MWT and gym orientation.  Initial ITP created and sent for review to Dr. MEmily Filbert Medical Director.         Comments: Initial ITP

## 2020-04-22 NOTE — Patient Instructions (Signed)
Patient Instructions  Patient Details  Name: Sharon Arias MRN: [id number] Date of Birth: 02-02-1961 Referring Provider:  Idelle Crouch, MD  Below are your personal goals for exercise, nutrition, and risk factors. Our goal is to help you stay on track towards obtaining and maintaining these goals. We will be discussing your progress on these goals with you throughout the program.  Initial Exercise Prescription: Initial Exercise Prescription - 04/22/20 1300      Date of Initial Exercise RX and Referring Provider   Date  04/22/20    Referring Provider  Felipa Furnace MD      Treadmill   MPH  0.5    Grade  0    Minutes  15    METs  1.4      NuStep   Level  1    SPM  80    Minutes  15    METs  1.5      T5 Nustep   Level  1    SPM  80    Minutes  15    METs  1.5      Biostep-RELP   Level  1    SPM  50    Minutes  15    METs  1      Prescription Details   Frequency (times per week)  2    Duration  Progress to 30 minutes of continuous aerobic without signs/symptoms of physical distress      Intensity   THRR 40-80% of Max Heartrate  98-141    Ratings of Perceived Exertion  11-13    Perceived Dyspnea  0-4      Progression   Progression  Continue to progress workloads to maintain intensity without signs/symptoms of physical distress.      Resistance Training   Training Prescription  Yes    Weight  3 lb    Reps  10-15       Exercise Goals: Frequency: Be able to perform aerobic exercise two to three times per week in program working toward 2-5 days per week of home exercise.  Intensity: Work with a perceived exertion of 11 (fairly light) - 15 (hard) while following your exercise prescription.  We will make changes to your prescription with you as you progress through the program.   Duration: Be able to do 30 to 45 minutes of continuous aerobic exercise in addition to a 5 minute warm-up and a 5 minute cool-down routine.   Nutrition Goals: Your personal  nutrition goals will be established when you do your nutrition analysis with the dietician.  The following are general nutrition guidelines to follow: Cholesterol < 200mg /day Sodium < 1500mg /day Fiber: Women over 50 yrs - 21 grams per day  Personal Goals: Personal Goals and Risk Factors at Admission - 04/22/20 1334      Core Components/Risk Factors/Patient Goals on Admission    Weight Management  Yes;Weight Loss;Obesity    Intervention  Weight Management: Develop a combined nutrition and exercise program designed to reach desired caloric intake, while maintaining appropriate intake of nutrient and fiber, sodium and fats, and appropriate energy expenditure required for the weight goal.;Weight Management: Provide education and appropriate resources to help participant work on and attain dietary goals.;Weight Management/Obesity: Establish reasonable short term and long term weight goals.;Obesity: Provide education and appropriate resources to help participant work on and attain dietary goals.    Admit Weight  251 lb 14.4 oz (114.3 kg)    Goal Weight: Short Term  246 lb (111.6 kg)    Goal Weight: Long Term  240 lb (108.9 kg)    Expected Outcomes  Short Term: Continue to assess and modify interventions until short term weight is achieved;Long Term: Adherence to nutrition and physical activity/exercise program aimed toward attainment of established weight goal;Weight Loss: Understanding of general recommendations for a balanced deficit meal plan, which promotes 1-2 lb weight loss per week and includes a negative energy balance of [phone] kcal/d;Understanding recommendations for meals to include 15-35% energy as protein, 25-35% energy from fat, 35-60% energy from carbohydrates, less than 200mg  of dietary cholesterol, 20-35 gm of total fiber daily;Understanding of distribution of calorie intake throughout the day with the consumption of 4-5 meals/snacks    Improve shortness of breath with ADL's  Yes     Intervention  Provide education, individualized exercise plan and daily activity instruction to help decrease symptoms of SOB with activities of daily living.    Expected Outcomes  Short Term: Improve cardiorespiratory fitness to achieve a reduction of symptoms when performing ADLs;Long Term: Be able to perform more ADLs without symptoms or delay the onset of symptoms    Diabetes  Yes    Intervention  Provide education about signs/symptoms and action to take for hypo/hyperglycemia.;Provide education about proper nutrition, including hydration, and aerobic/resistive exercise prescription along with prescribed medications to achieve blood glucose in normal ranges: Fasting glucose 65-99 mg/dL    Expected Outcomes  Short Term: Participant verbalizes understanding of the signs/symptoms and immediate care of hyper/hypoglycemia, proper foot care and importance of medication, aerobic/resistive exercise and nutrition plan for blood glucose control.;Long Term: Attainment of HbA1C < 7%.    Heart Failure  Yes    Intervention  Provide a combined exercise and nutrition program that is supplemented with education, support and counseling about heart failure. Directed toward relieving symptoms such as shortness of breath, decreased exercise tolerance, and extremity edema.    Expected Outcomes  Improve functional capacity of life;Short term: Attendance in program 2-3 days a week with increased exercise capacity. Reported lower sodium intake. Reported increased fruit and vegetable intake. Reports medication compliance.;Long term: Adoption of self-care skills and reduction of barriers for early signs and symptoms recognition and intervention leading to self-care maintenance.;Short term: Daily weights obtained and reported for increase. Utilizing diuretic protocols set by physician.    Hypertension  Yes    Intervention  Provide education on lifestyle modifcations including regular physical activity/exercise, weight management,  moderate sodium restriction and increased consumption of fresh fruit, vegetables, and low fat dairy, alcohol moderation, and smoking cessation.;Monitor prescription use compliance.    Expected Outcomes  Short Term: Continued assessment and intervention until BP is < 140/38mm HG in hypertensive participants. < 130/21mm HG in hypertensive participants with diabetes, heart failure or chronic kidney disease.;Long Term: Maintenance of blood pressure at goal levels.    Lipids  Yes    Intervention  Provide education and support for participant on nutrition & aerobic/resistive exercise along with prescribed medications to achieve LDL 70mg , HDL >40mg .    Expected Outcomes  Short Term: Participant states understanding of desired cholesterol values and is compliant with medications prescribed. Participant is following exercise prescription and nutrition guidelines.;Long Term: Cholesterol controlled with medications as prescribed, with individualized exercise RX and with personalized nutrition plan. Value goals: LDL < 70mg , HDL > 40 mg.       Tobacco Use Initial Evaluation: Social History   Tobacco Use  Smoking Status Never Smoker  Smokeless Tobacco Never Used  Exercise Goals and Review: Exercise Goals    Row Name 04/22/20 1339             Exercise Goals   Increase Physical Activity  Yes       Intervention  Provide advice, education, support and counseling about physical activity/exercise needs.;Develop an individualized exercise prescription for aerobic and resistive training based on initial evaluation findings, risk stratification, comorbidities and participant's personal goals.       Expected Outcomes  Short Term: Attend rehab on a regular basis to increase amount of physical activity.;Long Term: Add in home exercise to make exercise part of routine and to increase amount of physical activity.;Long Term: Exercising regularly at least 3-5 days a week.       Increase Strength and Stamina  Yes        Intervention  Provide advice, education, support and counseling about physical activity/exercise needs.;Develop an individualized exercise prescription for aerobic and resistive training based on initial evaluation findings, risk stratification, comorbidities and participant's personal goals.       Expected Outcomes  Short Term: Increase workloads from initial exercise prescription for resistance, speed, and METs.;Short Term: Perform resistance training exercises routinely during rehab and add in resistance training at home;Long Term: Improve cardiorespiratory fitness, muscular endurance and strength as measured by increased METs and functional capacity (6MWT)       Able to understand and use rate of perceived exertion (RPE) scale  Yes       Intervention  Provide education and explanation on how to use RPE scale       Expected Outcomes  Short Term: Able to use RPE daily in rehab to express subjective intensity level;Long Term:  Able to use RPE to guide intensity level when exercising independently       Able to understand and use Dyspnea scale  Yes       Intervention  Provide education and explanation on how to use Dyspnea scale       Expected Outcomes  Short Term: Able to use Dyspnea scale daily in rehab to express subjective sense of shortness of breath during exertion;Long Term: Able to use Dyspnea scale to guide intensity level when exercising independently       Knowledge and understanding of Target Heart Rate Range (THRR)  Yes       Intervention  Provide education and explanation of THRR including how the numbers were predicted and where they are located for reference       Expected Outcomes  Short Term: Able to state/look up THRR;Short Term: Able to use daily as guideline for intensity in rehab;Long Term: Able to use THRR to govern intensity when exercising independently       Able to check pulse independently  Yes       Intervention  Provide education and demonstration on how to check pulse  in carotid and radial arteries.;Review the importance of being able to check your own pulse for safety during independent exercise       Expected Outcomes  Short Term: Able to explain why pulse checking is important during independent exercise;Long Term: Able to check pulse independently and accurately       Understanding of Exercise Prescription  Yes       Intervention  Provide education, explanation, and written materials on patient's individual exercise prescription       Expected Outcomes  Short Term: Able to explain program exercise prescription;Long Term: Able to explain home exercise prescription to exercise independently  Copy of goals given to participant.

## 2020-05-01 ENCOUNTER — Telehealth: Payer: Self-pay | Admitting: *Deleted

## 2020-05-01 ENCOUNTER — Ambulatory Visit: Payer: Medicare Other

## 2020-05-01 NOTE — Telephone Encounter (Signed)
Pt called and left message that she needs to pick up her husband today from hospital and would like to start next Thursday instead.  Returned her call to verify that we now have her scheduled to start on 5/20.

## 2020-05-05 ENCOUNTER — Encounter: Payer: Self-pay | Admitting: *Deleted

## 2020-05-05 ENCOUNTER — Telehealth: Payer: Self-pay | Admitting: *Deleted

## 2020-05-05 DIAGNOSIS — I5032 Chronic diastolic (congestive) heart failure: Secondary | ICD-10-CM

## 2020-05-05 NOTE — Telephone Encounter (Signed)
Sharon Arias left [place] a message yesterday.  Returned call today, left message.  She would like to delay her start in the program until June.  Her husband is still having to go back and forth to [facility] daily for treatments which makes it very hard for her to get here regularly since he is her means of transportation.  We will hold her start until June.

## 2020-05-06 ENCOUNTER — Ambulatory Visit: Payer: Medicare Other

## 2020-05-07 ENCOUNTER — Encounter: Payer: Self-pay | Admitting: *Deleted

## 2020-05-07 DIAGNOSIS — I5032 Chronic diastolic (congestive) heart failure: Secondary | ICD-10-CM

## 2020-05-07 NOTE — Progress Notes (Signed)
Pulmonary Individual Treatment Plan  Patient Details  Name: Sharon Arias MRN: [id number] Date of Birth: 1961-01-03 Referring Provider:     Pulmonary Rehab from 04/22/2020 in [facility] Cardiac and Pulmonary Rehab  Referring Provider  Felipa Furnace MD      Initial Encounter Date:    Pulmonary Rehab from 04/22/2020 in [facility] Cardiac and Pulmonary Rehab  Date  04/22/20      Visit Diagnosis: Heart failure, diastolic, chronic (Sharon Arias)  Patient's Home Medications on Admission:  Current Outpatient Medications:    acetaminophen (TYLENOL) 500 MG tablet, Take 1,000 mg by mouth every 8 (eight) hours as needed for pain., Disp: , Rfl:    allopurinol (ZYLOPRIM) 100 MG tablet, Take 200 mg by mouth at bedtime., Disp: , Rfl:    amLODipine (NORVASC) 10 MG tablet, Take 10 mg by mouth at bedtime., Disp: , Rfl:    aspirin EC 81 MG tablet, Take 81 mg by mouth every morning., Disp: , Rfl:    Cholecalciferol 10 MCG (400 UNIT) CAPS, Take 10 mcg by mouth daily., Disp: , Rfl:    clopidogrel (PLAVIX) 75 MG tablet, Take 1 tablet (75 mg total) by mouth daily., Disp: 30 tablet, Rfl: 0   furosemide (LASIX) 20 MG tablet, Take 20 mg by mouth daily., Disp: , Rfl:    Insulin Glargine (LANTUS SOLOSTAR) 100 UNIT/ML Solostar Pen, Inject 80 Units into the skin at bedtime., Disp: , Rfl:    insulin lispro (HUMALOG) 100 UNIT/ML KwikPen, Inject 15-30 Units into the skin See admin instructions. Pt. Uses 15 un/ml in the mornings, 25un/ml in the afternoon, and 30 un/ml at bedtime, Disp: , Rfl:    isosorbide mononitrate (IMDUR) 30 MG 24 hr tablet, Take 30 mg by mouth daily., Disp: , Rfl:    losartan (COZAAR) 100 MG tablet, Take 100 mg by mouth daily., Disp: , Rfl:    metoprolol tartrate (LOPRESSOR) 100 MG tablet, Take 100 mg by mouth 2 (two) times a day. , Disp: , Rfl:    rosuvastatin (CRESTOR) 20 MG tablet, Take 20 mg by mouth at bedtime. , Disp: , Rfl:   Past Medical History: Past Medical History:  Diagnosis Date   Anemia  in chronic kidney disease 05/19/2016   CAD (coronary artery disease)    CHF (congestive heart failure) (HCC)    Chicken pox    Chronic kidney disease    per dr sparks   CVA (cerebral vascular accident) (Sharon Arias)    Detached retina    Diabetes mellitus without complication (Sharon Arias)    type 2   Diabetic neuropathy (Sharon Arias)    Diabetic retinopathy (Sharon Arias)    legally blind   Hyperlipidemia    Hypertension    Myocardial infarction (Sharon Arias)    Obesity    PONV (postoperative nausea and vomiting)    Sciatica of right side    going to see physiciatry    Tobacco Use: Social History   Tobacco Use  Smoking Status Never Smoker  Smokeless Tobacco Never Used    Labs: Recent Review Flowsheet Data    Labs for ITP Cardiac and Pulmonary Rehab Latest Ref Rng & Units 03/18/2018 07/22/2019 03/18/2020 03/19/2020   Cholestrol 0 - 200 mg/dL - 252(H) - 116   LDLCALC 0 - 99 mg/dL - UNABLE TO CALCULATE IF TRIGLYCERIDE OVER 400 mg/dL  - 46   LDLDIRECT 0 - 99 mg/dL - 141.7(H) - -   HDL >40 mg/dL - 30(L) - 40(L)   Trlycerides <150 mg/dL - 402(H) - 149  Hemoglobin A1c 4.8 - 5.6 % - 10.8(H) 7.7(H) -   HCO3 20.0 - 28.0 mmol/L 23.1 - - -   ACIDBASEDEF 0.0 - 2.0 mmol/L 1.8 - - -       Pulmonary Assessment Scores: Pulmonary Assessment Scores    Row Name 04/22/20 1335         ADL UCSD   ADL Phase  Entry     SOB Score total  51     Rest  0     Walk  2     Stairs  4     Bath  0     Dress  0     Shop  5       CAT Score   CAT Score  18       mMRC Score   mMRC Score  3        UCSD: Self-administered rating of dyspnea associated with activities of daily living (ADLs) 6-point scale (0 = "not at all" to 5 = "maximal or unable to do because of breathlessness")  Scoring Scores range from 0 to 120.  Minimally important difference is 5 units  CAT: CAT can identify the health impairment of COPD patients and is better correlated with disease progression.  CAT has a scoring range of zero to 40. The  CAT score is classified into four groups of low (less than 10), medium (10 - 20), high (21-30) and very high (31-40) based on the impact level of disease on health status. A CAT score over 10 suggests significant symptoms.  A worsening CAT score could be explained by an exacerbation, poor medication adherence, poor inhaler technique, or progression of COPD or comorbid conditions.  CAT MCID is 2 points  mMRC: mMRC (Modified Medical Research Council) Dyspnea Scale is used to assess the degree of baseline functional disability in patients of respiratory disease due to dyspnea. No minimal important difference is established. A decrease in score of 1 point or greater is considered a positive change.   Pulmonary Function Assessment:   Exercise Target Goals: Exercise Program Goal: Individual exercise prescription set using results from initial 6 min walk test and THRR while considering  patients activity barriers and safety.   Exercise Prescription Goal: Initial exercise prescription builds to 30-45 minutes a day of aerobic activity, 2-3 days per week.  Home exercise guidelines will be given to patient during program as part of exercise prescription that the participant will acknowledge.  Education: Aerobic Exercise & Resistance Training: - Gives group verbal and written instruction on the various components of exercise. Focuses on aerobic and resistive training programs and the benefits of this training and how to safely progress through these programs..   Education: Exercise & Equipment Safety: - Individual verbal instruction and demonstration of equipment use and safety with use of the equipment.   Pulmonary Rehab from 04/22/2020 in [facility] Cardiac and Pulmonary Rehab  Date  04/22/20  Educator  [facility]  Instruction Review Code  1- Verbalizes Understanding      Education: Exercise Physiology & General Exercise Guidelines: - Group verbal and written instruction with models to review the exercise  physiology of the cardiovascular system and associated critical values. Provides general exercise guidelines with specific guidelines to those with heart or lung disease.    Education: Flexibility, Balance, Mind/Body Relaxation: Provides group verbal/written instruction on the benefits of flexibility and balance training, including mind/body exercise modes such as yoga, pilates and tai chi.  Demonstration and skill practice provided.  Activity Barriers & Risk Stratification: Activity Barriers & Cardiac Risk Stratification - 04/22/20 1336      Activity Barriers & Cardiac Risk Stratification   Activity Barriers  Back Problems;Other (comment);Balance Concerns;Muscular Weakness;Deconditioning;Shortness of Breath;History of Falls    Comments  GOUT, Vision Loss, painful to walk, just had crystals reset    Cardiac Risk Stratification  High       6 Minute Walk: 6 Minute Walk    Row Name 04/22/20 1330         6 Minute Walk   Phase  Initial     Distance  595 feet     Walk Time  4.72 minutes     # of Rest Breaks  2 38 sec, 49 sec then stopped     MPH  1.43     METS  1.46     RPE  17     Perceived Dyspnea   3     VO2 Peak  5.11     Symptoms  Yes (comment)     Comments  back pain 10/10, SOB, left sided weakness     Resting HR  55 bpm     Resting BP  146/74     Resting Oxygen Saturation   98 %     Exercise Oxygen Saturation  during 6 min walk  98 %     Max Ex. HR  69 bpm     Max Ex. BP  154/64     2 Minute Post BP  148/72       Interval HR   1 Minute HR  69     2 Minute HR  69     3 Minute HR  63     4 Minute HR  65     2 Minute Post HR  59     Interval Heart Rate?  Yes       Interval Oxygen   Interval Oxygen?  Yes     Baseline Oxygen Saturation %  98 %     1 Minute Oxygen Saturation %  99 %     1 Minute Liters of Oxygen  0 L Room Air     2 Minute Oxygen Saturation %  98 %     2 Minute Liters of Oxygen  0 L     3 Minute Oxygen Saturation %  98 %     3 Minute Liters of  Oxygen  0 L     4 Minute Oxygen Saturation %  98 %     4 Minute Liters of Oxygen  0 L     5 Minute Oxygen Saturation %  98 %     5 Minute Liters of Oxygen  0 L     2 Minute Post Oxygen Saturation %  98 %     2 Minute Post Liters of Oxygen  0 L       Oxygen Initial Assessment: Oxygen Initial Assessment - 04/08/20 1423      Home Oxygen   Home Oxygen Device  None    Sleep Oxygen Prescription  None    Home Exercise Oxygen Prescription  None    Home at Rest Exercise Oxygen Prescription  None      Initial 6 min Walk   Oxygen Used  None      Program Oxygen Prescription   Program Oxygen Prescription  None      Intervention   Short Term Goals  To learn and understand importance  of monitoring SPO2 with pulse oximeter and demonstrate accurate use of the pulse oximeter.;To learn and understand importance of maintaining oxygen saturations>88%;To learn and demonstrate proper pursed lip breathing techniques or other breathing techniques.    Long  Term Goals  Verbalizes importance of monitoring SPO2 with pulse oximeter and return demonstration;Maintenance of O2 saturations>88%;Exhibits proper breathing techniques, such as pursed lip breathing or other method taught during program session       Oxygen Re-Evaluation:   Oxygen Discharge (Final Oxygen Re-Evaluation):   Initial Exercise Prescription: Initial Exercise Prescription - 04/22/20 1300      Date of Initial Exercise RX and Referring Provider   Date  04/22/20    Referring Provider  Felipa Furnace MD      Treadmill   MPH  0.5    Grade  0    Minutes  15    METs  1.4      NuStep   Level  1    SPM  80    Minutes  15    METs  1.5      T5 Nustep   Level  1    SPM  80    Minutes  15    METs  1.5      Biostep-RELP   Level  1    SPM  50    Minutes  15    METs  1      Prescription Details   Frequency (times per week)  2    Duration  Progress to 30 minutes of continuous aerobic without signs/symptoms of physical  distress      Intensity   THRR 40-80% of Max Heartrate  98-141    Ratings of Perceived Exertion  11-13    Perceived Dyspnea  0-4      Progression   Progression  Continue to progress workloads to maintain intensity without signs/symptoms of physical distress.      Resistance Training   Training Prescription  Yes    Weight  3 lb    Reps  10-15       Perform Capillary Blood Glucose checks as needed.  Exercise Prescription Changes: Exercise Prescription Changes    Row Name 04/22/20 1300             Response to Exercise   Blood Pressure (Admit)  146/74       Blood Pressure (Exercise)  154/64       Blood Pressure (Exit)  142/80       Heart Rate (Admit)  55 bpm       Heart Rate (Exercise)  69 bpm       Heart Rate (Exit)  58 bpm       Oxygen Saturation (Admit)  98 %       Oxygen Saturation (Exercise)  98 %       Oxygen Saturation (Exit)  98 %       Rating of Perceived Exertion (Exercise)  17       Perceived Dyspnea (Exercise)  3       Symptoms  back pain 10/10, left sided weakness, SOB       Comments  walk test results          Exercise Comments:   Exercise Goals and Review: Exercise Goals    Row Name 04/22/20 1339             Exercise Goals   Increase Physical Activity  Yes       Intervention  Provide advice, education, support and counseling about physical activity/exercise needs.;Develop an individualized exercise prescription for aerobic and resistive training based on initial evaluation findings, risk stratification, comorbidities and participant's personal goals.       Expected Outcomes  Short Term: Attend rehab on a regular basis to increase amount of physical activity.;Long Term: Add in home exercise to make exercise part of routine and to increase amount of physical activity.;Long Term: Exercising regularly at least 3-5 days a week.       Increase Strength and Stamina  Yes       Intervention  Provide advice, education, support and counseling about physical  activity/exercise needs.;Develop an individualized exercise prescription for aerobic and resistive training based on initial evaluation findings, risk stratification, comorbidities and participant's personal goals.       Expected Outcomes  Short Term: Increase workloads from initial exercise prescription for resistance, speed, and METs.;Short Term: Perform resistance training exercises routinely during rehab and add in resistance training at home;Long Term: Improve cardiorespiratory fitness, muscular endurance and strength as measured by increased METs and functional capacity (6MWT)       Able to understand and use rate of perceived exertion (RPE) scale  Yes       Intervention  Provide education and explanation on how to use RPE scale       Expected Outcomes  Short Term: Able to use RPE daily in rehab to express subjective intensity level;Long Term:  Able to use RPE to guide intensity level when exercising independently       Able to understand and use Dyspnea scale  Yes       Intervention  Provide education and explanation on how to use Dyspnea scale       Expected Outcomes  Short Term: Able to use Dyspnea scale daily in rehab to express subjective sense of shortness of breath during exertion;Long Term: Able to use Dyspnea scale to guide intensity level when exercising independently       Knowledge and understanding of Target Heart Rate Range (THRR)  Yes       Intervention  Provide education and explanation of THRR including how the numbers were predicted and where they are located for reference       Expected Outcomes  Short Term: Able to state/look up THRR;Short Term: Able to use daily as guideline for intensity in rehab;Long Term: Able to use THRR to govern intensity when exercising independently       Able to check pulse independently  Yes       Intervention  Provide education and demonstration on how to check pulse in carotid and radial arteries.;Review the importance of being able to check your  own pulse for safety during independent exercise       Expected Outcomes  Short Term: Able to explain why pulse checking is important during independent exercise;Long Term: Able to check pulse independently and accurately       Understanding of Exercise Prescription  Yes       Intervention  Provide education, explanation, and written materials on patient's individual exercise prescription       Expected Outcomes  Short Term: Able to explain program exercise prescription;Long Term: Able to explain home exercise prescription to exercise independently          Exercise Goals Re-Evaluation :   Discharge Exercise Prescription (Final Exercise Prescription Changes): Exercise Prescription Changes - 04/22/20 1300      Response to Exercise   Blood Pressure (Admit)  146/74  Blood Pressure (Exercise)  154/64    Blood Pressure (Exit)  142/80    Heart Rate (Admit)  55 bpm    Heart Rate (Exercise)  69 bpm    Heart Rate (Exit)  58 bpm    Oxygen Saturation (Admit)  98 %    Oxygen Saturation (Exercise)  98 %    Oxygen Saturation (Exit)  98 %    Rating of Perceived Exertion (Exercise)  17    Perceived Dyspnea (Exercise)  3    Symptoms  back pain 10/10, left sided weakness, SOB    Comments  walk test results       Nutrition:  Target Goals: Understanding of nutrition guidelines, daily intake of sodium '1500mg'$ , cholesterol '200mg'$ , calories 30% from fat and 7% or less from saturated fats, daily to have 5 or more servings of fruits and vegetables.  Education: Controlling Sodium/Reading Food Labels -Group verbal and written material supporting the discussion of sodium use in heart healthy nutrition. Review and explanation with models, verbal and written materials for utilization of the food label.   Education: General Nutrition Guidelines/Fats and Fiber: -Group instruction provided by verbal, written material, models and posters to present the general guidelines for heart healthy nutrition. Gives an  explanation and review of dietary fats and fiber.   Biometrics: Pre Biometrics - 04/22/20 1333      Pre Biometrics   Height  5' 7.2" (1.707 m)    Weight  251 lb 14.4 oz (114.3 kg)    BMI (Calculated)  39.21    Single Leg Stand  0 seconds        Nutrition Therapy Plan and Nutrition Goals:   Nutrition Assessments: Nutrition Assessments - 04/22/20 1333      MEDFICTS Scores   Pre Score  27       MEDIFICTS Score Key:          ?70 Need to make dietary changes          40-70 Heart Healthy Diet         ? 40 Therapeutic Level Cholesterol Diet  Nutrition Goals Re-Evaluation:   Nutrition Goals Discharge (Final Nutrition Goals Re-Evaluation):   Psychosocial: Target Goals: Acknowledge presence or absence of significant depression and/or stress, maximize coping skills, provide positive support system. Participant is able to verbalize types and ability to use techniques and skills needed for reducing stress and depression.   Education: Depression - Provides group verbal and written instruction on the correlation between heart/lung disease and depressed mood, treatment options, and the stigmas associated with seeking treatment.   Education: Sleep Hygiene -Provides group verbal and written instruction about how sleep can affect your health.  Define sleep hygiene, discuss sleep cycles and impact of sleep habits. Review good sleep hygiene tips.    Education: Stress and Anxiety: - Provides group verbal and written instruction about the health risks of elevated stress and causes of high stress.  Discuss the correlation between heart/lung disease and anxiety and treatment options. Review healthy ways to manage with stress and anxiety.   Cardiac Rehab from 03/23/2017 in [facility] Cardiac and Pulmonary Rehab  Date  03/23/17  Educator  [facility]  Instruction Review Code (retired)  2- meets goals/outcomes      Initial Review & Psychosocial Screening: Initial Psych Review & Screening - 04/08/20 1414       Initial Review   Current issues with  Current Sleep Concerns    Source of Stress Concerns  Chronic Illness    Comments  Not able to sleep straight throught, but gets at least [place]?  Yes   husband     Barriers   Psychosocial barriers to participate in program  There are no identifiable barriers or psychosocial needs.;The patient should benefit from training in stress management and relaxation.      Screening Interventions   Interventions  Encouraged to exercise;To provide support and resources with identified psychosocial needs    Expected Outcomes  Short Term goal: Utilizing psychosocial counselor, staff and physician to assist with identification of specific Stressors or current issues interfering with healing process. Setting desired goal for each stressor or current issue identified.;Long Term Goal: Stressors or current issues are controlled or eliminated.;Short Term goal: Identification and review with participant of any Quality of Life or Depression concerns found by scoring the questionnaire.;Long Term goal: The participant improves quality of Life and PHQ9 Scores as seen by post scores and/or verbalization of changes       Quality of Life Scores:  Scores of 19 and below usually indicate a poorer quality of life in these areas.  A difference of  2-3 points is a clinically meaningful difference.  A difference of 2-3 points in the total score of the Quality of Life Index has been associated with significant improvement in overall quality of life, self-image, physical symptoms, and general health in studies assessing change in quality of life.  PHQ-9: Recent Review Flowsheet Data    Depression screen [facility] 2/9 04/22/2020 03/14/2017   Decreased Interest 0 0   Down, Depressed, Hopeless 0 0   PHQ - 2 Score 0 0   Altered sleeping 1 2    Tired, decreased energy 2 3    Change in appetite 0 0   Feeling bad or failure about yourself  0 0    Trouble concentrating 0 0   Moving slowly or fidgety/restless 0 0   Suicidal thoughts 0 0   PHQ-9 Score 3 5   Difficult doing work/chores Somewhat difficult Somewhat difficult     Interpretation of Total Score  Total Score Depression Severity:  1-4 = Minimal depression, 5-9 = Mild depression, 10-14 = Moderate depression, 15-19 = Moderately severe depression, 20-27 = Severe depression   Psychosocial Evaluation and Intervention: Psychosocial Evaluation - 04/08/20 1417      Psychosocial Evaluation & Interventions   Interventions  Encouraged to exercise with the program and follow exercise prescription    Comments  Zoua is returning to rehab for heart failure.  She has a great support system in her husband.  She has a lot of chronic issues that she has been dealing with continuously.  She was just discharge from hospital from a second stroke. Her strokes have left her with a visual and hearing impairment.  Overall is does well mentally.  She does not sleep well continuously but gets at least 6 hours total each day.  She is hoping to build back her strength and stamina and improve her endurance.  She wants to be able to move better overall.    Expected Outcomes  Short: Attend rehab regularly to build stamina  Long: Continue to be able to do more and feel better    Continue Psychosocial Services   Follow up required by staff       Psychosocial Re-Evaluation:   Psychosocial Discharge (Final Psychosocial Re-Evaluation):   Education: Education Goals: Education classes will be provided on a weekly basis, covering required  topics. Participant will state understanding/return demonstration of topics presented.  Learning Barriers/Preferences: Learning Barriers/Preferences - 04/08/20 1414      Learning Barriers/Preferences   Learning Barriers  Sight;Hearing   impaired vision from stroke, and left sided hearing impairment   Learning Preferences  Audio;Verbal Instruction       General  Pulmonary Education Topics:  Infection Prevention: - Provides verbal and written material to individual with discussion of infection control including proper hand washing and proper equipment cleaning during exercise session.   Pulmonary Rehab from 04/22/2020 in [facility] Cardiac and Pulmonary Rehab  Date  04/22/20  Educator  [facility]  Instruction Review Code  1- Verbalizes Understanding      Falls Prevention: - Provides verbal and written material to individual with discussion of falls prevention and safety.   Pulmonary Rehab from 04/22/2020 in [facility] Cardiac and Pulmonary Rehab  Date  04/22/20  Educator  [facility]  Instruction Review Code  1- Verbalizes Understanding      Chronic Lung Diseases: - Group verbal and written instruction to review updates, respiratory medications, advancements in procedures and treatments. Discuss use of supplemental oxygen including available portable oxygen systems, continuous and intermittent flow rates, concentrators, personal use and safety guidelines. Review proper use of inhaler and spacers. Provide informative websites for self-education.    Energy Conservation: - Provide group verbal and written instruction for methods to conserve energy, plan and organize activities. Instruct on pacing techniques, use of adaptive equipment and posture/positioning to relieve shortness of breath.   Triggers and Exacerbations: - Group verbal and written instruction to review types of environmental triggers and ways to prevent exacerbations. Discuss weather changes, air quality and the benefits of nasal washing. Review warning signs and symptoms to help prevent infections. Discuss techniques for effective airway clearance, coughing, and vibrations.   AED/CPR: - Group verbal and written instruction with the use of models to demonstrate the basic use of the AED with the basic ABC's of resuscitation.   Anatomy and Physiology of the Lungs: - Group verbal and written instruction with the  use of models to provide basic lung anatomy and physiology related to function, structure and complications of lung disease.   Anatomy & Physiology of the Heart: - Group verbal and written instruction and models provide basic cardiac anatomy and physiology, with the coronary electrical and arterial systems. Review of Valvular disease and Heart Failure   Cardiac Rehab from 03/23/2017 in [facility] Cardiac and Pulmonary Rehab  Date  03/21/17  Educator  [facility]  Instruction Review Code (retired)  2- meets goals/outcomes      Cardiac Medications: - Group verbal and written instruction to review commonly prescribed medications for heart disease. Reviews the medication, class of the drug, and side effects.   Other: -Provides group and verbal instruction on various topics (see comments)   Knowledge Questionnaire Score: Knowledge Questionnaire Score - 04/22/20 1333      Knowledge Questionnaire Score   Pre Score  16/18 Education Focus: O2 safety        Core Components/Risk Factors/Patient Goals at Admission: Personal Goals and Risk Factors at Admission - 04/22/20 1334      Core Components/Risk Factors/Patient Goals on Admission    Weight Management  Yes;Weight Loss;Obesity    Intervention  Weight Management: Develop a combined nutrition and exercise program designed to reach desired caloric intake, while maintaining appropriate intake of nutrient and fiber, sodium and fats, and appropriate energy expenditure required for the weight goal.;Weight Management: Provide education and appropriate resources to help  participant work on and attain dietary goals.;Weight Management/Obesity: Establish reasonable short term and long term weight goals.;Obesity: Provide education and appropriate resources to help participant work on and attain dietary goals.    Admit Weight  251 lb 14.4 oz (114.3 kg)    Goal Weight: Short Term  246 lb (111.6 kg)    Goal Weight: Long Term  240 lb (108.9 kg)    Expected Outcomes  Short  Term: Continue to assess and modify interventions until short term weight is achieved;Long Term: Adherence to nutrition and physical activity/exercise program aimed toward attainment of established weight goal;Weight Loss: Understanding of general recommendations for a balanced deficit meal plan, which promotes 1-2 lb weight loss per week and includes a negative energy balance of [phone] kcal/d;Understanding recommendations for meals to include 15-35% energy as protein, 25-35% energy from fat, 35-60% energy from carbohydrates, less than '200mg'$  of dietary cholesterol, 20-35 gm of total fiber daily;Understanding of distribution of calorie intake throughout the day with the consumption of 4-5 meals/snacks    Improve shortness of breath with ADL's  Yes    Intervention  Provide education, individualized exercise plan and daily activity instruction to help decrease symptoms of SOB with activities of daily living.    Expected Outcomes  Short Term: Improve cardiorespiratory fitness to achieve a reduction of symptoms when performing ADLs;Long Term: Be able to perform more ADLs without symptoms or delay the onset of symptoms    Diabetes  Yes    Intervention  Provide education about signs/symptoms and action to take for hypo/hyperglycemia.;Provide education about proper nutrition, including hydration, and aerobic/resistive exercise prescription along with prescribed medications to achieve blood glucose in normal ranges: Fasting glucose 65-99 mg/dL    Expected Outcomes  Short Term: Participant verbalizes understanding of the signs/symptoms and immediate care of hyper/hypoglycemia, proper foot care and importance of medication, aerobic/resistive exercise and nutrition plan for blood glucose control.;Long Term: Attainment of HbA1C < 7%.    Heart Failure  Yes    Intervention  Provide a combined exercise and nutrition program that is supplemented with education, support and counseling about heart failure. Directed toward  relieving symptoms such as shortness of breath, decreased exercise tolerance, and extremity edema.    Expected Outcomes  Improve functional capacity of life;Short term: Attendance in program 2-3 days a week with increased exercise capacity. Reported lower sodium intake. Reported increased fruit and vegetable intake. Reports medication compliance.;Long term: Adoption of self-care skills and reduction of barriers for early signs and symptoms recognition and intervention leading to self-care maintenance.;Short term: Daily weights obtained and reported for increase. Utilizing diuretic protocols set by physician.    Hypertension  Yes    Intervention  Provide education on lifestyle modifcations including regular physical activity/exercise, weight management, moderate sodium restriction and increased consumption of fresh fruit, vegetables, and low fat dairy, alcohol moderation, and smoking cessation.;Monitor prescription use compliance.    Expected Outcomes  Short Term: Continued assessment and intervention until BP is < 140/91m HG in hypertensive participants. < 130/876mHG in hypertensive participants with diabetes, heart failure or chronic kidney disease.;Long Term: Maintenance of blood pressure at goal levels.    Lipids  Yes    Intervention  Provide education and support for participant on nutrition & aerobic/resistive exercise along with prescribed medications to achieve LDL '70mg'$ , HDL >'40mg'$ .    Expected Outcomes  Short Term: Participant states understanding of desired cholesterol values and is compliant with medications prescribed. Participant is following exercise prescription and nutrition guidelines.;Long Term: Cholesterol controlled  with medications as prescribed, with individualized exercise RX and with personalized nutrition plan. Value goals: LDL < '70mg'$ , HDL > 40 mg.       Education:Diabetes - Individual verbal and written instruction to review signs/symptoms of diabetes, desired ranges of glucose  level fasting, after meals and with exercise. Acknowledge that pre and post exercise glucose checks will be done for 3 sessions at entry of program.   Pulmonary Rehab from 04/22/2020 in [facility]  Date  04/22/20  Educator  [facility]  Instruction Review Code  1- Verbalizes Understanding      Education: Know Your Numbers and Risk Factors: -Group verbal and written instruction about important numbers in your health.  Discussion of what are risk factors and how they play a role in the disease process.  Review of Cholesterol, Blood Pressure, Diabetes, and BMI and the role they play in your overall health.   Core Components/Risk Factors/Patient Goals Review:    Core Components/Risk Factors/Patient Goals at Discharge (Final Review):    ITP Comments: ITP Comments    Row Name 04/08/20 1426 04/22/20 1330 05/05/20 1045 05/07/20 0614     ITP Comments  Completed virtual orientation today.  EP evaluation is scheduled for Monday 4/26 at 3pm.  Documentation for diagnosis can be found in [facility] encounter 03/18/20.  Completed 6MWT and gym orientation.  Initial ITP created and sent for review to Dr. Emily Filbert, Medical Director.  Braylei left [place] a message yesterday.  Returned call today, left message.  She would like to delay her start in the program until June.  Her husband is still having to go back and forth to [facility] daily for treatments which makes it very hard for her to get here regularly since he is her means of transportation.  We will hold her start until June.  30 Day review completed. ITP review done, changes made as directed,and approval shown by signature of  Scientist, research (life sciences).       Comments:

## 2020-05-08 ENCOUNTER — Ambulatory Visit: Payer: Medicare Other

## 2020-05-09 ENCOUNTER — Ambulatory Visit: Payer: Medicare Other | Admitting: Family

## 2020-05-12 ENCOUNTER — Emergency Department: Payer: Medicare Other

## 2020-05-12 ENCOUNTER — Inpatient Hospital Stay
Admission: EM | Admit: 2020-05-12 | Discharge: 2020-05-15 | DRG: 683 | Disposition: A | Discharge status: Home / Self Care | Payer: Medicare Other | Attending: Internal Medicine | Admitting: Internal Medicine

## 2020-05-12 ENCOUNTER — Other Ambulatory Visit: Payer: Self-pay

## 2020-05-12 ENCOUNTER — Encounter: Payer: Self-pay | Admitting: Emergency Medicine

## 2020-05-12 ENCOUNTER — Observation Stay: Payer: Medicare Other

## 2020-05-12 DIAGNOSIS — I5032 Chronic diastolic (congestive) heart failure: Secondary | ICD-10-CM | POA: Diagnosis present

## 2020-05-12 DIAGNOSIS — I5033 Acute on chronic diastolic (congestive) heart failure: Secondary | ICD-10-CM | POA: Diagnosis present

## 2020-05-12 DIAGNOSIS — H548 Legal blindness, as defined in USA: Secondary | ICD-10-CM | POA: Diagnosis present

## 2020-05-12 DIAGNOSIS — I2510 Atherosclerotic heart disease of native coronary artery without angina pectoris: Secondary | ICD-10-CM | POA: Diagnosis present

## 2020-05-12 DIAGNOSIS — N390 Urinary tract infection, site not specified: Secondary | ICD-10-CM | POA: Diagnosis present

## 2020-05-12 DIAGNOSIS — E66813 Obesity, class 3: Secondary | ICD-10-CM

## 2020-05-12 DIAGNOSIS — E1129 Type 2 diabetes mellitus with other diabetic kidney complication: Secondary | ICD-10-CM

## 2020-05-12 DIAGNOSIS — E1122 Type 2 diabetes mellitus with diabetic chronic kidney disease: Secondary | ICD-10-CM | POA: Diagnosis present

## 2020-05-12 DIAGNOSIS — R001 Bradycardia, unspecified: Secondary | ICD-10-CM | POA: Diagnosis present

## 2020-05-12 DIAGNOSIS — I700 Atherosclerosis of aorta: Secondary | ICD-10-CM | POA: Diagnosis present

## 2020-05-12 DIAGNOSIS — E1140 Type 2 diabetes mellitus with diabetic neuropathy, unspecified: Secondary | ICD-10-CM | POA: Diagnosis present

## 2020-05-12 DIAGNOSIS — A498 Other bacterial infections of unspecified site: Secondary | ICD-10-CM | POA: Diagnosis present

## 2020-05-12 DIAGNOSIS — Z7902 Long term (current) use of antithrombotics/antiplatelets: Secondary | ICD-10-CM

## 2020-05-12 DIAGNOSIS — N179 Acute kidney failure, unspecified: Secondary | ICD-10-CM | POA: Diagnosis present

## 2020-05-12 DIAGNOSIS — Z8673 Personal history of transient ischemic attack (TIA), and cerebral infarction without residual deficits: Secondary | ICD-10-CM

## 2020-05-12 DIAGNOSIS — R1011 Right upper quadrant pain: Secondary | ICD-10-CM

## 2020-05-12 DIAGNOSIS — Z6841 Body Mass Index (BMI) 40.0 and over, adult: Secondary | ICD-10-CM

## 2020-05-12 DIAGNOSIS — B952 Enterococcus as the cause of diseases classified elsewhere: Secondary | ICD-10-CM | POA: Diagnosis present

## 2020-05-12 DIAGNOSIS — IMO0002 Reserved for concepts with insufficient information to code with codable children: Secondary | ICD-10-CM | POA: Diagnosis present

## 2020-05-12 DIAGNOSIS — Z20822 Contact with and (suspected) exposure to covid-19: Secondary | ICD-10-CM | POA: Diagnosis present

## 2020-05-12 DIAGNOSIS — R109 Unspecified abdominal pain: Secondary | ICD-10-CM | POA: Diagnosis not present

## 2020-05-12 DIAGNOSIS — N184 Chronic kidney disease, stage 4 (severe): Secondary | ICD-10-CM | POA: Diagnosis present

## 2020-05-12 DIAGNOSIS — K8020 Calculus of gallbladder without cholecystitis without obstruction: Secondary | ICD-10-CM | POA: Diagnosis present

## 2020-05-12 DIAGNOSIS — D631 Anemia in chronic kidney disease: Secondary | ICD-10-CM | POA: Diagnosis present

## 2020-05-12 DIAGNOSIS — I130 Hypertensive heart and chronic kidney disease with heart failure and stage 1 through stage 4 chronic kidney disease, or unspecified chronic kidney disease: Secondary | ICD-10-CM | POA: Diagnosis present

## 2020-05-12 DIAGNOSIS — M6282 Rhabdomyolysis: Secondary | ICD-10-CM | POA: Diagnosis present

## 2020-05-12 DIAGNOSIS — Z794 Long term (current) use of insulin: Secondary | ICD-10-CM

## 2020-05-12 LAB — COMPREHENSIVE METABOLIC PANEL
ALT: 63 U/L — ABNORMAL HIGH (ref 0–44)
AST: 37 U/L (ref 15–41)
Albumin: 3.9 g/dL (ref 3.5–5.0)
Alkaline Phosphatase: 65 U/L (ref 38–126)
Anion gap: 12 (ref 5–15)
BUN: 90 mg/dL — ABNORMAL HIGH (ref 6–20)
CO2: 16 mmol/L — ABNORMAL LOW (ref 22–32)
Calcium: 9.3 mg/dL (ref 8.9–10.3)
Chloride: 112 mmol/L — ABNORMAL HIGH (ref 98–111)
Creatinine, Ser: 4.26 mg/dL — ABNORMAL HIGH (ref 0.44–1.00)
GFR calc Af Amer: 12 mL/min — ABNORMAL LOW (ref >60)
GFR calc non Af Amer: 11 mL/min — ABNORMAL LOW (ref >60)
Glucose, Bld: 186 mg/dL — ABNORMAL HIGH (ref 70–99)
Potassium: 4.6 mmol/L (ref 3.5–5.1)
Sodium: 140 mmol/L (ref 135–145)
Total Bilirubin: 0.5 mg/dL (ref 0.3–1.2)
Total Protein: 7.7 g/dL (ref 6.5–8.1)

## 2020-05-12 LAB — URINALYSIS, COMPLETE (UACMP) WITH MICROSCOPIC
Bacteria, UA: NONE SEEN
Bilirubin Urine: NEGATIVE
Glucose, UA: 150 mg/dL — AB
Ketones, ur: NEGATIVE mg/dL
Leukocytes,Ua: NEGATIVE
Nitrite: NEGATIVE
Protein, ur: 100 mg/dL — AB
Specific Gravity, Urine: 1.011 (ref 1.005–1.030)
pH: 5 (ref 5.0–8.0)

## 2020-05-12 LAB — CBC
HCT: 28.4 % — ABNORMAL LOW (ref 36.0–46.0)
Hemoglobin: 9.6 g/dL — ABNORMAL LOW (ref 12.0–15.0)
MCH: 28.6 pg (ref 26.0–34.0)
MCHC: 33.8 g/dL (ref 30.0–36.0)
MCV: 84.5 fL (ref 80.0–100.0)
Platelets: 286 10*3/uL (ref 150–400)
RBC: 3.36 MIL/uL — ABNORMAL LOW (ref 3.87–5.11)
RDW: 14.9 % (ref 11.5–15.5)
WBC: 10.6 10*3/uL — ABNORMAL HIGH (ref 4.0–10.5)
nRBC: 0 % (ref 0.0–0.2)

## 2020-05-12 LAB — LIPASE, BLOOD: Lipase: 39 U/L (ref 11–51)

## 2020-05-12 LAB — SARS CORONAVIRUS 2 BY RT PCR (HOSPITAL ORDER, PERFORMED IN ~~LOC~~ HOSPITAL LAB): SARS Coronavirus 2: NEGATIVE

## 2020-05-12 LAB — GLUCOSE, CAPILLARY: Glucose-Capillary: 123 mg/dL — ABNORMAL HIGH (ref 70–99)

## 2020-05-12 MED ORDER — ROSUVASTATIN CALCIUM 20 MG PO TABS
20.0000 mg | ORAL_TABLET | Freq: Every day | ORAL | Status: DC
  Administered 2020-05-13: 20 mg via ORAL
  Filled 2020-05-12 (×3): qty 1

## 2020-05-12 MED ORDER — SODIUM CHLORIDE 0.9 % IV SOLN: 2.0000 g | Freq: Once | INTRAVENOUS | Status: DC

## 2020-05-12 MED ORDER — INSULIN ASPART 100 UNIT/ML ~~LOC~~ SOLN
0.0000 [IU] | Freq: Every day | SUBCUTANEOUS | Status: DC
  Administered 2020-05-13: 2 [IU] via SUBCUTANEOUS
  Filled 2020-05-12: qty 1

## 2020-05-12 MED ORDER — CHOLECALCIFEROL 10 MCG (400 UNIT) PO TABS
400.0000 [IU] | ORAL_TABLET | Freq: Every day | ORAL | Status: DC
  Filled 2020-05-12: qty 1

## 2020-05-12 MED ORDER — ALLOPURINOL 300 MG PO TABS
300.0000 mg | ORAL_TABLET | Freq: Every day | ORAL | Status: DC
  Administered 2020-05-13 – 2020-05-15 (×3): 300 mg via ORAL
  Filled 2020-05-12 (×3): qty 1

## 2020-05-12 MED ORDER — METOPROLOL TARTRATE 50 MG PO TABS
100.0000 mg | ORAL_TABLET | Freq: Two times a day (BID) | ORAL | Status: DC
  Administered 2020-05-13: 100 mg via ORAL
  Filled 2020-05-12 (×3): qty 2

## 2020-05-12 MED ORDER — ACETAMINOPHEN 650 MG RE SUPP: 650.0000 mg | Freq: Four times a day (QID) | RECTAL | Status: DC | PRN

## 2020-05-12 MED ORDER — CLOPIDOGREL BISULFATE 75 MG PO TABS
75.0000 mg | ORAL_TABLET | Freq: Every day | ORAL | Status: DC
  Administered 2020-05-13 – 2020-05-15 (×3): 75 mg via ORAL
  Filled 2020-05-12 (×3): qty 1

## 2020-05-12 MED ORDER — INSULIN ASPART 100 UNIT/ML ~~LOC~~ SOLN
0.0000 [IU] | Freq: Three times a day (TID) | SUBCUTANEOUS | Status: DC
  Administered 2020-05-13 – 2020-05-15 (×7): 4 [IU] via SUBCUTANEOUS
  Filled 2020-05-12 (×7): qty 1

## 2020-05-12 MED ORDER — HYDROCODONE-ACETAMINOPHEN 5-325 MG PO TABS: 1.0000 | ORAL_TABLET | ORAL | Status: DC | PRN

## 2020-05-12 MED ORDER — SODIUM CHLORIDE 0.9% FLUSH: 3.0000 mL | Freq: Once | INTRAVENOUS | Status: DC

## 2020-05-12 MED ORDER — ACETAMINOPHEN 325 MG PO TABS
650.0000 mg | ORAL_TABLET | Freq: Four times a day (QID) | ORAL | Status: DC | PRN
  Administered 2020-05-13 (×2): 650 mg via ORAL
  Filled 2020-05-12 (×2): qty 2

## 2020-05-12 MED ORDER — LOSARTAN POTASSIUM 50 MG PO TABS
100.0000 mg | ORAL_TABLET | Freq: Every day | ORAL | Status: DC
  Filled 2020-05-12 (×3): qty 2

## 2020-05-12 MED ORDER — FUROSEMIDE 20 MG PO TABS
20.0000 mg | ORAL_TABLET | Freq: Every day | ORAL | Status: DC
  Filled 2020-05-12 (×2): qty 1

## 2020-05-12 MED ORDER — ISOSORBIDE MONONITRATE ER 30 MG PO TB24
30.0000 mg | ORAL_TABLET | Freq: Every day | ORAL | Status: DC
  Administered 2020-05-13 – 2020-05-15 (×3): 30 mg via ORAL
  Filled 2020-05-12 (×3): qty 1

## 2020-05-12 MED ORDER — SODIUM CHLORIDE 0.9 % IV SOLN
2.0000 g | INTRAVENOUS | Status: DC
  Administered 2020-05-12 – 2020-05-13 (×2): 2 g via INTRAVENOUS
  Filled 2020-05-12 (×2): qty 20
  Filled 2020-05-12: qty 2

## 2020-05-12 MED ORDER — AMLODIPINE BESYLATE 10 MG PO TABS
10.0000 mg | ORAL_TABLET | Freq: Every day | ORAL | Status: DC
  Administered 2020-05-12 – 2020-05-14 (×3): 10 mg via ORAL
  Filled 2020-05-12 (×4): qty 1

## 2020-05-12 MED ORDER — MORPHINE SULFATE (PF) 2 MG/ML IV SOLN: 2.0000 mg | INTRAVENOUS | Status: DC | PRN

## 2020-05-12 MED ORDER — ENOXAPARIN SODIUM 40 MG/0.4ML ~~LOC~~ SOLN
40.0000 mg | SUBCUTANEOUS | Status: DC
  Administered 2020-05-12: 40 mg via SUBCUTANEOUS
  Filled 2020-05-12: qty 0.4

## 2020-05-12 MED ORDER — SODIUM CHLORIDE 0.9 % IV BOLUS
1000.0000 mL | Freq: Once | INTRAVENOUS | Status: AC
  Administered 2020-05-12: 1000 mL via INTRAVENOUS

## 2020-05-12 MED ORDER — SODIUM CHLORIDE 0.9 % IV SOLN: INTRAVENOUS | Status: DC

## 2020-05-12 MED ORDER — ASPIRIN 81 MG PO TBEC
81.0000 mg | DELAYED_RELEASE_TABLET | ORAL | Status: DC
  Administered 2020-05-13 – 2020-05-15 (×3): 81 mg via ORAL
  Filled 2020-05-12 (×4): qty 1

## 2020-05-12 MED ORDER — INSULIN GLARGINE 100 UNIT/ML ~~LOC~~ SOLN
40.0000 [IU] | Freq: Every day | SUBCUTANEOUS | Status: DC
  Administered 2020-05-12 – 2020-05-14 (×3): 40 [IU] via SUBCUTANEOUS
  Filled 2020-05-12 (×4): qty 0.4

## 2020-05-12 NOTE — ED Provider Notes (Signed)
[facility] Emergency Department Provider Note  ____________________________________________   I have reviewed the triage vital signs and the nursing notes.   HISTORY  Chief Complaint Abdominal Pain and Back Pain   History limited by: Not Limited   HPI Sharon Arias is a 59 y.o. female who presents to the emergency department today because of concern for elevated creatinine and right sided flank and back pain.  The patient states that her pain started a couple of days ago.  Started gradually and then became worse.  It is located in the right flank and wraps around to the right upper abdomen.  She does have some discomfort going down towards her groin.  She says that it was accompanied by nausea today.  The patient has noticed increased frequency of urination.  Patient denies any burning urination.  She denies any fevers.  She did see her doctor on Friday and had blood work done which showed elevated creatinine level.  Patient states she does have some baseline kidney disease.  Records reviewed. Per medical record review patient has a history of CAD, CHF.   Past Medical History:  Diagnosis Date  . Anemia in chronic kidney disease 05/19/2016  . CAD (coronary artery disease)   . CHF (congestive heart failure) ([place])   . Chicken pox   . Chronic kidney disease    per dr sparks  . CVA (cerebral vascular accident) ([place])   . Detached retina   . Diabetes mellitus without complication ([place])    type 2  . Diabetic neuropathy ([place])   . Diabetic retinopathy ([place])    legally blind  . Hyperlipidemia   . Hypertension   . Myocardial infarction ([place])   . Obesity   . PONV (postoperative nausea and vomiting)   . Sciatica of right side    going to see physiciatry    Patient Active Problem List   Diagnosis Date Noted  . AKI (acute kidney injury) ([place]) 03/21/2020  . Right pontine stroke ([place]) 03/18/2020  . Gout 03/18/2020  . Leukocytosis 03/18/2020  . Left sided  numbness 03/18/2020  . Acute CHF (congestive heart failure) ([place]) 03/18/2020  . Acute CVA (cerebrovascular accident) ([place]) 07/21/2019  . Necrotizing soft tissue infection 12/11/2018  . CAP (community acquired pneumonia) 03/18/2018  . Hypocalcemia 03/18/2018  . Hypokalemia 03/18/2018  . CKD (chronic kidney disease), stage IV ([place]) 09/29/2016  . Chronic arthritis 09/29/2016  . Anemia in chronic kidney disease 05/19/2016  . DDD (degenerative disc disease), lumbar 01/13/2016  . Well controlled type 2 diabetes mellitus ([place]) 10/01/2015  . Proliferative diabetic retinopathy with macular edema associated with type 2 diabetes mellitus ([place]) 11/13/2014  . Coronary artery disease 08/17/2012  . Obesity 08/16/2012  . Myocardial infarction ([place]) 08/16/2012  . Hyperlipidemia, unspecified 08/16/2012  . HTN (hypertension) 08/16/2012    Past Surgical History:  Procedure Laterality Date  . COLONOSCOPY WITH PROPOFOL N/A 01/26/2016   Procedure: COLONOSCOPY WITH PROPOFOL;  Surgeon: Lollie Sails, MD;  Location: [facility] ENDOSCOPY;  Service: Endoscopy;  Laterality: N/A;  . COLONOSCOPY WITH PROPOFOL N/A 01/27/2016   Procedure: COLONOSCOPY WITH PROPOFOL;  Surgeon: Lollie Sails, MD;  Location: [facility] ENDOSCOPY;  Service: Endoscopy;  Laterality: N/A;  . CORONARY ARTERY BYPASS GRAFT  2013  . INCISION AND DRAINAGE     chest abscess  . INCISION AND DRAINAGE ABSCESS N/A 12/11/2018   Procedure: INCISION AND DRAINAGE PERINEAL;  Surgeon: Jules Husbands, MD;  Location: [facility] ORS;  Service: General;  Laterality: N/A;  .  RETINAL LASER PROCEDURE      Prior to Admission medications   Medication Sig Start Date End Date Taking? Authorizing Provider  acetaminophen (TYLENOL) 500 MG tablet Take 1,000 mg by mouth every 8 (eight) hours as needed for pain. 12/18/18   [provider]  allopurinol (ZYLOPRIM) 100 MG tablet Take 200 mg by mouth at bedtime. 05/15/19   [provider]  amLODipine (NORVASC) 10 MG  tablet Take 10 mg by mouth at bedtime. 12/04/18   [provider]  aspirin EC 81 MG tablet Take 81 mg by mouth every morning.    [provider]  Cholecalciferol 10 MCG (400 UNIT) CAPS Take 10 mcg by mouth daily.    [provider]  clopidogrel (PLAVIX) 75 MG tablet Take 1 tablet (75 mg total) by mouth daily. 07/23/19   Vaughan Basta, MD  furosemide (LASIX) 20 MG tablet Take 20 mg by mouth daily.    [provider]  Insulin Glargine (LANTUS SOLOSTAR) 100 UNIT/ML Solostar Pen Inject 80 Units into the skin at bedtime. 10/07/18   [provider]  insulin lispro (HUMALOG) 100 UNIT/ML KwikPen Inject 15-30 Units into the skin See admin instructions. Pt. Uses 15 un/ml in the mornings, 25un/ml in the afternoon, and 30 un/ml at bedtime 10/02/18   [provider]  isosorbide mononitrate (IMDUR) 30 MG 24 hr tablet Take 30 mg by mouth daily. 02/13/20   [provider]  losartan (COZAAR) 100 MG tablet Take 100 mg by mouth daily. 09/27/18   [provider]  metoprolol tartrate (LOPRESSOR) 100 MG tablet Take 100 mg by mouth 2 (two) times a day.  11/14/18   [provider]  rosuvastatin (CRESTOR) 20 MG tablet Take 20 mg by mouth at bedtime.  03/13/20   [provider]    Allergies Ozempic (0.25 or 0.5 mg-dose) [semaglutide(0.25 or 0.5mg -dos)] and Trulicity [dulaglutide]  Family History  Problem Relation Age of Onset  . Breast cancer Mother   . Prostate cancer Father        we think mets to liver and bone    Social History Social History   Tobacco Use  . Smoking status: Never Smoker  . Smokeless tobacco: Never Used  Substance Use Topics  . Alcohol use: No  . Drug use: No    Review of Systems Constitutional: No fever/chills Eyes: No visual changes. ENT: No sore throat. Cardiovascular: Denies chest pain. Respiratory: Denies shortness of breath. Gastrointestinal: No abdominal pain.  No nausea, no  vomiting.  No diarrhea.   Genitourinary: Positive for increased frequency of urination. Musculoskeletal: Positive for right flank pain Skin: Negative for rash. Neurological: Negative for headaches, focal weakness or numbness.  ____________________________________________   PHYSICAL EXAM:  VITAL SIGNS: ED Triage Vitals  Enc Vitals Group     BP 05/12/20 1341 (!) 157/55     Pulse Rate 05/12/20 1341 (!) 57     Resp 05/12/20 1341 16     Temp 05/12/20 1341 98.1 F (36.7 C)     Temp Source 05/12/20 1341 Oral     SpO2 05/12/20 1341 99 %     Weight 05/12/20 1339 (!) 318 lb 2 oz (144.3 kg)     Height 05/12/20 1339 5' 7.2" (1.707 m)     Head Circumference --      Peak Flow --      Pain Score 05/12/20 1338 10   Constitutional: Alert and oriented.  Eyes: Conjunctivae are normal.  ENT  Head: Normocephalic and atraumatic.      Nose: No congestion/rhinnorhea.      Mouth/Throat: Mucous membranes are moist.      Neck: No stridor. Hematological/Lymphatic/Immunilogical: No cervical lymphadenopathy. Cardiovascular: Normal rate, regular rhythm.  No murmurs, rubs, or gallops.  Respiratory: Normal respiratory effort without tachypnea nor retractions. Breath sounds are clear and equal bilaterally. No wheezes/rales/rhonchi. Gastrointestinal: Soft and slightly tender in the right abdomen. Positive CVA tenderness.  Genitourinary: Deferred Musculoskeletal: Normal range of motion in all extremities. No lower extremity edema. Neurologic:  Normal speech and language. No gross focal neurologic deficits are appreciated.  Skin:  Skin is warm, dry and intact. No rash noted. Psychiatric: Mood and affect are normal. Speech and behavior are normal. Patient exhibits appropriate insight and judgment.  ____________________________________________    LABS (pertinent positives/negatives)  CMP na 140, k 4.6, glu 186, cr 4.26 CBC wbc 10.6, hgb 9.6, plt 286 UA hazy, large hgb dipstick, protein 100, 0-5 rbc,  6-10 wbc  ____________________________________________   EKG  None  ____________________________________________    RADIOLOGY  CT renal Gallstones without evidence of cholecystitis. No kidney stone  ____________________________________________   PROCEDURES  Procedures  ____________________________________________   INITIAL IMPRESSION / ASSESSMENT AND PLAN / ED COURSE  Pertinent labs & imaging results that were available during my care of the patient were reviewed by me and considered in my medical decision making (see chart for details).   Patient presented to the emergency department today because of concerns for right sided back and abdominal pain as well as worsening kidney function. Blood work today is consistent with AKI. Did order IVFs. Additionally obtained CT scan to evaluate for obstructive uropathy, this was negative for ureteral stone. Positive for gallstone but no evidence of cholecystitis. Will start antibiotics for possible urinary tract infection. Will plan on admission.   ___________________________________________   FINAL CLINICAL IMPRESSION(S) / ED DIAGNOSES  Final diagnoses:  Lower urinary tract infectious disease  AKI (acute kidney injury) ([place])     Note: This dictation was prepared with Dragon dictation. Any transcriptional errors that result from this process are unintentional     Nance Pear, MD 05/12/20 [phone]

## 2020-05-12 NOTE — ED Notes (Signed)
Iv started.  Fluid infusing.  Pt alert

## 2020-05-12 NOTE — ED Notes (Signed)
Pt reports right upper abd pain and back pain for 3 days.  Intermittent nausea.  No v/d.  Hx kidney disease.  Pt also reports urinary frequency.  No injury to back.  Pt alert  Speech clear.

## 2020-05-12 NOTE — H&P (Signed)
History and Physical    Sharon Arias ION:[id number] DOB: [birth year] DOA: 05/12/2020  PCP: Idelle Crouch, MD   Patient coming from: Home  I have personally briefly reviewed patient's old medical records in [place]  Chief Complaint: Right flank pain  HPI: Sharon Arias is a 59 y.o. female with medical history significant for DM, CKD 4, anemia of CKD 4, CAD, obesity who presents to the emergency room with a 3 to 4-day history of right-sided flank pain radiating from the right mid back around to the right side and down to the groin area.  Pain is achy and crampy and was initially of low intensity but has been increasing in intensity for the past 3 days.  It is associated with urinary urgency and mild discomfort with urination.  She denies fever, nausea vomiting or change in bowel habits.  Denies cough or shortness of breath  ED Course: Vitals in the emergency room were mostly unremarkable.  Work-up significant for creatinine of 4.26, above baseline of 2.74 for his stage IV kidney disease.  She had mild leukocytosis of 10,600.  Hemoglobin 9.6 which is about her baseline.  Urinalysis was not strongly consistent with UTI and showed negative leukocyte esterase and negative nitrite though was hazy in appearance.  CT renal stone study showed no urinary tract calculi or obstructive uropathy.  There was cholelithiasis without evidence of acute cholecystitis.  Patient was started on IV Rocephin for suspicion of UTI.  Hospitalist consulted for admission.  Review  Review of Systems: As per HPI otherwise 10 point review of systems negative.    Past Medical History:  Diagnosis Date  . Anemia in chronic kidney disease 05/19/2016  . CAD (coronary artery disease)   . CHF (congestive heart failure) ([place])   . Chicken pox   . Chronic kidney disease    per dr sparks  . CVA (cerebral vascular accident) ([place])   . Detached retina   . Diabetes mellitus without complication ([place])    type 2  .  Diabetic neuropathy ([place])   . Diabetic retinopathy ([place])    legally blind  . Hyperlipidemia   . Hypertension   . Myocardial infarction ([place])   . Obesity   . PONV (postoperative nausea and vomiting)   . Sciatica of right side    going to see physiciatry    Past Surgical History:  Procedure Laterality Date  . COLONOSCOPY WITH PROPOFOL N/A 01/26/2016   Procedure: COLONOSCOPY WITH PROPOFOL;  Surgeon: Lollie Sails, MD;  Location: [facility] ENDOSCOPY;  Service: Endoscopy;  Laterality: N/A;  . COLONOSCOPY WITH PROPOFOL N/A 01/27/2016   Procedure: COLONOSCOPY WITH PROPOFOL;  Surgeon: Lollie Sails, MD;  Location: [facility] ENDOSCOPY;  Service: Endoscopy;  Laterality: N/A;  . CORONARY ARTERY BYPASS GRAFT  2013  . INCISION AND DRAINAGE     chest abscess  . INCISION AND DRAINAGE ABSCESS N/A 12/11/2018   Procedure: INCISION AND DRAINAGE PERINEAL;  Surgeon: Jules Husbands, MD;  Location: [facility] ORS;  Service: General;  Laterality: N/A;  . RETINAL LASER PROCEDURE       reports that she has never smoked. She has never used smokeless tobacco. She reports that she does not drink alcohol or use drugs.  Allergies  Allergen Reactions  . Ozempic (0.25 Or 0.5 Mg-Dose) [Semaglutide(0.25 Or 0.5mg -Dos)] Diarrhea and Nausea Only  . Trulicity [Dulaglutide] Diarrhea and Nausea Only    Family History  Problem Relation Age of Onset  . Breast cancer Mother   .  Prostate cancer Father        we think mets to liver and bone     Prior to Admission medications   Medication Sig Start Date End Date Taking? Authorizing Provider  acetaminophen (TYLENOL) 500 MG tablet Take 1,000 mg by mouth every 8 (eight) hours as needed for pain. 12/18/18   [provider]  allopurinol (ZYLOPRIM) 100 MG tablet Take 200 mg by mouth at bedtime. 05/15/19   [provider]  amLODipine (NORVASC) 10 MG tablet Take 10 mg by mouth at bedtime. 12/04/18   [provider]  aspirin EC 81 MG tablet Take 81 mg by mouth  every morning.    [provider]  Cholecalciferol 10 MCG (400 UNIT) CAPS Take 10 mcg by mouth daily.    [provider]  clopidogrel (PLAVIX) 75 MG tablet Take 1 tablet (75 mg total) by mouth daily. 07/23/19   Vaughan Basta, MD  furosemide (LASIX) 20 MG tablet Take 20 mg by mouth daily.    [provider]  Insulin Glargine (LANTUS SOLOSTAR) 100 UNIT/ML Solostar Pen Inject 80 Units into the skin at bedtime. 10/07/18   [provider]  insulin lispro (HUMALOG) 100 UNIT/ML KwikPen Inject 15-30 Units into the skin See admin instructions. Pt. Uses 15 un/ml in the mornings, 25un/ml in the afternoon, and 30 un/ml at bedtime 10/02/18   [provider]  isosorbide mononitrate (IMDUR) 30 MG 24 hr tablet Take 30 mg by mouth daily. 02/13/20   [provider]  losartan (COZAAR) 100 MG tablet Take 100 mg by mouth daily. 09/27/18   [provider]  metoprolol tartrate (LOPRESSOR) 100 MG tablet Take 100 mg by mouth 2 (two) times a day.  11/14/18   [provider]  rosuvastatin (CRESTOR) 20 MG tablet Take 20 mg by mouth at bedtime.  03/13/20   [provider]    Physical Exam: Vitals:   05/12/20 1600 05/12/20 1630 05/12/20 1700 05/12/20 1730  BP: (!) 147/50 (!) 141/57 (!) 148/64 (!) 126/91  Pulse: (!) 56 (!) 55 (!) 54   Resp:      Temp:      TempSrc:      SpO2: 100% 100% 100%   Weight:      Height:         Vitals:   05/12/20 1600 05/12/20 1630 05/12/20 1700 05/12/20 1730  BP: (!) 147/50 (!) 141/57 (!) 148/64 (!) 126/91  Pulse: (!) 56 (!) 55 (!) 54   Resp:      Temp:      TempSrc:      SpO2: 100% 100% 100%   Weight:      Height:        Constitutional: Alert and awake, oriented x3, not in any acute distress. Eyes: PERLA, EOMI, irises appear normal, anicteric sclera,  ENMT: external ears and nose appear normal, normal hearing             Lips appears normal, oropharynx mucosa, tongue, posterior pharynx appear  normal  Neck: neck appears normal, no masses, normal ROM, no thyromegaly, no JVD  CVS: S1-S2 clear, no murmur rubs or gallops,  , no carotid bruits, pedal pulses palpable, No LE edema Respiratory:  clear to auscultation bilaterally, no wheezing, rales or rhonchi. Respiratory effort normal. No accessory muscle use.  Abdomen: Right upper quadrant tenderness, nondistended, normal bowel sounds, no hepatosplenomegaly, no hernias Musculoskeletal: : no cyanosis, clubbing , no contractures or atrophy Neuro: Cranial nerves II-XII intact, sensation, reflexes normal,  strength Psych: judgement and insight appear normal, stable mood and affect,  Skin: no rashes or lesions or ulcers, no induration or nodules   Labs on Admission: I have personally reviewed following labs and imaging studies  CBC: Recent Labs  Lab 05/12/20 1343  WBC 10.6*  HGB 9.6*  HCT 28.4*  MCV 84.5  PLT 086   Basic Metabolic Panel: Recent Labs  Lab 05/12/20 1343  NA 140  K 4.6  CL 112*  CO2 16*  GLUCOSE 186*  BUN 90*  CREATININE 4.26*  CALCIUM 9.3   GFR: Estimated Creatinine Clearance: 21.6 mL/min (A) (by C-G formula based on SCr of 4.26 mg/dL (H)). Liver Function Tests: Recent Labs  Lab 05/12/20 1343  AST 37  ALT 63*  ALKPHOS 65  BILITOT 0.5  PROT 7.7  ALBUMIN 3.9   Recent Labs  Lab 05/12/20 1343  LIPASE 39   No results for input(s): AMMONIA in the last 168 hours. Coagulation Profile: No results for input(s): INR, PROTIME in the last 168 hours. Cardiac Enzymes: No results for input(s): CKTOTAL, CKMB, CKMBINDEX, TROPONINI in the last 168 hours. BNP (last 3 results) No results for input(s): PROBNP in the last 8760 hours. HbA1C: No results for input(s): HGBA1C in the last 72 hours. CBG: No results for input(s): GLUCAP in the last 168 hours. Lipid Profile: No results for input(s): CHOL, HDL, LDLCALC, TRIG, CHOLHDL, LDLDIRECT in the last 72 hours. Thyroid Function Tests: No results for input(s):  TSH, T4TOTAL, FREET4, T3FREE, THYROIDAB in the last 72 hours. Anemia Panel: No results for input(s): VITAMINB12, FOLATE, FERRITIN, TIBC, IRON, RETICCTPCT in the last 72 hours. Urine analysis:    Component Value Date/Time   COLORURINE STRAW (A) 05/12/2020 1409   APPEARANCEUR HAZY (A) 05/12/2020 1409   LABSPEC 1.011 05/12/2020 1409   PHURINE 5.0 05/12/2020 1409   GLUCOSEU 150 (A) 05/12/2020 1409   HGBUR LARGE (A) 05/12/2020 1409   BILIRUBINUR NEGATIVE 05/12/2020 1409   KETONESUR NEGATIVE 05/12/2020 1409   PROTEINUR 100 (A) 05/12/2020 1409   NITRITE NEGATIVE 05/12/2020 1409   LEUKOCYTESUR NEGATIVE 05/12/2020 1409    Radiological Exams on Admission: CT Renal Stone Study  Result Date: 05/12/2020 CLINICAL DATA:  Lower back and lower abdominal pain, urinary urgency and frequency, intermittent nausea EXAM: CT ABDOMEN AND PELVIS WITHOUT CONTRAST TECHNIQUE: Multidetector CT imaging of the abdomen and pelvis was performed following the standard protocol without IV contrast. COMPARISON:  12/11/2018 FINDINGS: Lower chest: No acute pleural or parenchymal lung disease. Hepatobiliary: Numerous calcified gallstones are again identified, with no evidence of acute cholecystitis. Enhanced imaging of the liver is unremarkable. Pancreas: Unremarkable. No pancreatic ductal dilatation or surrounding inflammatory changes. Spleen: Normal in size without focal abnormality. Adrenals/Urinary Tract: No urinary tract calculi or obstructive uropathy within either kidney. Bilateral renal cortical atrophy unchanged. The adrenals are unremarkable. Bladder is minimally distended with no focal abnormality. Stomach/Bowel: No bowel obstruction or ileus. Normal appendix right lower quadrant. No bowel wall thickening or inflammatory change. Vascular/Lymphatic: There is severe atherosclerosis of the aorta and its distal branches. No pathologic adenopathy. Reproductive: Uterus and bilateral adnexa are unremarkable. Other: No abdominal  wall hernia or abnormality. No abdominopelvic ascites. Musculoskeletal: No acute or destructive bony lesions. Reconstructed images demonstrate no additional findings. IMPRESSION: 1. No urinary tract calculi or obstructive uropathy. 2. Cholelithiasis without evidence of acute cholecystitis. 3. Aortic Atherosclerosis (ICD10-I70.0). Electronically Signed   By: Randa Ngo M.D.   On: 05/12/2020 18:36    EKG: Independently reviewed.   Assessment/Plan  Principal Problem:   AKI (acute kidney injury) ([place])   CKD (chronic kidney disease), stage IV (HCC) -Creatinine at 4.26 up from 2.74 and no evidence of obstructive uropathy -IV hydration -Nephrology consult  Urinary tract infection -No strong evidence of UTI on urinalysis but patient does have CVA tenderness and dysuria -Continue Rocephin --urine culture   Right flank pain -Suspect GU etiology but differential includes biliary tract disease -CT renal stone study showing no urinary calculi but does show cholelithiasis but without evidence of acute cholecystitis -Given urinalysis that is not strongly positive, will get right upper quadrant sonogram to evaluate gallbladder  Cholelithiasis without cholecystitis -Patient presents with right flank pain, gallstones -Follow-up right upper quadrant sonogram(addendum: RUQ sonogram shows cholelithiasis with sludge but no acute cholecystitis)    Anemia in chronic kidney disease -Hemoglobin at 9.6 which is baseline -Continue to monitor    Coronary artery disease -Continue clopidogrel, aspirin, Imdur, metoprolol and rosuvastatin    Type 2 diabetes mellitus with renal complication (HCC) -Continue home Lantus but at reduced dosage -Sliding scale insulin coverage    Obesity, Class III, BMI 40-49.9 (morbid obesity) ([place]) -This complicates overall prognosis and care     DVT prophylaxis: Lovenox  Code Status: full code  Family Communication:  none  Disposition Plan: Back to previous home  environment Consults called: renal , Dr. Candiss Norse Status:obs    Athena Masse MD [facility]     05/12/2020, 7:08 PM

## 2020-05-12 NOTE — ED Notes (Signed)
Pt reports taking 300 mg Allopurinol in the AM and 200 mg in the PM

## 2020-05-12 NOTE — ED Notes (Signed)
ED TO INPATIENT HANDOFF REPORT  ED Nurse Name and Phone #: Olen Cordial 61  S Name/Age/Gender Sharon Arias 59 y.o. female Room/Bed: ED08A/ED08A  Code Status   Code Status: Full Code  Home/SNF/Other Home Patient oriented to: self, place, time and situation Is this baseline? Yes   Triage Complete: Triage complete  Chief Complaint UTI (urinary tract infection) [N39.0]  Triage Note C/O "kidney's not working".  Friday creatnine was elevated.  Also c/o lower back and lower abdominal pain.  C/O urinary urgency and frequency.    Allergies Allergies  Allergen Reactions  . Ozempic (0.25 Or 0.5 Mg-Dose) [Semaglutide(0.25 Or 0.5mg -Dos)] Diarrhea and Nausea Only  . Trulicity [Dulaglutide] Diarrhea and Nausea Only    Level of Care/Admitting Diagnosis ED Disposition    ED Disposition Condition [facility] Area: [place] [[id number]]  Level of Care: Med-Surg [16]  Covid Evaluation: Asymptomatic Screening Protocol (No Symptoms)  Diagnosis: UTI (urinary tract infection) [115726]  Admitting Physician: Athena Masse [[id number]]  Attending Physician: Athena Masse [[id number]]       B Medical/Surgery History Past Medical History:  Diagnosis Date  . Anemia in chronic kidney disease 05/19/2016  . CAD (coronary artery disease)   . CHF (congestive heart failure) ([place])   . Chicken pox   . Chronic kidney disease    per dr sparks  . CVA (cerebral vascular accident) ([place])   . Detached retina   . Diabetes mellitus without complication ([place])    type 2  . Diabetic neuropathy ([place])   . Diabetic retinopathy ([place])    legally blind  . Hyperlipidemia   . Hypertension   . Myocardial infarction ([place])   . Obesity   . PONV (postoperative nausea and vomiting)   . Sciatica of right side    going to see physiciatry   Past Surgical History:  Procedure Laterality Date  . COLONOSCOPY WITH PROPOFOL N/A 01/26/2016   Procedure: COLONOSCOPY WITH PROPOFOL;  Surgeon:  Lollie Sails, MD;  Location: [facility] ENDOSCOPY;  Service: Endoscopy;  Laterality: N/A;  . COLONOSCOPY WITH PROPOFOL N/A 01/27/2016   Procedure: COLONOSCOPY WITH PROPOFOL;  Surgeon: Lollie Sails, MD;  Location: [facility] ENDOSCOPY;  Service: Endoscopy;  Laterality: N/A;  . CORONARY ARTERY BYPASS GRAFT  2013  . INCISION AND DRAINAGE     chest abscess  . INCISION AND DRAINAGE ABSCESS N/A 12/11/2018   Procedure: INCISION AND DRAINAGE PERINEAL;  Surgeon: Jules Husbands, MD;  Location: [facility] ORS;  Service: General;  Laterality: N/A;  . RETINAL LASER PROCEDURE       A IV Location/Drains/Wounds Patient Lines/Drains/Airways Status   Active Line/Drains/Airways    Name:   Placement date:   Placement time:   Site:   Days:   Peripheral IV 05/12/20 Left Antecubital   05/12/20    1740    Antecubital   less than 1   Incision (Closed) 12/11/18 Perineum   12/11/18    1757     518          Intake/Output Last 24 hours No intake or output data in the 24 hours ending 05/12/20 2223  Labs/Imaging Results for orders placed or performed during the hospital encounter of 05/12/20 (from the past 48 hour(s))  Lipase, blood     Status: None   Collection Time: 05/12/20  1:43 PM  Result Value Ref Range   Lipase 39 11 - 51 U/L    Comment: Performed at [facility], [address] [place], [place] [address]  Comprehensive metabolic panel     Status: Abnormal   Collection Time: 05/12/20  1:43 PM  Result Value Ref Range   Sodium 140 135 - 145 mmol/L   Potassium 4.6 3.5 - 5.1 mmol/L   Chloride 112 (H) 98 - 111 mmol/L   CO2 16 (L) 22 - 32 mmol/L   Glucose, Bld 186 (H) 70 - 99 mg/dL    Comment: Glucose reference range applies only to samples taken after fasting for at least 8 hours.   BUN 90 (H) 6 - 20 mg/dL   Creatinine, Ser 4.26 (H) 0.44 - 1.00 mg/dL   Calcium 9.3 8.9 - 10.3 mg/dL   Total Protein 7.7 6.5 - 8.1 g/dL   Albumin 3.9 3.5 - 5.0 g/dL   AST 37 15 - 41 U/L   ALT 63 (H) 0 - 44 U/L    Alkaline Phosphatase 65 38 - 126 U/L   Total Bilirubin 0.5 0.3 - 1.2 mg/dL   GFR calc non Af Amer 11 (L) >60 mL/min   GFR calc Af Amer 12 (L) >60 mL/min   Anion gap 12 5 - 15    Comment: Performed at [facility], [address] [place], [place] [address]  CBC     Status: Abnormal   Collection Time: 05/12/20  1:43 PM  Result Value Ref Range   WBC 10.6 (H) 4.0 - 10.5 K/uL   RBC 3.36 (L) 3.87 - 5.11 MIL/uL   Hemoglobin 9.6 (L) 12.0 - 15.0 g/dL   HCT 28.4 (L) 36.0 - 46.0 %   MCV 84.5 80.0 - 100.0 fL   MCH 28.6 26.0 - 34.0 pg   MCHC 33.8 30.0 - 36.0 g/dL   RDW 14.9 11.5 - 15.5 %   Platelets 286 150 - 400 K/uL   nRBC 0.0 0.0 - 0.2 %    Comment: Performed at [facility], [address] [place], [place] [address]  Urinalysis, Complete w Microscopic     Status: Abnormal   Collection Time: 05/12/20  2:09 PM  Result Value Ref Range   Color, Urine STRAW (A) YELLOW   APPearance HAZY (A) CLEAR   Specific Gravity, Urine 1.011 1.005 - 1.030   pH 5.0 5.0 - 8.0   Glucose, UA 150 (A) NEGATIVE mg/dL   Hgb urine dipstick LARGE (A) NEGATIVE   Bilirubin Urine NEGATIVE NEGATIVE   Ketones, ur NEGATIVE NEGATIVE mg/dL   Protein, ur 100 (A) NEGATIVE mg/dL   Nitrite NEGATIVE NEGATIVE   Leukocytes,Ua NEGATIVE NEGATIVE   RBC / HPF 0-5 0 - 5 RBC/hpf   WBC, UA 6-10 0 - 5 WBC/hpf   Bacteria, UA NONE SEEN NONE SEEN   Squamous Epithelial / LPF 0-5 0 - 5    Comment: Performed at [facility], [address] [place], [place] [address]  SARS Coronavirus 2 by RT PCR (hospital order, performed in [place] hospital lab) Nasopharyngeal Nasopharyngeal Swab     Status: None   Collection Time: 05/12/20  5:41 PM   Specimen: Nasopharyngeal Swab  Result Value Ref Range   SARS Coronavirus 2 NEGATIVE NEGATIVE    Comment: (NOTE) SARS-CoV-2 target nucleic acids are NOT DETECTED. The SARS-CoV-2 RNA is generally detectable in upper and lower respiratory specimens during the acute phase  of infection. The lowest concentration of SARS-CoV-2 viral copies this assay can detect is 250 copies / mL. A negative result does not preclude SARS-CoV-2 infection and should not be used as the  sole basis for treatment or other patient management decisions.  A negative result may occur with improper specimen collection / handling, submission of specimen other than nasopharyngeal swab, presence of viral mutation(s) within the areas targeted by this assay, and inadequate number of viral copies (<250 copies / mL). A negative result must be combined with clinical observations, patient history, and epidemiological information. Fact Sheet for Patients:   StrictlyIdeas.no Fact Sheet for Healthcare Providers: BankingDealers.co.za This test is not yet approved or cleared  by the [place] FDA and has been authorized for detection and/or diagnosis of SARS-CoV-2 by FDA under an Emergency Use Authorization (EUA).  This EUA will remain in effect (meaning this test can be used) for the duration of the COVID-19 declaration under Section 564(b)(1) of the Act, 21 U.S.C. section 360bbb-3(b)(1), unless the authorization is terminated or revoked sooner. Performed at [facility], [address] [place], [place] [address]    CT Renal Joaquim Lai Study  Result Date: 05/12/2020 CLINICAL DATA:  Lower back and lower abdominal pain, urinary urgency and frequency, intermittent nausea EXAM: CT ABDOMEN AND PELVIS WITHOUT CONTRAST TECHNIQUE: Multidetector CT imaging of the abdomen and pelvis was performed following the standard protocol without IV contrast. COMPARISON:  12/11/2018 FINDINGS: Lower chest: No acute pleural or parenchymal lung disease. Hepatobiliary: Numerous calcified gallstones are again identified, with no evidence of acute cholecystitis. Enhanced imaging of the liver is unremarkable. Pancreas: Unremarkable. No pancreatic ductal dilatation or  surrounding inflammatory changes. Spleen: Normal in size without focal abnormality. Adrenals/Urinary Tract: No urinary tract calculi or obstructive uropathy within either kidney. Bilateral renal cortical atrophy unchanged. The adrenals are unremarkable. Bladder is minimally distended with no focal abnormality. Stomach/Bowel: No bowel obstruction or ileus. Normal appendix right lower quadrant. No bowel wall thickening or inflammatory change. Vascular/Lymphatic: There is severe atherosclerosis of the aorta and its distal branches. No pathologic adenopathy. Reproductive: Uterus and bilateral adnexa are unremarkable. Other: No abdominal wall hernia or abnormality. No abdominopelvic ascites. Musculoskeletal: No acute or destructive bony lesions. Reconstructed images demonstrate no additional findings. IMPRESSION: 1. No urinary tract calculi or obstructive uropathy. 2. Cholelithiasis without evidence of acute cholecystitis. 3. Aortic Atherosclerosis (ICD10-I70.0). Electronically Signed   By: Randa Ngo M.D.   On: 05/12/2020 18:36   US Abdomen Limited RUQ  Result Date: 05/12/2020 CLINICAL DATA:  Right-sided pain EXAM: ULTRASOUND ABDOMEN LIMITED RIGHT UPPER QUADRANT COMPARISON:  CT 05/12/2020 FINDINGS: Gallbladder: Sludge and small stones within the gallbladder. Normal wall thickness. Negative sonographic Murphy. Nodular echogenic foci along the anterior wall of gallbladder, possible cholesterolosis. Common bile duct: Diameter: 1.4 mm Liver: No focal lesion identified. Within normal limits in parenchymal echogenicity. Portal vein is patent on color Doppler imaging with normal direction of blood flow towards the liver. Other: None. IMPRESSION: Sludge and stones within the gallbladder but without other sonographic features to suggest acute cholecystitis. No biliary dilatation. Electronically Signed   By: Donavan Foil M.D.   On: 05/12/2020 21:10    Pending Labs Unresulted Labs (From admission, onward)    Start      Ordered   05/19/20 0500  Creatinine, serum  (enoxaparin (LOVENOX)    CrCl >/= 30 ml/min)  Weekly,   STAT    Comments: while on enoxaparin therapy    05/12/20 1908   05/12/20 1907  Hemoglobin A1c  Add-on,   AD     05/12/20 1908   05/12/20 1848  Urine Culture  Add-on,   AD     05/12/20 1848  Vitals/Pain Today's Vitals   05/12/20 2000 05/12/20 2030 05/12/20 2057 05/12/20 2100  BP: (!) 147/62 (!) 143/57  (!) 163/69  Pulse:    (!) 54  Resp:      Temp:      TempSrc:      SpO2:    99%  Weight:      Height:      PainSc:   5      Isolation Precautions No active isolations  Medications Medications  sodium chloride flush (NS) 0.9 % injection 3 mL (3 mLs Intravenous Not Given 05/12/20 2220)  amLODipine (NORVASC) tablet 10 mg (has no administration in time range)  furosemide (LASIX) tablet 20 mg (20 mg Oral Refused 05/12/20 2103)  isosorbide mononitrate (IMDUR) 24 hr tablet 30 mg (30 mg Oral Refused 05/12/20 2058)  losartan (COZAAR) tablet 100 mg (100 mg Oral Refused 05/12/20 2103)  metoprolol tartrate (LOPRESSOR) tablet 100 mg (has no administration in time range)  rosuvastatin (CRESTOR) tablet 20 mg (has no administration in time range)  insulin glargine (LANTUS) injection 40 Units (has no administration in time range)  clopidogrel (PLAVIX) tablet 75 mg (has no administration in time range)  aspirin EC tablet 81 mg (has no administration in time range)  cefTRIAXone (ROCEPHIN) 2 g in sodium chloride 0.9 % 100 mL IVPB (0 g Intravenous Stopped 05/12/20 2218)  enoxaparin (LOVENOX) injection 40 mg (has no administration in time range)  0.9 %  sodium chloride infusion ( Intravenous New Bag/Given 05/12/20 2112)  acetaminophen (TYLENOL) tablet 650 mg (has no administration in time range)    Or  acetaminophen (TYLENOL) suppository 650 mg (has no administration in time range)  HYDROcodone-acetaminophen (NORCO/VICODIN) 5-325 MG per tablet 1-2 tablet (has no administration in time range)   morphine 2 MG/ML injection 2 mg (has no administration in time range)  insulin aspart (novoLOG) injection 0-20 Units (has no administration in time range)  insulin aspart (novoLOG) injection 0-5 Units (has no administration in time range)  sodium chloride 0.9 % bolus 1,000 mL (1,000 mLs Intravenous New Bag/Given 05/12/20 1740)    Mobility walks Low fall risk   Focused Assessments Renal Assessment Handoff:   R Recommendations: See Admitting Provider Note  Report given to:   Additional Notes:

## 2020-05-12 NOTE — ED Triage Notes (Addendum)
C/O "kidney's not working".  Friday creatnine was elevated.  Also c/o lower back and lower abdominal pain.  C/O urinary urgency and frequency.

## 2020-05-13 ENCOUNTER — Ambulatory Visit: Payer: Medicare Other

## 2020-05-13 ENCOUNTER — Encounter: Payer: Self-pay | Admitting: *Deleted

## 2020-05-13 DIAGNOSIS — N179 Acute kidney failure, unspecified: Secondary | ICD-10-CM | POA: Diagnosis not present

## 2020-05-13 DIAGNOSIS — I5032 Chronic diastolic (congestive) heart failure: Secondary | ICD-10-CM

## 2020-05-13 LAB — GLUCOSE, CAPILLARY
Glucose-Capillary: 160 mg/dL — ABNORMAL HIGH (ref 70–99)
Glucose-Capillary: 186 mg/dL — ABNORMAL HIGH (ref 70–99)
Glucose-Capillary: 195 mg/dL — ABNORMAL HIGH (ref 70–99)
Glucose-Capillary: 234 mg/dL — ABNORMAL HIGH (ref 70–99)

## 2020-05-13 LAB — COMPREHENSIVE METABOLIC PANEL
ALT: 63 U/L — ABNORMAL HIGH (ref 0–44)
AST: 43 U/L — ABNORMAL HIGH (ref 15–41)
Albumin: 3.9 g/dL (ref 3.5–5.0)
Alkaline Phosphatase: 60 U/L (ref 38–126)
Anion gap: 9 (ref 5–15)
BUN: 75 mg/dL — ABNORMAL HIGH (ref 6–20)
CO2: 19 mmol/L — ABNORMAL LOW (ref 22–32)
Calcium: 9.2 mg/dL (ref 8.9–10.3)
Chloride: 112 mmol/L — ABNORMAL HIGH (ref 98–111)
Creatinine, Ser: 3.76 mg/dL — ABNORMAL HIGH (ref 0.44–1.00)
GFR calc Af Amer: 15 mL/min — ABNORMAL LOW (ref >60)
GFR calc non Af Amer: 13 mL/min — ABNORMAL LOW (ref >60)
Glucose, Bld: 220 mg/dL — ABNORMAL HIGH (ref 70–99)
Potassium: 4.4 mmol/L (ref 3.5–5.1)
Sodium: 140 mmol/L (ref 135–145)
Total Bilirubin: 0.4 mg/dL (ref 0.3–1.2)
Total Protein: 7.6 g/dL (ref 6.5–8.1)

## 2020-05-13 LAB — HEMOGLOBIN A1C
Hgb A1c MFr Bld: 8.5 % — ABNORMAL HIGH (ref 4.8–5.6)
Mean Plasma Glucose: 197.25 mg/dL

## 2020-05-13 LAB — CBC WITH DIFFERENTIAL/PLATELET
Abs Immature Granulocytes: 0.03 10*3/uL (ref 0.00–0.07)
Basophils Absolute: 0.1 10*3/uL (ref 0.0–0.1)
Basophils Relative: 1 %
Eosinophils Absolute: 0.4 10*3/uL (ref 0.0–0.5)
Eosinophils Relative: 4 %
HCT: 27.5 % — ABNORMAL LOW (ref 36.0–46.0)
Hemoglobin: 9.5 g/dL — ABNORMAL LOW (ref 12.0–15.0)
Immature Granulocytes: 0 %
Lymphocytes Relative: 21 %
Lymphs Abs: 2.3 10*3/uL (ref 0.7–4.0)
MCH: 29 pg (ref 26.0–34.0)
MCHC: 34.5 g/dL (ref 30.0–36.0)
MCV: 83.8 fL (ref 80.0–100.0)
Monocytes Absolute: 0.7 10*3/uL (ref 0.1–1.0)
Monocytes Relative: 7 %
Neutro Abs: 7.1 10*3/uL (ref 1.7–7.7)
Neutrophils Relative %: 67 %
Platelets: 274 10*3/uL (ref 150–400)
RBC: 3.28 MIL/uL — ABNORMAL LOW (ref 3.87–5.11)
RDW: 14.9 % (ref 11.5–15.5)
WBC: 10.6 10*3/uL — ABNORMAL HIGH (ref 4.0–10.5)
nRBC: 0 % (ref 0.0–0.2)

## 2020-05-13 LAB — CK: Total CK: 2958 U/L — ABNORMAL HIGH (ref 38–234)

## 2020-05-13 MED ORDER — HYDROCODONE-ACETAMINOPHEN 5-325 MG PO TABS: 1.0000 | ORAL_TABLET | Freq: Four times a day (QID) | ORAL | Status: DC | PRN

## 2020-05-13 MED ORDER — ENOXAPARIN SODIUM 30 MG/0.3ML ~~LOC~~ SOLN: 30.0000 mg | SUBCUTANEOUS | Status: DC

## 2020-05-13 MED ORDER — HYDRALAZINE HCL 25 MG PO TABS: 25.0000 mg | ORAL_TABLET | Freq: Four times a day (QID) | ORAL | Status: DC

## 2020-05-13 MED ORDER — HEPARIN SODIUM (PORCINE) 5000 UNIT/ML IJ SOLN
5000.0000 [IU] | Freq: Three times a day (TID) | INTRAMUSCULAR | Status: DC
  Administered 2020-05-13 – 2020-05-15 (×5): 5000 [IU] via SUBCUTANEOUS
  Filled 2020-05-13 (×7): qty 1

## 2020-05-13 MED ORDER — METHOCARBAMOL 500 MG PO TABS
500.0000 mg | ORAL_TABLET | Freq: Three times a day (TID) | ORAL | Status: DC | PRN
  Administered 2020-05-13: 500 mg via ORAL
  Filled 2020-05-13 (×2): qty 1

## 2020-05-13 MED ORDER — METOPROLOL TARTRATE 50 MG PO TABS
50.0000 mg | ORAL_TABLET | Freq: Two times a day (BID) | ORAL | Status: DC
  Administered 2020-05-13 – 2020-05-15 (×5): 50 mg via ORAL
  Filled 2020-05-13 (×6): qty 1

## 2020-05-13 MED ORDER — INSULIN ASPART 100 UNIT/ML ~~LOC~~ SOLN
10.0000 [IU] | Freq: Three times a day (TID) | SUBCUTANEOUS | Status: DC
  Administered 2020-05-13 – 2020-05-15 (×7): 10 [IU] via SUBCUTANEOUS
  Filled 2020-05-13 (×7): qty 1

## 2020-05-13 MED ORDER — LACTATED RINGERS IV SOLN: INTRAVENOUS | Status: DC

## 2020-05-13 MED ORDER — HYDRALAZINE HCL 50 MG PO TABS
50.0000 mg | ORAL_TABLET | Freq: Three times a day (TID) | ORAL | Status: DC
  Administered 2020-05-13 – 2020-05-14 (×4): 50 mg via ORAL
  Filled 2020-05-13 (×6): qty 1

## 2020-05-13 NOTE — Progress Notes (Signed)
PHARMACIST - PHYSICIAN COMMUNICATION  CONCERNING:  Enoxaparin (Lovenox) for DVT Prophylaxis    RECOMMENDATION: Patient was prescribed enoxaprin 40mg  q24 hours for VTE prophylaxis.   Filed Weights   05/12/20 1339  Weight: (!) 144.3 kg (318 lb 2 oz)    Body mass index is 49.53 kg/m.  Estimated Creatinine Clearance: 21.6 mL/min (A) (by C-G formula based on SCr of 4.26 mg/dL (H)).   Patient is candidate for enoxaparin 30mg  every 24 hours based on CrCl <45ml/min or Weight less then 45kg for women or <57kg for men   DESCRIPTION: Pharmacy has adjusted enoxaparin dose per [facility] policy.   Patient is now receiving enoxaparin 30mg  every 24 hours.  Ena Dawley, PharmD Clinical Pharmacist  05/13/2020 4:11 AM

## 2020-05-13 NOTE — Progress Notes (Signed)
[facility] Progress Note  Patient: Sharon Arias    TMH:[id number]  DOA: 05/12/2020     Date of Service: the patient was seen and examined on 05/13/2020  Chief Complaint  Patient presents with  . Abdominal Pain  . Back Pain   Brief hospital course: Past medical history of type II DM, CKD 4, anemia of chronic kidney disease, CAD, obesity.  Patient presented with right-sided flank pain.  Found to have AKI, mild rhabdo and possible UTI. Currently further plan is continue IV antibiotics.  Assessment and Plan: 1.  AKI on chronic renal disease stage IV. Nephrology consulted. Continue IV hydration. Improving renal function. Urine output adequate as well. Avoid nephrotoxic medication. CK elevated discontinue Lipitor  2.  Rhabdomyolysis CK elevated. Continue with IV hydration.  3.  Right-sided flank pain Likely musculoskeletal. CT renal stone study negative. Ultrasound abdomen also negative for any acute abnormality other than gallbladder sludge. LFT stable. Continue to monitor.  4.  Possible UTI. Patient was started on IV Rocephin cultures and follow-up on cultures.  5.  Anemia chronic kidney disease H&H relatively stable.  Monitor.  6.  Coronary disease Continue home regimen prevention continue chest pain.  7.  Type 2 diabetes mellitus with renal complication Continue home regimen.  Sliding scale insulin.  8.  Obesity morbid Body mass index is 49.53 kg/m.   Diet: Heart healthy carb modified diet DVT Prophylaxis: Subcutaneous Heparin    Advance goals of care discussion: Full code  Family Communication: no family was present at bedside, at the time of interview.   Disposition:  Status is: Observation  Likely can go home tomorrow depending on the renal function.  And CK  Subjective: No nausea no vomiting no fever no chills.  Continues to have pain.  No diarrhea no constipation.  No trauma no injury reported.  Physical Exam: General:  alert oriented to  time, place, and person.  Appear in mild distress, affect appropriate Eyes: PERRL ENT: Oral Mucosa Clear, moist  Neck: no JVD,  Cardiovascular: S1 and S2 Present, no Murmur,  Respiratory: good respiratory effort, Bilateral Air entry equal and Decreased, no Crackles, no wheezes Abdomen: Bowel Sound present, Soft and no tenderness,  Skin: no rash Extremities: no Pedal edema, no calf tenderness Neurologic: without any new focal findings Gait not checked due to patient safety concerns  Vitals:   05/13/20 0948 05/13/20 1200 05/13/20 1612 05/13/20 1953  BP:  (!) 141/57 (!) 148/55 (!) 141/53  Pulse: (!) 58 (!) 53 (!) 54 (!) 57  Resp:  18 17 18   Temp:   98.6 F (37 C) 98.3 F (36.8 C)  TempSrc:   Oral Oral  SpO2:  100% 100% 100%  Weight:      Height:        Intake/Output Summary (Last 24 hours) at 05/13/2020 2023 Last data filed at 05/13/2020 1738 Gross per 24 hour  Intake 1114.55 ml  Output 2550 ml  Net -1435.45 ml   Filed Weights   05/12/20 1339  Weight: (!) 144.3 kg    Data Reviewed: I have personally reviewed and interpreted daily labs, tele strips, imagings as discussed above. I reviewed all nursing notes, pharmacy notes, vitals, pertinent old records I have discussed plan of care as described above with RN and patient/family.  CBC: Recent Labs  Lab 05/12/20 1343 05/13/20 1001  WBC 10.6* 10.6*  NEUTROABS  --  7.1  HGB 9.6* 9.5*  HCT 28.4* 27.5*  MCV 84.5 83.8  PLT 286 274  Basic Metabolic Panel: Recent Labs  Lab 05/12/20 1343 05/13/20 1001  NA 140 140  K 4.6 4.4  CL 112* 112*  CO2 16* 19*  GLUCOSE 186* 220*  BUN 90* 75*  CREATININE 4.26* 3.76*  CALCIUM 9.3 9.2    Studies: US Abdomen Limited RUQ  Result Date: 05/12/2020 CLINICAL DATA:  Right-sided pain EXAM: ULTRASOUND ABDOMEN LIMITED RIGHT UPPER QUADRANT COMPARISON:  CT 05/12/2020 FINDINGS: Gallbladder: Sludge and small stones within the gallbladder. Normal wall thickness. Negative sonographic  Murphy. Nodular echogenic foci along the anterior wall of gallbladder, possible cholesterolosis. Common bile duct: Diameter: 1.4 mm Liver: No focal lesion identified. Within normal limits in parenchymal echogenicity. Portal vein is patent on color Doppler imaging with normal direction of blood flow towards the liver. Other: None. IMPRESSION: Sludge and stones within the gallbladder but without other sonographic features to suggest acute cholecystitis. No biliary dilatation. Electronically Signed   By: Donavan Foil M.D.   On: 05/12/2020 21:10    Scheduled Meds: . allopurinol  300 mg Oral Daily  . amLODipine  10 mg Oral QHS  . aspirin  81 mg Oral BH-q7a  . clopidogrel  75 mg Oral Daily  . hydrALAZINE  50 mg Oral TID  . insulin aspart  0-20 Units Subcutaneous TID WC  . insulin aspart  0-5 Units Subcutaneous QHS  . insulin aspart  10 Units Subcutaneous TID WC  . insulin glargine  40 Units Subcutaneous QHS  . isosorbide mononitrate  30 mg Oral Daily  . metoprolol tartrate  50 mg Oral BID  . sodium chloride flush  3 mL Intravenous Once   Continuous Infusions: . cefTRIAXone (ROCEPHIN)  IV Stopped (05/12/20 2218)  . lactated ringers 125 mL/hr at 05/13/20 1738   PRN Meds: acetaminophen **OR** acetaminophen, HYDROcodone-acetaminophen, methocarbamol  Time spent: 35 minutes  Author: Berle Mull, MD [facility] 05/13/2020 8:23 PM  To reach On-call, see care teams to locate the attending and reach out to them via [email or web address]. If 7PM-7AM, please contact night-coverage If you still have difficulty reaching the attending provider, please page the [facility] (Director on Call) for [facility] on amion for assistance.

## 2020-05-13 NOTE — Progress Notes (Signed)
Nephrology consult still pending. Secure chat to Dr. Candiss Norse who stated it would probably be tomorrow before he sees patient. Patient updated.

## 2020-05-13 NOTE — Progress Notes (Signed)
Lab notified new orders for patient- will send a tech up shortly.

## 2020-05-14 DIAGNOSIS — K8020 Calculus of gallbladder without cholecystitis without obstruction: Secondary | ICD-10-CM | POA: Diagnosis present

## 2020-05-14 DIAGNOSIS — I700 Atherosclerosis of aorta: Secondary | ICD-10-CM | POA: Diagnosis present

## 2020-05-14 DIAGNOSIS — I5033 Acute on chronic diastolic (congestive) heart failure: Secondary | ICD-10-CM | POA: Diagnosis present

## 2020-05-14 DIAGNOSIS — Z20822 Contact with and (suspected) exposure to covid-19: Secondary | ICD-10-CM | POA: Diagnosis present

## 2020-05-14 DIAGNOSIS — R001 Bradycardia, unspecified: Secondary | ICD-10-CM | POA: Diagnosis not present

## 2020-05-14 DIAGNOSIS — D631 Anemia in chronic kidney disease: Secondary | ICD-10-CM | POA: Diagnosis present

## 2020-05-14 DIAGNOSIS — E1165 Type 2 diabetes mellitus with hyperglycemia: Secondary | ICD-10-CM

## 2020-05-14 DIAGNOSIS — IMO0002 Reserved for concepts with insufficient information to code with codable children: Secondary | ICD-10-CM | POA: Diagnosis present

## 2020-05-14 DIAGNOSIS — N184 Chronic kidney disease, stage 4 (severe): Secondary | ICD-10-CM

## 2020-05-14 DIAGNOSIS — N390 Urinary tract infection, site not specified: Secondary | ICD-10-CM | POA: Diagnosis present

## 2020-05-14 DIAGNOSIS — I2510 Atherosclerotic heart disease of native coronary artery without angina pectoris: Secondary | ICD-10-CM

## 2020-05-14 DIAGNOSIS — E1122 Type 2 diabetes mellitus with diabetic chronic kidney disease: Secondary | ICD-10-CM | POA: Diagnosis present

## 2020-05-14 DIAGNOSIS — E118 Type 2 diabetes mellitus with unspecified complications: Secondary | ICD-10-CM

## 2020-05-14 DIAGNOSIS — R109 Unspecified abdominal pain: Secondary | ICD-10-CM | POA: Diagnosis present

## 2020-05-14 DIAGNOSIS — E1140 Type 2 diabetes mellitus with diabetic neuropathy, unspecified: Secondary | ICD-10-CM | POA: Diagnosis present

## 2020-05-14 DIAGNOSIS — Z7902 Long term (current) use of antithrombotics/antiplatelets: Secondary | ICD-10-CM | POA: Diagnosis not present

## 2020-05-14 DIAGNOSIS — N179 Acute kidney failure, unspecified: Secondary | ICD-10-CM | POA: Diagnosis present

## 2020-05-14 DIAGNOSIS — M6282 Rhabdomyolysis: Secondary | ICD-10-CM | POA: Diagnosis present

## 2020-05-14 DIAGNOSIS — I5032 Chronic diastolic (congestive) heart failure: Secondary | ICD-10-CM

## 2020-05-14 DIAGNOSIS — Z794 Long term (current) use of insulin: Secondary | ICD-10-CM | POA: Diagnosis not present

## 2020-05-14 DIAGNOSIS — H548 Legal blindness, as defined in USA: Secondary | ICD-10-CM | POA: Diagnosis present

## 2020-05-14 DIAGNOSIS — Z6841 Body Mass Index (BMI) 40.0 and over, adult: Secondary | ICD-10-CM

## 2020-05-14 DIAGNOSIS — B952 Enterococcus as the cause of diseases classified elsewhere: Secondary | ICD-10-CM | POA: Diagnosis present

## 2020-05-14 DIAGNOSIS — R1011 Right upper quadrant pain: Secondary | ICD-10-CM

## 2020-05-14 DIAGNOSIS — I130 Hypertensive heart and chronic kidney disease with heart failure and stage 1 through stage 4 chronic kidney disease, or unspecified chronic kidney disease: Secondary | ICD-10-CM | POA: Diagnosis present

## 2020-05-14 DIAGNOSIS — Z8673 Personal history of transient ischemic attack (TIA), and cerebral infarction without residual deficits: Secondary | ICD-10-CM | POA: Diagnosis not present

## 2020-05-14 LAB — RENAL FUNCTION PANEL
Albumin: 3.6 g/dL (ref 3.5–5.0)
Anion gap: 9 (ref 5–15)
BUN: 78 mg/dL — ABNORMAL HIGH (ref 6–20)
CO2: 18 mmol/L — ABNORMAL LOW (ref 22–32)
Calcium: 8.7 mg/dL — ABNORMAL LOW (ref 8.9–10.3)
Chloride: 111 mmol/L (ref 98–111)
Creatinine, Ser: 3.6 mg/dL — ABNORMAL HIGH (ref 0.44–1.00)
GFR calc Af Amer: 15 mL/min — ABNORMAL LOW (ref >60)
GFR calc non Af Amer: 13 mL/min — ABNORMAL LOW (ref >60)
Glucose, Bld: 170 mg/dL — ABNORMAL HIGH (ref 70–99)
Phosphorus: 5.2 mg/dL — ABNORMAL HIGH (ref 2.5–4.6)
Potassium: 4.4 mmol/L (ref 3.5–5.1)
Sodium: 138 mmol/L (ref 135–145)

## 2020-05-14 LAB — GLUCOSE, CAPILLARY
Glucose-Capillary: 74 mg/dL (ref 70–99)
Glucose-Capillary: 114 mg/dL — ABNORMAL HIGH (ref 70–99)
Glucose-Capillary: 161 mg/dL — ABNORMAL HIGH (ref 70–99)
Glucose-Capillary: 199 mg/dL — ABNORMAL HIGH (ref 70–99)
Glucose-Capillary: 159 mg/dL — ABNORMAL HIGH (ref 70–99)

## 2020-05-14 LAB — CBC
HCT: 26.9 % — ABNORMAL LOW (ref 36.0–46.0)
Hemoglobin: 8.9 g/dL — ABNORMAL LOW (ref 12.0–15.0)
MCH: 28.7 pg (ref 26.0–34.0)
MCHC: 33.1 g/dL (ref 30.0–36.0)
MCV: 86.8 fL (ref 80.0–100.0)
Platelets: 250 10*3/uL (ref 150–400)
RBC: 3.1 MIL/uL — ABNORMAL LOW (ref 3.87–5.11)
RDW: 14.9 % (ref 11.5–15.5)
WBC: 9.7 10*3/uL (ref 4.0–10.5)
nRBC: 0 % (ref 0.0–0.2)

## 2020-05-14 LAB — CK: Total CK: 2227 U/L — ABNORMAL HIGH (ref 38–234)

## 2020-05-14 MED ORDER — SODIUM CHLORIDE 0.9 % IV SOLN: INTRAVENOUS | Status: DC

## 2020-05-14 MED ORDER — HYDRALAZINE HCL 25 MG PO TABS
75.0000 mg | ORAL_TABLET | Freq: Three times a day (TID) | ORAL | Status: DC
  Administered 2020-05-14 – 2020-05-15 (×3): 75 mg via ORAL
  Filled 2020-05-14 (×5): qty 3

## 2020-05-14 MED ORDER — AMOXICILLIN 500 MG PO CAPS
500.0000 mg | ORAL_CAPSULE | Freq: Three times a day (TID) | ORAL | Status: DC
  Administered 2020-05-14 – 2020-05-15 (×3): 500 mg via ORAL
  Filled 2020-05-14 (×5): qty 1

## 2020-05-14 NOTE — Progress Notes (Signed)
[facility], [place] 05/14/20  Subjective:   LOS: 0 05/25 0701 - 05/26 0700 In: -  Out: 2650 [Urine:2650] Patient known to our practice from outpatient follow-up.  Her baseline creatinine is 2.57/GFR 23 from April 2021 She has come in for evaluation of abdominal pain, some dysuria No change in bowel habits Admission creatinine of 4.3 which has improved to 3.60 today with IV hydration GFR improved to 13 CK level of 2900 at admission  Covid test negative Urinalysis shows glucosuria, large hemoglobin, proteinuria urine culture negative so far.  Review of records show UTI from Enterococcus faecalis in March 2021 CT abdomen stone study is negative for urinary tract calculi or obstructive nephropathy.  Evidence of cholelithiasis without acute cholecystitis.  Aortic atherosclerosis was also noted.  Bilateral renal atrophy was noted.  Last echocardiogram from March 30 8 shows LVEF 55 to 60% with grade 2 diastolic dysfunction  Feels better today Flank pain has improved No shortness of breath or leg edema   Objective:  Vital signs in last 24 hours:  Temp:  [97.9 F (36.6 C)-98.6 F (37 C)] 98.6 F (37 C) (05/26 0806) Pulse Rate:  [51-62] 52 (05/26 0806) Resp:  [17-18] 18 (05/26 0806) BP: (125-163)/(41-63) 146/54 (05/26 0806) SpO2:  [97 %-100 %] 100 % (05/26 0806)  Weight change:  Filed Weights   05/12/20 1339  Weight: (!) 144.3 kg    Intake/Output:    Intake/Output Summary (Last 24 hours) at 05/14/2020 0814 Last data filed at 05/14/2020 0600 Gross per 24 hour  Intake --  Output 2650 ml  Net -2650 ml    Physical Exam: General:  No acute distress, laying in the bed  HEENT  anicteric, moist oral mucous membrane  Pulm/lungs  normal breathing effort, lungs are clear to auscultation  CVS/Heart  regular rhythm, no rub or gallop  Abdomen:   Soft, nontender  Extremities:  No peripheral edema  Neurologic:  Alert, oriented, able to follow commands   Skin:  No acute rashes     Basic Metabolic Panel:  Recent Labs  Lab 05/12/20 1343 05/13/20 1001 05/14/20 0537  NA 140 140 138  K 4.6 4.4 4.4  CL 112* 112* 111  CO2 16* 19* 18*  GLUCOSE 186* 220* 170*  BUN 90* 75* 78*  CREATININE 4.26* 3.76* 3.60*  CALCIUM 9.3 9.2 8.7*  PHOS  --   --  5.2*     CBC: Recent Labs  Lab 05/12/20 1343 05/13/20 1001 05/14/20 0537  WBC 10.6* 10.6* 9.7  NEUTROABS  --  7.1  --   HGB 9.6* 9.5* 8.9*  HCT 28.4* 27.5* 26.9*  MCV 84.5 83.8 86.8  PLT 286 274 250     No results found for: HEPBSAG, HEPBSAB, HEPBIGM    Microbiology:  Recent Results (from the past 240 hour(s))  Urine Culture     Status: None (Preliminary result)   Collection Time: 05/12/20  1:43 PM   Specimen: Urine, Random  Result Value Ref Range Status   Specimen Description   Final    URINE, RANDOM Performed at [facility], [address], [place], [place] [address]    Special Requests   Final    NONE Performed at [facility], [address], [place], [place] [address]    Culture   Final    CULTURE REINCUBATED FOR BETTER GROWTH Performed at [facility], [place] [address], [place], [place] [address]    Report Status PENDING  Incomplete  SARS Coronavirus 2 by RT PCR (hospital order, performed in [facility] hospital lab) Nasopharyngeal Nasopharyngeal Swab     Status: None   Collection Time: 05/12/20  5:41 PM   Specimen: Nasopharyngeal Swab  Result Value Ref Range Status   SARS Coronavirus 2 NEGATIVE NEGATIVE Final    Comment: (NOTE) SARS-CoV-2 target nucleic acids are NOT DETECTED. The SARS-CoV-2 RNA is generally detectable in upper and lower respiratory specimens during the acute phase of infection. The lowest concentration of SARS-CoV-2 viral copies this assay can detect is 250 copies / mL. A negative result does not preclude SARS-CoV-2 infection and should not be used as the sole basis for treatment or other patient management  decisions.  A negative result may occur with improper specimen collection / handling, submission of specimen other than nasopharyngeal swab, presence of viral mutation(s) within the areas targeted by this assay, and inadequate number of viral copies (<250 copies / mL). A negative result must be combined with clinical observations, patient history, and epidemiological information. Fact Sheet for Patients:   StrictlyIdeas.no Fact Sheet for Healthcare Providers: BankingDealers.co.za This test is not yet approved or cleared  by the [place] FDA and has been authorized for detection and/or diagnosis of SARS-CoV-2 by FDA under an Emergency Use Authorization (EUA).  This EUA will remain in effect (meaning this test can be used) for the duration of the COVID-19 declaration under Section 564(b)(1) of the Act, 21 U.S.C. section 360bbb-3(b)(1), unless the authorization is terminated or revoked sooner. Performed at [facility], [address] [place], [place] [address]     Coagulation Studies: No results for input(s): LABPROT, INR in the last 72 hours.  Urinalysis: Recent Labs    05/12/20 1409  COLORURINE STRAW*  LABSPEC 1.011  PHURINE 5.0  GLUCOSEU 150*  HGBUR LARGE*  BILIRUBINUR NEGATIVE  KETONESUR NEGATIVE  PROTEINUR 100*  NITRITE NEGATIVE  LEUKOCYTESUR NEGATIVE      Imaging: CT Renal Stone Study  Result Date: 05/12/2020 CLINICAL DATA:  Lower back and lower abdominal pain, urinary urgency and frequency, intermittent nausea EXAM: CT ABDOMEN AND PELVIS WITHOUT CONTRAST TECHNIQUE: Multidetector CT imaging of the abdomen and pelvis was performed following the standard protocol without IV contrast. COMPARISON:  12/11/2018 FINDINGS: Lower chest: No acute pleural or parenchymal lung disease. Hepatobiliary: Numerous calcified gallstones are again identified, with no evidence of acute cholecystitis. Enhanced imaging of the  liver is unremarkable. Pancreas: Unremarkable. No pancreatic ductal dilatation or surrounding inflammatory changes. Spleen: Normal in size without focal abnormality. Adrenals/Urinary Tract: No urinary tract calculi or obstructive uropathy within either kidney. Bilateral renal cortical atrophy unchanged. The adrenals are unremarkable. Bladder is minimally distended with no focal abnormality. Stomach/Bowel: No bowel obstruction or ileus. Normal appendix right lower quadrant. No bowel wall thickening or inflammatory change. Vascular/Lymphatic: There is severe atherosclerosis of the aorta and its distal branches. No pathologic adenopathy. Reproductive: Uterus and bilateral adnexa are unremarkable. Other: No abdominal wall hernia or abnormality. No abdominopelvic ascites. Musculoskeletal: No acute or destructive bony lesions. Reconstructed images demonstrate no additional findings. IMPRESSION: 1. No urinary tract calculi or obstructive uropathy. 2. Cholelithiasis without evidence of acute cholecystitis. 3. Aortic Atherosclerosis (ICD10-I70.0). Electronically Signed   By: Randa Ngo M.D.   On: 05/12/2020 18:36   US Abdomen Limited RUQ  Result Date: 05/12/2020 CLINICAL DATA:  Right-sided pain EXAM: ULTRASOUND ABDOMEN LIMITED RIGHT UPPER QUADRANT COMPARISON:  CT 05/12/2020 FINDINGS: Gallbladder: Sludge and small stones within the gallbladder. Normal wall thickness. Negative sonographic Murphy. Nodular echogenic  foci along the anterior wall of gallbladder, possible cholesterolosis. Common bile duct: Diameter: 1.4 mm Liver: No focal lesion identified. Within normal limits in parenchymal echogenicity. Portal vein is patent on color Doppler imaging with normal direction of blood flow towards the liver. Other: None. IMPRESSION: Sludge and stones within the gallbladder but without other sonographic features to suggest acute cholecystitis. No biliary dilatation. Electronically Signed   By: Donavan Foil M.D.   On:  05/12/2020 21:10     Medications:   . cefTRIAXone (ROCEPHIN)  IV 2 g (05/13/20 2101)  . lactated ringers 125 mL/hr at 05/13/20 1738   . allopurinol  300 mg Oral Daily  . amLODipine  10 mg Oral QHS  . aspirin  81 mg Oral BH-q7a  . clopidogrel  75 mg Oral Daily  . heparin injection (subcutaneous)  5,000 Units Subcutaneous Q8H  . hydrALAZINE  50 mg Oral TID  . insulin aspart  0-20 Units Subcutaneous TID WC  . insulin aspart  0-5 Units Subcutaneous QHS  . insulin aspart  10 Units Subcutaneous TID WC  . insulin glargine  40 Units Subcutaneous QHS  . isosorbide mononitrate  30 mg Oral Daily  . metoprolol tartrate  50 mg Oral BID  . sodium chloride flush  3 mL Intravenous Once   acetaminophen **OR** acetaminophen, HYDROcodone-acetaminophen, methocarbamol  Assessment/ Plan:  59 y.o. female with  Chronic kidney disease History of stroke History of detached retina Diabetes with neuropathy, retinopathy History of coronary disease with bypass in 2013   Principal Problem:   AKI (acute kidney injury) ([place]) Active Problems:   Anemia in chronic kidney disease   CKD (chronic kidney disease), stage IV (HCC)   Coronary artery disease   Type 2 diabetes mellitus with renal complication (HCC)   Obesity, Class III, BMI 40-49.9 (morbid obesity) ([place])   UTI (urinary tract infection)   #.  Acute kidney injury on CKD st 4 with rhabdomyolysis Baseline creatinine of 2.57, GFR 23 from March 27, 2020 Serum creatinine trends are summarized below Recent Labs    05/12/20 1343 05/13/20 1001 05/14/20 0537  CREATININE 4.26* 3.76* 3.60*  Serum creatinine is improving with IV hydration ARB and diuretic currently on hold Patient feels comfortable to be discharged to home Use Lasix as needed for leg edema ARB can be restarted as outpatient when creatinine is at baseline  #. Anemia of CKD  Lab Results  Component Value Date   HGB 8.9 (L) 05/14/2020    #. SHPTH  No results found for: PTH Lab  Results  Component Value Date   PHOS 5.2 (H) 05/14/2020   #. Diabetes type 2 with CKD Hgb A1c MFr Bld (%)  Date Value  05/12/2020 8.5 (H)      LOS: 0 Namir Neto 5/26/20218:14 AM  [facility] [place], [place]

## 2020-05-14 NOTE — Progress Notes (Addendum)
PROGRESS NOTE    Sharon Arias  LPF:[id number] DOB: 07/05/1961 DOA: 05/12/2020 PCP: Idelle Crouch, MD     Brief Narrative:  59 year old WF PMHx , II DM, CKD 4, anemia of chronic kidney disease, CAD, obesity.  Patient presented with right-sided flank pain.  Found to have AKI, mild rhabdo and possible UTI. Currently further plan is continue IV antibiotics.   Subjective: A/O x4, negative CP, negative abdominal pain, negative N/V.  Patient is having CVA tenderness consistent with UTI.   Assessment & Plan:   Principal Problem:   AKI (acute kidney injury) ([place]) Active Problems:   Anemia in chronic kidney disease   CKD (chronic kidney disease), stage IV (HCC)   Coronary artery disease   Type 2 diabetes mellitus with renal complication (HCC)   Obesity, Class III, BMI 40-49.9 (morbid obesity) ([place])   UTI (urinary tract infection)   Acute renal failure superimposed on stage 4 chronic kidney disease (HCC)   Enterococcus faecalis infection   Non-traumatic rhabdomyolysis   Chronic diastolic CHF (congestive heart failure) (HCC)   Bradycardia   Diabetes mellitus type 2, uncontrolled, with complications ([place])   Morbid obesity with BMI of 45.0-49.9, adult ([place])   Acute on CKD stage IV (baseline Cr 2.3-2.7) Recent Labs  Lab 05/12/20 1343 05/13/20 1001 05/14/20 0537  CREATININE 4.26* 3.76* 3.60*  -Nephrology consulted. -ARB and diuretic on hold -Normal sodium 129ml/hr -Patient is acute renal failure most likely is secondary to UTI and rhabdomyolysis. -Per Dr. Candiss Norse nephrology note patient feels comfortable being discharged but will hold overnight would like to see better improvement in her creatinine and CK level. -Avoid nephrotoxic medication. -CK elevated discontinue Lipitor  UTI. positive Enterococcus Faecalis -5/26 discontinue IV Rocephin -5/26 start 5-day course Amoxicillin   Nontraumatic rhabdomyolysis  -CK elevated. -Continue with IV hydration Recent Labs  Lab  05/13/20 1001 05/14/20 0537  CKTOTAL 2,958* 2,227*    Chronic diastolic CHF -Strict ins and outs -Daily weight -Amlodipine 10 mg daily -Hydralazine 75 mg TID -Imdur 30 mg daily -Metoprolol 50 mg BID increase metoprolol secondary to patient's bradycardia  Bradycardia -See CHF  CAD -ASA 81 mg daily -Plavix 75 mg daily  Essential HTN  -See CHF  Right-sided flank pain Likely musculoskeletal. CT renal stone study negative. Ultrasound abdomen also negative for any acute abnormality other than gallbladder sludge. LFT stable. Continue to monitor.  Anemia chronic kidney disease Recent Labs  Lab 05/12/20 1343 05/13/20 1001 05/14/20 0537  HGB 9.6* 9.5* 8.9*    DM type II uncontrolled with complication -5/32 hemoglobin A1c= 8.5 -Lantus 40 units qhs - NovoLog 10 units qac  Obesity morbid   DVT prophylaxis: Heparin Code Status: Full Family Communication:  Disposition Plan:  Status is: Inpatient    Dispo: The patient is from: Home              Anticipated d/c is to: Home              Anticipated d/c date is: 5/27 or 5/20              Patient currently unstable      Consultants:  Nephrology  Procedures/Significant Events:    I have personally reviewed and interpreted all radiology studies and my findings are as above.  VENTILATOR SETTINGS:    Cultures 5/24 urine positive Enterococcus Faecalis   Antimicrobials: Anti-infectives (From admission, onward)   Start     Dose/Rate Stop   05/14/20 1400  amoxicillin (AMOXIL) capsule 500 mg  500 mg     05/12/20 2000  cefTRIAXone (ROCEPHIN) 2 g in sodium chloride 0.9 % 100 mL IVPB  Status:  Discontinued     2 g 200 mL/hr over 30 Minutes 05/14/20 1234   05/12/20 1900  cefTRIAXone (ROCEPHIN) 2 g in sodium chloride 0.9 % 100 mL IVPB  Status:  Discontinued     2 g 200 mL/hr over 30 Minutes 05/12/20 1910       Devices    LINES / TUBES:      Continuous Infusions: . sodium chloride 75 mL/hr at  05/14/20 1727  . lactated ringers Stopped (05/14/20 1727)     Objective: Vitals:   05/14/20 0806 05/14/20 1317 05/14/20 1650 05/14/20 1727  BP: (!) 146/54 (!) 148/57 (!) 150/54   Pulse: (!) 52 (!) 56 (!) 59   Resp: 18 20 18    Temp: 98.6 F (37 C) 98.5 F (36.9 C) 99.1 F (37.3 C)   TempSrc: Oral Oral Oral   SpO2: 100% 97% 99%   Weight:    116.2 kg  Height:        Intake/Output Summary (Last 24 hours) at 05/14/2020 1733 Last data filed at 05/14/2020 1727 Gross per 24 hour  Intake 2910.84 ml  Output 3200 ml  Net -289.16 ml   Filed Weights   05/12/20 1339 05/14/20 1727  Weight: (!) 144.3 kg 116.2 kg    Examination:  General: A/O x4, no acute respiratory distress Eyes: negative scleral hemorrhage, negative anisocoria, negative icterus ENT: Negative Runny nose, negative gingival bleeding, Neck:  Negative scars, masses, torticollis, lymphadenopathy, JVD Lungs: Clear to auscultation bilaterally without wheezes or crackles Cardiovascular: Regular rate and rhythm without murmur gallop or rub normal S1 and S2 Abdomen: MORBIDLY OBESE, negative abdominal pain, nondistended, positive soft, bowel sounds, no rebound, no ascites, no appreciable mass Extremities: No significant cyanosis, clubbing, or edema bilateral lower extremities Skin: Negative rashes, lesions, ulcers Psychiatric:  Negative depression, negative anxiety, negative fatigue, negative mania  Central nervous system:  Cranial nerves II through XII intact, tongue/uvula midline, all extremities muscle strength 5/5, sensation intact throughout,  negative dysarthria, negative expressive aphasia, negative receptive aphasia.  .     Data Reviewed: Care during the described time interval was provided by me .  I have reviewed this patient's available data, including medical history, events of note, physical examination, and all test results as part of my evaluation.  CBC: Recent Labs  Lab 05/12/20 1343 05/13/20 1001  05/14/20 0537  WBC 10.6* 10.6* 9.7  NEUTROABS  --  7.1  --   HGB 9.6* 9.5* 8.9*  HCT 28.4* 27.5* 26.9*  MCV 84.5 83.8 86.8  PLT 286 274 361   Basic Metabolic Panel: Recent Labs  Lab 05/12/20 1343 05/13/20 1001 05/14/20 0537  NA 140 140 138  K 4.6 4.4 4.4  CL 112* 112* 111  CO2 16* 19* 18*  GLUCOSE 186* 220* 170*  BUN 90* 75* 78*  CREATININE 4.26* 3.76* 3.60*  CALCIUM 9.3 9.2 8.7*  PHOS  --   --  5.2*   GFR: Estimated Creatinine Clearance: 22.5 mL/min (A) (by C-G formula based on SCr of 3.6 mg/dL (H)). Liver Function Tests: Recent Labs  Lab 05/12/20 1343 05/13/20 1001 05/14/20 0537  AST 37 43*  --   ALT 63* 63*  --   ALKPHOS 65 60  --   BILITOT 0.5 0.4  --   PROT 7.7 7.6  --   ALBUMIN 3.9 3.9 3.6   Recent Labs  Lab 05/12/20 1343  LIPASE 39   No results for input(s): AMMONIA in the last 168 hours. Coagulation Profile: No results for input(s): INR, PROTIME in the last 168 hours. Cardiac Enzymes: Recent Labs  Lab 05/13/20 1001 05/14/20 0537  CKTOTAL 2,958* 2,227*   BNP (last 3 results) No results for input(s): PROBNP in the last 8760 hours. HbA1C: Recent Labs    05/12/20 1343  HGBA1C 8.5*   CBG: Recent Labs  Lab 05/13/20 1730 05/13/20 2056 05/14/20 0258 05/14/20 0855 05/14/20 1253  GLUCAP 195* 234* 74 114* 161*   Lipid Profile: No results for input(s): CHOL, HDL, LDLCALC, TRIG, CHOLHDL, LDLDIRECT in the last 72 hours. Thyroid Function Tests: No results for input(s): TSH, T4TOTAL, FREET4, T3FREE, THYROIDAB in the last 72 hours. Anemia Panel: No results for input(s): VITAMINB12, FOLATE, FERRITIN, TIBC, IRON, RETICCTPCT in the last 72 hours. Sepsis Labs: No results for input(s): PROCALCITON, LATICACIDVEN in the last 168 hours.  Recent Results (from the past 240 hour(s))  Urine Culture     Status: Abnormal (Preliminary result)   Collection Time: 05/12/20  1:43 PM   Specimen: Urine, Random  Result Value Ref Range Status   Specimen  Description   Final    URINE, RANDOM Performed at [facility], [address], [place], [place] [address]    Special Requests   Final    NONE Performed at [facility], [address] [place], [place] [address]    Culture >=100,000 COLONIES/mL ENTEROCOCCUS FAECALIS (A)  Final   Report Status PENDING  Incomplete  SARS Coronavirus 2 by RT PCR (hospital order, performed in [place] hospital lab) Nasopharyngeal Nasopharyngeal Swab     Status: None   Collection Time: 05/12/20  5:41 PM   Specimen: Nasopharyngeal Swab  Result Value Ref Range Status   SARS Coronavirus 2 NEGATIVE NEGATIVE Final    Comment: (NOTE) SARS-CoV-2 target nucleic acids are NOT DETECTED. The SARS-CoV-2 RNA is generally detectable in upper and lower respiratory specimens during the acute phase of infection. The lowest concentration of SARS-CoV-2 viral copies this assay can detect is 250 copies / mL. A negative result does not preclude SARS-CoV-2 infection and should not be used as the sole basis for treatment or other patient management decisions.  A negative result may occur with improper specimen collection / handling, submission of specimen other than nasopharyngeal swab, presence of viral mutation(s) within the areas targeted by this assay, and inadequate number of viral copies (<250 copies / mL). A negative result must be combined with clinical observations, patient history, and epidemiological information. Fact Sheet for Patients:   StrictlyIdeas.no Fact Sheet for Healthcare Providers: BankingDealers.co.za This test is not yet approved or cleared  by the [place] FDA and has been authorized for detection and/or diagnosis of SARS-CoV-2 by FDA under an Emergency Use Authorization (EUA).  This EUA will remain in effect (meaning this test can be used) for the duration of the COVID-19 declaration under Section 564(b)(1) of the Act, 21  U.S.C. section 360bbb-3(b)(1), unless the authorization is terminated or revoked sooner. Performed at [facility], [address] [place], [place] [address]          Radiology Studies: CT Renal Stone Study  Result Date: 05/12/2020 CLINICAL DATA:  Lower back and lower abdominal pain, urinary urgency and frequency, intermittent nausea EXAM: CT ABDOMEN AND PELVIS WITHOUT CONTRAST TECHNIQUE: Multidetector CT imaging of the abdomen and pelvis was performed following the standard protocol without IV contrast. COMPARISON:  12/11/2018  FINDINGS: Lower chest: No acute pleural or parenchymal lung disease. Hepatobiliary: Numerous calcified gallstones are again identified, with no evidence of acute cholecystitis. Enhanced imaging of the liver is unremarkable. Pancreas: Unremarkable. No pancreatic ductal dilatation or surrounding inflammatory changes. Spleen: Normal in size without focal abnormality. Adrenals/Urinary Tract: No urinary tract calculi or obstructive uropathy within either kidney. Bilateral renal cortical atrophy unchanged. The adrenals are unremarkable. Bladder is minimally distended with no focal abnormality. Stomach/Bowel: No bowel obstruction or ileus. Normal appendix right lower quadrant. No bowel wall thickening or inflammatory change. Vascular/Lymphatic: There is severe atherosclerosis of the aorta and its distal branches. No pathologic adenopathy. Reproductive: Uterus and bilateral adnexa are unremarkable. Other: No abdominal wall hernia or abnormality. No abdominopelvic ascites. Musculoskeletal: No acute or destructive bony lesions. Reconstructed images demonstrate no additional findings. IMPRESSION: 1. No urinary tract calculi or obstructive uropathy. 2. Cholelithiasis without evidence of acute cholecystitis. 3. Aortic Atherosclerosis (ICD10-I70.0). Electronically Signed   By: Randa Ngo M.D.   On: 05/12/2020 18:36   US Abdomen Limited RUQ  Result Date: 05/12/2020 CLINICAL  DATA:  Right-sided pain EXAM: ULTRASOUND ABDOMEN LIMITED RIGHT UPPER QUADRANT COMPARISON:  CT 05/12/2020 FINDINGS: Gallbladder: Sludge and small stones within the gallbladder. Normal wall thickness. Negative sonographic Murphy. Nodular echogenic foci along the anterior wall of gallbladder, possible cholesterolosis. Common bile duct: Diameter: 1.4 mm Liver: No focal lesion identified. Within normal limits in parenchymal echogenicity. Portal vein is patent on color Doppler imaging with normal direction of blood flow towards the liver. Other: None. IMPRESSION: Sludge and stones within the gallbladder but without other sonographic features to suggest acute cholecystitis. No biliary dilatation. Electronically Signed   By: Donavan Foil M.D.   On: 05/12/2020 21:10        Scheduled Meds: . allopurinol  300 mg Oral Daily  . amLODipine  10 mg Oral QHS  . amoxicillin  500 mg Oral Q8H  . aspirin  81 mg Oral [id number]  . clopidogrel  75 mg Oral Daily  . heparin injection (subcutaneous)  5,000 Units Subcutaneous Q8H  . hydrALAZINE  75 mg Oral TID  . insulin aspart  0-20 Units Subcutaneous TID WC  . insulin aspart  0-5 Units Subcutaneous QHS  . insulin aspart  10 Units Subcutaneous TID WC  . insulin glargine  40 Units Subcutaneous QHS  . isosorbide mononitrate  30 mg Oral Daily  . metoprolol tartrate  50 mg Oral BID  . sodium chloride flush  3 mL Intravenous Once   Continuous Infusions: . sodium chloride 75 mL/hr at 05/14/20 1727  . lactated ringers Stopped (05/14/20 1727)     LOS: 0 days    Time spent:40 min    Lannette Avellino, Geraldo Docker, MD [facility] Pager [phone]  If 7PM-7AM, please contact night-coverage [email or web address] Password [facility] 05/14/2020, 5:33 PM

## 2020-05-15 ENCOUNTER — Ambulatory Visit: Payer: Medicare Other

## 2020-05-15 DIAGNOSIS — E1121 Type 2 diabetes mellitus with diabetic nephropathy: Secondary | ICD-10-CM

## 2020-05-15 DIAGNOSIS — N3000 Acute cystitis without hematuria: Secondary | ICD-10-CM

## 2020-05-15 LAB — URINE CULTURE: Culture: >=100000 — AB

## 2020-05-15 LAB — CBC
HCT: 26.3 % — ABNORMAL LOW (ref 36.0–46.0)
Hemoglobin: 8.6 g/dL — ABNORMAL LOW (ref 12.0–15.0)
MCH: 28.8 pg (ref 26.0–34.0)
MCHC: 32.7 g/dL (ref 30.0–36.0)
MCV: 88 fL (ref 80.0–100.0)
Platelets: 260 10*3/uL (ref 150–400)
RBC: 2.99 MIL/uL — ABNORMAL LOW (ref 3.87–5.11)
RDW: 15 % (ref 11.5–15.5)
WBC: 10.5 10*3/uL (ref 4.0–10.5)
nRBC: 0 % (ref 0.0–0.2)

## 2020-05-15 LAB — COMPREHENSIVE METABOLIC PANEL
ALT: 56 U/L — ABNORMAL HIGH (ref 0–44)
AST: 34 U/L (ref 15–41)
Albumin: 3.5 g/dL (ref 3.5–5.0)
Alkaline Phosphatase: 54 U/L (ref 38–126)
Anion gap: 11 (ref 5–15)
BUN: 74 mg/dL — ABNORMAL HIGH (ref 6–20)
CO2: 17 mmol/L — ABNORMAL LOW (ref 22–32)
Calcium: 9 mg/dL (ref 8.9–10.3)
Chloride: 112 mmol/L — ABNORMAL HIGH (ref 98–111)
Creatinine, Ser: 3.49 mg/dL — ABNORMAL HIGH (ref 0.44–1.00)
GFR calc Af Amer: 16 mL/min — ABNORMAL LOW (ref >60)
GFR calc non Af Amer: 14 mL/min — ABNORMAL LOW (ref >60)
Glucose, Bld: 160 mg/dL — ABNORMAL HIGH (ref 70–99)
Potassium: 4.6 mmol/L (ref 3.5–5.1)
Sodium: 140 mmol/L (ref 135–145)
Total Bilirubin: 0.5 mg/dL (ref 0.3–1.2)
Total Protein: 7.1 g/dL (ref 6.5–8.1)

## 2020-05-15 LAB — GLUCOSE, CAPILLARY
Glucose-Capillary: 154 mg/dL — ABNORMAL HIGH (ref 70–99)
Glucose-Capillary: 158 mg/dL — ABNORMAL HIGH (ref 70–99)

## 2020-05-15 LAB — CK: Total CK: 1976 U/L — ABNORMAL HIGH (ref 38–234)

## 2020-05-15 LAB — PHOSPHORUS: Phosphorus: 5.3 mg/dL — ABNORMAL HIGH (ref 2.5–4.6)

## 2020-05-15 MED ORDER — ISOSORBIDE MONONITRATE ER 30 MG PO TB24: 30.0000 mg | ORAL_TABLET | Freq: Every day | ORAL | 0 refills | Status: DC

## 2020-05-15 MED ORDER — METHOCARBAMOL 500 MG PO TABS: 500.0000 mg | ORAL_TABLET | Freq: Three times a day (TID) | ORAL | 0 refills | Status: DC | PRN

## 2020-05-15 MED ORDER — AMOXICILLIN 500 MG PO CAPS: 500.0000 mg | ORAL_CAPSULE | Freq: Three times a day (TID) | ORAL | 0 refills | Status: DC

## 2020-05-15 MED ORDER — HYDRALAZINE HCL 25 MG PO TABS: 75.0000 mg | ORAL_TABLET | Freq: Three times a day (TID) | ORAL | 0 refills | Status: DC

## 2020-05-15 MED ORDER — METOPROLOL TARTRATE 50 MG PO TABS: 50.0000 mg | ORAL_TABLET | Freq: Two times a day (BID) | ORAL | 0 refills | Status: DC

## 2020-05-15 NOTE — Discharge Summary (Signed)
Physician Discharge Summary  Sharon Arias JQB:[id number] DOB: October 16, 1961 DOA: 05/12/2020  PCP: Idelle Crouch, MD  Admit date: 05/12/2020 Discharge date: 05/15/2020   Time spent: 30 minutes  Recommendations for Outpatient Follow-up:   Acute on CKD stage IV (baseline Cr 2.3-2.7) Recent Labs  Lab 05/12/20 1343 05/13/20 1001 05/14/20 0537 05/15/20 0512  CREATININE 4.26* 3.76* 3.60* 3.49*  -Nephrology consulted. -ARB and diuretic on hold -Patient is acute renal failure most likely is secondary to UTI and rhabdomyolysis. -Per Dr. Candiss Norse nephrology note patient feels comfortable being discharged but will hold overnight would like to see better improvement in her creatinine and CK level. -Avoid nephrotoxic medication. -CK elevated discontinue Lipitor  UTI. positive Enterococcus Faecalis -5/26 discontinue IV Rocephin -5/26 start 5-day course Amoxicillin   Nontraumatic rhabdomyolysis  -CK elevated. -Continue with IV hydration Recent Labs  Lab 05/13/20 1001 05/14/20 0537 05/15/20 0512  CKTOTAL 2,958* 2,227* 1,976*  -Patient CK remains elevated but continues to trend down patient felt comfortable being discharged.   Chronic diastolic CHF -Strict ins and outs -3.0 L -Daily weight Filed Weights   05/12/20 1339 05/14/20 1727  Weight: (!) 144.3 kg 116.2 kg  -Amlodipine 10 mg daily -Hydralazine 75 mg TID -Imdur 30 mg daily -Metoprolol 50 mg BID increase metoprolol secondary to patient's bradycardia -Ensure you follow-up with cardiology on 6/1@1330 .  They will need to check your labs to ensure renal function has continued to improve and CK has continued to normalize  Bradycardia -See CHF  CAD -ASA 81 mg daily -Plavix 75 mg daily  Essential HTN  -See CHF  Right-sided flank pain Likely musculoskeletal. CT renal stone study negative. Ultrasound abdomen also negative for any acute abnormality other than gallbladder sludge. LFT stable. Continue to  monitor.  Anemia chronic kidney disease Recent Labs  Lab 05/12/20 1343 05/13/20 1001 05/14/20 0537 05/15/20 0512  HGB 9.6* 9.5* 8.9* 8.6*  -Stable]  DM type II uncontrolled with complication -4/09 hemoglobin A1c= 8.5 -Return to home dose medication  Lantus 80 units qhs  Humalog per home instructions  Obesity morbid -Schedule follow-up appointment in 2 weeks with Dr. Fulton Reek, DM type II uncontrolled with complication, morbid obesity, essential HTN.    Discharge Diagnoses:  Principal Problem:   AKI (acute kidney injury) ([place]) Active Problems:   Anemia in chronic kidney disease   CKD (chronic kidney disease), stage IV (HCC)   Coronary artery disease   Type 2 diabetes mellitus with renal complication (HCC)   Obesity, Class III, BMI 40-49.9 (morbid obesity) ([place])   UTI (urinary tract infection)   Acute renal failure superimposed on stage 4 chronic kidney disease (HCC)   Enterococcus faecalis infection   Non-traumatic rhabdomyolysis   Chronic diastolic CHF (congestive heart failure) (HCC)   Bradycardia   Diabetes mellitus type 2, uncontrolled, with complications ([place])   Morbid obesity with BMI of 45.0-49.9, adult ([place])   Rhabdomyolysis   Discharge Condition: Stable  Diet recommendation: Heart healthy/carb modified  Filed Weights   05/12/20 1339 05/14/20 1727  Weight: (!) 144.3 kg 116.2 kg    History of present illness:  59 year old WF PMHx , II DM, CKD 4, anemia of chronic kidney disease, CAD, obesity. Patient presented with right-sided flank pain. Found to have AKI, mild rhabdo and possible UTI. Currently further plan iscontinue IV antibiotics.  Hospital Course:  See above   Consultations: Nephrology  Cultures  5/24 urine positive Enterococcus Faecalis   Antibiotics Anti-infectives (From admission, onward)   Start  Dose/Rate Stop   05/15/20 0000  amoxicillin (AMOXIL) 500 MG capsule     500 mg     05/14/20 1400  amoxicillin (AMOXIL)  capsule 500 mg     500 mg     05/12/20 2000  cefTRIAXone (ROCEPHIN) 2 g in sodium chloride 0.9 % 100 mL IVPB  Status:  Discontinued     2 g 200 mL/hr over 30 Minutes 05/14/20 1234   05/12/20 1900  cefTRIAXone (ROCEPHIN) 2 g in sodium chloride 0.9 % 100 mL IVPB  Status:  Discontinued     2 g 200 mL/hr over 30 Minutes 05/12/20 1910       Discharge Exam: Vitals:   05/15/20 0000 05/15/20 0400 05/15/20 0749 05/15/20 1015  BP: (!) 129/57 (!) 147/54 140/60 (!) 155/61  Pulse: (!) 57 (!) 57 60 61  Resp: 19 19 20    Temp: 98.3 F (36.8 C) 98 F (36.7 C) 98 F (36.7 C)   TempSrc: Oral Oral Oral   SpO2:  100% 100%   Weight:      Height:        General: A/O x4, no acute respiratory distress Eyes: negative scleral hemorrhage, negative anisocoria, negative icterus ENT: Negative Runny nose, negative gingival bleeding, Neck:  Negative scars, masses, torticollis, lymphadenopathy, JVD Lungs: Clear to auscultation bilaterally without wheezes or crackles Cardiovascular: Regular rate and rhythm without murmur gallop or rub normal S1 and S2 Abdomen: MORBIDLY OBESE, negative abdominal pain, nondistended, positive soft, bowel sounds, no rebound, no ascites, no appreciable mass   Discharge Instructions   Allergies as of 05/15/2020      Reactions   Ozempic (0.25 Or 0.5 Mg-dose) [semaglutide(0.25 Or 0.5mg -dos)] Diarrhea, Nausea Only   Trulicity [dulaglutide] Diarrhea, Nausea Only      Medication List    STOP taking these medications   Cholecalciferol 10 MCG (400 UNIT) Caps   furosemide 20 MG tablet Commonly known as: LASIX   losartan 100 MG tablet Commonly known as: COZAAR   predniSONE 10 MG tablet Commonly known as: DELTASONE     TAKE these medications   acetaminophen 500 MG tablet Commonly known as: TYLENOL Take 1,000 mg by mouth every 8 (eight) hours as needed for pain.   allopurinol 300 MG tablet Commonly known as: ZYLOPRIM Take 300 mg by mouth daily.   amLODipine 10 MG  tablet Commonly known as: NORVASC Take 10 mg by mouth at bedtime.   amoxicillin 500 MG capsule Commonly known as: AMOXIL Take 1 capsule (500 mg total) by mouth every 8 (eight) hours.   aspirin EC 81 MG tablet Take 81 mg by mouth every morning.   clopidogrel 75 MG tablet Commonly known as: PLAVIX Take 1 tablet (75 mg total) by mouth daily.   hydrALAZINE 25 MG tablet Commonly known as: APRESOLINE Take 3 tablets (75 mg total) by mouth 3 (three) times daily.   insulin lispro 100 UNIT/ML KwikPen Commonly known as: HUMALOG Inject 15-30 Units into the skin See admin instructions. Pt. Uses 15 un/ml in the mornings, 25un/ml in the afternoon, and 30 un/ml at bedtime   isosorbide mononitrate 30 MG 24 hr tablet Commonly known as: IMDUR Take 1 tablet (30 mg total) by mouth daily. Start taking on: May 16, 2020   Lantus SoloStar 100 UNIT/ML Solostar Pen Generic drug: insulin glargine Inject 80 Units into the skin at bedtime.   methocarbamol 500 MG tablet Commonly known as: ROBAXIN Take 1 tablet (500 mg total) by mouth every 8 (eight) hours as needed for  muscle spasms.   metoprolol tartrate 50 MG tablet Commonly known as: LOPRESSOR Take 1 tablet (50 mg total) by mouth 2 (two) times daily. What changed:   medication strength  how much to take  when to take this   rosuvastatin 20 MG tablet Commonly known as: CRESTOR Take 20 mg by mouth at bedtime.      Allergies  Allergen Reactions  . Ozempic (0.25 Or 0.5 Mg-Dose) [Semaglutide(0.25 Or 0.5mg -Dos)] Diarrhea and Nausea Only  . Trulicity [Dulaglutide] Diarrhea and Nausea Only   Follow-up Information    [facility] Follow up on 05/20/2020.   Specialty: Cardiology Why: at 1:30pm. Enter through the [place] entrance Contact information: [address] [place] [phone]       Idelle Crouch, MD. Schedule an appointment as soon as  possible for a visit in 2 week(s).   Specialty: Internal Medicine Why: Schedule follow-up appointment in 2 weeks with Dr. Fulton Reek, DM type II uncontrolled with complication, morbid obesity, essential HTN. Contact information: [place] [address] [phone]            The results of significant diagnostics from this hospitalization (including imaging, microbiology, ancillary and laboratory) are listed below for reference.    Significant Diagnostic Studies: CT Renal Stone Study  Result Date: 05/12/2020 CLINICAL DATA:  Lower back and lower abdominal pain, urinary urgency and frequency, intermittent nausea EXAM: CT ABDOMEN AND PELVIS WITHOUT CONTRAST TECHNIQUE: Multidetector CT imaging of the abdomen and pelvis was performed following the standard protocol without IV contrast. COMPARISON:  12/11/2018 FINDINGS: Lower chest: No acute pleural or parenchymal lung disease. Hepatobiliary: Numerous calcified gallstones are again identified, with no evidence of acute cholecystitis. Enhanced imaging of the liver is unremarkable. Pancreas: Unremarkable. No pancreatic ductal dilatation or surrounding inflammatory changes. Spleen: Normal in size without focal abnormality. Adrenals/Urinary Tract: No urinary tract calculi or obstructive uropathy within either kidney. Bilateral renal cortical atrophy unchanged. The adrenals are unremarkable. Bladder is minimally distended with no focal abnormality. Stomach/Bowel: No bowel obstruction or ileus. Normal appendix right lower quadrant. No bowel wall thickening or inflammatory change. Vascular/Lymphatic: There is severe atherosclerosis of the aorta and its distal branches. No pathologic adenopathy. Reproductive: Uterus and bilateral adnexa are unremarkable. Other: No abdominal wall hernia or abnormality. No abdominopelvic ascites. Musculoskeletal: No acute or destructive bony lesions. Reconstructed images demonstrate no  additional findings. IMPRESSION: 1. No urinary tract calculi or obstructive uropathy. 2. Cholelithiasis without evidence of acute cholecystitis. 3. Aortic Atherosclerosis (ICD10-I70.0). Electronically Signed   By: Randa Ngo M.D.   On: 05/12/2020 18:36   US Abdomen Limited RUQ  Result Date: 05/12/2020 CLINICAL DATA:  Right-sided pain EXAM: ULTRASOUND ABDOMEN LIMITED RIGHT UPPER QUADRANT COMPARISON:  CT 05/12/2020 FINDINGS: Gallbladder: Sludge and small stones within the gallbladder. Normal wall thickness. Negative sonographic Murphy. Nodular echogenic foci along the anterior wall of gallbladder, possible cholesterolosis. Common bile duct: Diameter: 1.4 mm Liver: No focal lesion identified. Within normal limits in parenchymal echogenicity. Portal vein is patent on color Doppler imaging with normal direction of blood flow towards the liver. Other: None. IMPRESSION: Sludge and stones within the gallbladder but without other sonographic features to suggest acute cholecystitis. No biliary dilatation. Electronically Signed   By: Donavan Foil M.D.   On: 05/12/2020 21:10    Microbiology: Recent Results (from the past 240 hour(s))  Urine Culture     Status: Abnormal  Collection Time: 05/12/20  1:43 PM   Specimen: Urine, Random  Result Value Ref Range Status   Specimen Description   Final    URINE, RANDOM Performed at [facility], [address] [place], [place] [address]    Special Requests   Final    NONE Performed at [facility], [address] [place], [place] [address]    Culture >=100,000 COLONIES/mL ENTEROCOCCUS FAECALIS (A)  Final   Report Status 05/15/2020 FINAL  Final   Organism ID, Bacteria ENTEROCOCCUS FAECALIS (A)  Final      Susceptibility   Enterococcus faecalis - MIC*    AMPICILLIN <=2 SENSITIVE Sensitive     NITROFURANTOIN <=16 SENSITIVE Sensitive     VANCOMYCIN 1 SENSITIVE Sensitive     * >=100,000 COLONIES/mL ENTEROCOCCUS FAECALIS  SARS Coronavirus  2 by RT PCR (hospital order, performed in [place] hospital lab) Nasopharyngeal Nasopharyngeal Swab     Status: None   Collection Time: 05/12/20  5:41 PM   Specimen: Nasopharyngeal Swab  Result Value Ref Range Status   SARS Coronavirus 2 NEGATIVE NEGATIVE Final    Comment: (NOTE) SARS-CoV-2 target nucleic acids are NOT DETECTED. The SARS-CoV-2 RNA is generally detectable in upper and lower respiratory specimens during the acute phase of infection. The lowest concentration of SARS-CoV-2 viral copies this assay can detect is 250 copies / mL. A negative result does not preclude SARS-CoV-2 infection and should not be used as the sole basis for treatment or other patient management decisions.  A negative result may occur with improper specimen collection / handling, submission of specimen other than nasopharyngeal swab, presence of viral mutation(s) within the areas targeted by this assay, and inadequate number of viral copies (<250 copies / mL). A negative result must be combined with clinical observations, patient history, and epidemiological information. Fact Sheet for Patients:   StrictlyIdeas.no Fact Sheet for Healthcare Providers: BankingDealers.co.za This test is not yet approved or cleared  by the [place] FDA and has been authorized for detection and/or diagnosis of SARS-CoV-2 by FDA under an Emergency Use Authorization (EUA).  This EUA will remain in effect (meaning this test can be used) for the duration of the COVID-19 declaration under Section 564(b)(1) of the Act, 21 U.S.C. section 360bbb-3(b)(1), unless the authorization is terminated or revoked sooner. Performed at [facility], [address] [place], [place] [address]      Labs: Basic Metabolic Panel: Recent Labs  Lab 05/12/20 1343 05/13/20 1001 05/14/20 0537 05/15/20 0512  NA 140 140 138 140  K 4.6 4.4 4.4 4.6  CL 112* 112* 111 112*  CO2 16*  19* 18* 17*  GLUCOSE 186* 220* 170* 160*  BUN 90* 75* 78* 74*  CREATININE 4.26* 3.76* 3.60* 3.49*  CALCIUM 9.3 9.2 8.7* 9.0  PHOS  --   --  5.2* 5.3*   Liver Function Tests: Recent Labs  Lab 05/12/20 1343 05/13/20 1001 05/14/20 0537 05/15/20 0512  AST 37 43*  --  34  ALT 63* 63*  --  56*  ALKPHOS 65 60  --  54  BILITOT 0.5 0.4  --  0.5  PROT 7.7 7.6  --  7.1  ALBUMIN 3.9 3.9 3.6 3.5   Recent Labs  Lab 05/12/20 1343  LIPASE 39   No results for input(s): AMMONIA in the last 168 hours. CBC: Recent Labs  Lab 05/12/20 1343 05/13/20 1001 05/14/20 0537 05/15/20 0512  WBC 10.6* 10.6* 9.7 10.5  NEUTROABS  --  7.1  --   --  HGB 9.6* 9.5* 8.9* 8.6*  HCT 28.4* 27.5* 26.9* 26.3*  MCV 84.5 83.8 86.8 88.0  PLT 286 274 250 260   Cardiac Enzymes: Recent Labs  Lab 05/13/20 1001 05/14/20 0537 05/15/20 0512  CKTOTAL 2,958* 2,227* 1,976*   BNP: BNP (last 3 results) Recent Labs    03/18/20 1212  BNP 851.0*    ProBNP (last 3 results) No results for input(s): PROBNP in the last 8760 hours.  CBG: Recent Labs  Lab 05/14/20 0855 05/14/20 1253 05/14/20 1738 05/14/20 2215 05/15/20 0758  GLUCAP 114* 161* 199* 159* 154*       Signed:  Dia Crawford, MD [facility] [phone] pager

## 2020-05-15 NOTE — Progress Notes (Signed)
Patient discharged home. Discharge instructions and prescriptions given and reviewed with patient. Patient verbalized understanding. Escorted out by staff.  Pt also given written prescriptions for pharmacy.

## 2020-05-18 NOTE — Progress Notes (Deleted)
Patient ID: Sharon Arias, female    DOB: 15-May-1961, 59 y.o.   MRN: [id number]  HPI  Sharon Arias is a 59 y/o female with a history of CAD, DM, hyperlipidemia, HTN, CKD, stroke, detached retina, MI, obesity, anemia and chronic heart failure.   Echo report from 03/18/20 reviewed and showed an EF of 55-60% along with trivial TR.  Admitted 05/12/20 due to acute on chronic CKD along with UTI. Nephrology consulted. ARB and diuretic held. Given antibiotics for UTI. IVF given for rhabdo. Discharged after 3 days. Admitted 03/18/20 due to acute on chronic HF and acute right pontine infarct. Cardiology and neurology consults obtained. Initially given IV lasix and then transitioned to oral diuretics. Diuretics were held shortly and IVF given to due AKI. Found to have 70% stenosis in right anterior ICA.  Discharged after 4 days.   She presents today for a follow-up visit with a chief complaint of   She has great difficulty with her vision due to her DM and has to take pictures of instructions and then enlarge it on her phone so that she can read it. May need dialysis in the future and wants to do it at home but doesn't think she will be able to see well enough and her husband has stage IV sarcoma and says that "realistically, he probably won't be around long".   Past Medical History:  Diagnosis Date   Anemia in chronic kidney disease 05/19/2016   CAD (coronary artery disease)    CHF (congestive heart failure) (HCC)    Chicken pox    Chronic kidney disease    per Sharon Arias   CVA (cerebral vascular accident) (Sharon Arias)    Detached retina    Diabetes mellitus without complication (Sharon Arias)    type 2   Diabetic neuropathy (Sharon Arias)    Diabetic retinopathy (Sharon Arias)    legally blind   Hyperlipidemia    Hypertension    Myocardial infarction (Sharon Arias)    Obesity    PONV (postoperative nausea and vomiting)    Sciatica of right side    going to see physiciatry   Past Surgical History:  Procedure Laterality  Date   COLONOSCOPY WITH PROPOFOL N/A 01/26/2016   Procedure: COLONOSCOPY WITH PROPOFOL;  Surgeon: Sharon Sails, MD;  Location: [facility] ENDOSCOPY;  Service: Endoscopy;  Laterality: N/A;   COLONOSCOPY WITH PROPOFOL N/A 01/27/2016   Procedure: COLONOSCOPY WITH PROPOFOL;  Surgeon: Sharon Sails, MD;  Location: [facility] ENDOSCOPY;  Service: Endoscopy;  Laterality: N/A;   CORONARY ARTERY BYPASS GRAFT  2013   INCISION AND DRAINAGE     chest abscess   INCISION AND DRAINAGE ABSCESS N/A 12/11/2018   Procedure: INCISION AND DRAINAGE PERINEAL;  Surgeon: Sharon Husbands, MD;  Location: [facility] ORS;  Service: General;  Laterality: N/A;   RETINAL LASER PROCEDURE     Family History  Problem Relation Age of Onset   Breast cancer Mother    Prostate cancer Father        we think mets to liver and bone   Social History   Tobacco Use   Smoking status: Never Smoker   Smokeless tobacco: Never Used  Substance Use Topics   Alcohol use: No   Allergies  Allergen Reactions   Ozempic (0.25 Or 0.5 Mg-Dose) [Semaglutide(0.25 Or 0.5mg -Dos)] Diarrhea and Nausea Only   Trulicity [Dulaglutide] Diarrhea and Nausea Only      Review of Systems  Constitutional: Positive for fatigue (all the time). Negative for appetite change.  HENT:  Positive for congestion. Negative for postnasal drip and sore throat.   Eyes: Positive for visual disturbance (great vision loss).  Respiratory: Positive for shortness of breath (with moderate exertion). Negative for cough and chest tightness.   Cardiovascular: Positive for leg swelling. Negative for chest pain and palpitations.  Gastrointestinal: Positive for abdominal distention (at times). Negative for abdominal pain.  Genitourinary: Negative.   Musculoskeletal: Positive for back pain (chronic low back pain). Negative for neck pain.  Skin: Negative.   Allergic/Immunologic: Negative.   Neurological: Negative for dizziness and light-headedness.  Hematological: Negative  for adenopathy. Does not bruise/bleed easily.  Psychiatric/Behavioral: Positive for sleep disturbance (sleeping on 1 pillow with CPAP). Negative for dysphoric mood. The patient is not nervous/anxious.       Physical Exam Vitals and nursing note reviewed.  Constitutional:      Appearance: Normal appearance.  HENT:     Head: Normocephalic and atraumatic.  Cardiovascular:     Rate and Rhythm: Regular rhythm. Bradycardia present.  Pulmonary:     Effort: Pulmonary effort is normal. No respiratory distress.     Breath sounds: No wheezing or rales.  Abdominal:     General: Abdomen is flat. There is no distension.     Palpations: Abdomen is soft.  Musculoskeletal:        General: No tenderness.     Cervical back: Normal range of motion and neck supple.     Right lower leg: Edema (1+ pitting) present.     Left lower leg: Edema (1+ pitting) present.  Skin:    General: Skin is warm and dry.  Neurological:     Mental Status: She is alert and oriented to person, place, and time. Mental status is at baseline.  Psychiatric:        Mood and Affect: Mood normal.        Behavior: Behavior normal.        Thought Content: Thought content normal.    Assessment & Plan:  1: Chronic heart failure with preserved ejection fraction (no structural changes)- - NYHA class III - euvolemic today - weighing daily and says that her weight has been stable; reminded to call for an overnight weight gain of >2 pounds or a weekly weight gain of >5 pounds - weight  - does not add salt and is using Mrs Sharon Arias for sodium - saw cardiology (Sharon Arias) 02/29/20 - BNP 03/18/20 was 851.0 - has received her 1st COVID vaccine   2: HTN- - BP - saw PCP (Arias) 04/23/20 - BMP 05/15/20 reviewed and showed sodium 140, potassium 4.6, creatinine 3.49 and GFR 14  3: DM with CKD- - glucose at home today was  - saw nephrology Sharon Arias) 04/02/20 - discussion has been had about future dialysis - A1c 05/12/20 was 8.5%  4:  Lymphedema- - stage 2 - limited in her ability to exercise due to vision difficulties - does wear compression socks but doesn't have them on today; showed patient how to feel for indent as she can't see if she has swelling; bases it on how her shoes fit - does elevate her legs when sitting for long periods of time - consider lymphapress compression boots if edema persists   Patient did not bring her medications nor a list. Each medication was verbally reviewed with the patient and she was encouraged to bring the bottles to every visit to confirm accuracy of list.

## 2020-05-20 ENCOUNTER — Emergency Department: Payer: Medicare Other

## 2020-05-20 ENCOUNTER — Ambulatory Visit: Payer: Medicare Other

## 2020-05-20 ENCOUNTER — Other Ambulatory Visit: Payer: Self-pay

## 2020-05-20 ENCOUNTER — Inpatient Hospital Stay
Admission: EM | Admit: 2020-05-20 | Discharge: 2020-05-24 | DRG: 291 | Disposition: A | Discharge status: Home / Self Care | Payer: Medicare Other | Attending: Internal Medicine | Admitting: Internal Medicine

## 2020-05-20 ENCOUNTER — Ambulatory Visit: Payer: Medicare Other | Admitting: Family

## 2020-05-20 DIAGNOSIS — I132 Hypertensive heart and chronic kidney disease with heart failure and with stage 5 chronic kidney disease, or end stage renal disease: Secondary | ICD-10-CM | POA: Diagnosis not present

## 2020-05-20 DIAGNOSIS — I2510 Atherosclerotic heart disease of native coronary artery without angina pectoris: Secondary | ICD-10-CM | POA: Diagnosis present

## 2020-05-20 DIAGNOSIS — E11319 Type 2 diabetes mellitus with unspecified diabetic retinopathy without macular edema: Secondary | ICD-10-CM | POA: Diagnosis not present

## 2020-05-20 DIAGNOSIS — N179 Acute kidney failure, unspecified: Secondary | ICD-10-CM | POA: Diagnosis present

## 2020-05-20 DIAGNOSIS — Z8673 Personal history of transient ischemic attack (TIA), and cerebral infarction without residual deficits: Secondary | ICD-10-CM

## 2020-05-20 DIAGNOSIS — D631 Anemia in chronic kidney disease: Secondary | ICD-10-CM | POA: Diagnosis present

## 2020-05-20 DIAGNOSIS — N184 Chronic kidney disease, stage 4 (severe): Secondary | ICD-10-CM

## 2020-05-20 DIAGNOSIS — R0602 Shortness of breath: Secondary | ICD-10-CM

## 2020-05-20 DIAGNOSIS — I252 Old myocardial infarction: Secondary | ICD-10-CM

## 2020-05-20 DIAGNOSIS — N186 End stage renal disease: Secondary | ICD-10-CM | POA: Diagnosis present

## 2020-05-20 DIAGNOSIS — E785 Hyperlipidemia, unspecified: Secondary | ICD-10-CM | POA: Diagnosis present

## 2020-05-20 DIAGNOSIS — E1122 Type 2 diabetes mellitus with diabetic chronic kidney disease: Secondary | ICD-10-CM | POA: Diagnosis present

## 2020-05-20 DIAGNOSIS — I248 Other forms of acute ischemic heart disease: Secondary | ICD-10-CM | POA: Diagnosis present

## 2020-05-20 DIAGNOSIS — Z20822 Contact with and (suspected) exposure to covid-19: Secondary | ICD-10-CM | POA: Diagnosis present

## 2020-05-20 DIAGNOSIS — Z79899 Other long term (current) drug therapy: Secondary | ICD-10-CM | POA: Diagnosis not present

## 2020-05-20 DIAGNOSIS — I25810 Atherosclerosis of coronary artery bypass graft(s) without angina pectoris: Secondary | ICD-10-CM | POA: Diagnosis present

## 2020-05-20 DIAGNOSIS — E1140 Type 2 diabetes mellitus with diabetic neuropathy, unspecified: Secondary | ICD-10-CM | POA: Diagnosis not present

## 2020-05-20 DIAGNOSIS — N2581 Secondary hyperparathyroidism of renal origin: Secondary | ICD-10-CM | POA: Diagnosis present

## 2020-05-20 DIAGNOSIS — Z888 Allergy status to other drugs, medicaments and biological substances status: Secondary | ICD-10-CM

## 2020-05-20 DIAGNOSIS — Z6841 Body Mass Index (BMI) 40.0 and over, adult: Secondary | ICD-10-CM

## 2020-05-20 DIAGNOSIS — Z803 Family history of malignant neoplasm of breast: Secondary | ICD-10-CM

## 2020-05-20 DIAGNOSIS — E872 Acidosis: Secondary | ICD-10-CM | POA: Diagnosis present

## 2020-05-20 DIAGNOSIS — Z955 Presence of coronary angioplasty implant and graft: Secondary | ICD-10-CM

## 2020-05-20 DIAGNOSIS — J9811 Atelectasis: Secondary | ICD-10-CM | POA: Diagnosis present

## 2020-05-20 DIAGNOSIS — E1129 Type 2 diabetes mellitus with other diabetic kidney complication: Secondary | ICD-10-CM | POA: Diagnosis present

## 2020-05-20 DIAGNOSIS — R0989 Other specified symptoms and signs involving the circulatory and respiratory systems: Secondary | ICD-10-CM | POA: Diagnosis present

## 2020-05-20 DIAGNOSIS — E1121 Type 2 diabetes mellitus with diabetic nephropathy: Secondary | ICD-10-CM | POA: Diagnosis not present

## 2020-05-20 DIAGNOSIS — Z7902 Long term (current) use of antithrombotics/antiplatelets: Secondary | ICD-10-CM | POA: Diagnosis not present

## 2020-05-20 DIAGNOSIS — E669 Obesity, unspecified: Secondary | ICD-10-CM | POA: Diagnosis not present

## 2020-05-20 DIAGNOSIS — I5033 Acute on chronic diastolic (congestive) heart failure: Secondary | ICD-10-CM | POA: Diagnosis present

## 2020-05-20 DIAGNOSIS — E66813 Obesity, class 3: Secondary | ICD-10-CM | POA: Diagnosis present

## 2020-05-20 DIAGNOSIS — Z794 Long term (current) use of insulin: Secondary | ICD-10-CM | POA: Diagnosis not present

## 2020-05-20 DIAGNOSIS — Z8042 Family history of malignant neoplasm of prostate: Secondary | ICD-10-CM

## 2020-05-20 DIAGNOSIS — Z7982 Long term (current) use of aspirin: Secondary | ICD-10-CM

## 2020-05-20 DIAGNOSIS — IMO0002 Reserved for concepts with insufficient information to code with codable children: Secondary | ICD-10-CM

## 2020-05-20 DIAGNOSIS — R001 Bradycardia, unspecified: Secondary | ICD-10-CM | POA: Diagnosis present

## 2020-05-20 LAB — PROCALCITONIN: Procalcitonin: 0.14 ng/mL

## 2020-05-20 LAB — BASIC METABOLIC PANEL
Anion gap: 12 (ref 5–15)
BUN: 58 mg/dL — ABNORMAL HIGH (ref 6–20)
CO2: 19 mmol/L — ABNORMAL LOW (ref 22–32)
Calcium: 8.9 mg/dL (ref 8.9–10.3)
Chloride: 106 mmol/L (ref 98–111)
Creatinine, Ser: 3.28 mg/dL — ABNORMAL HIGH (ref 0.44–1.00)
GFR calc Af Amer: 17 mL/min — ABNORMAL LOW (ref >60)
GFR calc non Af Amer: 15 mL/min — ABNORMAL LOW (ref >60)
Glucose, Bld: 188 mg/dL — ABNORMAL HIGH (ref 70–99)
Potassium: 3.8 mmol/L (ref 3.5–5.1)
Sodium: 137 mmol/L (ref 135–145)

## 2020-05-20 LAB — CBC
HCT: 25.4 % — ABNORMAL LOW (ref 36.0–46.0)
Hemoglobin: 8.5 g/dL — ABNORMAL LOW (ref 12.0–15.0)
MCH: 29.3 pg (ref 26.0–34.0)
MCHC: 33.5 g/dL (ref 30.0–36.0)
MCV: 87.6 fL (ref 80.0–100.0)
Platelets: 253 10*3/uL (ref 150–400)
RBC: 2.9 MIL/uL — ABNORMAL LOW (ref 3.87–5.11)
RDW: 15.3 % (ref 11.5–15.5)
WBC: 15 10*3/uL — ABNORMAL HIGH (ref 4.0–10.5)
nRBC: 0 % (ref 0.0–0.2)

## 2020-05-20 LAB — BRAIN NATRIURETIC PEPTIDE: B Natriuretic Peptide: 726.3 pg/mL — ABNORMAL HIGH (ref 0.0–100.0)

## 2020-05-20 LAB — TROPONIN I (HIGH SENSITIVITY)
Troponin I (High Sensitivity): 687 ng/L (ref <18)
Troponin I (High Sensitivity): 700 ng/L (ref <18)

## 2020-05-20 LAB — GLUCOSE, CAPILLARY
Glucose-Capillary: 219 mg/dL — ABNORMAL HIGH (ref 70–99)
Glucose-Capillary: 337 mg/dL — ABNORMAL HIGH (ref 70–99)

## 2020-05-20 LAB — SARS CORONAVIRUS 2 BY RT PCR (HOSPITAL ORDER, PERFORMED IN ~~LOC~~ HOSPITAL LAB): SARS Coronavirus 2: NEGATIVE

## 2020-05-20 MED ORDER — VANCOMYCIN HCL IN DEXTROSE 1-5 GM/200ML-% IV SOLN
1000.0000 mg | Freq: Once | INTRAVENOUS | Status: AC
  Administered 2020-05-20: 1000 mg via INTRAVENOUS
  Filled 2020-05-20: qty 200

## 2020-05-20 MED ORDER — METOPROLOL TARTRATE 50 MG PO TABS
50.0000 mg | ORAL_TABLET | Freq: Two times a day (BID) | ORAL | Status: DC
  Administered 2020-05-20 – 2020-05-24 (×7): 50 mg via ORAL
  Filled 2020-05-20 (×8): qty 1

## 2020-05-20 MED ORDER — SODIUM CHLORIDE 0.9% FLUSH: 3.0000 mL | INTRAVENOUS | Status: DC | PRN

## 2020-05-20 MED ORDER — INSULIN GLARGINE 100 UNIT/ML ~~LOC~~ SOLN
40.0000 [IU] | Freq: Every day | SUBCUTANEOUS | Status: DC
  Administered 2020-05-20 – 2020-05-21 (×2): 40 [IU] via SUBCUTANEOUS
  Filled 2020-05-20 (×3): qty 0.4

## 2020-05-20 MED ORDER — SODIUM CHLORIDE 0.9% FLUSH
3.0000 mL | Freq: Two times a day (BID) | INTRAVENOUS | Status: DC
  Administered 2020-05-20 – 2020-05-24 (×8): 3 mL via INTRAVENOUS

## 2020-05-20 MED ORDER — SODIUM CHLORIDE 0.9 % IV SOLN
2.0000 g | Freq: Once | INTRAVENOUS | Status: AC
  Administered 2020-05-20: 2 g via INTRAVENOUS
  Filled 2020-05-20: qty 2

## 2020-05-20 MED ORDER — FENOFIBRATE 160 MG PO TABS: 160.0000 mg | ORAL_TABLET | Freq: Every day | ORAL | Status: DC

## 2020-05-20 MED ORDER — HYDRALAZINE HCL 50 MG PO TABS
75.0000 mg | ORAL_TABLET | Freq: Three times a day (TID) | ORAL | Status: DC
  Administered 2020-05-20 – 2020-05-21 (×4): 75 mg via ORAL
  Filled 2020-05-20: qty 1
  Filled 2020-05-20: qty 2
  Filled 2020-05-20 (×2): qty 1

## 2020-05-20 MED ORDER — FUROSEMIDE 10 MG/ML IJ SOLN
60.0000 mg | Freq: Two times a day (BID) | INTRAMUSCULAR | Status: DC
  Administered 2020-05-20 – 2020-05-22 (×4): 60 mg via INTRAVENOUS
  Filled 2020-05-20: qty 8
  Filled 2020-05-20 (×3): qty 6

## 2020-05-20 MED ORDER — INSULIN GLARGINE 100 UNIT/ML SOLOSTAR PEN: 80.0000 [IU] | PEN_INJECTOR | Freq: Every day | SUBCUTANEOUS | Status: DC

## 2020-05-20 MED ORDER — AMLODIPINE BESYLATE 10 MG PO TABS
10.0000 mg | ORAL_TABLET | Freq: Every day | ORAL | Status: DC
  Administered 2020-05-20 – 2020-05-23 (×4): 10 mg via ORAL
  Filled 2020-05-20 (×4): qty 1

## 2020-05-20 MED ORDER — VANCOMYCIN HCL 1500 MG/300ML IV SOLN
1500.0000 mg | Freq: Once | INTRAVENOUS | Status: AC
  Administered 2020-05-20: 1500 mg via INTRAVENOUS
  Filled 2020-05-20: qty 300

## 2020-05-20 MED ORDER — FUROSEMIDE 10 MG/ML IJ SOLN: 40.0000 mg | Freq: Once | INTRAMUSCULAR | Status: DC

## 2020-05-20 MED ORDER — ONDANSETRON HCL 4 MG/2ML IJ SOLN: 4.0000 mg | Freq: Four times a day (QID) | INTRAMUSCULAR | Status: DC | PRN

## 2020-05-20 MED ORDER — ACETAMINOPHEN 325 MG PO TABS: 650.0000 mg | ORAL_TABLET | Freq: Three times a day (TID) | ORAL | Status: DC | PRN

## 2020-05-20 MED ORDER — ALLOPURINOL 300 MG PO TABS
300.0000 mg | ORAL_TABLET | Freq: Every day | ORAL | Status: DC
  Administered 2020-05-21 – 2020-05-23 (×3): 300 mg via ORAL
  Filled 2020-05-20 (×3): qty 1

## 2020-05-20 MED ORDER — FUROSEMIDE 10 MG/ML IJ SOLN
20.0000 mg | Freq: Once | INTRAMUSCULAR | Status: AC
  Administered 2020-05-20: 20 mg via INTRAVENOUS
  Filled 2020-05-20: qty 4

## 2020-05-20 MED ORDER — SODIUM CHLORIDE 0.9 % IV SOLN: 250.0000 mL | INTRAVENOUS | Status: DC | PRN

## 2020-05-20 MED ORDER — CLOPIDOGREL BISULFATE 75 MG PO TABS
75.0000 mg | ORAL_TABLET | Freq: Every day | ORAL | Status: DC
  Administered 2020-05-20 – 2020-05-24 (×5): 75 mg via ORAL
  Filled 2020-05-20 (×5): qty 1

## 2020-05-20 MED ORDER — ISOSORBIDE MONONITRATE ER 30 MG PO TB24
30.0000 mg | ORAL_TABLET | Freq: Every day | ORAL | Status: DC
  Administered 2020-05-21 – 2020-05-24 (×3): 30 mg via ORAL
  Filled 2020-05-20 (×3): qty 1

## 2020-05-20 MED ORDER — ROSUVASTATIN CALCIUM 20 MG PO TABS: 20.0000 mg | ORAL_TABLET | Freq: Every day | ORAL | Status: DC

## 2020-05-20 MED ORDER — HEPARIN SODIUM (PORCINE) 5000 UNIT/ML IJ SOLN
5000.0000 [IU] | Freq: Three times a day (TID) | INTRAMUSCULAR | Status: DC
  Administered 2020-05-20 – 2020-05-24 (×11): 5000 [IU] via SUBCUTANEOUS
  Filled 2020-05-20 (×11): qty 1

## 2020-05-20 MED ORDER — INSULIN ASPART 100 UNIT/ML ~~LOC~~ SOLN
0.0000 [IU] | Freq: Three times a day (TID) | SUBCUTANEOUS | Status: DC
  Administered 2020-05-20 – 2020-05-22 (×6): 5 [IU] via SUBCUTANEOUS
  Administered 2020-05-22: 11 [IU] via SUBCUTANEOUS
  Administered 2020-05-23: 5 [IU] via SUBCUTANEOUS
  Administered 2020-05-23 – 2020-05-24 (×2): 8 [IU] via SUBCUTANEOUS
  Administered 2020-05-24: 3 [IU] via SUBCUTANEOUS
  Filled 2020-05-20 (×11): qty 1

## 2020-05-20 MED ORDER — ASPIRIN EC 81 MG PO TBEC
81.0000 mg | DELAYED_RELEASE_TABLET | ORAL | Status: DC
  Administered 2020-05-21 – 2020-05-24 (×4): 81 mg via ORAL
  Filled 2020-05-20 (×5): qty 1

## 2020-05-20 NOTE — ED Notes (Signed)
Pt Sat 90-91% on RA. Pt with labored breathing. Pt placed on 2L [place]

## 2020-05-20 NOTE — ED Triage Notes (Signed)
First nurse note- here for [facility]. Hx CHF. sats 92% RA, placed on 2 L by EMS.

## 2020-05-20 NOTE — ED Notes (Signed)
[place] was sent to lab. 

## 2020-05-20 NOTE — ED Triage Notes (Signed)
Pt comes into the ED via EMS from home with c/o "I cant breath": states it started last night, denies any cough or pain. Pt Is speaking in complete sentences with no distress noted.

## 2020-05-20 NOTE — Consult Note (Signed)
PHARMACY -  BRIEF ANTIBIOTIC NOTE   Pharmacy has received consult(s) for PNA from an ED provider.  The patient's profile has been reviewed for ht/wt/allergies/indication/available labs.    One time order(s) placed for cefepime and vancomycin   Further antibiotics/pharmacy consults should be ordered by admitting physician if indicated.                       Thank you, Oswald Hillock, PharmD, BCPS 05/20/2020  12:28 PM

## 2020-05-20 NOTE — ED Provider Notes (Addendum)
[facility] Emergency Department Provider Note  ____________________________________________   First MD Initiated Contact with Patient 05/20/20 1206     (approximate)  I have reviewed the triage vital signs and the nursing notes.  History  Chief Complaint Shortness of Breath    HPI Sharon Arias is a 59 y.o. female with hx of CKD, diastolic HF, CAD, anemia of chronic disease, DM, HTN, obesity, who presents to the ER for SOB. History also notable for recent admission (5/24 to 5/27) for Enterococcus UTI + AKI + non-traumatic rhabdo.   Patient states she first became short of breath yesterday, describes a feeling like she cannot catch her breath.  Symptoms have been constant since onset, without relief.  Also has associated orthopnea, which is new.  No notable alleviating components.  She reports some mild swelling in her BLE, but states this is baseline for her.  She reports compliance with all of her medications, including her discharge amoxicillin.  She is prescribed Lasix as needed, states she last took a dose 2 days ago.  Denies any fever, cough, sick contacts.  Has been fully vaccinated against COVID.  She denies any discrete chest pain, but says she has intermittent "twinges"of chest discomfort ever since being started on hydralazine for her blood pressure.  In the ER she is noted to be oxygenating 90 to 91% on RA with slightly labored breathing, therefore placed on 2 L [place].  Does not normally wear oxygen at baseline.   Past Medical Hx Past Medical History:  Diagnosis Date  . Anemia in chronic kidney disease 05/19/2016  . CAD (coronary artery disease)   . CHF (congestive heart failure) ([place])   . Chicken pox   . Chronic kidney disease    per dr sparks  . CVA (cerebral vascular accident) ([place])   . Detached retina   . Diabetes mellitus without complication ([place])    type 2  . Diabetic neuropathy ([place])   . Diabetic retinopathy ([place])    legally blind  .  Hyperlipidemia   . Hypertension   . Myocardial infarction ([place])   . Obesity   . PONV (postoperative nausea and vomiting)   . Sciatica of right side    going to see physiciatry    Problem List Patient Active Problem List   Diagnosis Date Noted  . Acute renal failure superimposed on stage 4 chronic kidney disease ([place]) 05/14/2020  . Enterococcus faecalis infection 05/14/2020  . Non-traumatic rhabdomyolysis 05/14/2020  . Chronic diastolic CHF (congestive heart failure) ([place]) 05/14/2020  . Bradycardia 05/14/2020  . Diabetes mellitus type 2, uncontrolled, with complications ([place]) 22/01/5426  . Morbid obesity with BMI of 45.0-49.9, adult ([place]) 05/14/2020  . Rhabdomyolysis 05/14/2020  . UTI (urinary tract infection) 05/12/2020  . AKI (acute kidney injury) ([place]) 03/21/2020  . Right pontine stroke ([place]) 03/18/2020  . Gout 03/18/2020  . Leukocytosis 03/18/2020  . Left sided numbness 03/18/2020  . Acute CHF (congestive heart failure) ([place]) 03/18/2020  . Acute CVA (cerebrovascular accident) ([place]) 07/21/2019  . Necrotizing soft tissue infection 12/11/2018  . CAP (community acquired pneumonia) 03/18/2018  . Hypocalcemia 03/18/2018  . Hypokalemia 03/18/2018  . CKD (chronic kidney disease), stage IV ([place]) 09/29/2016  . Chronic arthritis 09/29/2016  . Anemia in chronic kidney disease 05/19/2016  . DDD (degenerative disc disease), lumbar 01/13/2016  . Type 2 diabetes mellitus with renal complication ([place]) 06/12/7627  . Proliferative diabetic retinopathy with macular edema associated with type 2 diabetes mellitus ([place]) 11/13/2014  .  Coronary artery disease 08/17/2012  . Obesity, Class III, BMI 40-49.9 (morbid obesity) ([place]) 08/16/2012  . Myocardial infarction ([place]) 08/16/2012  . Hyperlipidemia, unspecified 08/16/2012  . HTN (hypertension) 08/16/2012    Past Surgical Hx Past Surgical History:  Procedure Laterality Date  . COLONOSCOPY WITH PROPOFOL N/A 01/26/2016   Procedure: COLONOSCOPY  WITH PROPOFOL;  Surgeon: Lollie Sails, MD;  Location: [facility] ENDOSCOPY;  Service: Endoscopy;  Laterality: N/A;  . COLONOSCOPY WITH PROPOFOL N/A 01/27/2016   Procedure: COLONOSCOPY WITH PROPOFOL;  Surgeon: Lollie Sails, MD;  Location: [facility] ENDOSCOPY;  Service: Endoscopy;  Laterality: N/A;  . CORONARY ARTERY BYPASS GRAFT  2013  . INCISION AND DRAINAGE     chest abscess  . INCISION AND DRAINAGE ABSCESS N/A 12/11/2018   Procedure: INCISION AND DRAINAGE PERINEAL;  Surgeon: Jules Husbands, MD;  Location: [facility] ORS;  Service: General;  Laterality: N/A;  . RETINAL LASER PROCEDURE      Medications Prior to Admission medications   Medication Sig Start Date End Date Taking? Authorizing Provider  acetaminophen (TYLENOL) 500 MG tablet Take 1,000 mg by mouth every 8 (eight) hours as needed for pain. 12/18/18   [provider]  allopurinol (ZYLOPRIM) 300 MG tablet Take 300 mg by mouth daily. 05/05/20   [provider]  amLODipine (NORVASC) 10 MG tablet Take 10 mg by mouth at bedtime. 12/04/18   [provider]  amoxicillin (AMOXIL) 500 MG capsule Take 1 capsule (500 mg total) by mouth every 8 (eight) hours. 05/15/20   Allie Bossier, MD  aspirin EC 81 MG tablet Take 81 mg by mouth every morning.    [provider]  clopidogrel (PLAVIX) 75 MG tablet Take 1 tablet (75 mg total) by mouth daily. 07/23/19   Vaughan Basta, MD  hydrALAZINE (APRESOLINE) 25 MG tablet Take 3 tablets (75 mg total) by mouth 3 (three) times daily. 05/15/20   Allie Bossier, MD  Insulin Glargine (LANTUS SOLOSTAR) 100 UNIT/ML Solostar Pen Inject 80 Units into the skin at bedtime. 10/07/18   [provider]  insulin lispro (HUMALOG) 100 UNIT/ML KwikPen Inject 15-30 Units into the skin See admin instructions. Pt. Uses 15 un/ml in the mornings, 25un/ml in the afternoon, and 30 un/ml at bedtime 10/02/18   [provider]  isosorbide mononitrate (IMDUR) 30 MG 24 hr tablet Take 1  tablet (30 mg total) by mouth daily. 05/16/20   Allie Bossier, MD  methocarbamol (ROBAXIN) 500 MG tablet Take 1 tablet (500 mg total) by mouth every 8 (eight) hours as needed for muscle spasms. 05/15/20   Allie Bossier, MD  metoprolol tartrate (LOPRESSOR) 50 MG tablet Take 1 tablet (50 mg total) by mouth 2 (two) times daily. 05/15/20   Allie Bossier, MD  rosuvastatin (CRESTOR) 20 MG tablet Take 20 mg by mouth at bedtime.  03/13/20   [provider]    Allergies Ozempic (0.25 or 0.5 mg-dose) [semaglutide(0.25 or 0.5mg -dos)] and Trulicity [dulaglutide]  Family Hx Family History  Problem Relation Age of Onset  . Breast cancer Mother   . Prostate cancer Father        we think mets to liver and bone    Social Hx Social History   Tobacco Use  . Smoking status: Never Smoker  . Smokeless tobacco: Never Used  Substance Use Topics  . Alcohol use: No  . Drug use: No     Review of Systems  Constitutional: Negative for fever. Negative for chills. Eyes: Negative for  visual changes. ENT: Negative for sore throat. Cardiovascular: Negative for chest pain. Respiratory: Positive for shortness of breath. Gastrointestinal: Negative for nausea. Negative for vomiting.  Genitourinary: Negative for dysuria. Musculoskeletal: Negative for leg swelling. Skin: Negative for rash. Neurological: Negative for headaches.   Physical Exam  Vital Signs: ED Triage Vitals  Enc Vitals Group     BP 05/20/20 1024 (!) 160/50     Pulse Rate 05/20/20 1024 80     Resp 05/20/20 1024 17     Temp 05/20/20 1024 99.7 F (37.6 C)     Temp Source 05/20/20 1024 Oral     SpO2 05/20/20 1024 94 %     Weight 05/20/20 1025 250 lb (113.4 kg)     Height 05/20/20 1025 5\' 7"  (1.702 m)     Head Circumference --      [place] Flow --      Pain Score 05/20/20 1024 0     Pain Loc --      Pain Edu? --      Excl. in [place]? --     Constitutional: Alert and oriented.  On 2 L [place], does not normally wear supplemental  oxygen.  NAD.  Head: Normocephalic. Atraumatic. Eyes: Conjunctivae clear. Sclera anicteric. Pupils equal and symmetric. Nose: No masses or lesions. No congestion or rhinorrhea. Mouth/Throat: Wearing mask.  Neck: No stridor. Trachea midline.  Cardiovascular: Normal rate, regular rhythm. Extremities well perfused. Respiratory: Normal respiratory effort.  Lungs CTA anteriorly, somewhat limited by body habitus.  On 2 LNC. Gastrointestinal: Soft. Non-distended. Non-tender.  Genitourinary: Deferred. Musculoskeletal: Trace, pretibial lower extremity edema, symmetric. No deformities. Neurologic:  Normal speech and language. No gross focal or lateralizing neurologic deficits are appreciated.  Skin: Skin is warm, dry and intact. No rash noted. Psychiatric: Mood and affect are appropriate for situation.  EKG  Personally reviewed and interpreted by myself.   Date: 05/20/20 Time: 1026 Rate: 78 Rhythm: sinus Axis: left Intervals: WNl Non specific ST/T wave findings Appears grossly unchanged compared to 4/5 No STEMI    Radiology  Personally reviewed available imaging myself.   CXR - IMPRESSION:  1. Focal opacity within the peripheral aspect of the left lung base,  suspicious for pneumonia.  2. Mild pulmonary vascular congestion and bilateral perihilar  interstitial prominence suggesting mild edema.    Procedures  Procedure(s) performed (including critical care):  Procedures   Initial Impression / Assessment and Plan / MDM / ED Course  59 y.o. female who presents to the ED for SOB, orthopnea since yesterday.  Now with a new 2 L [place] oxygen requirement.  Ddx: diastolic heart failure exacerbation, pulmonary edema, pneumonia or other pulmonary infection  Work-up appears consistent with combined etiology of patient's symptoms - dHF + possible PNA. High-sensitivity troponin elevated at 687, though this is improved from her priors, suspect secondary to demand, will trend.  EKG appears  grossly unchanged from prior, does not meet STEMI criteria.  Creatinine 3.28, similar to her priors from recent hospitalization.  Does have a leukocytosis to 15.  Hemoglobin 8.5, consistent with baseline.  In the setting of her recent hospitalization and course of amoxicillin, will plan for broad pneumonia healthcare associated coverage including vancomycin and cefepime, discussed with pharmacy.  Will obtain cultures.  Added on procalcitonin as well. Discussed w/ hospitalist for admission, added on CT chest non-con per their request to help differentiate lung findings and f/u of CXR findings.   With regards to likely dHF component, added on BNP. Ordered small dose  of Lasix, for treatment of dHF.  Will plan to admit, patient in agreement with the plan of care, including admission.  _______________________________   As part of my medical decision making I have reviewed available labs, radiology tests, reviewed old records/performed chart review, obtained additional history from family, and discussed with consultants (pharmacy).     Final Clinical Impression(s) / ED Diagnosis  SOB Pneumonia Diastolic heart failure exacerbation Elevated BNP   Note:  This document was prepared using Dragon voice recognition software and may include unintentional dictation errors.     Lilia Pro., MD 05/20/20 [phone]

## 2020-05-20 NOTE — ED Notes (Addendum)
Pt self checked CBG and meter states 219. Pt's CBG checked with ED Glucometer and results did not cross. Glucometer reading was 219.

## 2020-05-20 NOTE — Consult Note (Signed)
[facility] Cardiology  CARDIOLOGY CONSULT NOTE  Patient ID: Sharon Arias MRN: [id number] DOB/AGE: [birth year] 59 y.o.  Admit date: 05/20/2020 Referring Physician [place] Primary Physician [place] Primary Cardiologist Barnell Shieh Reason for Consultation congestive heart failure  HPI: 59 year old female referred for evaluation of congestive heart failure and elevated troponin.  The patient was in her usual state of health until yesterday when she started experiencing worsening shortness of breath and presented to [facility] emergency room today.  Emergency room, the patient was to have an oxygen saturation of 92% on room air and was placed on 2 L of oxygen by nasal cannula.  Chest x-ray revealed possible left lower lobe pneumonia with mild pulmonary vascular congestion.  CT scan showed bibasilar atelectasis and effusions.  Lab work was notable for elevated white count of 15,000.  BUN and creatinine were 58 and 3.28 with a GFR of 15.  BNP was 726.3.  High-sensitivity troponin was 687 and 700.  Patient denies angina.  ECG revealed sinus rhythm with left ventricular hypertrophy.  Patient has known coronary disease, status post CABG 2013, with cardiac catheterization 01/20/2017 revealing patent LIMA to LAD, occluded SVG to OM1/OM 2, and underwent DES of mid RCA.  The patient was recently hospitalized 03/18/2020 with shortness of breath at which time high-sensitivity troponin peaked at 2884.  2D echocardiogram revealed normal left ventricular function, with LVEF of 55 to 60%.  Cardiac catheterization was deferred in light of high risk for contrast-induced nephrotoxicity with resultant renal failure.  Review of systems complete and found to be negative unless listed above     Past Medical History:  Diagnosis Date  . Anemia in chronic kidney disease 05/19/2016  . CAD (coronary artery disease)   . CHF (congestive heart failure) ([place])   . Chicken pox   . Chronic kidney disease    per dr sparks  . CVA (cerebral vascular  accident) ([place])   . Detached retina   . Diabetes mellitus without complication ([place])    type 2  . Diabetic neuropathy ([place])   . Diabetic retinopathy ([place])    legally blind  . Hyperlipidemia   . Hypertension   . Myocardial infarction ([place])   . Obesity   . PONV (postoperative nausea and vomiting)   . Sciatica of right side    going to see physiciatry    Past Surgical History:  Procedure Laterality Date  . COLONOSCOPY WITH PROPOFOL N/A 01/26/2016   Procedure: COLONOSCOPY WITH PROPOFOL;  Surgeon: Lollie Sails, MD;  Location: [facility] ENDOSCOPY;  Service: Endoscopy;  Laterality: N/A;  . COLONOSCOPY WITH PROPOFOL N/A 01/27/2016   Procedure: COLONOSCOPY WITH PROPOFOL;  Surgeon: Lollie Sails, MD;  Location: [facility] ENDOSCOPY;  Service: Endoscopy;  Laterality: N/A;  . CORONARY ARTERY BYPASS GRAFT  2013  . INCISION AND DRAINAGE     chest abscess  . INCISION AND DRAINAGE ABSCESS N/A 12/11/2018   Procedure: INCISION AND DRAINAGE PERINEAL;  Surgeon: Jules Husbands, MD;  Location: [facility] ORS;  Service: General;  Laterality: N/A;  . RETINAL LASER PROCEDURE      (Not in a hospital admission)  Social History   Socioeconomic History  . Marital status: Married    Spouse name: Not on file  . Number of children: Not on file  . Years of education: Not on file  . Highest education level: Not on file  Occupational History  . Not on file  Tobacco Use  . Smoking status: Never Smoker  . Smokeless tobacco: Never Used  Substance  and Sexual Activity  . Alcohol use: No  . Drug use: No  . Sexual activity: Not Currently  Other Topics Concern  . Not on file  Social History Narrative  . Not on file   Social Determinants of Health   Financial Resource Strain:   . Difficulty of Paying Living Expenses:   Food Insecurity:   . Worried About Charity fundraiser in the Last Year:   . Arboriculturist in the Last Year:   Transportation Needs:   . Film/video editor (Medical):   [name] Lack of  Transportation (Non-Medical):   Physical Activity:   . Days of Exercise per Week:   . Minutes of Exercise per Session:   Stress:   . Feeling of Stress :   Social Connections:   . Frequency of Communication with Friends and Family:   . Frequency of Social Gatherings with Friends and Family:   . Attends Religious Services:   . Active Member of Clubs or Organizations:   . Attends Archivist Meetings:   [name] Marital Status:   Intimate Partner Violence:   . Fear of Current or Ex-Partner:   . Emotionally Abused:   [name] Physically Abused:   . Sexually Abused:     Family History  Problem Relation Age of Onset  . Breast cancer Mother   . Prostate cancer Father        we think mets to liver and bone      Review of systems complete and found to be negative unless listed above      PHYSICAL EXAM  General: Well developed, well nourished, in no acute distress HEENT:  Normocephalic and atramatic Neck:  No JVD.  Lungs: Clear bilaterally to auscultation and percussion. Heart: HRRR . Normal S1 and S2 without gallops or murmurs.  Abdomen: Bowel sounds are positive, abdomen soft and non-tender  Msk:  Back normal, normal gait. Normal strength and tone for age. Extremities: No clubbing, cyanosis or edema.   Neuro: Alert and oriented X 3. Psych:  Good affect, responds appropriately  Labs:   Lab Results  Component Value Date   WBC 15.0 (H) 05/20/2020   HGB 8.5 (L) 05/20/2020   HCT 25.4 (L) 05/20/2020   MCV 87.6 05/20/2020   PLT 253 05/20/2020    Recent Labs  Lab 05/15/20 0512 05/15/20 0512 05/20/20 1027  NA 140   < > 137  K 4.6   < > 3.8  CL 112*   < > 106  CO2 17*   < > 19*  BUN 74*   < > 58*  CREATININE 3.49*   < > 3.28*  CALCIUM 9.0   < > 8.9  PROT 7.1  --   --   BILITOT 0.5  --   --   ALKPHOS 54  --   --   ALT 56*  --   --   AST 34  --   --   GLUCOSE 160*   < > 188*   < > = values in this interval not displayed.   Lab Results  Component Value Date    CKTOTAL 1,976 (H) 05/15/2020    Lab Results  Component Value Date   CHOL 116 03/19/2020   CHOL 252 (H) 07/22/2019   Lab Results  Component Value Date   HDL 40 (L) 03/19/2020   HDL 30 (L) 07/22/2019   Lab Results  Component Value Date   LDLCALC 46 03/19/2020   [place] UNABLE TO  CALCULATE IF TRIGLYCERIDE OVER 400 mg/dL 07/22/2019   Lab Results  Component Value Date   TRIG 149 03/19/2020   TRIG 402 (H) 07/22/2019   Lab Results  Component Value Date   CHOLHDL 2.9 03/19/2020   CHOLHDL 8.4 07/22/2019   Lab Results  Component Value Date   LDLDIRECT 141.7 (H) 07/22/2019      Radiology: DG Chest 2 View  Result Date: 05/20/2020 CLINICAL DATA:  Shortness of breath EXAM: CHEST - 2 VIEW COMPARISON:  03/18/2020 FINDINGS: Post CABG. Stable cardiomediastinal contours. Atherosclerotic calcification of the aortic knob. Mild pulmonary vascular congestion and bilateral perihilar interstitial prominence. Focal opacity within the peripheral aspect of the left lung base. Trace left pleural effusion. IMPRESSION: 1. Focal opacity within the peripheral aspect of the left lung base, suspicious for pneumonia. 2. Mild pulmonary vascular congestion and bilateral perihilar interstitial prominence suggesting mild edema. Electronically Signed   By: Davina Poke D.O.   On: 05/20/2020 10:48   CT Chest Wo Contrast  Result Date: 05/20/2020 CLINICAL DATA:  Anemia.  Pneumonia.  Abnormal chest radiograph EXAM: CT CHEST WITHOUT CONTRAST TECHNIQUE: Multidetector CT imaging of the chest was performed following the standard protocol without IV contrast. COMPARISON:  Radiograph 05/20/2020 FINDINGS: Cardiovascular: Post CABG Mediastinum/Nodes: No axillary or supraclavicular adenopathy. No mediastinal or hilar adenopathy. No pericardial effusion. Esophagus normal. Lungs/Pleura: There is bibasilar atelectasis with mild consolidation and air bronchograms. Mild ground-glass opacities in lower lobes. Small bilateral pleural  effusions. These findings are symmetric LEFT to RIGHT. No nodularity. Upper Abdomen: Limited view of the liver, kidneys, pancreas are unremarkable. Normal adrenal glands. Musculoskeletal: No aggressive osseous lesion. IMPRESSION: 1. Bibasilar atelectasis and effusions. Bibasilar infiltrates are less favored. 2. No adenopathy. Electronically Signed   By: Suzy Bouchard M.D.   On: 05/20/2020 14:07   CT Renal Stone Study  Result Date: 05/12/2020 CLINICAL DATA:  Lower back and lower abdominal pain, urinary urgency and frequency, intermittent nausea EXAM: CT ABDOMEN AND PELVIS WITHOUT CONTRAST TECHNIQUE: Multidetector CT imaging of the abdomen and pelvis was performed following the standard protocol without IV contrast. COMPARISON:  12/11/2018 FINDINGS: Lower chest: No acute pleural or parenchymal lung disease. Hepatobiliary: Numerous calcified gallstones are again identified, with no evidence of acute cholecystitis. Enhanced imaging of the liver is unremarkable. Pancreas: Unremarkable. No pancreatic ductal dilatation or surrounding inflammatory changes. Spleen: Normal in size without focal abnormality. Adrenals/Urinary Tract: No urinary tract calculi or obstructive uropathy within either kidney. Bilateral renal cortical atrophy unchanged. The adrenals are unremarkable. Bladder is minimally distended with no focal abnormality. Stomach/Bowel: No bowel obstruction or ileus. Normal appendix right lower quadrant. No bowel wall thickening or inflammatory change. Vascular/Lymphatic: There is severe atherosclerosis of the aorta and its distal branches. No pathologic adenopathy. Reproductive: Uterus and bilateral adnexa are unremarkable. Other: No abdominal wall hernia or abnormality. No abdominopelvic ascites. Musculoskeletal: No acute or destructive bony lesions. Reconstructed images demonstrate no additional findings. IMPRESSION: 1. No urinary tract calculi or obstructive uropathy. 2. Cholelithiasis without evidence of  acute cholecystitis. 3. Aortic Atherosclerosis (ICD10-I70.0). Electronically Signed   By: Randa Ngo M.D.   On: 05/12/2020 18:36   US Abdomen Limited RUQ  Result Date: 05/12/2020 CLINICAL DATA:  Right-sided pain EXAM: ULTRASOUND ABDOMEN LIMITED RIGHT UPPER QUADRANT COMPARISON:  CT 05/12/2020 FINDINGS: Gallbladder: Sludge and small stones within the gallbladder. Normal wall thickness. Negative sonographic Murphy. Nodular echogenic foci along the anterior wall of gallbladder, possible cholesterolosis. Common bile duct: Diameter: 1.4 mm Liver: No focal lesion identified. Within normal  limits in parenchymal echogenicity. Portal vein is patent on color Doppler imaging with normal direction of blood flow towards the liver. Other: None. IMPRESSION: Sludge and stones within the gallbladder but without other sonographic features to suggest acute cholecystitis. No biliary dilatation. Electronically Signed   By: Donavan Foil M.D.   On: 05/12/2020 21:10    EKG: Sinus rhythm with left ventricular hypertrophy  ASSESSMENT AND PLAN:   1.  Shortness of breath, likely multifactorial, secondary to left lower lobe pneumonia and acute on chronic diastolic congestive heart failure 2.  Acute on chronic diastolic congestive heart failure 3.  Elevated high-sensitivity troponin, likely demand supply ischemia secondary to above 4.  CAD, status post CABG 2013, status post DES mid RCA, currently without angina 5.  CKD, stage IV, very high risk for contrast-induced nephrotoxicity 6.  History of CVA  Recommendations  1.  Agree with current therapy 2.  Continue diuresis 3.  Carefully monitor renal status 4.  Defer full dose anticoagulation 5.  Continue clopidogrel 6.  Defer cardiac catheterization in light of patient's renal status and high risk for contrast-induced nephrotoxicity  Signed: Isaias Cowman MD,PhD, [facility] 05/20/2020, 4:07 PM

## 2020-05-20 NOTE — Progress Notes (Signed)
Pt arrived via stretcher to room 249.  VSS, weight completed, placed on cardiac monitor, and oriented to safety features of the room.

## 2020-05-20 NOTE — H&P (Addendum)
History and Physical    Sharon Arias HQI:[id number] DOB: 1961/08/02 DOA: 05/20/2020  PCP: Idelle Crouch, MD   Patient coming from: Home  I have personally briefly reviewed patient's old medical records in [place]  Chief Complaint: Shortness of breath  HPI: Sharon Arias is a 59 y.o. female with medical history significant for stage IV chronic kidney disease, chronic diastolic dysfunction CHF, coronary artery disease, morbid obesity, diabetes mellitus and hypertension who presents to the emergency room for evaluation of shortness of breath.  Patient was recently discharged from the hospital about a week ago.  Patient states that her symptoms started acutely 1 day prior to her admission and she feels like she is unable to catch her breath.  Symptoms have been constant without relief and is associated with orthopnea and lower extremity swelling.  Patient states that she has Lasix prescribed which she takes as needed for swelling and her last dose was 2 days ago. Patient denies having any fever, chills, cough, headache, nausea, vomiting and changes in her bowel habits.  She denies having any chest pain, diaphoresis or palpitations. Patient's pulse ox on room air in the field was 92% and she was placed on 2 L of oxygen by EMS. Chest x-ray shows focal opacity within the peripheral aspect of the left lung base,suspicious for pneumonia. Mild pulmonary vascular congestion and bilateral perihilar interstitial prominence suggesting mild edema. CT scan of the chest showed bibasilar atelectasis and effusions. Bibasilar infiltrates are less favored. No adenopathy. Twelve-lead EKG shows sinus rhythm with LVH Labs reveal elevated troponin levels down from her last hospitalization.  She has a white count of 15,000 and hemoglobin of 8.5 consistent with her baseline.   ED Course: Patient seen in the emergency room for evaluation of shortness of breath, orthopnea and lower extremity swelling.  She  was placed on 2 L of oxygen in the field with pulse oximetry of 92% on room air.  Chest x-ray was suggestive of CHF and possible pneumonia patient received vancomycin and cefepime in the emergency room.  She will be admitted to the hospital for further evaluation.  Review of Systems: As per HPI otherwise 10 point review of systems negative.    Past Medical History:  Diagnosis Date  . Anemia in chronic kidney disease 05/19/2016  . CAD (coronary artery disease)   . CHF (congestive heart failure) ([place])   . Chicken pox   . Chronic kidney disease    per dr sparks  . CVA (cerebral vascular accident) ([place])   . Detached retina   . Diabetes mellitus without complication ([place])    type 2  . Diabetic neuropathy ([place])   . Diabetic retinopathy ([place])    legally blind  . Hyperlipidemia   . Hypertension   . Myocardial infarction ([place])   . Obesity   . PONV (postoperative nausea and vomiting)   . Sciatica of right side    going to see physiciatry    Past Surgical History:  Procedure Laterality Date  . COLONOSCOPY WITH PROPOFOL N/A 01/26/2016   Procedure: COLONOSCOPY WITH PROPOFOL;  Surgeon: Lollie Sails, MD;  Location[facility] ENDOSCOPY;  Service: Endoscopy;  Laterality: N/A;  . COLONOSCOPY WITH PROPOFOL N/A 01/27/2016   Procedure: COLONOSCOPY WITH PROPOFOL;  Surgeon: Lollie Sails, MD;  Location: [facility] ENDOSCOPY;  Service: Endoscopy;  Laterality: N/A;  . CORONARY ARTERY BYPASS GRAFT  2013  . INCISION AND DRAINAGE     chest abscess  . INCISION AND DRAINAGE ABSCESS  N/A 12/11/2018   Procedure: INCISION AND DRAINAGE PERINEAL;  Surgeon: Jules Husbands, MD;  Location: [facility] ORS;  Service: General;  Laterality: N/A;  . RETINAL LASER PROCEDURE       reports that she has never smoked. She has never used smokeless tobacco. She reports that she does not drink alcohol or use drugs.  Allergies  Allergen Reactions  . Ozempic (0.25 Or 0.5 Mg-Dose) [Semaglutide(0.25 Or 0.5mg -Dos)] Diarrhea and Nausea  Only  . Trulicity [Dulaglutide] Diarrhea and Nausea Only    Family History  Problem Relation Age of Onset  . Breast cancer Mother   . Prostate cancer Father        we think mets to liver and bone     Prior to Admission medications   Medication Sig Start Date End Date Taking? Authorizing Provider  acetaminophen (TYLENOL) 500 MG tablet Take 1,000 mg by mouth every 8 (eight) hours as needed for pain. 12/18/18  Yes [provider]  allopurinol (ZYLOPRIM) 300 MG tablet Take 300 mg by mouth daily. 05/05/20  Yes [provider]  amLODipine (NORVASC) 10 MG tablet Take 10 mg by mouth at bedtime. 12/04/18  Yes [provider]  amoxicillin (AMOXIL) 500 MG capsule Take 1 capsule (500 mg total) by mouth every 8 (eight) hours. 05/15/20  Yes Allie Bossier, MD  aspirin EC 81 MG tablet Take 81 mg by mouth every morning.   Yes [provider]  clopidogrel (PLAVIX) 75 MG tablet Take 1 tablet (75 mg total) by mouth daily. 07/23/19  Yes Vaughan Basta, MD  hydrALAZINE (APRESOLINE) 25 MG tablet Take 3 tablets (75 mg total) by mouth 3 (three) times daily. 05/15/20  Yes Allie Bossier, MD  Insulin Glargine (LANTUS SOLOSTAR) 100 UNIT/ML Solostar Pen Inject 80 Units into the skin at bedtime. 10/07/18  Yes [provider]  insulin lispro (HUMALOG) 100 UNIT/ML KwikPen Inject 15-30 Units into the skin See admin instructions. Pt. Uses 15 un/ml in the mornings, 25un/ml in the afternoon, and 30 un/ml at bedtime 10/02/18  Yes [provider]  isosorbide mononitrate (IMDUR) 30 MG 24 hr tablet Take 1 tablet (30 mg total) by mouth daily. 05/16/20  Yes Allie Bossier, MD  metoprolol tartrate (LOPRESSOR) 50 MG tablet Take 50 mg by mouth 2 (two) times daily.    Yes [provider]  rosuvastatin (CRESTOR) 20 MG tablet Take 20 mg by mouth at bedtime.  03/13/20  Yes [provider]  fenofibrate 160 MG tablet Take 160 mg by mouth daily.    [provider]  furosemide (LASIX) 20 MG tablet Take 20 mg by mouth.    [provider]    Physical Exam: Vitals:   05/20/20 1130 05/20/20 1330 05/20/20 1400 05/20/20 1430  BP: (!) 160/59 (!) 156/52 (!) 157/56 (!) 168/75  Pulse: 77 77 81 73  Resp: 15 17 19 16   Temp:      TempSrc:      SpO2: 95% 95% 96% 96%  Weight:      Height:         Vitals:   05/20/20 1130 05/20/20 1330 05/20/20 1400 05/20/20 1430  BP: (!) 160/59 (!) 156/52 (!) 157/56 (!) 168/75  Pulse: 77 77 81 73  Resp: 15 17 19 16   Temp:      TempSrc:      SpO2: 95% 95% 96% 96%  Weight:      Height:        Constitutional: NAD, alert and  oriented x 3.  Morbidly obese.  Appears comfortable in no distress Eyes: PERRL, lids and conjunctivae pale ENMT: Mucous membranes are moist.  Neck: normal, supple, no masses, no thyromegaly Respiratory: Rales at the bases, no wheezing, no crackles. Normal respiratory effort. No accessory muscle use.  Cardiovascular: Regular rate and rhythm, no murmurs / rubs / gallops. 1+ extremity edema. 2+ pedal pulses. No carotid bruits.  Abdomen: no tenderness, no masses palpated. No hepatosplenomegaly. Bowel sounds positive.  Central adiposity Musculoskeletal: no clubbing / cyanosis. No joint deformity upper and lower extremities.  Skin: no rashes, lesions, ulcers.  Neurologic: No gross focal neurologic deficit.  GI Dr. Psychiatric: Normal mood and affect.   Labs on Admission: I have personally reviewed following labs and imaging studies  CBC: Recent Labs  Lab 05/14/20 0537 05/15/20 0512 05/20/20 1027  WBC 9.7 10.5 15.0*  HGB 8.9* 8.6* 8.5*  HCT 26.9* 26.3* 25.4*  MCV 86.8 88.0 87.6  PLT 250 260 335   Basic Metabolic Panel: Recent Labs  Lab 05/14/20 0537 05/15/20 0512 05/20/20 1027  NA 138 140 137  K 4.4 4.6 3.8  CL 111 112* 106  CO2 18* 17* 19*  GLUCOSE 170* 160* 188*  BUN 78* 74* 58*  CREATININE 3.60* 3.49* 3.28*  CALCIUM 8.7* 9.0 8.9  PHOS 5.2* 5.3*  --     GFR: Estimated Creatinine Clearance: 24.3 mL/min (A) (by C-G formula based on SCr of 3.28 mg/dL (H)). Liver Function Tests: Recent Labs  Lab 05/14/20 0537 05/15/20 0512  AST  --  34  ALT  --  56*  ALKPHOS  --  54  BILITOT  --  0.5  PROT  --  7.1  ALBUMIN 3.6 3.5   No results for input(s): LIPASE, AMYLASE in the last 168 hours. No results for input(s): AMMONIA in the last 168 hours. Coagulation Profile: No results for input(s): INR, PROTIME in the last 168 hours. Cardiac Enzymes: Recent Labs  Lab 05/14/20 0537 05/15/20 0512  CKTOTAL 2,227* 1,976*   BNP (last 3 results) No results for input(s): PROBNP in the last 8760 hours. HbA1C: No results for input(s): HGBA1C in the last 72 hours. CBG: Recent Labs  Lab 05/14/20 1253 05/14/20 1738 05/14/20 2215 05/15/20 0758 05/15/20 1205  GLUCAP 161* 199* 159* 154* 158*   Lipid Profile: No results for input(s): CHOL, HDL, LDLCALC, TRIG, CHOLHDL, LDLDIRECT in the last 72 hours. Thyroid Function Tests: No results for input(s): TSH, T4TOTAL, FREET4, T3FREE, THYROIDAB in the last 72 hours. Anemia Panel: No results for input(s): VITAMINB12, FOLATE, FERRITIN, TIBC, IRON, RETICCTPCT in the last 72 hours. Urine analysis:    Component Value Date/Time   COLORURINE STRAW (A) 05/12/2020 1409   APPEARANCEUR HAZY (A) 05/12/2020 1409   LABSPEC 1.011 05/12/2020 1409   PHURINE 5.0 05/12/2020 1409   GLUCOSEU 150 (A) 05/12/2020 1409   HGBUR LARGE (A) 05/12/2020 1409   BILIRUBINUR NEGATIVE 05/12/2020 1409   KETONESUR NEGATIVE 05/12/2020 1409   PROTEINUR 100 (A) 05/12/2020 1409   NITRITE NEGATIVE 05/12/2020 1409   LEUKOCYTESUR NEGATIVE 05/12/2020 1409    Radiological Exams on Admission: DG Chest 2 View  Result Date: 05/20/2020 CLINICAL DATA:  Shortness of breath EXAM: CHEST - 2 VIEW COMPARISON:  03/18/2020 FINDINGS: Post CABG. Stable cardiomediastinal contours. Atherosclerotic calcification of the aortic knob. Mild pulmonary vascular  congestion and bilateral perihilar interstitial prominence. Focal opacity within the peripheral aspect of the left lung base. Trace left pleural effusion. IMPRESSION: 1. Focal opacity within the peripheral aspect of  the left lung base, suspicious for pneumonia. 2. Mild pulmonary vascular congestion and bilateral perihilar interstitial prominence suggesting mild edema. Electronically Signed   By: Davina Poke D.O.   On: 05/20/2020 10:48   CT Chest Wo Contrast  Result Date: 05/20/2020 CLINICAL DATA:  Anemia.  Pneumonia.  Abnormal chest radiograph EXAM: CT CHEST WITHOUT CONTRAST TECHNIQUE: Multidetector CT imaging of the chest was performed following the standard protocol without IV contrast. COMPARISON:  Radiograph 05/20/2020 FINDINGS: Cardiovascular: Post CABG Mediastinum/Nodes: No axillary or supraclavicular adenopathy. No mediastinal or hilar adenopathy. No pericardial effusion. Esophagus normal. Lungs/Pleura: There is bibasilar atelectasis with mild consolidation and air bronchograms. Mild ground-glass opacities in lower lobes. Small bilateral pleural effusions. These findings are symmetric LEFT to RIGHT. No nodularity. Upper Abdomen: Limited view of the liver, kidneys, pancreas are unremarkable. Normal adrenal glands. Musculoskeletal: No aggressive osseous lesion. IMPRESSION: 1. Bibasilar atelectasis and effusions. Bibasilar infiltrates are less favored. 2. No adenopathy. Electronically Signed   By: Suzy Bouchard M.D.   On: 05/20/2020 14:07    EKG: Independently reviewed. Normal sinus rhythm LVH  Assessment/Plan Principal Problem:   Acute on chronic diastolic CHF (congestive heart failure) (HCC) Active Problems:   Anemia in chronic kidney disease   CKD (chronic kidney disease), stage IV (HCC)   Coronary artery disease   Type 2 diabetes mellitus with renal complication (HCC)   Morbid obesity with BMI of 45.0-49.9, adult (HCC)     Acute on chronic diastolic dysfunction CHF Patient  presents for evaluation of shortness of breath associated with orthopnea lower extremity swelling. Last known LVEF is 55 - 60% We will place patient on Lasix 60 mg IV every 12 Maintain low-sodium diet Optimize blood pressure control Continue nitrates, hydralazine and metoprolol   Hypertension  Blood pressure not at goal Continue nitrates, hydralazine and metoprolol   Diabetes mellitus with complications of stage IV chronic kidney disease Patient with CHF and metabolic acidosis but without evidence of hyperkalemia. We will consult nephrology Patient will need renal replacement therapy in the near future Maintain consistent carbohydrate diet Glycemic control with sliding scale insulin Continue long-acting insulin   Morbid obesity (BMI 39) Complicates overall prognosis and care   Anemia of chronic kidney disease H&H is stable   Elevated troponin Most likely secondary to demand ischemia from acute CHF exacerbation Patient has no EKG changes Continue aspirin and beta-blockers We will request cardiology consult   History of coronary artery disease Continue aspirin, Plavix, nitrates and beta-blockers   DVT prophylaxis: Heparin Code Status: Full code Family Communication: Greater than 50% of time was spent discussing plan of care with patient at the bedside.  All questions and concerns have been addressed. Disposition Plan: Back to previous home environment Consults called: Cardiology/Nephrology     Encarnacion Bole MD [facility]     05/20/2020, 2:42 PM

## 2020-05-21 DIAGNOSIS — E1121 Type 2 diabetes mellitus with diabetic nephropathy: Secondary | ICD-10-CM

## 2020-05-21 DIAGNOSIS — E669 Obesity, unspecified: Secondary | ICD-10-CM

## 2020-05-21 LAB — CBC WITH DIFFERENTIAL/PLATELET
Abs Immature Granulocytes: 0.07 10*3/uL (ref 0.00–0.07)
Basophils Absolute: 0.1 10*3/uL (ref 0.0–0.1)
Basophils Relative: 1 %
Eosinophils Absolute: 0.2 10*3/uL (ref 0.0–0.5)
Eosinophils Relative: 2 %
HCT: 23.8 % — ABNORMAL LOW (ref 36.0–46.0)
Hemoglobin: 7.8 g/dL — ABNORMAL LOW (ref 12.0–15.0)
Immature Granulocytes: 1 %
Lymphocytes Relative: 10 %
Lymphs Abs: 1.4 10*3/uL (ref 0.7–4.0)
MCH: 28.9 pg (ref 26.0–34.0)
MCHC: 32.8 g/dL (ref 30.0–36.0)
MCV: 88.1 fL (ref 80.0–100.0)
Monocytes Absolute: 1.1 10*3/uL — ABNORMAL HIGH (ref 0.1–1.0)
Monocytes Relative: 8 %
Neutro Abs: 10.6 10*3/uL — ABNORMAL HIGH (ref 1.7–7.7)
Neutrophils Relative %: 78 %
Platelets: 267 10*3/uL (ref 150–400)
RBC: 2.7 MIL/uL — ABNORMAL LOW (ref 3.87–5.11)
RDW: 15.3 % (ref 11.5–15.5)
WBC: 13.5 10*3/uL — ABNORMAL HIGH (ref 4.0–10.5)
nRBC: 0 % (ref 0.0–0.2)

## 2020-05-21 LAB — BASIC METABOLIC PANEL
Anion gap: 12 (ref 5–15)
BUN: 66 mg/dL — ABNORMAL HIGH (ref 6–20)
CO2: 20 mmol/L — ABNORMAL LOW (ref 22–32)
Calcium: 8.7 mg/dL — ABNORMAL LOW (ref 8.9–10.3)
Chloride: 103 mmol/L (ref 98–111)
Creatinine, Ser: 3.76 mg/dL — ABNORMAL HIGH (ref 0.44–1.00)
GFR calc Af Amer: 15 mL/min — ABNORMAL LOW (ref >60)
GFR calc non Af Amer: 13 mL/min — ABNORMAL LOW (ref >60)
Glucose, Bld: 324 mg/dL — ABNORMAL HIGH (ref 70–99)
Potassium: 3.7 mmol/L (ref 3.5–5.1)
Sodium: 135 mmol/L (ref 135–145)

## 2020-05-21 LAB — MAGNESIUM: Magnesium: 1.8 mg/dL (ref 1.7–2.4)

## 2020-05-21 LAB — GLUCOSE, CAPILLARY
Glucose-Capillary: 243 mg/dL — ABNORMAL HIGH (ref 70–99)
Glucose-Capillary: 242 mg/dL — ABNORMAL HIGH (ref 70–99)
Glucose-Capillary: 219 mg/dL — ABNORMAL HIGH (ref 70–99)
Glucose-Capillary: 278 mg/dL — ABNORMAL HIGH (ref 70–99)

## 2020-05-21 LAB — PHOSPHORUS: Phosphorus: 3.9 mg/dL (ref 2.5–4.6)

## 2020-05-21 MED ORDER — HYDRALAZINE HCL 25 MG PO TABS
25.0000 mg | ORAL_TABLET | Freq: Three times a day (TID) | ORAL | Status: DC
  Filled 2020-05-21: qty 1

## 2020-05-21 NOTE — Progress Notes (Signed)
Patients states "I do not feel well enough to walk at this time" after taking hydralazine she feels like she is going to pass out. Made dr. Alfredia Ferguson aware. Per md will change scheduled hydralazine to 25mg  tid, and to hold the 2200 dose for now. Will continue to monitor

## 2020-05-21 NOTE — Progress Notes (Addendum)
Inpatient Diabetes Program Recommendations  AACE/ADA: New Consensus Statement on Inpatient Glycemic Control (2015)  Target Ranges:  Prepandial:   less than 140 mg/dL      Peak postprandial:   less than 180 mg/dL (1-2 hours)      Critically ill patients:  140 - 180 mg/dL   Lab Results  Component Value Date   GLUCAP 243 (H) 05/21/2020   HGBA1C 8.5 (H) 05/12/2020    Review of Glycemic Control Results for Sharon Arias, Sharon Arias (MRN [id number]) as of 05/21/2020 11:25  Ref. Range 05/20/2020 17:29 05/20/2020 21:04 05/21/2020 08:01  Glucose-Capillary Latest Ref Range: 70 - 99 mg/dL 219 (H) 337 (H) 243 (H)   Diabetes history: DM2 Outpatient Diabetes medications: Lantus 80 units qd + Humalog 15 units am + 25 units afternoon + 30 units hs Current orders for Inpatient glycemic control: Lantus 40 units qd + Novolog correction moderate scale tid  Inpatient Diabetes Program Recommendations:   -Increase Lantus to 50 units qd -Add Novolog 10 units tid meal coverage if eats 50%  2:00 Spoke with patient @ bedside and discussed patient's regimen of taking her insulin @ home. Patient does take insulin ac breakfast (around 10:30) + ac lunch (afternoon) + evening ac dinner. Patient has a libre sensor on and has been adjusting to checking CBGs with her sensor and reflecting on nutrition.  Thank you, Nani Gasser. Braylee Bosher, RN, MSN, CDE  Diabetes Coordinator Inpatient Glycemic Control Team Team Pager [phone] (8am-5pm) 05/21/2020 11:31 AM

## 2020-05-21 NOTE — Progress Notes (Signed)
[facility]  ROUNDING NOTE   Subjective:  Patient well-known to [place] as an outpatient. We follow her for chronic kidney disease stage IV. Renal function appears to be a bit worse at the moment with an EGFR of 13. Previously in our office EGFR was found to be 23. Patient presented with significant shortness of breath and orthopnea. Shortness of breath likely multifactorial with contributions from pneumonia as well as acute on chronic diastolic heart failure. She has had good diuretic response. She is currently on Lasix 60 mg IV every 12 hours  Objective:  Vital signs in last 24 hours:  Temp:  [97.7 F (36.5 C)-98.5 F (36.9 C)] 98.1 F (36.7 C) (06/02 1623) Pulse Rate:  [60-68] 60 (06/02 1742) Resp:  [18] 18 (06/02 0755) BP: (101-133)/(43-54) 133/54 (06/02 1742) SpO2:  [95 %-97 %] 97 % (06/02 1742) Weight:  [113.8 kg] 113.8 kg (06/02 0542)  Weight change:  Filed Weights   05/20/20 1025 05/20/20 1903 05/21/20 0542  Weight: 113.4 kg 116 kg 113.8 kg    Intake/Output: I/O last 3 completed shifts: In: 63 [P.O.:420] Out: 7741 [Urine:4050]   Intake/Output this shift:  No intake/output data recorded.  Physical Exam: General: No acute distress  Head: Normocephalic, atraumatic. Moist oral mucosal membranes  Eyes: Anicteric  Neck: Supple, trachea midline  Lungs:  Basilar rales, normal effort  Heart: S1S2 no rubs  Abdomen:  Soft, nontender, bowel sounds present  Extremities: 2+ peripheral edema.  Neurologic: Awake, alert, following commands  Skin: No lesions       Basic Metabolic Panel: Recent Labs  Lab 05/15/20 0512 05/20/20 1027 05/21/20 0448  NA 140 137 135  K 4.6 3.8 3.7  CL 112* 106 103  CO2 17* 19* 20*  GLUCOSE 160* 188* 324*  BUN 74* 58* 66*  CREATININE 3.49* 3.28* 3.76*  CALCIUM 9.0 8.9 8.7*  MG  --   --  1.8  PHOS 5.3*  --  3.9    Liver Function Tests: Recent Labs  Lab 05/15/20 0512  AST 34  ALT 56*  ALKPHOS 54  BILITOT 0.5  PROT  7.1  ALBUMIN 3.5   No results for input(s): LIPASE, AMYLASE in the last 168 hours. No results for input(s): AMMONIA in the last 168 hours.  CBC: Recent Labs  Lab 05/15/20 0512 05/20/20 1027 05/21/20 0448  WBC 10.5 15.0* 13.5*  NEUTROABS  --   --  10.6*  HGB 8.6* 8.5* 7.8*  HCT 26.3* 25.4* 23.8*  MCV 88.0 87.6 88.1  PLT 260 253 267    Cardiac Enzymes: Recent Labs  Lab 05/15/20 0512  CKTOTAL 1,976*    BNP: Invalid input(s): POCBNP  CBG: Recent Labs  Lab 05/20/20 2104 05/21/20 0801 05/21/20 1212 05/21/20 1707 05/21/20 2110  GLUCAP 337* 243* 242* 219* 278*    Microbiology: Results for orders placed or performed during the hospital encounter of 05/20/20  SARS Coronavirus 2 by RT PCR (hospital order, performed in [place] hospital lab) Nasopharyngeal Nasopharyngeal Swab     Status: None   Collection Time: 05/20/20 12:37 PM   Specimen: Nasopharyngeal Swab  Result Value Ref Range Status   SARS Coronavirus 2 NEGATIVE NEGATIVE Final    Comment: (NOTE) SARS-CoV-2 target nucleic acids are NOT DETECTED. The SARS-CoV-2 RNA is generally detectable in upper and lower respiratory specimens during the acute phase of infection. The lowest concentration of SARS-CoV-2 viral copies this assay can detect is 250 copies / mL. A negative result does not preclude SARS-CoV-2 infection  and should not be used as the sole basis for treatment or other patient management decisions.  A negative result may occur with improper specimen collection / handling, submission of specimen other than nasopharyngeal swab, presence of viral mutation(s) within the areas targeted by this assay, and inadequate number of viral copies (<250 copies / mL). A negative result must be combined with clinical observations, patient history, and epidemiological information. Fact Sheet for Patients:   StrictlyIdeas.no Fact Sheet for Healthcare  Providers: BankingDealers.co.za This test is not yet approved or cleared  by the [place] FDA and has been authorized for detection and/or diagnosis of SARS-CoV-2 by FDA under an Emergency Use Authorization (EUA).  This EUA will remain in effect (meaning this test can be used) for the duration of the COVID-19 declaration under Section 564(b)(1) of the Act, 21 U.S.C. section 360bbb-3(b)(1), unless the authorization is terminated or revoked sooner. Performed at [facility], [address] [place], [place] [address]   Culture, blood (routine x 2)     Status: None (Preliminary result)   Collection Time: 05/20/20 12:38 PM   Specimen: BLOOD  Result Value Ref Range Status   Specimen Description BLOOD BLOOD LEFT ARM  Final   Special Requests   Final    BOTTLES DRAWN AEROBIC AND ANAEROBIC Blood Culture adequate volume   Culture   Final    NO GROWTH < 24 HOURS Performed at [facility], [address], [place], [place] [address]    Report Status PENDING  Incomplete  Culture, blood (routine x 2)     Status: None (Preliminary result)   Collection Time: 05/20/20 12:38 PM   Specimen: BLOOD  Result Value Ref Range Status   Specimen Description BLOOD RIGHT ANTECUBITAL  Final   Special Requests   Final    BOTTLES DRAWN AEROBIC AND ANAEROBIC Blood Culture results may not be optimal due to an excessive volume of blood received in culture bottles   Culture   Final    NO GROWTH < 24 HOURS Performed at [facility], [address] [place], [place] [address]    Report Status PENDING  Incomplete    Coagulation Studies: No results for input(s): LABPROT, INR in the last 72 hours.  Urinalysis: No results for input(s): COLORURINE, LABSPEC, PHURINE, GLUCOSEU, HGBUR, BILIRUBINUR, KETONESUR, PROTEINUR, UROBILINOGEN, NITRITE, LEUKOCYTESUR in the last 72 hours.  Invalid input(s): APPERANCEUR    Imaging: DG Chest 2 View  Result Date:  05/20/2020 CLINICAL DATA:  Shortness of breath EXAM: CHEST - 2 VIEW COMPARISON:  03/18/2020 FINDINGS: Post CABG. Stable cardiomediastinal contours. Atherosclerotic calcification of the aortic knob. Mild pulmonary vascular congestion and bilateral perihilar interstitial prominence. Focal opacity within the peripheral aspect of the left lung base. Trace left pleural effusion. IMPRESSION: 1. Focal opacity within the peripheral aspect of the left lung base, suspicious for pneumonia. 2. Mild pulmonary vascular congestion and bilateral perihilar interstitial prominence suggesting mild edema. Electronically Signed   By: Davina Poke D.O.   On: 05/20/2020 10:48   CT Chest Wo Contrast  Result Date: 05/20/2020 CLINICAL DATA:  Anemia.  Pneumonia.  Abnormal chest radiograph EXAM: CT CHEST WITHOUT CONTRAST TECHNIQUE: Multidetector CT imaging of the chest was performed following the standard protocol without IV contrast. COMPARISON:  Radiograph 05/20/2020 FINDINGS: Cardiovascular: Post CABG Mediastinum/Nodes: No axillary or supraclavicular adenopathy. No mediastinal or hilar adenopathy. No pericardial effusion. Esophagus normal. Lungs/Pleura: There is bibasilar atelectasis with mild consolidation and air bronchograms. Mild ground-glass opacities in lower lobes. Small bilateral  pleural effusions. These findings are symmetric LEFT to RIGHT. No nodularity. Upper Abdomen: Limited view of the liver, kidneys, pancreas are unremarkable. Normal adrenal glands. Musculoskeletal: No aggressive osseous lesion. IMPRESSION: 1. Bibasilar atelectasis and effusions. Bibasilar infiltrates are less favored. 2. No adenopathy. Electronically Signed   By: Suzy Bouchard M.D.   On: 05/20/2020 14:07     Medications:   . sodium chloride     . allopurinol  300 mg Oral Daily  . amLODipine  10 mg Oral QHS  . aspirin EC  81 mg Oral [id number]  . clopidogrel  75 mg Oral Daily  . furosemide  40 mg Intravenous Once  . furosemide  60 mg  Intravenous Q12H  . heparin  5,000 Units Subcutaneous Q8H  . hydrALAZINE  25 mg Oral TID  . insulin aspart  0-15 Units Subcutaneous TID WC  . insulin glargine  40 Units Subcutaneous QHS  . isosorbide mononitrate  30 mg Oral Daily  . metoprolol tartrate  50 mg Oral BID  . sodium chloride flush  3 mL Intravenous Q12H   sodium chloride, acetaminophen, ondansetron (ZOFRAN) IV, sodium chloride flush  Assessment/ Plan:  59 y.o. female with past medical history of diastolic heart failure, coronary artery disease, chronic kidney disease stage IV baseline EGFR 23, history of CVA, diabetes mellitus type 2, hypertension, hyperlipidemia, legal blindness, history of myocardial infarction, obesity who was admitted with increasing shortness of breath.  1.  Acute kidney injury/chronic kidney disease stage IV baseline EGFR 23.  eGFR currently is quite low at 13.  A month ago her EGFR was 23.  Diastolic heart failure may be playing a role in her decreased renal function now.  Continue to cautiously use Lasix monitoring renal function closely.  Alternatively this could potentially represent progression of underlying kidney disease.  Check renal ultrasound to make sure no underlying obstruction.  2.  Anemia of chronic kidney disease.  Hemoglobin down to 7.8.  No urgent indication for transfusion but consider if hemoglobin drops to 7 or less.  3.  Acute on chronic diastolic heart failure.  Most recent ejection fraction was normal.  Continue cautious diuresis for now.   LOS: 1 Treyten Monestime 6/2/20219:42 PM

## 2020-05-21 NOTE — Progress Notes (Signed)
PROGRESS NOTE    Sharon Arias  RDE:[id number] DOB: 1961-12-18 DOA: 05/20/2020 PCP: Idelle Crouch, MD   Brief Narrative:  HPI per Dr. Collier Bullock on 05/20/20  Sharon Arias Sharon Arias is a 59 y.o. female with medical history significant for stage IV chronic kidney disease, chronic diastolic dysfunction CHF, coronary artery disease, morbid obesity, diabetes mellitus and hypertension who presents to the emergency room for evaluation of shortness of breath.  Patient was recently discharged from the hospital about a week ago.  Patient states that her symptoms started acutely 1 day prior to her admission and she feels like she is unable to catch her breath.  Symptoms have been constant without relief and is associated with orthopnea and lower extremity swelling.  Patient states that she has Lasix prescribed which she takes as needed for swelling and her last dose was 2 days ago. Patient denies having any fever, chills, cough, headache, nausea, vomiting and changes in her bowel habits.  She denies having any chest pain, diaphoresis or palpitations. Patient's pulse ox on room air in the field was 92% and she was placed on 2 L of oxygen by EMS. Chest x-ray shows focal opacity within the peripheral aspect of the left lung base,suspicious for pneumonia. Mild pulmonary vascular congestion and bilateral perihilar interstitial prominence suggesting mild edema. CT scan of the chest showed bibasilar atelectasis and effusions. Bibasilar infiltrates are less favored. No adenopathy. Twelve-lead EKG shows sinus rhythm with LVH Labs reveal elevated troponin levels down from her last hospitalization.  She has a white count of 15,000 and hemoglobin of 8.5 consistent with her baseline.   ED Course: Patient seen in the emergency room for evaluation of shortness of breath, orthopnea and lower extremity swelling.  She was placed on 2 L of oxygen in the field with pulse oximetry of 92% on room air.  Chest x-ray was suggestive  of CHF and possible pneumonia patient received vancomycin and cefepime in the emergency room.  She will be admitted to the hospital for further evaluation.  **Interim History  Respiratory status is improved however renal function slightly worsened.  Cardiology recommending continue diuretics and will reassess her volume status and renal function in a.m.  If no function is worsening we will consult nephrology in the a.m. do not suspect that she has an acute pneumonia so antibiotics have been stopped  Assessment & Plan:   Principal Problem:   Acute on chronic diastolic CHF (congestive heart failure) (HCC) Active Problems:   Anemia in chronic kidney disease   CKD (chronic kidney disease), stage IV (HCC)   Coronary artery disease   Type 2 diabetes mellitus with renal complication ([place])   Morbid obesity with BMI of 45.0-49.9, adult (HCC)  Acute on Chronic Diastolic Dysfunction CHF -Patient presents for evaluation of shortness of breath associated with orthopnea lower extremity swelling in the setting of not taking her Diuretics -On admission her BNP was 726.3 and CT of the chest wo Contrast was done and showed "Bibasilar atelectasis and effusions. Bibasilar infiltrates are less favored.  No adenopathy." -CXR done and showed "Focal opacity within the peripheral aspect of the left lung base, suspicious for pneumonia.  Mild pulmonary vascular congestion and bilateral perihilar interstitial prominence suggesting mild edema." -She received antibiotics in the ED but will not continue as likely the findings on her chest x-ray and CT scan are in the setting of volume overload -PCT was 0.14 and doubt PNA  -Leukocytosis is likely reactive at 15,000 and is now  trending down to 13,500 -Last known LVEF is 55 - 60% -Started patient on Lasix 60 mg IV every 12h -C/w Maintain low-sodium diet -Optimize blood pressure control -Continue nitrates, hydralazine and metoprolol -Strict I's and O's and Daily Weights;  she is -3.63 L since admission and weight is relatively the same but was down from 5 pounds yesterday and question the accuracy -Check chest x-ray in the a.m. -Check ambulatory home O2 screen prior to discharge -Continue to monitor for signs and symptoms of volume overload  Hypertension  -Blood pressure is improved -Continue with isosorbide mononitrate 30 mg p.o. daily, hydralazine 75 mg p.o. 3 times daily, metoprolol tartrate 50 mg p.o. twice daily -With IV diuresis as above  Diabetes Mellitus with complications of stage IV chronic kidney disease -Patient with CHF and metabolic acidosis but without evidence of hyperkalemia. -If renal function does not improve by the a.m. we will consult nephrology then -Patient will need renal replacement therapy in the near future -Maintain consistent carbohydrate diet -Glycemic control with sliding scale insulin -Continue long-acting insulin -CBGs ranging from 219-337  Acute on Chronic Kidney Disease Stage IV Metabolic Acidosis -Patient's BUN/creatinine went from 58/3.28 is now 66/3.76 and worsened in the setting of diuresis -Continues to remain volume overloaded so we will continue with IV diuresis as above  -May need nephrology consultation if renal function continues to worsen -Patient CO2 is now 20, chloride level is 103, anion gap is 12 -Her respiratory status is improved though -Avoid further nephrotoxic medications, contrast dyes, hypotension and renally adjust medications -Continue with blood pressure control as above -Repeat CMP in the AM  Anemia of Chronic Kidney Disease -Patient's Hgb/Hct went from 8.5/25.4 -> 7.8/23.8 -Check Anemia Panel in the AM -Continue to Monitor for S/Sx of Bleeding; Currently no overt bleeding noted -Repeat CBC in AM   Elevated Troponin -Most likely secondary to demand ischemia from acute CHF exacerbation allergy does not believe that she has true ACS -Patient has no EKG changes -Continue aspirin and  beta-blockers -Cardiology consulted for further evaluation and they feel that the patient has acute demand supply ischemia and they are recommending continuing current therapy as above and continue diuresis as well as deferring cardiac catheterization in light of the patient's renal function status and to further cardiac diagnostics.  They are recommending increasing activity and ambulating today  History of Coronary Artery Disease -Continue Aspirin 81 mg, Clopidogrel 75 mg po Daily, Metoprolol 50 mg po BID -C/w Hydralazine 75 mg po TID, Isosorbide Mononitrate 30 mg po Daily  -Cardiology consulted and they are recommending continue clopidogrel and deferring cardiac catheterization in light of the patient's renal status  Obesity -Estimated body mass index is 39.3 kg/m as calculated from the following:   Height as of this encounter: 5\' 7"  (1.702 m).   Weight as of this encounter: 113.8 kg. -Weight Loss and Dietary Counseling given   DVT prophylaxis: Heparin 5,000 sq q8h Code Status: FULL CODE  Family Communication: No family present at bedside  Disposition Plan: Remain inpatient for continued diuresis and further improvement of her respiratory status prior to discharging and will need PT OT to further evaluate and treat  Status is: Inpatient  Remains inpatient appropriate because:Unsafe d/c plan, IV treatments appropriate due to intensity of illness or inability to take PO and Inpatient level of care appropriate due to severity of illness   Dispo: The patient is from: Home              Anticipated d/c is  to: Home              Anticipated d/c date is: 1-2 days              Patient currently is not medically stable to d/c.  Consultants:   Cardiology    Procedures: None   Antimicrobials:  Anti-infectives (From admission, onward)   Start     Dose/Rate Route Frequency Ordered Stop   05/20/20 1500  vancomycin (VANCOREADY) IVPB 1500 mg/300 mL     1,500 mg 150 mL/hr over 120 Minutes  Intravenous  Once 05/20/20 1230 05/20/20 1914   05/20/20 1230  vancomycin (VANCOCIN) IVPB 1000 mg/200 mL premix     1,000 mg 200 mL/hr over 60 Minutes Intravenous  Once 05/20/20 1222 05/20/20 1503   05/20/20 1230  ceFEPIme (MAXIPIME) 2 g in sodium chloride 0.9 % 100 mL IVPB     2 g 200 mL/hr over 30 Minutes Intravenous  Once 05/20/20 1222 05/20/20 1618     Subjective: Family at bedside states that her respiratory status is much better and she is not as short of breath.  Denies any chest pain.  Feels better than yesterday and wanting to go home soon.  No other concerns or complaints at this time.  Objective: Vitals:   05/21/20 0755 05/21/20 0953 05/21/20 1623 05/21/20 1625  BP: (!) 130/45 (!) 131/49 (!) 101/43 (!) 127/47  Pulse: 64 67 62   Resp: 18     Temp: 97.7 F (36.5 C)  98.1 F (36.7 C)   TempSrc:   Oral   SpO2: 95%  95%   Weight:      Height:        Intake/Output Summary (Last 24 hours) at 05/21/2020 1635 Last data filed at 05/21/2020 1432 Gross per 24 hour  Intake 420 ml  Output 4050 ml  Net -3630 ml   Filed Weights   05/20/20 1025 05/20/20 1903 05/21/20 0542  Weight: 113.4 kg 116 kg 113.8 kg   Examination: Physical Exam:  Constitutional: WN/WD obese Caucasian female currently in NAD and appears calm and comfortable Eyes: Lids and conjunctivae normal, sclerae anicteric however she is blind and only sees shadows ENMT: External Ears, Nose appear normal. Grossly normal hearing. Mucous membranes are moist. Posterior pharynx clear of any exudate or lesions. Normal dentition.  Neck: Appears normal, supple, no cervical masses, normal ROM, no appreciable thyromegaly; no appreciable JVD Respiratory: Diminished to auscultation bilaterally, no wheezing, rales, rhonchi or crackles. Normal respiratory effort and patient is not tachypenic. No accessory muscle use.  Unlabored breathing Cardiovascular: RRR, no murmurs / rubs / gallops. S1 and S2 auscultated.  1+ extremity edema.   Abdomen: Soft, non-tender, distended secondary to body habitus. Bowel sounds positive.  GU: Deferred. Musculoskeletal: No clubbing / cyanosis of digits/nails. No joint deformity upper and lower extremities.  Skin: No rashes, lesions, ulcers on limited skin evaluation. No induration; Warm and dry.  Neurologic: CN 2-12 grossly intact with no focal deficits. Romberg sign and cerebellar reflexes not assessed.  Psychiatric: Normal judgment and insight. Alert and oriented x 3. Normal mood and appropriate affect.   Data Reviewed: I have personally reviewed following labs and imaging studies  CBC: Recent Labs  Lab 05/15/20 0512 05/20/20 1027 05/21/20 0448  WBC 10.5 15.0* 13.5*  NEUTROABS  --   --  10.6*  HGB 8.6* 8.5* 7.8*  HCT 26.3* 25.4* 23.8*  MCV 88.0 87.6 88.1  PLT 260 253 263   Basic Metabolic Panel: Recent Labs  Lab 05/15/20 0512 05/20/20 1027 05/21/20 0448  NA 140 137 135  K 4.6 3.8 3.7  CL 112* 106 103  CO2 17* 19* 20*  GLUCOSE 160* 188* 324*  BUN 74* 58* 66*  CREATININE 3.49* 3.28* 3.76*  CALCIUM 9.0 8.9 8.7*  MG  --   --  1.8  PHOS 5.3*  --  3.9   GFR: Estimated Creatinine Clearance: 21.2 mL/min (A) (by C-G formula based on SCr of 3.76 mg/dL (H)). Liver Function Tests: Recent Labs  Lab 05/15/20 0512  AST 34  ALT 56*  ALKPHOS 54  BILITOT 0.5  PROT 7.1  ALBUMIN 3.5   No results for input(s): LIPASE, AMYLASE in the last 168 hours. No results for input(s): AMMONIA in the last 168 hours. Coagulation Profile: No results for input(s): INR, PROTIME in the last 168 hours. Cardiac Enzymes: Recent Labs  Lab 05/15/20 0512  CKTOTAL 1,976*   BNP (last 3 results) No results for input(s): PROBNP in the last 8760 hours. HbA1C: No results for input(s): HGBA1C in the last 72 hours. CBG: Recent Labs  Lab 05/15/20 1205 05/20/20 1729 05/20/20 2104 05/21/20 0801 05/21/20 1212  GLUCAP 158* 219* 337* 243* 242*   Lipid Profile: No results for input(s): CHOL,  HDL, LDLCALC, TRIG, CHOLHDL, LDLDIRECT in the last 72 hours. Thyroid Function Tests: No results for input(s): TSH, T4TOTAL, FREET4, T3FREE, THYROIDAB in the last 72 hours. Anemia Panel: No results for input(s): VITAMINB12, FOLATE, FERRITIN, TIBC, IRON, RETICCTPCT in the last 72 hours. Sepsis Labs: Recent Labs  Lab 05/20/20 1237  PROCALCITON 0.14    Recent Results (from the past 240 hour(s))  Urine Culture     Status: Abnormal   Collection Time: 05/12/20  1:43 PM   Specimen: Urine, Random  Result Value Ref Range Status   Specimen Description   Final    URINE, RANDOM Performed at [facility], [address] [place], [place] [address]    Special Requests   Final    NONE Performed at [facility], [address] [place], [place] [address]    Culture >=100,000 COLONIES/mL ENTEROCOCCUS FAECALIS (A)  Final   Report Status 05/15/2020 FINAL  Final   Organism ID, Bacteria ENTEROCOCCUS FAECALIS (A)  Final      Susceptibility   Enterococcus faecalis - MIC*    AMPICILLIN <=2 SENSITIVE Sensitive     NITROFURANTOIN <=16 SENSITIVE Sensitive     VANCOMYCIN 1 SENSITIVE Sensitive     * >=100,000 COLONIES/mL ENTEROCOCCUS FAECALIS  SARS Coronavirus 2 by RT PCR (hospital order, performed in [place] hospital lab) Nasopharyngeal Nasopharyngeal Swab     Status: None   Collection Time: 05/12/20  5:41 PM   Specimen: Nasopharyngeal Swab  Result Value Ref Range Status   SARS Coronavirus 2 NEGATIVE NEGATIVE Final    Comment: (NOTE) SARS-CoV-2 target nucleic acids are NOT DETECTED. The SARS-CoV-2 RNA is generally detectable in upper and lower respiratory specimens during the acute phase of infection. The lowest concentration of SARS-CoV-2 viral copies this assay can detect is 250 copies / mL. A negative result does not preclude SARS-CoV-2 infection and should not be used as the sole basis for treatment or other patient management decisions.  A negative result may occur  with improper specimen collection / handling, submission of specimen other than nasopharyngeal swab, presence of viral mutation(s) within the areas targeted by this assay, and inadequate number of viral copies (<250 copies / mL). A negative result must be combined with clinical observations,  patient history, and epidemiological information. Fact Sheet for Patients:   StrictlyIdeas.no Fact Sheet for Healthcare Providers: BankingDealers.co.za This test is not yet approved or cleared  by the [place] FDA and has been authorized for detection and/or diagnosis of SARS-CoV-2 by FDA under an Emergency Use Authorization (EUA).  This EUA will remain in effect (meaning this test can be used) for the duration of the COVID-19 declaration under Section 564(b)(1) of the Act, 21 U.S.C. section 360bbb-3(b)(1), unless the authorization is terminated or revoked sooner. Performed at [facility], [address] [place], [place] [address]   SARS Coronavirus 2 by RT PCR (hospital order, performed in [facility] hospital lab) Nasopharyngeal Nasopharyngeal Swab     Status: None   Collection Time: 05/20/20 12:37 PM   Specimen: Nasopharyngeal Swab  Result Value Ref Range Status   SARS Coronavirus 2 NEGATIVE NEGATIVE Final    Comment: (NOTE) SARS-CoV-2 target nucleic acids are NOT DETECTED. The SARS-CoV-2 RNA is generally detectable in upper and lower respiratory specimens during the acute phase of infection. The lowest concentration of SARS-CoV-2 viral copies this assay can detect is 250 copies / mL. A negative result does not preclude SARS-CoV-2 infection and should not be used as the sole basis for treatment or other patient management decisions.  A negative result may occur with improper specimen collection / handling, submission of specimen other than nasopharyngeal swab, presence of viral mutation(s) within the areas targeted by this assay,  and inadequate number of viral copies (<250 copies / mL). A negative result must be combined with clinical observations, patient history, and epidemiological information. Fact Sheet for Patients:   StrictlyIdeas.no Fact Sheet for Healthcare Providers: BankingDealers.co.za This test is not yet approved or cleared  by the [place] FDA and has been authorized for detection and/or diagnosis of SARS-CoV-2 by FDA under an Emergency Use Authorization (EUA).  This EUA will remain in effect (meaning this test can be used) for the duration of the COVID-19 declaration under Section 564(b)(1) of the Act, 21 U.S.C. section 360bbb-3(b)(1), unless the authorization is terminated or revoked sooner. Performed at [facility], [address] [place], [place] [address]   Culture, blood (routine x 2)     Status: None (Preliminary result)   Collection Time: 05/20/20 12:38 PM   Specimen: BLOOD  Result Value Ref Range Status   Specimen Description BLOOD BLOOD LEFT ARM  Final   Special Requests   Final    BOTTLES DRAWN AEROBIC AND ANAEROBIC Blood Culture adequate volume   Culture   Final    NO GROWTH < 24 HOURS Performed at [facility], [address] [place], [place] [address]    Report Status PENDING  Incomplete  Culture, blood (routine x 2)     Status: None (Preliminary result)   Collection Time: 05/20/20 12:38 PM   Specimen: BLOOD  Result Value Ref Range Status   Specimen Description BLOOD RIGHT ANTECUBITAL  Final   Special Requests   Final    BOTTLES DRAWN AEROBIC AND ANAEROBIC Blood Culture results may not be optimal due to an excessive volume of blood received in culture bottles   Culture   Final    NO GROWTH < 24 HOURS Performed at [facility], [address] [place], [place] [address]    Report Status PENDING  Incomplete     RN Pressure Injury Documentation:     Estimated body mass index is 39.3 kg/m  as calculated from the following:  Height as of this encounter: 5\' 7"  (1.702 m).   Weight as of this encounter: 113.8 kg.  Malnutrition Type:      Malnutrition Characteristics:      Nutrition Interventions:    Radiology Studies: DG Chest 2 View  Result Date: 05/20/2020 CLINICAL DATA:  Shortness of breath EXAM: CHEST - 2 VIEW COMPARISON:  03/18/2020 FINDINGS: Post CABG. Stable cardiomediastinal contours. Atherosclerotic calcification of the aortic knob. Mild pulmonary vascular congestion and bilateral perihilar interstitial prominence. Focal opacity within the peripheral aspect of the left lung base. Trace left pleural effusion. IMPRESSION: 1. Focal opacity within the peripheral aspect of the left lung base, suspicious for pneumonia. 2. Mild pulmonary vascular congestion and bilateral perihilar interstitial prominence suggesting mild edema. Electronically Signed   By: Davina Poke D.O.   On: 05/20/2020 10:48   CT Chest Wo Contrast  Result Date: 05/20/2020 CLINICAL DATA:  Anemia.  Pneumonia.  Abnormal chest radiograph EXAM: CT CHEST WITHOUT CONTRAST TECHNIQUE: Multidetector CT imaging of the chest was performed following the standard protocol without IV contrast. COMPARISON:  Radiograph 05/20/2020 FINDINGS: Cardiovascular: Post CABG Mediastinum/Nodes: No axillary or supraclavicular adenopathy. No mediastinal or hilar adenopathy. No pericardial effusion. Esophagus normal. Lungs/Pleura: There is bibasilar atelectasis with mild consolidation and air bronchograms. Mild ground-glass opacities in lower lobes. Small bilateral pleural effusions. These findings are symmetric LEFT to RIGHT. No nodularity. Upper Abdomen: Limited view of the liver, kidneys, pancreas are unremarkable. Normal adrenal glands. Musculoskeletal: No aggressive osseous lesion. IMPRESSION: 1. Bibasilar atelectasis and effusions. Bibasilar infiltrates are less favored. 2. No adenopathy. Electronically Signed   By: Suzy Bouchard M.D.   On: 05/20/2020 14:07   Scheduled Meds:  allopurinol  300 mg Oral Daily   amLODipine  10 mg Oral QHS   aspirin EC  81 mg Oral BH-q7a   clopidogrel  75 mg Oral Daily   furosemide  40 mg Intravenous Once   furosemide  60 mg Intravenous Q12H   heparin  5,000 Units Subcutaneous Q8H   hydrALAZINE  75 mg Oral TID   insulin aspart  0-15 Units Subcutaneous TID WC   insulin glargine  40 Units Subcutaneous QHS   isosorbide mononitrate  30 mg Oral Daily   metoprolol tartrate  50 mg Oral BID   sodium chloride flush  3 mL Intravenous Q12H   Continuous Infusions:  sodium chloride      LOS: 1 day   Kerney Elbe, DO [facility] PAGER is on AMION  If 7PM-7AM, please contact night-coverage [email or web address]

## 2020-05-21 NOTE — Progress Notes (Signed)
[facility] Cardiology  SUBJECTIVE: Patient laying in bed, reports feeling much better, denies angina, with improved breathing   Vitals:   05/20/20 1730 05/20/20 1800 05/20/20 1903 05/21/20 0542  BP: (!) 162/48 (!) 172/66 (!) 142/53 (!) 133/48  Pulse: 78 81 83 68  Resp: 19 19 20    Temp:   98.8 F (37.1 C) 98.5 F (36.9 C)  TempSrc:   Oral Oral  SpO2: 95% 95% 97% 95%  Weight:   116 kg 113.8 kg  Height:   5\' 7"  (1.702 m)      Intake/Output Summary (Last 24 hours) at 05/21/2020 0747 Last data filed at 05/21/2020 0546 Gross per 24 hour  Intake 360 ml  Output 2850 ml  Net -2490 ml      PHYSICAL EXAM  General: Well developed, well nourished, in no acute distress HEENT:  Normocephalic and atramatic Neck:  No JVD.  Lungs: Clear bilaterally to auscultation and percussion. Heart: HRRR . Normal S1 and S2 without gallops or murmurs.  Abdomen: Bowel sounds are positive, abdomen soft and non-tender  Msk:  Back normal, normal gait. Normal strength and tone for age. Extremities: No clubbing, cyanosis or edema.   Neuro: Alert and oriented X 3. Psych:  Good affect, responds appropriately   LABS: Basic Metabolic Panel: Recent Labs    05/20/20 1027 05/21/20 0448  NA 137 135  K 3.8 3.7  CL 106 103  CO2 19* 20*  GLUCOSE 188* 324*  BUN 58* 66*  CREATININE 3.28* 3.76*  CALCIUM 8.9 8.7*   Liver Function Tests: No results for input(s): AST, ALT, ALKPHOS, BILITOT, PROT, ALBUMIN in the last 72 hours. No results for input(s): LIPASE, AMYLASE in the last 72 hours. CBC: Recent Labs    05/20/20 1027  WBC 15.0*  HGB 8.5*  HCT 25.4*  MCV 87.6  PLT 253   Cardiac Enzymes: No results for input(s): CKTOTAL, CKMB, CKMBINDEX, TROPONINI in the last 72 hours. BNP: Invalid input(s): POCBNP D-Dimer: No results for input(s): DDIMER in the last 72 hours. Hemoglobin A1C: No results for input(s): HGBA1C in the last 72 hours. Fasting Lipid Panel: No results for input(s): CHOL, HDL, LDLCALC, TRIG,  CHOLHDL, LDLDIRECT in the last 72 hours. Thyroid Function Tests: No results for input(s): TSH, T4TOTAL, T3FREE, THYROIDAB in the last 72 hours.  Invalid input(s): FREET3 Anemia Panel: No results for input(s): VITAMINB12, FOLATE, FERRITIN, TIBC, IRON, RETICCTPCT in the last 72 hours.  DG Chest 2 View  Result Date: 05/20/2020 CLINICAL DATA:  Shortness of breath EXAM: CHEST - 2 VIEW COMPARISON:  03/18/2020 FINDINGS: Post CABG. Stable cardiomediastinal contours. Atherosclerotic calcification of the aortic knob. Mild pulmonary vascular congestion and bilateral perihilar interstitial prominence. Focal opacity within the peripheral aspect of the left lung base. Trace left pleural effusion. IMPRESSION: 1. Focal opacity within the peripheral aspect of the left lung base, suspicious for pneumonia. 2. Mild pulmonary vascular congestion and bilateral perihilar interstitial prominence suggesting mild edema. Electronically Signed   By: Davina Poke D.O.   On: 05/20/2020 10:48   CT Chest Wo Contrast  Result Date: 05/20/2020 CLINICAL DATA:  Anemia.  Pneumonia.  Abnormal chest radiograph EXAM: CT CHEST WITHOUT CONTRAST TECHNIQUE: Multidetector CT imaging of the chest was performed following the standard protocol without IV contrast. COMPARISON:  Radiograph 05/20/2020 FINDINGS: Cardiovascular: Post CABG Mediastinum/Nodes: No axillary or supraclavicular adenopathy. No mediastinal or hilar adenopathy. No pericardial effusion. Esophagus normal. Lungs/Pleura: There is bibasilar atelectasis with mild consolidation and air bronchograms. Mild ground-glass opacities in lower lobes.  Small bilateral pleural effusions. These findings are symmetric LEFT to RIGHT. No nodularity. Upper Abdomen: Limited view of the liver, kidneys, pancreas are unremarkable. Normal adrenal glands. Musculoskeletal: No aggressive osseous lesion. IMPRESSION: 1. Bibasilar atelectasis and effusions. Bibasilar infiltrates are less favored. 2. No  adenopathy. Electronically Signed   By: Suzy Bouchard M.D.   On: 05/20/2020 14:07     Echo LVEF 55 to 60% by 2D echocardiogram 03/19/2020  TELEMETRY: Sinus rhythm:  ASSESSMENT AND PLAN:  Principal Problem:   Acute on chronic diastolic CHF (congestive heart failure) (HCC) Active Problems:   Anemia in chronic kidney disease   CKD (chronic kidney disease), stage IV (HCC)   Coronary artery disease   Type 2 diabetes mellitus with renal complication ([place])   Morbid obesity with BMI of 45.0-49.9, adult ([place])    1.  Shortness of breath, likely multifactorial, secondary to left lower lobe pneumonia and acute on chronic diastolic congestive heart failure, much improved, not requiring supplemental oxygen 2.  Acute on chronic diastolic congestive heart failure, much improved after diuresis 3.  Elevated high-sensitivity troponin, likely demand supply ischemia 4.  CAD, status post CABG 2013, status post DES mid RCA, without chest pain 5.  CKD, stage IV, at very high risk for contrast-induced nephrotoxicity 6.  History of CVA  Recommendations  1.  Agree with current therapy 2.  Continue diuresis 3.  Carefully monitor renal status 4.  Continue clopidogrel 5.  Defer cardiac catheterization in light of patient's renal status 6.  Defer further cardiac diagnostics 7.  Increase activity, ambulate today   Isaias Cowman, MD, PhD, [facility] 05/21/2020 7:47 AM

## 2020-05-22 ENCOUNTER — Inpatient Hospital Stay: Payer: Medicare Other

## 2020-05-22 ENCOUNTER — Ambulatory Visit: Payer: Medicare Other

## 2020-05-22 LAB — GLUCOSE, CAPILLARY
Glucose-Capillary: 217 mg/dL — ABNORMAL HIGH (ref 70–99)
Glucose-Capillary: 344 mg/dL — ABNORMAL HIGH (ref 70–99)
Glucose-Capillary: 220 mg/dL — ABNORMAL HIGH (ref 70–99)
Glucose-Capillary: 211 mg/dL — ABNORMAL HIGH (ref 70–99)

## 2020-05-22 LAB — CBC WITH DIFFERENTIAL/PLATELET
Abs Immature Granulocytes: 0.07 10*3/uL (ref 0.00–0.07)
Basophils Absolute: 0.1 10*3/uL (ref 0.0–0.1)
Basophils Relative: 1 %
Eosinophils Absolute: 0.5 10*3/uL (ref 0.0–0.5)
Eosinophils Relative: 4 %
HCT: 24.5 % — ABNORMAL LOW (ref 36.0–46.0)
Hemoglobin: 8.1 g/dL — ABNORMAL LOW (ref 12.0–15.0)
Immature Granulocytes: 1 %
Lymphocytes Relative: 18 %
Lymphs Abs: 2.1 10*3/uL (ref 0.7–4.0)
MCH: 28.8 pg (ref 26.0–34.0)
MCHC: 33.1 g/dL (ref 30.0–36.0)
MCV: 87.2 fL (ref 80.0–100.0)
Monocytes Absolute: 1.1 10*3/uL — ABNORMAL HIGH (ref 0.1–1.0)
Monocytes Relative: 9 %
Neutro Abs: 8 10*3/uL — ABNORMAL HIGH (ref 1.7–7.7)
Neutrophils Relative %: 67 %
Platelets: 299 10*3/uL (ref 150–400)
RBC: 2.81 MIL/uL — ABNORMAL LOW (ref 3.87–5.11)
RDW: 14.8 % (ref 11.5–15.5)
WBC: 11.9 10*3/uL — ABNORMAL HIGH (ref 4.0–10.5)
nRBC: 0 % (ref 0.0–0.2)

## 2020-05-22 LAB — FERRITIN: Ferritin: 253 ng/mL (ref 11–307)

## 2020-05-22 LAB — IRON AND TIBC
Iron: 36 ug/dL (ref 28–170)
Saturation Ratios: 18 % (ref 10.4–31.8)
TIBC: 200 ug/dL — ABNORMAL LOW (ref 250–450)
UIBC: 164 ug/dL

## 2020-05-22 LAB — COMPREHENSIVE METABOLIC PANEL
ALT: 34 U/L (ref 0–44)
AST: 25 U/L (ref 15–41)
Albumin: 3.2 g/dL — ABNORMAL LOW (ref 3.5–5.0)
Alkaline Phosphatase: 61 U/L (ref 38–126)
Anion gap: 11 (ref 5–15)
BUN: 81 mg/dL — ABNORMAL HIGH (ref 6–20)
CO2: 22 mmol/L (ref 22–32)
Calcium: 8.9 mg/dL (ref 8.9–10.3)
Chloride: 101 mmol/L (ref 98–111)
Creatinine, Ser: 4.01 mg/dL — ABNORMAL HIGH (ref 0.44–1.00)
GFR calc Af Amer: 13 mL/min — ABNORMAL LOW (ref >60)
GFR calc non Af Amer: 12 mL/min — ABNORMAL LOW (ref >60)
Glucose, Bld: 260 mg/dL — ABNORMAL HIGH (ref 70–99)
Potassium: 3.5 mmol/L (ref 3.5–5.1)
Sodium: 134 mmol/L — ABNORMAL LOW (ref 135–145)
Total Bilirubin: 0.6 mg/dL (ref 0.3–1.2)
Total Protein: 7.2 g/dL (ref 6.5–8.1)

## 2020-05-22 LAB — PHOSPHORUS: Phosphorus: 4.9 mg/dL — ABNORMAL HIGH (ref 2.5–4.6)

## 2020-05-22 LAB — RETICULOCYTES
Immature Retic Fract: 18.1 % — ABNORMAL HIGH (ref 2.3–15.9)
RBC.: 2.85 MIL/uL — ABNORMAL LOW (ref 3.87–5.11)
Retic Count, Absolute: 72.1 10*3/uL (ref 19.0–186.0)
Retic Ct Pct: 2.5 % (ref 0.4–3.1)

## 2020-05-22 LAB — VITAMIN B12: Vitamin B-12: 221 pg/mL (ref 180–914)

## 2020-05-22 LAB — MAGNESIUM: Magnesium: 2 mg/dL (ref 1.7–2.4)

## 2020-05-22 LAB — FOLATE: Folate: 9.8 ng/mL (ref >5.9)

## 2020-05-22 MED ORDER — INSULIN ASPART 100 UNIT/ML ~~LOC~~ SOLN
10.0000 [IU] | Freq: Three times a day (TID) | SUBCUTANEOUS | Status: DC
  Administered 2020-05-22 – 2020-05-23 (×3): 10 [IU] via SUBCUTANEOUS
  Filled 2020-05-22 (×3): qty 1

## 2020-05-22 MED ORDER — SODIUM CHLORIDE 0.9 % IV SOLN
510.0000 mg | Freq: Once | INTRAVENOUS | Status: AC
  Administered 2020-05-22: 510 mg via INTRAVENOUS
  Filled 2020-05-22: qty 17

## 2020-05-22 MED ORDER — HYDRALAZINE HCL 25 MG PO TABS
25.0000 mg | ORAL_TABLET | Freq: Two times a day (BID) | ORAL | Status: DC
  Administered 2020-05-22 – 2020-05-24 (×4): 25 mg via ORAL
  Filled 2020-05-22 (×4): qty 1

## 2020-05-22 MED ORDER — FUROSEMIDE 10 MG/ML IJ SOLN
40.0000 mg | Freq: Two times a day (BID) | INTRAMUSCULAR | Status: DC
  Administered 2020-05-22 – 2020-05-24 (×4): 40 mg via INTRAVENOUS
  Filled 2020-05-22 (×4): qty 4

## 2020-05-22 MED ORDER — INSULIN GLARGINE 100 UNIT/ML ~~LOC~~ SOLN
50.0000 [IU] | Freq: Every day | SUBCUTANEOUS | Status: DC
  Administered 2020-05-22: 50 [IU] via SUBCUTANEOUS
  Filled 2020-05-22 (×2): qty 0.5

## 2020-05-22 NOTE — Evaluation (Signed)
Occupational Therapy Evaluation Patient Details Name: Sharon Arias MRN: [id number] DOB: July 22, 1961 Today's Date: 05/22/2020    History of Present Illness Sharon Arias is a 59 year old female who presented to ED with shortness of breath associated with orthopnea and lower extremity swelling.  Pt admitted for acute on chronic CHF.  Pt's PMH includes CKD, CAD, DM2, and diabetic retinopathy (legally blind).   Clinical Impression   [name] presents to OT with impaired balance and limited endurance that impacts her ability to safely and independently complete functional tasks.  Prior to admission, pt was independent in all ADLs but occasionally uses w/c for mobility when experiencing a gout flare up.  Pt is legally blind, but is able to compensate and navigate her own familiar environment. She relies on her husband for driving.  Pt's husband has advanced synovial sarcoma, so is limited in the amount of assistance he can provide for pt.  Currently, pt is close to baseline in ability to complete basic self care tasks.  OTR deferred transfer assessment 2/2 pt's low blood pressure, complaints of dizziness, and blood glucose of 344 when NA checked during OT session.  When pt's balance is not impaired by these external factors, suspect pt will be close to baseline in terms of transfer and mobility status.  OTR provided education re: chronic disease management and energy conservation strategies for improved safety and well being.  Pt demonstrates good understanding of chronic disease management needs and her daily routines.  [name] will continue to benefit from skilled OT services in acute setting to further assess transfer status and improve endurance.  Do not anticipate further OT needs post discharge.    Follow Up Recommendations  No OT follow up    Equipment Recommendations  None recommended by OT    Recommendations for Other Services       Precautions / Restrictions Precautions Precautions:  Fall Restrictions Weight Bearing Restrictions: No      Mobility Bed Mobility Overal bed mobility: Modified Independent                Transfers                 General transfer comment: not tested 2/2 high blood sugar and low blood pressure    Balance Overall balance assessment: Needs assistance Sitting-balance support: Feet supported;No upper extremity supported Sitting balance-Leahy Scale: Normal         Standing balance comment: not tested                           ADL either performed or assessed with clinical judgement   ADL Overall ADL's : At baseline                                       General ADL Comments: Pt has full strength and ROM to complete basic ADLs independently. Pt's balance is currently limited by low blood pressure, but suspect 2/2 medications.     Vision Baseline Vision/History: Legally blind;Retinopathy(pt able to see shapes and shadows, no longer wears glasses) Patient Visual Report: No change from baseline       Perception     Praxis      Pertinent Vitals/Pain Pain Assessment: No/denies pain     Hand Dominance     Extremity/Trunk Assessment Upper Extremity Assessment Upper Extremity Assessment: Overall WFL for  tasks assessed   Lower Extremity Assessment Lower Extremity Assessment: Defer to PT evaluation       Communication Communication Communication: No difficulties   Cognition Arousal/Alertness: Awake/alert Behavior During Therapy: WFL for tasks assessed/performed Overall Cognitive Status: Within Functional Limits for tasks assessed                                 General Comments: grossly oriented, pleasant and engaged in therapy   General Comments  Pt with reports of low blood pressure and dizziness last night and this morning, blood glucose at 344 when tested during session    Exercises Other Exercises Other Exercises: provided education re: OT role and plan of  care, fall and safety precautions, self care, chronic health condition management, energy conservation   Shoulder Instructions      Home Living Family/patient expects to be discharged to:: Private residence Living Arrangements: Spouse/significant other Available Help at Discharge: Family Type of Home: House Home Access: Stairs to enter Technical brewer of Steps: 1 Entrance Stairs-Rails: Right Home Layout: One level     Bathroom Shower/Tub: Occupational psychologist: Handicapped height     Home Equipment: Wheelchair - manual;Walker - standard;Cane - single point;Shower seat;Grab bars - tub/shower          Prior Functioning/Environment Level of Independence: Independent with assistive device(s)        Comments: Pt is independent in all ADLs, including cooking, dressing, bathing, toileting, and medication management.  Pt occasionally uses wheelchair for mobility when having a gout flareup.  Pt is legally blind, but is able to compensate and navigate household well.  Pt relies on her husband for driving.  Pt has good awareness of chronic health condition management, with daily weights and checking blood pressure 2x/day.  Pt pays someone to clean her home.        OT Problem List: Impaired balance (sitting and/or standing);Cardiopulmonary status limiting activity      OT Treatment/Interventions: Energy conservation;Balance training;Patient/family education;Therapeutic activities;Therapeutic exercise    OT Goals(Current goals can be found in the care plan section) Acute Rehab OT Goals Patient Stated Goal: to go home OT Goal Formulation: With patient Time For Goal Achievement: 06/05/20 Potential to Achieve Goals: Good  OT Frequency: Min 1X/week   Barriers to D/C:            Co-evaluation              AM-PAC OT "6 Clicks" Daily Activity     Outcome Measure Help from another person eating meals?: None Help from another person taking care of personal  grooming?: None Help from another person toileting, which includes using toliet, bedpan, or urinal?: A Little Help from another person bathing (including washing, rinsing, drying)?: A Little Help from another person to put on and taking off regular upper body clothing?: None Help from another person to put on and taking off regular lower body clothing?: A Little 6 Click Score: 21   End of Session    Activity Tolerance: Treatment limited secondary to medical complications (Comment) Patient left: in chair;with call bell/phone within reach;with chair alarm set  OT Visit Diagnosis: Unsteadiness on feet (R26.81)                Time: 1130-1145 OT Time Calculation (min): 15 min Charges:  OT General Charges $OT Visit: 1 Visit OT Evaluation $OT Eval Moderate Complexity: 1 Mod OT Treatments $Self Care/Home  Management : 8-22 mins  Myrtie Hawk Moriah Loughry, OTR/L 05/22/20, 12:37 PM

## 2020-05-22 NOTE — Progress Notes (Signed)
[facility] Cardiology    SUBJECTIVE: The patient reports feeling "100% better." She denies chest pain or shortness of breath. She reports palpitations and feeling drained soon after taking hydralazine.   Vitals:   05/21/20 1625 05/21/20 1741 05/21/20 1742 05/22/20 0341  BP: (!) 127/47 (!) 122/50 (!) 133/54 (!) 130/52  Pulse:  60 60 60  Resp:    18  Temp:    98.2 F (36.8 C)  TempSrc:    Oral  SpO2:  97% 97% 97%  Weight:    111 kg  Height:         Intake/Output Summary (Last 24 hours) at 05/22/2020 0758 Last data filed at 05/22/2020 2458 Gross per 24 hour  Intake 60 ml  Output 2000 ml  Net -1940 ml      PHYSICAL EXAM  General: Well developed, well nourished, in no acute distress, sitting up on side of bed eating breakfast HEENT:  Normocephalic and atramatic Neck:  No JVD.  Lungs: Clear bilaterally to auscultation, normal effort of breathing on room air. Heart: HRRR . Normal S1 and S2 without gallops or murmurs.  Abdomen: nondistended Msk:  Back normal.  Extremities: No clubbing, cyanosis or edema.   Neuro: Alert and oriented X 3. Psych:  Good affect, responds appropriately   LABS: Basic Metabolic Panel: Recent Labs    05/21/20 0448 05/22/20 0427  NA 135 134*  K 3.7 3.5  CL 103 101  CO2 20* 22  GLUCOSE 324* 260*  BUN 66* 81*  CREATININE 3.76* 4.01*  CALCIUM 8.7* 8.9  MG 1.8 2.0  PHOS 3.9 4.9*   Liver Function Tests: Recent Labs    05/22/20 0427  AST 25  ALT 34  ALKPHOS 61  BILITOT 0.6  PROT 7.2  ALBUMIN 3.2*   No results for input(s): LIPASE, AMYLASE in the last 72 hours. CBC: Recent Labs    05/21/20 0448 05/22/20 0427  WBC 13.5* 11.9*  NEUTROABS 10.6* 8.0*  HGB 7.8* 8.1*  HCT 23.8* 24.5*  MCV 88.1 87.2  PLT 267 299   Cardiac Enzymes: No results for input(s): CKTOTAL, CKMB, CKMBINDEX, TROPONINI in the last 72 hours. BNP: Invalid input(s): POCBNP D-Dimer: No results for input(s): DDIMER in the last 72 hours. Hemoglobin A1C: No results for  input(s): HGBA1C in the last 72 hours. Fasting Lipid Panel: No results for input(s): CHOL, HDL, LDLCALC, TRIG, CHOLHDL, LDLDIRECT in the last 72 hours. Thyroid Function Tests: No results for input(s): TSH, T4TOTAL, T3FREE, THYROIDAB in the last 72 hours.  Invalid input(s): FREET3 Anemia Panel: Recent Labs    05/22/20 0427  FOLATE 9.8  FERRITIN 253  TIBC 200*  IRON 36  RETICCTPCT 2.5    DG Chest 1 View  Result Date: 05/22/2020 CLINICAL DATA:  Short of breath EXAM: CHEST  1 VIEW COMPARISON:  05/20/2020 FINDINGS: Improved aeration in the lung bases. Lungs are now clear. No infiltrate effusion or edema. CABG changes noted. IMPRESSION: Interval clearing of bibasilar atelectasis.  No acute abnormality Electronically Signed   By: Franchot Gallo M.D.   On: 05/22/2020 07:53   DG Chest 2 View  Result Date: 05/20/2020 CLINICAL DATA:  Shortness of breath EXAM: CHEST - 2 VIEW COMPARISON:  03/18/2020 FINDINGS: Post CABG. Stable cardiomediastinal contours. Atherosclerotic calcification of the aortic knob. Mild pulmonary vascular congestion and bilateral perihilar interstitial prominence. Focal opacity within the peripheral aspect of the left lung base. Trace left pleural effusion. IMPRESSION: 1. Focal opacity within the peripheral aspect of the left lung base, suspicious  for pneumonia. 2. Mild pulmonary vascular congestion and bilateral perihilar interstitial prominence suggesting mild edema. Electronically Signed   By: Davina Poke D.O.   On: 05/20/2020 10:48   CT Chest Wo Contrast  Result Date: 05/20/2020 CLINICAL DATA:  Anemia.  Pneumonia.  Abnormal chest radiograph EXAM: CT CHEST WITHOUT CONTRAST TECHNIQUE: Multidetector CT imaging of the chest was performed following the standard protocol without IV contrast. COMPARISON:  Radiograph 05/20/2020 FINDINGS: Cardiovascular: Post CABG Mediastinum/Nodes: No axillary or supraclavicular adenopathy. No mediastinal or hilar adenopathy. No pericardial  effusion. Esophagus normal. Lungs/Pleura: There is bibasilar atelectasis with mild consolidation and air bronchograms. Mild ground-glass opacities in lower lobes. Small bilateral pleural effusions. These findings are symmetric LEFT to RIGHT. No nodularity. Upper Abdomen: Limited view of the liver, kidneys, pancreas are unremarkable. Normal adrenal glands. Musculoskeletal: No aggressive osseous lesion. IMPRESSION: 1. Bibasilar atelectasis and effusions. Bibasilar infiltrates are less favored. 2. No adenopathy. Electronically Signed   By: Suzy Bouchard M.D.   On: 05/20/2020 14:07     Echo LVEF 55-60%  TELEMETRY: sinus rhythm, 61 bpm  ASSESSMENT AND PLAN:  Principal Problem:   Acute on chronic diastolic CHF (congestive heart failure) (HCC) Active Problems:   Anemia in chronic kidney disease   CKD (chronic kidney disease), stage IV (HCC)   Coronary artery disease   Type 2 diabetes mellitus with renal complication ([place])   Morbid obesity with BMI of 45.0-49.9, adult ([place])    1. Acute on chronic diastolic CHF, overall clinical improvement with diuresis 2. AKI on CKD stage IV, followed by nephrology, creatinine increased to 4.01 today. On IV Lasix 60 mg 3. Elevated high sensitivity troponin, likely demand supply ischemia in the absence of chest pain or ECG changes. 4. Coronary artery disease, status post CABG in 2013, status post DES mid RCA, not complaining of chest pain  Recommendations: 1. Continue diuresis per nephrology recommendations as renal function is worsening 2. Continue aspirin and Plavix 3. Defer cardiac catheterization in light of patient's worsening renal status 4. Defer further cardiac diagnostics 5. Continue metoprolol, Crestor, Imdur, hydralazine at reduced frequency of twice daily, and amlodipine 6. Ambulate today, consider discharge.   Sharolyn Douglas 05/22/2020 7:58 AM  Discussed with Dr. Saralyn Pilar who agrees with the above plan. Sign off now; please call/[facility]  with questions.

## 2020-05-22 NOTE — Progress Notes (Signed)
Soft BP this morning and patient reported lightheadedness yesterday after taking all BP medications. Spoke with Dr. Alfredia Ferguson, ordered to hold imdur and hydralazine but give metoprolol. Patient agreeable with plan. Will continue to monitor.

## 2020-05-22 NOTE — Progress Notes (Signed)
PT Cancellation Note  Patient Details Name: AMESHIA PEWITT MRN: [id number] DOB: 1961/10/16   Cancelled Treatment:    Reason Eval/Treat Not Completed: Other (comment).  PT consult received.  Chart reviewed.  Pt currently not in room (not available for PT session): discussed with pt's nurse.  Will re-attempt PT evaluation at a later date/time as able.  Leitha Bleak, PT 05/22/20, 3:33 PM

## 2020-05-22 NOTE — Progress Notes (Signed)
[facility]  ROUNDING NOTE   Subjective:  Patient continues to have significant renal dysfunction.  Creatinine up to 4.01 with an EGFR of 12. Good urine output noted. Previously when diuretics were held she developed severe volume overload.  Objective:  Vital signs in last 24 hours:  Temp:  [97.7 F (36.5 C)-98.3 F (36.8 C)] 97.7 F (36.5 C) (06/03 1211) Pulse Rate:  [58-62] 58 (06/03 1211) Resp:  [18] 18 (06/03 1211) BP: (101-133)/(39-54) 120/39 (06/03 1211) SpO2:  [95 %-98 %] 95 % (06/03 1211) Weight:  [962 kg] 111 kg (06/03 0341)  Weight change: -2.404 kg Filed Weights   05/20/20 1903 05/21/20 0542 05/22/20 0341  Weight: 116 kg 113.8 kg 111 kg    Intake/Output: I/O last 3 completed shifts: In: 48 [P.O.:420] Out: 4300 [Urine:4300]   Intake/Output this shift:  Total I/O In: 240 [P.O.:240] Out: 900 [Urine:900]  Physical Exam: General: No acute distress  Head: Normocephalic, atraumatic. Moist oral mucosal membranes  Eyes: Anicteric  Neck: Supple, trachea midline  Lungs:  Basilar rales, normal effort  Heart: S1S2 no rubs  Abdomen:  Soft, nontender, bowel sounds present  Extremities: 2+ peripheral edema.  Neurologic: Awake, alert, following commands  Skin: No lesions       Basic Metabolic Panel: Recent Labs  Lab 05/20/20 1027 05/21/20 0448 05/22/20 0427  NA 137 135 134*  K 3.8 3.7 3.5  CL 106 103 101  CO2 19* 20* 22  GLUCOSE 188* 324* 260*  BUN 58* 66* 81*  CREATININE 3.28* 3.76* 4.01*  CALCIUM 8.9 8.7* 8.9  MG  --  1.8 2.0  PHOS  --  3.9 4.9*    Liver Function Tests: Recent Labs  Lab 05/22/20 0427  AST 25  ALT 34  ALKPHOS 61  BILITOT 0.6  PROT 7.2  ALBUMIN 3.2*   No results for input(s): LIPASE, AMYLASE in the last 168 hours. No results for input(s): AMMONIA in the last 168 hours.  CBC: Recent Labs  Lab 05/20/20 1027 05/21/20 0448 05/22/20 0427  WBC 15.0* 13.5* 11.9*  NEUTROABS  --  10.6* 8.0*  HGB 8.5* 7.8* 8.1*   HCT 25.4* 23.8* 24.5*  MCV 87.6 88.1 87.2  PLT 253 267 299    Cardiac Enzymes: No results for input(s): CKTOTAL, CKMB, CKMBINDEX, TROPONINI in the last 168 hours.  BNP: Invalid input(s): POCBNP  CBG: Recent Labs  Lab 05/21/20 1212 05/21/20 1707 05/21/20 2110 05/22/20 0823 05/22/20 1141  GLUCAP 242* 219* 278* 217* 344*    Microbiology: Results for orders placed or performed during the hospital encounter of 05/20/20  SARS Coronavirus 2 by RT PCR (hospital order, performed in [facility] hospital lab) Nasopharyngeal Nasopharyngeal Swab     Status: None   Collection Time: 05/20/20 12:37 PM   Specimen: Nasopharyngeal Swab  Result Value Ref Range Status   SARS Coronavirus 2 NEGATIVE NEGATIVE Final    Comment: (NOTE) SARS-CoV-2 target nucleic acids are NOT DETECTED. The SARS-CoV-2 RNA is generally detectable in upper and lower respiratory specimens during the acute phase of infection. The lowest concentration of SARS-CoV-2 viral copies this assay can detect is 250 copies / mL. A negative result does not preclude SARS-CoV-2 infection and should not be used as the sole basis for treatment or other patient management decisions.  A negative result may occur with improper specimen collection / handling, submission of specimen other than nasopharyngeal swab, presence of viral mutation(s) within the areas targeted by this assay, and inadequate number of viral copies (<  250 copies / mL). A negative result must be combined with clinical observations, patient history, and epidemiological information. Fact Sheet for Patients:   StrictlyIdeas.no Fact Sheet for Healthcare Providers: BankingDealers.co.za This test is not yet approved or cleared  by the [place] FDA and has been authorized for detection and/or diagnosis of SARS-CoV-2 by FDA under an Emergency Use Authorization (EUA).  This EUA will remain in effect (meaning this test can  be used) for the duration of the COVID-19 declaration under Section 564(b)(1) of the Act, 21 U.S.C. section 360bbb-3(b)(1), unless the authorization is terminated or revoked sooner. Performed at [facility], [address] [place], [place] [address]   Culture, blood (routine x 2)     Status: None (Preliminary result)   Collection Time: 05/20/20 12:38 PM   Specimen: BLOOD  Result Value Ref Range Status   Specimen Description BLOOD BLOOD LEFT ARM  Final   Special Requests   Final    BOTTLES DRAWN AEROBIC AND ANAEROBIC Blood Culture adequate volume   Culture   Final    NO GROWTH 2 DAYS Performed at [facility], [address] [place], [place] [address]    Report Status PENDING  Incomplete  Culture, blood (routine x 2)     Status: None (Preliminary result)   Collection Time: 05/20/20 12:38 PM   Specimen: BLOOD  Result Value Ref Range Status   Specimen Description BLOOD RIGHT ANTECUBITAL  Final   Special Requests   Final    BOTTLES DRAWN AEROBIC AND ANAEROBIC Blood Culture results may not be optimal due to an excessive volume of blood received in culture bottles   Culture   Final    NO GROWTH 2 DAYS Performed at [facility], [address] [place], [place] [address]    Report Status PENDING  Incomplete    Coagulation Studies: No results for input(s): LABPROT, INR in the last 72 hours.  Urinalysis: No results for input(s): COLORURINE, LABSPEC, PHURINE, GLUCOSEU, HGBUR, BILIRUBINUR, KETONESUR, PROTEINUR, UROBILINOGEN, NITRITE, LEUKOCYTESUR in the last 72 hours.  Invalid input(s): APPERANCEUR    Imaging: DG Chest 1 View  Result Date: 05/22/2020 CLINICAL DATA:  Short of breath EXAM: CHEST  1 VIEW COMPARISON:  05/20/2020 FINDINGS: Improved aeration in the lung bases. Lungs are now clear. No infiltrate effusion or edema. CABG changes noted. IMPRESSION: Interval clearing of bibasilar atelectasis.  No acute abnormality Electronically Signed   By:  Franchot Gallo M.D.   On: 05/22/2020 07:53     Medications:   . sodium chloride     . allopurinol  300 mg Oral Daily  . amLODipine  10 mg Oral QHS  . aspirin EC  81 mg Oral BH-q7a  . clopidogrel  75 mg Oral Daily  . furosemide  40 mg Intravenous Once  . furosemide  60 mg Intravenous Q12H  . heparin  5,000 Units Subcutaneous Q8H  . hydrALAZINE  25 mg Oral BID  . insulin aspart  0-15 Units Subcutaneous TID WC  . insulin aspart  10 Units Subcutaneous TID WC  . insulin glargine  50 Units Subcutaneous QHS  . isosorbide mononitrate  30 mg Oral Daily  . metoprolol tartrate  50 mg Oral BID  . sodium chloride flush  3 mL Intravenous Q12H   sodium chloride, acetaminophen, ondansetron (ZOFRAN) IV, sodium chloride flush  Assessment/ Plan:  59 y.o. female with past medical history of diastolic heart failure, coronary artery disease, chronic kidney disease stage IV baseline EGFR 23, history of  CVA, diabetes mellitus type 2, hypertension, hyperlipidemia, legal blindness, history of myocardial infarction, obesity who was admitted with increasing shortness of breath.  1.  Acute kidney injury/chronic kidney disease stage IV baseline EGFR 23.  eGFR currently is quite low at 13.  A month ago her EGFR was 23.  Diastolic heart failure may be playing a role in her decreased renal function now.  Continue to cautiously use Lasix monitoring renal function closely.  Alternatively this could potentially represent progression of underlying kidney disease.   -EGFR remains quite low at 12 with use of diuretics.  Previously when diuretics were held she went into pulmonary edema.  Patient may indeed have ESRD now.  No immediate need for dialysis but may need to consider this if azotemia continues to worsen.  This was explained in depth with the patient.  2.  Anemia of chronic kidney disease.  Hemoglobin currently 8.1.  Iron saturation 18%.  Administer Feraheme today.  3.  Acute on chronic diastolic heart failure.   Appears improved with Lasix.  Continue diuresis as before..   LOS: 2 Sharon Arias 6/3/20212:25 PM

## 2020-05-22 NOTE — Progress Notes (Signed)
PROGRESS NOTE    [name]  AVW:[id number] DOB: 09/20/1961 DOA: 05/20/2020 PCP: Idelle Crouch, MD   Brief Narrative:  HPI per Dr. Collier Bullock on 05/20/20  Sharon Arias is a 59 y.o. female with medical history significant for stage IV chronic kidney disease, chronic diastolic dysfunction CHF, coronary artery disease, morbid obesity, diabetes mellitus and hypertension who presents to the emergency room for evaluation of shortness of breath.  Patient was recently discharged from the hospital about a week ago.  Patient states that her symptoms started acutely 1 day prior to her admission and she feels like she is unable to catch her breath.  Symptoms have been constant without relief and is associated with orthopnea and lower extremity swelling.  Patient states that she has Lasix prescribed which she takes as needed for swelling and her last dose was 2 days ago. Patient denies having any fever, chills, cough, headache, nausea, vomiting and changes in her bowel habits.  She denies having any chest pain, diaphoresis or palpitations. Patient's pulse ox on room air in the field was 92% and she was placed on 2 L of oxygen by EMS. Chest x-ray shows focal opacity within the peripheral aspect of the left lung base,suspicious for pneumonia. Mild pulmonary vascular congestion and bilateral perihilar interstitial prominence suggesting mild edema. CT scan of the chest showed bibasilar atelectasis and effusions. Bibasilar infiltrates are less favored. No adenopathy. Twelve-lead EKG shows sinus rhythm with LVH Labs reveal elevated troponin levels down from her last hospitalization.  She has a white count of 15,000 and hemoglobin of 8.5 consistent with her baseline.   ED Course: Patient seen in the emergency room for evaluation of shortness of breath, orthopnea and lower extremity swelling.  She was placed on 2 L of oxygen in the field with pulse oximetry of 92% on room air.  Chest x-ray was suggestive  of CHF and possible pneumonia patient received vancomycin and cefepime in the emergency room.  She will be admitted to the hospital for further evaluation.  **Interim History  Respiratory status is improved however renal function continues to worsen. Cardiology recommending continue diuretics and will reassess her volume status and renal function in a.m.  Nephrology consulted for further evaluation and recommendations may recommend cautious use of Lasix with close renal function monitoring.  They are checking ultrasound to make sure that there is no obstruction but she is urinating quite well.  We will change her IV diuresis to 40 mg twice daily after I discussed the case with nephrology.  Assessment & Plan:   Principal Problem:   Acute on chronic diastolic CHF (congestive heart failure) (HCC) Active Problems:   Anemia in chronic kidney disease   CKD (chronic kidney disease), stage IV (HCC)   Coronary artery disease   Type 2 diabetes mellitus with renal complication (HCC)   Morbid obesity with BMI of 45.0-49.9, adult (HCC)  Acute on Chronic Diastolic Dysfunction CHF -Patient presents for evaluation of shortness of breath associated with orthopnea lower extremity swelling in the setting of not taking her Diuretics -On admission her BNP was 726.3 and CT of the chest wo Contrast was done and showed "Bibasilar atelectasis and effusions. Bibasilar infiltrates are less favored.  No adenopathy." -CXR done and showed "Focal opacity within the peripheral aspect of the left lung base, suspicious for pneumonia.  Mild pulmonary vascular congestion and bilateral perihilar interstitial prominence suggesting mild edema." -She received antibiotics in the ED but will not continue as likely the findings  on her chest x-ray and CT scan are in the setting of volume overload -PCT was 0.14 and doubt PNA  -Leukocytosis is likely reactive at 15,000 and is now trending down to 11,900 -Last known LVEF is 55 -  60% -Started patient on Lasix 60 mg IV every 12h but have reduced her own to 40 every 12 and nephrology recommendation after I discussed the case with Dr. Anthonette Legato -C/w Maintain low-sodium diet -Optimize blood pressure control -Continue nitrates, hydralazine and metoprolol -Strict I's and O's and Daily Weights; she is - 5.19 L since admission and weight is relatively the same but was down from 5 pounds yesterday and question the accuracy -Check chest x-ray and showed "Interval clearing of bibasilar atelectasis.  No acute abnormality" -Check ambulatory home O2 screen prior to discharge -Continue to monitor for signs and symptoms of volume overload and her respiratory status is improving but she continues to have some lower extremity edema so we will continue diuresis but a reduced dose  Hypertension  -Blood pressure is improved -Continued with isosorbide mononitrate 30 mg p.o. daily, hydralazine 75 mg p.o. 3 times daily, metoprolol tartrate 50 mg p.o. twice daily however this may be too much for her so we held her isosorbide mononitrate and reduce her hydralazine dose to 25 mg p.o. 3 times daily -With IV diuresis as above  Diabetes Mellitus with complications of stage IV chronic kidney disease -Patient with CHF and metabolic acidosis but without evidence of hyperkalemia. -If renal function does not improve by the a.m. we will consult nephrology then -Patient will need renal replacement therapy in the near future -Maintain consistent carbohydrate diet -Glycemic control with sliding scale insulin -Continue long-acting insulin -CBGs ranging from 217-344  Acute on Chronic Kidney Disease Stage IV, slightly worsening Metabolic Acidosis, improved Hyperphosphatemia -Patient's BUN/creatinine went from 58/3.28 -> 66/3.76 -> 81/4.01 and worsened in the setting of diuresis -Continues to remain volume overloaded so we will continue with IV diuresis as above  -May need nephrology consultation  if renal function continues to worsen -Patient CO2 is now 22, chloride level is 101, and anion gap is 11 -Phos Level is now 4.9 -Her respiratory status is much improved though -Avoid further nephrotoxic medications, contrast dyes, hypotension and renally adjust medications -Continue with blood pressure control as above -Repeat CMP in the AM  Anemia of Chronic Kidney Disease -Patient's Hgb/Hct went from 8.5/25.4 -> 7.8/23.8 -> 8.1/24.5 -Check Anemia Panel and showed an iron level of 36, U IBC 164, TIBC of 200, saturation ratios of 18%, ferritin level of 253, folate of 9.8, vitamin B12 of 221 -Continue to Monitor for S/Sx of Bleeding; Currently no overt bleeding noted -Repeat CBC in AM   Elevated Troponin -Most likely secondary to demand ischemia from acute CHF exacerbation allergy does not believe that she has true ACS -Patient has no EKG changes -Continue aspirin and beta-blockers -Cardiology consulted for further evaluation and they feel that the patient has acute demand supply ischemia and they are recommending continuing current therapy as above and continue diuresis as well as deferring cardiac catheterization in light of the patient's renal function status and to further cardiac diagnostics.  They are recommending increasing activity and ambulating today  History of Coronary Artery Disease -Continue Aspirin 81 mg, Clopidogrel 75 mg po Daily, Metoprolol 50 mg po BID -Changed Hydralazine 75 mg po TID to 25 mg po TID, Isosorbide Mononitrate 30 mg po Daily  -Cardiology consulted and they are recommending continue clopidogrel and deferring cardiac catheterization  in light of the patient's renal status  Leukocytosis -Likely reactive in the setting of her respiratory status and CHF -WBC went from 15.0 is now 11.9 -Unlikely has an infection so antibiotics were stopped -Continue monitor and trend repeat CBC in a.m.  Obesity -Estimated body mass index is 38.33 kg/m as calculated from  the following:   Height as of this encounter: 5\' 7"  (1.702 m).   Weight as of this encounter: 111 kg. -Weight Loss and Dietary Counseling given   DVT prophylaxis: Heparin 5,000 sq q8h Code Status: FULL CODE  Family Communication: No family present at bedside  Disposition Plan: Remain inpatient for continued diuresis and further improvement of her respiratory status prior to discharging and will need PT OT to further evaluate and treat  Status is: Inpatient  Remains inpatient appropriate because:Unsafe d/c plan, IV treatments appropriate due to intensity of illness or inability to take PO and Inpatient level of care appropriate due to severity of illness   Dispo: The patient is from: Home              Anticipated d/c is to: Home              Anticipated d/c date is: 1-2 days              Patient currently is not medically stable to d/c.  Consultants:   Cardiology    Procedures: None   Antimicrobials:  Anti-infectives (From admission, onward)   Start     Dose/Rate Route Frequency Ordered Stop   05/20/20 1500  vancomycin (VANCOREADY) IVPB 1500 mg/300 mL     1,500 mg 150 mL/hr over 120 Minutes Intravenous  Once 05/20/20 1230 05/20/20 1914   05/20/20 1230  vancomycin (VANCOCIN) IVPB 1000 mg/200 mL premix     1,000 mg 200 mL/hr over 60 Minutes Intravenous  Once 05/20/20 1222 05/20/20 1503   05/20/20 1230  ceFEPIme (MAXIPIME) 2 g in sodium chloride 0.9 % 100 mL IVPB     2 g 200 mL/hr over 30 Minutes Intravenous  Once 05/20/20 1222 05/20/20 1618     Subjective: Seen and examined at bedside and states that her respiratory status is much improved.  Was little concerned about her renal function but is agreeable to dialysis if she needs it.  No chest pain, lightheadedness or dizziness.  No nausea or vomiting.  Denies any lightheadedness or dizziness.  No other concerns or complaints at this time.  Objective: Vitals:   05/22/20 0341 05/22/20 0823 05/22/20 1211 05/22/20 1627  BP:  (!) 130/52 (!) 112/49 (!) 120/39 (!) 122/44  Pulse: 60 61 (!) 58 (!) 58  Resp: 18 18 18 18   Temp: 98.2 F (36.8 C) 98.3 F (36.8 C) 97.7 F (36.5 C)   TempSrc: Oral Oral Oral   SpO2: 97% 98% 95% 96%  Weight: 111 kg     Height:        Intake/Output Summary (Last 24 hours) at 05/22/2020 1711 Last data filed at 05/22/2020 1345 Gross per 24 hour  Intake 240 ml  Output 1800 ml  Net -1560 ml   Filed Weights   05/20/20 1903 05/21/20 0542 05/22/20 0341  Weight: 116 kg 113.8 kg 111 kg   Examination: Physical Exam:  Constitutional: WN/WD currently no acute distress appears calm and comfortable Eyes: Lids and conjunctivae normal, sclerae anicteric  ENMT: External Ears, Nose appear normal. Grossly normal hearing. Mucous membranes are moist.   Neck: Appears normal, supple, no cervical masses, normal ROM, no  appreciable thyromegaly; no JVD Respiratory: Diminished to auscultation bilaterally with unlabored breathing has some coarse breath sounds, no wheezing, rales, rhonchi or crackles. Normal respiratory effort and patient is not tachypenic. No accessory muscle use.  Cardiovascular: RRR, no murmurs / rubs / gallops. S1 and S2 auscultated.  Has minimal to 1+ lower extremity edema Abdomen: Soft, non-tender, distended secondary body habitus. Bowel sounds positive.  GU: Deferred. Musculoskeletal: No clubbing / cyanosis of digits/nails. No joint deformity upper and lower extremities.  Skin: No rashes, lesions, ulcers on limited skin evaluation. No induration; Warm and dry.  Neurologic: CN 2-12 grossly intact with no focal deficits. Romberg sign and cerebellar reflexes not assessed.  Psychiatric: Normal judgment and insight. Alert and oriented x 3. Normal mood and appropriate affect.   Data Reviewed: I have personally reviewed following labs and imaging studies  CBC: Recent Labs  Lab 05/20/20 1027 05/21/20 0448 05/22/20 0427  WBC 15.0* 13.5* 11.9*  NEUTROABS  --  10.6* 8.0*  HGB 8.5* 7.8*  8.1*  HCT 25.4* 23.8* 24.5*  MCV 87.6 88.1 87.2  PLT 253 267 546   Basic Metabolic Panel: Recent Labs  Lab 05/20/20 1027 05/21/20 0448 05/22/20 0427  NA 137 135 134*  K 3.8 3.7 3.5  CL 106 103 101  CO2 19* 20* 22  GLUCOSE 188* 324* 260*  BUN 58* 66* 81*  CREATININE 3.28* 3.76* 4.01*  CALCIUM 8.9 8.7* 8.9  MG  --  1.8 2.0  PHOS  --  3.9 4.9*   GFR: Estimated Creatinine Clearance: 19.7 mL/min (A) (by C-G formula based on SCr of 4.01 mg/dL (H)). Liver Function Tests: Recent Labs  Lab 05/22/20 0427  AST 25  ALT 34  ALKPHOS 61  BILITOT 0.6  PROT 7.2  ALBUMIN 3.2*   No results for input(s): LIPASE, AMYLASE in the last 168 hours. No results for input(s): AMMONIA in the last 168 hours. Coagulation Profile: No results for input(s): INR, PROTIME in the last 168 hours. Cardiac Enzymes: No results for input(s): CKTOTAL, CKMB, CKMBINDEX, TROPONINI in the last 168 hours. BNP (last 3 results) No results for input(s): PROBNP in the last 8760 hours. HbA1C: No results for input(s): HGBA1C in the last 72 hours. CBG: Recent Labs  Lab 05/21/20 1707 05/21/20 2110 05/22/20 0823 05/22/20 1141 05/22/20 1633  GLUCAP 219* 278* 217* 344* 220*   Lipid Profile: No results for input(s): CHOL, HDL, LDLCALC, TRIG, CHOLHDL, LDLDIRECT in the last 72 hours. Thyroid Function Tests: No results for input(s): TSH, T4TOTAL, FREET4, T3FREE, THYROIDAB in the last 72 hours. Anemia Panel: Recent Labs    05/22/20 0427  VITAMINB12 221  FOLATE 9.8  FERRITIN 253  TIBC 200*  IRON 36  RETICCTPCT 2.5   Sepsis Labs: Recent Labs  Lab 05/20/20 1237  PROCALCITON 0.14    Recent Results (from the past 240 hour(s))  SARS Coronavirus 2 by RT PCR (hospital order, performed in [facility] hospital lab) Nasopharyngeal Nasopharyngeal Swab     Status: None   Collection Time: 05/12/20  5:41 PM   Specimen: Nasopharyngeal Swab  Result Value Ref Range Status   SARS Coronavirus 2 NEGATIVE NEGATIVE Final     Comment: (NOTE) SARS-CoV-2 target nucleic acids are NOT DETECTED. The SARS-CoV-2 RNA is generally detectable in upper and lower respiratory specimens during the acute phase of infection. The lowest concentration of SARS-CoV-2 viral copies this assay can detect is 250 copies / mL. A negative result does not preclude SARS-CoV-2 infection and should not be used as  the sole basis for treatment or other patient management decisions.  A negative result may occur with improper specimen collection / handling, submission of specimen other than nasopharyngeal swab, presence of viral mutation(s) within the areas targeted by this assay, and inadequate number of viral copies (<250 copies / mL). A negative result must be combined with clinical observations, patient history, and epidemiological information. Fact Sheet for Patients:   StrictlyIdeas.no Fact Sheet for Healthcare Providers: BankingDealers.co.za This test is not yet approved or cleared  by the [place] FDA and has been authorized for detection and/or diagnosis of SARS-CoV-2 by FDA under an Emergency Use Authorization (EUA).  This EUA will remain in effect (meaning this test can be used) for the duration of the COVID-19 declaration under Section 564(b)(1) of the Act, 21 U.S.C. section 360bbb-3(b)(1), unless the authorization is terminated or revoked sooner. Performed at [facility], [address] [place], [place] [address]   SARS Coronavirus 2 by RT PCR (hospital order, performed in [facility] hospital lab) Nasopharyngeal Nasopharyngeal Swab     Status: None   Collection Time: 05/20/20 12:37 PM   Specimen: Nasopharyngeal Swab  Result Value Ref Range Status   SARS Coronavirus 2 NEGATIVE NEGATIVE Final    Comment: (NOTE) SARS-CoV-2 target nucleic acids are NOT DETECTED. The SARS-CoV-2 RNA is generally detectable in upper and lower respiratory specimens during the acute  phase of infection. The lowest concentration of SARS-CoV-2 viral copies this assay can detect is 250 copies / mL. A negative result does not preclude SARS-CoV-2 infection and should not be used as the sole basis for treatment or other patient management decisions.  A negative result may occur with improper specimen collection / handling, submission of specimen other than nasopharyngeal swab, presence of viral mutation(s) within the areas targeted by this assay, and inadequate number of viral copies (<250 copies / mL). A negative result must be combined with clinical observations, patient history, and epidemiological information. Fact Sheet for Patients:   StrictlyIdeas.no Fact Sheet for Healthcare Providers: BankingDealers.co.za This test is not yet approved or cleared  by the [place] FDA and has been authorized for detection and/or diagnosis of SARS-CoV-2 by FDA under an Emergency Use Authorization (EUA).  This EUA will remain in effect (meaning this test can be used) for the duration of the COVID-19 declaration under Section 564(b)(1) of the Act, 21 U.S.C. section 360bbb-3(b)(1), unless the authorization is terminated or revoked sooner. Performed at [facility], [address] [place], [place] [address]   Culture, blood (routine x 2)     Status: None (Preliminary result)   Collection Time: 05/20/20 12:38 PM   Specimen: BLOOD  Result Value Ref Range Status   Specimen Description BLOOD BLOOD LEFT ARM  Final   Special Requests   Final    BOTTLES DRAWN AEROBIC AND ANAEROBIC Blood Culture adequate volume   Culture   Final    NO GROWTH 2 DAYS Performed at [facility], [address] [place], [place] [address]    Report Status PENDING  Incomplete  Culture, blood (routine x 2)     Status: None (Preliminary result)   Collection Time: 05/20/20 12:38 PM   Specimen: BLOOD  Result Value Ref Range Status    Specimen Description BLOOD RIGHT ANTECUBITAL  Final   Special Requests   Final    BOTTLES DRAWN AEROBIC AND ANAEROBIC Blood Culture results may not be optimal due to an excessive volume of blood received in culture bottles  Culture   Final    NO GROWTH 2 DAYS Performed at [facility], [place], [place] [address]    Report Status PENDING  Incomplete     RN Pressure Injury Documentation:     Estimated body mass index is 38.33 kg/m as calculated from the following:   Height as of this encounter: 5\' 7"  (1.702 m).   Weight as of this encounter: 111 kg.  Malnutrition Type:      Malnutrition Characteristics:      Nutrition Interventions:    Radiology Studies: DG Chest 1 View  Result Date: 05/22/2020 CLINICAL DATA:  Short of breath EXAM: CHEST  1 VIEW COMPARISON:  05/20/2020 FINDINGS: Improved aeration in the lung bases. Lungs are now clear. No infiltrate effusion or edema. CABG changes noted. IMPRESSION: Interval clearing of bibasilar atelectasis.  No acute abnormality Electronically Signed   By: Franchot Gallo M.D.   On: 05/22/2020 07:53   US RENAL  Result Date: 05/22/2020 CLINICAL DATA:  Acute renal failure EXAM: RENAL / URINARY TRACT ULTRASOUND COMPLETE COMPARISON:  None. FINDINGS: Right Kidney: Renal measurements: 12.6 x 6.6 x 6.9 cm = volume: 302 mL . Echogenicity within normal limits. No mass or hydronephrosis visualized. Left Kidney: Renal measurements: 14.1 x 6.1 x 5.7 cm = volume: 245 mL. Echogenicity within normal limits. No mass or hydronephrosis visualized. Bladder: Appears normal for degree of bladder distention. Other: None. IMPRESSION: No acute findings.  No hydronephrosis. Electronically Signed   By: Rolm Baptise M.D.   On: 05/22/2020 17:01   Scheduled Meds: . allopurinol  300 mg Oral Daily  . amLODipine  10 mg Oral QHS  . aspirin EC  81 mg Oral BH-q7a  . clopidogrel  75 mg Oral Daily  . furosemide  40 mg Intravenous Once  . furosemide   60 mg Intravenous Q12H  . heparin  5,000 Units Subcutaneous Q8H  . hydrALAZINE  25 mg Oral BID  . insulin aspart  0-15 Units Subcutaneous TID WC  . insulin aspart  10 Units Subcutaneous TID WC  . insulin glargine  50 Units Subcutaneous QHS  . isosorbide mononitrate  30 mg Oral Daily  . metoprolol tartrate  50 mg Oral BID  . sodium chloride flush  3 mL Intravenous Q12H   Continuous Infusions: . sodium chloride      LOS: 2 days   Kerney Elbe, DO [facility] PAGER is on AMION  If 7PM-7AM, please contact night-coverage [email or web address]

## 2020-05-23 ENCOUNTER — Inpatient Hospital Stay: Payer: Medicare Other

## 2020-05-23 LAB — MAGNESIUM: Magnesium: 2.2 mg/dL (ref 1.7–2.4)

## 2020-05-23 LAB — COMPREHENSIVE METABOLIC PANEL
ALT: 34 U/L (ref 0–44)
AST: 20 U/L (ref 15–41)
Albumin: 3.5 g/dL (ref 3.5–5.0)
Alkaline Phosphatase: 73 U/L (ref 38–126)
Anion gap: 14 (ref 5–15)
BUN: 98 mg/dL — ABNORMAL HIGH (ref 6–20)
CO2: 23 mmol/L (ref 22–32)
Calcium: 9.2 mg/dL (ref 8.9–10.3)
Chloride: 99 mmol/L (ref 98–111)
Creatinine, Ser: 3.89 mg/dL — ABNORMAL HIGH (ref 0.44–1.00)
GFR calc Af Amer: 14 mL/min — ABNORMAL LOW (ref >60)
GFR calc non Af Amer: 12 mL/min — ABNORMAL LOW (ref >60)
Glucose, Bld: 235 mg/dL — ABNORMAL HIGH (ref 70–99)
Potassium: 3.6 mmol/L (ref 3.5–5.1)
Sodium: 136 mmol/L (ref 135–145)
Total Bilirubin: 0.8 mg/dL (ref 0.3–1.2)
Total Protein: 7.7 g/dL (ref 6.5–8.1)

## 2020-05-23 LAB — CBC WITH DIFFERENTIAL/PLATELET
Abs Immature Granulocytes: 0.09 10*3/uL — ABNORMAL HIGH (ref 0.00–0.07)
Basophils Absolute: 0.1 10*3/uL (ref 0.0–0.1)
Basophils Relative: 1 %
Eosinophils Absolute: 0.6 10*3/uL — ABNORMAL HIGH (ref 0.0–0.5)
Eosinophils Relative: 4 %
HCT: 26.1 % — ABNORMAL LOW (ref 36.0–46.0)
Hemoglobin: 8.8 g/dL — ABNORMAL LOW (ref 12.0–15.0)
Immature Granulocytes: 1 %
Lymphocytes Relative: 19 %
Lymphs Abs: 2.7 10*3/uL (ref 0.7–4.0)
MCH: 28.4 pg (ref 26.0–34.0)
MCHC: 33.7 g/dL (ref 30.0–36.0)
MCV: 84.2 fL (ref 80.0–100.0)
Monocytes Absolute: 1.1 10*3/uL — ABNORMAL HIGH (ref 0.1–1.0)
Monocytes Relative: 8 %
Neutro Abs: 9.3 10*3/uL — ABNORMAL HIGH (ref 1.7–7.7)
Neutrophils Relative %: 67 %
Platelets: 345 10*3/uL (ref 150–400)
RBC: 3.1 MIL/uL — ABNORMAL LOW (ref 3.87–5.11)
RDW: 14.7 % (ref 11.5–15.5)
WBC: 13.8 10*3/uL — ABNORMAL HIGH (ref 4.0–10.5)
nRBC: 0 % (ref 0.0–0.2)

## 2020-05-23 LAB — GLUCOSE, CAPILLARY
Glucose-Capillary: 215 mg/dL — ABNORMAL HIGH (ref 70–99)
Glucose-Capillary: 279 mg/dL — ABNORMAL HIGH (ref 70–99)
Glucose-Capillary: 90 mg/dL (ref 70–99)
Glucose-Capillary: 247 mg/dL — ABNORMAL HIGH (ref 70–99)

## 2020-05-23 LAB — PHOSPHORUS: Phosphorus: 6 mg/dL — ABNORMAL HIGH (ref 2.5–4.6)

## 2020-05-23 MED ORDER — ALLOPURINOL 100 MG PO TABS
100.0000 mg | ORAL_TABLET | Freq: Every day | ORAL | Status: DC
  Administered 2020-05-24: 100 mg via ORAL
  Filled 2020-05-23: qty 1

## 2020-05-23 MED ORDER — INSULIN ASPART 100 UNIT/ML ~~LOC~~ SOLN
12.0000 [IU] | Freq: Three times a day (TID) | SUBCUTANEOUS | Status: DC
  Administered 2020-05-23 – 2020-05-24 (×4): 12 [IU] via SUBCUTANEOUS
  Filled 2020-05-23 (×4): qty 1

## 2020-05-23 MED ORDER — INSULIN GLARGINE 100 UNIT/ML ~~LOC~~ SOLN
57.0000 [IU] | Freq: Every day | SUBCUTANEOUS | Status: DC
  Administered 2020-05-23: 57 [IU] via SUBCUTANEOUS
  Filled 2020-05-23 (×2): qty 0.57

## 2020-05-23 NOTE — Evaluation (Signed)
Physical Therapy Evaluation Patient Details Name: Sharon Arias MRN: [id number] DOB: Apr 15, 1961 Today's Date: 05/23/2020   History of Present Illness  Pt is a 59 y.o. female presenting to hospital 6/1 with SOB; recent admission 5/24-5/27 for UTI, AKI, and non-traumatic rhabdo.  Pt now admitted with acute on chronic diastolic dysfunction CHF, htn, and acute on chronic kidney disease stage IV.  PMH includes CKD, diastolic HF, CAD, anemia of chronic disease, DM, htn, obesity, CVA, MI, legally blind.  Clinical Impression  Prior to hospital admission, pt was modified independent; modifies activities as needed; legally blind (can see shapes and shadows); lives with her husband.  Currently pt is independent with transfers and ambulation within room to bathroom and back.  Pt declined further ambulation d/t not sleeping well last night (pt reports if she is tired then it effects her stability d/t impaired vision).  Initially pt reporting being SOB when coming to hospital but this has significantly improved.  Pt's HR 59 bpm at rest and increased to 71 bpm with activity; O2 93% post ambulation within room (on room air).  Pt reports no questions or concerns with her functional mobility at this time (pt reports she has a good sense of her room set-up--has been walking to/from bathroom on her own-- and will call for assist if she wants to walk in the hallway).  Pt appears to have appropriate insight into her condition and asks for help as needed.  No acute PT needs identified (discussed with pt and pt in agreement): will sign off.  Please re-consult PT if pt's status changes and acute PT needs are identified.    Follow Up Recommendations No PT follow up    Equipment Recommendations  None recommended by PT    Recommendations for Other Services       Precautions / Restrictions Restrictions Weight Bearing Restrictions: No      Mobility  Bed Mobility               General bed mobility comments:  Deferred (pt up in recliner beginning/end of session)  Transfers Overall transfer level: Independent Equipment used: None             General transfer comment: steady safe transfers noted  Ambulation/Gait Ambulation/Gait assistance: Independent Gait Distance (Feet): (25 feet x2 (to bathroom and back)) Assistive device: None   Gait velocity: mildly decreased   General Gait Details: mildly wider BOS; steady  Stairs            Wheelchair Mobility    Modified Rankin (Stroke Patients Only)       Balance Overall balance assessment: Needs assistance Sitting-balance support: Feet supported;No upper extremity supported Sitting balance-Leahy Scale: Normal Sitting balance - Comments: steady sitting reaching outside BOS   Standing balance support: No upper extremity supported;During functional activity Standing balance-Leahy Scale: Normal Standing balance comment: steady with ambulation and dynamic standing activities                             Pertinent Vitals/Pain Pain Assessment: No/denies pain    Home Living Family/patient expects to be discharged to:: Private residence Living Arrangements: Spouse/significant other Available Help at Discharge: Family Type of Home: House Home Access: Stairs to enter Entrance Stairs-Rails: Right Entrance Stairs-Number of Steps: 1 Home Layout: One level Home Equipment: Cane - single point;Shower seat;Grab bars - tub/shower;Walker - 2 wheels;Bedside commode;Wheelchair - manual      Prior Function  Comments: Per OT eval "Pt is independent in all ADLs, including cooking, dressing, bathing, toileting, and medication management.  Pt occasionally uses wheelchair for mobility when having a gout flareup.  Pt is legally blind, but is able to compensate and navigate household well.  Pt relies on her husband for driving.  Pt has good awareness of chronic health condition management, with daily weights and checking  blood pressure 2x/day.  Pt pays someone to clean her home."     Hand Dominance        Extremity/Trunk Assessment   Upper Extremity Assessment Upper Extremity Assessment: Overall WFL for tasks assessed    Lower Extremity Assessment Lower Extremity Assessment: Overall WFL for tasks assessed    Cervical / Trunk Assessment Cervical / Trunk Assessment: Normal  Communication   Communication: No difficulties  Cognition Arousal/Alertness: Awake/alert Behavior During Therapy: WFL for tasks assessed/performed Overall Cognitive Status: Within Functional Limits for tasks assessed                                        General Comments   Pt agreeable to PT session.    Exercises     Assessment/Plan    PT Assessment Patent does not need any further PT services  PT Problem List         PT Treatment Interventions      PT Goals (Current goals can be found in the Care Plan section)  Acute Rehab PT Goals Patient Stated Goal: to go home PT Goal Formulation: With patient Time For Goal Achievement: 06/06/20 Potential to Achieve Goals: Good    Frequency     Barriers to discharge        Co-evaluation               AM-PAC PT "6 Clicks" Mobility  Outcome Measure Help needed turning from your back to your side while in a flat bed without using bedrails?: None Help needed moving from lying on your back to sitting on the side of a flat bed without using bedrails?: None Help needed moving to and from a bed to a chair (including a wheelchair)?: None Help needed standing up from a chair using your arms (e.g., wheelchair or bedside chair)?: None Help needed to walk in hospital room?: None Help needed climbing 3-5 steps with a railing? : None 6 Click Score: 24    End of Session   Activity Tolerance: Patient tolerated treatment well Patient left: in chair;with call bell/phone within reach   PT Visit Diagnosis: Other abnormalities of gait and mobility  (R26.89)    Time: 1130-1149 PT Time Calculation (min) (ACUTE ONLY): 19 min   Charges:   PT Evaluation $PT Eval Low Complexity: 1 Low         Leeana Creer, PT 05/23/20, 12:02 PM

## 2020-05-23 NOTE — Progress Notes (Signed)
SATURATION QUALIFICATIONS: (This note is used to comply with regulatory documentation for home oxygen)  Patient Saturations on Room Air at Rest = 98%  Patient Saturations on Room Air while Ambulating = 93% Patient Saturations on 0 Liters of oxygen while Ambulating = n/a%  Please briefly explain why patient needs home oxygen:

## 2020-05-23 NOTE — Care Management Important Message (Signed)
Important Message  Patient Details  Name: Sharon Arias MRN: [id number] Date of Birth: 12/16/1961   Medicare Important Message Given:  Yes     Dannette Barbara 05/23/2020, 2:46 PM

## 2020-05-23 NOTE — Progress Notes (Signed)
Inpatient Diabetes Program Recommendations  AACE/ADA: New Consensus Statement on Inpatient Glycemic Control (2015)  Target Ranges:  Prepandial:   less than 140 mg/dL      Peak postprandial:   less than 180 mg/dL (1-2 hours)      Critically ill patients:  140 - 180 mg/dL   Lab Results  Component Value Date   GLUCAP 215 (H) 05/23/2020   HGBA1C 8.5 (H) 05/12/2020    Review of Glycemic Control Results for Sharon Arias, Sharon Arias (MRN [id number]) as of 05/23/2020 09:58  Ref. Range 05/22/2020 08:23 05/22/2020 11:41 05/22/2020 16:33 05/22/2020 21:04 05/23/2020 08:03  Glucose-Capillary Latest Ref Range: 70 - 99 mg/dL 217 (H) 344 (H) 220 (H) 211 (H) 215 (H)   Diabetes history: DM2 Outpatient Diabetes medications: Lantus 80 units qd + Humalog 15 units am + 25 units afternoon + 30 units hs Current orders for Inpatient glycemic control: Lantus 50 units qd + Novolog 10 units tid meal coverage + Novolog correction moderate scale tid  Inpatient Diabetes Program Recommendations:   -Increase Lantus to 55-60 units qd -Increase Novolog meal coverage to 12 units tid if eats 50%  Thank you, Bethena Roys E. Dwyne Hasegawa, RN, MSN, CDE  Diabetes Coordinator Inpatient Glycemic Control Team Team Pager [phone] (8am-5pm) 05/23/2020 9:59 AM

## 2020-05-23 NOTE — Progress Notes (Addendum)
PROGRESS NOTE    Sharon Arias  ASN:[id number] DOB: [birth year] DOA: 05/20/2020 PCP: Sharon Crouch, MD   Brief Narrative:  HPI per Dr. Collier Arias on 05/20/20  Sharon Arias Sharon Arias is a 59 y.o. female with medical history significant for stage IV chronic kidney disease, chronic diastolic dysfunction CHF, coronary artery disease, morbid obesity, diabetes mellitus and hypertension who presents to the emergency room for evaluation of shortness of breath.  Patient was recently discharged from the hospital about a week ago.  Patient states that her symptoms started acutely 1 day prior to her admission and she feels like she is unable to catch her breath.  Symptoms have been constant without relief and is associated with orthopnea and lower extremity swelling.  Patient states that she has Lasix prescribed which she takes as needed for swelling and her last dose was 2 days ago. Patient denies having any fever, chills, cough, headache, nausea, vomiting and changes in her bowel habits.  She denies having any chest pain, diaphoresis or palpitations. Patient's pulse ox on room air in the field was 92% and she was placed on 2 L of oxygen by EMS. Chest x-ray shows focal opacity within the peripheral aspect of the left lung base,suspicious for pneumonia. Mild pulmonary vascular congestion and bilateral perihilar interstitial prominence suggesting mild edema. CT scan of the chest showed bibasilar atelectasis and effusions. Bibasilar infiltrates are less favored. No adenopathy. Twelve-lead EKG shows sinus rhythm with LVH Labs reveal elevated troponin levels down from her last hospitalization.  She has a white count of 15,000 and hemoglobin of 8.5 consistent with her baseline.   ED Course: Patient seen in the emergency room for evaluation of shortness of breath, orthopnea and lower extremity swelling.  She was placed on 2 L of oxygen in the field with pulse oximetry of 92% on room air.  Chest x-ray was suggestive  of CHF and possible pneumonia patient received vancomycin and cefepime in the emergency room.  She will be admitted to the hospital for further evaluation.  **Interim History  Respiratory status is improved however renal function continued to worsen but is slightly improved today. Cardiology recommended continuing diuretics and will again reassess her volume status and renal function in a.m.  Nephrology consulted for further evaluation and recommendations and recommended cautious use of Lasix with close renal function monitoring.  They are checking ultrasound to make sure that there is no obstruction but she is urinating quite well.  We will change her IV diuresis to 40 mg twice daily after I discussed the case with nephrology.  Patient feels that her edema is improving however EGFR still remains low and creatinine slightly improved nephrology is in continuing discussion with the patient about renal replacement therapy and suspect that if diuretics were held she redeveloped pulmonary edema shortness of breath and if there continue that likely impact her renal function further.  I recommend continuing the diuretics today at 40 mg IV every 12 and Dr. Juleen Arias will follow up with the patient in the a.m for further discussion about renal placement therapy  Assessment & Plan:   Principal Problem:   Acute on chronic diastolic CHF (congestive heart failure) ([place]) Active Problems:   Anemia in chronic kidney disease   CKD (chronic kidney disease), stage IV ([place])   Coronary artery disease   Type 2 diabetes mellitus with renal complication ([place])   Morbid obesity with BMI of 45.0-49.9, adult ([place])  Acute on Chronic Diastolic Dysfunction CHF -Patient presents for evaluation  of shortness of breath associated with orthopnea lower extremity swelling in the setting of not taking her Diuretics -On admission her BNP was 726.3 and CT of the chest wo Contrast was done and showed "Bibasilar atelectasis and effusions.  Bibasilar infiltrates are less favored.  No adenopathy." -CXR done and showed "Focal opacity within the peripheral aspect of the left lung base, suspicious for pneumonia.  Mild pulmonary vascular congestion and bilateral perihilar interstitial prominence suggesting mild edema." -She received antibiotics in the ED but will not continue as likely the findings on her chest x-ray and CT scan are in the setting of volume overload -PCT was 0.14 and doubt PNA  -Leukocytosis is likely reactive at 15,000 and was trending down to 11,900 but is now bumped to 13.8 -Last known LVEF is 55 - 60% -Started patient on Lasix 60 mg IV every 12h but have reduced her to IV 40 mg every 12 after I discussed the case with Dr. Anthonette Arias and will continue today -C/w Maintain low-sodium diet -Optimize blood pressure control -Continue nitrates, hydralazine and metoprolol -Strict I's and O's and Daily Weights; she is - 6.432 L since admission and weight is relatively the same but was down from 11 lbs from highest documented weight but ? Accuracy -Check chest x-ray and showed "Interval clearing of bibasilar atelectasis.  No acute abnormality" -Check ambulatory home O2 screen prior to discharge; currently off of O2 -Continue to monitor for signs and symptoms of volume overload and her respiratory status is improving but she continues to have some lower extremity edema so we will continue diuresis but a reduced dose -Repeat chest x-ray showed "Stable exam.  No evidence of active disease."  Hypertension  -Blood pressure is improved -Continued with isosorbide mononitrate 30 mg p.o. daily, hydralazine 75 mg p.o. 3 times daily, metoprolol tartrate 50 mg p.o. twice daily however this may be too much for her so we held her isosorbide mononitrate and reduce her hydralazine dose to 25 mg p.o. 3 times daily -C/w With IV diuresis as above -BP Is 137/56  Diabetes Mellitus with complications of stage IV chronic kidney  disease -Patient with CHF and metabolic acidosis but without evidence of hyperkalemia. -Patient will need renal replacement therapy in the near future -Maintain consistent carbohydrate diet -Glycemic control with sliding scale insulin with Moderat Novolog SSI AC -Have also added Insulin Aspart 12 units TID wm  -Continue long-acting insulin and have increased to 57 units sq Daily -CBGs ranging from 211-279 -Continue to Monitor Blood Sugars closely and adjust insulin as necessary  Acute on Chronic Kidney Disease Stage IV, slightly worsening Metabolic Acidosis, improved Hyperphosphatemia -Patient's BUN/creatinine went from 58/3.28 -> 66/3.76 -> 81/4.01 and worsened in the setting of diuresis and today is 98/3.89 -Continues to remain volume overloaded so we will continue with IV diuresis as above  -May need nephrology consultation if renal function continues to worsen -Patient CO2 is now 23, chloride level is 99, and anion gap is 14 -Phos Level has gone from 4.9 -> 6.0 -Her respiratory status is much improved though -Avoid further nephrotoxic medications, contrast dyes, hypotension and renally adjust medications -Continue with blood pressure control as above -Repeat CMP in the AM  Anemia of Chronic Kidney Disease -Patient's Hgb/Hct went from 8.5/25.4 -> 7.8/23.8 -> 8.1/24.5 -> 8.8/26.1 -Check Anemia Panel and showed an iron level of 36, U IBC 164, TIBC of 200, saturation ratios of 18%, ferritin level of 253, folate of 9.8, vitamin B12 of 221 -She received a dose  of Ferumoxytol 510 mg IV yesterday per Nephrology  -Continue to Monitor for S/Sx of Bleeding; Currently no overt bleeding noted -Repeat CBC in AM   Elevated Troponin -Most likely secondary to demand ischemia from acute CHF exacerbation allergy does not believe that she has true ACS -Patient has no EKG changes -Continue aspirin and beta-blockers -Cardiology consulted for further evaluation and they feel that the patient has  acute demand supply ischemia and they are recommending continuing current therapy as above and continue diuresis as well as deferring cardiac catheterization in light of the patient's renal function status and to further cardiac diagnostics.  They are recommending increasing activity and ambulating today  History of Coronary Artery Disease -Continue Aspirin 81 mg, Clopidogrel 75 mg po Daily, Metoprolol 50 mg po BID -Changed Hydralazine 75 mg po TID to 25 mg po TID, Isosorbide Mononitrate 30 mg po Daily  -Cardiology consulted and they are recommending continue clopidogrel and deferring cardiac catheterization in light of the patient's renal status  Leukocytosis -Likely reactive in the setting of her respiratory status and CHF -WBC went from 15.0 -> 11.9 -> 13.8 -Unlikely has an infection so antibiotics were stopped -Continue monitor and trend repeat CBC in a.m.  Obesity -Estimated body mass index is 38.33 kg/m as calculated from the following:   Height as of this encounter: _0  (1.702 m).   Weight as of this encounter: 111 kg. -Weight Loss and Dietary Counseling given   DVT prophylaxis: Heparin 5,000 sq q8h Code Status: FULL CODE  Family Communication: No family present at bedside  Disposition Plan: Remain inpatient for continued diuresis and further improvement of her respiratory status prior to discharging and will need PT OT to further evaluate and treat; Will need Nephrology and Cardiac Clearance   Status is: Inpatient  Remains inpatient appropriate because:Unsafe d/c plan, IV treatments appropriate due to intensity of illness or inability to take PO and Inpatient level of care appropriate due to severity of illness   Dispo: The patient is from: Home              Anticipated d/c is to: Home              Anticipated d/c date is: 1-2 days              Patient currently is not medically stable to d/c.  Consultants:   Cardiology   Nephrology    Procedures:  None   Antimicrobials:  Anti-infectives (From admission, onward)   Start     Dose/Rate Route Frequency Ordered Stop   05/20/20 1500  vancomycin (VANCOREADY) IVPB 1500 mg/300 mL     1,500 mg 150 mL/hr over 120 Minutes Intravenous  Once 05/20/20 1230 05/20/20 1914   05/20/20 1230  vancomycin (VANCOCIN) IVPB 1000 mg/200 mL premix     1,000 mg 200 mL/hr over 60 Minutes Intravenous  Once 05/20/20 1222 05/20/20 1503   05/20/20 1230  ceFEPIme (MAXIPIME) 2 g in sodium chloride 0.9 % 100 mL IVPB     2 g 200 mL/hr over 30 Minutes Intravenous  Once 05/20/20 1222 05/20/20 1618     Subjective: Seen and examined at bedside and states her breathing is much better and that her leg swelling is improving.  No nausea or vomiting.  Denies any lightheadedness or dizziness.  No other concerns or complaints at this time states that she has not been sleeping very well and has not slept in a few days.  Objective: Vitals:   05/23/20 0410  05/23/20 0600 05/23/20 0802 05/23/20 0947  BP: (!) 127/53  (!) 137/56   Pulse: 60  (!) 59 67  Resp: _0 Temp: 98 F (36.7 C)  97.8 F (36.6 C)   TempSrc: Oral  Oral   SpO2: 99%  98%   Weight:      Height:        Intake/Output Summary (Last 24 hours) at 05/23/2020 1157 Last data filed at 05/23/2020 0600 Gross per 24 hour  Intake 597.93 ml  Output 1600 ml  Net -1002.07 ml   Filed Weights   05/21/20 0542 05/22/20 0341 05/23/20 0350  Weight: 113.8 kg 111 kg 111 kg   Examination: Physical Exam:  Constitutional: WN/WD obese Caucasian female currently in no acute distress appears calm and comfortable sitting in chair bedside Eyes: Lids and conjunctivae normal, sclerae anicteric  ENMT: External Ears, Nose appear normal. Grossly normal hearing.  Neck: Appears normal, supple, no cervical masses, normal ROM, no appreciable thyromegaly; no JVD Respiratory: Diminished to auscultation bilaterally with coarse breath sound, no wheezing, rales, rhonchi or crackles.  Normal respiratory effort and patient is not tachypenic. No accessory muscle use.  Unlabored breathing Cardiovascular: RRR, no murmurs / rubs / gallops. S1 and S2 auscultated.  Minimal to 1+ extremity edema.  Abdomen: Soft, non-tender, distended secondary body habitus. Bowel sounds positive.  GU: Deferred. Musculoskeletal: No clubbing / cyanosis of digits/nails. No joint deformity upper and lower extremities. .  Skin: No rashes, lesions, ulcers on limited skin evaluation. No induration; Warm and dry.  Neurologic: CN 2-12 grossly intact with no focal deficits. Romberg sign and cerebellar reflexes not assessed.  Psychiatric: Normal judgment and insight. Alert and oriented x 3. Pleasant mood and appropriate affect.   Data Reviewed: I have personally reviewed following labs and imaging studies  CBC: Recent Labs  Lab 05/20/20 1027 05/21/20 0448 05/22/20 0427 05/23/20 0522  WBC 15.0* 13.5* 11.9* 13.8*  NEUTROABS  --  10.6* 8.0* 9.3*  HGB 8.5* 7.8* 8.1* 8.8*  HCT 25.4* 23.8* 24.5* 26.1*  MCV 87.6 88.1 87.2 84.2  PLT 253 267 299 132   Basic Metabolic Panel: Recent Labs  Lab 05/20/20 1027 05/21/20 0448 05/22/20 0427 05/23/20 0522  NA 137 135 134* 136  K 3.8 3.7 3.5 3.6  CL 106 103 101 99  CO2 19* 20* 22 23  GLUCOSE 188* 324* 260* 235*  BUN 58* 66* 81* 98*  CREATININE 3.28* 3.76* 4.01* 3.89*  CALCIUM 8.9 8.7* 8.9 9.2  MG  --  1.8 2.0 2.2  PHOS  --  3.9 4.9* 6.0*   GFR: Estimated Creatinine Clearance: 20.3 mL/min (A) (by C-G formula based on SCr of 3.89 mg/dL (H)). Liver Function Tests: Recent Labs  Lab 05/22/20 0427 05/23/20 0522  AST 25 20  ALT 34 34  ALKPHOS 61 73  BILITOT 0.6 0.8  PROT 7.2 7.7  ALBUMIN 3.2* 3.5   No results for input(s): LIPASE, AMYLASE in the last 168 hours. No results for input(s): AMMONIA in the last 168 hours. Coagulation Profile: No results for input(s): INR, PROTIME in the last 168 hours. Cardiac Enzymes: No results for input(s): CKTOTAL,  CKMB, CKMBINDEX, TROPONINI in the last 168 hours. BNP (last 3 results) No results for input(s): PROBNP in the last 8760 hours. HbA1C: No results for input(s): HGBA1C in the last 72 hours. CBG: Recent Labs  Lab 05/22/20 1141 05/22/20 1633 05/22/20 2104 05/23/20 0803 05/23/20 1127  GLUCAP 344* 220* 211* 215* 279*   Lipid  Profile: No results for input(s): CHOL, HDL, LDLCALC, TRIG, CHOLHDL, LDLDIRECT in the last 72 hours. Thyroid Function Tests: No results for input(s): TSH, T4TOTAL, FREET4, T3FREE, THYROIDAB in the last 72 hours. Anemia Panel: Recent Labs    05/22/20 0427  VITAMINB12 221  FOLATE 9.8  FERRITIN 253  TIBC 200*  IRON 36  RETICCTPCT 2.5   Sepsis Labs: Recent Labs  Lab 05/20/20 1237  PROCALCITON 0.14    Recent Results (from the past 240 hour(s))  SARS Coronavirus 2 by RT PCR (hospital order, performed in [facility] hospital lab) Nasopharyngeal Nasopharyngeal Swab     Status: None   Collection Time: 05/20/20 12:37 PM   Specimen: Nasopharyngeal Swab  Result Value Ref Range Status   SARS Coronavirus 2 NEGATIVE NEGATIVE Final    Comment: (NOTE) SARS-CoV-2 target nucleic acids are NOT DETECTED. The SARS-CoV-2 RNA is generally detectable in upper and lower respiratory specimens during the acute phase of infection. The lowest concentration of SARS-CoV-2 viral copies this assay can detect is 250 copies / mL. A negative result does not preclude SARS-CoV-2 infection and should not be used as the sole basis for treatment or other patient management decisions.  A negative result may occur with improper specimen collection / handling, submission of specimen other than nasopharyngeal swab, presence of viral mutation(s) within the areas targeted by this assay, and inadequate number of viral copies (<250 copies / mL). A negative result must be combined with clinical observations, patient history, and epidemiological information. Fact Sheet for Patients:    StrictlyIdeas.no Fact Sheet for Healthcare Providers: BankingDealers.co.za This test is not yet approved or cleared  by the [place] FDA and has been authorized for detection and/or diagnosis of SARS-CoV-2 by FDA under an Emergency Use Authorization (EUA).  This EUA will remain in effect (meaning this test can be used) for the duration of the COVID-19 declaration under Section 564(b)(1) of the Act, 21 U.S.C. section 360bbb-3(b)(1), unless the authorization is terminated or revoked sooner. Performed at [facility], [address] [place], [place] [address]   Culture, blood (routine x 2)     Status: None (Preliminary result)   Collection Time: 05/20/20 12:38 PM   Specimen: BLOOD  Result Value Ref Range Status   Specimen Description BLOOD BLOOD LEFT ARM  Final   Special Requests   Final    BOTTLES DRAWN AEROBIC AND ANAEROBIC Blood Culture adequate volume   Culture   Final    NO GROWTH 3 DAYS Performed at [facility], [address], [place], [place] [address]    Report Status PENDING  Incomplete  Culture, blood (routine x 2)     Status: None (Preliminary result)   Collection Time: 05/20/20 12:38 PM   Specimen: BLOOD  Result Value Ref Range Status   Specimen Description BLOOD RIGHT ANTECUBITAL  Final   Special Requests   Final    BOTTLES DRAWN AEROBIC AND ANAEROBIC Blood Culture results may not be optimal due to an excessive volume of blood received in culture bottles   Culture   Final    NO GROWTH 3 DAYS Performed at [facility], [address], [place], [place] [address]    Report Status PENDING  Incomplete     RN Pressure Injury Documentation:     Estimated body mass index is 38.33 kg/m as calculated from the following:   Height as of this encounter: _0  (1.702 m).   Weight as of this encounter: 111 kg.  Malnutrition Type:  Malnutrition Characteristics:      Nutrition  Interventions:    Radiology Studies: DG Chest 1 View  Result Date: 05/23/2020 CLINICAL DATA:  Shortness of breath EXAM: CHEST  1 VIEW COMPARISON:  05/22/2020 FINDINGS: Cardiomegaly. CABG. There is no edema, consolidation, effusion, or pneumothorax. IMPRESSION: Stable exam.  No evidence of active disease. Electronically Signed   By: Monte Fantasia M.D.   On: 05/23/2020 06:08   DG Chest 1 View  Result Date: 05/22/2020 CLINICAL DATA:  Short of breath EXAM: CHEST  1 VIEW COMPARISON:  05/20/2020 FINDINGS: Improved aeration in the lung bases. Lungs are now clear. No infiltrate effusion or edema. CABG changes noted. IMPRESSION: Interval clearing of bibasilar atelectasis.  No acute abnormality Electronically Signed   By: Franchot Gallo M.D.   On: 05/22/2020 07:53   US RENAL  Result Date: 05/22/2020 CLINICAL DATA:  Acute renal failure EXAM: RENAL / URINARY TRACT ULTRASOUND COMPLETE COMPARISON:  None. FINDINGS: Right Kidney: Renal measurements: 12.6 x 6.6 x 6.9 cm = volume: 302 mL . Echogenicity within normal limits. No mass or hydronephrosis visualized. Left Kidney: Renal measurements: 14.1 x 6.1 x 5.7 cm = volume: 245 mL. Echogenicity within normal limits. No mass or hydronephrosis visualized. Bladder: Appears normal for degree of bladder distention. Other: None. IMPRESSION: No acute findings.  No hydronephrosis. Electronically Signed   By: Rolm Baptise M.D.   On: 05/22/2020 17:01   Scheduled Meds: . [START ON 05/24/2020] allopurinol  100 mg Oral Daily  . amLODipine  10 mg Oral QHS  . aspirin EC  81 mg Oral BH-q7a  . clopidogrel  75 mg Oral Daily  . furosemide  40 mg Intravenous Q12H  . heparin  5,000 Units Subcutaneous Q8H  . hydrALAZINE  25 mg Oral BID  . insulin aspart  0-15 Units Subcutaneous TID WC  . insulin aspart  12 Units Subcutaneous TID WC  . insulin glargine  57 Units Subcutaneous QHS  . isosorbide mononitrate  30 mg Oral Daily  . metoprolol tartrate  50 mg Oral BID  . sodium chloride  flush  3 mL Intravenous Q12H   Continuous Infusions: . sodium chloride      LOS: 3 days   Kerney Elbe, DO [facility] PAGER is on AMION  If 7PM-7AM, please contact night-coverage [email or web address]

## 2020-05-23 NOTE — Progress Notes (Signed)
Pt ambulated in hallway for a very short distance. Pts oxygen saturation maintained above 93% while ambulating on room air. I will continue to assess.

## 2020-05-23 NOTE — Progress Notes (Signed)
[facility]  ROUNDING NOTE   Subjective:  Creatinine down slightly to 3.89 however no significant change in EGFR noted which is still 12. Reports that her lower extremity edema has improved a bit.  Objective:  Vital signs in last 24 hours:  Temp:  [97.7 F (36.5 C)-98 F (36.7 C)] 97.8 F (36.6 C) (06/04 0802) Pulse Rate:  [58-67] 67 (06/04 0947) Resp:  [14-25] 16 (06/04 0802) BP: (120-147)/(39-56) 137/56 (06/04 0802) SpO2:  [95 %-99 %] 98 % (06/04 0802) Weight:  [623 kg] 111 kg (06/04 0350)  Weight change: 0 kg Filed Weights   05/21/20 0542 05/22/20 0341 05/23/20 0350  Weight: 113.8 kg 111 kg 111 kg    Intake/Output: I/O last 3 completed shifts: In: 597.9 [P.O.:480; IV Piggyback:117.9] Out: 3400 [Urine:3400]   Intake/Output this shift:  No intake/output data recorded.  Physical Exam: General: No acute distress  Head: Normocephalic, atraumatic. Moist oral mucosal membranes  Eyes: Anicteric  Neck: Supple, trachea midline  Lungs:  Basilar rales, normal effort  Heart: S1S2 no rubs  Abdomen:  Soft, nontender, bowel sounds present  Extremities: 2+ peripheral edema.  Neurologic: Awake, alert, following commands  Skin: No lesions       Basic Metabolic Panel: Recent Labs  Lab 05/20/20 1027 05/20/20 1027 05/21/20 0448 05/22/20 0427 05/23/20 0522  NA 137  --  135 134* 136  K 3.8  --  3.7 3.5 3.6  CL 106  --  103 101 99  CO2 19*  --  20* 22 23  GLUCOSE 188*  --  324* 260* 235*  BUN 58*  --  66* 81* 98*  CREATININE 3.28*  --  3.76* 4.01* 3.89*  CALCIUM 8.9   < > 8.7* 8.9 9.2  MG  --   --  1.8 2.0 2.2  PHOS  --   --  3.9 4.9* 6.0*   < > = values in this interval not displayed.    Liver Function Tests: Recent Labs  Lab 05/22/20 0427 05/23/20 0522  AST 25 20  ALT 34 34  ALKPHOS 61 73  BILITOT 0.6 0.8  PROT 7.2 7.7  ALBUMIN 3.2* 3.5   No results for input(s): LIPASE, AMYLASE in the last 168 hours. No results for input(s): AMMONIA in the  last 168 hours.  CBC: Recent Labs  Lab 05/20/20 1027 05/21/20 0448 05/22/20 0427 05/23/20 0522  WBC 15.0* 13.5* 11.9* 13.8*  NEUTROABS  --  10.6* 8.0* 9.3*  HGB 8.5* 7.8* 8.1* 8.8*  HCT 25.4* 23.8* 24.5* 26.1*  MCV 87.6 88.1 87.2 84.2  PLT 253 267 299 345    Cardiac Enzymes: No results for input(s): CKTOTAL, CKMB, CKMBINDEX, TROPONINI in the last 168 hours.  BNP: Invalid input(s): POCBNP  CBG: Recent Labs  Lab 05/22/20 0823 05/22/20 1141 05/22/20 1633 05/22/20 2104 05/23/20 0803  GLUCAP 217* 344* 220* 211* 215*    Microbiology: Results for orders placed or performed during the hospital encounter of 05/20/20  SARS Coronavirus 2 by RT PCR (hospital order, performed in [facility] hospital lab) Nasopharyngeal Nasopharyngeal Swab     Status: None   Collection Time: 05/20/20 12:37 PM   Specimen: Nasopharyngeal Swab  Result Value Ref Range Status   SARS Coronavirus 2 NEGATIVE NEGATIVE Final    Comment: (NOTE) SARS-CoV-2 target nucleic acids are NOT DETECTED. The SARS-CoV-2 RNA is generally detectable in upper and lower respiratory specimens during the acute phase of infection. The lowest concentration of SARS-CoV-2 viral copies this assay can detect  is 250 copies / mL. A negative result does not preclude SARS-CoV-2 infection and should not be used as the sole basis for treatment or other patient management decisions.  A negative result may occur with improper specimen collection / handling, submission of specimen other than nasopharyngeal swab, presence of viral mutation(s) within the areas targeted by this assay, and inadequate number of viral copies (<250 copies / mL). A negative result must be combined with clinical observations, patient history, and epidemiological information. Fact Sheet for Patients:   StrictlyIdeas.no Fact Sheet for Healthcare Providers: BankingDealers.co.za This test is not yet approved or  cleared  by the [place] FDA and has been authorized for detection and/or diagnosis of SARS-CoV-2 by FDA under an Emergency Use Authorization (EUA).  This EUA will remain in effect (meaning this test can be used) for the duration of the COVID-19 declaration under Section 564(b)(1) of the Act, 21 U.S.C. section 360bbb-3(b)(1), unless the authorization is terminated or revoked sooner. Performed at [facility], [address] [place], [place] [address]   Culture, blood (routine x 2)     Status: None (Preliminary result)   Collection Time: 05/20/20 12:38 PM   Specimen: BLOOD  Result Value Ref Range Status   Specimen Description BLOOD BLOOD LEFT ARM  Final   Special Requests   Final    BOTTLES DRAWN AEROBIC AND ANAEROBIC Blood Culture adequate volume   Culture   Final    NO GROWTH 3 DAYS Performed at [facility], [address] [place], [place] [address]    Report Status PENDING  Incomplete  Culture, blood (routine x 2)     Status: None (Preliminary result)   Collection Time: 05/20/20 12:38 PM   Specimen: BLOOD  Result Value Ref Range Status   Specimen Description BLOOD RIGHT ANTECUBITAL  Final   Special Requests   Final    BOTTLES DRAWN AEROBIC AND ANAEROBIC Blood Culture results may not be optimal due to an excessive volume of blood received in culture bottles   Culture   Final    NO GROWTH 3 DAYS Performed at [facility], [address] [place],  [address]    Report Status PENDING  Incomplete    Coagulation Studies: No results for input(s): LABPROT, INR in the last 72 hours.  Urinalysis: No results for input(s): COLORURINE, LABSPEC, PHURINE, GLUCOSEU, HGBUR, BILIRUBINUR, KETONESUR, PROTEINUR, UROBILINOGEN, NITRITE, LEUKOCYTESUR in the last 72 hours.  Invalid input(s): APPERANCEUR    Imaging: DG Chest 1 View  Result Date: 05/23/2020 CLINICAL DATA:  Shortness of breath EXAM: CHEST  1 VIEW COMPARISON:  05/22/2020 FINDINGS:  Cardiomegaly. CABG. There is no edema, consolidation, effusion, or pneumothorax. IMPRESSION: Stable exam.  No evidence of active disease. Electronically Signed   By: Monte Fantasia M.D.   On: 05/23/2020 06:08   DG Chest 1 View  Result Date: 05/22/2020 CLINICAL DATA:  Short of breath EXAM: CHEST  1 VIEW COMPARISON:  05/20/2020 FINDINGS: Improved aeration in the lung bases. Lungs are now clear. No infiltrate effusion or edema. CABG changes noted. IMPRESSION: Interval clearing of bibasilar atelectasis.  No acute abnormality Electronically Signed   By: Franchot Gallo M.D.   On: 05/22/2020 07:53   US RENAL  Result Date: 05/22/2020 CLINICAL DATA:  Acute renal failure EXAM: RENAL / URINARY TRACT ULTRASOUND COMPLETE COMPARISON:  None. FINDINGS: Right Kidney: Renal measurements: 12.6 x 6.6 x 6.9 cm = volume: 302 mL . Echogenicity within normal limits. No mass or hydronephrosis visualized. Left Kidney:  Renal measurements: 14.1 x 6.1 x 5.7 cm = volume: 245 mL. Echogenicity within normal limits. No mass or hydronephrosis visualized. Bladder: Appears normal for degree of bladder distention. Other: None. IMPRESSION: No acute findings.  No hydronephrosis. Electronically Signed   By: Rolm Baptise M.D.   On: 05/22/2020 17:01     Medications:   . sodium chloride     . allopurinol  300 mg Oral Daily  . amLODipine  10 mg Oral QHS  . aspirin EC  81 mg Oral BH-q7a  . clopidogrel  75 mg Oral Daily  . furosemide  40 mg Intravenous Q12H  . heparin  5,000 Units Subcutaneous Q8H  . hydrALAZINE  25 mg Oral BID  . insulin aspart  0-15 Units Subcutaneous TID WC  . insulin aspart  12 Units Subcutaneous TID WC  . insulin glargine  57 Units Subcutaneous QHS  . isosorbide mononitrate  30 mg Oral Daily  . metoprolol tartrate  50 mg Oral BID  . sodium chloride flush  3 mL Intravenous Q12H   sodium chloride, acetaminophen, ondansetron (ZOFRAN) IV, sodium chloride flush  Assessment/ Plan:  59 y.o. female with past medical  history of diastolic heart failure, coronary artery disease, chronic kidney disease stage IV baseline EGFR 23, history of CVA, diabetes mellitus type 2, hypertension, hyperlipidemia, legal blindness, history of myocardial infarction, obesity who was admitted with increasing shortness of breath.  1.  Acute kidney injury/chronic kidney disease stage IV baseline EGFR 23.  eGFR currently is quite low at 13.  A month ago her EGFR was 23.  Diastolic heart failure may be playing a role in her decreased renal function now.  Continue to cautiously use Lasix monitoring renal function closely.  Alternatively this could potentially represent progression of underlying kidney disease.   -Creatinine down slightly at 3.89 however EGFR remains quite low at 12.  In addition BUN has risen to 98.  We talked at length again today about potential initiation of renal replacement therapy.  Suspect that if diuretics were held she would redevelop pulmonary edema and shortness of breath.  However if continued there will likely continue to impact her renal function.  Patient and Dr. Juleen China to discuss this further at length tomorrow.   2.  Anemia of chronic kidney disease.  Given Feraheme 05/22/2020 as iron saturation was low at 18.  Continue to monitor CBC..  3.  Acute on chronic diastolic heart failure.  Okay to continue furosemide 40 mg IV every 12 hours.   LOS: 3 Tonja Jezewski 6/4/202110:48 AM

## 2020-05-24 DIAGNOSIS — Z6841 Body Mass Index (BMI) 40.0 and over, adult: Secondary | ICD-10-CM

## 2020-05-24 LAB — COMPREHENSIVE METABOLIC PANEL
ALT: 29 U/L (ref 0–44)
AST: 17 U/L (ref 15–41)
Albumin: 3.3 g/dL — ABNORMAL LOW (ref 3.5–5.0)
Alkaline Phosphatase: 68 U/L (ref 38–126)
Anion gap: 15 (ref 5–15)
BUN: 98 mg/dL — ABNORMAL HIGH (ref 6–20)
CO2: 21 mmol/L — ABNORMAL LOW (ref 22–32)
Calcium: 9.1 mg/dL (ref 8.9–10.3)
Chloride: 102 mmol/L (ref 98–111)
Creatinine, Ser: 3.54 mg/dL — ABNORMAL HIGH (ref 0.44–1.00)
GFR calc Af Amer: 16 mL/min — ABNORMAL LOW (ref >60)
GFR calc non Af Amer: 13 mL/min — ABNORMAL LOW (ref >60)
Glucose, Bld: 192 mg/dL — ABNORMAL HIGH (ref 70–99)
Potassium: 3.7 mmol/L (ref 3.5–5.1)
Sodium: 138 mmol/L (ref 135–145)
Total Bilirubin: 0.6 mg/dL (ref 0.3–1.2)
Total Protein: 7.2 g/dL (ref 6.5–8.1)

## 2020-05-24 LAB — CBC WITH DIFFERENTIAL/PLATELET
Abs Immature Granulocytes: 0.08 10*3/uL — ABNORMAL HIGH (ref 0.00–0.07)
Basophils Absolute: 0.1 10*3/uL (ref 0.0–0.1)
Basophils Relative: 1 %
Eosinophils Absolute: 0.5 10*3/uL (ref 0.0–0.5)
Eosinophils Relative: 4 %
HCT: 26.5 % — ABNORMAL LOW (ref 36.0–46.0)
Hemoglobin: 8.8 g/dL — ABNORMAL LOW (ref 12.0–15.0)
Immature Granulocytes: 1 %
Lymphocytes Relative: 23 %
Lymphs Abs: 3.1 10*3/uL (ref 0.7–4.0)
MCH: 28.5 pg (ref 26.0–34.0)
MCHC: 33.2 g/dL (ref 30.0–36.0)
MCV: 85.8 fL (ref 80.0–100.0)
Monocytes Absolute: 1.1 10*3/uL — ABNORMAL HIGH (ref 0.1–1.0)
Monocytes Relative: 9 %
Neutro Abs: 8.2 10*3/uL — ABNORMAL HIGH (ref 1.7–7.7)
Neutrophils Relative %: 62 %
Platelets: 343 10*3/uL (ref 150–400)
RBC: 3.09 MIL/uL — ABNORMAL LOW (ref 3.87–5.11)
RDW: 14.5 % (ref 11.5–15.5)
WBC: 13.2 10*3/uL — ABNORMAL HIGH (ref 4.0–10.5)
nRBC: 0 % (ref 0.0–0.2)

## 2020-05-24 LAB — PHOSPHORUS: Phosphorus: 5.5 mg/dL — ABNORMAL HIGH (ref 2.5–4.6)

## 2020-05-24 LAB — GLUCOSE, CAPILLARY
Glucose-Capillary: 187 mg/dL — ABNORMAL HIGH (ref 70–99)
Glucose-Capillary: 289 mg/dL — ABNORMAL HIGH (ref 70–99)

## 2020-05-24 LAB — MAGNESIUM: Magnesium: 2.1 mg/dL (ref 1.7–2.4)

## 2020-05-24 MED ORDER — FUROSEMIDE 20 MG PO TABS: 20.0000 mg | ORAL_TABLET | ORAL | 0 refills | Status: DC

## 2020-05-24 MED ORDER — LANTUS SOLOSTAR 100 UNIT/ML ~~LOC~~ SOPN: 57.0000 [IU] | PEN_INJECTOR | Freq: Every day | SUBCUTANEOUS | 11 refills | Status: DC

## 2020-05-24 MED ORDER — ALLOPURINOL 100 MG PO TABS: 100.0000 mg | ORAL_TABLET | Freq: Every day | ORAL | 0 refills | Status: DC

## 2020-05-24 MED ORDER — HYDRALAZINE HCL 25 MG PO TABS: 25.0000 mg | ORAL_TABLET | Freq: Two times a day (BID) | ORAL | 0 refills | Status: DC

## 2020-05-24 NOTE — Plan of Care (Signed)
  Problem: Education: Goal: Knowledge of General Education information will improve Description: Including pain rating scale, medication(s)/side effects and non-pharmacologic comfort measures Outcome: Adequate for Discharge   Problem: Clinical Measurements: Goal: Ability to maintain clinical measurements within normal limits will improve Outcome: Adequate for Discharge   Problem: Clinical Measurements: Goal: Will remain free from infection Outcome: Adequate for Discharge

## 2020-05-24 NOTE — Progress Notes (Signed)
[facility]  ROUNDING NOTE   Subjective:   Patient states she is breathing well and edema is back to baseline.  Furosemide 10m IV q12  Objective:  Vital signs in last 24 hours:  Temp:  [97.6 F (36.4 C)-98.3 F (36.8 C)] 98.3 F (36.8 C) (06/05 0628) Pulse Rate:  [57-65] 61 (06/05 0628) Resp:  [16-18] 16 (06/05 0628) BP: (111-145)/(45-53) 145/49 (06/05 0628) SpO2:  [94 %-97 %] 97 % (06/05 0628) Weight:  [110.1 kg] 110.1 kg (06/05 0628)  Weight change: -0.862 kg Filed Weights   05/22/20 0341 05/23/20 0350 05/24/20 0628  Weight: 111 kg 111 kg 110.1 kg    Intake/Output: I/O last 3 completed shifts: In: -  Out: 3900 [Urine:3900]   Intake/Output this shift:  Total I/O In: -  Out: 500 [Urine:500]  Physical Exam: General: No acute distress  Head: Normocephalic, atraumatic. Moist oral mucosal membranes  Eyes: Anicteric  Neck: Supple, trachea midline  Lungs:  Basilar rales, normal effort  Heart: bradycardia  Abdomen:  Soft, nontender, bowel sounds present  Extremities: trace peripheral edema.  Neurologic: Awake, alert, following commands  Skin: No lesions       Basic Metabolic Panel: Recent Labs  Lab 05/20/20 1027 05/20/20 1027 05/21/20 0448 05/21/20 0448 05/22/20 0427 05/23/20 0522 05/24/20 0520  NA 137  --  135  --  134* 136 138  K 3.8  --  3.7  --  3.5 3.6 3.7  CL 106  --  103  --  101 99 102  CO2 19*  --  20*  --  22 23 21*  GLUCOSE 188*  --  324*  --  260* 235* 192*  BUN 58*  --  66*  --  81* 98* 98*  CREATININE 3.28*  --  3.76*  --  4.01* 3.89* 3.54*  CALCIUM 8.9   < > 8.7*   < > 8.9 9.2 9.1  MG  --   --  1.8  --  2.0 2.2 2.1  PHOS  --   --  3.9  --  4.9* 6.0* 5.5*   < > = values in this interval not displayed.    Liver Function Tests: Recent Labs  Lab 05/22/20 0427 05/23/20 0522 05/24/20 0520  AST _0 ALT 34 34 29  ALKPHOS 61 73 68  BILITOT 0.6 0.8 0.6  PROT 7.2 7.7 7.2  ALBUMIN 3.2* 3.5 3.3*   No results for  input(s): LIPASE, AMYLASE in the last 168 hours. No results for input(s): AMMONIA in the last 168 hours.  CBC: Recent Labs  Lab 05/20/20 1027 05/21/20 0448 05/22/20 0427 05/23/20 0522 05/24/20 0520  WBC 15.0* 13.5* 11.9* 13.8* 13.2*  NEUTROABS  --  10.6* 8.0* 9.3* 8.2*  HGB 8.5* 7.8* 8.1* 8.8* 8.8*  HCT 25.4* 23.8* 24.5* 26.1* 26.5*  MCV 87.6 88.1 87.2 84.2 85.8  PLT 253 267 299 345 343    Cardiac Enzymes: No results for input(s): CKTOTAL, CKMB, CKMBINDEX, TROPONINI in the last 168 hours.  BNP: Invalid input(s): POCBNP  CBG: Recent Labs  Lab 05/23/20 0803 05/23/20 1127 05/23/20 1648 05/23/20 2142 05/24/20 0818  GLUCAP 215* 279* 90 247* 187*    Microbiology: Results for orders placed or performed during the hospital encounter of 05/20/20  SARS Coronavirus 2 by RT PCR (hospital order[facility]) Nasopharyngeal Nasopharyngeal Swab     Status: None   Collection Time: 05/20/20 12:37 PM   Specimen: Nasopharyngeal Swab  Result  Value Ref Range Status   SARS Coronavirus 2 NEGATIVE NEGATIVE Final    Comment: (NOTE) SARS-CoV-2 target nucleic acids are NOT DETECTED. The SARS-CoV-2 RNA is generally detectable in upper and lower respiratory specimens during the acute phase of infection. The lowest concentration of SARS-CoV-2 viral copies this assay can detect is 250 copies / mL. A negative result does not preclude SARS-CoV-2 infection and should not be used as the sole basis for treatment or other patient management decisions.  A negative result may occur with improper specimen collection / handling, submission of specimen other than nasopharyngeal swab, presence of viral mutation(s) within the areas targeted by this assay, and inadequate number of viral copies (<250 copies / mL). A negative result must be combined with clinical observations, patient history, and epidemiological information. Fact Sheet for Patients:    StrictlyIdeas.no Fact Sheet for Healthcare Providers: BankingDealers.co.za This test is not yet approved or cleared  by the [place] FDA and has been authorized for detection and/or diagnosis of SARS-CoV-2 by FDA under an Emergency Use Authorization (EUA).  This EUA will remain in effect (meaning this test can be used) for the duration of the COVID-19 declaration under Section 564(b)(1) of the Act, 21 U.S.C. section 360bbb-3(b)(1), unless the authorization is terminated or revoked sooner. Performed at [facility], [address] [place], [place] [address]   Culture, blood (routine x 2)     Status: None (Preliminary result)   Collection Time: 05/20/20 12:38 PM   Specimen: BLOOD  Result Value Ref Range Status   Specimen Description BLOOD BLOOD LEFT ARM  Final   Special Requests   Final    BOTTLES DRAWN AEROBIC AND ANAEROBIC Blood Culture adequate volume   Culture   Final    NO GROWTH 4 DAYS Performed at [facility], [address] [place], [place] [address]    Report Status PENDING  Incomplete  Culture, blood (routine x 2)     Status: None (Preliminary result)   Collection Time: 05/20/20 12:38 PM   Specimen: BLOOD  Result Value Ref Range Status   Specimen Description BLOOD RIGHT ANTECUBITAL  Final   Special Requests   Final    BOTTLES DRAWN AEROBIC AND ANAEROBIC Blood Culture results may not be optimal due to an excessive volume of blood received in culture bottles   Culture   Final    NO GROWTH 4 DAYS Performed at [facility], [address] [place], [place] [address]    Report Status PENDING  Incomplete    Coagulation Studies: No results for input(s): LABPROT, INR in the last 72 hours.  Urinalysis: No results for input(s): COLORURINE, LABSPEC, PHURINE, GLUCOSEU, HGBUR, BILIRUBINUR, KETONESUR, PROTEINUR, UROBILINOGEN, NITRITE, LEUKOCYTESUR in the last 72 hours.  Invalid input(s):  APPERANCEUR    Imaging: DG Chest 1 View  Result Date: 05/23/2020 CLINICAL DATA:  Shortness of breath EXAM: CHEST  1 VIEW COMPARISON:  05/22/2020 FINDINGS: Cardiomegaly. CABG. There is no edema, consolidation, effusion, or pneumothorax. IMPRESSION: Stable exam.  No evidence of active disease. Electronically Signed   By: Monte Fantasia M.D.   On: 05/23/2020 06:08   US RENAL  Result Date: 05/22/2020 CLINICAL DATA:  Acute renal failure EXAM: RENAL / URINARY TRACT ULTRASOUND COMPLETE COMPARISON:  None. FINDINGS: Right Kidney: Renal measurements: 12.6 x 6.6 x 6.9 cm = volume: 302 mL . Echogenicity within normal limits. No mass or hydronephrosis visualized. Left Kidney: Renal measurements: 14.1 x 6.1 x 5.7 cm = volume: 245 mL. Echogenicity within  normal limits. No mass or hydronephrosis visualized. Bladder: Appears normal for degree of bladder distention. Other: None. IMPRESSION: No acute findings.  No hydronephrosis. Electronically Signed   By: Rolm Baptise M.D.   On: 05/22/2020 17:01     Medications:   . sodium chloride     . allopurinol  100 mg Oral Daily  . amLODipine  10 mg Oral QHS  . aspirin EC  81 mg Oral BH-q7a  . clopidogrel  75 mg Oral Daily  . furosemide  40 mg Intravenous Q12H  . heparin  5,000 Units Subcutaneous Q8H  . hydrALAZINE  25 mg Oral BID  . insulin aspart  0-15 Units Subcutaneous TID WC  . insulin aspart  12 Units Subcutaneous TID WC  . insulin glargine  57 Units Subcutaneous QHS  . isosorbide mononitrate  30 mg Oral Daily  . metoprolol tartrate  50 mg Oral BID  . sodium chloride flush  3 mL Intravenous Q12H   sodium chloride, acetaminophen, ondansetron (ZOFRAN) IV, sodium chloride flush  Assessment/ Plan:  Sharon Arias is a 59 y.o. white female with chronic diastolic congestive heart failure, coronary artery disease, history of CVA, diabetes mellitus type 2, hypertension, hyperlipidemia, legal blindness, history of myocardial infarction, obesity who was  admitted to [facility] on 05/20/2020 for SOB (shortness of breath) [R06.02] Acute on chronic diastolic CHF (congestive heart failure) ([place]) [I50.33]  1.  Acute kidney injury/chronic kidney disease stage IV baseline EGFR 23.  Chronic kidney disease secondary to diabetic nephropathy Acute renal failure secondary to acute cardiorenal syndrome.  No acute indication for dialysis   2. Acute exacerbation of diastolic congestive heart failure - transition furosemide to 52m every other day.   3. Hypertension: with bradycardia. Current regimen of furosemide, amlodipine, hydralazine, metopolol and isosorbide mononitrate.   4. Anemia of chronic kidney disease.  Given Feraheme 05/22/2020 as iron saturation was low at 18.   Hemoglobin 8.8. Normocytic.   5. Secondary Hyperparathyroidism: PTH 195 on 03/27/20.   LOS: 4 Navid Lenzen 6/5/202110:02 AM

## 2020-05-24 NOTE — Discharge Summary (Signed)
Physician Discharge Summary  Sharon Arias:[id number] DOB: October 19, 1961 DOA: 05/20/2020  PCP: Sharon Crouch, MD  Admit date: 05/20/2020 Discharge date: 05/24/2020  Admitted From: Home Disposition: Home  Recommendations for Outpatient Follow-up:  1. Follow up with PCP in 1-2 weeks 2. Follow up with Nephrology Dr. Abigail Arias on Wednesday 05/28/20 3. Follow up with Cardiology within 1-2 weeks  4. Please obtain CMP/CBC, Mag, Phos in one week 5. Please follow up on the following pending results:  Home Health: No  Equipment/Devices: None  Discharge Condition: Stable CODE STATUS: FULL CODE Diet recommendation: Heart Healthy Carb Modified Fluid Restricted 1200 mL Diet  Brief/Interim Summary: HPI per Dr. Royce Macadamia Arias on 05/20/20 Sharon M Huberis a 59 y.o.femalewith medical history significant forstage IV chronic kidney disease, chronic diastolic dysfunction CHF, coronary artery disease, morbid obesity, diabetes mellitus and hypertension who presents to the emergency room for evaluation of shortness of breath.Patient was recently discharged from the hospital about a week ago. Patient states that her symptoms startedacutely1 day prior to her admission and she feels like she is unable to catch her breath. Symptoms have been constant without relief and is associated with orthopnea and lower extremity swelling. Patient states that she has Lasix prescribed which she takes as needed for swelling and her last dose was 2 days ago. Patient denies having any fever, chills, cough, headache, nausea, vomiting and changes in her bowel habits.She denies having any chest pain, diaphoresis or palpitations. Patient's pulse ox on room air in the field was 92% and she was placed on 2 L of oxygen by EMS. Chest x-ray showsfocal opacity within the peripheral aspect of the left lung base,suspicious for pneumonia. Mild pulmonary vascular congestion and bilateral perihilar interstitial prominence suggesting mild  edema. CT scan of the chest showedbibasilar atelectasis and effusions. Bibasilar infiltrates are less favored. No adenopathy. Twelve-lead EKG shows sinus rhythm with LVH Labs reveal elevated troponin levels down from her last hospitalization. She has a white count of 15,000 and hemoglobin of 8.5 consistent with her baseline.   ED Course:Patient seen in the emergency room for evaluation of shortness of breath, orthopnea and lower extremity swelling. She was placed on 2 L of oxygen in the field with pulse oximetry of 92% on room air. Chest x-ray was suggestive of CHF and possible pneumonia patient received vancomycin and cefepime in the emergency room. She will be admitted to the hospital for further evaluation.  **Interim History  Respiratory status is improved however renal function continued to worsen but is slightly improved today. Cardiology recommended continuing diuretics and will again reassess her volume status and renal function in a.m.  Nephrology consulted for further evaluation and recommendations and recommended cautious use of Lasix with close renal function monitoring.  They are checking ultrasound to make sure that there is no obstruction but she is urinating quite well.  We will change her IV diuresis to 40 mg twice daily after I discussed the case with nephrology.    Patient feels that her edema is improving however EGFR still remains low and creatinine slightly improved. Nephrology is in continuing discussion with the patient about renal replacement therapy and suspect that if diuretics were held she redeveloped pulmonary edema shortness of breath and if there continue that likely impact her renal function further.  She remains on diuresis with IV Lasix 40 mg every 12 and her renal function actually improved somewhat.  Nephrology evaluated and recommending the patient be discharged home as she is close to being euvolemic and  they recommend discharging the patient on Lasix 20 mg  every other day.  Dr. Juleen Arias does not feel that she needs renal replacement therapy at this time and will monitor carefully and will have a follow-up appointment with her on Wednesday.  She is stable to be discharged and will need to follow-up with cardiology as well as PCP within the next few weeks and she has an appointment with nephrology on Wednesday.  Discharge Diagnoses:  Principal Problem:   Acute on chronic diastolic CHF (congestive heart failure) (HCC) Active Problems:   Anemia in chronic kidney disease   CKD (chronic kidney disease), stage IV (HCC)   Coronary artery disease   Type 2 diabetes mellitus with renal complication (HCC)   Morbid obesity with BMI of 45.0-49.9, adult (HCC)  Acute on Chronic Diastolic Dysfunction CHF -Patient presents for evaluation of shortness of breath associated with orthopnea lower extremity swelling in the setting of not taking her Diuretics -On admission her BNP was 726.3 and CT of the chest wo Contrast was done and showed "Bibasilar atelectasis and effusions. Bibasilar infiltrates are less favored.  No adenopathy." -CXR done and showed "Focal opacity within the peripheral aspect of the left lung base, suspicious for pneumonia.  Mild pulmonary vascular congestion and bilateral perihilar interstitial prominence suggesting mild edema." -She received antibiotics in the ED but will not continue as likely the findings on her chest x-ray and CT scan are in the setting of volume overload -PCT was 0.14 and doubt PNA  -Leukocytosis is likely reactive at 15,000 and was trending down to 11,900 but is now bumped to 13.8 but today is 13.2 -Last known LVEFis 55 - 60% -Started patient on Lasix 60 mg IV every 12h but have reduced her to IV 40 mg every 12 after I discussed the case with Dr. Anthonette Arias and will continue and changed to p.o. furosemide 20 mg every other day -Is -8.992 liters since admission and weight is down 8 pounds -C/w Maintain low-sodium  diet -Optimize blood pressure control -Continue nitrates, hydralazine and metoprolol -Strict I's and O's and Daily Weights;  -Check ambulatory home O2 screen prior to discharge; currently off of O2 -Continue to monitor for signs and symptoms of volume overload and her respiratory status is improving but she continues to have some lower extremity edema so we will continue diuresis but a reduced dose -Repeat chest x-ray showed "Stable exam. No evidence of active disease." -She is stable and did not desaturate and will be discharged home on Lasix 20 mg every other day and follow-up with cardiology as well as nephrology closely  Hypertension -Blood pressure is improved -Continued with isosorbide mononitrate 30 mg p.o. daily, hydralazine 75 mg p.o. 3 times daily, metoprolol tartrate 50 mg p.o. twice daily however this may be too much for her so we held her isosorbide mononitrate and reduce her hydralazine dose to 25 mg p.o. 3 times daily and further reduce it down to twice daily -C/w With IV diuresis as above and will be discharged on Lasix 20 mg p.o. every other day -BP Is 109/51  Diabetes Mellitus with complications of stage IV chronic kidney disease -Patient with CHF and metabolic acidosis but without evidence of hyperkalemia. -Patient will need renal replacement therapy in the near future -Maintain consistent carbohydrate diet -Glycemic control withsliding scaleinsulin with Moderat Novolog SSI AC -Have also added Insulin Aspart 12 units TID wm  -Continue long-acting insulin and have increased to 57 units sq Daily -CBGs ranging from  90-289 -Continue  to Monitor Blood Sugars closely and adjust insulin as necessary  Acute on Chronic Kidney Disease Stage IV Metabolic Acidosis, Hyperphosphatemia -Patient's BUN/creatinine went from 58/3.28 -> 66/3.76 -> 81/4.01 and worsened in the setting of diuresis and yesterday was 98/3.89 and today it is 98/3.5 -Continues to remain volume overloaded  so we will continue with IV diuresis as above  -May need nephrology consultation if renal function continues to worsen -Patient's CO2 is 21, chloride level is 102, and anion gap is 15 -Phos Level has gone from 4.9 -> 6.0 -> 5.0 -> 5.5 -Her respiratory status is much improved though -Avoid further nephrotoxic medications, contrast dyes, hypotension and renally adjust medications -Continue with blood pressure control as above -Repeat CMP in the AM  Anemia of Chronic Kidney Disease -Patient's Hgb/Hct went from 8.5/25.4 -> 7.8/23.8 -> 8.1/24.5 -> 8.8/26.1 -> 8.8/26.5 -Check Anemia Panel and showed an iron level of 36, U IBC 164, TIBC of 200, saturation ratios of 18%, ferritin level of 253, folate of 9.8, vitamin B12 of 221 -She received a dose of Ferumoxytol 510 mg IV yesterday per Nephrology  -Continue to Monitor for S/Sx of Bleeding; Currently no overt bleeding noted -Repeat CBC in AM   Elevated Troponin -Most likely secondary to demand ischemia from acute CHF exacerbation allergy does not believe that she has true ACS -Patient has no EKG changes -Continue aspirin and beta-blockers -Cardiology consulted for further evaluation and they feel that the patient has acute demand supply ischemia and they are recommending continuing current therapy as above and continue diuresis as well as deferring cardiac catheterization in light of the patient's renal function status and to further cardiac diagnostics.  They are recommending increasing activity and ambulating today  History of Coronary Artery Disease -Continue Aspirin 81 mg, Clopidogrel 75 mg po Daily, Metoprolol 50 mg po BID -Changed Hydralazine 75 mg po TID to 25 mg po TID, Isosorbide Mononitrate 30 mg po Daily  -Cardiology consulted and they are recommending continue clopidogrel and deferring cardiac catheterization in light of the patient's renal status  Leukocytosis -Likely reactive in the setting of her respiratory status and CHF -WBC  went from 15.0 -> 11.9 -> 13.8 -> 13.2 -Unlikely has an infection so antibiotics were stopped -Continue monitor and trend repeat CBC in a.m.  Obesity -Estimated body mass index is 38.33 kg/m as calculated from the following:   Height as of this encounter: _0  (1.702 m).   Weight as of this encounter: 111 kg. -Weight Loss and Dietary Counseling given   Discharge Instructions  Discharge Instructions    (HEART FAILURE PATIENTS) Call MD:  Anytime you have any of the following symptoms: 1) 3 pound weight gain in 24 hours or 5 pounds in 1 week 2) shortness of breath, with or without a dry hacking cough 3) swelling in the hands, feet or stomach 4) if you have to sleep on extra pillows at night in order to breathe.   Complete by: As directed    Call MD for:  difficulty breathing, headache or visual disturbances   Complete by: As directed    Call MD for:  extreme fatigue   Complete by: As directed    Call MD for:  hives   Complete by: As directed    Call MD for:  persistant dizziness or light-headedness   Complete by: As directed    Call MD for:  persistant nausea and vomiting   Complete by: As directed    Call MD for:  redness, tenderness,  or signs of infection (pain, swelling, redness, odor or green/yellow discharge around incision site)   Complete by: As directed    Call MD for:  severe uncontrolled pain   Complete by: As directed    Call MD for:  temperature >100.4   Complete by: As directed    Diet - low sodium heart healthy   Complete by: As directed    1200 Fluid Restriction   Diet Carb Modified   Complete by: As directed    Discharge instructions   Complete by: As directed    You were cared for by a hospitalist during your hospital stay. If you have any questions about your discharge medications or the care you received while you were in the hospital after you are discharged, you can call the unit and ask to speak with the hospitalist on call if the hospitalist that took care  of you is not available. Once you are discharged, your primary care physician will handle any further medical issues. Please note that NO REFILLS for any discharge medications will be authorized once you are discharged, as it is imperative that you return to your primary care physician (or establish a relationship with a primary care physician if you do not have one) for your aftercare needs so that they can reassess your need for medications and monitor your lab values.  Follow up with PCP, Cardiology, and Nephrology within 1-2 weeks. Take all medications as prescribed. If symptoms change or worsen please return to the ED for evaluation   Increase activity slowly   Complete by: As directed      Allergies as of 05/24/2020      Reactions   Ozempic (0.25 Or 0.5 Mg-dose) [semaglutide(0.25 Or 0.31m-dos)] Diarrhea, Nausea Only   Trulicity [dulaglutide] Diarrhea, Nausea Only      Medication List    STOP taking these medications   amoxicillin 500 MG capsule Commonly known as: AMOXIL     TAKE these medications   acetaminophen 500 MG tablet Commonly known as: TYLENOL Take 1,000 mg by mouth every 8 (eight) hours as needed for pain.   allopurinol 100 MG tablet Commonly known as: ZYLOPRIM Take 1 tablet (100 mg total) by mouth daily. Start taking on: May 25, 2020 What changed:   medication strength  how much to take   amLODipine 10 MG tablet Commonly known as: NORVASC Take 10 mg by mouth at bedtime.   aspirin EC 81 MG tablet Take 81 mg by mouth every morning.   clopidogrel 75 MG tablet Commonly known as: PLAVIX Take 1 tablet (75 mg total) by mouth daily.   fenofibrate 160 MG tablet Take 160 mg by mouth daily.   furosemide 20 MG tablet Commonly known as: LASIX Take 1 tablet (20 mg total) by mouth every other day. What changed: when to take this   hydrALAZINE 25 MG tablet Commonly known as: APRESOLINE Take 1 tablet (25 mg total) by mouth 2 (two) times daily at 10 AM and 5  PM. What changed:   how much to take  when to take this   insulin lispro 100 UNIT/ML KwikPen Commonly known as: HUMALOG Inject 15-30 Units into the skin See admin instructions. Pt. Uses 15 un/ml in the mornings, 25un/ml in the afternoon, and 30 un/ml at bedtime   isosorbide mononitrate 30 MG 24 hr tablet Commonly known as: IMDUR Take 1 tablet (30 mg total) by mouth daily.   Lantus SoloStar 100 UNIT/ML Solostar Pen Generic drug: insulin glargine Inject 57  Units into the skin at bedtime. What changed: how much to take   metoprolol tartrate 50 MG tablet Commonly known as: LOPRESSOR Take 50 mg by mouth 2 (two) times daily.   rosuvastatin 20 MG tablet Commonly known as: CRESTOR Take 20 mg by mouth at bedtime.      Follow-up Information    [facility] Follow up on 05/30/2020.   Specialty: Cardiology Why: at 11:30am. Enter through the [place] entrance Contact information: [address] [place] [phone]       Sharon Crouch, MD. Call.   Specialty: Internal Medicine Why: Follow up within 1-2 weeks Contact information: [place] [address] [phone]        Lavonia Dana, MD. Go to.   Specialty: Nephrology Why: Appointment to be scheduled for you for Wednesday 05/28/20 Contact information: [address] [place] [address] [phone]        Isaias Cowman, MD. Call.   Specialty: Cardiology Why: Follow up within 1-2 weeks Contact information: [facility] [place] [address] [phone]          Allergies  Allergen Reactions  . Ozempic (0.25 Or 0.5 Mg-Dose) [Semaglutide(0.25 Or 0.57m-Dos)] Diarrhea and Nausea Only  . Trulicity [Dulaglutide] Diarrhea and Nausea Only    Consultations:  Cardiology  Nephrology  Procedures/Studies: DG  Chest 1 View  Result Date: 05/23/2020 CLINICAL DATA:  Shortness of breath EXAM: CHEST  1 VIEW COMPARISON:  05/22/2020 FINDINGS: Cardiomegaly. CABG. There is no edema, consolidation, effusion, or pneumothorax. IMPRESSION: Stable exam.  No evidence of active disease. Electronically Signed   By: JMonte FantasiaM.D.   On: 05/23/2020 06:08   DG Chest 1 View  Result Date: 05/22/2020 CLINICAL DATA:  Short of breath EXAM: CHEST  1 VIEW COMPARISON:  05/20/2020 FINDINGS: Improved aeration in the lung bases. Lungs are now clear. No infiltrate effusion or edema. CABG changes noted. IMPRESSION: Interval clearing of bibasilar atelectasis.  No acute abnormality Electronically Signed   By: CFranchot GalloM.D.   On: 05/22/2020 07:53   DG Chest 2 View  Result Date: 05/20/2020 CLINICAL DATA:  Shortness of breath EXAM: CHEST - 2 VIEW COMPARISON:  03/18/2020 FINDINGS: Post CABG. Stable cardiomediastinal contours. Atherosclerotic calcification of the aortic knob. Mild pulmonary vascular congestion and bilateral perihilar interstitial prominence. Focal opacity within the peripheral aspect of the left lung base. Trace left pleural effusion. IMPRESSION: 1. Focal opacity within the peripheral aspect of the left lung base, suspicious for pneumonia. 2. Mild pulmonary vascular congestion and bilateral perihilar interstitial prominence suggesting mild edema. Electronically Signed   By: NDavina PokeD.O.   On: 05/20/2020 10:48   CT Chest Wo Contrast  Result Date: 05/20/2020 CLINICAL DATA:  Anemia.  Pneumonia.  Abnormal chest radiograph EXAM: CT CHEST WITHOUT CONTRAST TECHNIQUE: Multidetector CT imaging of the chest was performed following the standard protocol without IV contrast. COMPARISON:  Radiograph 05/20/2020 FINDINGS: Cardiovascular: Post CABG Mediastinum/Nodes: No axillary or supraclavicular adenopathy. No mediastinal or hilar adenopathy. No pericardial effusion. Esophagus normal. Lungs/Pleura: There is bibasilar  atelectasis with mild consolidation and air bronchograms. Mild ground-glass opacities in lower lobes. Small bilateral pleural effusions. These findings are symmetric LEFT to RIGHT. No nodularity. Upper Abdomen: Limited view of the liver, kidneys, pancreas are unremarkable. Normal adrenal glands. Musculoskeletal: No aggressive osseous lesion. IMPRESSION: 1. Bibasilar atelectasis and effusions. Bibasilar infiltrates are less  favored. 2. No adenopathy. Electronically Signed   By: Suzy Bouchard M.D.   On: 05/20/2020 14:07   US RENAL  Result Date: 05/22/2020 CLINICAL DATA:  Acute renal failure EXAM: RENAL / URINARY TRACT ULTRASOUND COMPLETE COMPARISON:  None. FINDINGS: Right Kidney: Renal measurements: 12.6 x 6.6 x 6.9 cm = volume: 302 mL . Echogenicity within normal limits. No mass or hydronephrosis visualized. Left Kidney: Renal measurements: 14.1 x 6.1 x 5.7 cm = volume: 245 mL. Echogenicity within normal limits. No mass or hydronephrosis visualized. Bladder: Appears normal for degree of bladder distention. Other: None. IMPRESSION: No acute findings.  No hydronephrosis. Electronically Signed   By: Rolm Baptise M.D.   On: 05/22/2020 17:01   CT Renal Stone Study  Result Date: 05/12/2020 CLINICAL DATA:  Lower back and lower abdominal pain, urinary urgency and frequency, intermittent nausea EXAM: CT ABDOMEN AND PELVIS WITHOUT CONTRAST TECHNIQUE: Multidetector CT imaging of the abdomen and pelvis was performed following the standard protocol without IV contrast. COMPARISON:  12/11/2018 FINDINGS: Lower chest: No acute pleural or parenchymal lung disease. Hepatobiliary: Numerous calcified gallstones are again identified, with no evidence of acute cholecystitis. Enhanced imaging of the liver is unremarkable. Pancreas: Unremarkable. No pancreatic ductal dilatation or surrounding inflammatory changes. Spleen: Normal in size without focal abnormality. Adrenals/Urinary Tract: No urinary tract calculi or obstructive  uropathy within either kidney. Bilateral renal cortical atrophy unchanged. The adrenals are unremarkable. Bladder is minimally distended with no focal abnormality. Stomach/Bowel: No bowel obstruction or ileus. Normal appendix right lower quadrant. No bowel wall thickening or inflammatory change. Vascular/Lymphatic: There is severe atherosclerosis of the aorta and its distal branches. No pathologic adenopathy. Reproductive: Uterus and bilateral adnexa are unremarkable. Other: No abdominal wall hernia or abnormality. No abdominopelvic ascites. Musculoskeletal: No acute or destructive bony lesions. Reconstructed images demonstrate no additional findings. IMPRESSION: 1. No urinary tract calculi or obstructive uropathy. 2. Cholelithiasis without evidence of acute cholecystitis. 3. Aortic Atherosclerosis (ICD10-I70.0). Electronically Signed   By: Randa Ngo M.D.   On: 05/12/2020 18:36   US Abdomen Limited RUQ  Result Date: 05/12/2020 CLINICAL DATA:  Right-sided pain EXAM: ULTRASOUND ABDOMEN LIMITED RIGHT UPPER QUADRANT COMPARISON:  CT 05/12/2020 FINDINGS: Gallbladder: Sludge and small stones within the gallbladder. Normal wall thickness. Negative sonographic Murphy. Nodular echogenic foci along the anterior wall of gallbladder, possible cholesterolosis. Common bile duct: Diameter: 1.4 mm Liver: No focal lesion identified. Within normal limits in parenchymal echogenicity. Portal vein is patent on color Doppler imaging with normal direction of blood flow towards the liver. Other: None. IMPRESSION: Sludge and stones within the gallbladder but without other sonographic features to suggest acute cholecystitis. No biliary dilatation. Electronically Signed   By: Donavan Foil M.D.   On: 05/12/2020 21:10     Subjective: Seen and examined at bedside states that her breathing is much better denies any nausea or vomiting.  States that she still has a little bit of lower extremity swelling but states is nothing compared  to when she came in.  Feels well and is stable for discharge home and she will follow-up with PCP, cardiology as well as nephrology outpatient setting.  Discharge Exam: Vitals:   05/24/20 0628 05/24/20 1200  BP: (!) 145/49 (!) 109/51  Pulse: 61 (!) 56  Resp: 16   Temp: 98.3 F (36.8 C) 98.6 F (37 C)  SpO2: 97% 99%   Vitals:   05/23/20 2251 05/24/20 0557 05/24/20 0628 05/24/20 1200  BP: (!) 135/46 (!) 138/53 (!) 145/49 (!) 109/51  Pulse: 60 65 61 (!) 56  Resp:   16   Temp:  97.8 F (36.6 C) 98.3 F (36.8 C) 98.6 F (37 C)  TempSrc:  Oral Oral   SpO2:  96% 97% 99%  Weight:   110.1 kg   Height:       General: Pt is alert, awake, not in acute distress Cardiovascular: RRR, S1/S2 +, no rubs, no gallops Respiratory: Diminished bilaterally, no wheezing, no rhonchi; unlabored breathing Abdominal: Soft, NT, distended secondary to body habitus, bowel sounds + Extremities: 1+ lower extremity edema edema, no cyanosis  The results of significant diagnostics from this hospitalization (including imaging, microbiology, ancillary and laboratory) are listed below for reference.    Microbiology: Recent Results (from the past 240 hour(s))  SARS Coronavirus 2 by RT PCR (hospital order, performed in [facility] hospital lab) Nasopharyngeal Nasopharyngeal Swab     Status: None   Collection Time: 05/20/20 12:37 PM   Specimen: Nasopharyngeal Swab  Result Value Ref Range Status   SARS Coronavirus 2 NEGATIVE NEGATIVE Final    Comment: (NOTE) SARS-CoV-2 target nucleic acids are NOT DETECTED. The SARS-CoV-2 RNA is generally detectable in upper and lower respiratory specimens during the acute phase of infection. The lowest concentration of SARS-CoV-2 viral copies this assay can detect is 250 copies / mL. A negative result does not preclude SARS-CoV-2 infection and should not be used as the sole basis for treatment or other patient management decisions.  A negative result may occur with improper  specimen collection / handling, submission of specimen other than nasopharyngeal swab, presence of viral mutation(s) within the areas targeted by this assay, and inadequate number of viral copies (<250 copies / mL). A negative result must be combined with clinical observations, patient history, and epidemiological information. Fact Sheet for Patients:   StrictlyIdeas.no Fact Sheet for Healthcare Providers: BankingDealers.co.za This test is not yet approved or cleared  by the [place] FDA and has been authorized for detection and/or diagnosis of SARS-CoV-2 by FDA under an Emergency Use Authorization (EUA).  This EUA will remain in effect (meaning this test can be used) for the duration of the COVID-19 declaration under Section 564(b)(1) of the Act, 21 U.S.C. section 360bbb-3(b)(1), unless the authorization is terminated or revoked sooner. Performed at [facility], [address] [place], [place] [address]   Culture, blood (routine x 2)     Status: None (Preliminary result)   Collection Time: 05/20/20 12:38 PM   Specimen: BLOOD  Result Value Ref Range Status   Specimen Description BLOOD BLOOD LEFT ARM  Final   Special Requests   Final    BOTTLES DRAWN AEROBIC AND ANAEROBIC Blood Culture adequate volume   Culture   Final    NO GROWTH 4 DAYS Performed at [facility], [address], [place], [place] [address]    Report Status PENDING  Incomplete  Culture, blood (routine x 2)     Status: None (Preliminary result)   Collection Time: 05/20/20 12:38 PM   Specimen: BLOOD  Result Value Ref Range Status   Specimen Description BLOOD RIGHT ANTECUBITAL  Final   Special Requests   Final    BOTTLES DRAWN AEROBIC AND ANAEROBIC Blood Culture results may not be optimal due to an excessive volume of blood received in culture bottles   Culture   Final    NO GROWTH 4 DAYS Performed at [facility], [address] [place], [place] [address]    Report Status PENDING  Incomplete    Labs: BNP (last 3 results) Recent Labs    03/18/20 1212 05/20/20 1237  BNP 851.0* 573.2*   Basic Metabolic Panel: Recent Labs  Lab 05/20/20 1027 05/21/20 0448 05/22/20 0427 05/23/20 0522 05/24/20 0520  NA 137 135 134* 136 138  K 3.8 3.7 3.5 3.6 3.7  CL 106 103 101 99 102  CO2 19* 20* 22 23 21*  GLUCOSE 188* 324* 260* 235* 192*  BUN 58* 66* 81* 98* 98*  CREATININE 3.28* 3.76* 4.01* 3.89* 3.54*  CALCIUM 8.9 8.7* 8.9 9.2 9.1  MG  --  1.8 2.0 2.2 2.1  PHOS  --  3.9 4.9* 6.0* 5.5*   Liver Function Tests: Recent Labs  Lab 05/22/20 0427 05/23/20 0522 05/24/20 0520  AST _0 ALT 34 34 29  ALKPHOS 61 73 68  BILITOT 0.6 0.8 0.6  PROT 7.2 7.7 7.2  ALBUMIN 3.2* 3.5 3.3*   No results for input(s): LIPASE, AMYLASE in the last 168 hours. No results for input(s): AMMONIA in the last 168 hours. CBC: Recent Labs  Lab 05/20/20 1027 05/21/20 0448 05/22/20 0427 05/23/20 0522 05/24/20 0520  WBC 15.0* 13.5* 11.9* 13.8* 13.2*  NEUTROABS  --  10.6* 8.0* 9.3* 8.2*  HGB 8.5* 7.8* 8.1* 8.8* 8.8*  HCT 25.4* 23.8* 24.5* 26.1* 26.5*  MCV 87.6 88.1 87.2 84.2 85.8  PLT 253 267 299 345 343   Cardiac Enzymes: No results for input(s): CKTOTAL, CKMB, CKMBINDEX, TROPONINI in the last 168 hours. BNP: Invalid input(s): POCBNP CBG: Recent Labs  Lab 05/23/20 1127 05/23/20 1648 05/23/20 2142 05/24/20 0818 05/24/20 1111  GLUCAP 279* 90 247* 187* 289*   D-Dimer No results for input(s): DDIMER in the last 72 hours. Hgb A1c No results for input(s): HGBA1C in the last 72 hours. Lipid Profile No results for input(s): CHOL, HDL, LDLCALC, TRIG, CHOLHDL, LDLDIRECT in the last 72 hours. Thyroid function studies No results for input(s): TSH, T4TOTAL, T3FREE, THYROIDAB in the last 72 hours.  Invalid input(s): FREET3 Anemia work up Recent Labs    05/22/20 0427  VITAMINB12 221  FOLATE 9.8  FERRITIN 253  TIBC  200*  IRON 36  RETICCTPCT 2.5   Urinalysis    Component Value Date/Time   COLORURINE STRAW (A) 05/12/2020 1409   APPEARANCEUR HAZY (A) 05/12/2020 1409   LABSPEC 1.011 05/12/2020 1409   PHURINE 5.0 05/12/2020 1409   GLUCOSEU 150 (A) 05/12/2020 1409   HGBUR LARGE (A) 05/12/2020 1409   BILIRUBINUR NEGATIVE 05/12/2020 1409   KETONESUR NEGATIVE 05/12/2020 1409   PROTEINUR 100 (A) 05/12/2020 1409   NITRITE NEGATIVE 05/12/2020 1409   LEUKOCYTESUR NEGATIVE 05/12/2020 1409   Sepsis Labs Invalid input(s): PROCALCITONIN,  WBC,  LACTICIDVEN Microbiology Recent Results (from the past 240 hour(s))  SARS Coronavirus 2 by RT PCR (hospital order, performed in [facility] hospital lab) Nasopharyngeal Nasopharyngeal Swab     Status: None   Collection Time: 05/20/20 12:37 PM   Specimen: Nasopharyngeal Swab  Result Value Ref Range Status   SARS Coronavirus 2 NEGATIVE NEGATIVE Final    Comment: (NOTE) SARS-CoV-2 target nucleic acids are NOT DETECTED. The SARS-CoV-2 RNA is generally detectable in upper and lower respiratory specimens during the acute phase of infection. The lowest concentration of SARS-CoV-2 viral copies this assay can detect is 250 copies / mL. A negative result does not preclude SARS-CoV-2 infection and should not be used as the sole basis for treatment or other patient management decisions.  A negative result may  occur with improper specimen collection / handling, submission of specimen other than nasopharyngeal swab, presence of viral mutation(s) within the areas targeted by this assay, and inadequate number of viral copies (<250 copies / mL). A negative result must be combined with clinical observations, patient history, and epidemiological information. Fact Sheet for Patients:   StrictlyIdeas.no Fact Sheet for Healthcare Providers: BankingDealers.co.za This test is not yet approved or cleared  by the [place] FDA and has  been authorized for detection and/or diagnosis of SARS-CoV-2 by FDA under an Emergency Use Authorization (EUA).  This EUA will remain in effect (meaning this test can be used) for the duration of the COVID-19 declaration under Section 564(b)(1) of the Act, 21 U.S.C. section 360bbb-3(b)(1), unless the authorization is terminated or revoked sooner. Performed at [facility], [address] [place], [place] [address]   Culture, blood (routine x 2)     Status: None (Preliminary result)   Collection Time: 05/20/20 12:38 PM   Specimen: BLOOD  Result Value Ref Range Status   Specimen Description BLOOD BLOOD LEFT ARM  Final   Special Requests   Final    BOTTLES DRAWN AEROBIC AND ANAEROBIC Blood Culture adequate volume   Culture   Final    NO GROWTH 4 DAYS Performed at [facility], [address] [place], [place] [address]    Report Status PENDING  Incomplete  Culture, blood (routine x 2)     Status: None (Preliminary result)   Collection Time: 05/20/20 12:38 PM   Specimen: BLOOD  Result Value Ref Range Status   Specimen Description BLOOD RIGHT ANTECUBITAL  Final   Special Requests   Final    BOTTLES DRAWN AEROBIC AND ANAEROBIC Blood Culture results may not be optimal due to an excessive volume of blood received in culture bottles   Culture   Final    NO GROWTH 4 DAYS Performed at [facility], [address], [place], [place] [address]    Report Status PENDING  Incomplete   Time coordinating discharge: 35 minutes  SIGNED:  Kerney Elbe, DO [facility] 05/24/2020, 12:43 PM Pager is on [place]  If 7PM-7AM, please contact night-coverage [email or web address]

## 2020-05-25 LAB — CULTURE, BLOOD (ROUTINE X 2)
Culture: NO GROWTH
Culture: NO GROWTH
Special Requests: ADEQUATE

## 2020-05-27 ENCOUNTER — Telehealth: Payer: Self-pay | Admitting: Family

## 2020-05-27 ENCOUNTER — Ambulatory Visit: Payer: Medicare Other

## 2020-05-27 NOTE — Telephone Encounter (Signed)
LVM with patient regarding her follow up CHF Clinic appointment for 6/11 after her recent hospital discharge.   Alyse Low, [place]

## 2020-05-28 ENCOUNTER — Other Ambulatory Visit: Payer: Self-pay | Admitting: Internal Medicine

## 2020-05-28 DIAGNOSIS — Z1231 Encounter for screening mammogram for malignant neoplasm of breast: Secondary | ICD-10-CM

## 2020-05-28 DIAGNOSIS — I5030 Unspecified diastolic (congestive) heart failure: Secondary | ICD-10-CM | POA: Insufficient documentation

## 2020-05-29 ENCOUNTER — Encounter: Payer: Self-pay | Admitting: *Deleted

## 2020-05-29 ENCOUNTER — Telehealth: Payer: Self-pay | Admitting: *Deleted

## 2020-05-29 ENCOUNTER — Ambulatory Visit: Payer: Medicare Other

## 2020-05-29 DIAGNOSIS — I5032 Chronic diastolic (congestive) heart failure: Secondary | ICD-10-CM

## 2020-05-29 NOTE — Telephone Encounter (Signed)
Called to check on pt now that it is June per her request.  She was in hospital last week for AKI and now discussing peritoneal dialysis.  Left message.

## 2020-05-29 NOTE — Progress Notes (Signed)
Patient ID: Sharon Arias, female    DOB: 23-Apr-1961, 59 y.o.   MRN: [id number]  HPI  Sharon Arias is a 59 y/o female with a history of CAD, DM, hyperlipidemia, HTN, CKD, stroke, detached retina, MI, obesity, anemia and chronic heart failure.   Echo report from 03/18/20 reviewed and showed an EF of 55-60% along with trivial TR.  Admitted 05/20/20 due to acute on chronic HF. Patient had not taken diuretics. CT and CXR done. Cardiology consult obtained. Initially given IV lasix with transition to oral diuretics. HTN medications adjusted. Elevated troponin thought to be due to demand ischemia. Discharged after 4 days. Admitted 05/12/20 due to acute on chronic CKD along with UTI. Nephrology consulted. ARB and diuretic held. Given antibiotics for UTI. IVF given for rhabdo. Discharged after 3 days. Admitted 03/18/20 due to acute on chronic HF and acute right pontine infarct. Cardiology and neurology consults obtained. Initially given IV lasix and then transitioned to oral diuretics. Diuretics were held shortly and IVF given to due AKI. Found to have 70% stenosis in right anterior ICA.  Discharged after 4 days.   She presents today for a follow-up visit with a chief complaint of moderate fatigue upon minimal exertion. She describes this as chronic in nature having been present for several years. She has associated shortness of breath, difficulty sleeping and pedal edema along with this. She denies any dizziness, cough, chest pain, palpitations, abdominal distention or weight gain.   She has great difficulty with her vision due to her DM and has to take pictures of instructions and then enlarge it on her phone so that she can read it. Husband has just finished a clinical trial at [facility] for his stage IV sarcoma.   Past Medical History:  Diagnosis Date  . Anemia in chronic kidney disease 05/19/2016  . CAD (coronary artery disease)   . CHF (congestive heart failure) ([place])   . Chicken pox   . Chronic kidney disease     per dr sparks  . CVA (cerebral vascular accident) ([place])   . Detached retina   . Diabetes mellitus without complication ([place])    type 2  . Diabetic neuropathy ([place])   . Diabetic retinopathy ([place])    legally blind  . Hyperlipidemia   . Hypertension   . Myocardial infarction ([place])   . Obesity   . PONV (postoperative nausea and vomiting)   . Sciatica of right side    going to see physiciatry   Past Surgical History:  Procedure Laterality Date  . COLONOSCOPY WITH PROPOFOL N/A 01/26/2016   Procedure: COLONOSCOPY WITH PROPOFOL;  Surgeon: Lollie Sails, MD;  Location: [facility] ENDOSCOPY;  Service: Endoscopy;  Laterality: N/A;  . COLONOSCOPY WITH PROPOFOL N/A 01/27/2016   Procedure: COLONOSCOPY WITH PROPOFOL;  Surgeon: Lollie Sails, MD;  Location[facility] ENDOSCOPY;  Service: Endoscopy;  Laterality: N/A;  . CORONARY ARTERY BYPASS GRAFT  2013  . INCISION AND DRAINAGE     chest abscess  . INCISION AND DRAINAGE ABSCESS N/A 12/11/2018   Procedure: INCISION AND DRAINAGE PERINEAL;  Surgeon: Jules Husbands, MD;  Location: [facility] ORS;  Service: General;  Laterality: N/A;  . RETINAL LASER PROCEDURE     Family History  Problem Relation Age of Onset  . Breast cancer Mother   . Prostate cancer Father        we think mets to liver and bone   Social History   Tobacco Use  . Smoking status: Never Smoker  .  Smokeless tobacco: Never Used  Substance Use Topics  . Alcohol use: No   Allergies  Allergen Reactions  . Ozempic (0.25 Or 0.5 Mg-Dose) [Semaglutide(0.25 Or 0.5mg -Dos)] Diarrhea and Nausea Only  . Trulicity [Dulaglutide] Diarrhea and Nausea Only   Prior to Admission medications   Medication Sig Start Date End Date Taking? Authorizing Provider  acetaminophen (TYLENOL) 500 MG tablet Take 1,000 mg by mouth every 8 (eight) hours as needed for pain. 12/18/18  Yes [provider]  allopurinol (ZYLOPRIM) 100 MG tablet Take 1 tablet (100 mg total) by mouth daily. 05/25/20  Yes Sheikh,  Omair Latif, DO  amLODipine (NORVASC) 10 MG tablet Take 10 mg by mouth at bedtime. 12/04/18  Yes [provider]  aspirin EC 81 MG tablet Take 81 mg by mouth every morning.   Yes [provider]  clopidogrel (PLAVIX) 75 MG tablet Take 1 tablet (75 mg total) by mouth daily. 07/23/19  Yes Vaughan Basta, MD  furosemide (LASIX) 20 MG tablet Take 1 tablet (20 mg total) by mouth every other day. 05/24/20  Yes Sheikh, Omair Latif, DO  hydrALAZINE (APRESOLINE) 25 MG tablet Take 1 tablet (25 mg total) by mouth 2 (two) times daily at 10 AM and 5 PM. 05/24/20  Yes Sheikh, Omair Latif, DO  insulin glargine (LANTUS SOLOSTAR) 100 UNIT/ML Solostar Pen Inject 57 Units into the skin at bedtime. 05/24/20  Yes Sheikh, Omair Latif, DO  insulin lispro (HUMALOG) 100 UNIT/ML KwikPen Inject 15-30 Units into the skin See admin instructions. Pt. Uses 15 un/ml in the mornings, 25un/ml in the afternoon, and 30 un/ml at bedtime 10/02/18  Yes [provider]  isosorbide mononitrate (IMDUR) 30 MG 24 hr tablet Take 1 tablet (30 mg total) by mouth daily. 05/16/20  Yes Allie Bossier, MD  metoprolol tartrate (LOPRESSOR) 50 MG tablet Take 50 mg by mouth 2 (two) times daily.    Yes [provider]  rosuvastatin (CRESTOR) 20 MG tablet Take 20 mg by mouth at bedtime.  03/13/20  Yes [provider]     Review of Systems  Constitutional: Positive for fatigue (all the time). Negative for appetite change.  HENT: Positive for congestion. Negative for postnasal drip and sore throat.   Eyes: Positive for visual disturbance (great vision loss).  Respiratory: Positive for shortness of breath (with moderate exertion). Negative for cough and chest tightness.   Cardiovascular: Positive for leg swelling. Negative for chest pain and palpitations.  Gastrointestinal: Negative for abdominal distention and abdominal pain.  Genitourinary: Negative.   Musculoskeletal: Negative for back pain and neck pain.   Skin: Negative.   Allergic/Immunologic: Negative.   Neurological: Negative for dizziness and light-headedness.  Hematological: Negative for adenopathy. Does not bruise/bleed easily.  Psychiatric/Behavioral: Positive for sleep disturbance (sleeping on 1 pillow with CPAP). Negative for dysphoric mood. The patient is not nervous/anxious.    Vitals:   05/30/20 1150  BP: (!) 132/49  Pulse: (!) 58  Resp: 20  SpO2: 99%  Weight: 253 lb 8 oz (115 kg)  Height: 5\' 7"  (1.702 m)   Wt Readings from Last 3 Encounters:  05/30/20 253 lb 8 oz (115 kg)  05/24/20 242 lb 12.8 oz (110.1 kg)  05/14/20 256 lb 2.8 oz (116.2 kg)   Lab Results  Component Value Date   CREATININE 3.54 (H) 05/24/2020   CREATININE 3.89 (H) 05/23/2020   CREATININE 4.01 (H) 05/22/2020    Physical Exam Vitals and nursing note reviewed.  Constitutional:      Appearance:  Normal appearance.  HENT:     Head: Normocephalic and atraumatic.  Cardiovascular:     Rate and Rhythm: Regular rhythm. Bradycardia present.  Pulmonary:     Effort: Pulmonary effort is normal. No respiratory distress.     Breath sounds: No wheezing or rales.  Abdominal:     General: Abdomen is flat. There is no distension.     Palpations: Abdomen is soft.  Musculoskeletal:        General: No tenderness.     Cervical back: Normal range of motion and neck supple.     Right lower leg: Edema (1+ pitting) present.     Left lower leg: Edema (1+ pitting) present.  Skin:    General: Skin is warm and dry.  Neurological:     Mental Status: She is alert and oriented to person, place, and time. Mental status is at baseline.  Psychiatric:        Mood and Affect: Mood normal.        Behavior: Behavior normal.        Thought Content: Thought content normal.    Assessment & Plan:  1: Chronic heart failure with preserved ejection fraction (no structural changes)- - NYHA class III - euvolemic today - weighing at times but she says that she has to get a  talking scale to weigh consistently; reminded to call for an overnight weight gain of >2 pounds or a weekly weight gain of >5 pounds - weight up 3.8 pounds from last visit here 2 months ago - does not add salt and is using Mrs Deliah [place] for sodium - saw cardiology (Paraschos) 02/29/20 - BNP 05/20/20 was 726.3 - has received her 1st COVID vaccine  - discussed paramedicine program with patient and she is interested; brochure provided to patient and referral sent in  2: HTN- - BP looks good today - saw PCP (Sparks) 05/28/20 - BMP 05/24/20 reviewed and showed sodium 138, potassium 3.7, creatinine 3.54 and GFR 13  3: DM with CKD- - glucose at home today was 241 - saw nephrology (K[place]uru) 05/28/20 & returns in 1 month - discussion has been had about future dialysis - A1c 05/12/20 was 8.5%  4: Lymphedema- - stage 2 - limited in her ability to exercise due to vision difficulties - does wear compression socks but doesn't have them on today; says that she will put them on once she returns home - does elevate her legs when sitting for long periods of time - consider lymphapress compression boots if edema persists   Patient did not bring her medications nor a list. Each medication was verbally reviewed with the patient and she was encouraged to bring the bottles to every visit to confirm accuracy of list.  Return in 3 months or sooner for any questions/problems before then.

## 2020-05-30 ENCOUNTER — Ambulatory Visit: Payer: Medicare Other | Attending: Family | Admitting: Family

## 2020-05-30 ENCOUNTER — Encounter: Payer: Self-pay | Admitting: Family

## 2020-05-30 ENCOUNTER — Other Ambulatory Visit: Payer: Self-pay

## 2020-05-30 VITALS — BP 132/49 | HR 58 | Resp 20 | Ht 67.0 in | Wt 253.5 lb

## 2020-05-30 DIAGNOSIS — I10 Essential (primary) hypertension: Secondary | ICD-10-CM

## 2020-05-30 DIAGNOSIS — Z7901 Long term (current) use of anticoagulants: Secondary | ICD-10-CM | POA: Diagnosis not present

## 2020-05-30 DIAGNOSIS — R5383 Other fatigue: Secondary | ICD-10-CM | POA: Insufficient documentation

## 2020-05-30 DIAGNOSIS — Z79899 Other long term (current) drug therapy: Secondary | ICD-10-CM | POA: Insufficient documentation

## 2020-05-30 DIAGNOSIS — N189 Chronic kidney disease, unspecified: Secondary | ICD-10-CM | POA: Insufficient documentation

## 2020-05-30 DIAGNOSIS — I5032 Chronic diastolic (congestive) heart failure: Secondary | ICD-10-CM

## 2020-05-30 DIAGNOSIS — E1140 Type 2 diabetes mellitus with diabetic neuropathy, unspecified: Secondary | ICD-10-CM | POA: Diagnosis not present

## 2020-05-30 DIAGNOSIS — I509 Heart failure, unspecified: Secondary | ICD-10-CM | POA: Diagnosis not present

## 2020-05-30 DIAGNOSIS — Z8673 Personal history of transient ischemic attack (TIA), and cerebral infarction without residual deficits: Secondary | ICD-10-CM | POA: Insufficient documentation

## 2020-05-30 DIAGNOSIS — I890 Lymphedema, not elsewhere classified: Secondary | ICD-10-CM

## 2020-05-30 DIAGNOSIS — E1122 Type 2 diabetes mellitus with diabetic chronic kidney disease: Secondary | ICD-10-CM | POA: Insufficient documentation

## 2020-05-30 DIAGNOSIS — Z7982 Long term (current) use of aspirin: Secondary | ICD-10-CM | POA: Diagnosis not present

## 2020-05-30 DIAGNOSIS — I130 Hypertensive heart and chronic kidney disease with heart failure and stage 1 through stage 4 chronic kidney disease, or unspecified chronic kidney disease: Secondary | ICD-10-CM | POA: Diagnosis not present

## 2020-05-30 DIAGNOSIS — I2510 Atherosclerotic heart disease of native coronary artery without angina pectoris: Secondary | ICD-10-CM | POA: Insufficient documentation

## 2020-05-30 DIAGNOSIS — Z951 Presence of aortocoronary bypass graft: Secondary | ICD-10-CM | POA: Diagnosis not present

## 2020-05-30 DIAGNOSIS — Z7902 Long term (current) use of antithrombotics/antiplatelets: Secondary | ICD-10-CM | POA: Insufficient documentation

## 2020-05-30 DIAGNOSIS — G479 Sleep disorder, unspecified: Secondary | ICD-10-CM | POA: Insufficient documentation

## 2020-05-30 DIAGNOSIS — R0602 Shortness of breath: Secondary | ICD-10-CM | POA: Insufficient documentation

## 2020-05-30 DIAGNOSIS — Z794 Long term (current) use of insulin: Secondary | ICD-10-CM | POA: Diagnosis not present

## 2020-05-30 DIAGNOSIS — E785 Hyperlipidemia, unspecified: Secondary | ICD-10-CM | POA: Insufficient documentation

## 2020-05-30 DIAGNOSIS — D631 Anemia in chronic kidney disease: Secondary | ICD-10-CM | POA: Diagnosis not present

## 2020-05-30 DIAGNOSIS — E669 Obesity, unspecified: Secondary | ICD-10-CM | POA: Diagnosis not present

## 2020-05-30 DIAGNOSIS — E11319 Type 2 diabetes mellitus with unspecified diabetic retinopathy without macular edema: Secondary | ICD-10-CM | POA: Diagnosis not present

## 2020-05-30 DIAGNOSIS — I252 Old myocardial infarction: Secondary | ICD-10-CM | POA: Diagnosis not present

## 2020-05-30 NOTE — Patient Instructions (Signed)
Continue weighing daily and call for an overnight weight gain of > 2 pounds or a weekly weight gain of >5 pounds. 

## 2020-06-03 ENCOUNTER — Ambulatory Visit: Payer: Medicare Other

## 2020-06-04 ENCOUNTER — Encounter: Payer: Self-pay | Admitting: *Deleted

## 2020-06-04 DIAGNOSIS — I5032 Chronic diastolic (congestive) heart failure: Secondary | ICD-10-CM

## 2020-06-04 NOTE — Progress Notes (Signed)
Pulmonary Individual Treatment Plan  Patient Details  Name: Sharon Arias MRN: [id number] Date of Birth: Nov 19, 1961 Referring Provider:     Pulmonary Rehab from 04/22/2020 in [facility] Cardiac and Pulmonary Rehab  Referring Provider Felipa Furnace MD      Initial Encounter Date:    Pulmonary Rehab from 04/22/2020 in [facility] Cardiac and Pulmonary Rehab  Date 04/22/20      Visit Diagnosis: Heart failure, diastolic, chronic ([place])  Patient's Home Medications on Admission:  Current Outpatient Medications:  .  acetaminophen (TYLENOL) 500 MG tablet, Take 1,000 mg by mouth every 8 (eight) hours as needed for pain., Disp: , Rfl:  .  allopurinol (ZYLOPRIM) 100 MG tablet, Take 1 tablet (100 mg total) by mouth daily., Disp: 30 tablet, Rfl: 0 .  amLODipine (NORVASC) 10 MG tablet, Take 10 mg by mouth at bedtime., Disp: , Rfl:  .  aspirin EC 81 MG tablet, Take 81 mg by mouth every morning., Disp: , Rfl:  .  clopidogrel (PLAVIX) 75 MG tablet, Take 1 tablet (75 mg total) by mouth daily., Disp: 30 tablet, Rfl: 0 .  furosemide (LASIX) 20 MG tablet, Take 1 tablet (20 mg total) by mouth every other day., Disp: 30 tablet, Rfl: 0 .  hydrALAZINE (APRESOLINE) 25 MG tablet, Take 1 tablet (25 mg total) by mouth 2 (two) times daily at 10 AM and 5 PM., Disp: 60 tablet, Rfl: 0 .  insulin glargine (LANTUS SOLOSTAR) 100 UNIT/ML Solostar Pen, Inject 57 Units into the skin at bedtime., Disp: 15 mL, Rfl: 11 .  insulin lispro (HUMALOG) 100 UNIT/ML KwikPen, Inject 15-30 Units into the skin See admin instructions. Pt. Uses 15 un/ml in the mornings, 25un/ml in the afternoon, and 30 un/ml at bedtime, Disp: , Rfl:  .  isosorbide mononitrate (IMDUR) 30 MG 24 hr tablet, Take 1 tablet (30 mg total) by mouth daily., Disp: 30 tablet, Rfl: 0 .  metoprolol tartrate (LOPRESSOR) 50 MG tablet, Take 50 mg by mouth 2 (two) times daily. , Disp: , Rfl:  .  rosuvastatin (CRESTOR) 20 MG tablet, Take 20 mg by mouth at bedtime. , Disp: , Rfl:   Past  Medical History: Past Medical History:  Diagnosis Date  . Anemia in chronic kidney disease 05/19/2016  . CAD (coronary artery disease)   . CHF (congestive heart failure) ([place])   . Chicken pox   . Chronic kidney disease    per dr sparks  . CVA (cerebral vascular accident) ([place])   . Detached retina   . Diabetes mellitus without complication ([place])    type 2  . Diabetic neuropathy ([place])   . Diabetic retinopathy ([place])    legally blind  . Hyperlipidemia   . Hypertension   . Myocardial infarction ([place])   . Obesity   . PONV (postoperative nausea and vomiting)   . Sciatica of right side    going to see physiciatry    Tobacco Use: Social History   Tobacco Use  Smoking Status Never Smoker  Smokeless Tobacco Never Used    Labs: Recent Review Flowsheet Data    Labs for ITP Cardiac and Pulmonary Rehab Latest Ref Rng & Units 03/18/2018 07/22/2019 03/18/2020 03/19/2020 05/12/2020   Cholestrol 0 - 200 mg/dL - 252(H) - 116 -   LDLCALC 0 - 99 mg/dL - UNABLE TO CALCULATE IF TRIGLYCERIDE OVER 400 mg/dL  - 46 -   LDLDIRECT 0 - 99 mg/dL - 141.7(H) - - -   HDL >40 mg/dL - 30(L) - 40(L) -  Trlycerides <150 mg/dL - 402(H) - 149 -   Hemoglobin A1c 4.8 - 5.6 % - 10.8(H) 7.7(H) - 8.5(H)   HCO3 20.0 - 28.0 mmol/L 23.1 - - - -   ACIDBASEDEF 0.0 - 2.0 mmol/L 1.8 - - - -       Pulmonary Assessment Scores:  Pulmonary Assessment Scores    Row Name 04/22/20 1335         ADL UCSD   ADL Phase Entry     SOB Score total 51     Rest 0     Walk 2     Stairs 4     Bath 0     Dress 0     Shop 5       CAT Score   CAT Score 18       mMRC Score   mMRC Score 3            UCSD: Self-administered rating of dyspnea associated with activities of daily living (ADLs) 6-point scale (0 = "not at all" to 5 = "maximal or unable to do because of breathlessness")  Scoring Scores range from 0 to 120.  Minimally important difference is 5 units  CAT: CAT can identify the health impairment of COPD patients  and is better correlated with disease progression.  CAT has a scoring range of zero to 40. The CAT score is classified into four groups of low (less than 10), medium (10 - 20), high (21-30) and very high (31-40) based on the impact level of disease on health status. A CAT score over 10 suggests significant symptoms.  A worsening CAT score could be explained by an exacerbation, poor medication adherence, poor inhaler technique, or progression of COPD or comorbid conditions.  CAT MCID is 2 points  mMRC: mMRC (Modified Medical Research Council) Dyspnea Scale is used to assess the degree of baseline functional disability in patients of respiratory disease due to dyspnea. No minimal important difference is established. A decrease in score of 1 point or greater is considered a positive change.   Pulmonary Function Assessment:   Exercise Target Goals: Exercise Program Goal: Individual exercise prescription set using results from initial 6 min walk test and THRR while considering  patient's activity barriers and safety.   Exercise Prescription Goal: Initial exercise prescription builds to 30-45 minutes a day of aerobic activity, 2-3 days per week.  Home exercise guidelines will be given to patient during program as part of exercise prescription that the participant will acknowledge.  Education: Aerobic Exercise & Resistance Training: - Gives group verbal and written instruction on the various components of exercise. Focuses on aerobic and resistive training programs and the benefits of this training and how to safely progress through these programs..   Education: Exercise & Equipment Safety: - Individual verbal instruction and demonstration of equipment use and safety with use of the equipment.   Pulmonary Rehab from 04/22/2020 in [facility] Cardiac and Pulmonary Rehab  Date 04/22/20  Educator [facility]  Instruction Review Code 1- Verbalizes Understanding      Education: Exercise Physiology & General  Exercise Guidelines: - Group verbal and written instruction with models to review the exercise physiology of the cardiovascular system and associated critical values. Provides general exercise guidelines with specific guidelines to those with heart or lung disease.    Education: Flexibility, Balance, Mind/Body Relaxation: Provides group verbal/written instruction on the benefits of flexibility and balance training, including mind/body exercise modes such as yoga, pilates and tai chi.  Demonstration  and skill practice provided.   Activity Barriers & Risk Stratification:  Activity Barriers & Cardiac Risk Stratification - 04/22/20 1336      Activity Barriers & Cardiac Risk Stratification   Activity Barriers Back Problems;Other (comment);Balance Concerns;Muscular Weakness;Deconditioning;Shortness of Breath;History of Falls    Comments GOUT, Vision Loss, painful to walk, just had crystals reset    Cardiac Risk Stratification High           6 Minute Walk:  6 Minute Walk    Row Name 04/22/20 1330         6 Minute Walk   Phase Initial     Distance 595 feet     Walk Time 4.72 minutes     # of Rest Breaks 2  38 sec, 49 sec then stopped     MPH 1.43     METS 1.46     RPE 17     Perceived Dyspnea  3     VO2 Peak 5.11     Symptoms Yes (comment)     Comments back pain 10/10, SOB, left sided weakness     Resting HR 55 bpm     Resting BP 146/74     Resting Oxygen Saturation  98 %     Exercise Oxygen Saturation  during 6 min walk 98 %     Max Ex. HR 69 bpm     Max Ex. BP 154/64     2 Minute Post BP 148/72       Interval HR   1 Minute HR 69     2 Minute HR 69     3 Minute HR 63     4 Minute HR 65     2 Minute Post HR 59     Interval Heart Rate? Yes       Interval Oxygen   Interval Oxygen? Yes     Baseline Oxygen Saturation % 98 %     1 Minute Oxygen Saturation % 99 %     1 Minute Liters of Oxygen 0 L  Room Air     2 Minute Oxygen Saturation % 98 %     2 Minute Liters of  Oxygen 0 L     3 Minute Oxygen Saturation % 98 %     3 Minute Liters of Oxygen 0 L     4 Minute Oxygen Saturation % 98 %     4 Minute Liters of Oxygen 0 L     5 Minute Oxygen Saturation % 98 %     5 Minute Liters of Oxygen 0 L     2 Minute Post Oxygen Saturation % 98 %     2 Minute Post Liters of Oxygen 0 L           Oxygen Initial Assessment:  Oxygen Initial Assessment - 04/08/20 1423      Home Oxygen   Home Oxygen Device None    Sleep Oxygen Prescription None    Home Exercise Oxygen Prescription None    Home at Rest Exercise Oxygen Prescription None      Initial 6 min Walk   Oxygen Used None      Program Oxygen Prescription   Program Oxygen Prescription None      Intervention   Short Term Goals To learn and understand importance of monitoring SPO2 with pulse oximeter and demonstrate accurate use of the pulse oximeter.;To learn and understand importance of maintaining oxygen saturations>88%;To learn and demonstrate proper pursed lip breathing  techniques or other breathing techniques.    Long  Term Goals Verbalizes importance of monitoring SPO2 with pulse oximeter and return demonstration;Maintenance of O2 saturations>88%;Exhibits proper breathing techniques, such as pursed lip breathing or other method taught during program session           Oxygen Re-Evaluation:   Oxygen Discharge (Final Oxygen Re-Evaluation):   Initial Exercise Prescription:  Initial Exercise Prescription - 04/22/20 1300      Date of Initial Exercise RX and Referring Provider   Date 04/22/20    Referring Provider Felipa Furnace MD      Treadmill   MPH 0.5    Grade 0    Minutes 15    METs 1.4      NuStep   Level 1    SPM 80    Minutes 15    METs 1.5      T5 Nustep   Level 1    SPM 80    Minutes 15    METs 1.5      Biostep-RELP   Level 1    SPM 50    Minutes 15    METs 1      Prescription Details   Frequency (times per week) 2    Duration Progress to 30 minutes of  continuous aerobic without signs/symptoms of physical distress      Intensity   THRR 40-80% of Max Heartrate 98-141    Ratings of Perceived Exertion 11-13    Perceived Dyspnea 0-4      Progression   Progression Continue to progress workloads to maintain intensity without signs/symptoms of physical distress.      Resistance Training   Training Prescription Yes    Weight 3 lb    Reps 10-15           Perform Capillary Blood Glucose checks as needed.  Exercise Prescription Changes:  Exercise Prescription Changes    Row Name 04/22/20 1300             Response to Exercise   Blood Pressure (Admit) 146/74       Blood Pressure (Exercise) 154/64       Blood Pressure (Exit) 142/80       Heart Rate (Admit) 55 bpm       Heart Rate (Exercise) 69 bpm       Heart Rate (Exit) 58 bpm       Oxygen Saturation (Admit) 98 %       Oxygen Saturation (Exercise) 98 %       Oxygen Saturation (Exit) 98 %       Rating of Perceived Exertion (Exercise) 17       Perceived Dyspnea (Exercise) 3       Symptoms back pain 10/10, left sided weakness, SOB       Comments walk test results              Exercise Comments:   Exercise Goals and Review:  Exercise Goals    Row Name 04/22/20 1339             Exercise Goals   Increase Physical Activity Yes       Intervention Provide advice, education, support and counseling about physical activity/exercise needs.;Develop an individualized exercise prescription for aerobic and resistive training based on initial evaluation findings, risk stratification, comorbidities and participant's personal goals.       Expected Outcomes Short Term: Attend rehab on a regular basis to increase amount of physical activity.;Long Term: Add in  home exercise to make exercise part of routine and to increase amount of physical activity.;Long Term: Exercising regularly at least 3-5 days a week.       Increase Strength and Stamina Yes       Intervention Provide advice,  education, support and counseling about physical activity/exercise needs.;Develop an individualized exercise prescription for aerobic and resistive training based on initial evaluation findings, risk stratification, comorbidities and participant's personal goals.       Expected Outcomes Short Term: Increase workloads from initial exercise prescription for resistance, speed, and METs.;Short Term: Perform resistance training exercises routinely during rehab and add in resistance training at home;Long Term: Improve cardiorespiratory fitness, muscular endurance and strength as measured by increased METs and functional capacity (6MWT)       Able to understand and use rate of perceived exertion (RPE) scale Yes       Intervention Provide education and explanation on how to use RPE scale       Expected Outcomes Short Term: Able to use RPE daily in rehab to express subjective intensity level;Long Term:  Able to use RPE to guide intensity level when exercising independently       Able to understand and use Dyspnea scale Yes       Intervention Provide education and explanation on how to use Dyspnea scale       Expected Outcomes Short Term: Able to use Dyspnea scale daily in rehab to express subjective sense of shortness of breath during exertion;Long Term: Able to use Dyspnea scale to guide intensity level when exercising independently       Knowledge and understanding of Target Heart Rate Range (THRR) Yes       Intervention Provide education and explanation of THRR including how the numbers were predicted and where they are located for reference       Expected Outcomes Short Term: Able to state/look up THRR;Short Term: Able to use daily as guideline for intensity in rehab;Long Term: Able to use THRR to govern intensity when exercising independently       Able to check pulse independently Yes       Intervention Provide education and demonstration on how to check pulse in carotid and radial arteries.;Review the  importance of being able to check your own pulse for safety during independent exercise       Expected Outcomes Short Term: Able to explain why pulse checking is important during independent exercise;Long Term: Able to check pulse independently and accurately       Understanding of Exercise Prescription Yes       Intervention Provide education, explanation, and written materials on patient's individual exercise prescription       Expected Outcomes Short Term: Able to explain program exercise prescription;Long Term: Able to explain home exercise prescription to exercise independently              Exercise Goals Re-Evaluation :  Exercise Goals Re-Evaluation    Row Name 05/13/20 1607 05/29/20 1236           Exercise Goal Re-Evaluation   Comments Pt has not started exercise sessions yet Pt has not started exercise sessions yet             Discharge Exercise Prescription (Final Exercise Prescription Changes):  Exercise Prescription Changes - 04/22/20 1300      Response to Exercise   Blood Pressure (Admit) 146/74    Blood Pressure (Exercise) 154/64    Blood Pressure (Exit) 142/80    Heart Rate (  Admit) 55 bpm    Heart Rate (Exercise) 69 bpm    Heart Rate (Exit) 58 bpm    Oxygen Saturation (Admit) 98 %    Oxygen Saturation (Exercise) 98 %    Oxygen Saturation (Exit) 98 %    Rating of Perceived Exertion (Exercise) 17    Perceived Dyspnea (Exercise) 3    Symptoms back pain 10/10, left sided weakness, SOB    Comments walk test results           Nutrition:  Target Goals: Understanding of nutrition guidelines, daily intake of sodium '1500mg'$ , cholesterol '200mg'$ , calories 30% from fat and 7% or less from saturated fats, daily to have 5 or more servings of fruits and vegetables.  Education: Controlling Sodium/Reading Food Labels -Group verbal and written material supporting the discussion of sodium use in heart healthy nutrition. Review and explanation with models, verbal and  written materials for utilization of the food label.   Education: General Nutrition Guidelines/Fats and Fiber: -Group instruction provided by verbal, written material, models and posters to present the general guidelines for heart healthy nutrition. Gives an explanation and review of dietary fats and fiber.   Biometrics:  Pre Biometrics - 04/22/20 1333      Pre Biometrics   Height 5' 7.2" (1.707 m)    Weight 251 lb 14.4 oz (114.3 kg)    BMI (Calculated) 39.21    Single Leg Stand 0 seconds            Nutrition Therapy Plan and Nutrition Goals:   Nutrition Assessments:  Nutrition Assessments - 04/22/20 1333      MEDFICTS Scores   Pre Score 27           MEDIFICTS Score Key:          ?70 Need to make dietary changes          40-70 Heart Healthy Diet         ? 40 Therapeutic Level Cholesterol Diet  Nutrition Goals Re-Evaluation:   Nutrition Goals Discharge (Final Nutrition Goals Re-Evaluation):   Psychosocial: Target Goals: Acknowledge presence or absence of significant depression and/or stress, maximize coping skills, provide positive support system. Participant is able to verbalize types and ability to use techniques and skills needed for reducing stress and depression.   Education: Depression - Provides group verbal and written instruction on the correlation between heart/lung disease and depressed mood, treatment options, and the stigmas associated with seeking treatment.   Education: Sleep Hygiene -Provides group verbal and written instruction about how sleep can affect your health.  Define sleep hygiene, discuss sleep cycles and impact of sleep habits. Review good sleep hygiene tips.    Education: Stress and Anxiety: - Provides group verbal and written instruction about the health risks of elevated stress and causes of high stress.  Discuss the correlation between heart/lung disease and anxiety and treatment options. Review healthy ways to manage with stress  and anxiety.   Cardiac Rehab from 03/23/2017 in [facility] Cardiac and Pulmonary Rehab  Date 03/23/17  Educator [facility]  Instruction Review Code (retired) 2- meets goals/outcomes      Initial Review & Psychosocial Screening:  Initial Psych Review & Screening - 04/08/20 1414      Initial Review   Current issues with Current Sleep Concerns    Source of Stress Concerns Chronic Illness    Comments Not able to sleep straight throught, but gets at least [place]?  Yes   husband     Barriers   Psychosocial barriers to participate in program There are no identifiable barriers or psychosocial needs.;The patient should benefit from training in stress management and relaxation.      Screening Interventions   Interventions Encouraged to exercise;To provide support and resources with identified psychosocial needs    Expected Outcomes Short Term goal: Utilizing psychosocial counselor, staff and physician to assist with identification of specific Stressors or current issues interfering with healing process. Setting desired goal for each stressor or current issue identified.;Long Term Goal: Stressors or current issues are controlled or eliminated.;Short Term goal: Identification and review with participant of any Quality of Life or Depression concerns found by scoring the questionnaire.;Long Term goal: The participant improves quality of Life and PHQ9 Scores as seen by post scores and/or verbalization of changes           Quality of Life Scores:  Scores of 19 and below usually indicate a poorer quality of life in these areas.  A difference of  2-3 points is a clinically meaningful difference.  A difference of 2-3 points in the total score of the Quality of Life Index has been associated with significant improvement in overall quality of life, self-image, physical symptoms, and general health in studies assessing change in quality of life.  PHQ-9: Recent Review Flowsheet  Data    Depression screen [facility] 2/9 04/22/2020 03/14/2017   Decreased Interest 0 0   Down, Depressed, Hopeless 0 0   PHQ - 2 Score 0 0   Altered sleeping 1 2    Tired, decreased energy 2 3    Change in appetite 0 0   Feeling bad or failure about yourself  0 0   Trouble concentrating 0 0   Moving slowly or fidgety/restless 0 0   Suicidal thoughts 0 0   PHQ-9 Score 3 5   Difficult doing work/chores Somewhat difficult Somewhat difficult     Interpretation of Total Score  Total Score Depression Severity:  1-4 = Minimal depression, 5-9 = Mild depression, 10-14 = Moderate depression, 15-19 = Moderately severe depression, 20-27 = Severe depression   Psychosocial Evaluation and Intervention:  Psychosocial Evaluation - 04/08/20 1417      Psychosocial Evaluation & Interventions   Interventions Encouraged to exercise with the program and follow exercise prescription    Comments Malu is returning to rehab for heart failure.  She has a great support system in her husband.  She has a lot of chronic issues that she has been dealing with continuously.  She was just discharge from hospital from a second stroke. Her strokes have left her with a visual and hearing impairment.  Overall is does well mentally.  She does not sleep well continuously but gets at least 6 hours total each day.  She is hoping to build back her strength and stamina and improve her endurance.  She wants to be able to move better overall.    Expected Outcomes Short: Attend rehab regularly to build stamina  Long: Continue to be able to do more and feel better    Continue Psychosocial Services  Follow up required by staff           Psychosocial Re-Evaluation:   Psychosocial Discharge (Final Psychosocial Re-Evaluation):   Education: Education Goals: Education classes will be provided on a weekly basis, covering required topics. Participant will state understanding/return demonstration of topics presented.  Learning  Barriers/Preferences:  Learning Barriers/Preferences - 04/08/20 1414  Learning Barriers/Preferences   Learning Barriers Sight;Hearing   impaired vision from stroke, and left sided hearing impairment   Learning Preferences Audio;Verbal Instruction           General Pulmonary Education Topics:  Infection Prevention: - Provides verbal and written material to individual with discussion of infection control including proper hand washing and proper equipment cleaning during exercise session.   Pulmonary Rehab from 04/22/2020 in [facility] Cardiac and Pulmonary Rehab  Date 04/22/20  Educator [facility]  Instruction Review Code 1- Verbalizes Understanding      Falls Prevention: - Provides verbal and written material to individual with discussion of falls prevention and safety.   Pulmonary Rehab from 04/22/2020 in [facility] Cardiac and Pulmonary Rehab  Date 04/22/20  Educator [facility]  Instruction Review Code 1- Verbalizes Understanding      Chronic Lung Diseases: - Group verbal and written instruction to review updates, respiratory medications, advancements in procedures and treatments. Discuss use of supplemental oxygen including available portable oxygen systems, continuous and intermittent flow rates, concentrators, personal use and safety guidelines. Review proper use of inhaler and spacers. Provide informative websites for self-education.    Energy Conservation: - Provide group verbal and written instruction for methods to conserve energy, plan and organize activities. Instruct on pacing techniques, use of adaptive equipment and posture/positioning to relieve shortness of breath.   Triggers and Exacerbations: - Group verbal and written instruction to review types of environmental triggers and ways to prevent exacerbations. Discuss weather changes, air quality and the benefits of nasal washing. Review warning signs and symptoms to help prevent infections. Discuss techniques for effective airway clearance,  coughing, and vibrations.   AED/CPR: - Group verbal and written instruction with the use of models to demonstrate the basic use of the AED with the basic ABC's of resuscitation.   Anatomy and Physiology of the Lungs: - Group verbal and written instruction with the use of models to provide basic lung anatomy and physiology related to function, structure and complications of lung disease.   Anatomy & Physiology of the Heart: - Group verbal and written instruction and models provide basic cardiac anatomy and physiology, with the coronary electrical and arterial systems. Review of Valvular disease and Heart Failure   Cardiac Rehab from 03/23/2017 in [facility] Cardiac and Pulmonary Rehab  Date 03/21/17  Educator [facility]  Instruction Review Code (retired) 2- meets goals/outcomes      Cardiac Medications: - Group verbal and written instruction to review commonly prescribed medications for heart disease. Reviews the medication, class of the drug, and side effects.   Other: -Provides group and verbal instruction on various topics (see comments)   Knowledge Questionnaire Score:  Knowledge Questionnaire Score - 04/22/20 1333      Knowledge Questionnaire Score   Pre Score 16/18 Education Focus: O2 safety            Core Components/Risk Factors/Patient Goals at Admission:  Personal Goals and Risk Factors at Admission - 04/22/20 1334      Core Components/Risk Factors/Patient Goals on Admission    Weight Management Yes;Weight Loss;Obesity    Intervention Weight Management: Develop a combined nutrition and exercise program designed to reach desired caloric intake, while maintaining appropriate intake of nutrient and fiber, sodium and fats, and appropriate energy expenditure required for the weight goal.;Weight Management: Provide education and appropriate resources to help participant work on and attain dietary goals.;Weight Management/Obesity: Establish reasonable short term and long term weight  goals.;Obesity: Provide education and appropriate resources to help participant work  on and attain dietary goals.    Admit Weight 251 lb 14.4 oz (114.3 kg)    Goal Weight: Short Term 246 lb (111.6 kg)    Goal Weight: Long Term 240 lb (108.9 kg)    Expected Outcomes Short Term: Continue to assess and modify interventions until short term weight is achieved;Long Term: Adherence to nutrition and physical activity/exercise program aimed toward attainment of established weight goal;Weight Loss: Understanding of general recommendations for a balanced deficit meal plan, which promotes 1-2 lb weight loss per week and includes a negative energy balance of [phone] kcal/d;Understanding recommendations for meals to include 15-35% energy as protein, 25-35% energy from fat, 35-60% energy from carbohydrates, less than '200mg'$  of dietary cholesterol, 20-35 gm of total fiber daily;Understanding of distribution of calorie intake throughout the day with the consumption of 4-5 meals/snacks    Improve shortness of breath with ADL's Yes    Intervention Provide education, individualized exercise plan and daily activity instruction to help decrease symptoms of SOB with activities of daily living.    Expected Outcomes Short Term: Improve cardiorespiratory fitness to achieve a reduction of symptoms when performing ADLs;Long Term: Be able to perform more ADLs without symptoms or delay the onset of symptoms    Diabetes Yes    Intervention Provide education about signs/symptoms and action to take for hypo/hyperglycemia.;Provide education about proper nutrition, including hydration, and aerobic/resistive exercise prescription along with prescribed medications to achieve blood glucose in normal ranges: Fasting glucose 65-99 mg/dL    Expected Outcomes Short Term: Participant verbalizes understanding of the signs/symptoms and immediate care of hyper/hypoglycemia, proper foot care and importance of medication, aerobic/resistive exercise and  nutrition plan for blood glucose control.;Long Term: Attainment of HbA1C < 7%.    Heart Failure Yes    Intervention Provide a combined exercise and nutrition program that is supplemented with education, support and counseling about heart failure. Directed toward relieving symptoms such as shortness of breath, decreased exercise tolerance, and extremity edema.    Expected Outcomes Improve functional capacity of life;Short term: Attendance in program 2-3 days a week with increased exercise capacity. Reported lower sodium intake. Reported increased fruit and vegetable intake. Reports medication compliance.;Long term: Adoption of self-care skills and reduction of barriers for early signs and symptoms recognition and intervention leading to self-care maintenance.;Short term: Daily weights obtained and reported for increase. Utilizing diuretic protocols set by physician.    Hypertension Yes    Intervention Provide education on lifestyle modifcations including regular physical activity/exercise, weight management, moderate sodium restriction and increased consumption of fresh fruit, vegetables, and low fat dairy, alcohol moderation, and smoking cessation.;Monitor prescription use compliance.    Expected Outcomes Short Term: Continued assessment and intervention until BP is < 140/64m HG in hypertensive participants. < 130/844mHG in hypertensive participants with diabetes, heart failure or chronic kidney disease.;Long Term: Maintenance of blood pressure at goal levels.    Lipids Yes    Intervention Provide education and support for participant on nutrition & aerobic/resistive exercise along with prescribed medications to achieve LDL '70mg'$ , HDL >'40mg'$ .    Expected Outcomes Short Term: Participant states understanding of desired cholesterol values and is compliant with medications prescribed. Participant is following exercise prescription and nutrition guidelines.;Long Term: Cholesterol controlled with medications as  prescribed, with individualized exercise RX and with personalized nutrition plan. Value goals: LDL < '70mg'$ , HDL > 40 mg.           Education:Diabetes - Individual verbal and written instruction to review signs/symptoms of diabetes, desired  ranges of glucose level fasting, after meals and with exercise. Acknowledge that pre and post exercise glucose checks will be done for 3 sessions at entry of program.   Pulmonary Rehab from 04/22/2020 in [facility]  Date 04/22/20  Educator [facility]  Instruction Review Code 1- Verbalizes Understanding      Education: Know Your Numbers and Risk Factors: -Group verbal and written instruction about important numbers in your health.  Discussion of what are risk factors and how they play a role in the disease process.  Review of Cholesterol, Blood Pressure, Diabetes, and BMI and the role they play in your overall health.   Core Components/Risk Factors/Patient Goals Review:    Core Components/Risk Factors/Patient Goals at Discharge (Final Review):    ITP Comments:  ITP Comments    Row Name 04/08/20 1426 04/22/20 1330 05/05/20 1045 05/07/20 0614 05/13/20 1607   ITP Comments Completed virtual orientation today.  EP evaluation is scheduled for Monday 4/26 at 3pm.  Documentation for diagnosis can be found in [facility] encounter 03/18/20. Completed 6MWT and gym orientation.  Initial ITP created and sent for review to Dr. Emily Filbert, Medical Director. [place]ay left [place] a message yesterday.  Returned call today, left message.  She would like to delay her start in the program until June.  Her husband is still having to go back and forth to [facility] daily for treatments which makes it very hard for her to get here regularly since he is her means of transportation.  We will hold her start until June. 30 Day review completed. ITP review done, changes made as directed,and approval shown by signature of  Scientist, research (life sciences). Still waiting to start next month.   [place]  Name 05/29/20 1236 06/04/20 0558         ITP Comments Called to check on pt now that it is June per her request.  She was in hospital last week for AKI and now discussing peritoneal dialysis.  Left message. 30 Day review completed. Medical Director ITP review done, changes made as directed, and signed approval by Medical Director.             Comments: 30 Day review completed. Medical Director ITP review done, changes made as directed, and signed approval by Medical Director.

## 2020-06-05 ENCOUNTER — Ambulatory Visit: Payer: Medicare Other

## 2020-06-09 ENCOUNTER — Encounter: Payer: Self-pay | Admitting: *Deleted

## 2020-06-09 ENCOUNTER — Telehealth: Payer: Self-pay | Admitting: *Deleted

## 2020-06-09 DIAGNOSIS — I5032 Chronic diastolic (congestive) heart failure: Secondary | ICD-10-CM

## 2020-06-09 NOTE — Telephone Encounter (Signed)
Called to check on patient for when to start.  Left message.  Will need to hear back or will discharge on 7/2.

## 2020-06-10 ENCOUNTER — Ambulatory Visit: Payer: Medicare Other

## 2020-06-12 ENCOUNTER — Ambulatory Visit: Payer: Medicare Other

## 2020-06-16 ENCOUNTER — Telehealth (HOSPITAL_COMMUNITY): Payer: Self-pay

## 2020-06-16 NOTE — Telephone Encounter (Signed)
Home visit was cancelled last week due to her husband had appt.  Attempted to contact this morning to set up new visit.  Left message.   [place] [phone]

## 2020-06-17 ENCOUNTER — Ambulatory Visit: Payer: Medicare Other

## 2020-06-19 ENCOUNTER — Ambulatory Visit: Payer: Medicare Other

## 2020-06-24 ENCOUNTER — Encounter: Payer: Self-pay | Admitting: *Deleted

## 2020-06-24 ENCOUNTER — Ambulatory Visit: Payer: Medicare Other

## 2020-06-24 DIAGNOSIS — I5032 Chronic diastolic (congestive) heart failure: Secondary | ICD-10-CM

## 2020-06-24 NOTE — Progress Notes (Signed)
Pulmonary Individual Treatment Plan  Patient Details  Name: Sharon Arias MRN: [id number] Date of Birth: Nov 19, 1961 Referring Provider:     Pulmonary Rehab from 04/22/2020 in [facility] Cardiac and Pulmonary Rehab  Referring Provider Felipa Furnace MD      Initial Encounter Date:    Pulmonary Rehab from 04/22/2020 in [facility] Cardiac and Pulmonary Rehab  Date 04/22/20      Visit Diagnosis: Heart failure, diastolic, chronic ([place])  Patient's Home Medications on Admission:  Current Outpatient Medications:  .  acetaminophen (TYLENOL) 500 MG tablet, Take 1,000 mg by mouth every 8 (eight) hours as needed for pain., Disp: , Rfl:  .  allopurinol (ZYLOPRIM) 100 MG tablet, Take 1 tablet (100 mg total) by mouth daily., Disp: 30 tablet, Rfl: 0 .  amLODipine (NORVASC) 10 MG tablet, Take 10 mg by mouth at bedtime., Disp: , Rfl:  .  aspirin EC 81 MG tablet, Take 81 mg by mouth every morning., Disp: , Rfl:  .  clopidogrel (PLAVIX) 75 MG tablet, Take 1 tablet (75 mg total) by mouth daily., Disp: 30 tablet, Rfl: 0 .  furosemide (LASIX) 20 MG tablet, Take 1 tablet (20 mg total) by mouth every other day., Disp: 30 tablet, Rfl: 0 .  hydrALAZINE (APRESOLINE) 25 MG tablet, Take 1 tablet (25 mg total) by mouth 2 (two) times daily at 10 AM and 5 PM., Disp: 60 tablet, Rfl: 0 .  insulin glargine (LANTUS SOLOSTAR) 100 UNIT/ML Solostar Pen, Inject 57 Units into the skin at bedtime., Disp: 15 mL, Rfl: 11 .  insulin lispro (HUMALOG) 100 UNIT/ML KwikPen, Inject 15-30 Units into the skin See admin instructions. Pt. Uses 15 un/ml in the mornings, 25un/ml in the afternoon, and 30 un/ml at bedtime, Disp: , Rfl:  .  isosorbide mononitrate (IMDUR) 30 MG 24 hr tablet, Take 1 tablet (30 mg total) by mouth daily., Disp: 30 tablet, Rfl: 0 .  metoprolol tartrate (LOPRESSOR) 50 MG tablet, Take 50 mg by mouth 2 (two) times daily. , Disp: , Rfl:  .  rosuvastatin (CRESTOR) 20 MG tablet, Take 20 mg by mouth at bedtime. , Disp: , Rfl:   Past  Medical History: Past Medical History:  Diagnosis Date  . Anemia in chronic kidney disease 05/19/2016  . CAD (coronary artery disease)   . CHF (congestive heart failure) ([place])   . Chicken pox   . Chronic kidney disease    per dr sparks  . CVA (cerebral vascular accident) ([place])   . Detached retina   . Diabetes mellitus without complication ([place])    type 2  . Diabetic neuropathy ([place])   . Diabetic retinopathy ([place])    legally blind  . Hyperlipidemia   . Hypertension   . Myocardial infarction ([place])   . Obesity   . PONV (postoperative nausea and vomiting)   . Sciatica of right side    going to see physiciatry    Tobacco Use: Social History   Tobacco Use  Smoking Status Never Smoker  Smokeless Tobacco Never Used    Labs: Recent Review Flowsheet Data    Labs for ITP Cardiac and Pulmonary Rehab Latest Ref Rng & Units 03/18/2018 07/22/2019 03/18/2020 03/19/2020 05/12/2020   Cholestrol 0 - 200 mg/dL - 252(H) - 116 -   LDLCALC 0 - 99 mg/dL - UNABLE TO CALCULATE IF TRIGLYCERIDE OVER 400 mg/dL  - 46 -   LDLDIRECT 0 - 99 mg/dL - 141.7(H) - - -   HDL >40 mg/dL - 30(L) - 40(L) -  Trlycerides <150 mg/dL - 402(H) - 149 -   Hemoglobin A1c 4.8 - 5.6 % - 10.8(H) 7.7(H) - 8.5(H)   HCO3 20.0 - 28.0 mmol/L 23.1 - - - -   ACIDBASEDEF 0.0 - 2.0 mmol/L 1.8 - - - -       Pulmonary Assessment Scores:  Pulmonary Assessment Scores    Row Name 04/22/20 1335         ADL UCSD   ADL Phase Entry     SOB Score total 51     Rest 0     Walk 2     Stairs 4     Bath 0     Dress 0     Shop 5       CAT Score   CAT Score 18       mMRC Score   mMRC Score 3            UCSD: Self-administered rating of dyspnea associated with activities of daily living (ADLs) 6-point scale (0 = "not at all" to 5 = "maximal or unable to do because of breathlessness")  Scoring Scores range from 0 to 120.  Minimally important difference is 5 units  CAT: CAT can identify the health impairment of COPD patients  and is better correlated with disease progression.  CAT has a scoring range of zero to 40. The CAT score is classified into four groups of low (less than 10), medium (10 - 20), high (21-30) and very high (31-40) based on the impact level of disease on health status. A CAT score over 10 suggests significant symptoms.  A worsening CAT score could be explained by an exacerbation, poor medication adherence, poor inhaler technique, or progression of COPD or comorbid conditions.  CAT MCID is 2 points  mMRC: mMRC (Modified Medical Research Council) Dyspnea Scale is used to assess the degree of baseline functional disability in patients of respiratory disease due to dyspnea. No minimal important difference is established. A decrease in score of 1 point or greater is considered a positive change.   Pulmonary Function Assessment:   Exercise Target Goals: Exercise Program Goal: Individual exercise prescription set using results from initial 6 min walk test and THRR while considering  patient's activity barriers and safety.   Exercise Prescription Goal: Initial exercise prescription builds to 30-45 minutes a day of aerobic activity, 2-3 days per week.  Home exercise guidelines will be given to patient during program as part of exercise prescription that the participant will acknowledge.  Education: Aerobic Exercise & Resistance Training: - Gives group verbal and written instruction on the various components of exercise. Focuses on aerobic and resistive training programs and the benefits of this training and how to safely progress through these programs..   Education: Exercise & Equipment Safety: - Individual verbal instruction and demonstration of equipment use and safety with use of the equipment.   Pulmonary Rehab from 04/22/2020 in [facility] Cardiac and Pulmonary Rehab  Date 04/22/20  Educator [facility]  Instruction Review Code 1- Verbalizes Understanding      Education: Exercise Physiology & General  Exercise Guidelines: - Group verbal and written instruction with models to review the exercise physiology of the cardiovascular system and associated critical values. Provides general exercise guidelines with specific guidelines to those with heart or lung disease.    Education: Flexibility, Balance, Mind/Body Relaxation: Provides group verbal/written instruction on the benefits of flexibility and balance training, including mind/body exercise modes such as yoga, pilates and tai chi.  Demonstration  and skill practice provided.   Activity Barriers & Risk Stratification:  Activity Barriers & Cardiac Risk Stratification - 04/22/20 1336      Activity Barriers & Cardiac Risk Stratification   Activity Barriers Back Problems;Other (comment);Balance Concerns;Muscular Weakness;Deconditioning;Shortness of Breath;History of Falls    Comments GOUT, Vision Loss, painful to walk, just had crystals reset    Cardiac Risk Stratification High           6 Minute Walk:  6 Minute Walk    Row Name 04/22/20 1330         6 Minute Walk   Phase Initial     Distance 595 feet     Walk Time 4.72 minutes     # of Rest Breaks 2  38 sec, 49 sec then stopped     MPH 1.43     METS 1.46     RPE 17     Perceived Dyspnea  3     VO2 Peak 5.11     Symptoms Yes (comment)     Comments back pain 10/10, SOB, left sided weakness     Resting HR 55 bpm     Resting BP 146/74     Resting Oxygen Saturation  98 %     Exercise Oxygen Saturation  during 6 min walk 98 %     Max Ex. HR 69 bpm     Max Ex. BP 154/64     2 Minute Post BP 148/72       Interval HR   1 Minute HR 69     2 Minute HR 69     3 Minute HR 63     4 Minute HR 65     2 Minute Post HR 59     Interval Heart Rate? Yes       Interval Oxygen   Interval Oxygen? Yes     Baseline Oxygen Saturation % 98 %     1 Minute Oxygen Saturation % 99 %     1 Minute Liters of Oxygen 0 L  Room Air     2 Minute Oxygen Saturation % 98 %     2 Minute Liters of  Oxygen 0 L     3 Minute Oxygen Saturation % 98 %     3 Minute Liters of Oxygen 0 L     4 Minute Oxygen Saturation % 98 %     4 Minute Liters of Oxygen 0 L     5 Minute Oxygen Saturation % 98 %     5 Minute Liters of Oxygen 0 L     2 Minute Post Oxygen Saturation % 98 %     2 Minute Post Liters of Oxygen 0 L           Oxygen Initial Assessment:  Oxygen Initial Assessment - 04/08/20 1423      Home Oxygen   Home Oxygen Device None    Sleep Oxygen Prescription None    Home Exercise Oxygen Prescription None    Home at Rest Exercise Oxygen Prescription None      Initial 6 min Walk   Oxygen Used None      Program Oxygen Prescription   Program Oxygen Prescription None      Intervention   Short Term Goals To learn and understand importance of monitoring SPO2 with pulse oximeter and demonstrate accurate use of the pulse oximeter.;To learn and understand importance of maintaining oxygen saturations>88%;To learn and demonstrate proper pursed lip breathing  techniques or other breathing techniques.    Long  Term Goals Verbalizes importance of monitoring SPO2 with pulse oximeter and return demonstration;Maintenance of O2 saturations>88%;Exhibits proper breathing techniques, such as pursed lip breathing or other method taught during program session           Oxygen Re-Evaluation:   Oxygen Discharge (Final Oxygen Re-Evaluation):   Initial Exercise Prescription:  Initial Exercise Prescription - 04/22/20 1300      Date of Initial Exercise RX and Referring Provider   Date 04/22/20    Referring Provider Felipa Furnace MD      Treadmill   MPH 0.5    Grade 0    Minutes 15    METs 1.4      NuStep   Level 1    SPM 80    Minutes 15    METs 1.5      T5 Nustep   Level 1    SPM 80    Minutes 15    METs 1.5      Biostep-RELP   Level 1    SPM 50    Minutes 15    METs 1      Prescription Details   Frequency (times per week) 2    Duration Progress to 30 minutes of  continuous aerobic without signs/symptoms of physical distress      Intensity   THRR 40-80% of Max Heartrate 98-141    Ratings of Perceived Exertion 11-13    Perceived Dyspnea 0-4      Progression   Progression Continue to progress workloads to maintain intensity without signs/symptoms of physical distress.      Resistance Training   Training Prescription Yes    Weight 3 lb    Reps 10-15           Perform Capillary Blood Glucose checks as needed.  Exercise Prescription Changes:  Exercise Prescription Changes    Row Name 04/22/20 1300             Response to Exercise   Blood Pressure (Admit) 146/74       Blood Pressure (Exercise) 154/64       Blood Pressure (Exit) 142/80       Heart Rate (Admit) 55 bpm       Heart Rate (Exercise) 69 bpm       Heart Rate (Exit) 58 bpm       Oxygen Saturation (Admit) 98 %       Oxygen Saturation (Exercise) 98 %       Oxygen Saturation (Exit) 98 %       Rating of Perceived Exertion (Exercise) 17       Perceived Dyspnea (Exercise) 3       Symptoms back pain 10/10, left sided weakness, SOB       Comments walk test results              Exercise Comments:   Exercise Goals and Review:  Exercise Goals    Row Name 04/22/20 1339             Exercise Goals   Increase Physical Activity Yes       Intervention Provide advice, education, support and counseling about physical activity/exercise needs.;Develop an individualized exercise prescription for aerobic and resistive training based on initial evaluation findings, risk stratification, comorbidities and participant's personal goals.       Expected Outcomes Short Term: Attend rehab on a regular basis to increase amount of physical activity.;Long Term: Add in  home exercise to make exercise part of routine and to increase amount of physical activity.;Long Term: Exercising regularly at least 3-5 days a week.       Increase Strength and Stamina Yes       Intervention Provide advice,  education, support and counseling about physical activity/exercise needs.;Develop an individualized exercise prescription for aerobic and resistive training based on initial evaluation findings, risk stratification, comorbidities and participant's personal goals.       Expected Outcomes Short Term: Increase workloads from initial exercise prescription for resistance, speed, and METs.;Short Term: Perform resistance training exercises routinely during rehab and add in resistance training at home;Long Term: Improve cardiorespiratory fitness, muscular endurance and strength as measured by increased METs and functional capacity (6MWT)       Able to understand and use rate of perceived exertion (RPE) scale Yes       Intervention Provide education and explanation on how to use RPE scale       Expected Outcomes Short Term: Able to use RPE daily in rehab to express subjective intensity level;Long Term:  Able to use RPE to guide intensity level when exercising independently       Able to understand and use Dyspnea scale Yes       Intervention Provide education and explanation on how to use Dyspnea scale       Expected Outcomes Short Term: Able to use Dyspnea scale daily in rehab to express subjective sense of shortness of breath during exertion;Long Term: Able to use Dyspnea scale to guide intensity level when exercising independently       Knowledge and understanding of Target Heart Rate Range (THRR) Yes       Intervention Provide education and explanation of THRR including how the numbers were predicted and where they are located for reference       Expected Outcomes Short Term: Able to state/look up THRR;Short Term: Able to use daily as guideline for intensity in rehab;Long Term: Able to use THRR to govern intensity when exercising independently       Able to check pulse independently Yes       Intervention Provide education and demonstration on how to check pulse in carotid and radial arteries.;Review the  importance of being able to check your own pulse for safety during independent exercise       Expected Outcomes Short Term: Able to explain why pulse checking is important during independent exercise;Long Term: Able to check pulse independently and accurately       Understanding of Exercise Prescription Yes       Intervention Provide education, explanation, and written materials on patient's individual exercise prescription       Expected Outcomes Short Term: Able to explain program exercise prescription;Long Term: Able to explain home exercise prescription to exercise independently              Exercise Goals Re-Evaluation :  Exercise Goals Re-Evaluation    Row Name 05/13/20 1607 05/29/20 1236 06/09/20 1417         Exercise Goal Re-Evaluation   Comments Pt has not started exercise sessions yet Pt has not started exercise sessions yet Pt has not started exercise sessions yet            Discharge Exercise Prescription (Final Exercise Prescription Changes):  Exercise Prescription Changes - 04/22/20 1300      Response to Exercise   Blood Pressure (Admit) 146/74    Blood Pressure (Exercise) 154/64    Blood Pressure (Exit)  142/80    Heart Rate (Admit) 55 bpm    Heart Rate (Exercise) 69 bpm    Heart Rate (Exit) 58 bpm    Oxygen Saturation (Admit) 98 %    Oxygen Saturation (Exercise) 98 %    Oxygen Saturation (Exit) 98 %    Rating of Perceived Exertion (Exercise) 17    Perceived Dyspnea (Exercise) 3    Symptoms back pain 10/10, left sided weakness, SOB    Comments walk test results           Nutrition:  Target Goals: Understanding of nutrition guidelines, daily intake of sodium <1572m, cholesterol <2070m calories 30% from fat and 7% or less from saturated fats, daily to have 5 or more servings of fruits and vegetables.  Education: Controlling Sodium/Reading Food Labels -Group verbal and written material supporting the discussion of sodium use in heart healthy nutrition.  Review and explanation with models, verbal and written materials for utilization of the food label.   Education: General Nutrition Guidelines/Fats and Fiber: -Group instruction provided by verbal, written material, models and posters to present the general guidelines for heart healthy nutrition. Gives an explanation and review of dietary fats and fiber.   Biometrics:  Pre Biometrics - 04/22/20 1333      Pre Biometrics   Height 5' 7.2" (1.707 m)    Weight 251 lb 14.4 oz (114.3 kg)    BMI (Calculated) 39.21    Single Leg Stand 0 seconds            Nutrition Therapy Plan and Nutrition Goals:   Nutrition Assessments:  Nutrition Assessments - 04/22/20 1333      MEDFICTS Scores   Pre Score 27           MEDIFICTS Score Key:          ?70 Need to make dietary changes          40-70 Heart Healthy Diet         ? 40 Therapeutic Level Cholesterol Diet  Nutrition Goals Re-Evaluation:   Nutrition Goals Discharge (Final Nutrition Goals Re-Evaluation):   Psychosocial: Target Goals: Acknowledge presence or absence of significant depression and/or stress, maximize coping skills, provide positive support system. Participant is able to verbalize types and ability to use techniques and skills needed for reducing stress and depression.   Education: Depression - Provides group verbal and written instruction on the correlation between heart/lung disease and depressed mood, treatment options, and the stigmas associated with seeking treatment.   Education: Sleep Hygiene -Provides group verbal and written instruction about how sleep can affect your health.  Define sleep hygiene, discuss sleep cycles and impact of sleep habits. Review good sleep hygiene tips.    Education: Stress and Anxiety: - Provides group verbal and written instruction about the health risks of elevated stress and causes of high stress.  Discuss the correlation between heart/lung disease and anxiety and treatment  options. Review healthy ways to manage with stress and anxiety.   [facility]  Date 03/23[facility]17  [facility] Review Code (retired) 2- meets goals/outcomes      Initial Review & Psychosocial Screening:  Initial Psych Review & Screening - 04/08/20 1414      Initial Review   Current issues with Current Sleep Concerns    Source of Stress Concerns Chronic Illness    Comments Not able to sleep straight throught, but gets at least 6hrs      Family  Dynamics   Good Support System? Yes   husband     Barriers   Psychosocial barriers to participate in program There are no identifiable barriers or psychosocial needs.;The patient should benefit from training in stress management and relaxation.      Screening Interventions   Interventions Encouraged to exercise;To provide support and resources with identified psychosocial needs    Expected Outcomes Short Term goal: Utilizing psychosocial counselor, staff and physician to assist with identification of specific Stressors or current issues interfering with healing process. Setting desired goal for each stressor or current issue identified.;Long Term Goal: Stressors or current issues are controlled or eliminated.;Short Term goal: Identification and review with participant of any Quality of Life or Depression concerns found by scoring the questionnaire.;Long Term goal: The participant improves quality of Life and PHQ9 Scores as seen by post scores and/or verbalization of changes           Quality of Life Scores:  Scores of 19 and below usually indicate a poorer quality of life in these areas.  A difference of  2-3 points is a clinically meaningful difference.  A difference of 2-3 points in the total score of the Quality of Life Index has been associated with significant improvement in overall quality of life, self-image, physical symptoms, and general health in studies assessing change in  quality of life.  PHQ-9: Recent Review Flowsheet Data    Depression screen [facility] 2/9 04/22/2020 03/14/2017   Decreased Interest 0 0   Down, Depressed, Hopeless 0 0   PHQ - 2 Score 0 0   Altered sleeping 1 2    Tired, decreased energy 2 3    Change in appetite 0 0   Feeling bad or failure about yourself  0 0   Trouble concentrating 0 0   Moving slowly or fidgety/restless 0 0   Suicidal thoughts 0 0   PHQ-9 Score 3 5   Difficult doing work/chores Somewhat difficult Somewhat difficult     Interpretation of Total Score  Total Score Depression Severity:  1-4 = Minimal depression, 5-9 = Mild depression, 10-14 = Moderate depression, 15-19 = Moderately severe depression, 20-27 = Severe depression   Psychosocial Evaluation and Intervention:  Psychosocial Evaluation - 04/08/20 1417      Psychosocial Evaluation & Interventions   Interventions Encouraged to exercise with the program and follow exercise prescription    Comments Corretta is returning to rehab for heart failure.  She has a great support system in her husband.  She has a lot of chronic issues that she has been dealing with continuously.  She was just discharge from hospital from a second stroke. Her strokes have left her with a visual and hearing impairment.  Overall is does well mentally.  She does not sleep well continuously but gets at least 6 hours total each day.  She is hoping to build back her strength and stamina and improve her endurance.  She wants to be able to move better overall.    Expected Outcomes Short: Attend rehab regularly to build stamina  Long: Continue to be able to do more and feel better    Continue Psychosocial Services  Follow up required by staff           Psychosocial Re-Evaluation:   Psychosocial Discharge (Final Psychosocial Re-Evaluation):   Education: Education Goals: Education classes will be provided on a weekly basis, covering required topics. Participant will state understanding/return  demonstration of topics presented.  Learning Barriers/Preferences:  Learning Barriers/Preferences -  04/08/20 1414      Learning Barriers/Preferences   Learning Barriers Sight;Hearing   impaired vision from stroke, and left sided hearing impairment   Learning Preferences Audio;Verbal Instruction           General Pulmonary Education Topics:  Infection Prevention: - Provides verbal and written material to individual with discussion of infection control including proper hand washing and proper equipment cleaning during exercise session.   Pulmonary Rehab from 04/22/2020 in [facility] Cardiac and Pulmonary Rehab  Date 04/22/20  Educator [facility]  Instruction Review Code 1- Verbalizes Understanding      Falls Prevention: - Provides verbal and written material to individual with discussion of falls prevention and safety.   Pulmonary Rehab from 04/22/2020 in [facility] Cardiac and Pulmonary Rehab  Date 04/22/20  Educator [facility]  Instruction Review Code 1- Verbalizes Understanding      Chronic Lung Diseases: - Group verbal and written instruction to review updates, respiratory medications, advancements in procedures and treatments. Discuss use of supplemental oxygen including available portable oxygen systems, continuous and intermittent flow rates, concentrators, personal use and safety guidelines. Review proper use of inhaler and spacers. Provide informative websites for self-education.    Energy Conservation: - Provide group verbal and written instruction for methods to conserve energy, plan and organize activities. Instruct on pacing techniques, use of adaptive equipment and posture/positioning to relieve shortness of breath.   Triggers and Exacerbations: - Group verbal and written instruction to review types of environmental triggers and ways to prevent exacerbations. Discuss weather changes, air quality and the benefits of nasal washing. Review warning signs and symptoms to help prevent infections.  Discuss techniques for effective airway clearance, coughing, and vibrations.   AED/CPR: - Group verbal and written instruction with the use of models to demonstrate the basic use of the AED with the basic ABC's of resuscitation.   Anatomy and Physiology of the Lungs: - Group verbal and written instruction with the use of models to provide basic lung anatomy and physiology related to function, structure and complications of lung disease.   Anatomy & Physiology of the Heart: - Group verbal and written instruction and models provide basic cardiac anatomy and physiology, with the coronary electrical and arterial systems. Review of Valvular disease and Heart Failure   Cardiac Rehab from 03/23/2017 in [facility] Cardiac and Pulmonary Rehab  Date 03/21/17  Educator CE  Instruction Review Code (retired) 2- meets goals/outcomes      Cardiac Medications: - Group verbal and written instruction to review commonly prescribed medications for heart disease. Reviews the medication, class of the drug, and side effects.   Other: -Provides group and verbal instruction on various topics (see comments)   Knowledge Questionnaire Score:  Knowledge Questionnaire Score - 04/22/20 1333      Knowledge Questionnaire Score   Pre Score 16/18 Education Focus: O2 safety            Core Components/Risk Factors/Patient Goals at Admission:  Personal Goals and Risk Factors at Admission - 04/22/20 1334      Core Components/Risk Factors/Patient Goals on Admission    Weight Management Yes;Weight Loss;Obesity    Intervention Weight Management: Develop a combined nutrition and exercise program designed to reach desired caloric intake, while maintaining appropriate intake of nutrient and fiber, sodium and fats, and appropriate energy expenditure required for the weight goal.;Weight Management: Provide education and appropriate resources to help participant work on and attain dietary goals.;Weight Management/Obesity:  Establish reasonable short term and long term weight goals.;Obesity: Provide education  and appropriate resources to help participant work on and attain dietary goals.    Admit Weight 251 lb 14.4 oz (114.3 kg)    Goal Weight: Short Term 246 lb (111.6 kg)    Goal Weight: Long Term 240 lb (108.9 kg)    Expected Outcomes Short Term: Continue to assess and modify interventions until short term weight is achieved;Long Term: Adherence to nutrition and physical activity/exercise program aimed toward attainment of established weight goal;Weight Loss: Understanding of general recommendations for a balanced deficit meal plan, which promotes 1-2 lb weight loss per week and includes a negative energy balance of [phone] kcal/d;Understanding recommendations for meals to include 15-35% energy as protein, 25-35% energy from fat, 35-60% energy from carbohydrates, less than 232m of dietary cholesterol, 20-35 gm of total fiber daily;Understanding of distribution of calorie intake throughout the day with the consumption of 4-5 meals/snacks    Improve shortness of breath with ADL's Yes    Intervention Provide education, individualized exercise plan and daily activity instruction to help decrease symptoms of SOB with activities of daily living.    Expected Outcomes Short Term: Improve cardiorespiratory fitness to achieve a reduction of symptoms when performing ADLs;Long Term: Be able to perform more ADLs without symptoms or delay the onset of symptoms    Diabetes Yes    Intervention Provide education about signs/symptoms and action to take for hypo/hyperglycemia.;Provide education about proper nutrition, including hydration, and aerobic/resistive exercise prescription along with prescribed medications to achieve blood glucose in normal ranges: Fasting glucose 65-99 mg/dL    Expected Outcomes Short Term: Participant verbalizes understanding of the signs/symptoms and immediate care of hyper/hypoglycemia, proper foot care and  importance of medication, aerobic/resistive exercise and nutrition plan for blood glucose control.;Long Term: Attainment of HbA1C < 7%.    Heart Failure Yes    Intervention Provide a combined exercise and nutrition program that is supplemented with education, support and counseling about heart failure. Directed toward relieving symptoms such as shortness of breath, decreased exercise tolerance, and extremity edema.    Expected Outcomes Improve functional capacity of life;Short term: Attendance in program 2-3 days a week with increased exercise capacity. Reported lower sodium intake. Reported increased fruit and vegetable intake. Reports medication compliance.;Long term: Adoption of self-care skills and reduction of barriers for early signs and symptoms recognition and intervention leading to self-care maintenance.;Short term: Daily weights obtained and reported for increase. Utilizing diuretic protocols set by physician.    Hypertension Yes    Intervention Provide education on lifestyle modifcations including regular physical activity/exercise, weight management, moderate sodium restriction and increased consumption of fresh fruit, vegetables, and low fat dairy, alcohol moderation, and smoking cessation.;Monitor prescription use compliance.    Expected Outcomes Short Term: Continued assessment and intervention until BP is < 140/969mHG in hypertensive participants. < 130/8051mG in hypertensive participants with diabetes, heart failure or chronic kidney disease.;Long Term: Maintenance of blood pressure at goal levels.    Lipids Yes    Intervention Provide education and support for participant on nutrition & aerobic/resistive exercise along with prescribed medications to achieve LDL <59m11mDL >40mg101m Expected Outcomes Short Term: Participant states understanding of desired cholesterol values and is compliant with medications prescribed. Participant is following exercise prescription and nutrition  guidelines.;Long Term: Cholesterol controlled with medications as prescribed, with individualized exercise RX and with personalized nutrition plan. Value goals: LDL < 59mg,78m > 40 mg.           Education:Diabetes - Individual verbal and written  instruction to review signs/symptoms of diabetes, desired ranges of glucose level fasting, after meals and with exercise. Acknowledge that pre and post exercise glucose checks will be done for 3 sessions at entry of program.   Pulmonary Rehab from 04/22/2020 in [facility]  Date 04/22/20  Educator [facility]  Instruction Review Code 1- Verbalizes Understanding      Education: Know Your Numbers and Risk Factors: -Group verbal and written instruction about important numbers in your health.  Discussion of what are risk factors and how they play a role in the disease process.  Review of Cholesterol, Blood Pressure, Diabetes, and BMI and the role they play in your overall health.   Core Components/Risk Factors/Patient Goals Review:    Core Components/Risk Factors/Patient Goals at Discharge (Final Review):    ITP Comments:  ITP Comments    Row Name 04/08/20 1426 04/22/20 1330 05/05/20 1045 05/07/20 0614 05/13/20 1607   ITP Comments Completed virtual orientation today.  EP evaluation is scheduled for Monday 4/26 at 3pm.  Documentation for diagnosis can be found in [facility] encounter 03/18/20. Completed 6MWT and gym orientation.  Initial ITP created and sent for review to Dr. Emily Filbert, Medical Director. Michelina left [place] a message yesterday.  Returned call today, left message.  She would like to delay her start in the program until June.  Her husband is still having to go back and forth to [facility] daily for treatments which makes it very hard for her to get here regularly since he is her means of transportation.  We will hold her start until June. 30 Day review completed. ITP review done, changes made as directed,and approval shown by signature of   Scientist, research (life sciences). Still waiting to start next month.   [place] Name 05/29/20 1236 06/04/20 0558 06/09/20 1417 06/24/20 1215     ITP Comments Called to check on pt now that it is June per her request.  She was in hospital last week for AKI and now discussing peritoneal dialysis.  Left message. 30 Day review completed. Medical Director ITP review done, changes made as directed, and signed approval by Medical Director. Called to check on patient for when to start.  Left message.  Will need to hear back or will discharge on 7/2. Tanice was not able to start immediately due to her husband and transportation.  She has not returned our calls to get started.  We will discharge her at his time.           Comments: Discharge ITP

## 2020-06-26 ENCOUNTER — Ambulatory Visit: Payer: Medicare Other

## 2020-06-30 ENCOUNTER — Telehealth (HOSPITAL_COMMUNITY): Payer: Self-pay

## 2020-06-30 NOTE — Telephone Encounter (Signed)
Attempted to contact, no answer left message.  Trying to schedule a home visit.  Will continue to try.   [place] [phone]

## 2020-07-01 ENCOUNTER — Ambulatory Visit: Payer: Medicare Other

## 2020-07-03 ENCOUNTER — Ambulatory Visit: Payer: Medicare Other

## 2020-07-08 ENCOUNTER — Ambulatory Visit: Payer: Medicare Other

## 2020-07-10 ENCOUNTER — Ambulatory Visit: Payer: Medicare Other

## 2020-07-10 ENCOUNTER — Telehealth (HOSPITAL_COMMUNITY): Payer: Self-pay

## 2020-07-10 NOTE — Telephone Encounter (Signed)
Was able to get in touch with Sharon Arias.  Made a home appt for Aug 10 at 11:00am.  She will be on vacation for a week coming up.  Will visit for heart failure.   [place] [phone]

## 2020-07-15 ENCOUNTER — Ambulatory Visit: Payer: Medicare Other

## 2020-07-17 ENCOUNTER — Ambulatory Visit: Payer: Medicare Other

## 2020-07-22 ENCOUNTER — Ambulatory Visit: Payer: Medicare Other

## 2020-07-24 ENCOUNTER — Ambulatory Visit: Payer: Medicare Other

## 2020-07-29 ENCOUNTER — Telehealth: Payer: Self-pay | Admitting: Family

## 2020-07-29 ENCOUNTER — Other Ambulatory Visit (HOSPITAL_COMMUNITY): Payer: Self-pay

## 2020-07-29 ENCOUNTER — Encounter (HOSPITAL_COMMUNITY): Payer: Self-pay

## 2020-07-29 ENCOUNTER — Ambulatory Visit: Payer: Medicare Other

## 2020-07-29 DIAGNOSIS — I5032 Chronic diastolic (congestive) heart failure: Secondary | ICD-10-CM

## 2020-07-29 NOTE — Telephone Encounter (Signed)
Kristi from the paramedic program is currently at patient's home. She says that patient was recently hospitalized at a hospital in [facility] due to fluid overload. Says that she was given some IV lasix with increase in furosemide to daily but without any potassium. Patient is experiencing some cramping and is concerned about this.   She is agreeable to coming in for [facility] tomorrow to evaluate renal function and potassium level.

## 2020-07-29 NOTE — Progress Notes (Signed)
Today had first home visit with [name].  She recently had a vacation to [facility].  She states she began to gain fluid and experienced shortness of breath so she went to hospital.  They gave IV lasix and 1 day admittance.  She has seen her PCP since she has been home and they changed her furosemide to 20 mg daily.  She has been taking this.  Her complaint to day is cramps in legs, she is wandering if too dry or potassium is low.  She takes no potassium daily.  Contacted Tina with HF clinic and she switched her appt to earlier date and ordered her to get blood work done tomorrow.  She will advise her once blood work is back.  She states her breathing is good today.  She denies extra fluid in her legs, she has support hose on today.  She states wears them daily.  She states when she retains fluid it is in her abdominal area.  She states does not feel tight today.  Denies chest pain, headaches or dizziness.  Discussed fluid and high sodium foods.  She states she drinks about 60 ozs a day.  She watches high sodium foods.  Lungs are clear.  She has all her medications and are affordable.  She has everything for everyday living.  She lives with her husband.  He has some medical problems but doing well right now.  She is aware of up coming appts.  She states not weighing because she can not read the scale.  Asked her if her husband could read it for her and she starts keeping a record of it.  Explained to her importance of weighing daily.  She states she will start tomorrow.  Explained the program and she is willing to be part of it.  She has my number and aware of my schedule.  Will visit for heart failure.   [place] [phone]

## 2020-07-30 ENCOUNTER — Encounter
Admission: RE | Admit: 2020-07-30 | Discharge: 2020-07-30 | Disposition: A | Discharge status: Home / Self Care | Payer: Medicare Other | Source: Ambulatory Visit | Attending: Family | Admitting: Family

## 2020-07-30 ENCOUNTER — Other Ambulatory Visit: Payer: Self-pay

## 2020-07-30 DIAGNOSIS — Z01812 Encounter for preprocedural laboratory examination: Secondary | ICD-10-CM | POA: Insufficient documentation

## 2020-07-30 DIAGNOSIS — I5032 Chronic diastolic (congestive) heart failure: Secondary | ICD-10-CM | POA: Diagnosis not present

## 2020-07-30 LAB — BASIC METABOLIC PANEL
Anion gap: 13 (ref 5–15)
BUN: 57 mg/dL — ABNORMAL HIGH (ref 6–20)
CO2: 23 mmol/L (ref 22–32)
Calcium: 9.4 mg/dL (ref 8.9–10.3)
Chloride: 106 mmol/L (ref 98–111)
Creatinine, Ser: 3.12 mg/dL — ABNORMAL HIGH (ref 0.44–1.00)
GFR calc Af Amer: 18 mL/min — ABNORMAL LOW (ref >60)
GFR calc non Af Amer: 16 mL/min — ABNORMAL LOW (ref >60)
Glucose, Bld: 218 mg/dL — ABNORMAL HIGH (ref 70–99)
Potassium: 3.9 mmol/L (ref 3.5–5.1)
Sodium: 142 mmol/L (ref 135–145)

## 2020-07-31 ENCOUNTER — Ambulatory Visit: Payer: Medicare Other

## 2020-07-31 ENCOUNTER — Other Ambulatory Visit: Payer: Self-pay

## 2020-07-31 ENCOUNTER — Encounter: Payer: Self-pay | Admitting: Internal Medicine

## 2020-07-31 NOTE — Progress Notes (Signed)
Patient being referred back to the cancer center for hem work up. Referred back by Dr, Juleen China. Patient has been evaluted by Dr. Alvy Bimler and Kristen Cardinal in the past in 2017. She is legally blind. Her family will bring her to the appointments tomorrow.

## 2020-08-01 ENCOUNTER — Inpatient Hospital Stay: Payer: Medicare Other

## 2020-08-01 ENCOUNTER — Inpatient Hospital Stay: Payer: Medicare Other | Attending: Internal Medicine | Admitting: Internal Medicine

## 2020-08-01 DIAGNOSIS — D631 Anemia in chronic kidney disease: Secondary | ICD-10-CM

## 2020-08-01 DIAGNOSIS — I509 Heart failure, unspecified: Secondary | ICD-10-CM

## 2020-08-01 DIAGNOSIS — E1122 Type 2 diabetes mellitus with diabetic chronic kidney disease: Secondary | ICD-10-CM | POA: Diagnosis not present

## 2020-08-01 DIAGNOSIS — N184 Chronic kidney disease, stage 4 (severe): Secondary | ICD-10-CM

## 2020-08-01 DIAGNOSIS — I130 Hypertensive heart and chronic kidney disease with heart failure and stage 1 through stage 4 chronic kidney disease, or unspecified chronic kidney disease: Secondary | ICD-10-CM | POA: Diagnosis present

## 2020-08-01 LAB — CBC WITH DIFFERENTIAL/PLATELET
Abs Immature Granulocytes: 0.09 10*3/uL — ABNORMAL HIGH (ref 0.00–0.07)
Basophils Absolute: 0.1 10*3/uL (ref 0.0–0.1)
Basophils Relative: 1 %
Eosinophils Absolute: 0.3 10*3/uL (ref 0.0–0.5)
Eosinophils Relative: 3 %
HCT: 27.1 % — ABNORMAL LOW (ref 36.0–46.0)
Hemoglobin: 9.1 g/dL — ABNORMAL LOW (ref 12.0–15.0)
Immature Granulocytes: 1 %
Lymphocytes Relative: 19 %
Lymphs Abs: 2.1 10*3/uL (ref 0.7–4.0)
MCH: 28.7 pg (ref 26.0–34.0)
MCHC: 33.6 g/dL (ref 30.0–36.0)
MCV: 85.5 fL (ref 80.0–100.0)
Monocytes Absolute: 1 10*3/uL (ref 0.1–1.0)
Monocytes Relative: 9 %
Neutro Abs: 7.2 10*3/uL (ref 1.7–7.7)
Neutrophils Relative %: 67 %
Platelets: 293 10*3/uL (ref 150–400)
RBC: 3.17 MIL/uL — ABNORMAL LOW (ref 3.87–5.11)
RDW: 14.9 % (ref 11.5–15.5)
WBC: 10.9 10*3/uL — ABNORMAL HIGH (ref 4.0–10.5)
nRBC: 0 % (ref 0.0–0.2)

## 2020-08-01 LAB — BASIC METABOLIC PANEL
Anion gap: 14 (ref 5–15)
BUN: 60 mg/dL — ABNORMAL HIGH (ref 6–20)
CO2: 21 mmol/L — ABNORMAL LOW (ref 22–32)
Calcium: 9.2 mg/dL (ref 8.9–10.3)
Chloride: 104 mmol/L (ref 98–111)
Creatinine, Ser: 3.13 mg/dL — ABNORMAL HIGH (ref 0.44–1.00)
GFR calc Af Amer: 18 mL/min — ABNORMAL LOW (ref >60)
GFR calc non Af Amer: 16 mL/min — ABNORMAL LOW (ref >60)
Glucose, Bld: 159 mg/dL — ABNORMAL HIGH (ref 70–99)
Potassium: 4.1 mmol/L (ref 3.5–5.1)
Sodium: 139 mmol/L (ref 135–145)

## 2020-08-01 LAB — LACTATE DEHYDROGENASE: LDH: 195 U/L — ABNORMAL HIGH (ref 98–192)

## 2020-08-01 LAB — VITAMIN B12: Vitamin B-12: 355 pg/mL (ref 180–914)

## 2020-08-01 LAB — FOLATE: Folate: 9.4 ng/mL (ref >5.9)

## 2020-08-01 NOTE — Assessment & Plan Note (Addendum)
#  Severe anemia hemoglobin 7-8 [June 2021 iron saturation 18%]-symptomatic iron deficiency/secondary CKD-IV.   #I would recommend proceeding with Feraheme infusions weekly x4; will plan to start p.o. iron after infusions.  Discussed the potential acute infusion reactions with IV iron; which are quite rare.  Patient understands the risk; will proceed with infusions.   # Discussed use of erythropoietin stimulating agents like Aranesp/Retacrit to stimulate the bone marrow.  Discussed the potential issues with erythropoietin estimating agents-given the risk of stroke thromboembolic events/elevated blood pressure.  However, most of the serious events did not happen when the goal hematocrit is 33/hemoglobin 30.   #CKD stage IV-secondary diabetes hypertension-followed by Dr. Juleen China.  #CHF-chronic compensated-on diuretics.  Thank you Dr.Kolluru for allowing me to participate in the care of your pleasant patient. Please do not hesitate to contact me with questions or concerns in the interim.   # DISPOSITION:. # labs today- order cbc/bmp/b12/folic acid/LDH # Kirtland Bouchard weekly x4- start next week # follow up in 6 week- MD; labs- cbc/bmp;Ferrahem- Dr.B

## 2020-08-01 NOTE — Progress Notes (Signed)
[facility] NOTE  Patient Care Team: Idelle Crouch, MD as PCP - General (Internal Medicine)  CHIEF COMPLAINTS/PURPOSE OF CONSULTATION: Anemia  HEMATOLOGY HISTORY  # ANEMIA SEC to CKD- stage IV;  EGD-none-; 2017 FEB-colonoscopy [Dr.Skulskie]- ; capsule/ Bone marrow Biopsy-None; June 2021 [June iron sat-17%; ferritin-253]  # CKD stage IV [Dr.Kolluru]; Not on HD/ CHF [Dr. Paraschos]; diabetes on Insulin.   HISTORY OF PRESENTING ILLNESS:  Sharon Arias 59 y.o.  female with a history of CHF/CKD stage IV diabetes hypertension has been referred to [place] for further evaluation/work-up for anemia.  Patient feels extremely fatigued.  She feels short of breath with minimal exertion.  She had a recent hospitalization-at [facility] for congestive heart failure/fluid overload.  Patient states her hemoglobin was around 8 at the time.  Patient received iron infusion x1.  No reactions.   Blood in stools: None Change in bowel habits- None Blood in urine: None Difficulty swallowing: None Abnormal weight loss: None Iron supplementation: None Prior Blood transfusions: None  Vaginal bleeding: None   Review of Systems  Constitutional: Positive for malaise/fatigue. Negative for chills, diaphoresis, fever and weight loss.  HENT: Negative for nosebleeds and sore throat.   Eyes: Negative for double vision.  Respiratory: Positive for shortness of breath. Negative for cough, hemoptysis, sputum production and wheezing.   Cardiovascular: Positive for leg swelling. Negative for chest pain, palpitations and orthopnea.  Gastrointestinal: Negative for abdominal pain, blood in stool, constipation, diarrhea, heartburn, melena, nausea and vomiting.  Genitourinary: Negative for dysuria, frequency and urgency.  Musculoskeletal: Negative for back pain and joint pain.  Skin: Negative.  Negative for itching and rash.  Neurological: Negative for dizziness, tingling, focal weakness, weakness  and headaches.  Endo/Heme/Allergies: Does not bruise/bleed easily.  Psychiatric/Behavioral: Negative for depression. The patient is not nervous/anxious and does not have insomnia.     MEDICAL HISTORY:  Past Medical History:  Diagnosis Date  . Anemia in chronic kidney disease 05/19/2016  . Blind   . CAD (coronary artery disease)   . CHF (congestive heart failure) ([place])   . Chicken pox   . Chronic kidney disease    per dr sparks  . CVA (cerebral vascular accident) ([place])   . Detached retina   . Diabetes mellitus without complication ([place])    type 2  . Diabetic neuropathy ([place])   . Diabetic retinopathy ([place])    legally blind  . Hyperlipidemia   . Hypertension   . Myocardial infarction ([place])   . Obesity   . PONV (postoperative nausea and vomiting)   . Sciatica of right side    going to see physiciatry    SURGICAL HISTORY: Past Surgical History:  Procedure Laterality Date  . COLONOSCOPY WITH PROPOFOL N/A 01/26/2016   Procedure: COLONOSCOPY WITH PROPOFOL;  Surgeon: Lollie Sails, MD;  Location: [facility] ENDOSCOPY;  Service: Endoscopy;  Laterality: N/A;  . COLONOSCOPY WITH PROPOFOL N/A 01/27/2016   Procedure: COLONOSCOPY WITH PROPOFOL;  Surgeon: Lollie Sails, MD;  Location: [facility] ENDOSCOPY;  Service: Endoscopy;  Laterality: N/A;  . CORONARY ARTERY BYPASS GRAFT  2013  . INCISION AND DRAINAGE     chest abscess  . INCISION AND DRAINAGE ABSCESS N/A 12/11/2018   Procedure: INCISION AND DRAINAGE PERINEAL;  Surgeon: Jules Husbands, MD;  Location: [facility] ORS;  Service: General;  Laterality: N/A;  . RETINAL LASER PROCEDURE      SOCIAL HISTORY: Social History   Socioeconomic History  . Marital status: Married  Spouse name: Not on file  . Number of children: Not on file  . Years of education: Not on file  . Highest education level: Not on file  Occupational History  . Not on file  Tobacco Use  . Smoking status: Never Smoker  . Smokeless tobacco: Never Used  Vaping Use  .  Vaping Use: Never used  Substance and Sexual Activity  . Alcohol use: No  . Drug use: No  . Sexual activity: Not Currently  Other Topics Concern  . Not on file  Social History Narrative  . Not on file   Social Determinants of Health   Financial Resource Strain:   . Difficulty of Paying Living Expenses:   Food Insecurity:   . Worried About Charity fundraiser in the Last Year:   . Arboriculturist in the Last Year:   Transportation Needs:   . Film/video editor (Medical):   [name] Lack of Transportation (Non-Medical):   Physical Activity:   . Days of Exercise per Week:   . Minutes of Exercise per Session:   Stress:   . Feeling of Stress :   Social Connections:   . Frequency of Communication with Friends and Family:   . Frequency of Social Gatherings with Friends and Family:   . Attends Religious Services:   . Active Member of Clubs or Organizations:   . Attends Archivist Meetings:   [name] Marital Status:   Intimate Partner Violence:   . Fear of Current or Ex-Partner:   . Emotionally Abused:   [name] Physically Abused:   . Sexually Abused:     FAMILY HISTORY: Family History  Problem Relation Age of Onset  . Breast cancer Mother   . Prostate cancer Father        we think mets to liver and bone    ALLERGIES:  is allergic to ozempic (0.25 or 0.5 mg-dose) [semaglutide(0.25 or 0.'5mg'$ -dos)] and trulicity [dulaglutide].  MEDICATIONS:  Current Outpatient Medications  Medication Sig Dispense Refill  . acetaminophen (TYLENOL) 500 MG tablet Take 1,000 mg by mouth every 8 (eight) hours as needed for pain.    [name] allopurinol (ZYLOPRIM) 300 MG tablet Take 1 tablet by mouth daily.    [name] amLODipine (NORVASC) 10 MG tablet Take 10 mg by mouth at bedtime.    [name] aspirin EC 81 MG tablet Take 81 mg by mouth every morning.    . clopidogrel (PLAVIX) 75 MG tablet Take 1 tablet (75 mg total) by mouth daily. 30 tablet 0  . furosemide (LASIX) 20 MG tablet Take 1 tablet (20 mg total) by mouth  every other day. (Patient taking differently: Take 20 mg by mouth daily. Her PCP changed to daily on 07/23/20) 30 tablet 0  . hydrALAZINE (APRESOLINE) 25 MG tablet Take 1 tablet (25 mg total) by mouth 2 (two) times daily at 10 AM and 5 PM. (Patient taking differently: Take 50 mg by mouth 2 (two) times daily at 10 AM and 5 PM. ) 60 tablet 0  . insulin glargine (LANTUS SOLOSTAR) 100 UNIT/ML Solostar Pen Inject 57 Units into the skin at bedtime. (Patient taking differently: Inject 70 Units into the skin at bedtime. ) 15 mL 11  . insulin lispro (HUMALOG) 100 UNIT/ML KwikPen Inject 15-30 Units into the skin See admin instructions. Pt. Uses 15 units/ml in the mornings, 30units/ml in the afternoon, and 40 units/ml at bedtime    . isosorbide mononitrate (IMDUR) 30 MG 24 hr tablet Take 1 tablet (  30 mg total) by mouth daily. 30 tablet 0  . metoprolol tartrate (LOPRESSOR) 50 MG tablet Take 50 mg by mouth 2 (two) times daily.     . rosuvastatin (CRESTOR) 20 MG tablet Take 20 mg by mouth at bedtime.      No current facility-administered medications for this visit.      PHYSICAL EXAMINATION:   Vitals:   08/01/20 1405  BP: (!) 131/55  Pulse: 66  Resp: 20  Temp: 97.9 F (36.6 C)   Filed Weights   08/01/20 1405  Weight: 255 lb (115.7 kg)    Physical Exam Constitutional:      Comments: Obese.  Alone.  Walk independently.  HENT:     Head: Normocephalic and atraumatic.     Mouth/Throat:     Pharynx: No oropharyngeal exudate.  Eyes:     Pupils: Pupils are equal, round, and reactive to light.  Cardiovascular:     Rate and Rhythm: Normal rate and regular rhythm.  Pulmonary:     Effort: Pulmonary effort is normal. No respiratory distress.     Breath sounds: Normal breath sounds. No wheezing.  Abdominal:     General: Bowel sounds are normal. There is no distension.     Palpations: Abdomen is soft. There is no mass.     Tenderness: There is no abdominal tenderness. There is no guarding or rebound.   Musculoskeletal:        General: No tenderness. Normal range of motion.     Cervical back: Normal range of motion and neck supple.  Skin:    General: Skin is warm.  Neurological:     Mental Status: She is alert and oriented to person, place, and time.  Psychiatric:        Mood and Affect: Affect normal.     LABORATORY DATA:  I have reviewed the data as listed Lab Results  Component Value Date   WBC 10.9 (H) 08/01/2020   HGB 9.1 (L) 08/01/2020   HCT 27.1 (L) 08/01/2020   MCV 85.5 08/01/2020   PLT 293 08/01/2020   Recent Labs    05/22/20 0427 05/22/20 0427 05/23/20 0522 05/24/20 0520 07/30/20 1351  NA 134*   < > 136 138 142  K 3.5   < > 3.6 3.7 3.9  CL 101   < > 99 102 106  CO2 22   < > 23 21* 23  GLUCOSE 260*   < > 235* 192* 218*  BUN 81*   < > 98* 98* 57*  CREATININE 4.01*   < > 3.89* 3.54* 3.12*  CALCIUM 8.9   < > 9.2 9.1 9.4  GFRNONAA 12*   < > 12* 13* 16*  GFRAA 13*   < > 14* 16* 18*  PROT 7.2  --  7.7 7.2  --   ALBUMIN 3.2*  --  3.5 3.3*  --   AST 25  --  20 17  --   ALT 34  --  34 29  --   ALKPHOS 61  --  73 68  --   BILITOT 0.6  --  0.8 0.6  --    < > = values in this interval not displayed.     No results found.  Anemia of chronic kidney failure, stage 4 (severe) (HCC) #Severe anemia hemoglobin 7-8 [June 2021 iron saturation 18%]-symptomatic iron deficiency/secondary CKD-IV.   #I would recommend proceeding with Feraheme infusions weekly x4; will plan to start p.o. iron after infusions.  Discussed  the potential acute infusion reactions with IV iron; which are quite rare.  Patient understands the risk; will proceed with infusions.   # Discussed use of erythropoietin stimulating agents like Aranesp/Retacrit to stimulate the bone marrow.  Discussed the potential issues with erythropoietin estimating agents-given the risk of stroke thromboembolic events/elevated blood pressure.  However, most of the serious events did not happen when the goal hematocrit  is 33/hemoglobin 30.   #CKD stage IV-secondary diabetes hypertension-followed by Dr. Juleen China.  #CHF-chronic compensated-on diuretics.  Thank you Dr.Kolluru for allowing me to participate in the care of your pleasant patient. Please do not hesitate to contact me with questions or concerns in the interim.   # DISPOSITION:. # labs today- order cbc/bmp/b12/folic acid/LDH # Kirtland Bouchard weekly x4- start next week # follow up in 6 week- MD; labs- cbc/bmp;Kirtland Bouchard- Dr.B    All questions were answered. The patient knows to call the clinic with any problems, questions or concerns.    Cammie Sickle, MD 08/01/2020 3:13 PM

## 2020-08-02 IMAGING — DX DG CHEST 1V
1 series · 1 of 1 positions shown · non-contrast
Comparison: 05/22/2020

CLINICAL DATA: Shortness of breath

EXAM:
CHEST  1 VIEW

[chest ap]
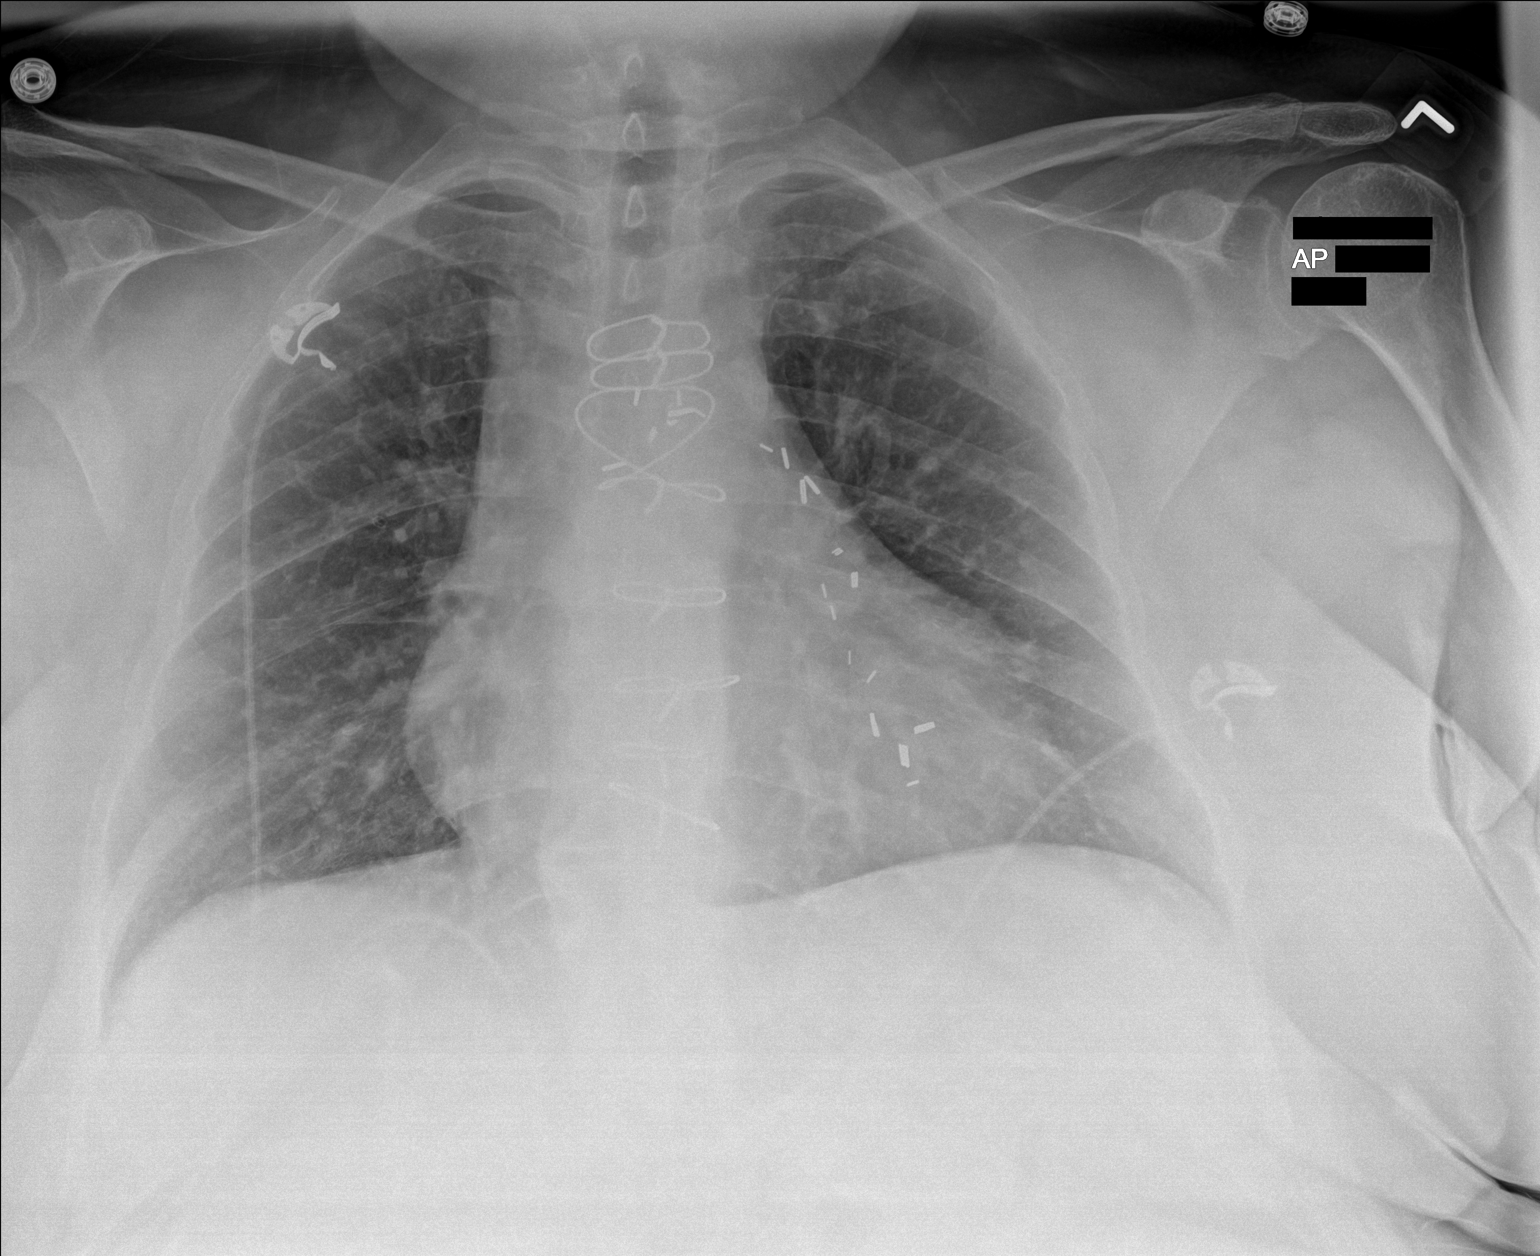

[1 of 1 positions shown; findings below may reference images not displayed]

FINDINGS: Cardiomegaly. CABG. There is no edema, consolidation, effusion, or
pneumothorax.
IMPRESSION: Stable exam.  No evidence of active disease.

## 2020-08-05 ENCOUNTER — Ambulatory Visit: Payer: Medicare Other

## 2020-08-05 ENCOUNTER — Telehealth (HOSPITAL_COMMUNITY): Payer: Self-pay

## 2020-08-05 NOTE — Telephone Encounter (Signed)
Wanted to call and check with Sharon Arias since home visit last week.  She has started weighing daily and keeping a log.  Her weight today was 252.8 lbs, she states has been the same most days, dropped a lb one day.  She is watching high sodium foods.  She is aware to call for help if weight goes up 3 lbs over night or 5 lbs in a week.  She states feeling good and aware of up coming appts.  She denies any swelling problems today.  She has all her medications and aware of how to take.  She and her husband places them in her pill boxes, did offer to that for her on a regular basis and she states they are good at the time.   [place] [phone]

## 2020-08-06 ENCOUNTER — Inpatient Hospital Stay: Payer: Medicare Other

## 2020-08-06 ENCOUNTER — Other Ambulatory Visit: Payer: Self-pay

## 2020-08-06 VITALS — BP 148/75 | HR 61 | Temp 96.0°F | Resp 19

## 2020-08-06 DIAGNOSIS — I130 Hypertensive heart and chronic kidney disease with heart failure and stage 1 through stage 4 chronic kidney disease, or unspecified chronic kidney disease: Secondary | ICD-10-CM | POA: Diagnosis not present

## 2020-08-06 DIAGNOSIS — D631 Anemia in chronic kidney disease: Secondary | ICD-10-CM

## 2020-08-06 MED ORDER — SODIUM CHLORIDE 0.9 % IV SOLN
Freq: Once | INTRAVENOUS | Status: AC
  Filled 2020-08-06: qty 250

## 2020-08-06 MED ORDER — SODIUM CHLORIDE 0.9 % IV SOLN
510.0000 mg | Freq: Once | INTRAVENOUS | Status: AC
  Administered 2020-08-06: 510 mg via INTRAVENOUS
  Filled 2020-08-06: qty 510

## 2020-08-06 NOTE — Progress Notes (Signed)
Pt tolerated Feraheme infusion well with no signs of complications. Pt observed for 30 minutes post Feraheme infusion per protocol. RN educated pt on the importance of notifying the clinic if any complications occur at home, pt verbalized understanding and all questions answered at this time. VSS. Pt stable for discharge.   Sharon Arias [employer]

## 2020-08-07 ENCOUNTER — Ambulatory Visit: Payer: Medicare Other

## 2020-08-12 ENCOUNTER — Ambulatory Visit: Payer: Medicare Other

## 2020-08-13 ENCOUNTER — Other Ambulatory Visit: Payer: Self-pay

## 2020-08-13 ENCOUNTER — Inpatient Hospital Stay: Payer: Medicare Other

## 2020-08-13 VITALS — BP 144/72 | HR 60 | Temp 96.7°F | Resp 18

## 2020-08-13 DIAGNOSIS — I130 Hypertensive heart and chronic kidney disease with heart failure and stage 1 through stage 4 chronic kidney disease, or unspecified chronic kidney disease: Secondary | ICD-10-CM | POA: Diagnosis not present

## 2020-08-13 DIAGNOSIS — D631 Anemia in chronic kidney disease: Secondary | ICD-10-CM

## 2020-08-13 MED ORDER — SODIUM CHLORIDE 0.9 % IV SOLN
510.0000 mg | Freq: Once | INTRAVENOUS | Status: AC
  Administered 2020-08-13: 510 mg via INTRAVENOUS
  Filled 2020-08-13: qty 17

## 2020-08-13 MED ORDER — SODIUM CHLORIDE 0.9 % IV SOLN
Freq: Once | INTRAVENOUS | Status: AC
  Filled 2020-08-13: qty 250

## 2020-08-14 ENCOUNTER — Telehealth (HOSPITAL_COMMUNITY): Payer: Self-pay

## 2020-08-14 ENCOUNTER — Ambulatory Visit: Payer: Medicare Other

## 2020-08-14 NOTE — Telephone Encounter (Signed)
Attempted to contact, no answer.  Left message for her to contact me and reminded her of HF clinic appt on Monday.  Will continue to visit for heart failure.   [place] [phone]

## 2020-08-16 NOTE — Progress Notes (Signed)
Patient ID: Sharon Arias, female    DOB: [birth year], 59 y.o.   MRN: [id number]  HPI  Sharon Arias is a 59 y/o female with a history of CAD, DM, hyperlipidemia, HTN, CKD, stroke, detached retina, MI, obesity, anemia and chronic heart failure.   Echo report from 03/18/20 reviewed and showed an EF of 55-60% along with trivial TR.  Admitted 05/20/20 due to acute on chronic HF. Patient had not taken diuretics. CT and CXR done. Cardiology consult obtained. Initially given IV lasix with transition to oral diuretics. HTN medications adjusted. Elevated troponin thought to be due to demand ischemia. Discharged after 4 days. Admitted 05/12/20 due to acute on chronic CKD along with UTI. Nephrology consulted. ARB and diuretic held. Given antibiotics for UTI. IVF given for rhabdo. Discharged after 3 days. Admitted 03/18/20 due to acute on chronic HF and acute right pontine infarct. Cardiology and neurology consults obtained. Initially given IV lasix and then transitioned to oral diuretics. Diuretics were held shortly and IVF given to due AKI. Found to have 70% stenosis in right anterior ICA.  Discharged after 4 days.   She presents today for a follow-up visit with a chief complaint of moderate fatigue upon minimal exertion. She describes this as chronic in nature having been present for several year although she feels like it might be improving some. She has associated shortness of breath, pedal edema and difficulty sleeping along with this. She denies any dizziness, abdominal distention, palpitations, chest pain, cough or weight gain.   Currently she is taking her furosemide every day, per nephrology instructions, to try and help her renal function. Says that she's drinking ~ 80 ounces of fluid daily. Had recent overnight admission in [place], [place] due to worsening shortness of breath.   She has great difficulty with her vision due to her DM and has to take pictures of instructions and then enlarge it on her phone so  that she can read it. Sharon Arias has just finished a clinical trial at [facility] for his stage IV sarcoma.   Past Medical History:  Diagnosis Date  . Anemia in chronic kidney disease 05/19/2016  . Blind   . CAD (coronary artery disease)   . CHF (congestive heart failure) ([place])   . Chicken pox   . Chronic kidney disease    per dr sparks  . CVA (cerebral vascular accident) ([place])   . Detached retina   . Diabetes mellitus without complication ([place])    type 2  . Diabetic neuropathy ([place])   . Diabetic retinopathy ([place])    legally blind  . Hyperlipidemia   . Hypertension   . Myocardial infarction ([place])   . Obesity   . PONV (postoperative nausea and vomiting)   . Sciatica of right side    going to see physiciatry   Past Surgical History:  Procedure Laterality Date  . COLONOSCOPY WITH PROPOFOL N/A 01/26/2016   Procedure: COLONOSCOPY WITH PROPOFOL;  Surgeon: Lollie Sails, MD;  Location: [facility] ENDOSCOPY;  Service: Endoscopy;  Laterality: N/A;  . COLONOSCOPY WITH PROPOFOL N/A 01/27/2016   Procedure: COLONOSCOPY WITH PROPOFOL;  Surgeon: Lollie Sails, MD;  Location: [facility] ENDOSCOPY;  Service: Endoscopy;  Laterality: N/A;  . CORONARY ARTERY BYPASS GRAFT  2013  . INCISION AND DRAINAGE     chest abscess  . INCISION AND DRAINAGE ABSCESS N/A 12/11/2018   Procedure: INCISION AND DRAINAGE PERINEAL;  Surgeon: Jules Husbands, MD;  Location: [facility] ORS;  Service: General;  Laterality: N/A;  .  RETINAL LASER PROCEDURE     Family History  Problem Relation Age of Onset  . Breast cancer Mother   . Prostate cancer Father        we think mets to liver and bone   Social History   Tobacco Use  . Smoking status: Never Smoker  . Smokeless tobacco: Never Used  Substance Use Topics  . Alcohol use: No   Allergies  Allergen Reactions  . Ozempic (0.25 Or 0.5 Mg-Dose) [Semaglutide(0.25 Or 0.5mg -Dos)] Diarrhea and Nausea Only  . Trulicity [Dulaglutide] Diarrhea and Nausea Only   Prior to Admission  medications   Medication Sig Start Date End Date Taking? Authorizing Provider  allopurinol (ZYLOPRIM) 300 MG tablet Take 1 tablet by mouth daily. 11/24/18  Yes [provider]  amLODipine (NORVASC) 10 MG tablet Take 10 mg by mouth at bedtime. 12/04/18  Yes [provider]  aspirin EC 81 MG tablet Take 81 mg by mouth every morning.   Yes [provider]  clopidogrel (PLAVIX) 75 MG tablet Take 1 tablet (75 mg total) by mouth daily. 07/23/19  Yes Vaughan Basta, MD  furosemide (LASIX) 20 MG tablet Take 1 tablet (20 mg total) by mouth every other day.  05/24/20  Yes Sheikh, Omair Latif, DO  hydrALAZINE (APRESOLINE) 25 MG tablet Take 1 tablet (25 mg total) by mouth 2 (two) times daily at 10 AM and 5 PM. Patient taking differently: Take 50 mg by mouth 2 (two) times daily at 10 AM and 5 PM.  05/24/20  Yes Sheikh, Omair Latif, DO  insulin glargine (LANTUS SOLOSTAR) 100 UNIT/ML Solostar Pen Inject 57 Units into the skin at bedtime. Patient taking differently: Inject 70 Units into the skin at bedtime.  05/24/20  Yes Sheikh, Omair Latif, DO  insulin lispro (HUMALOG) 100 UNIT/ML KwikPen Inject 15-30 Units into the skin See admin instructions. Pt. Uses 15 units/ml in the mornings, 30units/ml in the afternoon, and 40 units/ml at bedtime 10/02/18  Yes [provider]  isosorbide mononitrate (IMDUR) 30 MG 24 hr tablet Take 1 tablet (30 mg total) by mouth daily. 05/16/20  Yes Allie Bossier, MD  metoprolol tartrate (LOPRESSOR) 50 MG tablet Take 50 mg by mouth 2 (two) times daily.    Yes [provider]  rosuvastatin (CRESTOR) 20 MG tablet Take 20 mg by mouth at bedtime.  03/13/20  Yes [provider]     Review of Systems  Constitutional: Positive for fatigue (all the time). Negative for appetite change.  HENT: Positive for congestion. Negative for postnasal drip and sore throat.   Eyes: Positive for visual disturbance (great vision loss).  Respiratory:  Positive for shortness of breath (with moderate exertion). Negative for cough and chest tightness.   Cardiovascular: Positive for leg swelling. Negative for chest pain and palpitations.  Gastrointestinal: Negative for abdominal distention and abdominal pain.  Genitourinary: Negative.   Musculoskeletal: Negative for back pain and neck pain.  Skin: Negative.   Allergic/Immunologic: Negative.   Neurological: Negative for dizziness and light-headedness.  Hematological: Negative for adenopathy. Does not bruise/bleed easily.  Psychiatric/Behavioral: Positive for sleep disturbance (sleeping on 1 pillow with CPAP). Negative for dysphoric mood. The patient is not nervous/anxious.    Vitals:   08/18/20 1240  BP: (!) 137/50  Pulse: 60  Resp: 20  SpO2: 95%  Weight: 253 lb 8 oz (115 kg)  Height: 5\' 7"  (1.702 m)   Wt Readings from Last 3 Encounters:  08/18/20 253 lb 8 oz (115 kg)  08/01/20  255 lb (115.7 kg)  05/30/20 253 lb 8 oz (115 kg)   Lab Results  Component Value Date   CREATININE 3.13 (H) 08/01/2020   CREATININE 3.12 (H) 07/30/2020   CREATININE 3.54 (H) 05/24/2020    Physical Exam Vitals and nursing note reviewed.  Constitutional:      Appearance: Normal appearance.  HENT:     Head: Normocephalic and atraumatic.  Cardiovascular:     Rate and Rhythm: Regular rhythm. Bradycardia present.  Pulmonary:     Effort: Pulmonary effort is normal. No respiratory distress.     Breath sounds: No wheezing or rales.  Abdominal:     General: Abdomen is flat. There is no distension.     Palpations: Abdomen is soft.  Musculoskeletal:        General: No tenderness.     Cervical back: Normal range of motion and neck supple.     Right lower leg: Edema (1+ pitting) present.     Left lower leg: Edema (1+ pitting) present.  Skin:    General: Skin is warm and dry.  Neurological:     Mental Status: She is alert and oriented to person, place, and time. Mental status is at baseline.  Psychiatric:         Mood and Affect: Mood normal.        Behavior: Behavior normal.        Thought Content: Thought content normal.    Assessment & Plan:  1: Chronic heart failure with preserved ejection fraction (no structural changes)- - NYHA class III - euvolemic today - weighing at times but she says that she has to get a talking scale to weigh consistently; reminded to call for an overnight weight gain of >2 pounds or a weekly weight gain of >5 pounds - weight unchanged from last visit here 2.5 months ago - does not add salt and is using Mrs Deliah Boston for sodium - saw cardiology (Paraschos) 07/10/20 - BNP 05/20/20 was 726.3 - has received both COVID vaccine  - participating in paramedicine program   2: HTN- - BP looks good today - saw PCP (Sparks) 07/24/20 - BMP 08/13/20 reviewed and showed sodium 142, potassium 3.7, creatinine 3.4 and GFR 14  3: DM with CKD- - glucose at home today was 185 - saw nephrology (Kolluru) 08/14/20 & is now taking furosemide every other day; advised patient to message nephrology to confirm that he's ok with her drinking 80 ounces of fluid/ day - saw endocrinology (Solum) 06/18/20 - A1c 06/11/20 was 8.9%  4: Lymphedema- - stage 2 - limited in her ability to exercise due to vision difficulties - does wear compression socks but doesn't have them on today; says that she will put them on once she returns home - does elevate her legs when sitting for long periods of time - consider lymphapress compression boots if edema persists   Patient did not bring her medications nor a list. Each medication was verbally reviewed with the patient and she was encouraged to bring the bottles to every visit to confirm accuracy of list.  Return in 6 months or sooner for any questions/problems before then.

## 2020-08-18 ENCOUNTER — Other Ambulatory Visit: Payer: Self-pay

## 2020-08-18 ENCOUNTER — Ambulatory Visit: Payer: Medicare Other | Attending: Family | Admitting: Family

## 2020-08-18 ENCOUNTER — Encounter: Payer: Self-pay | Admitting: Family

## 2020-08-18 VITALS — BP 137/50 | HR 60 | Resp 20 | Ht 67.0 in | Wt 253.5 lb

## 2020-08-18 DIAGNOSIS — I509 Heart failure, unspecified: Secondary | ICD-10-CM | POA: Insufficient documentation

## 2020-08-18 DIAGNOSIS — N185 Chronic kidney disease, stage 5: Secondary | ICD-10-CM

## 2020-08-18 DIAGNOSIS — E11319 Type 2 diabetes mellitus with unspecified diabetic retinopathy without macular edema: Secondary | ICD-10-CM | POA: Diagnosis not present

## 2020-08-18 DIAGNOSIS — E1122 Type 2 diabetes mellitus with diabetic chronic kidney disease: Secondary | ICD-10-CM | POA: Diagnosis not present

## 2020-08-18 DIAGNOSIS — I2510 Atherosclerotic heart disease of native coronary artery without angina pectoris: Secondary | ICD-10-CM | POA: Insufficient documentation

## 2020-08-18 DIAGNOSIS — D631 Anemia in chronic kidney disease: Secondary | ICD-10-CM | POA: Diagnosis not present

## 2020-08-18 DIAGNOSIS — Z794 Long term (current) use of insulin: Secondary | ICD-10-CM | POA: Insufficient documentation

## 2020-08-18 DIAGNOSIS — I890 Lymphedema, not elsewhere classified: Secondary | ICD-10-CM

## 2020-08-18 DIAGNOSIS — I5032 Chronic diastolic (congestive) heart failure: Secondary | ICD-10-CM

## 2020-08-18 DIAGNOSIS — Z79899 Other long term (current) drug therapy: Secondary | ICD-10-CM | POA: Insufficient documentation

## 2020-08-18 DIAGNOSIS — E785 Hyperlipidemia, unspecified: Secondary | ICD-10-CM | POA: Diagnosis not present

## 2020-08-18 DIAGNOSIS — N189 Chronic kidney disease, unspecified: Secondary | ICD-10-CM | POA: Insufficient documentation

## 2020-08-18 DIAGNOSIS — E669 Obesity, unspecified: Secondary | ICD-10-CM | POA: Insufficient documentation

## 2020-08-18 DIAGNOSIS — Z7982 Long term (current) use of aspirin: Secondary | ICD-10-CM | POA: Insufficient documentation

## 2020-08-18 DIAGNOSIS — I130 Hypertensive heart and chronic kidney disease with heart failure and stage 1 through stage 4 chronic kidney disease, or unspecified chronic kidney disease: Secondary | ICD-10-CM | POA: Diagnosis present

## 2020-08-18 DIAGNOSIS — Z7902 Long term (current) use of antithrombotics/antiplatelets: Secondary | ICD-10-CM | POA: Insufficient documentation

## 2020-08-18 DIAGNOSIS — I252 Old myocardial infarction: Secondary | ICD-10-CM | POA: Diagnosis not present

## 2020-08-18 DIAGNOSIS — E1140 Type 2 diabetes mellitus with diabetic neuropathy, unspecified: Secondary | ICD-10-CM | POA: Diagnosis not present

## 2020-08-18 DIAGNOSIS — Z8673 Personal history of transient ischemic attack (TIA), and cerebral infarction without residual deficits: Secondary | ICD-10-CM | POA: Diagnosis not present

## 2020-08-18 DIAGNOSIS — I10 Essential (primary) hypertension: Secondary | ICD-10-CM

## 2020-08-18 DIAGNOSIS — Z951 Presence of aortocoronary bypass graft: Secondary | ICD-10-CM | POA: Diagnosis not present

## 2020-08-18 NOTE — Patient Instructions (Signed)
Continue weighing daily and call for an overnight weight gain of > 2 pounds or a weekly weight gain of >5 pounds. 

## 2020-08-19 ENCOUNTER — Ambulatory Visit: Payer: Medicare Other

## 2020-08-20 ENCOUNTER — Inpatient Hospital Stay: Payer: Medicare Other | Attending: Internal Medicine

## 2020-08-20 ENCOUNTER — Other Ambulatory Visit: Payer: Self-pay

## 2020-08-20 VITALS — BP 147/68 | HR 60 | Temp 98.1°F | Resp 18

## 2020-08-20 DIAGNOSIS — E611 Iron deficiency: Secondary | ICD-10-CM | POA: Insufficient documentation

## 2020-08-20 DIAGNOSIS — D631 Anemia in chronic kidney disease: Secondary | ICD-10-CM

## 2020-08-20 DIAGNOSIS — N184 Chronic kidney disease, stage 4 (severe): Secondary | ICD-10-CM | POA: Insufficient documentation

## 2020-08-20 MED ORDER — SODIUM CHLORIDE 0.9 % IV SOLN
510.0000 mg | Freq: Once | INTRAVENOUS | Status: AC
  Administered 2020-08-20: 510 mg via INTRAVENOUS
  Filled 2020-08-20: qty 510

## 2020-08-20 MED ORDER — SODIUM CHLORIDE 0.9 % IV SOLN
Freq: Once | INTRAVENOUS | Status: AC
  Filled 2020-08-20: qty 250

## 2020-08-21 ENCOUNTER — Ambulatory Visit: Payer: Medicare Other

## 2020-08-27 ENCOUNTER — Inpatient Hospital Stay: Payer: Medicare Other

## 2020-08-27 ENCOUNTER — Other Ambulatory Visit: Payer: Self-pay

## 2020-08-27 VITALS — BP 135/51 | HR 57 | Temp 97.0°F | Resp 18

## 2020-08-27 DIAGNOSIS — D631 Anemia in chronic kidney disease: Secondary | ICD-10-CM

## 2020-08-27 DIAGNOSIS — N184 Chronic kidney disease, stage 4 (severe): Secondary | ICD-10-CM | POA: Diagnosis not present

## 2020-08-27 MED ORDER — SODIUM CHLORIDE 0.9 % IV SOLN
510.0000 mg | Freq: Once | INTRAVENOUS | Status: AC
  Administered 2020-08-27: 510 mg via INTRAVENOUS
  Filled 2020-08-27: qty 510

## 2020-08-27 MED ORDER — SODIUM CHLORIDE 0.9 % IV SOLN
Freq: Once | INTRAVENOUS | Status: AC
  Filled 2020-08-27: qty 250

## 2020-08-27 NOTE — Progress Notes (Signed)
Pt unable to stay for full 30 minute observation due to another MD appt. Pt denies any concerns with previous Feraheme infusion. No s/s of distress or reaction noted. Pt and tolerated infusion well. Pt and VS stable at discharge.

## 2020-09-01 ENCOUNTER — Ambulatory Visit: Payer: Medicare Other | Admitting: Family

## 2020-09-02 ENCOUNTER — Other Ambulatory Visit (INDEPENDENT_AMBULATORY_CARE_PROVIDER_SITE_OTHER): Payer: Self-pay | Admitting: Nephrology

## 2020-09-02 DIAGNOSIS — N184 Chronic kidney disease, stage 4 (severe): Secondary | ICD-10-CM

## 2020-09-03 ENCOUNTER — Other Ambulatory Visit: Payer: Self-pay

## 2020-09-03 ENCOUNTER — Ambulatory Visit (INDEPENDENT_AMBULATORY_CARE_PROVIDER_SITE_OTHER): Payer: Medicare Other

## 2020-09-03 DIAGNOSIS — N184 Chronic kidney disease, stage 4 (severe): Secondary | ICD-10-CM

## 2020-09-12 ENCOUNTER — Inpatient Hospital Stay: Payer: Medicare Other

## 2020-09-12 ENCOUNTER — Other Ambulatory Visit: Payer: Self-pay

## 2020-09-12 ENCOUNTER — Inpatient Hospital Stay (HOSPITAL_BASED_OUTPATIENT_CLINIC_OR_DEPARTMENT_OTHER): Payer: Medicare Other | Admitting: Internal Medicine

## 2020-09-12 ENCOUNTER — Encounter: Payer: Self-pay | Admitting: Internal Medicine

## 2020-09-12 VITALS — BP 124/57 | HR 63 | Temp 97.0°F | Resp 16 | Ht 67.0 in | Wt 258.0 lb

## 2020-09-12 VITALS — BP 140/75 | HR 58 | Resp 19

## 2020-09-12 DIAGNOSIS — N184 Chronic kidney disease, stage 4 (severe): Secondary | ICD-10-CM

## 2020-09-12 DIAGNOSIS — D631 Anemia in chronic kidney disease: Secondary | ICD-10-CM

## 2020-09-12 LAB — CBC WITH DIFFERENTIAL/PLATELET
Abs Immature Granulocytes: 0.06 10*3/uL (ref 0.00–0.07)
Basophils Absolute: 0.1 10*3/uL (ref 0.0–0.1)
Basophils Relative: 1 %
Eosinophils Absolute: 0.4 10*3/uL (ref 0.0–0.5)
Eosinophils Relative: 4 %
HCT: 30.1 % — ABNORMAL LOW (ref 36.0–46.0)
Hemoglobin: 10.5 g/dL — ABNORMAL LOW (ref 12.0–15.0)
Immature Granulocytes: 1 %
Lymphocytes Relative: 17 %
Lymphs Abs: 2 10*3/uL (ref 0.7–4.0)
MCH: 30.4 pg (ref 26.0–34.0)
MCHC: 34.9 g/dL (ref 30.0–36.0)
MCV: 87.2 fL (ref 80.0–100.0)
Monocytes Absolute: 1.1 10*3/uL — ABNORMAL HIGH (ref 0.1–1.0)
Monocytes Relative: 10 %
Neutro Abs: 7.6 10*3/uL (ref 1.7–7.7)
Neutrophils Relative %: 67 %
Platelets: 265 10*3/uL (ref 150–400)
RBC: 3.45 MIL/uL — ABNORMAL LOW (ref 3.87–5.11)
RDW: 15.1 % (ref 11.5–15.5)
WBC: 11.3 10*3/uL — ABNORMAL HIGH (ref 4.0–10.5)
nRBC: 0 % (ref 0.0–0.2)

## 2020-09-12 LAB — BASIC METABOLIC PANEL
Anion gap: 13 (ref 5–15)
BUN: 60 mg/dL — ABNORMAL HIGH (ref 6–20)
CO2: 22 mmol/L (ref 22–32)
Calcium: 9 mg/dL (ref 8.9–10.3)
Chloride: 107 mmol/L (ref 98–111)
Creatinine, Ser: 3.02 mg/dL — ABNORMAL HIGH (ref 0.44–1.00)
GFR calc Af Amer: 19 mL/min — ABNORMAL LOW (ref >60)
GFR calc non Af Amer: 16 mL/min — ABNORMAL LOW (ref >60)
Glucose, Bld: 165 mg/dL — ABNORMAL HIGH (ref 70–99)
Potassium: 4 mmol/L (ref 3.5–5.1)
Sodium: 142 mmol/L (ref 135–145)

## 2020-09-12 MED ORDER — SODIUM CHLORIDE 0.9 % IV SOLN
Freq: Once | INTRAVENOUS | Status: AC
  Filled 2020-09-12: qty 250

## 2020-09-12 MED ORDER — SODIUM CHLORIDE 0.9 % IV SOLN
510.0000 mg | Freq: Once | INTRAVENOUS | Status: AC
  Administered 2020-09-12: 510 mg via INTRAVENOUS
  Filled 2020-09-12: qty 17

## 2020-09-12 NOTE — Assessment & Plan Note (Addendum)
#  Severe anemia hemoglobin 7-8 [June 2021 iron saturation 18%]-symptomatic iron deficiency/secondary CKD-IV. Improved- Hb today 10.5; proceed with Ferrahem infusion today.  Discussed the role of Retacrit if hemoglobin drops even after iron infusions.  #CKD stage IV-secondary diabetes hypertension-followed by Dr. Juleen China.  #CHF-chronic compensated-on diuretics.  # DISPOSITION:. # Ferrahem today;  # follow up in 1st week of dec 2021 MD; labs- cbc/bmp;Ferrahem- Dr.B

## 2020-09-12 NOTE — Progress Notes (Signed)
[place] NOTE  Patient Care Team: Idelle Crouch, MD as PCP - General (Internal Medicine)  CHIEF COMPLAINTS/PURPOSE OF CONSULTATION: Anemia  HEMATOLOGY HISTORY  # ANEMIA SEC to CKD- stage IV;  EGD-none-; 2017 FEB-colonoscopy [Dr.Skulskie]- ; capsule/ Bone marrow Biopsy-None; June 2021 [June iron sat-17%; ferritin-253]  # CKD stage IV [Dr.Kolluru]; Not on HD/ CHF [Dr. Paraschos]; diabetes on Insulin.   HISTORY OF PRESENTING ILLNESS:  Sharon Arias 59 y.o.  female with a history of CHF/CKD stage IV-anemia is here for follow-up.  Patient status post IV iron infusion noted improvement of her energy levels however not back to baseline.  No iron infusion related reactions.  Denies any blood in stool or black or stools.    Review of Systems  Constitutional: Positive for malaise/fatigue. Negative for chills, diaphoresis, fever and weight loss.  HENT: Negative for nosebleeds and sore throat.   Eyes: Negative for double vision.  Respiratory: Positive for shortness of breath. Negative for cough, hemoptysis, sputum production and wheezing.   Cardiovascular: Positive for leg swelling. Negative for chest pain, palpitations and orthopnea.  Gastrointestinal: Negative for abdominal pain, blood in stool, constipation, diarrhea, heartburn, melena, nausea and vomiting.  Genitourinary: Negative for dysuria, frequency and urgency.  Musculoskeletal: Negative for back pain and joint pain.  Skin: Negative.  Negative for itching and rash.  Neurological: Negative for dizziness, tingling, focal weakness, weakness and headaches.  Endo/Heme/Allergies: Does not bruise/bleed easily.  Psychiatric/Behavioral: Negative for depression. The patient is not nervous/anxious and does not have insomnia.     MEDICAL HISTORY:  Past Medical History:  Diagnosis Date  . Anemia in chronic kidney disease 05/19/2016  . Blind   . CAD (coronary artery disease)   . CHF (congestive heart failure)  ([place])   . Chicken pox   . Chronic kidney disease    per dr sparks  . CVA (cerebral vascular accident) ([place])   . Detached retina   . Diabetes mellitus without complication ([place])    type 2  . Diabetic neuropathy ([place])   . Diabetic retinopathy ([place])    legally blind  . Hyperlipidemia   . Hypertension   . Myocardial infarction ([place])   . Obesity   . PONV (postoperative nausea and vomiting)   . Sciatica of right side    going to see physiciatry    SURGICAL HISTORY: Past Surgical History:  Procedure Laterality Date  . COLONOSCOPY WITH PROPOFOL N/A 01/26/2016   Procedure: COLONOSCOPY WITH PROPOFOL;  Surgeon: Lollie Sails, MD;  Location[facility] ENDOSCOPY;  Service: Endoscopy;  Laterality: N/A;  . COLONOSCOPY WITH PROPOFOL N/A 01/27/2016   Procedure: COLONOSCOPY WITH PROPOFOL;  Surgeon: Lollie Sails, MD;  Location: [facility] ENDOSCOPY;  Service: Endoscopy;  Laterality: N/A;  . CORONARY ARTERY BYPASS GRAFT  2013  . INCISION AND DRAINAGE     chest abscess  . INCISION AND DRAINAGE ABSCESS N/A 12/11/2018   Procedure: INCISION AND DRAINAGE PERINEAL;  Surgeon: Jules Husbands, MD;  Location: [facility] ORS;  Service: General;  Laterality: N/A;  . RETINAL LASER PROCEDURE      SOCIAL HISTORY: Social History   Socioeconomic History  . Marital status: Married    Spouse name: Not on file  . Number of children: Not on file  . Years of education: Not on file  . Highest education level: Not on file  Occupational History  . Not on file  Tobacco Use  . Smoking status: Never Smoker  . Smokeless tobacco: Never Used  Vaping Use  . Vaping Use: Never used  Substance and Sexual Activity  . Alcohol use: No  . Drug use: No  . Sexual activity: Not Currently  Other Topics Concern  . Not on file  Social History Narrative  . Not on file   Social Determinants of Health   Financial Resource Strain:   . Difficulty of Paying Living Expenses: Not on file  Food Insecurity:   . Worried About Ship broker in the Last Year: Not on file  . Ran Out of Food in the Last Year: Not on file  Transportation Needs:   . Lack of Transportation (Medical): Not on file  . Lack of Transportation (Non-Medical): Not on file  Physical Activity:   . Days of Exercise per Week: Not on file  . Minutes of Exercise per Session: Not on file  Stress:   . Feeling of Stress : Not on file  Social Connections:   . Frequency of Communication with Friends and Family: Not on file  . Frequency of Social Gatherings with Friends and Family: Not on file  . Attends Religious Services: Not on file  . Active Member of Clubs or Organizations: Not on file  . Attends Archivist Meetings: Not on file  . Marital Status: Not on file  Intimate Partner Violence:   . Fear of Current or Ex-Partner: Not on file  . Emotionally Abused: Not on file  . Physically Abused: Not on file  . Sexually Abused: Not on file    FAMILY HISTORY: Family History  Problem Relation Age of Onset  . Breast cancer Mother   . Prostate cancer Father        we think mets to liver and bone    ALLERGIES:  is allergic to ozempic (0.25 or 0.5 mg-dose) [semaglutide(0.25 or 0.5mg -dos)] and trulicity [dulaglutide].  MEDICATIONS:  Current Outpatient Medications  Medication Sig Dispense Refill  . allopurinol (ZYLOPRIM) 300 MG tablet Take 1 tablet by mouth daily.    [name] amLODipine (NORVASC) 10 MG tablet Take 10 mg by mouth at bedtime.    [name] aspirin EC 81 MG tablet Take 81 mg by mouth every morning.    . clopidogrel (PLAVIX) 75 MG tablet Take 1 tablet (75 mg total) by mouth daily. 30 tablet 0  . furosemide (LASIX) 20 MG tablet Take 1 tablet (20 mg total) by mouth every other day. 30 tablet 0  . hydrALAZINE (APRESOLINE) 25 MG tablet Take 1 tablet (25 mg total) by mouth 2 (two) times daily at 10 AM and 5 PM. (Patient taking differently: Take 50 mg by mouth 2 (two) times daily at 10 AM and 5 PM. ) 60 tablet 0  . insulin glargine (LANTUS SOLOSTAR) 100  UNIT/ML Solostar Pen Inject 57 Units into the skin at bedtime. (Patient taking differently: Inject 70 Units into the skin at bedtime. ) 15 mL 11  . insulin lispro (HUMALOG) 100 UNIT/ML KwikPen Inject 15-30 Units into the skin See admin instructions. Pt. Uses 15 units/ml in the mornings, 30units/ml in the afternoon, and 40 units/ml at bedtime    . isosorbide mononitrate (IMDUR) 30 MG 24 hr tablet Take 1 tablet (30 mg total) by mouth daily. 30 tablet 0  . metoprolol tartrate (LOPRESSOR) 50 MG tablet Take 50 mg by mouth 2 (two) times daily.     . rosuvastatin (CRESTOR) 20 MG tablet Take 20 mg by mouth at bedtime.      No current facility-administered medications for this visit.  PHYSICAL EXAMINATION:   Vitals:   09/12/20 1353  BP: (!) 124/57  Pulse: 63  Resp: 16  Temp: (!) 97 F (36.1 C)  SpO2: 97%   Filed Weights   09/12/20 1353  Weight: 258 lb (117 kg)    Physical Exam Constitutional:      Comments: Obese.  Alone.  Walk independently.  HENT:     Head: Normocephalic and atraumatic.     Mouth/Throat:     Pharynx: No oropharyngeal exudate.  Eyes:     Pupils: Pupils are equal, round, and reactive to light.  Cardiovascular:     Rate and Rhythm: Normal rate and regular rhythm.  Pulmonary:     Effort: Pulmonary effort is normal. No respiratory distress.     Breath sounds: Normal breath sounds. No wheezing.  Abdominal:     General: Bowel sounds are normal. There is no distension.     Palpations: Abdomen is soft. There is no mass.     Tenderness: There is no abdominal tenderness. There is no guarding or rebound.  Musculoskeletal:        General: No tenderness. Normal range of motion.     Cervical back: Normal range of motion and neck supple.  Skin:    General: Skin is warm.  Neurological:     Mental Status: She is alert and oriented to person, place, and time.  Psychiatric:        Mood and Affect: Affect normal.     LABORATORY DATA:  I have reviewed the data as  listed Lab Results  Component Value Date   WBC 11.3 (H) 09/12/2020   HGB 10.5 (L) 09/12/2020   HCT 30.1 (L) 09/12/2020   MCV 87.2 09/12/2020   PLT 265 09/12/2020   Recent Labs    05/22/20 0427 05/22/20 0427 05/23/20 0522 05/23/20 0522 05/24/20 0520 05/24/20 0520 07/30/20 1351 08/01/20 1455 09/12/20 1331  NA 134*   < > 136   < > 138   < > 142 139 142  K 3.5   < > 3.6   < > 3.7   < > 3.9 4.1 4.0  CL 101   < > 99   < > 102   < > 106 104 107  CO2 22   < > 23   < > 21*   < > 23 21* 22  GLUCOSE 260*   < > 235*   < > 192*   < > 218* 159* 165*  BUN 81*   < > 98*   < > 98*   < > 57* 60* 60*  CREATININE 4.01*   < > 3.89*   < > 3.54*   < > 3.12* 3.13* 3.02*  CALCIUM 8.9   < > 9.2   < > 9.1   < > 9.4 9.2 9.0  GFRNONAA 12*   < > 12*   < > 13*   < > 16* 16* 16*  GFRAA 13*   < > 14*   < > 16*   < > 18* 18* 19*  PROT 7.2  --  7.7  --  7.2  --   --   --   --   ALBUMIN 3.2*  --  3.5  --  3.3*  --   --   --   --   AST 25  --  20  --  17  --   --   --   --   ALT 34  --  34  --  29  --   --   --   --   ALKPHOS 61  --  73  --  68  --   --   --   --   BILITOT 0.6  --  0.8  --  0.6  --   --   --   --    < > = values in this interval not displayed.     VAS [place] UPPER EXTREMITY ARTERIAL DUPLEX  Result Date: 09/08/2020 UPPER EXTREMITY DUPLEX STUDY Indications: Patient complains of esrd pre access.  Performing Technologist: Concha Norway RVT  Examination Guidelines: A complete evaluation includes B-mode imaging, spectral Doppler, color Doppler, and power Doppler as needed of all accessible portions of each vessel. Bilateral testing is considered an integral part of a complete examination. Limited examinations for reoccurring indications may be performed as noted.  Right Pre-Dialysis Findings: +-----------------------+----------+--------------------+---------+--------+ Location               PSV (cm/s)Intralum. Diam. (cm)Waveform Comments  +-----------------------+----------+--------------------+---------+--------+ Brachial Antecub. fossa139       0.53                triphasic         +-----------------------+----------+--------------------+---------+--------+ Radial Art at Wrist    51        0.19                triphasic         +-----------------------+----------+--------------------+---------+--------+ Ulnar Art at Wrist     0         126.00              triphasic         +-----------------------+----------+--------------------+---------+--------+ Left Pre-Dialysis Findings: +-----------------------+----------+--------------------+---------+--------+ Location               PSV (cm/s)Intralum. Diam. (cm)Waveform Comments +-----------------------+----------+--------------------+---------+--------+ Brachial Antecub. fossa158       0.52                triphasic         +-----------------------+----------+--------------------+---------+--------+ Radial Art at Wrist    89        0.25                triphasic         +-----------------------+----------+--------------------+---------+--------+ Ulnar Art at Wrist     105       0.29                triphasic         +-----------------------+----------+--------------------+---------+--------+  Summary:  Right: No obstruction visualized in the right upper extremity. Left: No obstruction visualized in the left upper extremity. *See table(s) above for measurements and observations. Electronically signed by [name] MD on 09/08/2020 at 4:53:26 PM.    Final    VAS [place] UPPER EXT VEIN MAPPING (PRE-OP AVF)  Result Date: 09/08/2020 UPPER EXTREMITY VEIN MAPPING  Indications: Pre-access. History: Esrd.  Performing Technologist: Concha Norway RVT  Examination Guidelines: A complete evaluation includes B-mode imaging, spectral Doppler, color Doppler, and power Doppler as needed of all accessible portions of each vessel. Bilateral testing is considered an integral part of a  complete examination. Limited examinations for reoccurring indications may be performed as noted. +-----------------+-------------+----------+--------+ Right Cephalic   Diameter (cm)Depth (cm)Findings +-----------------+-------------+----------+--------+ Prox upper arm       0.29                        +-----------------+-------------+----------+--------+  Mid upper arm        0.38                        +-----------------+-------------+----------+--------+ Dist upper arm       0.29                        +-----------------+-------------+----------+--------+ Antecubital fossa    0.26                        +-----------------+-------------+----------+--------+ Prox forearm         0.24                        +-----------------+-------------+----------+--------+ Mid forearm          0.25                        +-----------------+-------------+----------+--------+ Dist forearm         0.29                        +-----------------+-------------+----------+--------+ +-----------------+-------------+----------+--------+ Right Basilic    Diameter (cm)Depth (cm)Findings +-----------------+-------------+----------+--------+ Prox upper arm       0.52                        +-----------------+-------------+----------+--------+ Mid upper arm        0.52                        +-----------------+-------------+----------+--------+ Dist upper arm       0.39                        +-----------------+-------------+----------+--------+ Antecubital fossa    0.37                        +-----------------+-------------+----------+--------+ Prox forearm         0.25                        +-----------------+-------------+----------+--------+ +-----------------+-------------+----------+--------+ Left Cephalic    Diameter (cm)Depth (cm)Findings +-----------------+-------------+----------+--------+ Prox upper arm       0.47                         +-----------------+-------------+----------+--------+ Mid upper arm        0.40                        +-----------------+-------------+----------+--------+ Dist upper arm       0.37                        +-----------------+-------------+----------+--------+ Antecubital fossa    0.29                        +-----------------+-------------+----------+--------+ Prox forearm         0.29                        +-----------------+-------------+----------+--------+ Mid forearm          0.40                        +-----------------+-------------+----------+--------+ Dist forearm  0.22                        +-----------------+-------------+----------+--------+ +-----------------+-------------+----------+--------+ Left Basilic     Diameter (cm)Depth (cm)Findings +-----------------+-------------+----------+--------+ Prox upper arm       0.43                        +-----------------+-------------+----------+--------+ Mid upper arm        0.47                        +-----------------+-------------+----------+--------+ Dist upper arm       0.43                        +-----------------+-------------+----------+--------+ Antecubital fossa    0.42                        +-----------------+-------------+----------+--------+ Prox forearm         0.35                        +-----------------+-------------+----------+--------+ Summary: Right: Good caliper and compressible veins. Left: Good caliper and compressible veins. *See table(s) above for measurements and observations.  Diagnosing physician: [name] MD Electronically signed by [name] MD on 09/08/2020 at 4:53:23 PM.    Final     Anemia of chronic kidney failure, stage 4 (severe) (HCC) #Severe anemia hemoglobin 7-8 [June 2021 iron saturation 18%]-symptomatic iron deficiency/secondary CKD-IV. Improved- Hb today 10.5; proceed with Ferrahem infusion today.  Discussed the role of Retacrit if  hemoglobin drops even after iron infusions.  #CKD stage IV-secondary diabetes hypertension-followed by Dr. Juleen China.  #CHF-chronic compensated-on diuretics.  # DISPOSITION:. # Ferrahem today;  # follow up in 1st week of dec 2021 MD; labs- cbc/bmp;[place]- Dr.B    All questions were answered. The patient knows to call the clinic with any problems, questions or concerns.    Cammie Sickle, MD 09/15/2020 7:38 AM

## 2020-09-16 ENCOUNTER — Telehealth (HOSPITAL_COMMUNITY): Payer: Self-pay

## 2020-09-16 NOTE — Telephone Encounter (Signed)
Attempted to contact, will continue to try.   [place] [phone]

## 2020-09-18 ENCOUNTER — Telehealth (HOSPITAL_COMMUNITY): Payer: Self-pay

## 2020-09-18 NOTE — Telephone Encounter (Signed)
Today had a telephone visit with Sharon Arias.  She states she has been doing well, keeping weight about the same, today 251.3 lbs. She is taking furosemide everyother day and sometimes take an extra in between if weight is up 2 lbs.  She has not took it very often.  She watches her high sodium foods and fluids daily.  She is going to her appts and aware of up coming appts.  She has all her medications.  Denies shortness of breath, chest pain, headaches or dizziness.  Will continue to visit for heart failure.   [place] [phone]

## 2020-10-14 ENCOUNTER — Telehealth (HOSPITAL_COMMUNITY): Payer: Self-pay

## 2020-10-14 NOTE — Telephone Encounter (Signed)
Attempted to contact, no answer left message.  Will attempt again.   [place] [phone]

## 2020-10-21 ENCOUNTER — Other Ambulatory Visit: Payer: Self-pay

## 2020-10-21 ENCOUNTER — Ambulatory Visit
Admission: RE | Admit: 2020-10-21 | Discharge: 2020-10-21 | Disposition: A | Discharge status: Home / Self Care | Payer: Medicare Other | Source: Ambulatory Visit | Attending: Internal Medicine | Admitting: Internal Medicine

## 2020-10-21 DIAGNOSIS — Z1231 Encounter for screening mammogram for malignant neoplasm of breast: Secondary | ICD-10-CM | POA: Diagnosis present

## 2020-11-27 ENCOUNTER — Other Ambulatory Visit: Payer: Self-pay | Admitting: *Deleted

## 2020-11-27 DIAGNOSIS — D631 Anemia in chronic kidney disease: Secondary | ICD-10-CM

## 2020-11-28 ENCOUNTER — Inpatient Hospital Stay: Payer: Medicare Other

## 2020-11-28 ENCOUNTER — Inpatient Hospital Stay (HOSPITAL_BASED_OUTPATIENT_CLINIC_OR_DEPARTMENT_OTHER): Payer: Medicare Other | Admitting: Internal Medicine

## 2020-11-28 ENCOUNTER — Inpatient Hospital Stay: Payer: Medicare Other | Attending: Internal Medicine

## 2020-11-28 ENCOUNTER — Other Ambulatory Visit: Payer: Self-pay

## 2020-11-28 ENCOUNTER — Encounter: Payer: Self-pay | Admitting: Internal Medicine

## 2020-11-28 VITALS — BP 124/68 | HR 59 | Resp 16

## 2020-11-28 DIAGNOSIS — D631 Anemia in chronic kidney disease: Secondary | ICD-10-CM | POA: Diagnosis not present

## 2020-11-28 DIAGNOSIS — N184 Chronic kidney disease, stage 4 (severe): Secondary | ICD-10-CM

## 2020-11-28 DIAGNOSIS — E611 Iron deficiency: Secondary | ICD-10-CM | POA: Insufficient documentation

## 2020-11-28 LAB — BASIC METABOLIC PANEL
Anion gap: 14 (ref 5–15)
BUN: 80 mg/dL — ABNORMAL HIGH (ref 6–20)
CO2: 20 mmol/L — ABNORMAL LOW (ref 22–32)
Calcium: 9.3 mg/dL (ref 8.9–10.3)
Chloride: 108 mmol/L (ref 98–111)
Creatinine, Ser: 3.53 mg/dL — ABNORMAL HIGH (ref 0.44–1.00)
GFR, Estimated: 14 mL/min — ABNORMAL LOW (ref >60)
Glucose, Bld: 94 mg/dL (ref 70–99)
Potassium: 4.5 mmol/L (ref 3.5–5.1)
Sodium: 142 mmol/L (ref 135–145)

## 2020-11-28 LAB — CBC WITH DIFFERENTIAL/PLATELET
Abs Immature Granulocytes: 0.09 10*3/uL — ABNORMAL HIGH (ref 0.00–0.07)
Basophils Absolute: 0.1 10*3/uL (ref 0.0–0.1)
Basophils Relative: 1 %
Eosinophils Absolute: 0.5 10*3/uL (ref 0.0–0.5)
Eosinophils Relative: 5 %
HCT: 29.4 % — ABNORMAL LOW (ref 36.0–46.0)
Hemoglobin: 9.9 g/dL — ABNORMAL LOW (ref 12.0–15.0)
Immature Granulocytes: 1 %
Lymphocytes Relative: 23 %
Lymphs Abs: 2.3 10*3/uL (ref 0.7–4.0)
MCH: 30.1 pg (ref 26.0–34.0)
MCHC: 33.7 g/dL (ref 30.0–36.0)
MCV: 89.4 fL (ref 80.0–100.0)
Monocytes Absolute: 1 10*3/uL (ref 0.1–1.0)
Monocytes Relative: 10 %
Neutro Abs: 6.2 10*3/uL (ref 1.7–7.7)
Neutrophils Relative %: 60 %
Platelets: 265 10*3/uL (ref 150–400)
RBC: 3.29 MIL/uL — ABNORMAL LOW (ref 3.87–5.11)
RDW: 13.8 % (ref 11.5–15.5)
WBC: 10.1 10*3/uL (ref 4.0–10.5)
nRBC: 0 % (ref 0.0–0.2)

## 2020-11-28 MED ORDER — SODIUM CHLORIDE 0.9 % IV SOLN
Freq: Once | INTRAVENOUS | Status: AC
  Filled 2020-11-28: qty 250

## 2020-11-28 MED ORDER — SODIUM CHLORIDE 0.9 % IV SOLN
510.0000 mg | Freq: Once | INTRAVENOUS | Status: AC
  Administered 2020-11-28: 510 mg via INTRAVENOUS
  Filled 2020-11-28: qty 510

## 2020-11-28 NOTE — Assessment & Plan Note (Signed)
#  Severe anemia hemoglobin 7-8 [June 2021 iron saturation 18%]-symptomatic iron deficiency/secondary CKD-IV. Improved- Hb today 9.9-  proceed with Ferrahem infusion today.  Discussed the role of Retacrit if hemoglobin drops even after iron infusions.  #CKD stage IV-secondary diabetes hypertension-followed by Dr. Juleen China.  #CHF-chronic compensated-on diuretics.  # DISPOSITION:. # Ferrahem infusion today.  # in 1 month- cbc/ferrahem # follow up in 3 months-MD; labs- cbc/bmp;iron studies/ferritin-Ferrahem- Dr.B

## 2020-11-28 NOTE — Progress Notes (Signed)
Patient tolerated infusion well. Patient declined post observation for Feraheme. Patient and VSS. Discharged home.

## 2020-11-28 NOTE — Progress Notes (Signed)
Patient here for oncology follow-up appointment, expresses concerns of occasional shortness of breath  

## 2020-11-28 NOTE — Progress Notes (Signed)
[facility] NOTE  Patient Care Team: Idelle Crouch, MD as PCP - General (Internal Medicine)  CHIEF COMPLAINTS/PURPOSE OF CONSULTATION: Anemia  HEMATOLOGY HISTORY  # ANEMIA SEC to CKD- stage IV;  EGD-none-; 2017 FEB-colonoscopy [Dr.Skulskie]- ; capsule/ Bone marrow Biopsy-None; June 2021 [June iron sat-17%; ferritin-253]  # CKD stage IV [Dr.Kolluru]; Not on HD/ CHF [Dr. Paraschos]; diabetes on Insulin.   HISTORY OF PRESENTING ILLNESS:  Sharon Arias 59 y.o.  female with a history of CHF/CKD stage IV-anemia is here for follow-up.  In the interim patient had been evaluated by nephrology.  States that renal function is stable.  Patient has not had any hospitalizations.  Chronic shortness of breath especially exertion.  Denies any blood in stools or black stools.  Review of Systems  Constitutional: Positive for malaise/fatigue. Negative for chills, diaphoresis, fever and weight loss.  HENT: Negative for nosebleeds and sore throat.   Eyes: Negative for double vision.  Respiratory: Positive for shortness of breath. Negative for cough, hemoptysis, sputum production and wheezing.   Cardiovascular: Positive for leg swelling. Negative for chest pain, palpitations and orthopnea.  Gastrointestinal: Negative for abdominal pain, blood in stool, constipation, diarrhea, heartburn, melena, nausea and vomiting.  Genitourinary: Negative for dysuria, frequency and urgency.  Musculoskeletal: Negative for back pain and joint pain.  Skin: Negative.  Negative for itching and rash.  Neurological: Negative for dizziness, tingling, focal weakness, weakness and headaches.  Endo/Heme/Allergies: Does not bruise/bleed easily.  Psychiatric/Behavioral: Negative for depression. The patient is not nervous/anxious and does not have insomnia.     MEDICAL HISTORY:  Past Medical History:  Diagnosis Date  . Anemia in chronic kidney disease 05/19/2016  . Blind   . CAD (coronary artery  disease)   . CHF (congestive heart failure) ([place])   . Chicken pox   . Chronic kidney disease    per dr sparks  . CVA (cerebral vascular accident) ([place])   . Detached retina   . Diabetes mellitus without complication ([place])    type 2  . Diabetic neuropathy ([place])   . Diabetic retinopathy ([place])    legally blind  . Hyperlipidemia   . Hypertension   . Myocardial infarction ([place])   . Obesity   . PONV (postoperative nausea and vomiting)   . Sciatica of right side    going to see physiciatry    SURGICAL HISTORY: Past Surgical History:  Procedure Laterality Date  . COLONOSCOPY WITH PROPOFOL N/A 01/26/2016   Procedure: COLONOSCOPY WITH PROPOFOL;  Surgeon: Lollie Sails, MD;  Location: [facility] ENDOSCOPY;  Service: Endoscopy;  Laterality: N/A;  . COLONOSCOPY WITH PROPOFOL N/A 01/27/2016   Procedure: COLONOSCOPY WITH PROPOFOL;  Surgeon: Lollie Sails, MD;  Location: [facility] ENDOSCOPY;  Service: Endoscopy;  Laterality: N/A;  . CORONARY ARTERY BYPASS GRAFT  2013  . INCISION AND DRAINAGE     chest abscess  . INCISION AND DRAINAGE ABSCESS N/A 12/11/2018   Procedure: INCISION AND DRAINAGE PERINEAL;  Surgeon: Jules Husbands, MD;  Location: [facility] ORS;  Service: General;  Laterality: N/A;  . RETINAL LASER PROCEDURE      SOCIAL HISTORY: Social History   Socioeconomic History  . Marital status: Married    Spouse name: Not on file  . Number of children: Not on file  . Years of education: Not on file  . Highest education level: Not on file  Occupational History  . Not on file  Tobacco Use  . Smoking status: Never Smoker  . Smokeless  tobacco: Never Used  Vaping Use  . Vaping Use: Never used  Substance and Sexual Activity  . Alcohol use: No  . Drug use: No  . Sexual activity: Not Currently  Other Topics Concern  . Not on file  Social History Narrative  . Not on file   Social Determinants of Health   Financial Resource Strain: Not on file  Food Insecurity: Not on file  Transportation  Needs: Not on file  Physical Activity: Not on file  Stress: Not on file  Social Connections: Not on file  Intimate Partner Violence: Not on file    FAMILY HISTORY: Family History  Problem Relation Age of Onset  . Breast cancer Mother   . Prostate cancer Father        we think mets to liver and bone    ALLERGIES:  is allergic to ozempic (0.25 or 0.5 mg-dose) [semaglutide(0.25 or 0.1BP-ZWC)] and trulicity [dulaglutide].  MEDICATIONS:  Current Outpatient Medications  Medication Sig Dispense Refill  . allopurinol (ZYLOPRIM) 300 MG tablet Take 1 tablet by mouth daily.    [name] amLODipine (NORVASC) 10 MG tablet Take 10 mg by mouth at bedtime.    [name] aspirin EC 81 MG tablet Take 81 mg by mouth every morning.    . calcitRIOL (ROCALTROL) 0.25 MCG capsule Take by mouth.    . clopidogrel (PLAVIX) 75 MG tablet Take 1 tablet (75 mg total) by mouth daily. 30 tablet 0  . furosemide (LASIX) 20 MG tablet Take 1 tablet (20 mg total) by mouth every other day. 30 tablet 0  . hydrALAZINE (APRESOLINE) 25 MG tablet Take 1 tablet (25 mg total) by mouth 2 (two) times daily at 10 AM and 5 PM. (Patient taking differently: Take 50 mg by mouth 2 (two) times daily at 10 AM and 5 PM.) 60 tablet 0  . insulin glargine (LANTUS SOLOSTAR) 100 UNIT/ML Solostar Pen Inject 57 Units into the skin at bedtime. (Patient taking differently: Inject 70 Units into the skin at bedtime.) 15 mL 11  . insulin lispro (HUMALOG) 100 UNIT/ML KwikPen Inject 15-30 Units into the skin See admin instructions. Pt. Uses 15 units/ml in the mornings, 30units/ml in the afternoon, and 40 units/ml at bedtime    . isosorbide mononitrate (IMDUR) 30 MG 24 hr tablet Take 1 tablet (30 mg total) by mouth daily. 30 tablet 0  . metoprolol tartrate (LOPRESSOR) 50 MG tablet Take 50 mg by mouth 2 (two) times daily.     . rosuvastatin (CRESTOR) 20 MG tablet Take 20 mg by mouth at bedtime.     [name] losartan (COZAAR) 100 MG tablet Take by mouth.     No current  facility-administered medications for this visit.      PHYSICAL EXAMINATION:   Vitals:   11/28/20 1312  BP: 112/61  Pulse: 61  Temp: (!) 97.1 F (36.2 C)  SpO2: 98%   Filed Weights   11/28/20 1312  Weight: 261 lb 9.6 oz (118.7 kg)    Physical Exam Constitutional:      Comments: Obese.  Alone.  Walk independently.  HENT:     Head: Normocephalic and atraumatic.     Mouth/Throat:     Pharynx: No oropharyngeal exudate.  Eyes:     Pupils: Pupils are equal, round, and reactive to light.  Cardiovascular:     Rate and Rhythm: Normal rate and regular rhythm.  Pulmonary:     Effort: Pulmonary effort is normal. No respiratory distress.     Breath sounds: Normal  breath sounds. No wheezing.  Abdominal:     General: Bowel sounds are normal. There is no distension.     Palpations: Abdomen is soft. There is no mass.     Tenderness: There is no abdominal tenderness. There is no guarding or rebound.  Musculoskeletal:        General: No tenderness. Normal range of motion.     Cervical back: Normal range of motion and neck supple.  Skin:    General: Skin is warm.  Neurological:     Mental Status: She is alert and oriented to person, place, and time.  Psychiatric:        Mood and Affect: Affect normal.     LABORATORY DATA:  I have reviewed the data as listed Lab Results  Component Value Date   WBC 10.1 11/28/2020   HGB 9.9 (L) 11/28/2020   HCT 29.4 (L) 11/28/2020   MCV 89.4 11/28/2020   PLT 265 11/28/2020   Recent Labs    05/22/20 0427 05/23/20 0522 05/24/20 0520 07/30/20 1351 08/01/20 1455 09/12/20 1331 11/28/20 1303  NA 134* 136 138 142 139 142 142  K 3.5 3.6 3.7 3.9 4.1 4.0 4.5  CL 101 99 102 106 104 107 108  CO2 22 23 21* 23 21* 22 20*  GLUCOSE 260* 235* 192* 218* 159* 165* 94  BUN 81* 98* 98* 57* 60* 60* 80*  CREATININE 4.01* 3.89* 3.54* 3.12* 3.13* 3.02* 3.53*  CALCIUM 8.9 9.2 9.1 9.4 9.2 9.0 9.3  GFRNONAA 12* 12* 13* 16* 16* 16* 14*  GFRAA 13* 14* 16*  18* 18* 19*  --   PROT 7.2 7.7 7.2  --   --   --   --   ALBUMIN 3.2* 3.5 3.3*  --   --   --   --   AST _0 --   --   --   --   ALT 34 34 29  --   --   --   --   ALKPHOS 61 73 68  --   --   --   --   BILITOT 0.6 0.8 0.6  --   --   --   --      No results found.  Anemia of chronic kidney failure, stage 4 (severe) (HCC) #Severe anemia hemoglobin 7-8 [June 2021 iron saturation 18%]-symptomatic iron deficiency/secondary CKD-IV. Improved- Hb today 9.9-  proceed with Ferrahem infusion today.  Discussed the role of Retacrit if hemoglobin drops even after iron infusions.  #CKD stage IV-secondary diabetes hypertension-followed by Dr. Juleen China.  #CHF-chronic compensated-on diuretics.  # DISPOSITION:. # Ferrahem infusion today.  # in 1 month- cbc/ferrahem # follow up in 3 months-MD; labs- cbc/bmp;iron studies/ferritin-Ferrahem- Dr.B    All questions were answered. The patient knows to call the clinic with any problems, questions or concerns.    Cammie Sickle, MD 11/28/2020 3:12 PM

## 2020-12-24 LAB — COLOGUARD: COLOGUARD: NEGATIVE

## 2020-12-26 ENCOUNTER — Inpatient Hospital Stay: Payer: Medicare Other | Attending: Internal Medicine

## 2020-12-26 ENCOUNTER — Other Ambulatory Visit: Payer: Self-pay | Admitting: *Deleted

## 2020-12-26 ENCOUNTER — Inpatient Hospital Stay: Payer: Medicare Other

## 2020-12-26 VITALS — BP 122/58 | HR 58 | Temp 98.3°F | Resp 20

## 2020-12-26 DIAGNOSIS — E611 Iron deficiency: Secondary | ICD-10-CM | POA: Diagnosis present

## 2020-12-26 DIAGNOSIS — D631 Anemia in chronic kidney disease: Secondary | ICD-10-CM

## 2020-12-26 DIAGNOSIS — N184 Chronic kidney disease, stage 4 (severe): Secondary | ICD-10-CM

## 2020-12-26 LAB — CBC WITH DIFFERENTIAL/PLATELET
Abs Immature Granulocytes: 0.04 10*3/uL (ref 0.00–0.07)
Basophils Absolute: 0.1 10*3/uL (ref 0.0–0.1)
Basophils Relative: 1 %
Eosinophils Absolute: 0.5 10*3/uL (ref 0.0–0.5)
Eosinophils Relative: 5 %
HCT: 28.3 % — ABNORMAL LOW (ref 36.0–46.0)
Hemoglobin: 9.5 g/dL — ABNORMAL LOW (ref 12.0–15.0)
Immature Granulocytes: 0 %
Lymphocytes Relative: 19 %
Lymphs Abs: 1.9 10*3/uL (ref 0.7–4.0)
MCH: 30.7 pg (ref 26.0–34.0)
MCHC: 33.6 g/dL (ref 30.0–36.0)
MCV: 91.6 fL (ref 80.0–100.0)
Monocytes Absolute: 1.1 10*3/uL — ABNORMAL HIGH (ref 0.1–1.0)
Monocytes Relative: 11 %
Neutro Abs: 6.4 10*3/uL (ref 1.7–7.7)
Neutrophils Relative %: 64 %
Platelets: 234 10*3/uL (ref 150–400)
RBC: 3.09 MIL/uL — ABNORMAL LOW (ref 3.87–5.11)
RDW: 14.5 % (ref 11.5–15.5)
WBC: 10 10*3/uL (ref 4.0–10.5)
nRBC: 0 % (ref 0.0–0.2)

## 2020-12-26 MED ORDER — SODIUM CHLORIDE 0.9 % IV SOLN
510.0000 mg | Freq: Once | INTRAVENOUS | Status: AC
  Administered 2020-12-26: 510 mg via INTRAVENOUS
  Filled 2020-12-26: qty 17

## 2020-12-26 MED ORDER — SODIUM CHLORIDE 0.9 % IV SOLN
Freq: Once | INTRAVENOUS | Status: AC
  Filled 2020-12-26: qty 250

## 2021-01-12 ENCOUNTER — Other Ambulatory Visit: Payer: Self-pay

## 2021-01-12 ENCOUNTER — Ambulatory Visit (INDEPENDENT_AMBULATORY_CARE_PROVIDER_SITE_OTHER): Payer: Medicare Other | Admitting: Vascular Surgery

## 2021-01-12 ENCOUNTER — Encounter (INDEPENDENT_AMBULATORY_CARE_PROVIDER_SITE_OTHER): Payer: Self-pay | Admitting: Vascular Surgery

## 2021-01-12 ENCOUNTER — Encounter (INDEPENDENT_AMBULATORY_CARE_PROVIDER_SITE_OTHER): Payer: Medicare Other | Admitting: Vascular Surgery

## 2021-01-12 VITALS — BP 113/64 | HR 60 | Ht 67.0 in | Wt 264.0 lb

## 2021-01-12 DIAGNOSIS — E785 Hyperlipidemia, unspecified: Secondary | ICD-10-CM | POA: Diagnosis not present

## 2021-01-12 DIAGNOSIS — D631 Anemia in chronic kidney disease: Secondary | ICD-10-CM

## 2021-01-12 DIAGNOSIS — I10 Essential (primary) hypertension: Secondary | ICD-10-CM

## 2021-01-12 DIAGNOSIS — E1121 Type 2 diabetes mellitus with diabetic nephropathy: Secondary | ICD-10-CM

## 2021-01-12 DIAGNOSIS — N184 Chronic kidney disease, stage 4 (severe): Secondary | ICD-10-CM

## 2021-01-12 DIAGNOSIS — E042 Nontoxic multinodular goiter: Secondary | ICD-10-CM | POA: Insufficient documentation

## 2021-01-12 DIAGNOSIS — E1140 Type 2 diabetes mellitus with diabetic neuropathy, unspecified: Secondary | ICD-10-CM | POA: Insufficient documentation

## 2021-01-12 DIAGNOSIS — I700 Atherosclerosis of aorta: Secondary | ICD-10-CM | POA: Insufficient documentation

## 2021-01-12 NOTE — H&P (View-Only) (Signed)
MRN : [id number]  Sharon Arias is a 60 y.o. (01-27-1961) female who presents with chief complaint of  Chief Complaint  Patient presents with  . New Patient (Initial Visit)    Chronic kidney diease stage 4   .  History of Present Illness:   The patient is seen for evaluation for dialysis access. The patient has chronic renal insufficiency stage IV secondary to hypertension and diabetes. The patient's most recent creatinine clearance is less than 20. The patient volume status has not yet become an issue. Patient's blood pressures been relatively well controlled. There are mild uremic symptoms which appear to be relatively well tolerated at this time. The patient is right-handed.  The patient has been considering the various methods of dialysis and wishes to proceed with hemodialysis and therefore creation of AV access.  The patient denies amaurosis fugax or recent TIA symptoms. There are no recent neurological changes noted. The patient denies claudication symptoms or rest pain symptoms. The patient denies history of DVT, PE or superficial thrombophlebitis. The patient denies recent episodes of angina or shortness of breath.   Current Meds  Medication Sig  . allopurinol (ZYLOPRIM) 300 MG tablet Take 1 tablet by mouth daily.  [name] amLODipine (NORVASC) 10 MG tablet Take 10 mg by mouth at bedtime.  [name] aspirin EC 81 MG tablet Take 81 mg by mouth every morning.  . calcitRIOL (ROCALTROL) 0.25 MCG capsule Take by mouth.  . clopidogrel (PLAVIX) 75 MG tablet Take 1 tablet (75 mg total) by mouth daily.  . furosemide (LASIX) 20 MG tablet Take 1 tablet (20 mg total) by mouth every other day.  . hydrALAZINE (APRESOLINE) 25 MG tablet Take 1 tablet (25 mg total) by mouth 2 (two) times daily at 10 AM and 5 PM. (Patient taking differently: Take 50 mg by mouth 2 (two) times daily at 10 AM and 5 PM.)  . insulin glargine (LANTUS SOLOSTAR) 100 UNIT/ML Solostar Pen Inject 57 Units into the skin at bedtime.  (Patient taking differently: Inject 70 Units into the skin at bedtime.)  . insulin lispro (HUMALOG) 100 UNIT/ML KwikPen Inject 15-30 Units into the skin See admin instructions. Pt. Uses 15 units/ml in the mornings, 30units/ml in the afternoon, and 40 units/ml at bedtime  . isosorbide mononitrate (IMDUR) 30 MG 24 hr tablet Take 1 tablet (30 mg total) by mouth daily.  [name] losartan (COZAAR) 100 MG tablet Take by mouth.  . metoprolol tartrate (LOPRESSOR) 50 MG tablet Take 50 mg by mouth 2 (two) times daily.   . rosuvastatin (CRESTOR) 20 MG tablet Take 20 mg by mouth at bedtime.     Past Medical History:  Diagnosis Date  . Anemia in chronic kidney disease 05/19/2016  . Blind   . CAD (coronary artery disease)   . CHF (congestive heart failure) ([place])   . Chicken pox   . Chronic kidney disease    per dr sparks  . CVA (cerebral vascular accident) ([place])   . Detached retina   . Diabetes mellitus without complication ([place])    type 2  . Diabetic neuropathy ([place])   . Diabetic retinopathy ([place])    legally blind  . Hyperlipidemia   . Hypertension   . Myocardial infarction ([place])   . Obesity   . PONV (postoperative nausea and vomiting)   . Sciatica of right side    going to see physiciatry    Past Surgical History:  Procedure Laterality Date  . COLONOSCOPY WITH PROPOFOL N/A 01/26/2016  Procedure: COLONOSCOPY WITH PROPOFOL;  Surgeon: Lollie Sails, MD;  Location: [facility] ENDOSCOPY;  Service: Endoscopy;  Laterality: N/A;  . COLONOSCOPY WITH PROPOFOL N/A 01/27/2016   Procedure: COLONOSCOPY WITH PROPOFOL;  Surgeon: Lollie Sails, MD;  Location: [facility] ENDOSCOPY;  Service: Endoscopy;  Laterality: N/A;  . CORONARY ARTERY BYPASS GRAFT  2013  . INCISION AND DRAINAGE     chest abscess  . INCISION AND DRAINAGE ABSCESS N/A 12/11/2018   Procedure: INCISION AND DRAINAGE PERINEAL;  Surgeon: Jules Husbands, MD;  Location: [facility] ORS;  Service: General;  Laterality: N/A;  . RETINAL LASER PROCEDURE       Social History Social History   Tobacco Use  . Smoking status: Never Smoker  . Smokeless tobacco: Never Used  Vaping Use  . Vaping Use: Never used  Substance Use Topics  . Alcohol use: No  . Drug use: No    Family History Family History  Problem Relation Age of Onset  . Breast cancer Mother   . Prostate cancer Father        we think mets to liver and bone  No family history of bleeding/clotting disorders, porphyria or autoimmune disease   Allergies  Allergen Reactions  . Ozempic (0.25 Or 0.5 Mg-Dose) [Semaglutide(0.25 Or 0.5mg -Dos)] Diarrhea and Nausea Only  . Trulicity [Dulaglutide] Diarrhea and Nausea Only     REVIEW OF SYSTEMS (Negative unless checked)  Constitutional: [] Weight loss  [] Fever  [] Chills Cardiac: [] Chest pain   [] Chest pressure   [] Palpitations   [] Shortness of breath when laying flat   [] Shortness of breath with exertion. Vascular:  [] Pain in legs with walking   [] Pain in legs at rest  [] History of DVT   [] Phlebitis   [] Swelling in legs   [] Varicose veins   [] Non-healing ulcers Pulmonary:   [] Uses home oxygen   [] Productive cough   [] Hemoptysis   [] Wheeze  [] COPD   [] Asthma Neurologic:  [] Dizziness   [] Seizures   [] History of stroke   [] History of TIA  [] Aphasia   [] Vissual changes   [] Weakness or numbness in arm   [] Weakness or numbness in leg Musculoskeletal:   [] Joint swelling   [] Joint pain   [] Low back pain Hematologic:  [] Easy bruising  [] Easy bleeding   [] Hypercoagulable state   [] Anemic Gastrointestinal:  [] Diarrhea   [] Vomiting  [x] Gastroesophageal reflux/heartburn   [] Difficulty swallowing. Genitourinary:  [x] Chronic kidney disease   [] Difficult urination  [] Frequent urination   [] Blood in urine Skin:  [] Rashes   [] Ulcers  Psychological:  [] History of anxiety   []  History of major depression.  Physical Examination  Vitals:   01/12/21 1443  BP: 113/64  Pulse: 60  Weight: 264 lb (119.7 kg)  Height: 5\' 7"  (1.702 m)   Body mass index  is 41.35 kg/m. Gen: WD/WN, NAD Head: [place]/AT, No temporalis wasting.  Ear/Nose/Throat: Hearing grossly intact, nares w/o erythema or drainage, poor dentition Eyes: PER, EOMI, sclera nonicteric.  Neck: Supple, no masses.  No bruit or JVD.  Pulmonary:  Good air movement, clear to auscultation bilaterally, no use of accessory muscles.  Cardiac: RRR, normal S1, S2, no Murmurs. Vascular: Superficial veins of the upper extremities are not easily Vill visualized however on the left the vein is palpable at the level of the antecubital fossa Vessel Right Left  Radial Palpable Palpable  Ulnar  not palpable  not palpable  Brachial Palpable Palpable  Gastrointestinal: soft, non-distended. No guarding/no peritoneal signs.  Musculoskeletal: M/S 5/5 throughout.  No deformity or atrophy.  Neurologic: CN 2-12 intact. Pain and light touch intact in extremities.  Symmetrical.  Speech is fluent. Motor exam as listed above. Psychiatric: Judgment intact, Mood & affect appropriate for pt's clinical situation. Dermatologic: No rashes or ulcers noted.  No changes consistent with cellulitis.  CBC Lab Results  Component Value Date   WBC 10.0 12/26/2020   HGB 9.5 (L) 12/26/2020   HCT 28.3 (L) 12/26/2020   MCV 91.6 12/26/2020   PLT 234 12/26/2020    BMET    Component Value Date/Time   NA 142 11/28/2020 1303   K 4.5 11/28/2020 1303   CL 108 11/28/2020 1303   CO2 20 (L) 11/28/2020 1303   GLUCOSE 94 11/28/2020 1303   BUN 80 (H) 11/28/2020 1303   CREATININE 3.53 (H) 11/28/2020 1303   CALCIUM 9.3 11/28/2020 1303   GFRNONAA 14 (L) 11/28/2020 1303   GFRAA 19 (L) 09/12/2020 1331   CrCl cannot be calculated (Patient's most recent lab result is older than the maximum 21 days allowed.).  COAG Lab Results  Component Value Date   INR 1.2 03/18/2020    Radiology No results found.   Assessment/Plan 1. Anemia of chronic kidney failure, stage 4 (severe) (HCC) Recommend:  At this time the patient does not  have appropriate extremity access for dialysis  Patient should have a left brachiocephalic fistula created.  The risks, benefits and alternative therapies were reviewed in detail with the patient.  All questions were answered.  The patient agrees to proceed with surgery.    2. Primary hypertension Continue antihypertensive medications as already ordered, these medications have been reviewed and there are no changes at this time.   3. Type 2 diabetes mellitus with diabetic nephropathy, without long-term current use of insulin (HCC) Continue hypoglycemic medications as already ordered, these medications have been reviewed and there are no changes at this time.  Hgb A1C to be monitored as already arranged by primary service   4. Hyperlipidemia, unspecified hyperlipidemia type Continue statin as ordered and reviewed, no changes at this time    [name], MD  01/12/2021 2:55 PM

## 2021-01-12 NOTE — H&P (View-Only) (Signed)
MRN : [id number]  Sharon Arias is a 60 y.o. ([birth year]) female who presents with chief complaint of  Chief Complaint  Patient presents with  . New Patient (Initial Visit)    Chronic kidney diease stage 4   .  History of Present Illness:   The patient is seen for evaluation for dialysis access. The patient has chronic renal insufficiency stage IV secondary to hypertension and diabetes. The patient's most recent creatinine clearance is less than 20. The patient volume status has not yet become an issue. Patient's blood pressures been relatively well controlled. There are mild uremic symptoms which appear to be relatively well tolerated at this time. The patient is right-handed.  The patient has been considering the various methods of dialysis and wishes to proceed with hemodialysis and therefore creation of AV access.  The patient denies amaurosis fugax or recent TIA symptoms. There are no recent neurological changes noted. The patient denies claudication symptoms or rest pain symptoms. The patient denies history of DVT, PE or superficial thrombophlebitis. The patient denies recent episodes of angina or shortness of breath.   Current Meds  Medication Sig  . allopurinol (ZYLOPRIM) 300 MG tablet Take 1 tablet by mouth daily.  [name] amLODipine (NORVASC) 10 MG tablet Take 10 mg by mouth at bedtime.  [name] aspirin EC 81 MG tablet Take 81 mg by mouth every morning.  . calcitRIOL (ROCALTROL) 0.25 MCG capsule Take by mouth.  . clopidogrel (PLAVIX) 75 MG tablet Take 1 tablet (75 mg total) by mouth daily.  . furosemide (LASIX) 20 MG tablet Take 1 tablet (20 mg total) by mouth every other day.  . hydrALAZINE (APRESOLINE) 25 MG tablet Take 1 tablet (25 mg total) by mouth 2 (two) times daily at 10 AM and 5 PM. (Patient taking differently: Take 50 mg by mouth 2 (two) times daily at 10 AM and 5 PM.)  . insulin glargine (LANTUS SOLOSTAR) 100 UNIT/ML Solostar Pen Inject 57 Units into the skin at bedtime.  (Patient taking differently: Inject 70 Units into the skin at bedtime.)  . insulin lispro (HUMALOG) 100 UNIT/ML KwikPen Inject 15-30 Units into the skin See admin instructions. Pt. Uses 15 units/ml in the mornings, 30units/ml in the afternoon, and 40 units/ml at bedtime  . isosorbide mononitrate (IMDUR) 30 MG 24 hr tablet Take 1 tablet (30 mg total) by mouth daily.  [name] losartan (COZAAR) 100 MG tablet Take by mouth.  . metoprolol tartrate (LOPRESSOR) 50 MG tablet Take 50 mg by mouth 2 (two) times daily.   . rosuvastatin (CRESTOR) 20 MG tablet Take 20 mg by mouth at bedtime.     Past Medical History:  Diagnosis Date  . Anemia in chronic kidney disease 05/19/2016  . Blind   . CAD (coronary artery disease)   . CHF (congestive heart failure) ([place])   . Chicken pox   . Chronic kidney disease    per dr sparks  . CVA (cerebral vascular accident) ([place])   . Detached retina   . Diabetes mellitus without complication ([place])    type 2  . Diabetic neuropathy ([place])   . Diabetic retinopathy ([place])    legally blind  . Hyperlipidemia   . Hypertension   . Myocardial infarction ([place])   . Obesity   . PONV (postoperative nausea and vomiting)   . Sciatica of right side    going to see physiciatry    Past Surgical History:  Procedure Laterality Date  . COLONOSCOPY WITH PROPOFOL N/A 01/26/2016  Procedure: COLONOSCOPY WITH PROPOFOL;  Surgeon: Lollie Sails, MD;  Location: [facility] ENDOSCOPY;  Service: Endoscopy;  Laterality: N/A;  . COLONOSCOPY WITH PROPOFOL N/A 01/27/2016   Procedure: COLONOSCOPY WITH PROPOFOL;  Surgeon: Lollie Sails, MD;  Location: [facility] ENDOSCOPY;  Service: Endoscopy;  Laterality: N/A;  . CORONARY ARTERY BYPASS GRAFT  2013  . INCISION AND DRAINAGE     chest abscess  . INCISION AND DRAINAGE ABSCESS N/A 12/11/2018   Procedure: INCISION AND DRAINAGE PERINEAL;  Surgeon: Jules Husbands, MD;  Location: [facility] ORS;  Service: General;  Laterality: N/A;  . RETINAL LASER PROCEDURE       Social History Social History   Tobacco Use  . Smoking status: Never Smoker  . Smokeless tobacco: Never Used  Vaping Use  . Vaping Use: Never used  Substance Use Topics  . Alcohol use: No  . Drug use: No    Family History Family History  Problem Relation Age of Onset  . Breast cancer Mother   . Prostate cancer Father        we think mets to liver and bone  No family history of bleeding/clotting disorders, porphyria or autoimmune disease   Allergies  Allergen Reactions  . Ozempic (0.25 Or 0.5 Mg-Dose) [Semaglutide(0.25 Or 0.5mg -Dos)] Diarrhea and Nausea Only  . Trulicity [Dulaglutide] Diarrhea and Nausea Only     REVIEW OF SYSTEMS (Negative unless checked)  Constitutional: [] Weight loss  [] Fever  [] Chills Cardiac: [] Chest pain   [] Chest pressure   [] Palpitations   [] Shortness of breath when laying flat   [] Shortness of breath with exertion. Vascular:  [] Pain in legs with walking   [] Pain in legs at rest  [] History of DVT   [] Phlebitis   [] Swelling in legs   [] Varicose veins   [] Non-healing ulcers Pulmonary:   [] Uses home oxygen   [] Productive cough   [] Hemoptysis   [] Wheeze  [] COPD   [] Asthma Neurologic:  [] Dizziness   [] Seizures   [] History of stroke   [] History of TIA  [] Aphasia   [] Vissual changes   [] Weakness or numbness in arm   [] Weakness or numbness in leg Musculoskeletal:   [] Joint swelling   [] Joint pain   [] Low back pain Hematologic:  [] Easy bruising  [] Easy bleeding   [] Hypercoagulable state   [] Anemic Gastrointestinal:  [] Diarrhea   [] Vomiting  [x] Gastroesophageal reflux/heartburn   [] Difficulty swallowing. Genitourinary:  [x] Chronic kidney disease   [] Difficult urination  [] Frequent urination   [] Blood in urine Skin:  [] Rashes   [] Ulcers  Psychological:  [] History of anxiety   []  History of major depression.  Physical Examination  Vitals:   01/12/21 1443  BP: 113/64  Pulse: 60  Weight: 264 lb (119.7 kg)  Height: 5\' 7"  (1.702 m)   Body mass index  is 41.35 kg/m. Gen: WD/WN, NAD Head: [place]/AT, No temporalis wasting.  Ear/Nose/Throat: Hearing grossly intact, nares w/o erythema or drainage, poor dentition Eyes: PER, EOMI, sclera nonicteric.  Neck: Supple, no masses.  No bruit or JVD.  Pulmonary:  Good air movement, clear to auscultation bilaterally, no use of accessory muscles.  Cardiac: RRR, normal S1, S2, no Murmurs. Vascular: Superficial veins of the upper extremities are not easily Vill visualized however on the left the vein is palpable at the level of the antecubital fossa Vessel Right Left  Radial Palpable Palpable  Ulnar  not palpable  not palpable  Brachial Palpable Palpable  Gastrointestinal: soft, non-distended. No guarding/no peritoneal signs.  Musculoskeletal: M/S 5/5 throughout.  No deformity or atrophy.  Neurologic: CN 2-12 intact. Pain and light touch intact in extremities.  Symmetrical.  Speech is fluent. Motor exam as listed above. Psychiatric: Judgment intact, Mood & affect appropriate for pt's clinical situation. Dermatologic: No rashes or ulcers noted.  No changes consistent with cellulitis.  CBC Lab Results  Component Value Date   WBC 10.0 12/26/2020   HGB 9.5 (L) 12/26/2020   HCT 28.3 (L) 12/26/2020   MCV 91.6 12/26/2020   PLT 234 12/26/2020    BMET    Component Value Date/Time   NA 142 11/28/2020 1303   K 4.5 11/28/2020 1303   CL 108 11/28/2020 1303   CO2 20 (L) 11/28/2020 1303   GLUCOSE 94 11/28/2020 1303   BUN 80 (H) 11/28/2020 1303   CREATININE 3.53 (H) 11/28/2020 1303   CALCIUM 9.3 11/28/2020 1303   GFRNONAA 14 (L) 11/28/2020 1303   GFRAA 19 (L) 09/12/2020 1331   CrCl cannot be calculated (Patient's most recent lab result is older than the maximum 21 days allowed.).  COAG Lab Results  Component Value Date   INR 1.2 03/18/2020    Radiology No results found.   Assessment/Plan 1. Anemia of chronic kidney failure, stage 4 (severe) (HCC) Recommend:  At this time the patient does not  have appropriate extremity access for dialysis  Patient should have a left brachiocephalic fistula created.  The risks, benefits and alternative therapies were reviewed in detail with the patient.  All questions were answered.  The patient agrees to proceed with surgery.    2. Primary hypertension Continue antihypertensive medications as already ordered, these medications have been reviewed and there are no changes at this time.   3. Type 2 diabetes mellitus with diabetic nephropathy, without long-term current use of insulin (HCC) Continue hypoglycemic medications as already ordered, these medications have been reviewed and there are no changes at this time.  Hgb A1C to be monitored as already arranged by primary service   4. Hyperlipidemia, unspecified hyperlipidemia type Continue statin as ordered and reviewed, no changes at this time    [name], MD  01/12/2021 2:55 PM

## 2021-01-12 NOTE — Progress Notes (Signed)
MRN : [id number]  Sharon Arias is a 60 y.o. (May 26, 1961) female who presents with chief complaint of  Chief Complaint  Patient presents with  . New Patient (Initial Visit)    Chronic kidney diease stage 4   .  History of Present Illness:   The patient is seen for evaluation for dialysis access. The patient has chronic renal insufficiency stage IV secondary to hypertension and diabetes. The patient's most recent creatinine clearance is less than 20. The patient volume status has not yet become an issue. Patient's blood pressures been relatively well controlled. There are mild uremic symptoms which appear to be relatively well tolerated at this time. The patient is right-handed.  The patient has been considering the various methods of dialysis and wishes to proceed with hemodialysis and therefore creation of AV access.  The patient denies amaurosis fugax or recent TIA symptoms. There are no recent neurological changes noted. The patient denies claudication symptoms or rest pain symptoms. The patient denies history of DVT, PE or superficial thrombophlebitis. The patient denies recent episodes of angina or shortness of breath.   Current Meds  Medication Sig  . allopurinol (ZYLOPRIM) 300 MG tablet Take 1 tablet by mouth daily.  [name] amLODipine (NORVASC) 10 MG tablet Take 10 mg by mouth at bedtime.  [name] aspirin EC 81 MG tablet Take 81 mg by mouth every morning.  . calcitRIOL (ROCALTROL) 0.25 MCG capsule Take by mouth.  . clopidogrel (PLAVIX) 75 MG tablet Take 1 tablet (75 mg total) by mouth daily.  . furosemide (LASIX) 20 MG tablet Take 1 tablet (20 mg total) by mouth every other day.  . hydrALAZINE (APRESOLINE) 25 MG tablet Take 1 tablet (25 mg total) by mouth 2 (two) times daily at 10 AM and 5 PM. (Patient taking differently: Take 50 mg by mouth 2 (two) times daily at 10 AM and 5 PM.)  . insulin glargine (LANTUS SOLOSTAR) 100 UNIT/ML Solostar Pen Inject 57 Units into the skin at bedtime.  (Patient taking differently: Inject 70 Units into the skin at bedtime.)  . insulin lispro (HUMALOG) 100 UNIT/ML KwikPen Inject 15-30 Units into the skin See admin instructions. Pt. Uses 15 units/ml in the mornings, 30units/ml in the afternoon, and 40 units/ml at bedtime  . isosorbide mononitrate (IMDUR) 30 MG 24 hr tablet Take 1 tablet (30 mg total) by mouth daily.  [name] losartan (COZAAR) 100 MG tablet Take by mouth.  . metoprolol tartrate (LOPRESSOR) 50 MG tablet Take 50 mg by mouth 2 (two) times daily.   . rosuvastatin (CRESTOR) 20 MG tablet Take 20 mg by mouth at bedtime.     Past Medical History:  Diagnosis Date  . Anemia in chronic kidney disease 05/19/2016  . Blind   . CAD (coronary artery disease)   . CHF (congestive heart failure) ([place])   . Chicken pox   . Chronic kidney disease    per dr sparks  . CVA (cerebral vascular accident) ([place])   . Detached retina   . Diabetes mellitus without complication ([place])    type 2  . Diabetic neuropathy ([place])   . Diabetic retinopathy ([place])    legally blind  . Hyperlipidemia   . Hypertension   . Myocardial infarction ([place])   . Obesity   . PONV (postoperative nausea and vomiting)   . Sciatica of right side    going to see physiciatry    Past Surgical History:  Procedure Laterality Date  . COLONOSCOPY WITH PROPOFOL N/A 01/26/2016  Procedure: COLONOSCOPY WITH PROPOFOL;  Surgeon: Lollie Sails, MD;  Location: [facility] ENDOSCOPY;  Service: Endoscopy;  Laterality: N/A;  . COLONOSCOPY WITH PROPOFOL N/A 01/27/2016   Procedure: COLONOSCOPY WITH PROPOFOL;  Surgeon: Lollie Sails, MD;  Location: [facility] ENDOSCOPY;  Service: Endoscopy;  Laterality: N/A;  . CORONARY ARTERY BYPASS GRAFT  2013  . INCISION AND DRAINAGE     chest abscess  . INCISION AND DRAINAGE ABSCESS N/A 12/11/2018   Procedure: INCISION AND DRAINAGE PERINEAL;  Surgeon: Jules Husbands, MD;  Location: [facility] ORS;  Service: General;  Laterality: N/A;  . RETINAL LASER PROCEDURE       Social History Social History   Tobacco Use  . Smoking status: Never Smoker  . Smokeless tobacco: Never Used  Vaping Use  . Vaping Use: Never used  Substance Use Topics  . Alcohol use: No  . Drug use: No    Family History Family History  Problem Relation Age of Onset  . Breast cancer Mother   . Prostate cancer Father        we think mets to liver and bone  No family history of bleeding/clotting disorders, porphyria or autoimmune disease   Allergies  Allergen Reactions  . Ozempic (0.25 Or 0.5 Mg-Dose) [Semaglutide(0.25 Or 0.5mg -Dos)] Diarrhea and Nausea Only  . Trulicity [Dulaglutide] Diarrhea and Nausea Only     REVIEW OF SYSTEMS (Negative unless checked)  Constitutional: [] Weight loss  [] Fever  [] Chills Cardiac: [] Chest pain   [] Chest pressure   [] Palpitations   [] Shortness of breath when laying flat   [] Shortness of breath with exertion. Vascular:  [] Pain in legs with walking   [] Pain in legs at rest  [] History of DVT   [] Phlebitis   [] Swelling in legs   [] Varicose veins   [] Non-healing ulcers Pulmonary:   [] Uses home oxygen   [] Productive cough   [] Hemoptysis   [] Wheeze  [] COPD   [] Asthma Neurologic:  [] Dizziness   [] Seizures   [] History of stroke   [] History of TIA  [] Aphasia   [] Vissual changes   [] Weakness or numbness in arm   [] Weakness or numbness in leg Musculoskeletal:   [] Joint swelling   [] Joint pain   [] Low back pain Hematologic:  [] Easy bruising  [] Easy bleeding   [] Hypercoagulable state   [] Anemic Gastrointestinal:  [] Diarrhea   [] Vomiting  [x] Gastroesophageal reflux/heartburn   [] Difficulty swallowing. Genitourinary:  [x] Chronic kidney disease   [] Difficult urination  [] Frequent urination   [] Blood in urine Skin:  [] Rashes   [] Ulcers  Psychological:  [] History of anxiety   []  History of major depression.  Physical Examination  Vitals:   01/12/21 1443  BP: 113/64  Pulse: 60  Weight: 264 lb (119.7 kg)  Height: 5\' 7"  (1.702 m)   Body mass index  is 41.35 kg/m. Gen: WD/WN, NAD Head: [place]/AT, No temporalis wasting.  Ear/Nose/Throat: Hearing grossly intact, nares w/o erythema or drainage, poor dentition Eyes: PER, EOMI, sclera nonicteric.  Neck: Supple, no masses.  No bruit or JVD.  Pulmonary:  Good air movement, clear to auscultation bilaterally, no use of accessory muscles.  Cardiac: RRR, normal S1, S2, no Murmurs. Vascular: Superficial veins of the upper extremities are not easily [place] visualized however on the left the vein is palpable at the level of the antecubital fossa Vessel Right Left  Radial Palpable Palpable  Ulnar  not palpable  not palpable  Brachial Palpable Palpable  Gastrointestinal: soft, non-distended. No guarding/no peritoneal signs.  Musculoskeletal: M/S 5/5 throughout.  No deformity or atrophy.  Neurologic: CN 2-12 intact. Pain and light touch intact in extremities.  Symmetrical.  Speech is fluent. Motor exam as listed above. Psychiatric: Judgment intact, Mood & affect appropriate for pt's clinical situation. Dermatologic: No rashes or ulcers noted.  No changes consistent with cellulitis.  CBC Lab Results  Component Value Date   WBC 10.0 12/26/2020   HGB 9.5 (L) 12/26/2020   HCT 28.3 (L) 12/26/2020   MCV 91.6 12/26/2020   PLT 234 12/26/2020    BMET    Component Value Date/Time   NA 142 11/28/2020 1303   K 4.5 11/28/2020 1303   CL 108 11/28/2020 1303   CO2 20 (L) 11/28/2020 1303   GLUCOSE 94 11/28/2020 1303   BUN 80 (H) 11/28/2020 1303   CREATININE 3.53 (H) 11/28/2020 1303   CALCIUM 9.3 11/28/2020 1303   GFRNONAA 14 (L) 11/28/2020 1303   GFRAA 19 (L) 09/12/2020 1331   CrCl cannot be calculated (Patient's most recent lab result is older than the maximum 21 days allowed.).  COAG Lab Results  Component Value Date   INR 1.2 03/18/2020    Radiology No results found.   Assessment/Plan 1. Anemia of chronic kidney failure, stage 4 (severe) (HCC) Recommend:  At this time the patient does not  have appropriate extremity access for dialysis  Patient should have a left brachiocephalic fistula created.  The risks, benefits and alternative therapies were reviewed in detail with the patient.  All questions were answered.  The patient agrees to proceed with surgery.    2. Primary hypertension Continue antihypertensive medications as already ordered, these medications have been reviewed and there are no changes at this time.   3. Type 2 diabetes mellitus with diabetic nephropathy, without long-term current use of insulin (HCC) Continue hypoglycemic medications as already ordered, these medications have been reviewed and there are no changes at this time.  Hgb A1C to be monitored as already arranged by primary service   4. Hyperlipidemia, unspecified hyperlipidemia type Continue statin as ordered and reviewed, no changes at this time    [name], MD  01/12/2021 2:55 PM

## 2021-01-13 ENCOUNTER — Encounter (INDEPENDENT_AMBULATORY_CARE_PROVIDER_SITE_OTHER): Payer: Self-pay | Admitting: Vascular Surgery

## 2021-01-14 ENCOUNTER — Telehealth (INDEPENDENT_AMBULATORY_CARE_PROVIDER_SITE_OTHER): Payer: Self-pay

## 2021-01-14 NOTE — Telephone Encounter (Signed)
I attempted to contact the patient and a message was left for a return call. 

## 2021-01-15 NOTE — Telephone Encounter (Signed)
Spoke with the patient and she is scheduled with Dr. Delana Meyer for a left brachial cephalic AF fistula on 63/14/97 at the [facility]. Phone call pre-op on 01/27/21 between 8-1 pm and covid testing on 02/04/21 between 8-1 pm at the [facility]. Pre-surgical instructions were discussed and will be mailed.

## 2021-01-26 ENCOUNTER — Other Ambulatory Visit (INDEPENDENT_AMBULATORY_CARE_PROVIDER_SITE_OTHER): Payer: Self-pay | Admitting: Nurse Practitioner

## 2021-01-27 ENCOUNTER — Encounter
Admission: RE | Admit: 2021-01-27 | Discharge: 2021-01-27 | Disposition: A | Discharge status: Home / Self Care | Payer: Medicare Other | Source: Ambulatory Visit | Attending: Vascular Surgery | Admitting: Vascular Surgery

## 2021-01-27 ENCOUNTER — Other Ambulatory Visit: Payer: Self-pay

## 2021-01-27 DIAGNOSIS — Z01818 Encounter for other preprocedural examination: Secondary | ICD-10-CM | POA: Insufficient documentation

## 2021-01-27 DIAGNOSIS — N185 Chronic kidney disease, stage 5: Secondary | ICD-10-CM | POA: Insufficient documentation

## 2021-01-27 HISTORY — DX: Anxiety disorder, unspecified: F41.9

## 2021-01-27 HISTORY — DX: Dyspnea, unspecified: R06.00

## 2021-01-27 HISTORY — DX: Pneumonia, unspecified organism: J18.9

## 2021-01-27 HISTORY — DX: Sleep apnea, unspecified: G47.30

## 2021-01-27 NOTE — Patient Instructions (Addendum)
Your procedure is scheduled on: 02/06/21- FRIDAY Report to the Registration Desk on the 1st floor of the [facility]. To find out your arrival time, please call [phone] between 1PM - 3PM on: 02/05/21- Thursday MEDICAL MALL FOR LABS AND EKG-  AT [place] TEST AND BAG ON 02/04/21- BETWEEN 8AM - 1PM.  REMEMBER: Instructions that are not followed completely may result in serious medical risk, up to and including death; or upon the discretion of your surgeon and anesthesiologist your surgery may need to be rescheduled.  Do not eat food after midnight the night before surgery.  No gum chewing, lozengers or hard candies.  You may however, drink CLEAR liquids up to 2 hours before you are scheduled to arrive for your surgery. Do not drink anything within 2 hours of your scheduled arrival time. Type 1 and Type 2 diabetics should only drink water.  TAKE THESE MEDICATIONS THE MORNING OF SURGERY WITH A SIP OF WATER: - allopurinol (ZYLOPRIM) 300 MG tablet - amLODipine (NORVASC) 10 MG tablet - hydrALAZINE (APRESOLINE) 25 MG tablet - isosorbide mononitrate (IMDUR) 30 MG 24 hr tablet - metoprolol tartrate (LOPRESSOR) 50 MG tablet  Take 1/2 of usual DOSE OF insulin glargine (LANTUS SOLOSTAR) 100 UNIT/ML Solostar Pen .  TAKE NO insulin lispro (HUMALOG) 100 UNIT/ML KwikPen -  on the morning of surgery.  Follow recommendations from Cardiologist, Pulmonologist or PCP regarding stopping Aspirin, Coumadin, Plavix, Eliquis, Pradaxa, or Pletal. STOP TAKING PLAVIX STARTING ON 01/31/21.  One week prior to surgery: STOP TAKING 01/30/21. Stop Anti-inflammatories (NSAIDS) such as Advil, Aleve, Ibuprofen, Motrin, Naproxen, Naprosyn and Aspirin based products such as Excedrin, [place]ys Powder, BC Powder.  Stop ANY OVER THE COUNTER supplements until after surgery. STOP TAKING 01/30/21. (However, you may continue taking Vitamin D, Vitamin B, and multivitamin up until the day before  surgery.)  No Alcohol for 24 hours before or after surgery.  No Smoking including e-cigarettes for 24 hours prior to surgery.  No chewable tobacco products for at least 6 hours prior to surgery.  No nicotine patches on the day of surgery.  Do not use any "recreational" drugs for at least a week prior to your surgery.  Please be advised that the combination of cocaine and anesthesia may have negative outcomes, up to and including death. If you test positive for cocaine, your surgery will be cancelled.  On the morning of surgery brush your teeth with toothpaste and water, you may rinse your mouth with mouthwash if you wish. Do not swallow any toothpaste or mouthwash.  Do not wear jewelry, make-up, hairpins, clips or nail polish.  Do not wear lotions, powders, or perfumes.   Do not shave body from the neck down 48 hours prior to surgery just in case you cut yourself which could leave a site for infection.  Also, freshly shaved skin may become irritated if using the CHG soap.  Contact lenses, hearing aids and dentures may not be worn into surgery.  Do not bring valuables to the hospital. [facility] is not responsible for any missing/lost belongings or valuables.    BRING YOUR CPAP WITH YOU TO [place].  Use CHG Soap or wipes as directed on instruction sheet.  Notify your doctor if there is any change in your medical condition (cold, fever, infection).  Wear comfortable clothing (specific to your surgery type) to the hospital.  Plan for stool softeners for home use; pain medications have a tendency to cause constipation. You can  also help prevent constipation by eating foods high in fiber such as fruits and vegetables and drinking plenty of fluids as your diet allows.  After surgery, you can help prevent lung complications by doing breathing exercises.  Take deep breaths and cough every 1-2 hours. Your doctor may order a device called an Incentive Spirometer to help you take  deep breaths. When coughing or sneezing, hold a pillow firmly against your incision with both hands. This is called "splinting." Doing this helps protect your incision. It also decreases belly discomfort.  If you are being admitted to the hospital overnight, leave your suitcase in the car. After surgery it may be brought to your room.  If you are being discharged the day of surgery, you will not be allowed to drive home. You will need a responsible adult (18 years or older) to drive you home and stay with you that night.   If you are taking public transportation, you will need to have a responsible adult (18 years or older) with you. Please confirm with your physician that it is acceptable to use public transportation.   Please call the [place] Dept. at [phone] if you have any questions about these instructions.  Visitation Policy:  Patients undergoing a surgery or procedure may have one family member or support person with them as long as that person is not COVID-19 positive or experiencing its symptoms.  That person may remain in the waiting area during the procedure.  Inpatient Visitation:    Visiting hours are 7 a.m. to 8 p.m. Patients will be allowed one visitor. The visitor may change daily. The visitor must pass COVID-19 screenings, use hand sanitizer when entering and exiting the patient's room and wear a mask at all times, including in the patient's room. Patients must also wear a mask when staff or their visitor are in the room. Masking is required regardless of vaccination status. Systemwide, no visitors 17 or younger.

## 2021-01-28 ENCOUNTER — Telehealth (INDEPENDENT_AMBULATORY_CARE_PROVIDER_SITE_OTHER): Payer: Self-pay

## 2021-01-28 NOTE — Telephone Encounter (Signed)
Spoke with the patient about getting a permcath placement. Patient is scheduled with Dr. Delana Meyer on 02/03/21 with a 10:00 am arrival time to the [facility]. Covid testing on 01/30/21 between 8-1 pm at the [facility]. Pre-procedure instructions were discussed and will be mailed.

## 2021-01-29 ENCOUNTER — Encounter: Payer: Self-pay | Admitting: Vascular Surgery

## 2021-01-29 NOTE — Progress Notes (Signed)
Perioperative Services  Pre-Admission/Anesthesia Testing Clinical Review  Date: 02/04/21  Patient Demographics:  Name: ONETTA SPAINHOWER DOB:   1961/11/28 MRN:   [id number]  Planned Surgical Procedure(s):    Case: [id number] Date/Time: 02/06/21 0715   Procedure: ARTERIOVENOUS (AV) FISTULA CREATION (BRACHIAL CEPHALIC ) (Left )   Anesthesia type: General   Pre-op diagnosis: ESRD   Location: [facility] OR ROOM 07 / [facility]ESTHESIA [facility]   Surgeons: Katha Cabal, MD    NOTE: Available PAT nursing documentation and vital signs have been reviewed. Clinical nursing staff has updated patient's PMH/PSHx, current medication list, and drug allergies/intolerances to ensure comprehensive history available to assist in medical decision making as it pertains to the aforementioned surgical procedure and anticipated anesthetic course.   Clinical Discussion:  KAEDANCE MAGOS is a 60 y.o. female who is submitted for pre-surgical anesthesia review and clearance prior to her undergoing the above procedure. Patient has never been a smoker. Pertinent PMH includes: CAD, NSTEMI, cardiac murmur, aortic atherosclerosis, CHF (grade 2 diastolic dysfunction), CVA, HTN, HLD, T2DM, ESRD needing dialysis, SOB/DOE, OSAH (requires nocturnal PAP therapy), anemia of chronic disease, diabetic retinopathy (legally blind), lumbar DDD, anxiety  Patient is followed by cardiology Saralyn Pilar, MD). She was last seen in the cardiology clinic on 12/22/2020; notes reviewed.  At the time of her clinic visit, patient reported that she was "doing okay".  Patient denied chest pain, however she reported chronic exertional dyspnea.  No PND, orthopnea, palpitations, peripheral edema, vertiginous symptoms, or presyncope/syncope.  Patient reported abdominal swelling in the setting of normal stage IV CKD patient followed by nephrology and has been referred to vascular surgery for access placement for initiation of hemodialysis.  Patient with a  significant cardiovascular history.  In 06/2012, patient suffered an NSTEMI that resulted in patient undergoing a three-vessel CABG (LIMA-LAD, SVG to OM1, and SVG to OM 2).  Subsequent myocardial perfusion imaging performed in 12/2016 for assessment of anginal equivalent symptoms revealed concerns for possible inferolateral ischemia.  Patient was taken back to the cardiac catheterization lab on 01/20/2017 at which time diagnostic studies revealed three-vessel CAD, patent LIMA to LAD graft and an 80% stenosis of the mid RCA, which was stented.  Of note, unable to visualize grafts to OM1 and OM2 despite multiple attempts; presumably reoccluded. Last TTE performed on 03/18/2020 revealed a normal left ventricular systolic function with an EF of 55 to 60%, G2DD, and mild valvular insufficiency (see full interpretation of cardiovascular testing below).  Patient admitted to [facility] and 07/2019 for vertiginous symptoms that was felt to be secondary to BPPV.  MRI imaging at that time revealed subacute right parietal stroke patient was started on DAPT (ASA + clopidogrel) therapy.  Patient remains on prescribed DAPT therapy; compliant with therapy with no evidence of increased bruising or bleeding.  Patient on GDMT for her HTN and HLD diagnoses.  Blood pressure well controlled at 138/60 on currently prescribed CCB (amlodipine), diuretic (furosemide), vasodilator (hydralazine), ARB (losartan), nitrate (isosorbide mononitrate), and beta-blocker (metoprolol tartrate) therapies.  Patient is on a statin for her HLD.  T2DM loosely controlled on currently prescribed regimen; Hgb A1c 8.9% when last checked on 06/11/2020. Functional capacity, as defined by DASI, is documented as being >/= 4 METS.  No changes were made to patient's medication regimen during this visit.  Patient to follow-up with outpatient cardiology in 6 months or sooner if needed.  Patient scheduled to undergo vascular access (AV graft) creation on 02/06/2021 with Dr.  [name].  Given patient's past medical history significant for cardiovascular disease and intervention, presurgical cardiac clearance was sought by the PAT team. Per cardiology, "this patient is optimized for surgery and may proceed with the planned procedural course with a ACCEPTABLE risk stratification".  Again, this patient is on daily DAPT therapy.  She has been instructed on recommendations from cardiology and vascular surgery for holding her clopidogrel for 5 days prior to her procedure with plans to restart as soon as postoperative bleeding risk felt to be minimal by attending surgeon.  Cardiology asking that patient continue her daily low-dose ASA throughout her perioperative course.  The patient is aware that her last dose of clopidogrel will be on 02/01/2021.  Patient endorses previous perioperative complications with anesthesia.  She has a PMH (+) for PONV.  Patient underwent a general anesthetic course at [facility] (ASA III) on 12/15/2018 with no documented complications.  Vitals with BMI 02/03/2021 02/03/2021 02/03/2021  Height - - -  Weight - - -  BMI - - -  Systolic 701 779 390  Diastolic 66 48 54  Pulse 56 61 -    Providers/Specialists:   NOTE: Primary physician provider listed below. Patient may have been seen by APP or partner within same practice.   PROVIDER ROLE / SPECIALTY LAST OV  Schnier, Dolores Lory, MD Vascular Surgery  01/12/2021  Idelle Crouch, MD Primary Care Provider  11/24/2020  Isaias Cowman, MD Cardiology  12/22/2020  Lavonia Dana, MD Nephrology  01/27/2021  Charlaine Dalton, MD Hematology  11/28/2020   Allergies:  Ozempic (0.25 or 0.5 mg-dose) [semaglutide(0.25 or 0.5mg -dos)] and Trulicity [dulaglutide]  Current Home Medications:   No current facility-administered medications for this encounter.   [name] allopurinol (ZYLOPRIM) 300 MG tablet  . amLODipine (NORVASC) 10 MG tablet  . aspirin EC 81 MG tablet  . calcitRIOL (ROCALTROL)  0.25 MCG capsule  . clopidogrel (PLAVIX) 75 MG tablet  . furosemide (LASIX) 20 MG tablet  . hydrALAZINE (APRESOLINE) 25 MG tablet  . insulin glargine (LANTUS SOLOSTAR) 100 UNIT/ML Solostar Pen  . insulin lispro (HUMALOG) 100 UNIT/ML KwikPen  . isosorbide mononitrate (IMDUR) 30 MG 24 hr tablet  . losartan (COZAAR) 100 MG tablet  . metoprolol tartrate (LOPRESSOR) 50 MG tablet  . rosuvastatin (CRESTOR) 20 MG tablet  . ONETOUCH VERIO test strip   History:   Past Medical History:  Diagnosis Date  . Anemia in chronic kidney disease 05/19/2016  . Anxiety   . Aortic atherosclerosis ([place])   . Blind   . CAD (coronary artery disease)   . Cardiac murmur   . CHF (congestive heart failure) ([place])    G2DD on 02/2020 TTE  . Chicken pox   . Chronic anticoagulation   . CVA (cerebral vascular accident) ([place])   . DDD (degenerative disc disease), lumbar   . Detached retina   . Diabetic neuropathy ([place])   . Diabetic retinopathy ([place])    legally blind  . Dyspnea   . ESRD needing dialysis ([place])   . Hx of CABG   . Hyperlipidemia   . Hypertension   . NSTEMI (non-ST elevated myocardial infarction) ([place]) 06/2012  . Obesity   . Pneumonia   . PONV (postoperative nausea and vomiting)   . Sciatica of right side   . Sleep apnea    CPAP NIGHTLY  . T2DM (type 2 diabetes mellitus) ([place])    Past Surgical History:  Procedure Laterality Date  . COLONOSCOPY WITH PROPOFOL N/A 01/26/2016   Procedure: COLONOSCOPY WITH PROPOFOL;  Surgeon: Lollie Sails, MD;  Location: [facility] ENDOSCOPY;  Service: Endoscopy;  Laterality: N/A;  . COLONOSCOPY WITH PROPOFOL N/A 01/27/2016   Procedure: COLONOSCOPY WITH PROPOFOL;  Surgeon: Lollie Sails, MD;  Location: [facility] ENDOSCOPY;  Service: Endoscopy;  Laterality: N/A;  . CORONARY ARTERY BYPASS GRAFT  2013  . DIALYSIS/PERMA CATHETER INSERTION N/A 02/03/2021   Procedure: DIALYSIS/PERMA CATHETER INSERTION;  Surgeon: Katha Cabal, MD;  Location: [place] CV LAB;   Service: Cardiovascular;  Laterality: N/A;  . INCISION AND DRAINAGE     chest abscess  . INCISION AND DRAINAGE ABSCESS N/A 12/11/2018   Procedure: INCISION AND DRAINAGE PERINEAL;  Surgeon: Jules Husbands, MD;  Location: [facility] ORS;  Service: General;  Laterality: N/A;  . RETINAL LASER PROCEDURE     Family History  Problem Relation Age of Onset  . Breast cancer Mother   . Prostate cancer Father        we think mets to liver and bone   Social History   Tobacco Use  . Smoking status: Never Smoker  . Smokeless tobacco: Never Used  Vaping Use  . Vaping Use: Never used  Substance Use Topics  . Alcohol use: No  . Drug use: No    Pertinent Clinical Results:  LABS: Labs reviewed: Acceptable for surgery.  Hospital Outpatient Visit on 02/04/2021  Component Date Value Ref Range Status  . WBC 02/04/2021 10.9* 4.0 - 10.5 K/uL Final  . RBC 02/04/2021 2.98* 3.87 - 5.11 MIL/uL Final  . Hemoglobin 02/04/2021 9.0* 12.0 - 15.0 g/dL Final  . HCT 02/04/2021 27.5* 36.0 - 46.0 % Final  . MCV 02/04/2021 92.3  80.0 - 100.0 fL Final  . MCH 02/04/2021 30.2  26.0 - 34.0 pg Final  . MCHC 02/04/2021 32.7  30.0 - 36.0 g/dL Final  . RDW 02/04/2021 13.7  11.5 - 15.5 % Final  . Platelets 02/04/2021 239  150 - 400 K/uL Final  . nRBC 02/04/2021 0.0  0.0 - 0.2 % Final  . Neutrophils Relative % 02/04/2021 69  % Final  . Neutro Abs 02/04/2021 7.5  1.7 - 7.7 K/uL Final  . Lymphocytes Relative 02/04/2021 17  % Final  . Lymphs Abs 02/04/2021 1.9  0.7 - 4.0 K/uL Final  . Monocytes Relative 02/04/2021 9  % Final  . Monocytes Absolute 02/04/2021 1.0  0.1 - 1.0 K/uL Final  . Eosinophils Relative 02/04/2021 4  % Final  . Eosinophils Absolute 02/04/2021 0.4  0.0 - 0.5 K/uL Final  . Basophils Relative 02/04/2021 1  % Final  . Basophils Absolute 02/04/2021 0.1  0.0 - 0.1 K/uL Final  . Immature Granulocytes 02/04/2021 0  % Final  . Abs Immature Granulocytes 02/04/2021 0.04  0.00 - 0.07 K/uL Final   Performed at  [facility], [address], [place], [place] [address]  . Sodium 02/04/2021 140  135 - 145 mmol/L Final  . Potassium 02/04/2021 4.3  3.5 - 5.1 mmol/L Final  . Chloride 02/04/2021 110  98 - 111 mmol/L Final  . CO2 02/04/2021 18* 22 - 32 mmol/L Final  . Glucose, Bld 02/04/2021 192* 70 - 99 mg/dL Final   Glucose reference range applies only to samples taken after fasting for at least 8 hours.  . BUN 02/04/2021 65* 6 - 20 mg/dL Final  . Creatinine, Ser 02/04/2021 3.64* 0.44 - 1.00 mg/dL Final  . Calcium 02/04/2021 9.3  8.9 - 10.3 mg/dL Final  . GFR, Estimated 02/04/2021 14* >60 mL/min Final  Comment: (NOTE) Calculated using the CKD-EPI Creatinine Equation (2021)   . Anion gap 02/04/2021 12  5 - 15 Final   Performed at [facility], [address] [place], [place] [address]  . Prothrombin Time 02/04/2021 13.8  11.4 - 15.2 seconds Final  . INR 02/04/2021 1.1  0.8 - 1.2 Final   Comment: (NOTE) INR goal varies based on device and disease states. Performed at [facility], [address], [place], [place] [address]   . aPTT 02/04/2021 30  24 - 36 seconds Final   Performed at [facility], [address] [place], [place] [address]  Admission on 02/03/2021, Discharged on 02/03/2021  Component Date Value Ref Range Status  . Hep B C IgM 02/03/2021 NON REACTIVE  NON REACTIVE Final   Performed at [facility], [place] [address] [place], [place] [address]  . Hepatitis B-Post 02/03/2021 118.5  Immunity>9.9 mIU/mL Final   Comment: (NOTE)  Status of Immunity                     Anti-HBs Level  ------------------                     -------------- Inconsistent with Immunity                   0.0 - 9.9 Consistent with Immunity                          >9.9 Performed At: [facility] [address] [place], [place] [id number] Rush Farmer MD RK:[id number]   . Potassium [facility] vascular lab) 02/03/2021 5.1  3.5 - 5.1 Final   Performed at  [facility], [address] [place], [place] [address]  . Glucose-Capillary 02/03/2021 200* 70 - 99 mg/dL Final   Glucose reference range applies only to samples taken after fasting for at least 8 hours.  . Glucose-Capillary 02/03/2021 226* 70 - 99 mg/dL Final   Glucose reference range applies only to samples taken after fasting for at least 8 hours.    ECG: Date: 02/04/2021 Time ECG obtained: 1153 AM Rate: 54 bpm Rhythm: Sinus rhythm with first-degree AV block Axis (leads I and aVF): Normal Intervals: QRS 98 ms. QTc 470 ms. ST segment and T wave changes: Nonspecific lateral ST segment changes   comparison: Similar to previous tracing obtained on 05/20/2020    IMAGING / PROCEDURES: ECHOCARDIOGRAM performed on 03/18/2020 1. Left ventricular ejection fraction, by estimation, is 55 to 60%.  2. The left ventricle has normal function.  3. The left ventricle has no regional wall motion abnormalities.  4. Left ventricular diastolic parameters are consistent with Grade II diastolic dysfunction (pseudonormalization).  5. Elevated left atrial pressure.  6. Right ventricular systolic function is mildly reduced.  7. The right ventricular size is normal.  8. Left atrial size was mildly dilated. There is irregular thickening of the interatrial septum, incompletely characterized on this study.  9. The mitral valve is degenerative. Trivial mitral valve regurgitation. No evidence of mitral stenosis.  10. The aortic valve was not well visualized. Aortic valve regurgitation is not visualized. No aortic stenosis is present.  11. The inferior vena cava is normal in size with greater than 50% respiratory variability, suggesting right atrial pressure of 3 mmHg.   BILATERAL CAROTID DOPPLER STUDY performed on 07/21/2019 1. Right:  Mild plaquing in the carotid bulb extending to the ICA origin  No high-grade stenosis  Normal waveforms and color Doppler signal 2. Left:  Calcified plaque at  the carotid bifurcation resulting in a <50% diameter ICA stenosis  No high-grade stenosis  Normal waveforms and color Doppler signal 3. Vertebrals:  Normal antegrade vertebral arterial flow bilaterally  LEFT HEART CATHETERIZATION AND CORONARY ANGIOGRAPHY performed on 01/20/2017 1. Three-vessel CAD  Distal left main 50% stenosis  Mid LAD 70% stenosis  Ostial left circumflex 60% stenosis  Mid RCA 80% stenosis  LIMA to distal LAD no disease  SVG to OM1 100% ostial with TIMI flow 0  SVG to OM2 100% ostial 2. Patent LIMA graft to the LAD 3. No grafts were visualized to either the OM1 or OM2 despite multiple attempts with multiple different catheters.  Also no grafts were visualized on thoracic aortography. 4. Suspect mid RCA lesion is the culprit; successfully revascularized with a 2.5 x 20 mm DES  80% preinterventional stenosis to 10% stenosis post interventional 5. LVEDP: 7 mmHg  LEXISCAN performed on 01/19/2017 1. LVEF 54% 2. Normal  left ventricular function, myocardial thickening, and regional wall motion 3. Reversible appearing changes in the inferior and inferolateral segments on stress rest imaging may represent peri-infarct ischemia, however valve activity variability between stress and rest can create such artifact. 4. Reversible findings in the basal inferoseptum are also demonstrated with adjacent bowel activity, which may create an artifact, ischemia can have similar appearance  Impression and Plan:  MEGYN LENG has been referred for pre-anesthesia review and clearance prior to her undergoing the planned anesthetic and procedural courses. Available labs, pertinent testing, and imaging results were personally reviewed by me. This patient has been appropriately cleared by cardiology with an overall ACCEPTABLE risk of significant perioperative cardiovascular complications.  Based on clinical review performed today (02/04/21), barring any significant acute changes in  the patient's overall condition, it is anticipated that she will be able to proceed with the planned surgical intervention. Any acute changes in clinical condition may necessitate her procedure being postponed and/or cancelled. Pre-surgical instructions were reviewed with the patient during her PAT appointment and questions were fielded by PAT clinical staff.  Honor Loh, MSN, APRN, FNP-C, CEN [facility]  Peri-operative Services Nurse Practitioner Phone: [phone] 02/04/21 1:31 PM  NOTE: This note has been prepared using Dragon dictation software. Despite my best ability to proofread, there is always the potential that unintentional transcriptional errors may still occur from this process.

## 2021-01-30 ENCOUNTER — Other Ambulatory Visit: Payer: Self-pay

## 2021-01-30 ENCOUNTER — Other Ambulatory Visit
Admission: RE | Admit: 2021-01-30 | Discharge: 2021-01-30 | Disposition: A | Discharge status: Home / Self Care | Payer: Medicare Other | Source: Ambulatory Visit | Attending: Vascular Surgery | Admitting: Vascular Surgery

## 2021-01-30 DIAGNOSIS — Z01812 Encounter for preprocedural laboratory examination: Secondary | ICD-10-CM | POA: Insufficient documentation

## 2021-01-30 DIAGNOSIS — Z20822 Contact with and (suspected) exposure to covid-19: Secondary | ICD-10-CM | POA: Insufficient documentation

## 2021-01-31 LAB — SARS CORONAVIRUS 2 (TAT 6-24 HRS): SARS Coronavirus 2: NEGATIVE

## 2021-02-02 ENCOUNTER — Other Ambulatory Visit: Payer: Medicare Other

## 2021-02-02 ENCOUNTER — Other Ambulatory Visit: Payer: Self-pay | Admitting: Nephrology

## 2021-02-02 DIAGNOSIS — Z111 Encounter for screening for respiratory tuberculosis: Secondary | ICD-10-CM

## 2021-02-03 ENCOUNTER — Ambulatory Visit
Admission: RE | Admit: 2021-02-03 | Discharge: 2021-02-03 | Disposition: A | Discharge status: Home / Self Care | Payer: Medicare Other | Attending: Vascular Surgery | Admitting: Vascular Surgery

## 2021-02-03 ENCOUNTER — Other Ambulatory Visit (INDEPENDENT_AMBULATORY_CARE_PROVIDER_SITE_OTHER): Payer: Self-pay | Admitting: Nurse Practitioner

## 2021-02-03 ENCOUNTER — Other Ambulatory Visit: Payer: Self-pay

## 2021-02-03 ENCOUNTER — Encounter
Admission: RE | Disposition: A | Discharge status: Home / Self Care | Payer: Self-pay | Source: Home / Self Care | Attending: Vascular Surgery

## 2021-02-03 DIAGNOSIS — D631 Anemia in chronic kidney disease: Secondary | ICD-10-CM | POA: Insufficient documentation

## 2021-02-03 DIAGNOSIS — E1122 Type 2 diabetes mellitus with diabetic chronic kidney disease: Secondary | ICD-10-CM | POA: Diagnosis not present

## 2021-02-03 DIAGNOSIS — N186 End stage renal disease: Secondary | ICD-10-CM

## 2021-02-03 DIAGNOSIS — N185 Chronic kidney disease, stage 5: Secondary | ICD-10-CM | POA: Diagnosis not present

## 2021-02-03 DIAGNOSIS — Z794 Long term (current) use of insulin: Secondary | ICD-10-CM | POA: Insufficient documentation

## 2021-02-03 DIAGNOSIS — Z79899 Other long term (current) drug therapy: Secondary | ICD-10-CM | POA: Insufficient documentation

## 2021-02-03 DIAGNOSIS — R079 Chest pain, unspecified: Secondary | ICD-10-CM

## 2021-02-03 DIAGNOSIS — I120 Hypertensive chronic kidney disease with stage 5 chronic kidney disease or end stage renal disease: Secondary | ICD-10-CM | POA: Insufficient documentation

## 2021-02-03 DIAGNOSIS — E785 Hyperlipidemia, unspecified: Secondary | ICD-10-CM | POA: Insufficient documentation

## 2021-02-03 DIAGNOSIS — Z992 Dependence on renal dialysis: Secondary | ICD-10-CM | POA: Diagnosis not present

## 2021-02-03 DIAGNOSIS — Z7982 Long term (current) use of aspirin: Secondary | ICD-10-CM | POA: Insufficient documentation

## 2021-02-03 HISTORY — PX: DIALYSIS/PERMA CATHETER INSERTION: CATH118288

## 2021-02-03 LAB — POTASSIUM (ARMC VASCULAR LAB ONLY): Potassium (ARMC vascular lab): 5.1 (ref 3.5–5.1)

## 2021-02-03 LAB — HEPATITIS B CORE ANTIBODY, IGM: Hep B C IgM: NONREACTIVE

## 2021-02-03 LAB — GLUCOSE, CAPILLARY
Glucose-Capillary: 200 mg/dL — ABNORMAL HIGH (ref 70–99)
Glucose-Capillary: 226 mg/dL — ABNORMAL HIGH (ref 70–99)

## 2021-02-03 SURGERY — DIALYSIS/PERMA CATHETER INSERTION
Anesthesia: Moderate Sedation

## 2021-02-03 MED ORDER — MIDAZOLAM HCL 2 MG/ML PO SYRP: 8.0000 mg | ORAL_SOLUTION | Freq: Once | ORAL | Status: DC | PRN

## 2021-02-03 MED ORDER — CEFAZOLIN SODIUM-DEXTROSE 1-4 GM/50ML-% IV SOLN: 1.0000 g | Freq: Once | INTRAVENOUS | Status: AC

## 2021-02-03 MED ORDER — MIDAZOLAM HCL 5 MG/5ML IJ SOLN
INTRAMUSCULAR | Status: AC
  Filled 2021-02-03: qty 5

## 2021-02-03 MED ORDER — DIPHENHYDRAMINE HCL 50 MG/ML IJ SOLN: 50.0000 mg | Freq: Once | INTRAMUSCULAR | Status: DC | PRN

## 2021-02-03 MED ORDER — FENTANYL CITRATE (PF) 100 MCG/2ML IJ SOLN
INTRAMUSCULAR | Status: AC
  Filled 2021-02-03: qty 2

## 2021-02-03 MED ORDER — FAMOTIDINE 20 MG PO TABS: 40.0000 mg | ORAL_TABLET | Freq: Once | ORAL | Status: DC | PRN

## 2021-02-03 MED ORDER — METHYLPREDNISOLONE SODIUM SUCC 125 MG IJ SOLR: 125.0000 mg | Freq: Once | INTRAMUSCULAR | Status: DC | PRN

## 2021-02-03 MED ORDER — FENTANYL CITRATE (PF) 100 MCG/2ML IJ SOLN
INTRAMUSCULAR | Status: DC | PRN
  Administered 2021-02-03: 25 ug via INTRAVENOUS

## 2021-02-03 MED ORDER — SODIUM CHLORIDE 0.9 % IV SOLN: INTRAVENOUS | Status: DC

## 2021-02-03 MED ORDER — MIDAZOLAM HCL 2 MG/2ML IJ SOLN
INTRAMUSCULAR | Status: DC | PRN
  Administered 2021-02-03: 1 mg via INTRAVENOUS

## 2021-02-03 MED ORDER — CEFAZOLIN SODIUM-DEXTROSE 1-4 GM/50ML-% IV SOLN
INTRAVENOUS | Status: AC
  Administered 2021-02-03: 1 g via INTRAVENOUS
  Filled 2021-02-03: qty 50

## 2021-02-03 SURGICAL SUPPLY — 10 items
ADH SKN CLS APL DERMABOND .7 (GAUZE/BANDAGES/DRESSINGS) ×1
BIOPATCH RED 1 DISK 7.0 (GAUZE/BANDAGES/DRESSINGS) ×2 IMPLANT
CATH PALIN MAXID VT KIT 19CM (CATHETERS) ×2 IMPLANT
DERMABOND ADVANCED (GAUZE/BANDAGES/DRESSINGS) ×1
DERMABOND ADVANCED .7 DNX12 (GAUZE/BANDAGES/DRESSINGS) ×1 IMPLANT
NEEDLE ENTRY 21GA 7CM ECHOTIP (NEEDLE) ×2 IMPLANT
PACK ANGIOGRAPHY (CUSTOM PROCEDURE TRAY) ×2 IMPLANT
SET INTRO CAPELLA COAXIAL (SET/KITS/TRAYS/PACK) ×2 IMPLANT
SUT MNCRL AB 4-0 PS2 18 (SUTURE) ×2 IMPLANT
SUT SILK 0 SH 30 (SUTURE) ×2 IMPLANT

## 2021-02-03 NOTE — Interval H&P Note (Signed)
History and Physical Interval Note:  02/03/2021 12:30 PM  Sharon Arias  has presented today for surgery, with the diagnosis of Perma Cath Insertion   End Stage Renal Covid Feb 11.  The various methods of treatment have been discussed with the patient and family. After consideration of risks, benefits and other options for treatment, the patient has consented to  Procedure(s): DIALYSIS/PERMA CATHETER INSERTION (N/A) as a surgical intervention.  The patient's history has been reviewed, patient examined, no change in status, stable for surgery.  I have reviewed the patient's chart and labs.  Questions were answered to the patient's satisfaction.     [name]

## 2021-02-03 NOTE — Op Note (Signed)
[facility]   OPERATIVE NOTE     PROCEDURE: 1. Insertion of a right IJ tunneled dialysis catheter. 2. Catheter placement and cannulation under ultrasound and fluoroscopic guidance  PRE-OPERATIVE DIAGNOSIS: end-stage renal requiring hemodialysis  POST-OPERATIVE DIAGNOSIS: same as above  SURGEON: [name]  ANESTHESIA: Conscious sedation was administered under my direct supervision by the interventional radiology RN. IV Versed plus fentanyl were utilized. Continuous ECG, pulse oximetry and blood pressure was monitored throughout the entire procedure.  Conscious sedation was for a total of 25 minutes.  ESTIMATED BLOOD LOSS: Minimal  FINDING(S): 1.  Tips of the catheter in the right atrium on fluoroscopy 2.  No obvious pneumothorax on fluoroscopy  SPECIMEN(S):  none  INDICATIONS:   Sharon Arias is a 60 y.o. female  presents with end stage renal disease.  Therefore, the patient requires a tunneled dialysis catheter placement.  The patient is informed of  the risks catheter placement include but are not limited to: bleeding, infection, central venous injury, pneumothorax, possible venous stenosis, possible malpositioning in the venous system, and possible infections related to long-term catheter presence.  The patient was aware of these risks and agreed to proceed.  DESCRIPTION: The patient was taken back to Special Procedure suite.  Prior to sedation, the patient was given IV antibiotics.  After obtaining adequate sedation, the patient was prepped and draped in the standard fashion for a right IJ tunneled dialysis catheter placement.  Appropriate Time Out is called.     The right neck and chest wall are then infiltrated with 1% Lidocaine with epinepherine.  A 19 cm tip to cuff [place] catheter is then selected, opened on the back table and prepped. Ultrasound is placed in a sterile sleeve.  Under ultrasound guidance, the right internal jugular vein is  examined and is noted to be echolucent and easily compressible indicating patency.   An image is recorded for the permanent record.  The right internal jugular vein is cannulated with the microneedle under direct ultrasound vissualization.  A Microwire followed by a micro sheath is then inserted without difficulty.   A J-wire was then advanced under fluoroscopic guidance into the inferior vena cava and the wire was secured.  Small counter incision was then made at the wire insertion site. A small pocket was fashioned with blunt dissection to allow easier passage of the cuff.  The dilator and peel-away sheath are then advanced over the wire under fluoroscopic guidance. The catheters and advanced through the peel-away sheath. It is approximated to the right chest wall after verifying the tips at the atrial caval junction and an exit site is selected.  Small incision is made at the selected exit site and the tunneling device was passed subcutaneously to the counter incision. Catheter is then pulled through the subcutaneous tunnel. The catheter is then verified for tip position under fluoroscopy, transected and the hub assembly connected.    Each port was tested by aspirating and flushing.  No resistance was noted.  Each port was then thoroughly flushed with heparinized saline.  The catheter was secured in placed with two interrupted stitches of 0 silk tied to the catheter.  The counter incision was closed with a U-stitch of 4-0 Monocryl.  The insertion site is then cleaned and sterile bandages applied including a Biopatch.  Each port was then packed with concentrated heparin (1000 Units/mL) at the manufacturer recommended volumes to each port.  Sterile caps were applied to each port.  On completion fluoroscopy, the  tips of the catheter were in the right atrium, and there was no evidence of pneumothorax.  COMPLICATIONS: None  CONDITION: Unchanged   [name] [place] [facility] Office:  [phone]   02/03/2021, 1:33 PM

## 2021-02-04 ENCOUNTER — Other Ambulatory Visit
Admission: RE | Admit: 2021-02-04 | Discharge: 2021-02-04 | Disposition: A | Discharge status: Home / Self Care | Payer: Medicare Other | Source: Ambulatory Visit | Attending: Vascular Surgery | Admitting: Vascular Surgery

## 2021-02-04 ENCOUNTER — Encounter: Payer: Self-pay | Admitting: Vascular Surgery

## 2021-02-04 DIAGNOSIS — Z01818 Encounter for other preprocedural examination: Secondary | ICD-10-CM | POA: Insufficient documentation

## 2021-02-04 DIAGNOSIS — Z01810 Encounter for preprocedural cardiovascular examination: Secondary | ICD-10-CM | POA: Diagnosis not present

## 2021-02-04 DIAGNOSIS — Z20822 Contact with and (suspected) exposure to covid-19: Secondary | ICD-10-CM | POA: Diagnosis not present

## 2021-02-04 LAB — BASIC METABOLIC PANEL
Anion gap: 12 (ref 5–15)
BUN: 65 mg/dL — ABNORMAL HIGH (ref 6–20)
CO2: 18 mmol/L — ABNORMAL LOW (ref 22–32)
Calcium: 9.3 mg/dL (ref 8.9–10.3)
Chloride: 110 mmol/L (ref 98–111)
Creatinine, Ser: 3.64 mg/dL — ABNORMAL HIGH (ref 0.44–1.00)
GFR, Estimated: 14 mL/min — ABNORMAL LOW (ref >60)
Glucose, Bld: 192 mg/dL — ABNORMAL HIGH (ref 70–99)
Potassium: 4.3 mmol/L (ref 3.5–5.1)
Sodium: 140 mmol/L (ref 135–145)

## 2021-02-04 LAB — CBC WITH DIFFERENTIAL/PLATELET
Abs Immature Granulocytes: 0.04 10*3/uL (ref 0.00–0.07)
Basophils Absolute: 0.1 10*3/uL (ref 0.0–0.1)
Basophils Relative: 1 %
Eosinophils Absolute: 0.4 10*3/uL (ref 0.0–0.5)
Eosinophils Relative: 4 %
HCT: 27.5 % — ABNORMAL LOW (ref 36.0–46.0)
Hemoglobin: 9 g/dL — ABNORMAL LOW (ref 12.0–15.0)
Immature Granulocytes: 0 %
Lymphocytes Relative: 17 %
Lymphs Abs: 1.9 10*3/uL (ref 0.7–4.0)
MCH: 30.2 pg (ref 26.0–34.0)
MCHC: 32.7 g/dL (ref 30.0–36.0)
MCV: 92.3 fL (ref 80.0–100.0)
Monocytes Absolute: 1 10*3/uL (ref 0.1–1.0)
Monocytes Relative: 9 %
Neutro Abs: 7.5 10*3/uL (ref 1.7–7.7)
Neutrophils Relative %: 69 %
Platelets: 239 10*3/uL (ref 150–400)
RBC: 2.98 MIL/uL — ABNORMAL LOW (ref 3.87–5.11)
RDW: 13.7 % (ref 11.5–15.5)
WBC: 10.9 10*3/uL — ABNORMAL HIGH (ref 4.0–10.5)
nRBC: 0 % (ref 0.0–0.2)

## 2021-02-04 LAB — TYPE AND SCREEN
ABO/RH(D): A POS
Antibody Screen: NEGATIVE

## 2021-02-04 LAB — PROTIME-INR
INR: 1.1 (ref 0.8–1.2)
Prothrombin Time: 13.8 seconds (ref 11.4–15.2)

## 2021-02-04 LAB — HEPATITIS B SURFACE ANTIGEN: Hepatitis B Surface Ag: NONREACTIVE

## 2021-02-04 LAB — HEPATITIS B SURFACE ANTIBODY, QUANTITATIVE: Hep B S AB Quant (Post): 118.5 m[IU]/mL (ref >9.9)

## 2021-02-04 LAB — APTT: aPTT: 30 seconds (ref 24–36)

## 2021-02-05 ENCOUNTER — Ambulatory Visit
Admission: RE | Admit: 2021-02-05 | Discharge: 2021-02-05 | Disposition: A | Discharge status: Home / Self Care | Payer: Medicare Other | Source: Ambulatory Visit | Attending: Nephrology | Admitting: Nephrology

## 2021-02-05 ENCOUNTER — Other Ambulatory Visit: Payer: Self-pay

## 2021-02-05 DIAGNOSIS — Z111 Encounter for screening for respiratory tuberculosis: Secondary | ICD-10-CM | POA: Insufficient documentation

## 2021-02-05 LAB — SARS CORONAVIRUS 2 (TAT 6-24 HRS): SARS Coronavirus 2: NEGATIVE

## 2021-02-05 MED ORDER — CEFAZOLIN SODIUM-DEXTROSE 1-4 GM/50ML-% IV SOLN
1.0000 g | INTRAVENOUS | Status: AC
  Administered 2021-02-06: 1 g via INTRAVENOUS

## 2021-02-05 MED ORDER — CHLORHEXIDINE GLUCONATE CLOTH 2 % EX PADS: 6.0000 | MEDICATED_PAD | Freq: Once | CUTANEOUS | Status: DC

## 2021-02-05 MED ORDER — ORAL CARE MOUTH RINSE: 15.0000 mL | Freq: Once | OROMUCOSAL | Status: AC

## 2021-02-05 MED ORDER — FAMOTIDINE 20 MG PO TABS
20.0000 mg | ORAL_TABLET | Freq: Once | ORAL | Status: AC
  Administered 2021-02-06: 20 mg via ORAL

## 2021-02-05 MED ORDER — CHLORHEXIDINE GLUCONATE 0.12 % MT SOLN
15.0000 mL | Freq: Once | OROMUCOSAL | Status: AC
  Administered 2021-02-06: 15 mL via OROMUCOSAL

## 2021-02-05 MED ORDER — SODIUM CHLORIDE 0.9 % IV SOLN: INTRAVENOUS | Status: DC

## 2021-02-05 MED ORDER — HYDROMORPHONE HCL 1 MG/ML IJ SOLN
1.0000 mg | Freq: Once | INTRAMUSCULAR | Status: DC | PRN
Start: 2021-02-05 — End: 2021-02-06

## 2021-02-06 ENCOUNTER — Ambulatory Visit: Payer: Medicare Other

## 2021-02-06 ENCOUNTER — Ambulatory Visit: Payer: Medicare Other | Admitting: Urgent Care

## 2021-02-06 ENCOUNTER — Encounter: Payer: Self-pay | Admitting: Vascular Surgery

## 2021-02-06 ENCOUNTER — Encounter
Admission: RE | Disposition: A | Discharge status: Home / Self Care | Payer: Self-pay | Source: Home / Self Care | Attending: Vascular Surgery

## 2021-02-06 ENCOUNTER — Ambulatory Visit
Admission: RE | Admit: 2021-02-06 | Discharge: 2021-02-06 | Disposition: A | Discharge status: Home / Self Care | Payer: Medicare Other | Attending: Vascular Surgery | Admitting: Vascular Surgery

## 2021-02-06 ENCOUNTER — Other Ambulatory Visit: Payer: Self-pay

## 2021-02-06 DIAGNOSIS — E785 Hyperlipidemia, unspecified: Secondary | ICD-10-CM | POA: Insufficient documentation

## 2021-02-06 DIAGNOSIS — I120 Hypertensive chronic kidney disease with stage 5 chronic kidney disease or end stage renal disease: Secondary | ICD-10-CM | POA: Insufficient documentation

## 2021-02-06 DIAGNOSIS — N186 End stage renal disease: Secondary | ICD-10-CM | POA: Insufficient documentation

## 2021-02-06 DIAGNOSIS — Z79899 Other long term (current) drug therapy: Secondary | ICD-10-CM | POA: Insufficient documentation

## 2021-02-06 DIAGNOSIS — Z7982 Long term (current) use of aspirin: Secondary | ICD-10-CM | POA: Diagnosis not present

## 2021-02-06 DIAGNOSIS — Z992 Dependence on renal dialysis: Secondary | ICD-10-CM | POA: Diagnosis not present

## 2021-02-06 DIAGNOSIS — E1122 Type 2 diabetes mellitus with diabetic chronic kidney disease: Secondary | ICD-10-CM | POA: Insufficient documentation

## 2021-02-06 DIAGNOSIS — Z7902 Long term (current) use of antithrombotics/antiplatelets: Secondary | ICD-10-CM | POA: Diagnosis not present

## 2021-02-06 DIAGNOSIS — N185 Chronic kidney disease, stage 5: Secondary | ICD-10-CM

## 2021-02-06 DIAGNOSIS — D631 Anemia in chronic kidney disease: Secondary | ICD-10-CM | POA: Insufficient documentation

## 2021-02-06 DIAGNOSIS — Z794 Long term (current) use of insulin: Secondary | ICD-10-CM | POA: Insufficient documentation

## 2021-02-06 DIAGNOSIS — Z419 Encounter for procedure for purposes other than remedying health state, unspecified: Secondary | ICD-10-CM

## 2021-02-06 HISTORY — DX: Atherosclerosis of aorta: I70.0

## 2021-02-06 HISTORY — DX: Presence of aortocoronary bypass graft: Z95.1

## 2021-02-06 HISTORY — DX: Cardiac murmur, unspecified: R01.1

## 2021-02-06 HISTORY — DX: Other intervertebral disc degeneration, lumbar region: M51.36

## 2021-02-06 HISTORY — DX: End stage renal disease: N18.6

## 2021-02-06 HISTORY — DX: Other intervertebral disc degeneration, lumbar region without mention of lumbar back pain or lower extremity pain: M51.369

## 2021-02-06 HISTORY — PX: AV FISTULA PLACEMENT: SHX1204

## 2021-02-06 HISTORY — DX: Type 2 diabetes mellitus without complications: E11.9

## 2021-02-06 HISTORY — DX: Long term (current) use of anticoagulants: Z79.01

## 2021-02-06 LAB — POCT I-STAT, CHEM 8
BUN: 75 mg/dL — ABNORMAL HIGH (ref 6–20)
Calcium, Ion: 1.27 mmol/L (ref 1.15–1.40)
Chloride: 114 mmol/L — ABNORMAL HIGH (ref 98–111)
Creatinine, Ser: 4.4 mg/dL — ABNORMAL HIGH (ref 0.44–1.00)
Glucose, Bld: 207 mg/dL — ABNORMAL HIGH (ref 70–99)
HCT: 27 % — ABNORMAL LOW (ref 36.0–46.0)
Hemoglobin: 9.2 g/dL — ABNORMAL LOW (ref 12.0–15.0)
Potassium: 4.1 mmol/L (ref 3.5–5.1)
Sodium: 143 mmol/L (ref 135–145)
TCO2: 17 mmol/L — ABNORMAL LOW (ref 22–32)

## 2021-02-06 LAB — GLUCOSE, CAPILLARY
Glucose-Capillary: 192 mg/dL — ABNORMAL HIGH (ref 70–99)
Glucose-Capillary: 223 mg/dL — ABNORMAL HIGH (ref 70–99)

## 2021-02-06 SURGERY — ARTERIOVENOUS (AV) FISTULA CREATION
Anesthesia: Regional | Site: Arm Upper | Laterality: Left

## 2021-02-06 MED ORDER — ROPIVACAINE HCL 5 MG/ML IJ SOLN
INTRAMUSCULAR | Status: AC
  Filled 2021-02-06: qty 30

## 2021-02-06 MED ORDER — CEFAZOLIN SODIUM-DEXTROSE 1-4 GM/50ML-% IV SOLN
INTRAVENOUS | Status: AC
  Filled 2021-02-06: qty 50

## 2021-02-06 MED ORDER — PROPOFOL 500 MG/50ML IV EMUL
INTRAVENOUS | Status: DC | PRN
  Administered 2021-02-06: 75 ug/kg/min via INTRAVENOUS

## 2021-02-06 MED ORDER — PROPOFOL 500 MG/50ML IV EMUL
INTRAVENOUS | Status: AC
  Filled 2021-02-06: qty 50

## 2021-02-06 MED ORDER — PROPOFOL 10 MG/ML IV BOLUS
INTRAVENOUS | Status: AC
  Filled 2021-02-06: qty 20

## 2021-02-06 MED ORDER — EPHEDRINE SULFATE 50 MG/ML IJ SOLN
INTRAMUSCULAR | Status: DC | PRN
  Administered 2021-02-06: 10 mg via INTRAVENOUS

## 2021-02-06 MED ORDER — PROPOFOL 10 MG/ML IV BOLUS
INTRAVENOUS | Status: DC | PRN
  Administered 2021-02-06: 30 mg via INTRAVENOUS

## 2021-02-06 MED ORDER — BUPIVACAINE LIPOSOME 1.3 % IJ SUSP
INTRAMUSCULAR | Status: DC | PRN
  Administered 2021-02-06: 10 mL

## 2021-02-06 MED ORDER — PAPAVERINE HCL 30 MG/ML IJ SOLN
INTRAMUSCULAR | Status: AC
  Filled 2021-02-06: qty 2

## 2021-02-06 MED ORDER — PHENYLEPHRINE HCL (PRESSORS) 10 MG/ML IV SOLN
INTRAVENOUS | Status: DC | PRN
  Administered 2021-02-06: 100 ug via INTRAVENOUS

## 2021-02-06 MED ORDER — MIDAZOLAM HCL 2 MG/2ML IJ SOLN
INTRAMUSCULAR | Status: AC
  Administered 2021-02-06: 1 mg
  Filled 2021-02-06: qty 2

## 2021-02-06 MED ORDER — HEPARIN SODIUM (PORCINE) 5000 UNIT/ML IJ SOLN
INTRAMUSCULAR | Status: AC
  Filled 2021-02-06: qty 1

## 2021-02-06 MED ORDER — BUPIVACAINE HCL (PF) 0.5 % IJ SOLN
INTRAMUSCULAR | Status: DC | PRN
  Administered 2021-02-06: 15 mL

## 2021-02-06 MED ORDER — BUPIVACAINE LIPOSOME 1.3 % IJ SUSP
INTRAMUSCULAR | Status: AC
  Filled 2021-02-06: qty 20

## 2021-02-06 MED ORDER — CHLORHEXIDINE GLUCONATE 0.12 % MT SOLN
OROMUCOSAL | Status: AC
  Filled 2021-02-06: qty 15

## 2021-02-06 MED ORDER — SODIUM CHLORIDE 0.9 % IV SOLN
INTRAVENOUS | Status: DC | PRN
  Administered 2021-02-06: 501 mL via INTRAMUSCULAR

## 2021-02-06 MED ORDER — FAMOTIDINE 20 MG PO TABS
ORAL_TABLET | ORAL | Status: AC
  Filled 2021-02-06: qty 1

## 2021-02-06 MED ORDER — HYDROCODONE-ACETAMINOPHEN 5-325 MG PO TABS
1.0000 | ORAL_TABLET | Freq: Four times a day (QID) | ORAL | 0 refills | Status: DC | PRN
Start: 2021-02-06 — End: 2021-04-17

## 2021-02-06 MED ORDER — BUPIVACAINE HCL (PF) 0.5 % IJ SOLN
INTRAMUSCULAR | Status: AC
  Filled 2021-02-06: qty 30

## 2021-02-06 MED ORDER — FENTANYL CITRATE (PF) 100 MCG/2ML IJ SOLN
INTRAMUSCULAR | Status: AC
  Administered 2021-02-06: 50 ug
  Filled 2021-02-06: qty 2

## 2021-02-06 SURGICAL SUPPLY — 55 items
APPLIER CLIP 11 MED OPEN (CLIP)
APPLIER CLIP 9.375 SM OPEN (CLIP)
BAG DECANTER FOR FLEXI CONT (MISCELLANEOUS) ×2 IMPLANT
BLADE SURG SZ11 CARB STEEL (BLADE) ×2 IMPLANT
BOOT SUTURE AID YELLOW STND (SUTURE) ×2 IMPLANT
BRUSH SCRUB EZ  4% CHG (MISCELLANEOUS) ×1
BRUSH SCRUB EZ 4% CHG (MISCELLANEOUS) ×1 IMPLANT
CANISTER SUCT 1200ML W/VALVE (MISCELLANEOUS) ×2 IMPLANT
CHLORAPREP W/TINT 26 (MISCELLANEOUS) ×2 IMPLANT
CLIP APPLIE 11 MED OPEN (CLIP) IMPLANT
CLIP APPLIE 9.375 SM OPEN (CLIP) IMPLANT
COVER WAND RF STERILE (DRAPES) ×2 IMPLANT
DERMABOND ADVANCED (GAUZE/BANDAGES/DRESSINGS) ×1
DERMABOND ADVANCED .7 DNX12 (GAUZE/BANDAGES/DRESSINGS) ×1 IMPLANT
DRESSING SURGICEL FIBRLLR 1X2 (HEMOSTASIS) ×1 IMPLANT
DRSG SURGICEL FIBRILLAR 1X2 (HEMOSTASIS) ×2
ELECT CAUTERY BLADE 6.4 (BLADE) ×2 IMPLANT
ELECT REM PT RETURN 9FT ADLT (ELECTROSURGICAL) ×2
ELECTRODE REM PT RTRN 9FT ADLT (ELECTROSURGICAL) ×1 IMPLANT
GEL ULTRASOUND 20GR AQUASONIC (MISCELLANEOUS) IMPLANT
GLOVE SURG ENC MOIS LTX SZ7 (GLOVE) ×2 IMPLANT
GLOVE SURG SYN 8.0 (GLOVE) ×2 IMPLANT
GLOVE SURG UNDER LTX SZ7.5 (GLOVE) ×2 IMPLANT
GOWN STRL REUS W/ TWL LRG LVL3 (GOWN DISPOSABLE) ×2 IMPLANT
GOWN STRL REUS W/ TWL XL LVL3 (GOWN DISPOSABLE) ×1 IMPLANT
GOWN STRL REUS W/TWL LRG LVL3 (GOWN DISPOSABLE) ×2
GOWN STRL REUS W/TWL XL LVL3 (GOWN DISPOSABLE) ×1
IV NS 500ML (IV SOLUTION) ×1
IV NS 500ML BAXH (IV SOLUTION) ×1 IMPLANT
KIT TURNOVER KIT A (KITS) ×2 IMPLANT
LABEL OR SOLS (LABEL) ×2 IMPLANT
LOOP RED MAXI  1X406MM (MISCELLANEOUS) ×1
LOOP VESSEL MAXI 1X406 RED (MISCELLANEOUS) ×1 IMPLANT
LOOP VESSEL MINI 0.8X406 BLUE (MISCELLANEOUS) ×2 IMPLANT
LOOPS BLUE MINI 0.8X406MM (MISCELLANEOUS) ×2
MANIFOLD NEPTUNE II (INSTRUMENTS) ×2 IMPLANT
NEEDLE FILTER BLUNT 18X 1/2SAF (NEEDLE) ×1
NEEDLE FILTER BLUNT 18X1 1/2 (NEEDLE) ×1 IMPLANT
NEEDLE HYPO 30X.5 LL (NEEDLE) IMPLANT
PACK EXTREMITY ARMC (MISCELLANEOUS) ×2 IMPLANT
PAD PREP 24X41 OB/GYN DISP (PERSONAL CARE ITEMS) ×2 IMPLANT
STOCKINETTE STRL 4IN 9604848 (GAUZE/BANDAGES/DRESSINGS) ×2 IMPLANT
SUT MNCRL+ 5-0 UNDYED PC-3 (SUTURE) ×1 IMPLANT
SUT MONOCRYL 5-0 (SUTURE) ×1
SUT PROLENE 6 0 BV (SUTURE) ×8 IMPLANT
SUT SILK 2 0 (SUTURE) ×1
SUT SILK 2-0 18XBRD TIE 12 (SUTURE) ×1 IMPLANT
SUT SILK 3 0 (SUTURE) ×1
SUT SILK 3-0 18XBRD TIE 12 (SUTURE) ×1 IMPLANT
SUT SILK 4 0 (SUTURE) ×1
SUT SILK 4-0 18XBRD TIE 12 (SUTURE) ×1 IMPLANT
SUT VIC AB 3-0 SH 27 (SUTURE) ×1
SUT VIC AB 3-0 SH 27X BRD (SUTURE) ×1 IMPLANT
SYR 20ML LL LF (SYRINGE) ×2 IMPLANT
SYR 3ML LL SCALE MARK (SYRINGE) ×2 IMPLANT

## 2021-02-06 NOTE — Anesthesia Postprocedure Evaluation (Signed)
Anesthesia Post Note  Patient: Sharon Arias  Procedure(s) Performed: ARTERIOVENOUS (AV) FISTULA CREATION (BRACHIAL CEPHALIC ) (Left Arm Upper)  Patient location during evaluation: PACU Anesthesia Type: Regional Level of consciousness: awake Pain management: pain level controlled Vital Signs Assessment: post-procedure vital signs reviewed and stable Respiratory status: spontaneous breathing Cardiovascular status: stable Postop Assessment: no apparent nausea or vomiting Anesthetic complications: no   No complications documented.   Last Vitals:  Vitals:   02/06/21 1021 02/06/21 1054  BP: 136/64   Pulse: (!) 57   Resp: 17   Temp: (!) 36.3 C   SpO2: 95% 100%    Last Pain:  Vitals:   02/06/21 1054  TempSrc:   PainSc: 0-No pain                 Neva Seat

## 2021-02-06 NOTE — Interval H&P Note (Signed)
History and Physical Interval Note:  02/06/2021 7:22 AM  Sharon Arias  has presented today for surgery, with the diagnosis of ESRD.  The various methods of treatment have been discussed with the patient and family. After consideration of risks, benefits and other options for treatment, the patient has consented to  Procedure(s): ARTERIOVENOUS (AV) FISTULA CREATION (BRACHIAL CEPHALIC ) (Left) as a surgical intervention.  The patient's history has been reviewed, patient examined, no change in status, stable for surgery.  I have reviewed the patient's chart and labs.  Questions were answered to the patient's satisfaction.     [name]

## 2021-02-06 NOTE — Anesthesia Preprocedure Evaluation (Addendum)
Anesthesia Evaluation  Patient identified by MRN, date of birth, ID band Patient awake    Reviewed: Allergy & Precautions, H&P , NPO status , Patient's Chart, lab work & pertinent test results  History of Anesthesia Complications (+) PONV and history of anesthetic complications  Airway Mallampati: III       Dental  (+) Chipped   Pulmonary shortness of breath, sleep apnea , pneumonia,    Pulmonary exam normal breath sounds clear to auscultation       Cardiovascular hypertension, + CAD, + Past MI and +CHF  Normal cardiovascular exam+ Valvular Problems/Murmurs  Rhythm:Regular Rate:Normal     Neuro/Psych  Neuromuscular disease CVA negative psych ROS   GI/Hepatic negative GI ROS, Neg liver ROS,   Endo/Other  negative endocrine ROSdiabetes  Renal/GU Renal disease  negative genitourinary   Musculoskeletal  (+) Arthritis ,   Abdominal   Peds negative pediatric ROS (+)  Hematology negative hematology ROS (+)   Anesthesia Other Findings Past Medical History: 05/19/2016: Anemia in chronic kidney disease No date: Anxiety No date: Aortic atherosclerosis (HCC) No date: Blind No date: CAD (coronary artery disease) No date: Cardiac murmur No date: CHF (congestive heart failure) ([place])     Comment:  G2DD on 02/2020 TTE No date: Chicken pox No date: Chronic anticoagulation No date: CVA (cerebral vascular accident) ([place]) No date: DDD (degenerative disc disease), lumbar No date: Detached retina No date: Diabetic neuropathy ([place]) No date: Diabetic retinopathy ([place])     Comment:  legally blind No date: Dyspnea No date: ESRD needing dialysis ([place]) No date: Hx of CABG No date: Hyperlipidemia No date: Hypertension 06/2012: NSTEMI (non-ST elevated myocardial infarction) ([place]) No date: Obesity No date: Pneumonia No date: PONV (postoperative nausea and vomiting) No date: Sciatica of right side No date: Sleep apnea      Comment:  CPAP NIGHTLY No date: T2DM (type 2 diabetes mellitus) ([place])   Reproductive/Obstetrics negative OB ROS                            Anesthesia Physical Anesthesia Plan  ASA: III  Anesthesia Plan: General and Regional   Post-op Pain Management: GA combined w/ Regional for post-op pain   Induction: Intravenous  PONV Risk Score and Plan: 4 or greater and Propofol infusion  Airway Management Planned: Nasal Cannula  Additional Equipment:   Intra-op Plan:   Post-operative Plan:   Informed Consent: I have reviewed the patients History and Physical, chart, labs and discussed the procedure including the risks, benefits and alternatives for the proposed anesthesia with the patient or authorized representative who has indicated his/her understanding and acceptance.     Dental advisory given  Plan Discussed with: CRNA, Anesthesiologist and Surgeon  Anesthesia Plan Comments:         Anesthesia Quick Evaluation

## 2021-02-06 NOTE — Transfer of Care (Signed)
Immediate Anesthesia Transfer of Care Note  Patient: Sharon Arias  Procedure(s) Performed: ARTERIOVENOUS (AV) FISTULA CREATION (BRACHIAL CEPHALIC ) (Left Arm Upper)  Patient Location: PACU  Anesthesia Type:MAC  Level of Consciousness: awake, alert  and oriented  Airway & Oxygen Therapy: Patient Spontanous Breathing and Patient connected to face mask oxygen  Post-op Assessment: Report given to RN and Post -op Vital signs reviewed and stable  Post vital signs: Reviewed and stable  Last Vitals:  Vitals Value Taken Time  BP    Temp    Pulse    Resp    SpO2      Last Pain:  Vitals:   02/06/21 0638  TempSrc: Temporal  PainSc: 0-No pain         Complications: No complications documented.

## 2021-02-06 NOTE — Anesthesia Procedure Notes (Addendum)
Anesthesia Regional Block: Supraclavicular block   Pre-Anesthetic Checklist: ,, timeout performed, Correct Patient, Correct Site, Correct Laterality, Correct Procedure, Correct Position, site marked, Risks and benefits discussed,  Surgical consent,  Pre-op evaluation,  At surgeon's request and post-op pain management  Laterality: Left  Prep: chloraprep       Needles:  Injection technique: Single-shot  Needle Type: Stimiplex     Needle Length: 5cm  Needle Gauge: 22     Additional Needles:   Procedures:,,,, ultrasound used (permanent image in chart),,,,  Narrative:  Start time: 02/06/2021 7:10 AM End time: 02/06/2021 7:12 AM Injection made incrementally with aspirations every 5 mL.  Performed by: Personally   Additional Notes: Functioning IV was confirmed and monitors were applied.  A 16mm 22ga Stimuplex needle was used. Sterile prep and drape,hand hygiene and sterile gloves were used.  Negative aspiration and negative test dose prior to incremental administration of local anesthetic. The patient tolerated the procedure well.  TO: 0709 Local: For SCB - 0.5% Ropivacaine 79ml  [place]o infiltration for ICB block - 0.5% Ropivacaine 16ml

## 2021-02-06 NOTE — Op Note (Signed)
OPERATIVE NOTE   PROCEDURE: left brachial cephalic arteriovenous fistula placement  PRE-OPERATIVE DIAGNOSIS: End Stage Renal Disease  POST-OPERATIVE DIAGNOSIS: End Stage Renal Disease  SURGEON: [name]  ASSISTANT(S): Ms. Hezzie Bump  ANESTHESIA: regional  ESTIMATED BLOOD LOSS: <50 cc  FINDING(S): 4 mm cephalic vein  SPECIMEN(S):  none  INDICATIONS:   Sharon Arias is a 60 y.o. female who presents with end stage renal disease.  The patient is scheduled for left brachiocephalic arteriovenous fistula placement.  The patient is aware the risks include but are not limited to: bleeding, infection, steal syndrome, nerve damage, ischemic monomelic neuropathy, failure to mature, and need for additional procedures.  The patient is aware of the risks of the procedure and elects to proceed forward.  DESCRIPTION: After full informed written consent was obtained from the patient, the patient was brought back to the operating room and placed supine upon the operating table.  Prior to induction, the patient received IV antibiotics.   After obtaining adequate anesthesia, the patient was then prepped and draped in the standard fashion for a left arm access procedure.   A first assistant was required to provide a safe and appropriate environment for executing the surgery.  The assistant was integral in providing retraction, exposure, running suture providing suction and in the closing process.   A curvilinear incision was then created midway between the radial impulse and the cephalic vein. The cephalic vein was then identified and dissected circumferentially. It was marked with a surgical marker.    Attention was then turned to the brachial artery which was exposed through the same incision and looped proximally and distally. Side branches were controlled with 4-0 silk ties.  The distal segment of the vein was ligated with a  2-0 silk, and the vein was transected.  The proximal  segment was interrogated with serial dilators.  The vein accepted up to a 4 mm dilator without any difficulty. Heparinized saline was infused into the vein and clamped it with a small bulldog.  At this point, I reset my exposure of the brachial artery and controlled the artery with vessel loops proximally and distally.  An arteriotomy was then made with a #11 blade, and extended with a Potts scissor.  Heparinized saline was injected proximal and distal into the radial artery.  The vein was then approximated to the artery while the artery was in its native bed and subsequently the vein was beveled using Potts scissors. The vein was then sewn to the artery in an end-to-side configuration with a running stitch of 6-0 Prolene.  Prior to completing this anastomosis Flushing maneuvers were performed and the artery was allowed to forward and back bleed.  There was no evidence of clot from any vessels.  I completed the anastomosis in the usual fashion and then released all vessel loops and clamps.    There was good  thrill in the venous outflow, and there was 1+ palpable radial pulse.  I then addressed the very large side branch that was noted on the ultrasound.  Small incision was made just lateral to the cephalic vein and the dissection carried down to expose the sidebranch.  This was ligated with 3-0 silk tie.  The entire length of the skin edge was then infiltrated with Exparel both incisions were treated.  The small more proximal incision was then closed with a 4-0 Monocryl subcuticular.  At this point, I irrigated out the surgical wound.  There was no further active bleeding.  The subcutaneous tissue was reapproximated with a running stitch of 3-0 Vicryl.  The skin was then reapproximated with a running subcuticular stitch of 4-0 Vicryl.  The skin was then cleaned, dried, and reinforced with Dermabond.    The patient tolerated this procedure well.   COMPLICATIONS: None  CONDITION: Sharon Arias [facility]  Office: [phone]   02/06/2021, 9:26 AM

## 2021-02-06 NOTE — Progress Notes (Signed)
Bruit and thrill present in AV graft

## 2021-02-06 NOTE — Discharge Instructions (Signed)
Outpatient Surgery, Adult An outpatient surgery is a procedure that does not require an overnight stay at a hospital or clinic. A person having an outpatient surgery can go home hours after the surgery is complete. Tell a health care provider about:  Any allergies you have.  All medicines you are taking, including vitamins, supplements, herbs, eye drops, creams, and over-the-counter medicines.  Any problems you or family members have had with anesthetic medicines.  Any blood disorders you have.  Any surgeries you have had.  Any medical conditions you have.  Any use of tobacco products, alcohol, or drugs, including marijuana.  Whether you are pregnant or may be pregnant. What are the risks? The risks and complications of surgery depend on the specific procedure. Common risks and complications include:  Infection.  Bleeding.  Blood clots in the legs or lungs.  Allergic reactions to medicines or dyes.  Damage to nearby structures or organs.  Temporary increase in pain.  Failure to fix the problem that the surgery was meant to fix. What happens before the procedure? Staying hydrated Follow instructions from your health care provider about hydration, which may include:  Up to 2 hours before the procedure - you may continue to drink clear liquids, such as water, clear fruit juice, black coffee, and plain tea.   Eating and drinking restrictions Follow instructions from your health care provider about eating and drinking, which may include:  8 hours before the procedure - stop eating heavy meals or foods, such as meat, fried foods, or fatty foods.  6 hours before the procedure - stop eating light meals or foods, such as toast or cereal.  6 hours before the procedure - stop drinking milk or drinks that contain milk.  2 hours before the procedure - stop drinking clear liquids. Medicines  Ask your health care provider about: ? Changing or stopping your regular medicines.  This is especially important if you are taking diabetes medicines or blood thinners. ? Taking medicines such as aspirin and ibuprofen. These medicines can thin your blood. Do not take these medicines unless your health care provider tells you to take them. ? Taking over-the-counter medicines, vitamins, herbs, and supplements.  You may be asked to take medicines that help you have a bowel movement (laxatives) or to take antibiotics to help prevent infection.   General instructions  Do not use any products that contain nicotine or tobacco for at least 4 weeks before the procedure. These products include cigarettes, e-cigarettes, and chewing tobacco. If you need help quitting, ask your health care provider.  If told by your health care provider, bring your sleep apnea device with you on the day of your surgery.  Plan to have a responsible adult take you home from the hospital or clinic.  Plan to have a responsible adult care for you for the time you are told after you leave the hospital or clinic. This is important.  Ask your health care provider: ? How your surgery site will be marked. ? What steps will be taken to help prevent infection. These may include:  Removing hair at the surgery site.  Washing skin with a germ-killing soap.  Receiving an antibiotic medicine.  Call your health care provider if you develop an illness or a problem that may prevent you from safely having your procedure.  You may have an exam or testing.   What happens during the procedure?  An IV will be inserted into one of your veins.  You may be  given one or more of the following: ? A medicine to help you relax (sedative). ? A medicine to numb the area (local anesthetic). ? A medicine to make you fall asleep (general anesthetic). ? A medicine that is injected into your spine to numb the area below and slightly above the injection site (spinal anesthetic). ? A medicine that is injected into an area of your  body to numb everything below the injection site (regional anesthetic).  If any incisions are made during the procedure, they will be closed with stitches (sutures), staples, skin glue, or adhesive strips.  A bandage (dressing) may be applied over any sutures or other closures. The specifics of the procedure will depend on the type of procedure that you are having. The procedure may also vary among health care providers and hospitals. What happens after the procedure?  Your blood pressure, heart rate, breathing rate, and blood oxygen level will be monitored until you leave the hospital or clinic.  Do not drive or operate machinery until your health care provider says that it is safe.  If there are no complications, you will be allowed to go home with a responsible adult when you are awake, stable, and taking fluids well.  The surgical site will feel tender.  You may feel nauseous and have some swelling, bruising, and numbness around the surgical site.   Summary  An outpatient surgery is a procedure that does not require an overnight stay at a hospital or clinic. A person having an outpatient surgery can go home hours after the surgery is complete.  Follow instructions from your health care provider about eating and drinking before your surgery.  Ask your health care provider if you need to take any new medicines before surgery or if you need to change or stop your regular medicines.  Do not use any products that contain nicotine or tobacco for at least 4 weeks before the procedure.  Plan to have a responsible adult care for you for the time you are told after you leave the hospital or clinic. This is important. This information is not intended to replace advice given to you by your health care provider. Make sure you discuss any questions you have with your health care provider. Document Revised: 04/04/2020 Document Reviewed: 09/27/2019 Elsevier Patient Education  [place].

## 2021-02-07 ENCOUNTER — Encounter: Payer: Self-pay | Admitting: Vascular Surgery

## 2021-02-17 ENCOUNTER — Ambulatory Visit: Payer: Medicare Other | Admitting: Family

## 2021-02-23 ENCOUNTER — Telehealth: Payer: Self-pay | Admitting: *Deleted

## 2021-02-23 NOTE — Telephone Encounter (Signed)
-----   Message from Rochele Raring sent at 02/23/2021 11:40 AM EST ----- Regarding: Request to cancel infusion Patient stated that she started dialysis and the dialysis center has taken over all of her lab work and infusions. She has requested to cancel her upcoming appts.   Please advise, Thanks! Anderson Malta

## 2021-02-23 NOTE — Telephone Encounter (Signed)
Apt cnl per patient's request. Dr. Rogue Bussing made aware. vm for patient that apts have been cnl. Also sent mychart msg.

## 2021-02-27 ENCOUNTER — Ambulatory Visit: Payer: Medicare Other

## 2021-02-27 ENCOUNTER — Other Ambulatory Visit: Payer: Medicare Other

## 2021-02-27 ENCOUNTER — Other Ambulatory Visit (INDEPENDENT_AMBULATORY_CARE_PROVIDER_SITE_OTHER): Payer: Self-pay | Admitting: Vascular Surgery

## 2021-02-27 ENCOUNTER — Ambulatory Visit: Payer: Medicare Other | Admitting: Internal Medicine

## 2021-02-27 DIAGNOSIS — Z9889 Other specified postprocedural states: Secondary | ICD-10-CM

## 2021-02-27 DIAGNOSIS — N186 End stage renal disease: Secondary | ICD-10-CM

## 2021-03-02 ENCOUNTER — Other Ambulatory Visit: Payer: Self-pay

## 2021-03-02 ENCOUNTER — Ambulatory Visit (INDEPENDENT_AMBULATORY_CARE_PROVIDER_SITE_OTHER): Payer: Medicare Other | Admitting: Vascular Surgery

## 2021-03-02 ENCOUNTER — Ambulatory Visit (INDEPENDENT_AMBULATORY_CARE_PROVIDER_SITE_OTHER): Payer: Medicare Other

## 2021-03-02 DIAGNOSIS — Z9889 Other specified postprocedural states: Secondary | ICD-10-CM | POA: Diagnosis not present

## 2021-03-02 DIAGNOSIS — N186 End stage renal disease: Secondary | ICD-10-CM

## 2021-03-02 DIAGNOSIS — Z992 Dependence on renal dialysis: Secondary | ICD-10-CM | POA: Insufficient documentation

## 2021-03-02 NOTE — Progress Notes (Signed)
Patient ID: Sharon Arias, female   DOB: 04-01-1961, 60 y.o.   MRN: [id number]  No chief complaint on file.   HPI Sharon Arias is a 60 y.o. female.    She is s/p left brachial cephalic arteriovenous fistula placement on 02/06/2021.  No complaints denies hand pain  HDA shows a widely patent fistula   Past Medical History:  Diagnosis Date   Anemia in chronic kidney disease 05/19/2016   Anxiety    Aortic atherosclerosis (HCC)    Blind    CAD (coronary artery disease)    Cardiac murmur    CHF (congestive heart failure) ([place])    G2DD on 02/2020 TTE   Chicken pox    Chronic anticoagulation    CVA (cerebral vascular accident) ([place])    DDD (degenerative disc disease), lumbar    Detached retina    Diabetic neuropathy ([place])    Diabetic retinopathy ([place])    legally blind   Dyspnea    ESRD needing dialysis ([place])    Hx of CABG    Hyperlipidemia    Hypertension    NSTEMI (non-ST elevated myocardial infarction) ([place]) 06/2012   Obesity    Pneumonia    PONV (postoperative nausea and vomiting)    Sciatica of right side    Sleep apnea    CPAP NIGHTLY   T2DM (type 2 diabetes mellitus) ([place])     Past Surgical History:  Procedure Laterality Date   AV FISTULA PLACEMENT Left 02/06/2021   Procedure: ARTERIOVENOUS (AV) FISTULA CREATION (BRACHIAL CEPHALIC );  Surgeon: Katha Cabal, MD;  Location: [facility] ORS;  Service: Vascular;  Laterality: Left;   COLONOSCOPY WITH PROPOFOL N/A 01/26/2016   Procedure: COLONOSCOPY WITH PROPOFOL;  Surgeon: Lollie Sails, MD;  Location: [facility] ENDOSCOPY;  Service: Endoscopy;  Laterality: N/A;   COLONOSCOPY WITH PROPOFOL N/A 01/27/2016   Procedure: COLONOSCOPY WITH PROPOFOL;  Surgeon: Lollie Sails, MD;  Location: [facility] ENDOSCOPY;  Service: Endoscopy;  Laterality: N/A;   CORONARY ARTERY BYPASS GRAFT  2013   DIALYSIS/PERMA CATHETER INSERTION N/A 02/03/2021   Procedure: DIALYSIS/PERMA CATHETER INSERTION;  Surgeon:  Katha Cabal, MD;  Location: [facility];  Service: Cardiovascular;  Laterality: N/A;   INCISION AND DRAINAGE     chest abscess   INCISION AND DRAINAGE ABSCESS N/A 12/11/2018   Procedure: INCISION AND DRAINAGE PERINEAL;  Surgeon: Jules Husbands, MD;  Location: [facility] ORS;  Service: General;  Laterality: N/A;   RETINAL LASER PROCEDURE        Allergies  Allergen Reactions   Ozempic (0.25 Or 0.5 Mg-Dose) [Semaglutide(0.25 Or 0.5mg -Dos)] Diarrhea and Nausea Only   Trulicity [Dulaglutide] Diarrhea and Nausea Only    Current Outpatient Medications  Medication Sig Dispense Refill   allopurinol (ZYLOPRIM) 300 MG tablet Take 300 mg by mouth daily.     amLODipine (NORVASC) 10 MG tablet Take 10 mg by mouth at bedtime.     aspirin EC 81 MG tablet Take 81 mg by mouth every morning.     calcitRIOL (ROCALTROL) 0.25 MCG capsule Take 0.25 mcg by mouth daily.     clopidogrel (PLAVIX) 75 MG tablet Take 1 tablet (75 mg total) by mouth daily. 30 tablet 0   furosemide (LASIX) 20 MG tablet Take 1 tablet (20 mg total) by mouth every other day. 30 tablet 0   hydrALAZINE (APRESOLINE) 25 MG tablet Take 1 tablet (25 mg total) by mouth 2 (two) times daily at 10 AM and 5 PM. (Patient  taking differently: Take 50 mg by mouth 2 (two) times daily at 10 AM and 5 PM.) 60 tablet 0   HYDROcodone-acetaminophen (NORCO) 5-325 MG tablet Take 1-2 tablets by mouth every 6 (six) hours as needed for moderate pain or severe pain. 25 tablet 0   insulin glargine (LANTUS SOLOSTAR) 100 UNIT/ML Solostar Pen Inject 57 Units into the skin at bedtime. (Patient taking differently: Inject 70 Units into the skin at bedtime.) 15 mL 11   insulin lispro (HUMALOG) 100 UNIT/ML KwikPen Inject 15-40 Units into the skin See admin instructions. Pt. Uses 15 units/ml in the mornings, 30units/ml in the afternoon, and 40 units/ml at bedtime     isosorbide mononitrate (IMDUR) 30 MG 24 hr tablet Take 1 tablet (30 mg total) by mouth  daily. 30 tablet 0   losartan (COZAAR) 100 MG tablet Take 100 mg by mouth daily.     metoprolol tartrate (LOPRESSOR) 50 MG tablet Take 50 mg by mouth 2 (two) times daily.      ONETOUCH VERIO test strip      rosuvastatin (CRESTOR) 20 MG tablet Take 20 mg by mouth at bedtime.      No current facility-administered medications for this visit.        Physical Exam There were no vitals taken for this visit. Gen:  WD/WN, NAD Skin: incision C/D/I; fistula good thrill good bruit     Assessment/Plan: 1. End stage renal disease [facility]) Doing well follow up one month       Sharon Arias 03/02/2021, 11:58 AM   This note was created with Dragon medical transcription system.  Any errors from dictation are unintentional.

## 2021-03-06 ENCOUNTER — Encounter (INDEPENDENT_AMBULATORY_CARE_PROVIDER_SITE_OTHER): Payer: Self-pay | Admitting: Vascular Surgery

## 2021-04-01 NOTE — H&P (View-Only) (Signed)
Patient ID: Sharon Arias, female   DOB: 04-May-1961, 60 y.o.   MRN: [id number]  No chief complaint on file.   HPI Sharon Arias is a 60 y.o. female.    She is s/p leftbrachial cephalic arteriovenous fistula placement on 02/06/2021.  No complaints denies hand pain;  HDA shows a widely patent fistula   Past Medical History:  Diagnosis Date  . Anemia in chronic kidney disease 05/19/2016  . Anxiety   . Aortic atherosclerosis ([place])   . Blind   . CAD (coronary artery disease)   . Cardiac murmur   . CHF (congestive heart failure) ([place])    G2DD on 02/2020 TTE  . Chicken pox   . Chronic anticoagulation   . CVA (cerebral vascular accident) ([place])   . DDD (degenerative disc disease), lumbar   . Detached retina   . Diabetic neuropathy ([place])   . Diabetic retinopathy ([place])    legally blind  . Dyspnea   . ESRD needing dialysis ([place])   . Hx of CABG   . Hyperlipidemia   . Hypertension   . NSTEMI (non-ST elevated myocardial infarction) ([place]) 06/2012  . Obesity   . Pneumonia   . PONV (postoperative nausea and vomiting)   . Sciatica of right side   . Sleep apnea    CPAP NIGHTLY  . T2DM (type 2 diabetes mellitus) ([place])     Past Surgical History:  Procedure Laterality Date  . AV FISTULA PLACEMENT Left 02/06/2021   Procedure: ARTERIOVENOUS (AV) FISTULA CREATION (BRACHIAL CEPHALIC );  Surgeon: Katha Cabal, MD;  Location: [facility] ORS;  Service: Vascular;  Laterality: Left;  . COLONOSCOPY WITH PROPOFOL N/A 01/26/2016   Procedure: COLONOSCOPY WITH PROPOFOL;  Surgeon: Lollie Sails, MD;  Location: [facility] ENDOSCOPY;  Service: Endoscopy;  Laterality: N/A;  . COLONOSCOPY WITH PROPOFOL N/A 01/27/2016   Procedure: COLONOSCOPY WITH PROPOFOL;  Surgeon: Lollie Sails, MD;  Location: [facility] ENDOSCOPY;  Service: Endoscopy;  Laterality: N/A;  . CORONARY ARTERY BYPASS GRAFT  2013  . DIALYSIS/PERMA CATHETER INSERTION N/A 02/03/2021   Procedure: DIALYSIS/PERMA CATHETER INSERTION;  Surgeon:  Katha Cabal, MD;  Location: [facility] CV LAB;  Service: Cardiovascular;  Laterality: N/A;  . INCISION AND DRAINAGE     chest abscess  . INCISION AND DRAINAGE ABSCESS N/A 12/11/2018   Procedure: INCISION AND DRAINAGE PERINEAL;  Surgeon: Jules Husbands, MD;  Location: [facility] ORS;  Service: General;  Laterality: N/A;  . RETINAL LASER PROCEDURE        Allergies  Allergen Reactions  . Ozempic (0.25 Or 0.5 Mg-Dose) [Semaglutide(0.25 Or 0.5mg -Dos)] Diarrhea and Nausea Only  . Trulicity [Dulaglutide] Diarrhea and Nausea Only    Current Outpatient Medications  Medication Sig Dispense Refill  . allopurinol (ZYLOPRIM) 300 MG tablet Take 300 mg by mouth daily.    [name] amLODipine (NORVASC) 10 MG tablet Take 10 mg by mouth at bedtime.    [name] aspirin EC 81 MG tablet Take 81 mg by mouth every morning.    . calcitRIOL (ROCALTROL) 0.25 MCG capsule Take 0.25 mcg by mouth daily. (Patient not taking: Reported on 03/02/2021)    . clopidogrel (PLAVIX) 75 MG tablet Take 1 tablet (75 mg total) by mouth daily. 30 tablet 0  . furosemide (LASIX) 20 MG tablet Take 1 tablet (20 mg total) by mouth every other day. (Patient not taking: Reported on 03/02/2021) 30 tablet 0  . hydrALAZINE (APRESOLINE) 25 MG tablet Take 1 tablet (25 mg total) by mouth  2 (two) times daily at 10 AM and 5 PM. (Patient taking differently: Take 50 mg by mouth 2 (two) times daily at 10 AM and 5 PM.) 60 tablet 0  . HYDROcodone-acetaminophen (NORCO) 5-325 MG tablet Take 1-2 tablets by mouth every 6 (six) hours as needed for moderate pain or severe pain. (Patient not taking: Reported on 03/02/2021) 25 tablet 0  . insulin glargine (LANTUS SOLOSTAR) 100 UNIT/ML Solostar Pen Inject 57 Units into the skin at bedtime. (Patient taking differently: Inject 70 Units into the skin at bedtime.) 15 mL 11  . insulin lispro (HUMALOG) 100 UNIT/ML KwikPen Inject 15-40 Units into the skin See admin instructions. Pt. Uses 15 units/ml in the mornings, 30units/ml in  the afternoon, and 40 units/ml at bedtime    . isosorbide mononitrate (IMDUR) 30 MG 24 hr tablet Take 1 tablet (30 mg total) by mouth daily. (Patient not taking: Reported on 03/02/2021) 30 tablet 0  . losartan (COZAAR) 100 MG tablet Take 100 mg by mouth daily.    . metoprolol tartrate (LOPRESSOR) 50 MG tablet Take 50 mg by mouth 2 (two) times daily.     . multivitamin (RENA-VIT) TABS tablet Take 1 tablet by mouth daily.    Glory Rosebush VERIO test strip     . rosuvastatin (CRESTOR) 20 MG tablet Take 20 mg by mouth at bedtime.      No current facility-administered medications for this visit.        Physical Exam There were no vitals taken for this visit. Gen:  WD/WN, NAD Skin: incision C/D/I Dialysis access: Weak thrill poor bruit     Assessment/Plan: 1. End stage renal disease ([place]) Recommend:  The patient is experiencing increasing problems with their dialysis access.  Patient should have a left fistulagram with the intention for intervention.  The intention for intervention is to restore appropriate flow and prevent thrombosis and possible loss of the access.  As well as improve the quality of dialysis therapy.  The risks, benefits and alternative therapies were reviewed in detail with the patient.  All questions were answered.  The patient agrees to proceed with left fistula angio/intervention.      2. Complication from renal dialysis device, sequela Recommend:  The patient is experiencing increasing problems with their dialysis access.  Patient should have a left fistulagram with the intention for intervention.  The intention for intervention is to restore appropriate flow and prevent thrombosis and possible loss of the access.  As well as improve the quality of dialysis therapy.  The risks, benefits and alternative therapies were reviewed in detail with the patient.  All questions were answered.  The patient agrees to proceed with left fistula angio/intervention.         [name] 04/01/2021, 9:58 PM   This note was created with Dragon medical transcription system.  Any errors from dictation are unintentional.

## 2021-04-01 NOTE — Progress Notes (Signed)
Patient ID: Sharon Arias, female   DOB: 1961-02-04, 60 y.o.   MRN: [id number]  No chief complaint on file.   HPI Sharon Arias is a 60 y.o. female.    She is s/p leftbrachial cephalic arteriovenous fistula placement on 02/06/2021.  No complaints denies hand pain;  HDA shows a widely patent fistula   Past Medical History:  Diagnosis Date  . Anemia in chronic kidney disease 05/19/2016  . Anxiety   . Aortic atherosclerosis ([place])   . Blind   . CAD (coronary artery disease)   . Cardiac murmur   . CHF (congestive heart failure) ([place])    G2DD on 02/2020 TTE  . Chicken pox   . Chronic anticoagulation   . CVA (cerebral vascular accident) ([place])   . DDD (degenerative disc disease), lumbar   . Detached retina   . Diabetic neuropathy ([place])   . Diabetic retinopathy ([place])    legally blind  . Dyspnea   . ESRD needing dialysis ([place])   . Hx of CABG   . Hyperlipidemia   . Hypertension   . NSTEMI (non-ST elevated myocardial infarction) ([place]) 06/2012  . Obesity   . Pneumonia   . PONV (postoperative nausea and vomiting)   . Sciatica of right side   . Sleep apnea    CPAP NIGHTLY  . T2DM (type 2 diabetes mellitus) ([place])     Past Surgical History:  Procedure Laterality Date  . AV FISTULA PLACEMENT Left 02/06/2021   Procedure: ARTERIOVENOUS (AV) FISTULA CREATION (BRACHIAL CEPHALIC );  Surgeon: Katha Cabal, MD;  Location: [facility] ORS;  Service: Vascular;  Laterality: Left;  . COLONOSCOPY WITH PROPOFOL N/A 01/26/2016   Procedure: COLONOSCOPY WITH PROPOFOL;  Surgeon: Lollie Sails, MD;  Location: [facility] ENDOSCOPY;  Service: Endoscopy;  Laterality: N/A;  . COLONOSCOPY WITH PROPOFOL N/A 01/27/2016   Procedure: COLONOSCOPY WITH PROPOFOL;  Surgeon: Lollie Sails, MD;  Location: [facility] ENDOSCOPY;  Service: Endoscopy;  Laterality: N/A;  . CORONARY ARTERY BYPASS GRAFT  2013  . DIALYSIS/PERMA CATHETER INSERTION N/A 02/03/2021   Procedure: DIALYSIS/PERMA CATHETER INSERTION;  Surgeon:  Katha Cabal, MD;  Location: [place] CV LAB;  Service: Cardiovascular;  Laterality: N/A;  . INCISION AND DRAINAGE     chest abscess  . INCISION AND DRAINAGE ABSCESS N/A 12/11/2018   Procedure: INCISION AND DRAINAGE PERINEAL;  Surgeon: Jules Husbands, MD;  Location: [facility] ORS;  Service: General;  Laterality: N/A;  . RETINAL LASER PROCEDURE        Allergies  Allergen Reactions  . Ozempic (0.25 Or 0.5 Mg-Dose) [Semaglutide(0.25 Or 0.5mg -Dos)] Diarrhea and Nausea Only  . Trulicity [Dulaglutide] Diarrhea and Nausea Only    Current Outpatient Medications  Medication Sig Dispense Refill  . allopurinol (ZYLOPRIM) 300 MG tablet Take 300 mg by mouth daily.    [name] amLODipine (NORVASC) 10 MG tablet Take 10 mg by mouth at bedtime.    [name] aspirin EC 81 MG tablet Take 81 mg by mouth every morning.    . calcitRIOL (ROCALTROL) 0.25 MCG capsule Take 0.25 mcg by mouth daily. (Patient not taking: Reported on 03/02/2021)    . clopidogrel (PLAVIX) 75 MG tablet Take 1 tablet (75 mg total) by mouth daily. 30 tablet 0  . furosemide (LASIX) 20 MG tablet Take 1 tablet (20 mg total) by mouth every other day. (Patient not taking: Reported on 03/02/2021) 30 tablet 0  . hydrALAZINE (APRESOLINE) 25 MG tablet Take 1 tablet (25 mg total) by mouth  2 (two) times daily at 10 AM and 5 PM. (Patient taking differently: Take 50 mg by mouth 2 (two) times daily at 10 AM and 5 PM.) 60 tablet 0  . HYDROcodone-acetaminophen (NORCO) 5-325 MG tablet Take 1-2 tablets by mouth every 6 (six) hours as needed for moderate pain or severe pain. (Patient not taking: Reported on 03/02/2021) 25 tablet 0  . insulin glargine (LANTUS SOLOSTAR) 100 UNIT/ML Solostar Pen Inject 57 Units into the skin at bedtime. (Patient taking differently: Inject 70 Units into the skin at bedtime.) 15 mL 11  . insulin lispro (HUMALOG) 100 UNIT/ML KwikPen Inject 15-40 Units into the skin See admin instructions. Pt. Uses 15 units/ml in the mornings, 30units/ml in  the afternoon, and 40 units/ml at bedtime    . isosorbide mononitrate (IMDUR) 30 MG 24 hr tablet Take 1 tablet (30 mg total) by mouth daily. (Patient not taking: Reported on 03/02/2021) 30 tablet 0  . losartan (COZAAR) 100 MG tablet Take 100 mg by mouth daily.    . metoprolol tartrate (LOPRESSOR) 50 MG tablet Take 50 mg by mouth 2 (two) times daily.     . multivitamin (RENA-VIT) TABS tablet Take 1 tablet by mouth daily.    Glory Rosebush VERIO test strip     . rosuvastatin (CRESTOR) 20 MG tablet Take 20 mg by mouth at bedtime.      No current facility-administered medications for this visit.        Physical Exam There were no vitals taken for this visit. Gen:  WD/WN, NAD Skin: incision C/D/I Dialysis access: Weak thrill poor bruit     Assessment/Plan: 1. End stage renal disease ([place]) Recommend:  The patient is experiencing increasing problems with their dialysis access.  Patient should have a left fistulagram with the intention for intervention.  The intention for intervention is to restore appropriate flow and prevent thrombosis and possible loss of the access.  As well as improve the quality of dialysis therapy.  The risks, benefits and alternative therapies were reviewed in detail with the patient.  All questions were answered.  The patient agrees to proceed with left fistula angio/intervention.      2. Complication from renal dialysis device, sequela Recommend:  The patient is experiencing increasing problems with their dialysis access.  Patient should have a left fistulagram with the intention for intervention.  The intention for intervention is to restore appropriate flow and prevent thrombosis and possible loss of the access.  As well as improve the quality of dialysis therapy.  The risks, benefits and alternative therapies were reviewed in detail with the patient.  All questions were answered.  The patient agrees to proceed with left fistula angio/intervention.         Sharon Arias 04/01/2021, 9:58 PM   This note was created with Dragon medical transcription system.  Any errors from dictation are unintentional.

## 2021-04-02 ENCOUNTER — Encounter (INDEPENDENT_AMBULATORY_CARE_PROVIDER_SITE_OTHER): Payer: Self-pay | Admitting: Vascular Surgery

## 2021-04-02 ENCOUNTER — Ambulatory Visit (INDEPENDENT_AMBULATORY_CARE_PROVIDER_SITE_OTHER): Payer: Medicare Other | Admitting: Vascular Surgery

## 2021-04-02 ENCOUNTER — Other Ambulatory Visit: Payer: Self-pay

## 2021-04-02 VITALS — BP 167/66 | HR 65 | Ht 67.0 in | Wt 257.0 lb

## 2021-04-02 DIAGNOSIS — N186 End stage renal disease: Secondary | ICD-10-CM

## 2021-04-02 DIAGNOSIS — T829XXS Unspecified complication of cardiac and vascular prosthetic device, implant and graft, sequela: Secondary | ICD-10-CM

## 2021-04-15 ENCOUNTER — Telehealth (INDEPENDENT_AMBULATORY_CARE_PROVIDER_SITE_OTHER): Payer: Self-pay

## 2021-04-15 DIAGNOSIS — T829XXA Unspecified complication of cardiac and vascular prosthetic device, implant and graft, initial encounter: Secondary | ICD-10-CM | POA: Insufficient documentation

## 2021-04-15 NOTE — Telephone Encounter (Signed)
Spoke with Celeste at [facility] and the patient is is scheduled with Dr. Delana Meyer for a left arm fistulagram on 04/21/21 with a  1:15 pm to the MM. Covid testing on 04/17/21 between 8-2 pm at the [facility]. Pre-procedure instructions will be faxed to [facility] at [facility].

## 2021-04-17 ENCOUNTER — Other Ambulatory Visit
Admission: RE | Admit: 2021-04-17 | Discharge: 2021-04-17 | Disposition: A | Discharge status: Home / Self Care | Payer: Medicare Other | Source: Ambulatory Visit | Attending: Vascular Surgery | Admitting: Vascular Surgery

## 2021-04-17 ENCOUNTER — Other Ambulatory Visit: Payer: Self-pay

## 2021-04-17 DIAGNOSIS — Z01812 Encounter for preprocedural laboratory examination: Secondary | ICD-10-CM | POA: Diagnosis present

## 2021-04-17 DIAGNOSIS — Z20822 Contact with and (suspected) exposure to covid-19: Secondary | ICD-10-CM | POA: Insufficient documentation

## 2021-04-17 LAB — SARS CORONAVIRUS 2 (TAT 6-24 HRS): SARS Coronavirus 2: NEGATIVE

## 2021-04-21 ENCOUNTER — Other Ambulatory Visit: Payer: Self-pay

## 2021-04-21 ENCOUNTER — Encounter: Payer: Self-pay | Admitting: Vascular Surgery

## 2021-04-21 ENCOUNTER — Ambulatory Visit
Admission: RE | Admit: 2021-04-21 | Discharge: 2021-04-21 | Disposition: A | Discharge status: Home / Self Care | Payer: Medicare Other | Attending: Vascular Surgery | Admitting: Vascular Surgery

## 2021-04-21 ENCOUNTER — Other Ambulatory Visit (INDEPENDENT_AMBULATORY_CARE_PROVIDER_SITE_OTHER): Payer: Self-pay | Admitting: Nurse Practitioner

## 2021-04-21 ENCOUNTER — Encounter
Admission: RE | Disposition: A | Discharge status: Home / Self Care | Payer: Self-pay | Source: Home / Self Care | Attending: Vascular Surgery

## 2021-04-21 DIAGNOSIS — Z992 Dependence on renal dialysis: Secondary | ICD-10-CM | POA: Insufficient documentation

## 2021-04-21 DIAGNOSIS — T82858A Stenosis of vascular prosthetic devices, implants and grafts, initial encounter: Secondary | ICD-10-CM | POA: Diagnosis not present

## 2021-04-21 DIAGNOSIS — Z794 Long term (current) use of insulin: Secondary | ICD-10-CM | POA: Insufficient documentation

## 2021-04-21 DIAGNOSIS — I132 Hypertensive heart and chronic kidney disease with heart failure and with stage 5 chronic kidney disease, or end stage renal disease: Secondary | ICD-10-CM | POA: Diagnosis not present

## 2021-04-21 DIAGNOSIS — T82898A Other specified complication of vascular prosthetic devices, implants and grafts, initial encounter: Secondary | ICD-10-CM

## 2021-04-21 DIAGNOSIS — N186 End stage renal disease: Secondary | ICD-10-CM

## 2021-04-21 DIAGNOSIS — Y841 Kidney dialysis as the cause of abnormal reaction of the patient, or of later complication, without mention of misadventure at the time of the procedure: Secondary | ICD-10-CM | POA: Diagnosis not present

## 2021-04-21 DIAGNOSIS — Z7982 Long term (current) use of aspirin: Secondary | ICD-10-CM | POA: Insufficient documentation

## 2021-04-21 DIAGNOSIS — E1122 Type 2 diabetes mellitus with diabetic chronic kidney disease: Secondary | ICD-10-CM | POA: Diagnosis not present

## 2021-04-21 DIAGNOSIS — Z79899 Other long term (current) drug therapy: Secondary | ICD-10-CM | POA: Diagnosis not present

## 2021-04-21 DIAGNOSIS — I509 Heart failure, unspecified: Secondary | ICD-10-CM | POA: Diagnosis not present

## 2021-04-21 DIAGNOSIS — Z7902 Long term (current) use of antithrombotics/antiplatelets: Secondary | ICD-10-CM | POA: Insufficient documentation

## 2021-04-21 HISTORY — PX: A/V FISTULAGRAM: CATH118298

## 2021-04-21 LAB — GLUCOSE, CAPILLARY: Glucose-Capillary: 203 mg/dL — ABNORMAL HIGH (ref 70–99)

## 2021-04-21 LAB — POTASSIUM (ARMC VASCULAR LAB ONLY): Potassium (ARMC vascular lab): 4.2 (ref 3.5–5.1)

## 2021-04-21 SURGERY — A/V FISTULAGRAM
Anesthesia: Moderate Sedation | Laterality: Left

## 2021-04-21 MED ORDER — DIPHENHYDRAMINE HCL 50 MG/ML IJ SOLN: 50.0000 mg | Freq: Once | INTRAMUSCULAR | Status: DC | PRN

## 2021-04-21 MED ORDER — MIDAZOLAM HCL 2 MG/2ML IJ SOLN
INTRAMUSCULAR | Status: DC | PRN
  Administered 2021-04-21 (×3): 1 mg via INTRAVENOUS

## 2021-04-21 MED ORDER — MIDAZOLAM HCL 2 MG/ML PO SYRP: 8.0000 mg | ORAL_SOLUTION | Freq: Once | ORAL | Status: DC | PRN

## 2021-04-21 MED ORDER — METHYLPREDNISOLONE SODIUM SUCC 125 MG IJ SOLR: 125.0000 mg | Freq: Once | INTRAMUSCULAR | Status: DC | PRN

## 2021-04-21 MED ORDER — HEPARIN SODIUM (PORCINE) 10000 UNIT/ML IJ SOLN
INTRAMUSCULAR | Status: AC
  Filled 2021-04-21: qty 1

## 2021-04-21 MED ORDER — MIDAZOLAM HCL 2 MG/2ML IJ SOLN
INTRAMUSCULAR | Status: AC
  Filled 2021-04-21: qty 2

## 2021-04-21 MED ORDER — FENTANYL CITRATE (PF) 100 MCG/2ML IJ SOLN
INTRAMUSCULAR | Status: AC
  Filled 2021-04-21: qty 2

## 2021-04-21 MED ORDER — FAMOTIDINE 20 MG PO TABS: 40.0000 mg | ORAL_TABLET | Freq: Once | ORAL | Status: DC | PRN

## 2021-04-21 MED ORDER — FENTANYL CITRATE (PF) 100 MCG/2ML IJ SOLN
INTRAMUSCULAR | Status: DC | PRN
  Administered 2021-04-21 (×2): 50 ug via INTRAVENOUS
  Administered 2021-04-21: 25 ug via INTRAVENOUS

## 2021-04-21 MED ORDER — HEPARIN SODIUM (PORCINE) 1000 UNIT/ML IJ SOLN
INTRAMUSCULAR | Status: AC
  Filled 2021-04-21: qty 1

## 2021-04-21 MED ORDER — SODIUM CHLORIDE 0.9 % IV SOLN
INTRAVENOUS | Status: AC
  Administered 2021-04-21: 1 g
  Filled 2021-04-21: qty 10

## 2021-04-21 MED ORDER — IODIXANOL 320 MG/ML IV SOLN
INTRAVENOUS | Status: DC | PRN
  Administered 2021-04-21: 40 mL

## 2021-04-21 MED ORDER — ONDANSETRON HCL 4 MG/2ML IJ SOLN: 4.0000 mg | Freq: Four times a day (QID) | INTRAMUSCULAR | Status: DC | PRN

## 2021-04-21 MED ORDER — CEFAZOLIN SODIUM-DEXTROSE 1-4 GM/50ML-% IV SOLN: 1.0000 g | Freq: Once | INTRAVENOUS | Status: DC

## 2021-04-21 MED ORDER — HYDROMORPHONE HCL 1 MG/ML IJ SOLN: 1.0000 mg | Freq: Once | INTRAMUSCULAR | Status: DC | PRN

## 2021-04-21 MED ORDER — SODIUM CHLORIDE 0.9 % IV SOLN: INTRAVENOUS | Status: DC

## 2021-04-21 SURGICAL SUPPLY — 18 items
BALLN DORADO 7X20X80 (BALLOONS) ×2
BALLN MUSTANG 8X20X75 (BALLOONS) ×2
BALLOON DORADO 7X20X80 (BALLOONS) ×1 IMPLANT
BALLOON MUSTANG 8X20X75 (BALLOONS) ×1 IMPLANT
BIOPATCH RED 1 DISK 7.0 (GAUZE/BANDAGES/DRESSINGS) ×2 IMPLANT
CANNULA 5F STIFF (CANNULA) ×2 IMPLANT
CATH KUMPE SOFT-VU 5FR 65 (CATHETERS) ×2 IMPLANT
CATH PALIN MAXID VT KIT 19CM (CATHETERS) ×2 IMPLANT
COVER PROBE U/S 5X48 (MISCELLANEOUS) ×2 IMPLANT
DRAPE BRACHIAL (DRAPES) ×2 IMPLANT
GUIDEWIRE ANGLED .035 180CM (WIRE) ×2 IMPLANT
GUIDEWIRE SUPER STIFF .035X180 (WIRE) ×2 IMPLANT
GUIDEWIRE VERSACORE 175CM (WIRE) ×2 IMPLANT
KIT ENCORE 26 ADVANTAGE (KITS) ×2 IMPLANT
PACK ANGIOGRAPHY (CUSTOM PROCEDURE TRAY) ×2 IMPLANT
SHEATH BRITE TIP 6FRX5.5 (SHEATH) ×2 IMPLANT
SUT MNCRL AB 4-0 PS2 18 (SUTURE) ×2 IMPLANT
SUT SILK 0 FSL (SUTURE) ×2 IMPLANT

## 2021-04-21 NOTE — Op Note (Signed)
OPERATIVE NOTE   PROCEDURE: 1. Contrast injection left brachiocephalic AV access 2. Percutaneous transluminal angioplasty peripheral segment left brachiocephalic fistula to 8 mm. 3. Exchange right IJ tunneled catheter same venous access  PRE-OPERATIVE DIAGNOSIS: Complication of dialysis access with failure of fistula to mature; complication dialysis access with thrombosis of tunneled catheter;  End Stage Renal Disease  POST-OPERATIVE DIAGNOSIS: same as above   SURGEON: Katha Cabal, M.D.  ANESTHESIA: Conscious sedation was administered under my direct supervision by the interventional radiology RN. IV Versed plus fentanyl were utilized. Continuous ECG, pulse oximetry and blood pressure was monitored throughout the entire procedure.  Conscious sedation was for a total of 1 hour 10 minutes 34 seconds.  ESTIMATED BLOOD LOSS: minimal  FINDING(S): Stricture of the AV graft  SPECIMEN(S):  None  CONTRAST: 40 cc  FLUOROSCOPY TIME: 3.7 minutes  INDICATIONS: Sharon Arias is a 60 y.o. female who  presents with malfunctioning tunneled catheter as well as a left brachiocephalic AV access that is not maturing.  The patient is scheduled for angiography with possible intervention of the AV access.  The patient is aware the risks include but are not limited to: bleeding, infection, thrombosis of the cannulated access, and possible anaphylactic reaction to the contrast.  The patient is aware of the risks of the procedure and elects to proceed with the angiogram and intervention.  DESCRIPTION: After full informed written consent was obtained, the patient was brought back to the Special Procedure suite and placed supine position.  Appropriate cardiopulmonary monitors were placed.  The left arm was prepped and draped in the standard fashion.  Appropriate timeout is called. The left brachiocephalic fistula was cannulated with a micropuncture needle.  Cannulation was performed with ultrasound  guidance. Ultrasound was placed in a sterile sleeve, the AV access was interrogated and noted to be echolucent and compressible indicating patency. Image was recorded for the permanent record. The puncture is performed under continuous ultrasound visualization.   The microwire was advanced and the needle was exchanged for  a microsheath.  The J-wire was then advanced and a 6 Fr sheath inserted.  Hand injections were completed to image the access from the arterial anastomosis through the entire access.  The central venous structures were also imaged by hand injections.  Diagnostic interpretation: Initial images of the AV fistula showed the vein to be 7 to 10 mm throughout its course in the upper extremity.  Cephalic confluence is widely patent.  Central veins are all widely patent.  Reflux imaging demonstrates a focal ringlike stricture consistent with a sclerotic valve approximately 2 cm above the anastomosis.  The anastomosis and visualized portions of the brachial artery are widely patent.  Stricture of fistula greater than 80% focal over 3 to 4 mm in length  Given this finding the sheath was initially in the antegrade direction I was able to flip the sheath around into the retrograde direction pointing toward the anastomosis subsequently a versa core wire was negotiated into the distal brachial artery initially a 7 mm x 20 mm Ultraverse balloon was used to angioplasty this lesion.  Follow-up imaging demonstrated there was still some residual stenosis greater than 20% and therefore an 8 mm x 20 mm Mustang balloon was advanced across the lesion inflated to 12 atm for 1 minute.  Follow-up imaging was then performed.  Follow-up imaging demonstrates complete resolution (this correlates with less than 5% residual stenosis) of the stricture with rapid flow of contrast through the graft, the central venous anatomy  is preserved.  A 4-0 Monocryl purse-string suture was sewn around the sheath.  The sheath was  removed and light pressure was applied.  A sterile bandage was applied to the puncture site.  Attention was then turned to the tunnel catheter.  Her right neck and chest wall were prepped and draped in a sterile fashion.  1% lidocaine was infiltrated into the soft tissues surrounding the catheter the cuff was palpated approximately 2 cm from the exit site.  Using a scalpel scissors and a hemostat the cuff was freed from the surrounding tissues.  The catheter was then secured with a hemostat transected above the cuff and an Amplatz Super Stiff wire advanced through the catheter under direct fluoroscopic guidance.  A new 19 cm tip to cuff catheter was then advanced over the Amplatz wire.  It was positioned with the tip in the center of the atrium using fluoroscopic guidance.  Both lumens aspirated and flushed easily and the catheter was secured to the skin of the right chest wall with 0 silk suture.  A 4-0 Monocryl pursestring suture was placed at the exit site.  Sterile dressings were applied.  The lumens of the catheter were packed with 5000 units heparin per lumen.  COMPLICATIONS: None  CONDITION: Sharon Arias, M.D [facility] Office: [phone]  04/21/2021 3:08 PM

## 2021-04-21 NOTE — Interval H&P Note (Signed)
History and Physical Interval Note:  04/21/2021 2:01 PM  Sharon Arias  has presented today for surgery, with the diagnosis of LT Arm Fistulagram   End Stage Renal Covid April 29.  The various methods of treatment have been discussed with the patient and family. After consideration of risks, benefits and other options for treatment, the patient has consented to  Procedure(s): A/V FISTULAGRAM (Left) as a surgical intervention.  The patient's history has been reviewed, patient examined, no change in status, stable for surgery.  I have reviewed the patient's chart and labs.  Questions were answered to the patient's satisfaction.     [name]

## 2021-04-22 ENCOUNTER — Encounter: Payer: Self-pay | Admitting: Vascular Surgery

## 2021-05-13 ENCOUNTER — Other Ambulatory Visit (INDEPENDENT_AMBULATORY_CARE_PROVIDER_SITE_OTHER): Payer: Self-pay | Admitting: Vascular Surgery

## 2021-05-13 DIAGNOSIS — N186 End stage renal disease: Secondary | ICD-10-CM

## 2021-05-13 DIAGNOSIS — Z95820 Peripheral vascular angioplasty status with implants and grafts: Secondary | ICD-10-CM

## 2021-05-13 DIAGNOSIS — Z9862 Peripheral vascular angioplasty status: Secondary | ICD-10-CM

## 2021-05-14 ENCOUNTER — Other Ambulatory Visit: Payer: Self-pay

## 2021-05-14 ENCOUNTER — Telehealth (INDEPENDENT_AMBULATORY_CARE_PROVIDER_SITE_OTHER): Payer: Self-pay

## 2021-05-14 ENCOUNTER — Ambulatory Visit (INDEPENDENT_AMBULATORY_CARE_PROVIDER_SITE_OTHER): Payer: Medicare Other

## 2021-05-14 ENCOUNTER — Ambulatory Visit (INDEPENDENT_AMBULATORY_CARE_PROVIDER_SITE_OTHER): Payer: Medicare Other | Admitting: Nurse Practitioner

## 2021-05-14 ENCOUNTER — Encounter (INDEPENDENT_AMBULATORY_CARE_PROVIDER_SITE_OTHER): Payer: Self-pay | Admitting: Nurse Practitioner

## 2021-05-14 VITALS — BP 147/61 | HR 57 | Ht 68.0 in | Wt 263.0 lb

## 2021-05-14 DIAGNOSIS — Z9862 Peripheral vascular angioplasty status: Secondary | ICD-10-CM

## 2021-05-14 DIAGNOSIS — I10 Essential (primary) hypertension: Secondary | ICD-10-CM | POA: Diagnosis not present

## 2021-05-14 DIAGNOSIS — N186 End stage renal disease: Secondary | ICD-10-CM | POA: Diagnosis not present

## 2021-05-14 DIAGNOSIS — E1121 Type 2 diabetes mellitus with diabetic nephropathy: Secondary | ICD-10-CM

## 2021-05-14 NOTE — Telephone Encounter (Signed)
A fax was received from [place] at [facility] for the patient to have a permcath removal. Patient scheduled on 06/02/022 with a 11:00 am arrival time to the MM. Pre-procedure instructions will be faxed to [facility] at [facility] per request.

## 2021-05-18 ENCOUNTER — Encounter (INDEPENDENT_AMBULATORY_CARE_PROVIDER_SITE_OTHER): Payer: Self-pay | Admitting: Nurse Practitioner

## 2021-05-18 NOTE — Progress Notes (Signed)
Subjective:    Patient ID: Sharon Arias, female    DOB: 1961-07-28, 60 y.o.   MRN: [id number] Chief Complaint  Patient presents with  . Follow-up    2 week post [facility] - HDA     The patient returns to the office for followup status post intervention of the dialysis access left brachiocephalic AV fistula. Following the intervention the access has seemed to mature more and allow dialysis to utilize the access.  However, they still continue to have difficulty with cannulation.  The patient has had a recent infiltration and it has left her very bruised and area very swollen.  A thrill and bruit can still be felt but in certain areas it does become difficult.  She has been told by her dialysis center that her access may be too deep.  She also endorses having some coldness in her left hand but does not know if it is worse with dialysis.  The patient denies amaurosis fugax or recent TIA symptoms. There are no recent neurological changes noted. The patient denies claudication symptoms or rest pain symptoms. The patient denies history of DVT, PE or superficial thrombophlebitis. The patient denies recent episodes of angina or shortness of breath.     Today the patient has a flow volume of 943.  There are no areas of significant stenosis noted.   Review of Systems  Skin: Positive for color change.  Hematological: Bruises/bleeds easily.  All other systems reviewed and are negative.      Objective:   Physical Exam Vitals reviewed.  HENT:     Head: Normocephalic.  Cardiovascular:     Rate and Rhythm: Normal rate.     Pulses: Normal pulses.     Arteriovenous access: left arteriovenous access is present.    Comments: Good thrill and bruit but swelling and hematoma over area  Pulmonary:     Effort: Pulmonary effort is normal.  Neurological:     Mental Status: She is alert and oriented to person, place, and time.  Psychiatric:        Mood and Affect: Mood normal.        Behavior: Behavior  normal.        Thought Content: Thought content normal.        Judgment: Judgment normal.     BP (!) 147/61   Pulse (!) 57   Ht 5\' 8"  (1.727 m)   Wt 263 lb (119.3 kg)   BMI 39.99 kg/m   Past Medical History:  Diagnosis Date  . Anemia in chronic kidney disease 05/19/2016  . Anxiety   . Aortic atherosclerosis ([place])   . Blind   . CAD (coronary artery disease)   . Cardiac murmur   . CHF (congestive heart failure) ([place])    G2DD on 02/2020 TTE  . Chicken pox   . Chronic anticoagulation   . CVA (cerebral vascular accident) ([place])   . DDD (degenerative disc disease), lumbar   . Detached retina   . Diabetic neuropathy ([place])   . Diabetic retinopathy ([place])    legally blind  . Dyspnea   . ESRD needing dialysis ([place])   . Hx of CABG   . Hyperlipidemia   . Hypertension   . NSTEMI (non-ST elevated myocardial infarction) ([place]) 06/2012  . Obesity   . Pneumonia   . PONV (postoperative nausea and vomiting)   . Sciatica of right side   . Sleep apnea    CPAP NIGHTLY  . T2DM (type 2 diabetes  mellitus) ([place])     Social History   Socioeconomic History  . Marital status: Married    Spouse name: Not on file  . Number of children: Not on file  . Years of education: Not on file  . Highest education level: Not on file  Occupational History  . Not on file  Tobacco Use  . Smoking status: Never Smoker  . Smokeless tobacco: Never Used  Vaping Use  . Vaping Use: Never used  Substance and Sexual Activity  . Alcohol use: No  . Drug use: No  . Sexual activity: Not Currently  Other Topics Concern  . Not on file  Social History Narrative  . Not on file   Social Determinants of Health   Financial Resource Strain: Not on file  Food Insecurity: Not on file  Transportation Needs: Not on file  Physical Activity: Not on file  Stress: Not on file  Social Connections: Not on file  Intimate Partner Violence: Not on file    Past Surgical History:  Procedure Laterality Date  . A/V  FISTULAGRAM Left 04/21/2021   Procedure: A/V FISTULAGRAM;  Surgeon: Katha Cabal, MD;  Location: [facility];  Service: Cardiovascular;  Laterality: Left;  . AV FISTULA PLACEMENT Left 02/06/2021   Procedure: ARTERIOVENOUS (AV) FISTULA CREATION (BRACHIAL CEPHALIC );  Surgeon: Katha Cabal, MD;  Location: [facility] ORS;  Service: Vascular;  Laterality: Left;  . COLONOSCOPY WITH PROPOFOL N/A 01/26/2016   Procedure: COLONOSCOPY WITH PROPOFOL;  Surgeon: Lollie Sails, MD;  Location: [facility] ENDOSCOPY;  Service: Endoscopy;  Laterality: N/A;  . COLONOSCOPY WITH PROPOFOL N/A 01/27/2016   Procedure: COLONOSCOPY WITH PROPOFOL;  Surgeon: Lollie Sails, MD;  Location: [facility] ENDOSCOPY;  Service: Endoscopy;  Laterality: N/A;  . CORONARY ARTERY BYPASS GRAFT  2013  . DIALYSIS/PERMA CATHETER INSERTION N/A 02/03/2021   Procedure: DIALYSIS/PERMA CATHETER INSERTION;  Surgeon: Katha Cabal, MD;  Location: [facility];  Service: Cardiovascular;  Laterality: N/A;  . INCISION AND DRAINAGE     chest abscess  . INCISION AND DRAINAGE ABSCESS N/A 12/11/2018   Procedure: INCISION AND DRAINAGE PERINEAL;  Surgeon: Jules Husbands, MD;  Location: [facility] ORS;  Service: General;  Laterality: N/A;  . RETINAL LASER PROCEDURE      Family History  Problem Relation Age of Onset  . Breast cancer Mother   . Prostate cancer Father        we think mets to liver and bone    Allergies  Allergen Reactions  . Ozempic (0.25 Or 0.5 Mg-Dose) [Semaglutide(0.25 Or 0.5mg -Dos)] Diarrhea and Nausea Only  . Trulicity [Dulaglutide] Diarrhea and Nausea Only    CBC Latest Ref Rng & Units 02/06/2021 02/04/2021 12/26/2020  WBC 4.0 - 10.5 K/uL - 10.9(H) 10.0  Hemoglobin 12.0 - 15.0 g/dL 9.2(L) 9.0(L) 9.5(L)  Hematocrit 36.0 - 46.0 % 27.0(L) 27.5(L) 28.3(L)  Platelets 150 - 400 K/uL - 239 234      CMP     Component Value Date/Time   NA 143 02/06/2021 0652   K 4.1 02/06/2021 0652   CL 114 (H) 02/06/2021 0652   CO2  18 (L) 02/04/2021 1217   GLUCOSE 207 (H) 02/06/2021 0652   BUN 75 (H) 02/06/2021 0652   CREATININE 4.40 (H) 02/06/2021 0652   CALCIUM 9.3 02/04/2021 1217   PROT 7.2 05/24/2020 0520   ALBUMIN 3.3 (L) 05/24/2020 0520   AST 17 05/24/2020 0520   ALT 29 05/24/2020 0520   ALKPHOS 68 05/24/2020 0520  BILITOT 0.6 05/24/2020 0520   GFRNONAA 14 (L) 02/04/2021 1217   GFRAA 19 (L) 09/12/2020 1331     No results found.     Assessment & Plan:   1. ESRD (end stage renal disease) ([place]) The patient does have some significant bruising which is likely causing difficulty with her cannulation.  Although the patient have a palpable radial pulse the cold hand is somewhat concerning.  Prior to planning any sort of superficialization surgery, we will allow the arm time for the swelling to decrease.  We will have the patient return in several weeks to repeat HDA as well as to do a still study to evaluate for any still symptoms that may need to be repaired prior to any superficialization as well.  The patient should allow time for access to rest and utilize her PermCath while the swelling is subsiding.  2. Primary hypertension Continue antihypertensive medications as already ordered, these medications have been reviewed and there are no changes at this time.   3. Type 2 diabetes mellitus with diabetic nephropathy, without long-term current use of insulin (HCC) Continue hypoglycemic medications as already ordered, these medications have been reviewed and there are no changes at this time.  Hgb A1C to be monitored as already arranged by primary service    Current Outpatient Medications on File Prior to Visit  Medication Sig Dispense Refill  . allopurinol (ZYLOPRIM) 300 MG tablet Take 300 mg by mouth daily.    [name] amLODipine (NORVASC) 10 MG tablet Take 10 mg by mouth at bedtime.    [name] aspirin EC 81 MG tablet Take 81 mg by mouth every morning.    . clopidogrel (PLAVIX) 75 MG tablet Take 1 tablet (75 mg total)  by mouth daily. 30 tablet 0  . hydrALAZINE (APRESOLINE) 25 MG tablet Take 1 tablet (25 mg total) by mouth 2 (two) times daily at 10 AM and 5 PM. (Patient taking differently: Take 50 mg by mouth 2 (two) times daily at 10 AM and 5 PM.) 60 tablet 0  . Insulin Degludec (TRESIBA) 100 UNIT/ML SOLN Inject 80 mg into the skin at bedtime.    . insulin glargine (LANTUS SOLOSTAR) 100 UNIT/ML Solostar Pen Inject 57 Units into the skin at bedtime. 15 mL 11  . insulin lispro (HUMALOG) 100 UNIT/ML KwikPen Inject 15-40 Units into the skin See admin instructions. Pt. Uses 15 units/ml in the mornings, 24 units/ml in the lunch, and 40 units/ml atDinner    . lidocaine-prilocaine (EMLA) cream Apply topically daily.    [name] losartan (COZAAR) 100 MG tablet Take 100 mg by mouth daily.    . metoprolol tartrate (LOPRESSOR) 50 MG tablet Take 50 mg by mouth 2 (two) times daily.     . multivitamin (RENA-VIT) TABS tablet Take 1 tablet by mouth at bedtime.    Glory Rosebush VERIO test strip     . rosuvastatin (CRESTOR) 20 MG tablet Take 20 mg by mouth at bedtime.     . furosemide (LASIX) 20 MG tablet Take 1 tablet (20 mg total) by mouth every other day. (Patient not taking: Reported on 04/17/2021) 30 tablet 0  . isosorbide mononitrate (IMDUR) 30 MG 24 hr tablet Take 1 tablet (30 mg total) by mouth daily. (Patient not taking: Reported on 04/17/2021) 30 tablet 0   No current facility-administered medications on file prior to visit.    There are no Patient Instructions on file for this visit. No follow-ups on file.   Kris Hartmann, NP

## 2021-05-28 ENCOUNTER — Ambulatory Visit: Admit: 2021-05-28 | Payer: Medicare Other | Admitting: Vascular Surgery

## 2021-05-28 DIAGNOSIS — N186 End stage renal disease: Secondary | ICD-10-CM

## 2021-05-28 SURGERY — DIALYSIS/PERMA CATHETER REMOVAL
Anesthesia: LOCAL

## 2021-06-03 DIAGNOSIS — Z4902 Encounter for fitting and adjustment of peritoneal dialysis catheter: Secondary | ICD-10-CM | POA: Insufficient documentation

## 2021-06-07 ENCOUNTER — Other Ambulatory Visit (INDEPENDENT_AMBULATORY_CARE_PROVIDER_SITE_OTHER): Payer: Self-pay | Admitting: Nurse Practitioner

## 2021-06-07 DIAGNOSIS — N186 End stage renal disease: Secondary | ICD-10-CM

## 2021-06-11 ENCOUNTER — Ambulatory Visit (INDEPENDENT_AMBULATORY_CARE_PROVIDER_SITE_OTHER): Payer: Medicare Other

## 2021-06-11 ENCOUNTER — Other Ambulatory Visit: Payer: Self-pay

## 2021-06-11 ENCOUNTER — Ambulatory Visit (INDEPENDENT_AMBULATORY_CARE_PROVIDER_SITE_OTHER): Payer: Medicare Other | Admitting: Vascular Surgery

## 2021-06-11 VITALS — BP 118/63 | HR 67 | Ht 67.0 in | Wt 265.0 lb

## 2021-06-11 DIAGNOSIS — N186 End stage renal disease: Secondary | ICD-10-CM

## 2021-06-11 DIAGNOSIS — I10 Essential (primary) hypertension: Secondary | ICD-10-CM

## 2021-06-11 DIAGNOSIS — E1121 Type 2 diabetes mellitus with diabetic nephropathy: Secondary | ICD-10-CM

## 2021-06-11 DIAGNOSIS — T829XXS Unspecified complication of cardiac and vascular prosthetic device, implant and graft, sequela: Secondary | ICD-10-CM

## 2021-06-11 DIAGNOSIS — I2510 Atherosclerotic heart disease of native coronary artery without angina pectoris: Secondary | ICD-10-CM | POA: Diagnosis not present

## 2021-06-11 NOTE — Progress Notes (Signed)
MRN : [id number]  Sharon Arias is a 60 y.o. (Feb 19, 1961) female who presents with chief complaint of  Chief Complaint  Patient presents with   Follow-up    4wk U/S  .  History of Present Illness:    The patient is seen for evaluation of dialysis access.  The patient has a history of multiple failed accesses.     Current access is via a catheter which is functioning poorly.    The patient denies amaurosis fugax or recent TIA symptoms. There are no recent neurological changes noted. The patient denies claudication symptoms or rest pain symptoms. The patient denies history of DVT, PE or superficial thrombophlebitis. The patient denies recent episodes of angina or shortness of breath.    Current Meds  Medication Sig   allopurinol (ZYLOPRIM) 300 MG tablet Take 300 mg by mouth daily.   allopurinol (ZYLOPRIM) 300 MG tablet Take 1 tablet by mouth daily.   amLODipine (NORVASC) 10 MG tablet Take 10 mg by mouth at bedtime.   amLODipine (NORVASC) 10 MG tablet Take 1 tablet by mouth at bedtime.   aspirin EC 81 MG tablet Take 81 mg by mouth every morning.   clopidogrel (PLAVIX) 75 MG tablet Take 1 tablet (75 mg total) by mouth daily.   clopidogrel (PLAVIX) 75 MG tablet Take 1 tablet by mouth daily.   furosemide (LASIX) 20 MG tablet Take 1 tablet (20 mg total) by mouth every other day.   hydrALAZINE (APRESOLINE) 25 MG tablet Take 1 tablet (25 mg total) by mouth 2 (two) times daily at 10 AM and 5 PM. (Patient taking differently: Take 50 mg by mouth 2 (two) times daily at 10 AM and 5 PM.)   hydrALAZINE (APRESOLINE) 50 MG tablet Take by mouth.   Insulin Degludec (TRESIBA) 100 UNIT/ML SOLN Inject 80 mg into the skin at bedtime.   insulin glargine (LANTUS SOLOSTAR) 100 UNIT/ML Solostar Pen Inject 57 Units into the skin at bedtime.   insulin lispro (HUMALOG) 100 UNIT/ML KwikPen Inject 15-40 Units into the skin See admin instructions. Pt. Uses 15 units/ml in the mornings, 24 units/ml in the lunch,  and 40 units/ml atDinner   lidocaine-prilocaine (EMLA) cream Apply topically daily.   losartan (COZAAR) 100 MG tablet Take 100 mg by mouth daily.   metoprolol tartrate (LOPRESSOR) 50 MG tablet Take 50 mg by mouth 2 (two) times daily.    multivitamin (RENA-VIT) TABS tablet Take 1 tablet by mouth at bedtime.   ONETOUCH VERIO test strip    rosuvastatin (CRESTOR) 20 MG tablet Take 20 mg by mouth at bedtime.    spironolactone (ALDACTONE) 25 MG tablet Take 1 tablet by mouth once a week.    Past Medical History:  Diagnosis Date   Anemia in chronic kidney disease 05/19/2016   Anxiety    Aortic atherosclerosis (HCC)    Blind    CAD (coronary artery disease)    Cardiac murmur    CHF (congestive heart failure) ([place])    G2DD on 02/2020 TTE   Chicken pox    Chronic anticoagulation    CVA (cerebral vascular accident) ([place])    DDD (degenerative disc disease), lumbar    Detached retina    Diabetic neuropathy ([place])    Diabetic retinopathy ([place])    legally blind   Dyspnea    ESRD needing dialysis ([place])    Hx of CABG    Hyperlipidemia    Hypertension    NSTEMI (non-ST elevated myocardial infarction) ([place]) 06/2012  Obesity    Pneumonia    PONV (postoperative nausea and vomiting)    Sciatica of right side    Sleep apnea    CPAP NIGHTLY   T2DM (type 2 diabetes mellitus) [facility])     Past Surgical History:  Procedure Laterality Date   A/V FISTULAGRAM Left 04/21/2021   Procedure: A/V FISTULAGRAM;  Surgeon: Katha Cabal, MD;  Location: [facility];  Service: Cardiovascular;  Laterality: Left;   AV FISTULA PLACEMENT Left 02/06/2021   Procedure: ARTERIOVENOUS (AV) FISTULA CREATION (BRACHIAL CEPHALIC );  Surgeon: Katha Cabal, MD;  Location: [facility] ORS;  Service: Vascular;  Laterality: Left;   COLONOSCOPY WITH PROPOFOL N/A 01/26/2016   Procedure: COLONOSCOPY WITH PROPOFOL;  Surgeon: Lollie Sails, MD;  Location: [facility] ENDOSCOPY;  Service: Endoscopy;  Laterality: N/A;    COLONOSCOPY WITH PROPOFOL N/A 01/27/2016   Procedure: COLONOSCOPY WITH PROPOFOL;  Surgeon: Lollie Sails, MD;  Location: [facility] ENDOSCOPY;  Service: Endoscopy;  Laterality: N/A;   CORONARY ARTERY BYPASS GRAFT  2013   DIALYSIS/PERMA CATHETER INSERTION N/A 02/03/2021   Procedure: DIALYSIS/PERMA CATHETER INSERTION;  Surgeon: Katha Cabal, MD;  Location: [facility] LAB;  Service: Cardiovascular;  Laterality: N/A;   INCISION AND DRAINAGE     chest abscess   INCISION AND DRAINAGE ABSCESS N/A 12/11/2018   Procedure: INCISION AND DRAINAGE PERINEAL;  Surgeon: Jules Husbands, MD;  Location: [facility] ORS;  Service: General;  Laterality: N/A;   RETINAL LASER PROCEDURE      Social History Social History   Tobacco Use   Smoking status: Never   Smokeless tobacco: Never  Vaping Use   Vaping Use: Never used  Substance Use Topics   Alcohol use: No   Drug use: No    Family History Family History  Problem Relation Age of Onset   Breast cancer Mother    Prostate cancer Father        we think mets to liver and bone    Allergies  Allergen Reactions   Ozempic (0.25 Or 0.5 Mg-Dose) [Semaglutide(0.25 Or 0.5mg -Dos)] Diarrhea and Nausea Only   Trulicity [Dulaglutide] Diarrhea and Nausea Only     REVIEW OF SYSTEMS (Negative unless checked)  Constitutional: [] Weight loss  [] Fever  [] Chills Cardiac: [] Chest pain   [] Chest pressure   [] Palpitations   [] Shortness of breath when laying flat   [] Shortness of breath with exertion. Vascular:  [] Pain in legs with walking   [] Pain in legs at rest  [] History of DVT   [] Phlebitis   [] Swelling in legs   [] Varicose veins   [] Non-healing ulcers Pulmonary:   [] Uses home oxygen   [] Productive cough   [] Hemoptysis   [] Wheeze  [] COPD   [] Asthma Neurologic:  [] Dizziness   [] Seizures   [] History of stroke   [] History of TIA  [] Aphasia   [] Vissual changes   [] Weakness or numbness in arm   [] Weakness or numbness in leg Musculoskeletal:   [] Joint swelling    [x] Joint pain   [] Low back pain Hematologic:  [] Easy bruising  [] Easy bleeding   [] Hypercoagulable state   [] Anemic Gastrointestinal:  [] Diarrhea   [] Vomiting  [x] Gastroesophageal reflux/heartburn   [] Difficulty swallowing. Genitourinary:  [x] Chronic kidney disease   [] Difficult urination  [] Frequent urination   [] Blood in urine Skin:  [] Rashes   [] Ulcers  Psychological:  [] History of anxiety   []  History of major depression.  Physical Examination  Vitals:   06/11/21 1440  BP: 118/63  Pulse: 67  Weight: 265 lb (  120.2 kg)  Height: 5\' 7"  (1.702 m)   Body mass index is 41.5 kg/m. Gen: WD/WN, NAD Head: [place]/AT, No temporalis wasting.  Ear/Nose/Throat: Hearing grossly intact, nares w/o erythema or drainage Eyes: PER, EOMI, sclera nonicteric.  Neck: Supple, no large masses.   Pulmonary:  Good air movement, no audible wheezing bilaterally, no use of accessory muscles.  Cardiac: RRR, no JVD Vascular:   right IJ catheter which has pulled back significantly Vessel Right Left  Radial Palpable Palpable  Gastrointestinal: Non-distended. No guarding/no peritoneal signs.  Musculoskeletal: M/S 5/5 throughout.  No deformity or atrophy.  Neurologic: CN 2-12 intact. Symmetrical.  Speech is fluent. Motor exam as listed above. Psychiatric: Judgment intact, Mood & affect appropriate for pt's clinical situation. Dermatologic: No rashes or ulcers noted.  No changes consistent with cellulitis. Lymph : No lichenification or skin changes of chronic lymphedema.  CBC Lab Results  Component Value Date   WBC 10.9 (H) 02/04/2021   HGB 9.2 (L) 02/06/2021   HCT 27.0 (L) 02/06/2021   MCV 92.3 02/04/2021   PLT 239 02/04/2021    BMET    Component Value Date/Time   NA 143 02/06/2021 0652   K 4.1 02/06/2021 0652   CL 114 (H) 02/06/2021 0652   CO2 18 (L) 02/04/2021 1217   GLUCOSE 207 (H) 02/06/2021 0652   BUN 75 (H) 02/06/2021 0652   CREATININE 4.40 (H) 02/06/2021 0652   CALCIUM 9.3 02/04/2021 1217    GFRNONAA 14 (L) 02/04/2021 1217   GFRAA 19 (L) 09/12/2020 1331   CrCl cannot be calculated (Patient's most recent lab result is older than the maximum 21 days allowed.).  COAG Lab Results  Component Value Date   INR 1.1 02/04/2021   INR 1.2 03/18/2020    Radiology VAS [place] (AVF, AVG)  Result Date: 05/25/2021 DIALYSIS ACCESS Patient Name:  DOSIA YODICE  Date of Exam:   05/14/2021 Medical Rec #: [id number]       Accession #:    [id number] Date of Birth: 11-Feb-1961        Patient Gender: F Patient Age:   60Y Exam Location:  [facility] Procedure:      VAS US DUPLEX DIALYSIS ACCESS (AVF, AVG) Referring Phys: [id number] [place] --------------------------------------------------------------------------------  Reason for Exam: Routine follow up. Access Site: Left Upper Extremity. Access Type: Brachial-cephalic AVF. History: 04/21/2021 PTA left Brachial/Caphalic fistula. Exchange right IJ          catheter. Comparison Study: 03/02/2021 Performing Technologist: Charlane Ferretti RT (R)(VS)  Examination Guidelines: A complete evaluation includes B-mode imaging, spectral Doppler, color Doppler, and power Doppler as needed of all accessible portions of each vessel. Unilateral testing is considered an integral part of a complete examination. Limited examinations for reoccurring indications may be performed as noted.  Findings: +--------------------+----------+-----------------+--------+ AVF                 PSV (cm/s)Flow Vol (mL/min)Comments +--------------------+----------+-----------------+--------+ Native artery inflow   325           943                +--------------------+----------+-----------------+--------+ AVF Anastomosis        482                              +--------------------+----------+-----------------+--------+  +---------------+----------+-------------+----------+--------+ OUTFLOW VEIN   PSV (cm/s)Diameter (cm)Depth (cm)Describe  +---------------+----------+-------------+----------+--------+ Subclavian vein   198                                    +---------------+----------+-------------+----------+--------+  Axillary vein     230                                    +---------------+----------+-------------+----------+--------+ Confluence        226                                    +---------------+----------+-------------+----------+--------+ Prox UA           173                                    +---------------+----------+-------------+----------+--------+ Mid UA            224                                    +---------------+----------+-------------+----------+--------+ Dist UA           224                                    +---------------+----------+-------------+----------+--------+  +---------------+-------------+---------+---------+----------+-----------------+                Diameter (cm)  Depth  BranchingPSV (cm/s)   Flow Volume                                  (cm)                          (ml/min)      +---------------+-------------+---------+---------+----------+-----------------+ Left Radial                                      127                      Artery                                                                    +---------------+-------------+---------+---------+----------+-----------------+  Summary: Patent arteriovenous fistula. *See table(s) above for measurements and observations.  Diagnosing physician: [name] MD Electronically signed by [name] MD on 05/25/2021 at 2:53:37 PM.   --------------------------------------------------------------------------------   Final      Assessment/Plan 1. Complication from renal dialysis device, sequela Recommend:  At this time the patient does not have appropriate extremity access for dialysis  Patient should have her catheter exchanged  The risks, benefits and alternative  therapies were reviewed in detail with the patient.  All questions were answered.  The patient agrees to proceed with surgery.    2. End stage renal disease ([place]) At the present time the patient has adequate dialysis access.  Continue hemodialysis as ordered without interruption.  Avoid nephrotoxic medications and dehydration.  Further plans per nephrology   3. Coronary artery  disease involving native coronary artery of native heart without angina pectoris Continue cardiac and antihypertensive medications as already ordered and reviewed, no changes at this time.  Continue statin as ordered and reviewed, no changes at this time  Nitrates PRN for chest pain   4. Primary hypertension Continue antihypertensive medications as already ordered, these medications have been reviewed and there are no changes at this time.   5. Type 2 diabetes mellitus with diabetic nephropathy, without long-term current use of insulin ([name]) Continue hypoglycemic medications as already ordered, these medications have been reviewed and there are no changes at this time.  Hgb A1C to be monitored as already arranged by primary service    [name], MD  06/11/2021 2:54 PM

## 2021-06-11 NOTE — H&P (View-Only) (Signed)
MRN : [id number]  Sharon Arias is a 60 y.o. (01-10-1961) female who presents with chief complaint of  Chief Complaint  Patient presents with   Follow-up    4wk U/S  .  History of Present Illness:    The patient is seen for evaluation of dialysis access.  The patient has a history of multiple failed accesses.     Current access is via a catheter which is functioning poorly.    The patient denies amaurosis fugax or recent TIA symptoms. There are no recent neurological changes noted. The patient denies claudication symptoms or rest pain symptoms. The patient denies history of DVT, PE or superficial thrombophlebitis. The patient denies recent episodes of angina or shortness of breath.    Current Meds  Medication Sig   allopurinol (ZYLOPRIM) 300 MG tablet Take 300 mg by mouth daily.   allopurinol (ZYLOPRIM) 300 MG tablet Take 1 tablet by mouth daily.   amLODipine (NORVASC) 10 MG tablet Take 10 mg by mouth at bedtime.   amLODipine (NORVASC) 10 MG tablet Take 1 tablet by mouth at bedtime.   aspirin EC 81 MG tablet Take 81 mg by mouth every morning.   clopidogrel (PLAVIX) 75 MG tablet Take 1 tablet (75 mg total) by mouth daily.   clopidogrel (PLAVIX) 75 MG tablet Take 1 tablet by mouth daily.   furosemide (LASIX) 20 MG tablet Take 1 tablet (20 mg total) by mouth every other day.   hydrALAZINE (APRESOLINE) 25 MG tablet Take 1 tablet (25 mg total) by mouth 2 (two) times daily at 10 AM and 5 PM. (Patient taking differently: Take 50 mg by mouth 2 (two) times daily at 10 AM and 5 PM.)   hydrALAZINE (APRESOLINE) 50 MG tablet Take by mouth.   Insulin Degludec (TRESIBA) 100 UNIT/ML SOLN Inject 80 mg into the skin at bedtime.   insulin glargine (LANTUS SOLOSTAR) 100 UNIT/ML Solostar Pen Inject 57 Units into the skin at bedtime.   insulin lispro (HUMALOG) 100 UNIT/ML KwikPen Inject 15-40 Units into the skin See admin instructions. Pt. Uses 15 units/ml in the mornings, 24 units/ml in the lunch,  and 40 units/ml atDinner   lidocaine-prilocaine (EMLA) cream Apply topically daily.   losartan (COZAAR) 100 MG tablet Take 100 mg by mouth daily.   metoprolol tartrate (LOPRESSOR) 50 MG tablet Take 50 mg by mouth 2 (two) times daily.    multivitamin (RENA-VIT) TABS tablet Take 1 tablet by mouth at bedtime.   ONETOUCH VERIO test strip    rosuvastatin (CRESTOR) 20 MG tablet Take 20 mg by mouth at bedtime.    spironolactone (ALDACTONE) 25 MG tablet Take 1 tablet by mouth once a week.    Past Medical History:  Diagnosis Date   Anemia in chronic kidney disease 05/19/2016   Anxiety    Aortic atherosclerosis (HCC)    Blind    CAD (coronary artery disease)    Cardiac murmur    CHF (congestive heart failure) ([place])    G2DD on 02/2020 TTE   Chicken pox    Chronic anticoagulation    CVA (cerebral vascular accident) ([place])    DDD (degenerative disc disease), lumbar    Detached retina    Diabetic neuropathy ([place])    Diabetic retinopathy ([place])    legally blind   Dyspnea    ESRD needing dialysis ([place])    Hx of CABG    Hyperlipidemia    Hypertension    NSTEMI (non-ST elevated myocardial infarction) ([place]) 06/2012  Obesity    Pneumonia    PONV (postoperative nausea and vomiting)    Sciatica of right side    Sleep apnea    CPAP NIGHTLY   T2DM (type 2 diabetes mellitus) [facility])     Past Surgical History:  Procedure Laterality Date   A/V FISTULAGRAM Left 04/21/2021   Procedure: A/V FISTULAGRAM;  Surgeon: Katha Cabal, MD;  Location: [facility];  Service: Cardiovascular;  Laterality: Left;   AV FISTULA PLACEMENT Left 02/06/2021   Procedure: ARTERIOVENOUS (AV) FISTULA CREATION (BRACHIAL CEPHALIC );  Surgeon: Katha Cabal, MD;  Location: [facility] ORS;  Service: Vascular;  Laterality: Left;   COLONOSCOPY WITH PROPOFOL N/A 01/26/2016   Procedure: COLONOSCOPY WITH PROPOFOL;  Surgeon: Lollie Sails, MD;  Location: [facility] ENDOSCOPY;  Service: Endoscopy;  Laterality: N/A;    COLONOSCOPY WITH PROPOFOL N/A 01/27/2016   Procedure: COLONOSCOPY WITH PROPOFOL;  Surgeon: Lollie Sails, MD;  Location: [facility] ENDOSCOPY;  Service: Endoscopy;  Laterality: N/A;   CORONARY ARTERY BYPASS GRAFT  2013   DIALYSIS/PERMA CATHETER INSERTION N/A 02/03/2021   Procedure: DIALYSIS/PERMA CATHETER INSERTION;  Surgeon: Katha Cabal, MD;  Location: [facility];  Service: Cardiovascular;  Laterality: N/A;   INCISION AND DRAINAGE     chest abscess   INCISION AND DRAINAGE ABSCESS N/A 12/11/2018   Procedure: INCISION AND DRAINAGE PERINEAL;  Surgeon: Jules Husbands, MD;  Location: [facility] ORS;  Service: General;  Laterality: N/A;   RETINAL LASER PROCEDURE      Social History Social History   Tobacco Use   Smoking status: Never   Smokeless tobacco: Never  Vaping Use   Vaping Use: Never used  Substance Use Topics   Alcohol use: No   Drug use: No    Family History Family History  Problem Relation Age of Onset   Breast cancer Mother    Prostate cancer Father        we think mets to liver and bone    Allergies  Allergen Reactions   Ozempic (0.25 Or 0.5 Mg-Dose) [Semaglutide(0.25 Or 0.5mg -Dos)] Diarrhea and Nausea Only   Trulicity [Dulaglutide] Diarrhea and Nausea Only     REVIEW OF SYSTEMS (Negative unless checked)  Constitutional: [] Weight loss  [] Fever  [] Chills Cardiac: [] Chest pain   [] Chest pressure   [] Palpitations   [] Shortness of breath when laying flat   [] Shortness of breath with exertion. Vascular:  [] Pain in legs with walking   [] Pain in legs at rest  [] History of DVT   [] Phlebitis   [] Swelling in legs   [] Varicose veins   [] Non-healing ulcers Pulmonary:   [] Uses home oxygen   [] Productive cough   [] Hemoptysis   [] Wheeze  [] COPD   [] Asthma Neurologic:  [] Dizziness   [] Seizures   [] History of stroke   [] History of TIA  [] Aphasia   [] Vissual changes   [] Weakness or numbness in arm   [] Weakness or numbness in leg Musculoskeletal:   [] Joint swelling    [x] Joint pain   [] Low back pain Hematologic:  [] Easy bruising  [] Easy bleeding   [] Hypercoagulable state   [] Anemic Gastrointestinal:  [] Diarrhea   [] Vomiting  [x] Gastroesophageal reflux/heartburn   [] Difficulty swallowing. Genitourinary:  [x] Chronic kidney disease   [] Difficult urination  [] Frequent urination   [] Blood in urine Skin:  [] Rashes   [] Ulcers  Psychological:  [] History of anxiety   []  History of major depression.  Physical Examination  Vitals:   06/11/21 1440  BP: 118/63  Pulse: 67  Weight: 265 lb (  120.2 kg)  Height: 5\' 7"  (1.702 m)   Body mass index is 41.5 kg/m. Gen: WD/WN, NAD Head: [place]/AT, No temporalis wasting.  Ear/Nose/Throat: Hearing grossly intact, nares w/o erythema or drainage Eyes: PER, EOMI, sclera nonicteric.  Neck: Supple, no large masses.   Pulmonary:  Good air movement, no audible wheezing bilaterally, no use of accessory muscles.  Cardiac: RRR, no JVD Vascular:   right IJ catheter which has pulled back significantly Vessel Right Left  Radial Palpable Palpable  Gastrointestinal: Non-distended. No guarding/no peritoneal signs.  Musculoskeletal: M/S 5/5 throughout.  No deformity or atrophy.  Neurologic: CN 2-12 intact. Symmetrical.  Speech is fluent. Motor exam as listed above. Psychiatric: Judgment intact, Mood & affect appropriate for pt's clinical situation. Dermatologic: No rashes or ulcers noted.  No changes consistent with cellulitis. Lymph : No lichenification or skin changes of chronic lymphedema.  CBC Lab Results  Component Value Date   WBC 10.9 (H) 02/04/2021   HGB 9.2 (L) 02/06/2021   HCT 27.0 (L) 02/06/2021   MCV 92.3 02/04/2021   PLT 239 02/04/2021    BMET    Component Value Date/Time   NA 143 02/06/2021 0652   K 4.1 02/06/2021 0652   CL 114 (H) 02/06/2021 0652   CO2 18 (L) 02/04/2021 1217   GLUCOSE 207 (H) 02/06/2021 0652   BUN 75 (H) 02/06/2021 0652   CREATININE 4.40 (H) 02/06/2021 0652   CALCIUM 9.3 02/04/2021 1217    GFRNONAA 14 (L) 02/04/2021 1217   GFRAA 19 (L) 09/12/2020 1331   CrCl cannot be calculated (Patient's most recent lab result is older than the maximum 21 days allowed.).  COAG Lab Results  Component Value Date   INR 1.1 02/04/2021   INR 1.2 03/18/2020    Radiology VAS [place] (AVF, AVG)  Result Date: 05/25/2021 DIALYSIS ACCESS Patient Name:  ANNELISE MCCOY  Date of Exam:   05/14/2021 Medical Rec #: [id number]       Accession #:    [id number] Date of Birth: 08-17-1961        Patient Gender: F Patient Age:   48Y Exam Location:  [facility] Procedure:      VAS US DUPLEX DIALYSIS ACCESS (AVF, AVG) Referring Phys: [id number] [place] --------------------------------------------------------------------------------  Reason for Exam: Routine follow up. Access Site: Left Upper Extremity. Access Type: Brachial-cephalic AVF. History: 04/21/2021 PTA left Brachial/Caphalic fistula. Exchange right IJ          catheter. Comparison Study: 03/02/2021 Performing Technologist: Charlane Ferretti RT (R)(VS)  Examination Guidelines: A complete evaluation includes B-mode imaging, spectral Doppler, color Doppler, and power Doppler as needed of all accessible portions of each vessel. Unilateral testing is considered an integral part of a complete examination. Limited examinations for reoccurring indications may be performed as noted.  Findings: +--------------------+----------+-----------------+--------+ AVF                 PSV (cm/s)Flow Vol (mL/min)Comments +--------------------+----------+-----------------+--------+ Native artery inflow   325           943                +--------------------+----------+-----------------+--------+ AVF Anastomosis        482                              +--------------------+----------+-----------------+--------+  +---------------+----------+-------------+----------+--------+ OUTFLOW VEIN   PSV (cm/s)Diameter (cm)Depth (cm)Describe  +---------------+----------+-------------+----------+--------+ Subclavian vein   198                                    +---------------+----------+-------------+----------+--------+  Axillary vein     230                                    +---------------+----------+-------------+----------+--------+ Confluence        226                                    +---------------+----------+-------------+----------+--------+ Prox UA           173                                    +---------------+----------+-------------+----------+--------+ Mid UA            224                                    +---------------+----------+-------------+----------+--------+ Dist UA           224                                    +---------------+----------+-------------+----------+--------+  +---------------+-------------+---------+---------+----------+-----------------+                Diameter (cm)  Depth  BranchingPSV (cm/s)   Flow Volume                                  (cm)                          (ml/min)      +---------------+-------------+---------+---------+----------+-----------------+ Left Radial                                      127                      Artery                                                                    +---------------+-------------+---------+---------+----------+-----------------+  Summary: Patent arteriovenous fistula. *See table(s) above for measurements and observations.  Diagnosing physician: [name] MD Electronically signed by [name] MD on 05/25/2021 at 2:53:37 PM.   --------------------------------------------------------------------------------   Final      Assessment/Plan 1. Complication from renal dialysis device, sequela Recommend:  At this time the patient does not have appropriate extremity access for dialysis  Patient should have her catheter exchanged  The risks, benefits and alternative  therapies were reviewed in detail with the patient.  All questions were answered.  The patient agrees to proceed with surgery.    2. End stage renal disease ([place]) At the present time the patient has adequate dialysis access.  Continue hemodialysis as ordered without interruption.  Avoid nephrotoxic medications and dehydration.  Further plans per nephrology   3. Coronary artery  disease involving native coronary artery of native heart without angina pectoris Continue cardiac and antihypertensive medications as already ordered and reviewed, no changes at this time.  Continue statin as ordered and reviewed, no changes at this time  Nitrates PRN for chest pain   4. Primary hypertension Continue antihypertensive medications as already ordered, these medications have been reviewed and there are no changes at this time.   5. Type 2 diabetes mellitus with diabetic nephropathy, without long-term current use of insulin ([place]) Continue hypoglycemic medications as already ordered, these medications have been reviewed and there are no changes at this time.  Hgb A1C to be monitored as already arranged by primary service    [name], MD  06/11/2021 2:54 PM

## 2021-06-12 ENCOUNTER — Telehealth (INDEPENDENT_AMBULATORY_CARE_PROVIDER_SITE_OTHER): Payer: Self-pay

## 2021-06-12 NOTE — Telephone Encounter (Signed)
I attempted to contact the patient to schedule a permcath exchange with Dr. Delana Meyer and a message was left for a return call.

## 2021-06-12 NOTE — Telephone Encounter (Signed)
Spoke with the patient and she is scheduled with Dr. Delana Meyer for a permcath exchange on 06/24/21 with a 11:00 am arrival time to the MM. Pre-procedure instructions were discussed and will be mailed.

## 2021-06-15 ENCOUNTER — Encounter (INDEPENDENT_AMBULATORY_CARE_PROVIDER_SITE_OTHER): Payer: Self-pay | Admitting: Vascular Surgery

## 2021-06-15 NOTE — Telephone Encounter (Signed)
Spoke with Celeste at [facility] and the patient has been rescheduled to 06/16/21 with a 1:45 pm arrival time to the MM. Pre-procedure instruction were discussed with Celeste and she will call the patient with the information.

## 2021-06-16 ENCOUNTER — Other Ambulatory Visit (INDEPENDENT_AMBULATORY_CARE_PROVIDER_SITE_OTHER): Payer: Self-pay | Admitting: Nurse Practitioner

## 2021-06-16 ENCOUNTER — Encounter: Payer: Self-pay | Admitting: Vascular Surgery

## 2021-06-16 ENCOUNTER — Other Ambulatory Visit: Payer: Self-pay

## 2021-06-16 ENCOUNTER — Ambulatory Visit
Admission: RE | Admit: 2021-06-16 | Discharge: 2021-06-16 | Disposition: A | Discharge status: Home / Self Care | Payer: Medicare Other | Attending: Vascular Surgery | Admitting: Vascular Surgery

## 2021-06-16 ENCOUNTER — Encounter
Admission: RE | Disposition: A | Discharge status: Home / Self Care | Payer: Self-pay | Source: Home / Self Care | Attending: Vascular Surgery

## 2021-06-16 DIAGNOSIS — I509 Heart failure, unspecified: Secondary | ICD-10-CM | POA: Diagnosis not present

## 2021-06-16 DIAGNOSIS — Z7902 Long term (current) use of antithrombotics/antiplatelets: Secondary | ICD-10-CM | POA: Insufficient documentation

## 2021-06-16 DIAGNOSIS — Z79899 Other long term (current) drug therapy: Secondary | ICD-10-CM | POA: Diagnosis not present

## 2021-06-16 DIAGNOSIS — N186 End stage renal disease: Secondary | ICD-10-CM

## 2021-06-16 DIAGNOSIS — Z794 Long term (current) use of insulin: Secondary | ICD-10-CM | POA: Insufficient documentation

## 2021-06-16 DIAGNOSIS — Z7982 Long term (current) use of aspirin: Secondary | ICD-10-CM | POA: Insufficient documentation

## 2021-06-16 DIAGNOSIS — E1122 Type 2 diabetes mellitus with diabetic chronic kidney disease: Secondary | ICD-10-CM | POA: Insufficient documentation

## 2021-06-16 DIAGNOSIS — I2510 Atherosclerotic heart disease of native coronary artery without angina pectoris: Secondary | ICD-10-CM | POA: Diagnosis not present

## 2021-06-16 DIAGNOSIS — Z888 Allergy status to other drugs, medicaments and biological substances status: Secondary | ICD-10-CM | POA: Diagnosis not present

## 2021-06-16 DIAGNOSIS — I132 Hypertensive heart and chronic kidney disease with heart failure and with stage 5 chronic kidney disease, or end stage renal disease: Secondary | ICD-10-CM | POA: Diagnosis not present

## 2021-06-16 DIAGNOSIS — Z4901 Encounter for fitting and adjustment of extracorporeal dialysis catheter: Secondary | ICD-10-CM | POA: Diagnosis not present

## 2021-06-16 HISTORY — PX: DIALYSIS/PERMA CATHETER INSERTION: CATH118288

## 2021-06-16 LAB — POTASSIUM (ARMC VASCULAR LAB ONLY): Potassium (ARMC vascular lab): 5.4 — ABNORMAL HIGH (ref 3.5–5.1)

## 2021-06-16 LAB — GLUCOSE, CAPILLARY
Glucose-Capillary: 141 mg/dL — ABNORMAL HIGH (ref 70–99)
Glucose-Capillary: 170 mg/dL — ABNORMAL HIGH (ref 70–99)

## 2021-06-16 SURGERY — DIALYSIS/PERMA CATHETER INSERTION
Anesthesia: Moderate Sedation

## 2021-06-16 MED ORDER — CEFAZOLIN (ANCEF) 1 G IV SOLR: 1.0000 g | INTRAVENOUS | Status: DC

## 2021-06-16 MED ORDER — FENTANYL CITRATE (PF) 100 MCG/2ML IJ SOLN
INTRAMUSCULAR | Status: DC | PRN
  Administered 2021-06-16 (×2): 50 ug via INTRAVENOUS

## 2021-06-16 MED ORDER — DIPHENHYDRAMINE HCL 50 MG/ML IJ SOLN: 50.0000 mg | Freq: Once | INTRAMUSCULAR | Status: DC | PRN

## 2021-06-16 MED ORDER — HYDROMORPHONE HCL 1 MG/ML IJ SOLN: 1.0000 mg | Freq: Once | INTRAMUSCULAR | Status: DC | PRN

## 2021-06-16 MED ORDER — FENTANYL CITRATE (PF) 100 MCG/2ML IJ SOLN
INTRAMUSCULAR | Status: AC
  Filled 2021-06-16: qty 2

## 2021-06-16 MED ORDER — ONDANSETRON HCL 4 MG/2ML IJ SOLN
INTRAMUSCULAR | Status: AC
  Filled 2021-06-16: qty 2

## 2021-06-16 MED ORDER — SODIUM CHLORIDE 0.9 % IV SOLN: INTRAVENOUS | Status: DC

## 2021-06-16 MED ORDER — MIDAZOLAM HCL 2 MG/ML PO SYRP: 8.0000 mg | ORAL_SOLUTION | Freq: Once | ORAL | Status: DC | PRN

## 2021-06-16 MED ORDER — METHYLPREDNISOLONE SODIUM SUCC 125 MG IJ SOLR: 125.0000 mg | Freq: Once | INTRAMUSCULAR | Status: DC | PRN

## 2021-06-16 MED ORDER — ONDANSETRON HCL 4 MG/2ML IJ SOLN
INTRAMUSCULAR | Status: AC
  Administered 2021-06-16: 4 mg via INTRAVENOUS
  Filled 2021-06-16: qty 2

## 2021-06-16 MED ORDER — ONDANSETRON HCL 4 MG/2ML IJ SOLN: 4.0000 mg | Freq: Once | INTRAMUSCULAR | Status: AC

## 2021-06-16 MED ORDER — CEFAZOLIN SODIUM-DEXTROSE 2-4 GM/100ML-% IV SOLN: 2.0000 g | Freq: Once | INTRAVENOUS | Status: DC

## 2021-06-16 MED ORDER — CEFAZOLIN (ANCEF) 1 G IV SOLR: INTRAVENOUS | Status: DC | PRN

## 2021-06-16 MED ORDER — MIDAZOLAM HCL 2 MG/2ML IJ SOLN
INTRAMUSCULAR | Status: DC | PRN
  Administered 2021-06-16: 1 mg via INTRAVENOUS

## 2021-06-16 MED ORDER — CHLORHEXIDINE GLUCONATE CLOTH 2 % EX PADS: 6.0000 | MEDICATED_PAD | Freq: Every day | CUTANEOUS | Status: DC

## 2021-06-16 MED ORDER — SODIUM CHLORIDE 0.9 % IV SOLN
1.0000 g | Freq: Once | INTRAVENOUS | Status: AC
  Administered 2021-06-16: 1 g via INTRAVENOUS

## 2021-06-16 MED ORDER — ONDANSETRON HCL 4 MG/2ML IJ SOLN
4.0000 mg | Freq: Four times a day (QID) | INTRAMUSCULAR | Status: DC | PRN
  Administered 2021-06-16: 4 mg via INTRAVENOUS

## 2021-06-16 MED ORDER — MIDAZOLAM HCL 2 MG/2ML IJ SOLN
INTRAMUSCULAR | Status: AC
  Filled 2021-06-16: qty 2

## 2021-06-16 MED ORDER — FAMOTIDINE 20 MG PO TABS: 40.0000 mg | ORAL_TABLET | Freq: Once | ORAL | Status: DC | PRN

## 2021-06-16 SURGICAL SUPPLY — 9 items
BIOPATCH RED 1 DISK 7.0 (GAUZE/BANDAGES/DRESSINGS) ×2 IMPLANT
CATH CANNON HEMO 15FR 19 (HEMODIALYSIS SUPPLIES) ×2 IMPLANT
COVER PROBE U/S 5X48 (MISCELLANEOUS) ×2 IMPLANT
DERMABOND ADVANCED (GAUZE/BANDAGES/DRESSINGS) ×1
DERMABOND ADVANCED .7 DNX12 (GAUZE/BANDAGES/DRESSINGS) ×1 IMPLANT
PACK ANGIOGRAPHY (CUSTOM PROCEDURE TRAY) ×2 IMPLANT
SUT MNCRL AB 4-0 PS2 18 (SUTURE) ×2 IMPLANT
SUT PROLENE 0 CT 1 30 (SUTURE) ×2 IMPLANT
TOWEL OR 17X26 4PK STRL BLUE (TOWEL DISPOSABLE) ×2 IMPLANT

## 2021-06-16 NOTE — Progress Notes (Signed)
Dr. Lucky Cowboy made aware of K+ level of 4.5 via I-STAT by C. Rollene Rotunda, RN, Camera operator. Await any changes in orders.

## 2021-06-16 NOTE — Progress Notes (Signed)
Pt. Vomited approx. 100 ml clear emesis post procedure. MD called. 1x order for Zofran 4 mg IV slow push given now. Pt. Taking saltine crackers and sips of gingerale now. HOB up 45 degrees.

## 2021-06-16 NOTE — Op Note (Signed)
OPERATIVE NOTE    PRE-OPERATIVE DIAGNOSIS: 1. ESRD   POST-OPERATIVE DIAGNOSIS: same as above  PROCEDURE: Ultrasound guidance for vascular access to the right internal jugular vein Fluoroscopic guidance for placement of catheter Placement of a 19 cm tip to cuff tunneled hemodialysis catheter via the right internal jugular vein  SURGEON: Leotis Pain, MD  ANESTHESIA:  Local with Moderate conscious sedation for approximately 19 minutes using 1 mg of Versed and 100 mcg of Fentanyl  ESTIMATED BLOOD LOSS: 5 cc  FLUORO TIME: less than one minute  CONTRAST: none  FINDING(S): 1.  Patent right internal jugular vein  SPECIMEN(S):  None  INDICATIONS:   Sharon Arias is a 60 y.o.female who presents with renal failure.  The patient needs long term dialysis access for their ESRD, and a Permcath is necessary.  Risks and benefits are discussed and informed consent is obtained.    DESCRIPTION: After obtaining full informed written consent, the patient was brought back to the vascular suited. The patient's right neck and chest were sterilely prepped and draped in a sterile surgical field was created. Moderate conscious sedation was administered during a face to face encounter with the patient throughout the procedure with my supervision of the RN administering medicines and monitoring the patient's vital signs, pulse oximetry, telemetry and mental status throughout from the start of the procedure until the patient was taken to the recovery room.  The right internal jugular vein was visualized with ultrasound and found to be patent. It was then accessed under direct ultrasound guidance and a permanent image was recorded. A wire was placed. After skin nick and dilatation, the peel-away sheath was placed over the wire. I then turned my attention to an area under the clavicle. Approximately 1-2 fingerbreadths below the clavicle a small counterincision was created and tunneled from the subclavicular incision  to the access site. Using fluoroscopic guidance, a 19 centimeter tip to cuff tunneled hemodialysis catheter was selected, and tunneled from the subclavicular incision to the access site. It was then placed through the peel-away sheath and the peel-away sheath was removed. Using fluoroscopic guidance the catheter tips were parked in the right atrium. The appropriate distal connectors were placed. It withdrew blood well and flushed easily with heparinized saline and a concentrated heparin solution was then placed. It was secured to the chest wall with 2 Prolene sutures. The access incision was closed single 4-0 Monocryl. A 4-0 Monocryl pursestring suture was placed around the exit site. Sterile dressings were placed. The patient tolerated the procedure well and was taken to the recovery room in stable condition.  COMPLICATIONS: None  CONDITION: Stable  Leotis Pain, MD 06/16/2021 4:12 PM   This note was created with Dragon Medical transcription system. Any errors in dictation are purely unintentional.

## 2021-06-16 NOTE — Interval H&P Note (Signed)
History and Physical Interval Note:  06/16/2021 1:47 PM  Sharon Arias  has presented today for surgery, with the diagnosis of Perm Cath Exchange   ESRD.  The various methods of treatment have been discussed with the patient and family. After consideration of risks, benefits and other options for treatment, the patient has consented to  Procedure(s): DIALYSIS/PERMA CATHETER INSERTION (N/A) as a surgical intervention.  The patient's history has been reviewed, patient examined, no change in status, stable for surgery.  I have reviewed the patient's chart and labs.  Questions were answered to the patient's satisfaction.     Leotis Pain

## 2021-06-17 ENCOUNTER — Encounter: Payer: Self-pay | Admitting: Vascular Surgery

## 2021-06-24 DIAGNOSIS — N186 End stage renal disease: Secondary | ICD-10-CM

## 2021-08-28 ENCOUNTER — Telehealth (INDEPENDENT_AMBULATORY_CARE_PROVIDER_SITE_OTHER): Payer: Self-pay

## 2021-08-28 NOTE — Telephone Encounter (Signed)
Patient will call back to be schedule for CVC removal.

## 2021-08-31 ENCOUNTER — Encounter (INDEPENDENT_AMBULATORY_CARE_PROVIDER_SITE_OTHER): Payer: Self-pay

## 2021-08-31 NOTE — Telephone Encounter (Signed)
Patient called back to be scheduled for CVC removal.

## 2021-08-31 NOTE — Telephone Encounter (Signed)
Patient is schedule for CVC (perma cath) removal with Dr Delana Meyer on 09/15/21 per patient request arrival time 6:45 am at [facility]. Pre-procedure instructions will be mailed to the patient.

## 2021-09-10 ENCOUNTER — Telehealth (INDEPENDENT_AMBULATORY_CARE_PROVIDER_SITE_OTHER): Payer: Self-pay | Admitting: Vascular Surgery

## 2021-09-10 NOTE — Telephone Encounter (Signed)
Patient is reschedule to 09/22/21 with Dr Delana Meyer and is aware with new date

## 2021-09-15 DIAGNOSIS — N186 End stage renal disease: Secondary | ICD-10-CM

## 2021-09-21 ENCOUNTER — Other Ambulatory Visit (INDEPENDENT_AMBULATORY_CARE_PROVIDER_SITE_OTHER): Payer: Self-pay | Admitting: Nurse Practitioner

## 2021-09-22 ENCOUNTER — Encounter: Payer: Self-pay | Admitting: Vascular Surgery

## 2021-09-22 ENCOUNTER — Ambulatory Visit
Admission: RE | Admit: 2021-09-22 | Discharge: 2021-09-22 | Disposition: A | Discharge status: Home / Self Care | Payer: Medicare Other | Attending: Vascular Surgery | Admitting: Vascular Surgery

## 2021-09-22 ENCOUNTER — Encounter
Admission: RE | Disposition: A | Discharge status: Home / Self Care | Payer: Self-pay | Source: Home / Self Care | Attending: Vascular Surgery

## 2021-09-22 DIAGNOSIS — I509 Heart failure, unspecified: Secondary | ICD-10-CM | POA: Diagnosis not present

## 2021-09-22 DIAGNOSIS — Z79899 Other long term (current) drug therapy: Secondary | ICD-10-CM | POA: Diagnosis not present

## 2021-09-22 DIAGNOSIS — Z794 Long term (current) use of insulin: Secondary | ICD-10-CM | POA: Insufficient documentation

## 2021-09-22 DIAGNOSIS — E1122 Type 2 diabetes mellitus with diabetic chronic kidney disease: Secondary | ICD-10-CM | POA: Diagnosis not present

## 2021-09-22 DIAGNOSIS — N186 End stage renal disease: Secondary | ICD-10-CM | POA: Diagnosis not present

## 2021-09-22 DIAGNOSIS — Z452 Encounter for adjustment and management of vascular access device: Secondary | ICD-10-CM

## 2021-09-22 DIAGNOSIS — Z7901 Long term (current) use of anticoagulants: Secondary | ICD-10-CM | POA: Diagnosis not present

## 2021-09-22 DIAGNOSIS — I2510 Atherosclerotic heart disease of native coronary artery without angina pectoris: Secondary | ICD-10-CM | POA: Diagnosis not present

## 2021-09-22 DIAGNOSIS — Z888 Allergy status to other drugs, medicaments and biological substances status: Secondary | ICD-10-CM | POA: Diagnosis not present

## 2021-09-22 DIAGNOSIS — I132 Hypertensive heart and chronic kidney disease with heart failure and with stage 5 chronic kidney disease, or end stage renal disease: Secondary | ICD-10-CM | POA: Insufficient documentation

## 2021-09-22 DIAGNOSIS — Z884 Allergy status to anesthetic agent status: Secondary | ICD-10-CM | POA: Insufficient documentation

## 2021-09-22 DIAGNOSIS — Z4901 Encounter for fitting and adjustment of extracorporeal dialysis catheter: Secondary | ICD-10-CM | POA: Diagnosis not present

## 2021-09-22 HISTORY — PX: DIALYSIS/PERMA CATHETER REMOVAL: CATH118289

## 2021-09-22 SURGERY — DIALYSIS/PERMA CATHETER REMOVAL
Anesthesia: LOCAL

## 2021-09-22 SURGICAL SUPPLY — 5 items
DERMABOND ADVANCED (GAUZE/BANDAGES/DRESSINGS) ×1
DERMABOND ADVANCED .7 DNX12 (GAUZE/BANDAGES/DRESSINGS) ×1 IMPLANT
FORCEPS HALSTEAD CVD 5IN STRL (INSTRUMENTS) ×2 IMPLANT
SUT MNCRL AB 4-0 PS2 18 (SUTURE) ×2 IMPLANT
TRAY LACERAT/PLASTIC (MISCELLANEOUS) ×2 IMPLANT

## 2021-09-22 NOTE — Discharge Instructions (Signed)
Tunneled Catheter Removal, Care After Refer to this sheet in the next few weeks. These instructions provide you with information about caring for yourself after your procedure. Your health care provider may also give you more specific instructions. Your treatment has been planned according to current medical practices, but problems sometimes occur. Call your health care provider if you have any problems or questions after your procedure. What can I expect after the procedure? After the procedure, it is common to have: Some mild redness, swelling, and pain around your catheter site.   Follow these instructions at home: Incision care  Check your removal site  every day for signs of infection. Check for: More redness, swelling, or pain. More fluid or blood. Warmth. Pus or a bad smell. Remove your dressing in 48hrs leave open to air  Activity  Return to your normal activities as told by your health care provider. Ask your health care provider what activities are safe for you. Do not lift anything that is heavier than 10 lb (4.5 kg) for 3 days  You may shower tomorrow  Contact a health care provider if: You have more fluid or blood coming from your removal site You have more redness, swelling, or pain at your incisions or around the area where your catheter was removed Your removal site feel warm to the touch. You feel unusually weak. You feel nauseous.. Get help right away if You have swelling in your arm, shoulder, neck, or face. You develop chest pain. You have difficulty breathing. You feel dizzy or light-headed. You have pus or a bad smell coming from your removal site You have a fever. You develop bleeding from your removal site, and your bleeding does not stop. This information is not intended to replace advice given to you by your health care provider. Make sure you discuss any questions you have with your health care provider. Document Released: 11/22/2012 Document Revised:  08/08/2016 Document Reviewed: 09/01/2015 Elsevier Interactive Patient Education  2017 [employer] 

## 2021-09-22 NOTE — H&P (Signed)
@LOGO @   MRN : [id number]  Sharon Arias is a 60 y.o. ([birth year]) female who presents with chief complaint of here to get my catheter out.  History of Present Illness:    The patient is seen for catheter removal.  Catheter was placed 06/16/2021.  Current access is via a catheter which is not being used.  There have not been episodes of catheter infection.  The patient had a left brachial cephalic fistula created 3/50/0938.  The patient denies amaurosis fugax or recent TIA symptoms. There are no recent neurological changes noted. The patient denies claudication symptoms or rest pain symptoms. The patient denies history of DVT, PE or superficial thrombophlebitis. The patient denies recent episodes of angina or shortness of breath.    Current Facility-Administered Medications for the 09/22/21 encounter [facility] Encounter)  Medication   Chlorhexidine Gluconate Cloth 2 % PADS 6 each   Current Meds  Medication Sig   acetaminophen (TYLENOL) 500 MG tablet Take 500-1,000 mg by mouth every 6 (six) hours as needed (PAIN.).   allopurinol (ZYLOPRIM) 300 MG tablet Take 300 mg by mouth in the morning.   amLODipine (NORVASC) 10 MG tablet Take 10 mg by mouth at bedtime.   aspirin EC 81 MG tablet Take 81 mg by mouth in the morning.   clopidogrel (PLAVIX) 75 MG tablet Take 1 tablet (75 mg total) by mouth daily. (Patient taking differently: Take 75 mg by mouth in the morning.)   hydrALAZINE (APRESOLINE) 50 MG tablet Take 50 mg by mouth in the morning and at bedtime.   Insulin Degludec (TRESIBA) 100 UNIT/ML SOLN Inject 75 mg into the skin at bedtime.   insulin lispro (HUMALOG) 100 UNIT/ML KwikPen Inject 15-40 Units into the skin See admin instructions. Pt. Uses 15 units/ml in the mornings, 30 units/ml in the lunch, and 40 units/ml at [employer] Sliding scale   lidocaine-prilocaine (EMLA) cream Apply 1 application topically as needed (prior to dialysis).   losartan (COZAAR) 100 MG tablet Take 100 mg by mouth  in the morning.   metoprolol tartrate (LOPRESSOR) 50 MG tablet Take 50 mg by mouth 2 (two) times daily.    multivitamin (RENA-VIT) TABS tablet Take 1 tablet by mouth at bedtime.   rosuvastatin (CRESTOR) 20 MG tablet Take 40 mg by mouth at bedtime.    Past Medical History:  Diagnosis Date   Anemia in chronic kidney disease 05/19/2016   Anxiety    Aortic atherosclerosis (HCC)    Blind    CAD (coronary artery disease)    Cardiac murmur    CHF (congestive heart failure) ([place])    G2DD on 02/2020 TTE   Chicken pox    Chronic anticoagulation    CVA (cerebral vascular accident) ([place])    DDD (degenerative disc disease), lumbar    Detached retina    Diabetic neuropathy ([place])    Diabetic retinopathy ([place])    legally blind   Dyspnea    ESRD needing dialysis ([place])    Hx of CABG    Hyperlipidemia    Hypertension    NSTEMI (non-ST elevated myocardial infarction) ([place]) 06/2012   Obesity    Pneumonia    PONV (postoperative nausea and vomiting)    Sciatica of right side    Sleep apnea    CPAP NIGHTLY   T2DM (type 2 diabetes mellitus) [facility])     Past Surgical History:  Procedure Laterality Date   A/V FISTULAGRAM Left 04/21/2021   Procedure: A/V FISTULAGRAM;  Surgeon: Katha Cabal, MD;  Location: [place] CV LAB;  Service: Cardiovascular;  Laterality: Left;   AV FISTULA PLACEMENT Left 02/06/2021   Procedure: ARTERIOVENOUS (AV) FISTULA CREATION (BRACHIAL CEPHALIC );  Surgeon: Katha Cabal, MD;  Location: [facility] ORS;  Service: Vascular;  Laterality: Left;   COLONOSCOPY WITH PROPOFOL N/A 01/26/2016   Procedure: COLONOSCOPY WITH PROPOFOL;  Surgeon: Lollie Sails, MD;  Location: [facility] ENDOSCOPY;  Service: Endoscopy;  Laterality: N/A;   COLONOSCOPY WITH PROPOFOL N/A 01/27/2016   Procedure: COLONOSCOPY WITH PROPOFOL;  Surgeon: Lollie Sails, MD;  Location: [facility] ENDOSCOPY;  Service: Endoscopy;  Laterality: N/A;   CORONARY ARTERY BYPASS GRAFT  2013   DIALYSIS/PERMA CATHETER  INSERTION N/A 02/03/2021   Procedure: DIALYSIS/PERMA CATHETER INSERTION;  Surgeon: Katha Cabal, MD;  Location: [place] CV LAB;  Service: Cardiovascular;  Laterality: N/A;   DIALYSIS/PERMA CATHETER INSERTION N/A 06/16/2021   Procedure: DIALYSIS/PERMA CATHETER INSERTION;  Surgeon: Algernon Huxley, MD;  Location: [place] CV LAB;  Service: Cardiovascular;  Laterality: N/A;   INCISION AND DRAINAGE     chest abscess   INCISION AND DRAINAGE ABSCESS N/A 12/11/2018   Procedure: INCISION AND DRAINAGE PERINEAL;  Surgeon: Jules Husbands, MD;  Location: [facility] ORS;  Service: General;  Laterality: N/A;   RETINAL LASER PROCEDURE      Social History Social History   Tobacco Use   Smoking status: Never   Smokeless tobacco: Never  Vaping Use   Vaping Use: Never used  Substance Use Topics   Alcohol use: No   Drug use: No    Family History Family History  Problem Relation Age of Onset   Breast cancer Mother    Prostate cancer Father        we think mets to liver and bone    Allergies  Allergen Reactions   Ozempic (0.25 Or 0.5 Mg-Dose) [Semaglutide(0.25 Or 0.5mg -Dos)] Diarrhea and Nausea Only   Trulicity [Dulaglutide] Diarrhea and Nausea Only   Other     Anesthesia--nausea/vomiting     REVIEW OF SYSTEMS (Negative unless checked)  Constitutional: [] Weight loss  [] Fever  [] Chills Cardiac: [] Chest pain   [] Chest pressure   [] Palpitations   [] Shortness of breath when laying flat   [] Shortness of breath with exertion. Vascular:  [] Pain in legs with walking   [] Pain in legs at rest  [] History of DVT   [] Phlebitis   [] Swelling in legs   [] Varicose veins   [] Non-healing ulcers Pulmonary:   [] Uses home oxygen   [] Productive cough   [] Hemoptysis   [] Wheeze  [] COPD   [] Asthma Neurologic:  [] Dizziness   [] Seizures   [] History of stroke   [] History of TIA  [] Aphasia   [] Vissual changes   [] Weakness or numbness in arm   [] Weakness or numbness in leg Musculoskeletal:   [] Joint swelling    [] Joint pain   [] Low back pain Hematologic:  [] Easy bruising  [] Easy bleeding   [] Hypercoagulable state   [] Anemic Gastrointestinal:  [] Diarrhea   [] Vomiting  [] Gastroesophageal reflux/heartburn   [] Difficulty swallowing. Genitourinary:  [x] Chronic kidney disease   [] Difficult urination  [] Frequent urination   [] Blood in urine Skin:  [] Rashes   [] Ulcers  Psychological:  [] History of anxiety   []  History of major depression.  Physical Examination  Vitals:   09/22/21 0708  BP: (!) 152/53  Pulse: 69  Resp: 12  Temp: 97.7 F (36.5 C)  TempSrc: Oral  SpO2: 99%  Weight: 115.7 kg  Height: 5\' 8"  (1.727 m)   Body mass index is  38.77 kg/m. Gen: WD/WN, NAD Head: [place]/AT, No temporalis wasting.  Ear/Nose/Throat: Hearing grossly intact, nares w/o erythema or drainage Eyes: PER, EOMI, sclera nonicteric.  Neck: Supple, no gross masses or lesions.  No JVD.  Pulmonary:  Good air movement, no audible wheezing, no use of accessory muscles.  Cardiac: RRR, precordium non-hyperdynamic. Vascular:   left brachial cephalic fistula with good thrill and good bruit; right IJ catheter CD&I Vessel Right Left  Radial Palpable Palpable  Brachial Palpable Palpable  Gastrointestinal: soft, non-distended. No guarding/no peritoneal signs.  Musculoskeletal: M/S 5/5 throughout.  No deformity.  Neurologic: CN 2-12 intact. Pain and light touch intact in extremities.  Symmetrical.  Speech is fluent. Motor exam as listed above. Psychiatric: Judgment intact, Mood & affect appropriate for pt's clinical situation. Dermatologic: No rashes or ulcers noted.  No changes consistent with cellulitis.   CBC Lab Results  Component Value Date   WBC 10.9 (H) 02/04/2021   HGB 9.2 (L) 02/06/2021   HCT 27.0 (L) 02/06/2021   MCV 92.3 02/04/2021   PLT 239 02/04/2021    BMET    Component Value Date/Time   NA 143 02/06/2021 0652   K 4.1 02/06/2021 0652   CL 114 (H) 02/06/2021 0652   CO2 18 (L) 02/04/2021 1217   GLUCOSE 207  (H) 02/06/2021 0652   BUN 75 (H) 02/06/2021 0652   CREATININE 4.40 (H) 02/06/2021 0652   CALCIUM 9.3 02/04/2021 1217   GFRNONAA 14 (L) 02/04/2021 1217   GFRAA 19 (L) 09/12/2020 1331   CrCl cannot be calculated (Patient's most recent lab result is older than the maximum 21 days allowed.).  COAG Lab Results  Component Value Date   INR 1.1 02/04/2021   INR 1.2 03/18/2020    Radiology No results found.   Assessment/Plan 1. Complication from renal dialysis device, sequela Recommend:   At this time the patient does not need their catheter   Patient should have her catheter removed   The risks, benefits and alternative therapies were reviewed in detail with the patient.  All questions were answered.  The patient agrees to proceed with surgery.     2. End stage renal disease ([place]) At the present time the patient has adequate dialysis access.   Continue hemodialysis as ordered without interruption.  Avoid nephrotoxic medications and dehydration.  Further plans per nephrology    3. Coronary artery disease involving native coronary artery of native heart without angina pectoris Continue cardiac and antihypertensive medications as already ordered and reviewed, no changes at this time.   Continue statin as ordered and reviewed, no changes at this time   Nitrates PRN for chest pain    4. Primary hypertension Continue antihypertensive medications as already ordered, these medications have been reviewed and there are no changes at this time.    5. Type 2 diabetes mellitus with diabetic nephropathy, without long-term current use of insulin ([place]) Continue hypoglycemic medications as already ordered, these medications have been reviewed and there are no changes at this time.   Hgb A1C to be monitored as already arranged by primary service    [name], MD  09/22/2021 7:54 AM

## 2021-09-22 NOTE — Op Note (Signed)
  OPERATIVE NOTE   PROCEDURE: Removal of a right IJ tunneled dialysis catheter  PRE-OPERATIVE DIAGNOSIS: Complication of dialysis catheter  POST-OPERATIVE DIAGNOSIS: Same  SURGEON: [name]  ANESTHESIA: Local anesthetic with 1% lidocaine with epinephrine   ESTIMATED BLOOD LOSS: Minimal   FINDING(S): 1. Catheter intact   SPECIMEN(S):  Catheter  INDICATIONS:   Sharon Arias is a 60 y.o. female who presents with a working left arm access.  She no longer needs her tunneled catheter and is being removed to prevent septic complications.  Risk and benefits were reviewed all questions answered patient agrees to proceed.  DESCRIPTION: After obtaining full informed written consent, the patient was positioned supine. The right IJ catheter and surrounding area is prepped and draped in a sterile fashion. The cuff was localized by palpation and noted to be greater than 3 cm from the exit site. After appropriate timeout is called, 1% lidocaine with epinephrine is infiltrated into the surrounding tissues around the cuff. Small transverse incision is created with an 11 blade scalpel and the dissection was carried down to expose the cuff of the tunneled catheter.  The catheter is then freed from the surrounding attachments and adhesions. Once the catheter has been freed circumferentially it is transected just distal to the cuff and subsequently removed in 2 pieces. Light pressure was held at the base of the neck. A 4-0 Monocryl was used close the tunnel in the subcutaneous space. The 4-0 Monocryl Monocryl was then used to close the skin in a subcuticular stitch. Dermabond is applied.  Antibiotic ointment and a sterile dressing is applied to the exit site. Patient tolerated procedure well and there were no complications.  COMPLICATIONS: None  CONDITION: Unchanged  [name]. [facility] Office: [phone]  09/22/2021,12:12 PM

## 2021-09-22 NOTE — Interval H&P Note (Signed)
History and Physical Interval Note:  09/22/2021 8:00 AM  Sharon Arias  has presented today for surgery, with the diagnosis of Perma Cath Removal   End Stage Renal.  The various methods of treatment have been discussed with the patient and family. After consideration of risks, benefits and other options for treatment, the patient has consented to  Procedure(s): DIALYSIS/PERMA CATHETER REMOVAL (N/A) as a surgical intervention.  The patient's history has been reviewed, patient examined, no change in status, stable for surgery.  I have reviewed the patient's chart and labs.  Questions were answered to the patient's satisfaction.     [name]

## 2021-11-03 ENCOUNTER — Other Ambulatory Visit: Payer: Self-pay | Admitting: Internal Medicine

## 2021-11-03 DIAGNOSIS — Z1231 Encounter for screening mammogram for malignant neoplasm of breast: Secondary | ICD-10-CM

## 2021-11-19 ENCOUNTER — Other Ambulatory Visit: Payer: Self-pay

## 2021-11-19 ENCOUNTER — Ambulatory Visit
Admission: RE | Admit: 2021-11-19 | Discharge: 2021-11-19 | Disposition: A | Discharge status: Home / Self Care | Payer: Medicare Other | Source: Ambulatory Visit | Attending: Internal Medicine | Admitting: Internal Medicine

## 2021-11-19 DIAGNOSIS — Z1231 Encounter for screening mammogram for malignant neoplasm of breast: Secondary | ICD-10-CM | POA: Diagnosis present

## 2022-03-24 ENCOUNTER — Other Ambulatory Visit (INDEPENDENT_AMBULATORY_CARE_PROVIDER_SITE_OTHER): Payer: Self-pay | Admitting: Internal Medicine

## 2022-03-24 DIAGNOSIS — N186 End stage renal disease: Secondary | ICD-10-CM

## 2022-03-25 ENCOUNTER — Encounter (INDEPENDENT_AMBULATORY_CARE_PROVIDER_SITE_OTHER): Payer: Self-pay | Admitting: Nurse Practitioner

## 2022-03-25 ENCOUNTER — Other Ambulatory Visit (INDEPENDENT_AMBULATORY_CARE_PROVIDER_SITE_OTHER): Payer: Self-pay | Admitting: Nurse Practitioner

## 2022-03-25 ENCOUNTER — Ambulatory Visit (INDEPENDENT_AMBULATORY_CARE_PROVIDER_SITE_OTHER): Payer: Medicare Other

## 2022-03-25 ENCOUNTER — Ambulatory Visit (INDEPENDENT_AMBULATORY_CARE_PROVIDER_SITE_OTHER): Payer: Medicare Other | Admitting: Nurse Practitioner

## 2022-03-25 VITALS — BP 131/70 | HR 65 | Resp 16 | Wt 254.0 lb

## 2022-03-25 DIAGNOSIS — N186 End stage renal disease: Secondary | ICD-10-CM

## 2022-03-25 DIAGNOSIS — I10 Essential (primary) hypertension: Secondary | ICD-10-CM | POA: Diagnosis not present

## 2022-03-25 DIAGNOSIS — E785 Hyperlipidemia, unspecified: Secondary | ICD-10-CM | POA: Diagnosis not present

## 2022-04-05 ENCOUNTER — Encounter (INDEPENDENT_AMBULATORY_CARE_PROVIDER_SITE_OTHER): Payer: Self-pay | Admitting: Nurse Practitioner

## 2022-04-05 NOTE — Progress Notes (Signed)
? ?Subjective:  ? ? Patient ID: Sharon Arias, female    DOB: 11/08/1961, 61 y.o.   MRN: [id number] ?Chief Complaint  ?Patient presents with  ? Follow-up  ?  Ultrasound follow up  ? ? ?The patient returns to the office for followup of their dialysis access.  ? ?The patient reports the function of the access has been stable. Patient denies difficulty with cannulation. The patient denies increased bleeding time after removing the needles. The patient denies hand pain or other symptoms consistent with steal phenomena.  No significant arm swelling. ? ?The patient denies any complaints from the dialysis center or their nephrologist. ? ?The patient denies redness or swelling at the access site. The patient denies fever or chills at home or while on dialysis. ? ?No recent shortening of the patient's walking distance or new symptoms consistent with claudication.  No history of rest pain symptoms. No new ulcers or wounds of the lower extremities have occurred. ? ?The patient denies amaurosis fugax or recent TIA symptoms. There are no recent neurological changes noted. ?There is no history of DVT, PE or superficial thrombophlebitis. ?No recent episodes of angina or shortness of breath documented.  ? ?Duplex ultrasound of the AV access shows a patent access.  The previously noted stenosis is not significantly changed compared to last study.  Flow volume today is 1796 cc/min (previous flow volume was 1677 cc/min) there is significant stenosis in the mid to distal segment. ?   ? ? ?Review of Systems  ?Hematological:  Does not bruise/bleed easily.  ?All other systems reviewed and are negative. ? ?   ?Objective:  ? Physical Exam ?Vitals reviewed.  ?HENT:  ?   Head: Normocephalic.  ?Cardiovascular:  ?   Rate and Rhythm: Normal rate.  ?   Pulses: Normal pulses.  ?   Arteriovenous access: Left arteriovenous access is present. ?   Comments: Good thrill and bruit ?Pulmonary:  ?   Effort: Pulmonary effort is normal.  ?Skin: ?   General:  Skin is warm and dry.  ?Neurological:  ?   Mental Status: She is alert and oriented to person, place, and time.  ?Psychiatric:     ?   Mood and Affect: Mood normal.     ?   Behavior: Behavior normal.     ?   Thought Content: Thought content normal.     ?   Judgment: Judgment normal.  ? ? ?BP 131/70 (BP Location: Right Arm)   Pulse 65   Resp 16   Wt 254 lb (115.2 kg)   BMI 38.62 kg/m?  ? ?Past Medical History:  ?Diagnosis Date  ? Anemia in chronic kidney disease 05/19/2016  ? Anxiety   ? Aortic atherosclerosis ([place])   ? Blind   ? CAD (coronary artery disease)   ? Cardiac murmur   ? CHF (congestive heart failure) ([place])   ? G2DD on 02/2020 TTE  ? Chicken pox   ? Chronic anticoagulation   ? CVA (cerebral vascular accident) [facility])   ? DDD (degenerative disc disease), lumbar   ? Detached retina   ? Diabetic neuropathy ([place])   ? Diabetic retinopathy ([place])   ? legally blind  ? Dyspnea   ? ESRD needing dialysis ([place])   ? Hx of CABG   ? Hyperlipidemia   ? Hypertension   ? NSTEMI (non-ST elevated myocardial infarction) ([place]) 06/2012  ? Obesity   ? Pneumonia   ? PONV (postoperative nausea and vomiting)   ?  Sciatica of right side   ? Sleep apnea   ? CPAP NIGHTLY  ? T2DM (type 2 diabetes mellitus) ([place])   ? ? ?Social History  ? ?Socioeconomic History  ? Marital status: Married  ?  Spouse name: Martina Brodbeck  ? Number of children: 3  ? Years of education: Not on file  ? Highest education level: Not on file  ?Occupational History  ? Not on file  ?Tobacco Use  ? Smoking status: Never  ? Smokeless tobacco: Never  ?Vaping Use  ? Vaping Use: Never used  ?Substance and Sexual Activity  ? Alcohol use: No  ? Drug use: No  ? Sexual activity: Not Currently  ?Other Topics Concern  ? Not on file  ?Social History Narrative  ? Lives at home with spouse   ? ?Social Determinants of Health  ? ?Financial Resource Strain: Not on file  ?Food Insecurity: Not on file  ?Transportation Needs: Not on file  ?Physical Activity: Not on file  ?Stress: Not on  file  ?Social Connections: Not on file  ?Intimate Partner Violence: Not on file  ? ? ?Past Surgical History:  ?Procedure Laterality Date  ? A/V FISTULAGRAM Left 04/21/2021  ? Procedure: A/V FISTULAGRAM;  Surgeon: Katha Cabal, MD;  Location: [place] [facility] LAB;  Service: Cardiovascular;  Laterality: Left;  ? AV FISTULA PLACEMENT Left 02/06/2021  ? Procedure: ARTERIOVENOUS (AV) FISTULA CREATION (BRACHIAL CEPHALIC );  Surgeon: Katha Cabal, MD;  Location: [facility] ORS;  Service: Vascular;  Laterality: Left;  ? COLONOSCOPY WITH PROPOFOL N/A 01/26/2016  ? Procedure: COLONOSCOPY WITH PROPOFOL;  Surgeon: Lollie Sails, MD;  Location: [facility] ENDOSCOPY;  Service: Endoscopy;  Laterality: N/A;  ? COLONOSCOPY WITH PROPOFOL N/A 01/27/2016  ? Procedure: COLONOSCOPY WITH PROPOFOL;  Surgeon: Lollie Sails, MD;  Location: [facility] ENDOSCOPY;  Service: Endoscopy;  Laterality: N/A;  ? CORONARY ARTERY BYPASS GRAFT  2013  ? DIALYSIS/PERMA CATHETER INSERTION N/A 02/03/2021  ? Procedure: DIALYSIS/PERMA CATHETER INSERTION;  Surgeon: Katha Cabal, MD;  Location: [place] [facility] LAB;  Service: Cardiovascular;  Laterality: N/A;  ? DIALYSIS/PERMA CATHETER INSERTION N/A 06/16/2021  ? Procedure: DIALYSIS/PERMA CATHETER INSERTION;  Surgeon: Algernon Huxley, MD;  Location: [place] CV LAB;  Service: Cardiovascular;  Laterality: N/A;  ? DIALYSIS/PERMA CATHETER REMOVAL N/A 09/22/2021  ? Procedure: DIALYSIS/PERMA CATHETER REMOVAL;  Surgeon: Katha Cabal, MD;  Location: [place] CV LAB;  Service: Cardiovascular;  Laterality: N/A;  ? INCISION AND DRAINAGE    ? chest abscess  ? INCISION AND DRAINAGE ABSCESS N/A 12/11/2018  ? Procedure: INCISION AND DRAINAGE PERINEAL;  Surgeon: Jules Husbands, MD;  Location: [facility] ORS;  Service: General;  Laterality: N/A;  ? RETINAL LASER PROCEDURE    ? ? ?Family History  ?Problem Relation Age of Onset  ? Breast cancer Mother   ? Prostate cancer Father   ?     we think mets to liver and bone   ? ? ?Allergies  ?Allergen Reactions  ? Ozempic (0.25 Or 0.5 Mg-Dose) [Semaglutide(0.25 Or 0.'5mg'$ -Dos)] Diarrhea and Nausea Only  ? Trulicity [Dulaglutide] Diarrhea and Nausea Only  ? Other   ?  Anesthesia--nausea/vomiting  ? ? ? ?  Latest Ref Rng & Units 02/06/2021  ?  6:52 AM 02/04/2021  ? 12:17 PM 12/26/2020  ?  1:16 PM  ?CBC  ?WBC 4.0 - 10.5 K/uL  10.9   10.0    ?Hemoglobin 12.0 - 15.0 g/dL 9.2   9.0   9.5    ?  Hematocrit 36.0 - 46.0 % 27.0   27.5   28.3    ?Platelets 150 - 400 K/uL  239   234    ? ? ? ? ?CMP  ?   ?Component Value Date/Time  ? NA 143 02/06/2021 0652  ? K 4.1 02/06/2021 0652  ? CL 114 (H) 02/06/2021 9432  ? CO2 18 (L) 02/04/2021 1217  ? GLUCOSE 207 (H) 02/06/2021 7614  ? BUN 75 (H) 02/06/2021 7092  ? CREATININE 4.40 (H) 02/06/2021 9574  ? CALCIUM 9.3 02/04/2021 1217  ? PROT 7.2 05/24/2020 0520  ? ALBUMIN 3.3 (L) 05/24/2020 0520  ? AST 17 05/24/2020 0520  ? ALT 29 05/24/2020 0520  ? ALKPHOS 68 05/24/2020 0520  ? BILITOT 0.6 05/24/2020 0520  ? GFRNONAA 14 (L) 02/04/2021 1217  ? GFRAA 19 (L) 09/12/2020 1331  ? ? ? ?No results found. ? ?   ?Assessment & Plan:  ? ?1. End stage renal disease ([place]) ?Recommend: ? ?The patient is doing well and currently has adequate dialysis access. ?The patient's dialysis center is not reporting any access issues. ?Flow pattern is stable when compared to the prior ultrasound. ? ?The patient should have a duplex ultrasound of the dialysis access in 6 months. ?The patient will follow-up with me in the office after each ultrasound  ?  ? ?2. Primary hypertension ?Continue antihypertensive medications as already ordered, these medications have been reviewed and there are no changes at this time.  ? ?3. Hyperlipidemia, unspecified hyperlipidemia type ?Continue statin as ordered and reviewed, no changes at this time  ? ? ?Current Outpatient Medications on File Prior to Visit  ?Medication Sig Dispense Refill  ? allopurinol (ZYLOPRIM) 300 MG tablet Take 300 mg by mouth in the morning.     ? amLODipine (NORVASC) 10 MG tablet Take 10 mg by mouth at bedtime.    ? aspirin EC 81 MG tablet Take 81 mg by mouth in the morning.    ? clopidogrel (PLAVIX) 75 MG tablet Take 1 tablet (75 mg total) by mout

## 2022-08-03 ENCOUNTER — Other Ambulatory Visit: Payer: Self-pay | Admitting: Family Medicine

## 2022-08-03 DIAGNOSIS — M5416 Radiculopathy, lumbar region: Secondary | ICD-10-CM

## 2022-08-05 ENCOUNTER — Ambulatory Visit
Admission: RE | Admit: 2022-08-05 | Discharge: 2022-08-05 | Disposition: A | Discharge status: Home / Self Care | Payer: Medicare Other | Source: Ambulatory Visit | Attending: Family Medicine | Admitting: Family Medicine

## 2022-08-05 DIAGNOSIS — M5416 Radiculopathy, lumbar region: Secondary | ICD-10-CM

## 2022-09-23 ENCOUNTER — Encounter (INDEPENDENT_AMBULATORY_CARE_PROVIDER_SITE_OTHER): Payer: Medicare Other

## 2022-09-23 ENCOUNTER — Ambulatory Visit (INDEPENDENT_AMBULATORY_CARE_PROVIDER_SITE_OTHER): Payer: Medicare Other | Admitting: Nurse Practitioner

## 2022-09-29 ENCOUNTER — Other Ambulatory Visit (INDEPENDENT_AMBULATORY_CARE_PROVIDER_SITE_OTHER): Payer: Self-pay | Admitting: Vascular Surgery

## 2022-09-29 DIAGNOSIS — N186 End stage renal disease: Secondary | ICD-10-CM

## 2022-09-30 ENCOUNTER — Encounter (INDEPENDENT_AMBULATORY_CARE_PROVIDER_SITE_OTHER): Payer: Medicare Other

## 2022-09-30 ENCOUNTER — Ambulatory Visit (INDEPENDENT_AMBULATORY_CARE_PROVIDER_SITE_OTHER): Payer: Medicare Other | Admitting: Nurse Practitioner

## 2022-10-18 ENCOUNTER — Encounter (INDEPENDENT_AMBULATORY_CARE_PROVIDER_SITE_OTHER): Payer: Self-pay

## 2022-10-21 ENCOUNTER — Ambulatory Visit (INDEPENDENT_AMBULATORY_CARE_PROVIDER_SITE_OTHER): Payer: Medicare Other

## 2022-10-21 ENCOUNTER — Ambulatory Visit (INDEPENDENT_AMBULATORY_CARE_PROVIDER_SITE_OTHER): Payer: Medicare Other | Admitting: Nurse Practitioner

## 2022-10-21 ENCOUNTER — Encounter (INDEPENDENT_AMBULATORY_CARE_PROVIDER_SITE_OTHER): Payer: Self-pay | Admitting: Nurse Practitioner

## 2022-10-21 VITALS — BP 164/67 | HR 62 | Resp 19 | Ht 67.5 in | Wt 254.6 lb

## 2022-10-21 DIAGNOSIS — E1121 Type 2 diabetes mellitus with diabetic nephropathy: Secondary | ICD-10-CM | POA: Diagnosis not present

## 2022-10-21 DIAGNOSIS — I10 Essential (primary) hypertension: Secondary | ICD-10-CM | POA: Diagnosis not present

## 2022-10-21 DIAGNOSIS — N186 End stage renal disease: Secondary | ICD-10-CM

## 2022-10-21 NOTE — Progress Notes (Signed)
Subjective:    Patient ID: Sharon Arias, female    DOB: 09/04/1961, 61 y.o.   MRN: [id number] No chief complaint on file.   The patient returns to the office for followup of their dialysis access.    The patient reports the function of the access has been stable. Patient denies difficulty with cannulation. The patient denies increased bleeding time after removing the needles. The patient denies hand pain or other symptoms consistent with steal phenomena.  No significant arm swelling.   The patient denies any complaints from the dialysis center or their nephrologist.   The patient denies redness or swelling at the access site. The patient denies fever or chills at home or while on dialysis.   No recent shortening of the patient's walking distance or new symptoms consistent with claudication.  No history of rest pain symptoms. No new ulcers or wounds of the lower extremities have occurred.   The patient denies amaurosis fugax or recent TIA symptoms. There are no recent neurological changes noted. There is no history of DVT, PE or superficial thrombophlebitis. No recent episodes of angina or shortness of breath documented.    Duplex ultrasound of the AV access shows a patent access.  The previously noted stenosis has significantly changed compared to last study.  Flow volume today is 817 cc/min (previous flow volume was 1496 cc/min) there is significant stenosis in the mid to distal segment.     Review of Systems  Constitutional: Negative.   Respiratory: Negative.    Cardiovascular: Negative.   Gastrointestinal: Negative.   Genitourinary: Negative.   Neurological: Negative.   Psychiatric/Behavioral: Negative.         Objective:   Physical Exam Constitutional:      Appearance: Normal appearance.  Eyes:     Pupils: Pupils are equal, round, and reactive to light.  Cardiovascular:     Pulses: Normal pulses.  Pulmonary:     Effort: Pulmonary effort is normal.  Abdominal:      General: Abdomen is flat. Bowel sounds are normal.     Palpations: Abdomen is soft.  Musculoskeletal:        General: Normal range of motion.     Cervical back: Normal range of motion.  Skin:    General: Skin is warm and dry.     Capillary Refill: Capillary refill takes less than 2 seconds.  Neurological:     General: No focal deficit present.     Mental Status: She is alert and oriented to person, place, and time.  Psychiatric:        Mood and Affect: Mood normal.        Behavior: Behavior normal.     BP (!) 164/67 (BP Location: Right Arm)   Pulse 62   Resp 19   Ht 5' 7.5" (1.715 m)   Wt 254 lb 9.6 oz (115.5 kg)   BMI 39.29 kg/m   Past Medical History:  Diagnosis Date   Anemia in chronic kidney disease 05/19/2016   Anxiety    Aortic atherosclerosis (HCC)    Blind    CAD (coronary artery disease)    Cardiac murmur    CHF (congestive heart failure) ([place])    G2DD on 02/2020 TTE   Chicken pox    Chronic anticoagulation    CVA (cerebral vascular accident) ([place])    DDD (degenerative disc disease), lumbar    Detached retina    Diabetic neuropathy ([place])    Diabetic retinopathy ([place])  legally blind   Dyspnea    ESRD needing dialysis ([place])    Hx of CABG    Hyperlipidemia    Hypertension    NSTEMI (non-ST elevated myocardial infarction) ([place]) 06/2012   Obesity    Pneumonia    PONV (postoperative nausea and vomiting)    Sciatica of right side    Sleep apnea    CPAP NIGHTLY   T2DM (type 2 diabetes mellitus) ([place])     Social History   Socioeconomic History   Marital status: Married    Spouse name: Mari Battaglia   Number of children: 3   Years of education: Not on file   Highest education level: Not on file  Occupational History   Not on file  Tobacco Use   Smoking status: Never   Smokeless tobacco: Never  Vaping Use   Vaping Use: Never used  Substance and Sexual Activity   Alcohol use: No   Drug use: No   Sexual activity: Not Currently  Other Topics  Concern   Not on file  Social History Narrative   Lives at home with spouse    Social Determinants of Health   Financial Resource Strain: Not on file  Food Insecurity: Not on file  Transportation Needs: Not on file  Physical Activity: Not on file  Stress: Not on file  Social Connections: Not on file  Intimate Partner Violence: Not on file    Past Surgical History:  Procedure Laterality Date   A/V FISTULAGRAM Left 04/21/2021   Procedure: A/V FISTULAGRAM;  Surgeon: Katha Cabal, MD;  Location: [place] [facility];  Service: Cardiovascular;  Laterality: Left;   AV FISTULA PLACEMENT Left 02/06/2021   Procedure: ARTERIOVENOUS (AV) FISTULA CREATION (BRACHIAL CEPHALIC );  Surgeon: Katha Cabal, MD;  Location: [facility];  Service: Vascular;  Laterality: Left;   COLONOSCOPY WITH PROPOFOL N/A 01/26/2016   Procedure: COLONOSCOPY WITH PROPOFOL;  Surgeon: Lollie Sails, MD;  Location: [facility] ENDOSCOPY;  Service: Endoscopy;  Laterality: N/A;   COLONOSCOPY WITH PROPOFOL N/A 01/27/2016   Procedure: COLONOSCOPY WITH PROPOFOL;  Surgeon: Lollie Sails, MD;  Location: [facility] ENDOSCOPY;  Service: Endoscopy;  Laterality: N/A;   CORONARY ARTERY BYPASS GRAFT  2013   DIALYSIS/PERMA CATHETER INSERTION N/A 02/03/2021   Procedure: DIALYSIS/PERMA CATHETER INSERTION;  Surgeon: Katha Cabal, MD;  Location: [place] [facility];  Service: Cardiovascular;  Laterality: N/A;   DIALYSIS/PERMA CATHETER INSERTION N/A 06/16/2021   Procedure: DIALYSIS/PERMA CATHETER INSERTION;  Surgeon: Algernon Huxley, MD;  Location: [place] [facility];  Service: Cardiovascular;  Laterality: N/A;   DIALYSIS/PERMA CATHETER REMOVAL N/A 09/22/2021   Procedure: DIALYSIS/PERMA CATHETER REMOVAL;  Surgeon: Katha Cabal, MD;  Location: [place] [facility];  Service: Cardiovascular;  Laterality: N/A;   INCISION AND DRAINAGE     chest abscess   INCISION AND DRAINAGE ABSCESS N/A 12/11/2018   Procedure: INCISION AND DRAINAGE  PERINEAL;  Surgeon: Jules Husbands, MD;  Location: [facility] ORS;  Service: General;  Laterality: N/A;   RETINAL LASER PROCEDURE      Family History  Problem Relation Age of Onset   Breast cancer Mother    Prostate cancer Father        we think mets to liver and bone    Allergies  Allergen Reactions   Ozempic (0.25 Or 0.5 Mg-Dose) [Semaglutide(0.25 Or 0.'5mg'$ -Dos)] Diarrhea and Nausea Only   Trulicity [Dulaglutide] Diarrhea and Nausea Only   Other     Anesthesia--nausea/vomiting  Latest Ref Rng & Units 02/06/2021    6:52 AM 02/04/2021   12:17 PM 12/26/2020    1:16 PM  CBC  WBC 4.0 - 10.5 K/uL  10.9  10.0   Hemoglobin 12.0 - 15.0 g/dL 9.2  9.0  9.5   Hematocrit 36.0 - 46.0 % 27.0  27.5  28.3   Platelets 150 - 400 K/uL  239  234       CMP     Component Value Date/Time   NA 143 02/06/2021 0652   K 4.1 02/06/2021 0652   CL 114 (H) 02/06/2021 0652   CO2 18 (L) 02/04/2021 1217   GLUCOSE 207 (H) 02/06/2021 0652   BUN 75 (H) 02/06/2021 0652   CREATININE 4.40 (H) 02/06/2021 0652   CALCIUM 9.3 02/04/2021 1217   PROT 7.2 05/24/2020 0520   ALBUMIN 3.3 (L) 05/24/2020 0520   AST 17 05/24/2020 0520   ALT 29 05/24/2020 0520   ALKPHOS 68 05/24/2020 0520   BILITOT 0.6 05/24/2020 0520   GFRNONAA 14 (L) 02/04/2021 1217   GFRAA 19 (L) 09/12/2020 1331     No results found.     Assessment & Plan:   1. ESRD (end stage renal disease) ([place])  The patient would like to proceed with a fistulogram prior to the [place] season.  This is due to the significant decline in her flow volume as well as some noted significant stenosis.   A brief description of the procedure and conscious sedation were discussed with the patient. She verbalized her understanding and would like to proceed.   2. Primary hypertension Continue antihypertensive medications as already ordered, these medications have been reviewed and there are no changes at this time.  3. Type 2 diabetes mellitus with diabetic  nephropathy, without long-term current use of insulin (HCC) Continue hypoglycemic medications as already ordered, these medications have been reviewed and there are no changes at this time.  Hgb A1C to be monitored as already arranged by primary service   Current Outpatient Medications on File Prior to Visit  Medication Sig Dispense Refill   allopurinol (ZYLOPRIM) 300 MG tablet Take 300 mg by mouth in the morning.     amLODipine (NORVASC) 10 MG tablet Take 10 mg by mouth at bedtime.     aspirin EC 81 MG tablet Take 81 mg by mouth in the morning.     clopidogrel (PLAVIX) 75 MG tablet Take 1 tablet (75 mg total) by mouth daily. (Patient taking differently: Take 75 mg by mouth in the morning.) 30 tablet 0   hydrALAZINE (APRESOLINE) 50 MG tablet Take 50 mg by mouth in the morning and at bedtime.     Insulin Degludec (TRESIBA) 100 UNIT/ML SOLN Inject 75 mg into the skin at bedtime.     insulin lispro (HUMALOG) 100 UNIT/ML KwikPen Inject 15-40 Units into the skin See admin instructions. Pt. Uses 15 units/ml in the mornings, 30 units/ml in the lunch, and 40 units/ml at [employer] Sliding scale     lidocaine-prilocaine (EMLA) cream Apply 1 application topically as needed (prior to dialysis).     losartan (COZAAR) 100 MG tablet Take 100 mg by mouth in the morning.     metoprolol tartrate (LOPRESSOR) 50 MG tablet Take 50 mg by mouth 2 (two) times daily.      multivitamin (RENA-VIT) TABS tablet Take 1 tablet by mouth at bedtime.     ONETOUCH VERIO test strip      rosuvastatin (CRESTOR) 20 MG tablet Take 40 mg by  mouth at bedtime.     Current Facility-Administered Medications on File Prior to Visit  Medication Dose Route Frequency Provider Last Rate Last Admin   Chlorhexidine Gluconate Cloth 2 % PADS 6 each  6 each Topical Q0600 Kris Hartmann, NP        There are no Patient Instructions on file for this visit. No follow-ups on file.   Kris Hartmann, NP

## 2022-10-21 NOTE — H&P (View-Only) (Signed)
Subjective:    Patient ID: Sharon Arias, female    DOB: Oct 17, 1961, 61 y.o.   MRN: [id number] No chief complaint on file.   The patient returns to the office for followup of their dialysis access.    The patient reports the function of the access has been stable. Patient denies difficulty with cannulation. The patient denies increased bleeding time after removing the needles. The patient denies hand pain or other symptoms consistent with steal phenomena.  No significant arm swelling.   The patient denies any complaints from the dialysis center or their nephrologist.   The patient denies redness or swelling at the access site. The patient denies fever or chills at home or while on dialysis.   No recent shortening of the patient's walking distance or new symptoms consistent with claudication.  No history of rest pain symptoms. No new ulcers or wounds of the lower extremities have occurred.   The patient denies amaurosis fugax or recent TIA symptoms. There are no recent neurological changes noted. There is no history of DVT, PE or superficial thrombophlebitis. No recent episodes of angina or shortness of breath documented.    Duplex ultrasound of the AV access shows a patent access.  The previously noted stenosis has significantly changed compared to last study.  Flow volume today is 817 cc/min (previous flow volume was 1496 cc/min) there is significant stenosis in the mid to distal segment.     Review of Systems  Constitutional: Negative.   Respiratory: Negative.    Cardiovascular: Negative.   Gastrointestinal: Negative.   Genitourinary: Negative.   Neurological: Negative.   Psychiatric/Behavioral: Negative.         Objective:   Physical Exam Constitutional:      Appearance: Normal appearance.  Eyes:     Pupils: Pupils are equal, round, and reactive to light.  Cardiovascular:     Pulses: Normal pulses.  Pulmonary:     Effort: Pulmonary effort is normal.  Abdominal:      General: Abdomen is flat. Bowel sounds are normal.     Palpations: Abdomen is soft.  Musculoskeletal:        General: Normal range of motion.     Cervical back: Normal range of motion.  Skin:    General: Skin is warm and dry.     Capillary Refill: Capillary refill takes less than 2 seconds.  Neurological:     General: No focal deficit present.     Mental Status: She is alert and oriented to person, place, and time.  Psychiatric:        Mood and Affect: Mood normal.        Behavior: Behavior normal.     BP (!) 164/67 (BP Location: Right Arm)   Pulse 62   Resp 19   Ht 5' 7.5" (1.715 m)   Wt 254 lb 9.6 oz (115.5 kg)   BMI 39.29 kg/m   Past Medical History:  Diagnosis Date   Anemia in chronic kidney disease 05/19/2016   Anxiety    Aortic atherosclerosis (HCC)    Blind    CAD (coronary artery disease)    Cardiac murmur    CHF (congestive heart failure) ([place])    G2DD on 02/2020 TTE   Chicken pox    Chronic anticoagulation    CVA (cerebral vascular accident) ([place])    DDD (degenerative disc disease), lumbar    Detached retina    Diabetic neuropathy ([place])    Diabetic retinopathy ([place])  legally blind   Dyspnea    ESRD needing dialysis ([place])    Hx of CABG    Hyperlipidemia    Hypertension    NSTEMI (non-ST elevated myocardial infarction) ([place]) 06/2012   Obesity    Pneumonia    PONV (postoperative nausea and vomiting)    Sciatica of right side    Sleep apnea    CPAP NIGHTLY   T2DM (type 2 diabetes mellitus) ([place])     Social History   Socioeconomic History   Marital status: Married    Spouse name: Kaylene Dawn   Number of children: 3   Years of education: Not on file   Highest education level: Not on file  Occupational History   Not on file  Tobacco Use   Smoking status: Never   Smokeless tobacco: Never  Vaping Use   Vaping Use: Never used  Substance and Sexual Activity   Alcohol use: No   Drug use: No   Sexual activity: Not Currently  Other Topics  Concern   Not on file  Social History Narrative   Lives at home with spouse    Social Determinants of Health   Financial Resource Strain: Not on file  Food Insecurity: Not on file  Transportation Needs: Not on file  Physical Activity: Not on file  Stress: Not on file  Social Connections: Not on file  Intimate Partner Violence: Not on file    Past Surgical History:  Procedure Laterality Date   A/V FISTULAGRAM Left 04/21/2021   Procedure: A/V FISTULAGRAM;  Surgeon: Katha Cabal, MD;  Location: [place] [facility];  Service: Cardiovascular;  Laterality: Left;   AV FISTULA PLACEMENT Left 02/06/2021   Procedure: ARTERIOVENOUS (AV) FISTULA CREATION (BRACHIAL CEPHALIC );  Surgeon: Katha Cabal, MD;  Location: [facility];  Service: Vascular;  Laterality: Left;   COLONOSCOPY WITH PROPOFOL N/A 01/26/2016   Procedure: COLONOSCOPY WITH PROPOFOL;  Surgeon: Lollie Sails, MD;  Location: [facility] ENDOSCOPY;  Service: Endoscopy;  Laterality: N/A;   COLONOSCOPY WITH PROPOFOL N/A 01/27/2016   Procedure: COLONOSCOPY WITH PROPOFOL;  Surgeon: Lollie Sails, MD;  Location: [facility] ENDOSCOPY;  Service: Endoscopy;  Laterality: N/A;   CORONARY ARTERY BYPASS GRAFT  2013   DIALYSIS/PERMA CATHETER INSERTION N/A 02/03/2021   Procedure: DIALYSIS/PERMA CATHETER INSERTION;  Surgeon: Katha Cabal, MD;  Location: [place] [facility];  Service: Cardiovascular;  Laterality: N/A;   DIALYSIS/PERMA CATHETER INSERTION N/A 06/16/2021   Procedure: DIALYSIS/PERMA CATHETER INSERTION;  Surgeon: Algernon Huxley, MD;  Location: [place] [facility];  Service: Cardiovascular;  Laterality: N/A;   DIALYSIS/PERMA CATHETER REMOVAL N/A 09/22/2021   Procedure: DIALYSIS/PERMA CATHETER REMOVAL;  Surgeon: Katha Cabal, MD;  Location: [place] [facility];  Service: Cardiovascular;  Laterality: N/A;   INCISION AND DRAINAGE     chest abscess   INCISION AND DRAINAGE ABSCESS N/A 12/11/2018   Procedure: INCISION AND DRAINAGE  PERINEAL;  Surgeon: Jules Husbands, MD;  Location: [facility];  Service: General;  Laterality: N/A;   RETINAL LASER PROCEDURE      Family History  Problem Relation Age of Onset   Breast cancer Mother    Prostate cancer Father        we think mets to liver and bone    Allergies  Allergen Reactions   Ozempic (0.25 Or 0.5 Mg-Dose) [Semaglutide(0.25 Or 0.'5mg'$ -Dos)] Diarrhea and Nausea Only   Trulicity [Dulaglutide] Diarrhea and Nausea Only   Other     Anesthesia--nausea/vomiting  Latest Ref Rng & Units 02/06/2021    6:52 AM 02/04/2021   12:17 PM 12/26/2020    1:16 PM  CBC  WBC 4.0 - 10.5 K/uL  10.9  10.0   Hemoglobin 12.0 - 15.0 g/dL 9.2  9.0  9.5   Hematocrit 36.0 - 46.0 % 27.0  27.5  28.3   Platelets 150 - 400 K/uL  239  234       CMP     Component Value Date/Time   NA 143 02/06/2021 0652   K 4.1 02/06/2021 0652   CL 114 (H) 02/06/2021 0652   CO2 18 (L) 02/04/2021 1217   GLUCOSE 207 (H) 02/06/2021 0652   BUN 75 (H) 02/06/2021 0652   CREATININE 4.40 (H) 02/06/2021 0652   CALCIUM 9.3 02/04/2021 1217   PROT 7.2 05/24/2020 0520   ALBUMIN 3.3 (L) 05/24/2020 0520   AST 17 05/24/2020 0520   ALT 29 05/24/2020 0520   ALKPHOS 68 05/24/2020 0520   BILITOT 0.6 05/24/2020 0520   GFRNONAA 14 (L) 02/04/2021 1217   GFRAA 19 (L) 09/12/2020 1331     No results found.     Assessment & Plan:   1. ESRD (end stage renal disease) ([place])  The patient would like to proceed with a fistulogram prior to the [place] season.  This is due to the significant decline in her flow volume as well as some noted significant stenosis.   A brief description of the procedure and conscious sedation were discussed with the patient. She verbalized her understanding and would like to proceed.   2. Primary hypertension Continue antihypertensive medications as already ordered, these medications have been reviewed and there are no changes at this time.  3. Type 2 diabetes mellitus with diabetic  nephropathy, without long-term current use of insulin (HCC) Continue hypoglycemic medications as already ordered, these medications have been reviewed and there are no changes at this time.  Hgb A1C to be monitored as already arranged by primary service   Current Outpatient Medications on File Prior to Visit  Medication Sig Dispense Refill   allopurinol (ZYLOPRIM) 300 MG tablet Take 300 mg by mouth in the morning.     amLODipine (NORVASC) 10 MG tablet Take 10 mg by mouth at bedtime.     aspirin EC 81 MG tablet Take 81 mg by mouth in the morning.     clopidogrel (PLAVIX) 75 MG tablet Take 1 tablet (75 mg total) by mouth daily. (Patient taking differently: Take 75 mg by mouth in the morning.) 30 tablet 0   hydrALAZINE (APRESOLINE) 50 MG tablet Take 50 mg by mouth in the morning and at bedtime.     Insulin Degludec (TRESIBA) 100 UNIT/ML SOLN Inject 75 mg into the skin at bedtime.     insulin lispro (HUMALOG) 100 UNIT/ML KwikPen Inject 15-40 Units into the skin See admin instructions. Pt. Uses 15 units/ml in the mornings, 30 units/ml in the lunch, and 40 units/ml at [employer] Sliding scale     lidocaine-prilocaine (EMLA) cream Apply 1 application topically as needed (prior to dialysis).     losartan (COZAAR) 100 MG tablet Take 100 mg by mouth in the morning.     metoprolol tartrate (LOPRESSOR) 50 MG tablet Take 50 mg by mouth 2 (two) times daily.      multivitamin (RENA-VIT) TABS tablet Take 1 tablet by mouth at bedtime.     ONETOUCH VERIO test strip      rosuvastatin (CRESTOR) 20 MG tablet Take 40 mg by  mouth at bedtime.     Current Facility-Administered Medications on File Prior to Visit  Medication Dose Route Frequency Provider Last Rate Last Admin   Chlorhexidine Gluconate Cloth 2 % PADS 6 each  6 each Topical Q0600 Kris Hartmann, NP        There are no Patient Instructions on file for this visit. No follow-ups on file.   Kris Hartmann, NP

## 2022-10-26 ENCOUNTER — Other Ambulatory Visit: Payer: Self-pay | Admitting: Internal Medicine

## 2022-10-26 DIAGNOSIS — Z1231 Encounter for screening mammogram for malignant neoplasm of breast: Secondary | ICD-10-CM

## 2022-11-01 ENCOUNTER — Telehealth (INDEPENDENT_AMBULATORY_CARE_PROVIDER_SITE_OTHER): Payer: Self-pay

## 2022-11-01 NOTE — Telephone Encounter (Signed)
Spoke with the patient and she is scheduled with Dr. Delana Meyer for a Left arm fistulagram on 11/02/22 with a 1:15 pm arrival time to the [facility]. Pre-procedure instructions were discussed and patient stated she understood.

## 2022-11-02 ENCOUNTER — Other Ambulatory Visit: Payer: Self-pay

## 2022-11-02 ENCOUNTER — Encounter: Payer: Self-pay | Admitting: Vascular Surgery

## 2022-11-02 ENCOUNTER — Ambulatory Visit
Admission: RE | Admit: 2022-11-02 | Discharge: 2022-11-02 | Disposition: A | Discharge status: Home / Self Care | Payer: Medicare Other | Attending: Vascular Surgery | Admitting: Vascular Surgery

## 2022-11-02 ENCOUNTER — Encounter
Admission: RE | Disposition: A | Discharge status: Home / Self Care | Payer: Self-pay | Source: Home / Self Care | Attending: Vascular Surgery

## 2022-11-02 DIAGNOSIS — E1122 Type 2 diabetes mellitus with diabetic chronic kidney disease: Secondary | ICD-10-CM | POA: Diagnosis not present

## 2022-11-02 DIAGNOSIS — N186 End stage renal disease: Secondary | ICD-10-CM | POA: Diagnosis not present

## 2022-11-02 DIAGNOSIS — Y841 Kidney dialysis as the cause of abnormal reaction of the patient, or of later complication, without mention of misadventure at the time of the procedure: Secondary | ICD-10-CM | POA: Insufficient documentation

## 2022-11-02 DIAGNOSIS — T82510A Breakdown (mechanical) of surgically created arteriovenous fistula, initial encounter: Secondary | ICD-10-CM | POA: Insufficient documentation

## 2022-11-02 DIAGNOSIS — I120 Hypertensive chronic kidney disease with stage 5 chronic kidney disease or end stage renal disease: Secondary | ICD-10-CM | POA: Diagnosis not present

## 2022-11-02 DIAGNOSIS — Z794 Long term (current) use of insulin: Secondary | ICD-10-CM | POA: Diagnosis not present

## 2022-11-02 DIAGNOSIS — T82858A Stenosis of vascular prosthetic devices, implants and grafts, initial encounter: Secondary | ICD-10-CM | POA: Diagnosis not present

## 2022-11-02 DIAGNOSIS — Z992 Dependence on renal dialysis: Secondary | ICD-10-CM | POA: Diagnosis not present

## 2022-11-02 HISTORY — PX: A/V FISTULAGRAM: CATH118298

## 2022-11-02 LAB — POTASSIUM (ARMC VASCULAR LAB ONLY): Potassium (ARMC vascular lab): 3.8 mmol/L (ref 3.5–5.1)

## 2022-11-02 LAB — GLUCOSE, CAPILLARY: Glucose-Capillary: 203 mg/dL — ABNORMAL HIGH (ref 70–99)

## 2022-11-02 SURGERY — A/V FISTULAGRAM
Anesthesia: Moderate Sedation | Laterality: Left

## 2022-11-02 MED ORDER — FENTANYL CITRATE (PF) 100 MCG/2ML IJ SOLN
INTRAMUSCULAR | Status: DC | PRN
  Administered 2022-11-02: 50 ug via INTRAVENOUS

## 2022-11-02 MED ORDER — HYDROMORPHONE HCL 1 MG/ML IJ SOLN: 1.0000 mg | Freq: Once | INTRAMUSCULAR | Status: DC | PRN

## 2022-11-02 MED ORDER — FENTANYL CITRATE (PF) 100 MCG/2ML IJ SOLN
INTRAMUSCULAR | Status: AC
  Filled 2022-11-02: qty 2

## 2022-11-02 MED ORDER — IODIXANOL 320 MG/ML IV SOLN
INTRAVENOUS | Status: DC | PRN
  Administered 2022-11-02: 45 mL

## 2022-11-02 MED ORDER — MIDAZOLAM HCL 2 MG/ML PO SYRP: 8.0000 mg | ORAL_SOLUTION | Freq: Once | ORAL | Status: DC | PRN

## 2022-11-02 MED ORDER — MIDAZOLAM HCL 2 MG/2ML IJ SOLN
INTRAMUSCULAR | Status: DC | PRN
  Administered 2022-11-02: 2 mg via INTRAVENOUS

## 2022-11-02 MED ORDER — HEPARIN SODIUM (PORCINE) 1000 UNIT/ML IJ SOLN
INTRAMUSCULAR | Status: AC
  Filled 2022-11-02: qty 10

## 2022-11-02 MED ORDER — HEPARIN SODIUM (PORCINE) 1000 UNIT/ML IJ SOLN
INTRAMUSCULAR | Status: DC | PRN
  Administered 2022-11-02: 4000 [IU] via INTRAVENOUS

## 2022-11-02 MED ORDER — METHYLPREDNISOLONE SODIUM SUCC 125 MG IJ SOLR: 125.0000 mg | Freq: Once | INTRAMUSCULAR | Status: DC | PRN

## 2022-11-02 MED ORDER — CEFAZOLIN SODIUM-DEXTROSE 1-4 GM/50ML-% IV SOLN
INTRAVENOUS | Status: AC
  Administered 2022-11-02: 1 g via INTRAVENOUS
  Filled 2022-11-02: qty 50

## 2022-11-02 MED ORDER — ONDANSETRON HCL 4 MG/2ML IJ SOLN: 4.0000 mg | Freq: Four times a day (QID) | INTRAMUSCULAR | Status: DC | PRN

## 2022-11-02 MED ORDER — SODIUM CHLORIDE 0.9 % IV SOLN
25.0000 mg | INTRAVENOUS | Status: AC
  Administered 2022-11-02: 25 mg via INTRAVENOUS
  Filled 2022-11-02: qty 1

## 2022-11-02 MED ORDER — DIPHENHYDRAMINE HCL 50 MG/ML IJ SOLN: 50.0000 mg | Freq: Once | INTRAMUSCULAR | Status: DC | PRN

## 2022-11-02 MED ORDER — MIDAZOLAM HCL 2 MG/2ML IJ SOLN
INTRAMUSCULAR | Status: AC
  Filled 2022-11-02: qty 2

## 2022-11-02 MED ORDER — SODIUM CHLORIDE 0.9 % IV SOLN: INTRAVENOUS | Status: DC

## 2022-11-02 MED ORDER — CEFAZOLIN SODIUM-DEXTROSE 1-4 GM/50ML-% IV SOLN: 1.0000 g | INTRAVENOUS | Status: AC

## 2022-11-02 MED ORDER — FAMOTIDINE 20 MG PO TABS: 40.0000 mg | ORAL_TABLET | Freq: Once | ORAL | Status: DC | PRN

## 2022-11-02 SURGICAL SUPPLY — 21 items
BALLN DORADO 10X40X80 (BALLOONS) ×1
BALLN DORADO 8X40X80 (BALLOONS) ×1
BALLN LUTONIX AV 6X60X75 (BALLOONS) ×2
BALLN ULTRVRSE 8X40X75C (BALLOONS) ×1
BALLOON DORADO 10X40X80 (BALLOONS) IMPLANT
BALLOON DORADO 8X40X80 (BALLOONS) IMPLANT
BALLOON LUTONIX AV 6X60X75 (BALLOONS) IMPLANT
BALLOON ULTRVRSE 8X40X75C (BALLOONS) IMPLANT
CATH KUMPE SOFT-VU 5FR 65 (CATHETERS) IMPLANT
COVER PROBE ULTRASOUND 5X96 (MISCELLANEOUS) IMPLANT
DRAPE BRACHIAL (DRAPES) IMPLANT
GOWN STRL REUS W/ TWL LRG LVL3 (GOWN DISPOSABLE) ×1 IMPLANT
GOWN STRL REUS W/TWL LRG LVL3 (GOWN DISPOSABLE) ×1
KIT ENCORE 26 ADVANTAGE (KITS) IMPLANT
NDL ENTRY 21GA 7CM ECHOTIP (NEEDLE) IMPLANT
NEEDLE ENTRY 21GA 7CM ECHOTIP (NEEDLE) ×1 IMPLANT
PACK ANGIOGRAPHY (CUSTOM PROCEDURE TRAY) ×1 IMPLANT
SET INTRO CAPELLA COAXIAL (SET/KITS/TRAYS/PACK) IMPLANT
SHEATH BRITE TIP 6FRX5.5 (SHEATH) IMPLANT
SUT MNCRL AB 4-0 PS2 18 (SUTURE) IMPLANT
WIRE SUPRACORE 190CM (MISCELLANEOUS) IMPLANT

## 2022-11-02 NOTE — Interval H&P Note (Signed)
History and Physical Interval Note:  11/02/2022 2:14 PM  Sharon Arias  has presented today for surgery, with the diagnosis of L Fistulgram   End Stage Renal.  The various methods of treatment have been discussed with the patient and family. After consideration of risks, benefits and other options for treatment, the patient has consented to  Procedure(s): A/V Fistulagram (Left) as a surgical intervention.  The patient's history has been reviewed, patient examined, no change in status, stable for surgery.  I have reviewed the patient's chart and labs.  Questions were answered to the patient's satisfaction.     [name]

## 2022-11-02 NOTE — Progress Notes (Signed)
Per patient request, fingerstick POCT CBG not performed. Patient wearing continuous glucose monitor; patient's glucose level 163 per patient. Patient awake, alert and acting appropriately. Patient given meal tray and beverage.

## 2022-11-02 NOTE — Op Note (Signed)
OPERATIVE NOTE   PROCEDURE: Contrast injection left brachiocephalic AV access Percutaneous transluminal angioplasty peripheral segment in 2 locations  PRE-OPERATIVE DIAGNOSIS: Complication of dialysis access                                                       End Stage Renal Disease  POST-OPERATIVE DIAGNOSIS: same as above   SURGEON: Katha Cabal, M.D.  ANESTHESIA: Conscious sedation was administered under my direct supervision by the interventional radiology RN. IV Versed plus fentanyl were utilized. Continuous ECG, pulse oximetry and blood pressure was monitored throughout the entire procedure.  Conscious sedation was for a total of 45 minutes and 54 seconds.  ESTIMATED BLOOD LOSS: minimal  FINDING(S): Stricture of the AV graft  SPECIMEN(S):  None  CONTRAST: 45 cc  FLUOROSCOPY TIME: 5.7 minutes  INDICATIONS: Sharon Arias is a 61 y.o. female who  presents with malfunctioning left arm AV access.  The patient is scheduled for angiography with possible intervention of the AV access.  The patient is aware the risks include but are not limited to: bleeding, infection, thrombosis of the cannulated access, and possible anaphylactic reaction to the contrast.  The patient acknowledges if the access can not be salvaged a tunneled catheter will be needed and will be placed during this procedure.  The patient is aware of the risks of the procedure and elects to proceed with the angiogram and intervention.  DESCRIPTION: After full informed written consent was obtained, the patient was brought back to the Special Procedure suite and placed supine position.  Appropriate cardiopulmonary monitors were placed.  The left arm was prepped and draped in the standard fashion.  Appropriate timeout is called. The left brachiocephalic fistula was cannulated with a micropuncture needle.  Cannulation was performed with ultrasound guidance. Ultrasound was placed in a sterile sleeve, the AV access was  interrogated and noted to be echolucent and compressible indicating patency. Image was recorded for the permanent record. The puncture is performed under continuous ultrasound visualization.   The microwire was advanced and the needle was exchanged for  a microsheath.  The J-wire was then advanced and a 6 Fr sheath inserted.  Hand injections were completed to image the access from the arterial anastomosis through the entire access.  The central venous structures were also imaged by hand injections.  Based on the images, there were 2 greater than 70% strictures noted, the first was above the anastomosis just before the area being utilized for arterial cannulation and the second was just beyond the area for venous cannulation.  Central veins were all widely patent.  4000 units of heparin was given and a wire was negotiated through the 2 strictures within the venous portion of the graft.  An 6 mm x 60 mm Lutonix drug-eluting balloon was used to treat each lesion (2 balloons were used).  Inflation was to 12 atm for 1 minute.  Next a 8 mm x 40 mm Ultraverse balloon was used to treat both lesions the inflation on the lesion closest to the sheath was 12 atm for 1 minute.  The inflation on the lesion that was more in the midportion of the graft was to 18 atm at which point the balloon ruptured not having fully treated this segment.  Therefore an 8 mm Dorado balloon was utilized to treat this  mid lesion inflation was to 18 atm for 1 minute.  I also treated the lesion closer to the anastomosis with the 8 mm balloon but there is no significant wasting.  At which point I elected to treat this lesion with a 10 mm x 40 mm balloon inflated to 12 atm for 1 minute  Follow-up imaging demonstrates complete resolution of the stricture with rapid flow of contrast through the graft, the central venous anatomy is preserved.  A 4-0 Monocryl purse-string suture was sewn around the sheath.  The sheath was removed and light pressure was  applied.  A sterile bandage was applied to the puncture site.    COMPLICATIONS: None  CONDITION: Sharon Arias, M.D [facility] Office: [phone]  11/02/2022 4:32 PM

## 2022-11-30 ENCOUNTER — Ambulatory Visit
Admission: RE | Admit: 2022-11-30 | Discharge: 2022-11-30 | Disposition: A | Discharge status: Home / Self Care | Payer: Medicare Other | Source: Ambulatory Visit | Attending: Internal Medicine | Admitting: Internal Medicine

## 2022-11-30 DIAGNOSIS — Z1231 Encounter for screening mammogram for malignant neoplasm of breast: Secondary | ICD-10-CM | POA: Diagnosis not present

## 2022-12-02 ENCOUNTER — Ambulatory Visit (INDEPENDENT_AMBULATORY_CARE_PROVIDER_SITE_OTHER): Payer: Medicare Other | Admitting: Nurse Practitioner

## 2022-12-02 ENCOUNTER — Encounter (INDEPENDENT_AMBULATORY_CARE_PROVIDER_SITE_OTHER): Payer: Medicare Other

## 2022-12-16 ENCOUNTER — Encounter (INDEPENDENT_AMBULATORY_CARE_PROVIDER_SITE_OTHER): Payer: Self-pay | Admitting: Vascular Surgery

## 2022-12-16 ENCOUNTER — Ambulatory Visit (INDEPENDENT_AMBULATORY_CARE_PROVIDER_SITE_OTHER): Payer: Medicare Other

## 2022-12-16 ENCOUNTER — Other Ambulatory Visit (INDEPENDENT_AMBULATORY_CARE_PROVIDER_SITE_OTHER): Payer: Self-pay | Admitting: Vascular Surgery

## 2022-12-16 ENCOUNTER — Ambulatory Visit (INDEPENDENT_AMBULATORY_CARE_PROVIDER_SITE_OTHER): Payer: Medicare Other | Admitting: Nurse Practitioner

## 2022-12-16 VITALS — BP 135/57 | HR 62 | Ht 67.0 in

## 2022-12-16 DIAGNOSIS — N186 End stage renal disease: Secondary | ICD-10-CM | POA: Diagnosis not present

## 2022-12-16 DIAGNOSIS — E1121 Type 2 diabetes mellitus with diabetic nephropathy: Secondary | ICD-10-CM

## 2022-12-16 DIAGNOSIS — I10 Essential (primary) hypertension: Secondary | ICD-10-CM | POA: Diagnosis not present

## 2022-12-16 DIAGNOSIS — Z9862 Peripheral vascular angioplasty status: Secondary | ICD-10-CM | POA: Diagnosis not present

## 2022-12-16 NOTE — Progress Notes (Signed)
Subjective:    Patient ID: Sharon Arias, female    DOB: 01/20/1961, 61 y.o.   MRN: [id number] Chief Complaint  Patient presents with   Follow-up    HDA access.    The patient returns to the office for followup status post intervention of the dialysis access on 11/02/2022 of her left brachiocephalic AV fistula.   Following the intervention the excess function was unchanged per the patient.  She does not have any significant issues during dialysis.  The patient denies an increase in arm swelling. At the present time the patient denies hand pain.   Duplex ultrasound of the AV access shows a patent access.  The previously noted stenosis is not improved compared to last study.  Flow volume today is 703 cc/min (previous flow volume was 1841 cc/min)    Review of Systems  All other systems reviewed and are negative.      Objective:   Physical Exam Vitals reviewed.  HENT:     Head: Normocephalic.  Cardiovascular:     Rate and Rhythm: Normal rate.     Pulses:          Radial pulses are 1+ on the right side and 1+ on the left side.     Arteriovenous access: Left arteriovenous access is present.    Comments: Strong thrill distally softer more proximally.  Good bruit throughout Pulmonary:     Effort: Pulmonary effort is normal.  Skin:    General: Skin is warm and dry.  Neurological:     Mental Status: She is alert and oriented to person, place, and time.  Psychiatric:        Mood and Affect: Mood normal.        Behavior: Behavior normal.        Thought Content: Thought content normal.        Judgment: Judgment normal.     BP (!) 135/57 (BP Location: Right Arm, Patient Position: Sitting, Cuff Size: Large)   Pulse 62   Ht '5\' 7"'$  (1.702 m)   BMI 39.16 kg/m   Past Medical History:  Diagnosis Date   Anemia in chronic kidney disease 05/19/2016   Anxiety    Aortic atherosclerosis (HCC)    Blind    CAD (coronary artery disease)    Cardiac murmur    CHF (congestive heart  failure) ([place])    G2DD on 02/2020 TTE   Chicken pox    Chronic anticoagulation    CVA (cerebral vascular accident) ([place])    DDD (degenerative disc disease), lumbar    Detached retina    Diabetic neuropathy ([place])    Diabetic retinopathy ([place])    legally blind   Dyspnea    ESRD needing dialysis ([place])    Hx of CABG    Hyperlipidemia    Hypertension    NSTEMI (non-ST elevated myocardial infarction) ([place]) 06/2012   Obesity    Pneumonia    PONV (postoperative nausea and vomiting)    Sciatica of right side    Sleep apnea    CPAP NIGHTLY   T2DM (type 2 diabetes mellitus) ([place])     Social History   Socioeconomic History   Marital status: Married    Spouse name: Moana Munford   Number of children: 3   Years of education: Not on file   Highest education level: Not on file  Occupational History   Not on file  Tobacco Use   Smoking status: Never    Passive exposure: Never  Smokeless tobacco: Never  Vaping Use   Vaping Use: Never used  Substance and Sexual Activity   Alcohol use: No   Drug use: No   Sexual activity: Not Currently  Other Topics Concern   Not on file  Social History Narrative   Lives at home with spouse    Social Determinants of Health   Financial Resource Strain: Not on file  Food Insecurity: Not on file  Transportation Needs: Not on file  Physical Activity: Not on file  Stress: Not on file  Social Connections: Not on file  Intimate Partner Violence: Not on file    Past Surgical History:  Procedure Laterality Date   A/V FISTULAGRAM Left 04/21/2021   Procedure: A/V FISTULAGRAM;  Surgeon: Katha Cabal, MD;  Location: [facility];  Service: Cardiovascular;  Laterality: Left;   A/V FISTULAGRAM Left 11/02/2022   Procedure: A/V Fistulagram;  Surgeon: Katha Cabal, MD;  Location: [facility];  Service: Cardiovascular;  Laterality: Left;   AV FISTULA PLACEMENT Left 02/06/2021   Procedure: ARTERIOVENOUS (AV) FISTULA CREATION  (BRACHIAL CEPHALIC );  Surgeon: Katha Cabal, MD;  Location: [facility];  Service: Vascular;  Laterality: Left;   COLONOSCOPY WITH PROPOFOL N/A 01/26/2016   Procedure: COLONOSCOPY WITH PROPOFOL;  Surgeon: Lollie Sails, MD;  Location: [facility] ENDOSCOPY;  Service: Endoscopy;  Laterality: N/A;   COLONOSCOPY WITH PROPOFOL N/A 01/27/2016   Procedure: COLONOSCOPY WITH PROPOFOL;  Surgeon: Lollie Sails, MD;  Location: [facility] ENDOSCOPY;  Service: Endoscopy;  Laterality: N/A;   CORONARY ARTERY BYPASS GRAFT  2013   DIALYSIS/PERMA CATHETER INSERTION N/A 02/03/2021   Procedure: DIALYSIS/PERMA CATHETER INSERTION;  Surgeon: Katha Cabal, MD;  Location: [facility];  Service: Cardiovascular;  Laterality: N/A;   DIALYSIS/PERMA CATHETER INSERTION N/A 06/16/2021   Procedure: DIALYSIS/PERMA CATHETER INSERTION;  Surgeon: Algernon Huxley, MD;  Location: [facility];  Service: Cardiovascular;  Laterality: N/A;   DIALYSIS/PERMA CATHETER REMOVAL N/A 09/22/2021   Procedure: DIALYSIS/PERMA CATHETER REMOVAL;  Surgeon: Katha Cabal, MD;  Location: [facility];  Service: Cardiovascular;  Laterality: N/A;   INCISION AND DRAINAGE     chest abscess   INCISION AND DRAINAGE ABSCESS N/A 12/11/2018   Procedure: INCISION AND DRAINAGE PERINEAL;  Surgeon: Jules Husbands, MD;  Location: [facility];  Service: General;  Laterality: N/A;   RETINAL LASER PROCEDURE      Family History  Problem Relation Age of Onset   Breast cancer Mother    Prostate cancer Father        we think mets to liver and bone    Allergies  Allergen Reactions   Ozempic (0.25 Or 0.5 Mg-Dose) [Semaglutide(0.25 Or 0.'5mg'$ -Dos)] Diarrhea and Nausea Only   Trulicity [Dulaglutide] Diarrhea and Nausea Only   Other     Anesthesia--nausea/vomiting       Latest Ref Rng & Units 02/06/2021    6:52 AM 02/04/2021   12:17 PM 12/26/2020    1:16 PM  CBC  WBC 4.0 - 10.5 K/uL  10.9  10.0   Hemoglobin 12.0 - 15.0 g/dL 9.2  9.0  9.5    Hematocrit 36.0 - 46.0 % 27.0  27.5  28.3   Platelets 150 - 400 K/uL  239  234       CMP     Component Value Date/Time   NA 143 02/06/2021 0652   K 4.1 02/06/2021 0652   CL 114 (H) 02/06/2021 0652   CO2 18 (  L) 02/04/2021 1217   GLUCOSE 207 (H) 02/06/2021 0652   BUN 75 (H) 02/06/2021 0652   CREATININE 4.40 (H) 02/06/2021 0652   CALCIUM 9.3 02/04/2021 1217   PROT 7.2 05/24/2020 0520   ALBUMIN 3.3 (L) 05/24/2020 0520   AST 17 05/24/2020 0520   ALT 29 05/24/2020 0520   ALKPHOS 68 05/24/2020 0520   BILITOT 0.6 05/24/2020 0520   GFRNONAA 14 (L) 02/04/2021 1217   GFRAA 19 (L) 09/12/2020 1331     No results found.     Assessment & Plan:   1. ESRD (end stage renal disease) ([place]) Following recent intervention the patient's studies have not drastically improved.  However the patient has not yet experiencing any issues with dialysis.  Given that she is not currently having any issues with dialysis and she just recently underwent intervention without drastic change we will continue to have the patient continue with dialysis and monitor closely.  Will have her return in 3 months.  She is advised however that she begins to have significant issues or stenoses she should contact her office and we will arrange for her to undergo another fistulogram.  2. Primary hypertension Continue antihypertensive medications as already ordered, these medications have been reviewed and there are no changes at this time.  3. Type 2 diabetes mellitus with diabetic nephropathy, without long-term current use of insulin (HCC) Continue hypoglycemic medications as already ordered, these medications have been reviewed and there are no changes at this time.  Hgb A1C to be monitored as already arranged by primary service   Current Outpatient Medications on File Prior to Visit  Medication Sig Dispense Refill   allopurinol (ZYLOPRIM) 300 MG tablet Take 300 mg by mouth in the morning.     amLODipine (NORVASC) 10  MG tablet Take 10 mg by mouth at bedtime.     aspirin EC 81 MG tablet Take 81 mg by mouth in the morning.     clopidogrel (PLAVIX) 75 MG tablet Take 1 tablet (75 mg total) by mouth daily. (Patient taking differently: Take 75 mg by mouth in the morning.) 30 tablet 0   hydrALAZINE (APRESOLINE) 50 MG tablet Take 50 mg by mouth in the morning and at bedtime.     Insulin Degludec (TRESIBA) 100 UNIT/ML SOLN Inject 75 mg into the skin at bedtime.     insulin lispro (HUMALOG) 100 UNIT/ML KwikPen Inject 15-40 Units into the skin See admin instructions. Pt. Uses 15 units/ml in the mornings, 30 units/ml in the lunch, and 40 units/ml at [employer] Sliding scale     lidocaine-prilocaine (EMLA) cream Apply 1 application topically as needed (prior to dialysis).     losartan (COZAAR) 100 MG tablet Take 100 mg by mouth in the morning.     metoprolol tartrate (LOPRESSOR) 50 MG tablet Take 50 mg by mouth 2 (two) times daily.      multivitamin (RENA-VIT) TABS tablet Take 1 tablet by mouth at bedtime.     ONETOUCH VERIO test strip      rosuvastatin (CRESTOR) 20 MG tablet Take 40 mg by mouth at bedtime.     Current Facility-Administered Medications on File Prior to Visit  Medication Dose Route Frequency Provider Last Rate Last Admin   Chlorhexidine Gluconate Cloth 2 % PADS 6 each  6 each Topical Q0600 Kris Hartmann, NP        There are no Patient Instructions on file for this visit. No follow-ups on file.   Kris Hartmann, NP

## 2023-03-11 ENCOUNTER — Other Ambulatory Visit (INDEPENDENT_AMBULATORY_CARE_PROVIDER_SITE_OTHER): Payer: Self-pay | Admitting: Nurse Practitioner

## 2023-03-11 DIAGNOSIS — N186 End stage renal disease: Secondary | ICD-10-CM

## 2023-03-16 NOTE — Progress Notes (Deleted)
MRN : [id number]  Sharon Arias is a 61 y.o. (July 12, 1961) female who presents with chief complaint of check access.  History of Present Illness:  The patient returns to the office for followup status post intervention of their dialysis access 11/02/2022.   Procedure: Percutaneous transluminal angioplasty peripheral segment in 2 locations  Following the intervention the access function has significantly improved, with better flow rates and improved KT/V. The patient has not been experiencing increased bleeding times following decannulation and the patient denies increased recirculation. The patient denies an increase in arm swelling. At the present time the patient denies hand pain.  No recent shortening of the patient's walking distance or new symptoms consistent with claudication.  No history of rest pain symptoms. No new ulcers or wounds of the lower extremities have occurred.  The patient denies amaurosis fugax or recent TIA symptoms. There are no recent neurological changes noted. There is no history of DVT, PE or superficial thrombophlebitis. No recent episodes of angina or shortness of breath documented.   Duplex ultrasound of the AV access shows a patent access.  The previously noted stenosis is improved compared to last study.  Flow volume today is *** cc/min (previous flow volume was *** cc/min)      No outpatient medications have been marked as taking for the 03/17/23 encounter (Appointment) with Delana Meyer, Dolores Lory, MD.   Current Facility-Administered Medications for the 03/17/23 encounter (Appointment) with Delana Meyer, Dolores Lory, MD  Medication   Chlorhexidine Gluconate Cloth 2 % PADS 6 each    Past Medical History:  Diagnosis Date   Anemia in chronic kidney disease 05/19/2016   Anxiety    Aortic atherosclerosis ([place])    Blind    CAD (coronary artery disease)    Cardiac murmur    CHF (congestive heart failure) ([place])    G2DD on 02/2020 TTE    Chicken pox    Chronic anticoagulation    CVA (cerebral vascular accident) ([place])    DDD (degenerative disc disease), lumbar    Detached retina    Diabetic neuropathy ([place])    Diabetic retinopathy ([place])    legally blind   Dyspnea    ESRD needing dialysis ([place])    Hx of CABG    Hyperlipidemia    Hypertension    NSTEMI (non-ST elevated myocardial infarction) ([place]) 06/2012   Obesity    Pneumonia    PONV (postoperative nausea and vomiting)    Sciatica of right side    Sleep apnea    CPAP NIGHTLY   T2DM (type 2 diabetes mellitus) ([place])     Past Surgical History:  Procedure Laterality Date   A/V FISTULAGRAM Left 04/21/2021   Procedure: A/V FISTULAGRAM;  Surgeon: Katha Cabal, MD;  Location: [facility];  Service: Cardiovascular;  Laterality: Left;   A/V FISTULAGRAM Left 11/02/2022   Procedure: A/V Fistulagram;  Surgeon: Katha Cabal, MD;  Location: [facility];  Service: Cardiovascular;  Laterality: Left;   AV FISTULA PLACEMENT Left 02/06/2021   Procedure: ARTERIOVENOUS (AV) FISTULA CREATION (BRACHIAL CEPHALIC );  Surgeon: Katha Cabal, MD;  Location: [facility];  Service: Vascular;  Laterality: Left;   COLONOSCOPY WITH PROPOFOL N/A 01/26/2016   Procedure: COLONOSCOPY WITH PROPOFOL;  Surgeon: Lollie Sails, MD;  Location: [facility] ENDOSCOPY;  Service: Endoscopy;  Laterality: N/A;   COLONOSCOPY WITH PROPOFOL N/A  01/27/2016   Procedure: COLONOSCOPY WITH PROPOFOL;  Surgeon: Lollie Sails, MD;  Location: [facility] ENDOSCOPY;  Service: Endoscopy;  Laterality: N/A;   CORONARY ARTERY BYPASS GRAFT  2013   DIALYSIS/PERMA CATHETER INSERTION N/A 02/03/2021   Procedure: DIALYSIS/PERMA CATHETER INSERTION;  Surgeon: Katha Cabal, MD;  Location: [facility];  Service: Cardiovascular;  Laterality: N/A;   DIALYSIS/PERMA CATHETER INSERTION N/A 06/16/2021   Procedure: DIALYSIS/PERMA CATHETER INSERTION;  Surgeon: Algernon Huxley, MD;  Location: [facility];   Service: Cardiovascular;  Laterality: N/A;   DIALYSIS/PERMA CATHETER REMOVAL N/A 09/22/2021   Procedure: DIALYSIS/PERMA CATHETER REMOVAL;  Surgeon: Katha Cabal, MD;  Location: [facility] [place] [facility];  Service: Cardiovascular;  Laterality: N/A;   INCISION AND DRAINAGE     chest abscess   INCISION AND DRAINAGE ABSCESS N/A 12/11/2018   Procedure: INCISION AND DRAINAGE PERINEAL;  Surgeon: Jules Husbands, MD;  Location: [facility];  Service: General;  Laterality: N/A;   RETINAL LASER PROCEDURE      Social History Social History   Tobacco Use   Smoking status: Never    Passive exposure: Never   Smokeless tobacco: Never  Vaping Use   Vaping Use: Never used  Substance Use Topics   Alcohol use: No   Drug use: No    Family History Family History  Problem Relation Age of Onset   Breast cancer Mother    Prostate cancer Father        we think mets to liver and bone    Allergies  Allergen Reactions   Ozempic (0.25 Or 0.5 Mg-Dose) [Semaglutide(0.25 Or 0.5mg -Dos)] Diarrhea and Nausea Only   Trulicity [Dulaglutide] Diarrhea and Nausea Only   Other     Anesthesia--nausea/vomiting     REVIEW OF SYSTEMS (Negative unless checked)  Constitutional: [] Weight loss  [] Fever  [] Chills Cardiac: [] Chest pain   [] Chest pressure   [] Palpitations   [] Shortness of breath when laying flat   [] Shortness of breath with exertion. Vascular:  [] Pain in legs with walking   [] Pain in legs at rest  [] History of DVT   [] Phlebitis   [] Swelling in legs   [] Varicose veins   [] Non-healing ulcers Pulmonary:   [] Uses home oxygen   [] Productive cough   [] Hemoptysis   [] Wheeze  [] COPD   [] Asthma Neurologic:  [] Dizziness   [] Seizures   [] History of stroke   [] History of TIA  [] Aphasia   [] Vissual changes   [] Weakness or numbness in arm   [] Weakness or numbness in leg Musculoskeletal:   [] Joint swelling   [] Joint pain   [] Low back pain Hematologic:  [] Easy bruising  [] Easy bleeding   [] Hypercoagulable state    [] Anemic Gastrointestinal:  [] Diarrhea   [] Vomiting  [] Gastroesophageal reflux/heartburn   [] Difficulty swallowing. Genitourinary:  [x] Chronic kidney disease   [] Difficult urination  [] Frequent urination   [] Blood in urine Skin:  [] Rashes   [] Ulcers  Psychological:  [] History of anxiety   []  History of major depression.  Physical Examination  There were no vitals filed for this visit. There is no height or weight on file to calculate BMI. Gen: WD/WN, NAD Head: [place]/AT, No temporalis wasting.  Ear/Nose/Throat: Hearing grossly intact, nares w/o erythema or drainage Eyes: PER, EOMI, sclera nonicteric.  Neck: Supple, no gross masses or lesions.  No JVD.  Pulmonary:  Good air movement, no audible wheezing, no use of accessory muscles.  Cardiac: RRR, precordium non-hyperdynamic. Vascular:   *** Vessel Right Left  Radial Palpable Palpable  Brachial  Palpable Palpable  Gastrointestinal: soft, non-distended. No guarding/no peritoneal signs.  Musculoskeletal: M/S 5/5 throughout.  No deformity.  Neurologic: CN 2-12 intact. Pain and light touch intact in extremities.  Symmetrical.  Speech is fluent. Motor exam as listed above. Psychiatric: Judgment intact, Mood & affect appropriate for pt's clinical situation. Dermatologic: No rashes or ulcers noted.  No changes consistent with cellulitis.   CBC Lab Results  Component Value Date   WBC 10.9 (H) 02/04/2021   HGB 9.2 (L) 02/06/2021   HCT 27.0 (L) 02/06/2021   MCV 92.3 02/04/2021   PLT 239 02/04/2021    BMET    Component Value Date/Time   NA 143 02/06/2021 0652   K 4.1 02/06/2021 0652   CL 114 (H) 02/06/2021 0652   CO2 18 (L) 02/04/2021 1217   GLUCOSE 207 (H) 02/06/2021 0652   BUN 75 (H) 02/06/2021 0652   CREATININE 4.40 (H) 02/06/2021 0652   CALCIUM 9.3 02/04/2021 1217   GFRNONAA 14 (L) 02/04/2021 1217   GFRAA 19 (L) 09/12/2020 1331   CrCl cannot be calculated (Patient's most recent lab result is older than the maximum 21 days  allowed.).  COAG Lab Results  Component Value Date   INR 1.1 02/04/2021   INR 1.2 03/18/2020    Radiology No results found.   Assessment/Plan There are no diagnoses linked to this encounter.   [name], MD  03/16/2023 8:31 AM

## 2023-03-17 ENCOUNTER — Encounter (INDEPENDENT_AMBULATORY_CARE_PROVIDER_SITE_OTHER): Payer: Medicare Other

## 2023-03-17 ENCOUNTER — Ambulatory Visit (INDEPENDENT_AMBULATORY_CARE_PROVIDER_SITE_OTHER): Payer: Medicare Other | Admitting: Vascular Surgery

## 2023-03-17 DIAGNOSIS — N186 End stage renal disease: Secondary | ICD-10-CM

## 2023-03-17 DIAGNOSIS — E1121 Type 2 diabetes mellitus with diabetic nephropathy: Secondary | ICD-10-CM

## 2023-03-17 DIAGNOSIS — E785 Hyperlipidemia, unspecified: Secondary | ICD-10-CM

## 2023-03-17 DIAGNOSIS — I2510 Atherosclerotic heart disease of native coronary artery without angina pectoris: Secondary | ICD-10-CM

## 2023-03-17 DIAGNOSIS — I10 Essential (primary) hypertension: Secondary | ICD-10-CM

## 2023-03-26 ENCOUNTER — Emergency Department: Payer: Medicare Other

## 2023-03-26 ENCOUNTER — Other Ambulatory Visit: Payer: Self-pay

## 2023-03-26 ENCOUNTER — Emergency Department
Admission: EM | Admit: 2023-03-26 | Discharge: 2023-03-26 | Disposition: A | Discharge status: Home / Self Care | Payer: Medicare Other | Attending: Emergency Medicine | Admitting: Emergency Medicine

## 2023-03-26 DIAGNOSIS — H8110 Benign paroxysmal vertigo, unspecified ear: Secondary | ICD-10-CM

## 2023-03-26 DIAGNOSIS — N186 End stage renal disease: Secondary | ICD-10-CM | POA: Diagnosis not present

## 2023-03-26 DIAGNOSIS — Z992 Dependence on renal dialysis: Secondary | ICD-10-CM | POA: Diagnosis not present

## 2023-03-26 DIAGNOSIS — R112 Nausea with vomiting, unspecified: Secondary | ICD-10-CM | POA: Diagnosis not present

## 2023-03-26 DIAGNOSIS — R42 Dizziness and giddiness: Secondary | ICD-10-CM | POA: Diagnosis present

## 2023-03-26 LAB — BASIC METABOLIC PANEL
Anion gap: 12 (ref 5–15)
BUN: 34 mg/dL — ABNORMAL HIGH (ref 8–23)
CO2: 24 mmol/L (ref 22–32)
Calcium: 9.9 mg/dL (ref 8.9–10.3)
Chloride: 103 mmol/L (ref 98–111)
Creatinine, Ser: 3.45 mg/dL — ABNORMAL HIGH (ref 0.44–1.00)
GFR, Estimated: 15 mL/min — ABNORMAL LOW (ref >60)
Glucose, Bld: 216 mg/dL — ABNORMAL HIGH (ref 70–99)
Potassium: 3.9 mmol/L (ref 3.5–5.1)
Sodium: 139 mmol/L (ref 135–145)

## 2023-03-26 LAB — CBC
HCT: 36.3 % (ref 36.0–46.0)
Hemoglobin: 12 g/dL (ref 12.0–15.0)
MCH: 30.7 pg (ref 26.0–34.0)
MCHC: 33.1 g/dL (ref 30.0–36.0)
MCV: 92.8 fL (ref 80.0–100.0)
Platelets: 249 10*3/uL (ref 150–400)
RBC: 3.91 MIL/uL (ref 3.87–5.11)
RDW: 13.3 % (ref 11.5–15.5)
WBC: 9.5 10*3/uL (ref 4.0–10.5)
nRBC: 0 % (ref 0.0–0.2)

## 2023-03-26 MED ORDER — MECLIZINE HCL 25 MG PO TABS
25.0000 mg | ORAL_TABLET | Freq: Once | ORAL | Status: AC
  Administered 2023-03-26: 25 mg via ORAL
  Filled 2023-03-26: qty 1

## 2023-03-26 MED ORDER — MECLIZINE HCL 25 MG PO TABS: 25.0000 mg | ORAL_TABLET | Freq: Three times a day (TID) | ORAL | 0 refills | Status: DC | PRN

## 2023-03-26 MED ORDER — ONDANSETRON 4 MG PO TBDP
4.0000 mg | ORAL_TABLET | Freq: Once | ORAL | Status: AC
  Administered 2023-03-26: 4 mg via ORAL
  Filled 2023-03-26: qty 1

## 2023-03-26 MED ORDER — ONDANSETRON 8 MG PO TBDP: 8.0000 mg | ORAL_TABLET | Freq: Three times a day (TID) | ORAL | 0 refills | Status: DC | PRN

## 2023-03-26 NOTE — ED Triage Notes (Addendum)
Pt to ED EMS for dizziness x3 days. +nausea. Reports motion sickness. Denies headache. States has had this problem before with "ear issues"

## 2023-03-26 NOTE — ED Triage Notes (Signed)
First nurse note: Presents via EMS from home, nausea and dizziness x3 days. CBG 273 per EMS. Reports M/W/F HD schedule with no recently missed sessions.

## 2023-03-26 NOTE — ED Provider Notes (Signed)
[facility] Provider Note   Event Date/Time   First MD Initiated Contact with Patient 03/26/23 1105     (approximate) History  Dizziness  HPI Sharon Arias is a 62 y.o. female with a stated past medical history of ESRD on dialysis M/W/F who presents complaining of worsening positional vertigo over the last 3 days.  Patient states that she has had symptoms similar to this in the past that have been resolved with an Epley maneuver and ENTs office.  Patient states that he has been prescribed meclizine in the past that she states has worked for this similarly however she feels rather nauseous at this point and is more concerned with the sensation that she is going to vomit.  Patient denies any headache, recent travel, sick contacts, or history of clotting. ROS: Patient currently denies any vision changes, tinnitus, difficulty speaking, facial droop, sore throat, chest pain, shortness of breath, abdominal pain, diarrhea, dysuria, or weakness/numbness/paresthesias in any extremity   Physical Exam  Triage Vital Signs: ED Triage Vitals  Enc Vitals Group     BP 03/26/23 1033 (!) 152/61     Pulse Rate 03/26/23 1033 63     Resp 03/26/23 1033 18     Temp 03/26/23 1035 98.9 F (37.2 C)     Temp src --      SpO2 03/26/23 1033 94 %     Weight 03/26/23 1034 250 lb (113.4 kg)     Height 03/26/23 1034 5' 7.5" (1.715 m)     Head Circumference --      Peak Flow --      Pain Score 03/26/23 1034 0     Pain Loc --      Pain Edu? --      Excl. in GC? --    Most recent vital signs: Vitals:   03/26/23 1033 03/26/23 1035  BP: (!) 152/61   Pulse: 63   Resp: 18   Temp:  98.9 F (37.2 C)  SpO2: 94%    General: Awake, oriented x4. CV:  Good peripheral perfusion.  Resp:  Normal effort.  Abd:  No distention.  Other:  Middle-aged obese Caucasian female laying in bed in mild distress secondary to nausea.  [place] exam peripheral ED Results / Procedures / Treatments  Labs (all  labs ordered are listed, but only abnormal results are displayed) Labs Reviewed  BASIC METABOLIC PANEL - Abnormal; Notable for the following components:      Result Value   Glucose, Bld 216 (*)    BUN 34 (*)    Creatinine, Ser 3.45 (*)    GFR, Estimated 15 (*)    All other components within normal limits  CBC  URINALYSIS, ROUTINE W REFLEX MICROSCOPIC   EKG ED ECG REPORT I, Merwyn Katos, the attending physician, personally viewed and interpreted this ECG. Date: 03/26/2023 EKG Time: 1040 Rate: 62 Rhythm: normal sinus rhythm QRS Axis: normal Intervals: normal ST/T Wave abnormalities: normal Narrative Interpretation: no evidence of acute ischemia RADIOLOGY ED MD interpretation: CT of the head without contrast interpreted by me shows no evidence of acute abnormalities including no intracerebral hemorrhage, obvious masses, or significant edema -Agree with radiology assessment Official radiology report(s): CT Head Wo Contrast  Result Date: 03/26/2023 CLINICAL DATA:  Provided history: Neuro deficit, acute, stroke suspected. EXAM: CT HEAD WITHOUT CONTRAST TECHNIQUE: Contiguous axial images were obtained from the base of the skull through the vertex without intravenous contrast. RADIATION DOSE REDUCTION: This exam was performed  according to the departmental dose-optimization program which includes automated exposure control, adjustment of the mA and/or kV according to patient size and/or use of iterative reconstruction technique. COMPARISON:  MRI brain 03/18/2020. Head CT 03/18/2020. FINDINGS: Brain: Mild-to-moderate generalized cerebral atrophy. Redemonstrated chronic cortical/subcortical infarct within the right parietal lobe (posterior right MCA territory). Background mild patchy and ill-defined hypoattenuation within the cerebral white matter, nonspecific but compatible with chronic small vessel disease. Known chronic infarcts within the left thalamus, within the pons and within the  cerebellum, some of which were better appreciated on the prior brain MRI of 03/18/2020. There is no acute intracranial hemorrhage. No acute demarcated cortical infarct. No extra-axial fluid collection. No evidence of an intracranial mass. No midline shift. Vascular: No hyperdense vessel. Atherosclerotic calcifications. Skull: No fracture or aggressive osseous lesion. Sinuses/Orbits: No mass or acute finding within the imaged orbits. No significant paranasal sinus disease at the imaged levels. IMPRESSION: 1.  No evidence of an acute intracranial abnormality. 2. Parenchymal atrophy, chronic small vessel ischemic disease and chronic infarcts as described. Electronically Signed   By: Jackey Loge D.O.   On: 03/26/2023 12:01   PROCEDURES: Critical Care performed: No .1-3 Lead EKG Interpretation  Performed by: Merwyn Katos, MD Authorized by: Merwyn Katos, MD     Interpretation: normal     ECG rate:  71   ECG rate assessment: normal     Rhythm: sinus rhythm     Ectopy: none     Conduction: normal    MEDICATIONS ORDERED IN ED: Medications  ondansetron (ZOFRAN-ODT) disintegrating tablet 4 mg (4 mg Oral Given 03/26/23 1211)  meclizine (ANTIVERT) tablet 25 mg (25 mg Oral Given 03/26/23 1209)   IMPRESSION / MDM / ASSESSMENT AND PLAN / ED COURSE  I reviewed the triage vital signs and the nursing notes.                             The patient is on the cardiac monitor to evaluate for evidence of arrhythmia and/or significant heart rate changes. Patient's presentation is most consistent with acute presentation with potential threat to life or bodily function. Based on History, Exam, and Findings, presentation not consistent with syncope, seizure, stroke, meningitis, symptomatic anemia (gastrointestinal bleed), Increased ICP (cerebral tumor/mass), ICH. Additionally, I have a low suspicion for AOM, labyrinthitis, or other infectious process. Tx: meclizine Reassessment: Prior to discharge symptoms  controlled, patient well appearing. Disposition:  Discharge. Strict return precautions discussed w/ full understanding. Advise follow up with primary care provider within 24-48 hours.   FINAL CLINICAL IMPRESSION(S) / ED DIAGNOSES   Final diagnoses:  Benign paroxysmal positional vertigo, unspecified laterality  Nausea and vomiting, unspecified vomiting type   Rx / DC Orders   ED Discharge Orders     None      Note:  This document was prepared using Dragon voice recognition software and may include unintentional dictation errors.   Merwyn Katos, MD 03/26/23 1228

## 2023-04-26 ENCOUNTER — Ambulatory Visit (INDEPENDENT_AMBULATORY_CARE_PROVIDER_SITE_OTHER): Payer: Medicare Other | Admitting: Nurse Practitioner

## 2023-04-26 ENCOUNTER — Encounter (INDEPENDENT_AMBULATORY_CARE_PROVIDER_SITE_OTHER): Payer: Self-pay | Admitting: Nurse Practitioner

## 2023-04-26 ENCOUNTER — Ambulatory Visit (INDEPENDENT_AMBULATORY_CARE_PROVIDER_SITE_OTHER): Payer: Medicare Other

## 2023-04-26 VITALS — BP 155/59 | HR 62 | Resp 16 | Wt 255.4 lb

## 2023-04-26 DIAGNOSIS — N186 End stage renal disease: Secondary | ICD-10-CM | POA: Diagnosis not present

## 2023-04-26 DIAGNOSIS — E1121 Type 2 diabetes mellitus with diabetic nephropathy: Secondary | ICD-10-CM | POA: Diagnosis not present

## 2023-04-26 DIAGNOSIS — I10 Essential (primary) hypertension: Secondary | ICD-10-CM | POA: Diagnosis not present

## 2023-04-26 NOTE — Progress Notes (Signed)
Subjective:    Patient ID: Sharon Arias, female    DOB: April 16, 1961, 62 y.o.   MRN: [id number] Chief Complaint  Patient presents with   Follow-up    Ultrasound follow up    The patient returns to the office for followup status post intervention of the dialysis access on 11/02/2022 of her left brachiocephalic AV fistula.   Following the intervention the excess function was unchanged per the patient.  She does not have any significant issues during dialysis.  The patient denies an increase in arm swelling. At the present time the patient denies hand pain.   Duplex ultrasound of the AV access shows a patent access.  The previously noted stenosis is not improved compared to last study, but not worsened.  Flow volume today is 978 cc/min (previous flow volume was 703 cc/min)    Review of Systems  All other systems reviewed and are negative.      Objective:   Physical Exam Vitals reviewed.  HENT:     Head: Normocephalic.  Cardiovascular:     Rate and Rhythm: Normal rate.     Pulses:          Radial pulses are 1+ on the right side and 1+ on the left side.     Arteriovenous access: Left arteriovenous access is present.    Comments: Strong thrill distally softer more proximally.  Good bruit throughout Pulmonary:     Effort: Pulmonary effort is normal.  Skin:    General: Skin is warm and dry.  Neurological:     Mental Status: She is alert and oriented to person, place, and time.  Psychiatric:        Mood and Affect: Mood normal.        Behavior: Behavior normal.        Thought Content: Thought content normal.        Judgment: Judgment normal.     BP (!) 155/59 (BP Location: Right Arm)   Pulse 62   Resp 16   Wt 255 lb 6.4 oz (115.8 kg)   BMI 39.41 kg/m   Past Medical History:  Diagnosis Date   Anemia in chronic kidney disease 05/19/2016   Anxiety    Aortic atherosclerosis (HCC)    Blind    CAD (coronary artery disease)    Cardiac murmur    CHF (congestive heart  failure) (HCC)    G2DD on 02/2020 TTE   Chicken pox    Chronic anticoagulation    CVA (cerebral vascular accident) (HCC)    DDD (degenerative disc disease), lumbar    Detached retina    Diabetic neuropathy (HCC)    Diabetic retinopathy (HCC)    legally blind   Dyspnea    ESRD needing dialysis (HCC)    Hx of CABG    Hyperlipidemia    Hypertension    NSTEMI (non-ST elevated myocardial infarction) (HCC) 06/2012   Obesity    Pneumonia    PONV (postoperative nausea and vomiting)    Sciatica of right side    Sleep apnea    CPAP NIGHTLY   T2DM (type 2 diabetes mellitus) (HCC)     Social History   Socioeconomic History   Marital status: Married    Spouse name: Fredrica Scheidel   Number of children: 3   Years of education: Not on file   Highest education level: Not on file  Occupational History   Not on file  Tobacco Use   Smoking status: Never  Passive exposure: Never   Smokeless tobacco: Never  Vaping Use   Vaping Use: Never used  Substance and Sexual Activity   Alcohol use: No   Drug use: No   Sexual activity: Not Currently  Other Topics Concern   Not on file  Social History Narrative   Lives at home with spouse    Social Determinants of Health   Financial Resource Strain: Not on file  Food Insecurity: Not on file  Transportation Needs: Not on file  Physical Activity: Not on file  Stress: Not on file  Social Connections: Not on file  Intimate Partner Violence: Not on file    Past Surgical History:  Procedure Laterality Date   A/V FISTULAGRAM Left 04/21/2021   Procedure: A/V FISTULAGRAM;  Surgeon: Renford Dills, MD;  Location: [facility];  Service: Cardiovascular;  Laterality: Left;   A/V FISTULAGRAM Left 11/02/2022   Procedure: A/V Fistulagram;  Surgeon: Renford Dills, MD;  Location: [facility];  Service: Cardiovascular;  Laterality: Left;   AV FISTULA PLACEMENT Left 02/06/2021   Procedure: ARTERIOVENOUS (AV) FISTULA CREATION  (BRACHIAL CEPHALIC );  Surgeon: Renford Dills, MD;  Location: [facility];  Service: Vascular;  Laterality: Left;   COLONOSCOPY WITH PROPOFOL N/A 01/26/2016   Procedure: COLONOSCOPY WITH PROPOFOL;  Surgeon: Christena Deem, MD;  Location: [facility] ENDOSCOPY;  Service: Endoscopy;  Laterality: N/A;   COLONOSCOPY WITH PROPOFOL N/A 01/27/2016   Procedure: COLONOSCOPY WITH PROPOFOL;  Surgeon: Christena Deem, MD;  Location: [facility] ENDOSCOPY;  Service: Endoscopy;  Laterality: N/A;   CORONARY ARTERY BYPASS GRAFT  2013   DIALYSIS/PERMA CATHETER INSERTION N/A 02/03/2021   Procedure: DIALYSIS/PERMA CATHETER INSERTION;  Surgeon: Renford Dills, MD;  Location: [facility];  Service: Cardiovascular;  Laterality: N/A;   DIALYSIS/PERMA CATHETER INSERTION N/A 06/16/2021   Procedure: DIALYSIS/PERMA CATHETER INSERTION;  Surgeon: Annice Needy, MD;  Location: [facility];  Service: Cardiovascular;  Laterality: N/A;   DIALYSIS/PERMA CATHETER REMOVAL N/A 09/22/2021   Procedure: DIALYSIS/PERMA CATHETER REMOVAL;  Surgeon: Renford Dills, MD;  Location: [facility];  Service: Cardiovascular;  Laterality: N/A;   INCISION AND DRAINAGE     chest abscess   INCISION AND DRAINAGE ABSCESS N/A 12/11/2018   Procedure: INCISION AND DRAINAGE PERINEAL;  Surgeon: Leafy Ro, MD;  Location: [facility];  Service: General;  Laterality: N/A;   RETINAL LASER PROCEDURE      Family History  Problem Relation Age of Onset   Breast cancer Mother    Prostate cancer Father        we think mets to liver and bone    Allergies  Allergen Reactions   Ozempic (0.25 Or 0.5 Mg-Dose) [Semaglutide(0.25 Or 0.5mg -Dos)] Diarrhea and Nausea Only   Trulicity [Dulaglutide] Diarrhea and Nausea Only   Other     Anesthesia--nausea/vomiting       Latest Ref Rng & Units 03/26/2023   10:35 AM 02/06/2021    6:52 AM 02/04/2021   12:17 PM  CBC  WBC 4.0 - 10.5 K/uL 9.5   10.9   Hemoglobin 12.0 - 15.0 g/dL 29.5  9.2  9.0    Hematocrit 36.0 - 46.0 % 36.3  27.0  27.5   Platelets 150 - 400 K/uL 249   239       CMP     Component Value Date/Time   NA 139 03/26/2023 1035   K 3.9 03/26/2023 1035   CL 103 03/26/2023 1035  CO2 24 03/26/2023 1035   GLUCOSE 216 (H) 03/26/2023 1035   BUN 34 (H) 03/26/2023 1035   CREATININE 3.45 (H) 03/26/2023 1035   CALCIUM 9.9 03/26/2023 1035   PROT 7.2 05/24/2020 0520   ALBUMIN 3.3 (L) 05/24/2020 0520   AST 17 05/24/2020 0520   ALT 29 05/24/2020 0520   ALKPHOS 68 05/24/2020 0520   BILITOT 0.6 05/24/2020 0520   GFRNONAA 15 (L) 03/26/2023 1035   GFRAA 19 (L) 09/12/2020 1331     No results found.     Assessment & Plan:   1. ESRD (end stage renal disease) (HCC) Recommend:  The patient is doing well and currently has adequate dialysis access. The patient's dialysis center is not reporting any access issues. Flow pattern is stable when compared to the prior ultrasound.  Given that it has not given her any issues, we will with her follow-up despite this stenosis.  She is advised that if she begins to have issues during dialysis to contact her office and we will plan on sooner follow-up.  The patient should have a duplex ultrasound of the dialysis access in 6 months. The patient will follow-up with me in the office after each ultrasound    2. Primary hypertension Continue antihypertensive medications as already ordered, these medications have been reviewed and there are no changes at this time.  3. Type 2 diabetes mellitus with diabetic nephropathy, without long-term current use of insulin (HCC) Continue hypoglycemic medications as already ordered, these medications have been reviewed and there are no changes at this time.  Hgb A1C to be monitored as already arranged by primary service   Current Outpatient Medications on File Prior to Visit  Medication Sig Dispense Refill   allopurinol (ZYLOPRIM) 300 MG tablet Take 300 mg by mouth in the morning.      amLODipine (NORVASC) 10 MG tablet Take 10 mg by mouth at bedtime.     aspirin EC 81 MG tablet Take 81 mg by mouth in the morning.     clopidogrel (PLAVIX) 75 MG tablet Take 1 tablet (75 mg total) by mouth daily. (Patient taking differently: Take 75 mg by mouth in the morning.) 30 tablet 0   hydrALAZINE (APRESOLINE) 50 MG tablet Take 50 mg by mouth in the morning and at bedtime.     Insulin Degludec (TRESIBA) 100 UNIT/ML SOLN Inject 75 mg into the skin at bedtime.     insulin lispro (HUMALOG) 100 UNIT/ML KwikPen Inject 15-40 Units into the skin See admin instructions. Pt. Uses 15 units/ml in the mornings, 30 units/ml in the lunch, and 40 units/ml at [employer] Sliding scale     lidocaine-prilocaine (EMLA) cream Apply 1 application topically as needed (prior to dialysis).     losartan (COZAAR) 100 MG tablet Take 100 mg by mouth in the morning.     metoprolol tartrate (LOPRESSOR) 50 MG tablet Take 50 mg by mouth 2 (two) times daily.      multivitamin (RENA-VIT) TABS tablet Take 1 tablet by mouth at bedtime.     ONETOUCH VERIO test strip      rosuvastatin (CRESTOR) 20 MG tablet Take 40 mg by mouth at bedtime.     meclizine (ANTIVERT) 25 MG tablet Take 1 tablet (25 mg total) by mouth 3 (three) times daily as needed for dizziness. 30 tablet 0   ondansetron (ZOFRAN-ODT) 8 MG disintegrating tablet Take 1 tablet (8 mg total) by mouth every 8 (eight) hours as needed for nausea or vomiting. 20 tablet 0  Current Facility-Administered Medications on File Prior to Visit  Medication Dose Route Frequency Provider Last Rate Last Admin   Chlorhexidine Gluconate Cloth 2 % PADS 6 each  6 each Topical Q0600 Georgiana Spinner, NP        There are no Patient Instructions on file for this visit. No follow-ups on file.   Georgiana Spinner, NP

## 2023-08-10 ENCOUNTER — Encounter: Payer: Self-pay | Admitting: Vascular Surgery

## 2023-10-21 ENCOUNTER — Other Ambulatory Visit: Payer: Self-pay | Admitting: Internal Medicine

## 2023-10-21 DIAGNOSIS — Z1231 Encounter for screening mammogram for malignant neoplasm of breast: Secondary | ICD-10-CM

## 2023-10-25 ENCOUNTER — Other Ambulatory Visit (INDEPENDENT_AMBULATORY_CARE_PROVIDER_SITE_OTHER): Payer: Self-pay | Admitting: Nurse Practitioner

## 2023-10-25 DIAGNOSIS — N186 End stage renal disease: Secondary | ICD-10-CM

## 2023-10-26 NOTE — Progress Notes (Signed)
MRN : [id number]  Sharon Arias is a 62 y.o. (10/11/1961) female who presents with chief complaint of check access.  History of Present Illness:   The patient returns to the office for followup of their dialysis access.   The patient reports the function of the access has been stable. Patient denies difficulty with cannulation. The patient denies increased bleeding time after removing the needles. The patient denies hand pain or other symptoms consistent with steal phenomena.  No significant arm swelling.  The patient denies any complaints from the dialysis center or their nephrologist.  The patient denies redness or swelling at the access site. The patient denies fever or chills at home or while on dialysis.  No recent shortening of the patient's walking distance or new symptoms consistent with claudication.  No history of rest pain symptoms. No new ulcers or wounds of the lower extremities have occurred.  The patient denies amaurosis fugax or recent TIA symptoms. There are no recent neurological changes noted. There is no history of DVT, PE or superficial thrombophlebitis. No recent episodes of angina or shortness of breath documented.   Duplex ultrasound of the AV access shows a patent access.  The previously noted stenosis is not significantly changed compared to last study.  Flow volume today is 1372 cc/min (previous flow volume was 978 cc/min)    No outpatient medications have been marked as taking for the 10/27/23 encounter (Appointment) with Gilda Crease, Latina Craver, MD.   Current Facility-Administered Medications for the 10/27/23 encounter (Appointment) with Gilda Crease, Latina Craver, MD  Medication   Chlorhexidine Gluconate Cloth 2 % PADS 6 each    Past Medical History:  Diagnosis Date   Anemia in chronic kidney disease 05/19/2016   Anxiety    Aortic atherosclerosis (HCC)    Blind    CAD (coronary artery disease)    Cardiac murmur    CHF (congestive heart  failure) (HCC)    G2DD on 02/2020 TTE   Chicken pox    Chronic anticoagulation    CVA (cerebral vascular accident) (HCC)    DDD (degenerative disc disease), lumbar    Detached retina    Diabetic neuropathy (HCC)    Diabetic retinopathy (HCC)    legally blind   Dyspnea    ESRD needing dialysis (HCC)    Hx of CABG    Hyperlipidemia    Hypertension    NSTEMI (non-ST elevated myocardial infarction) (HCC) 06/2012   Obesity    Pneumonia    PONV (postoperative nausea and vomiting)    Sciatica of right side    Sleep apnea    CPAP NIGHTLY   T2DM (type 2 diabetes mellitus) (HCC)     Past Surgical History:  Procedure Laterality Date   A/V FISTULAGRAM Left 04/21/2021   Procedure: A/V FISTULAGRAM;  Surgeon: Renford Dills, MD;  Location: [facility];  Service: Cardiovascular;  Laterality: Left;   A/V FISTULAGRAM Left 11/02/2022   Procedure: A/V Fistulagram;  Surgeon: Renford Dills, MD;  Location: [facility];  Service: Cardiovascular;  Laterality: Left;   AV FISTULA PLACEMENT Left 02/06/2021   Procedure: ARTERIOVENOUS (AV) FISTULA CREATION (BRACHIAL CEPHALIC );  Surgeon: Renford Dills, MD;  Location: [facility];  Service: Vascular;  Laterality: Left;   COLONOSCOPY WITH PROPOFOL N/A 01/26/2016   Procedure: COLONOSCOPY WITH PROPOFOL;  Surgeon: Christena Deem, MD;  Location: [facility]  ENDOSCOPY;  Service: Endoscopy;  Laterality: N/A;   COLONOSCOPY WITH PROPOFOL N/A 01/27/2016   Procedure: COLONOSCOPY WITH PROPOFOL;  Surgeon: Christena Deem, MD;  Location: [facility] ENDOSCOPY;  Service: Endoscopy;  Laterality: N/A;   CORONARY ARTERY BYPASS GRAFT  2013   DIALYSIS/PERMA CATHETER INSERTION N/A 02/03/2021   Procedure: DIALYSIS/PERMA CATHETER INSERTION;  Surgeon: Renford Dills, MD;  Location: [facility];  Service: Cardiovascular;  Laterality: N/A;   DIALYSIS/PERMA CATHETER INSERTION N/A 06/16/2021   Procedure: DIALYSIS/PERMA CATHETER INSERTION;  Surgeon: Annice Needy, MD;  Location: [facility];  Service: Cardiovascular;  Laterality: N/A;   DIALYSIS/PERMA CATHETER REMOVAL N/A 09/22/2021   Procedure: DIALYSIS/PERMA CATHETER REMOVAL;  Surgeon: Renford Dills, MD;  Location: [facility];  Service: Cardiovascular;  Laterality: N/A;   INCISION AND DRAINAGE     chest abscess   INCISION AND DRAINAGE ABSCESS N/A 12/11/2018   Procedure: INCISION AND DRAINAGE PERINEAL;  Surgeon: Leafy Ro, MD;  Location: [facility];  Service: General;  Laterality: N/A;   RETINAL LASER PROCEDURE      Social History Social History   Tobacco Use   Smoking status: Never    Passive exposure: Never   Smokeless tobacco: Never  Vaping Use   Vaping status: Never Used  Substance Use Topics   Alcohol use: No   Drug use: No    Family History Family History  Problem Relation Age of Onset   Breast cancer Mother    Prostate cancer Father        we think mets to liver and bone    Allergies  Allergen Reactions   Ozempic (0.25 Or 0.5 Mg-Dose) [Semaglutide(0.25 Or 0.5mg -Dos)] Diarrhea and Nausea Only   Trulicity [Dulaglutide] Diarrhea and Nausea Only   Other     Anesthesia--nausea/vomiting     REVIEW OF SYSTEMS (Negative unless checked)  Constitutional: [] Weight loss  [] Fever  [] Chills Cardiac: [] Chest pain   [] Chest pressure   [] Palpitations   [] Shortness of breath when laying flat   [] Shortness of breath with exertion. Vascular:  [] Pain in legs with walking   [] Pain in legs at rest  [] History of DVT   [] Phlebitis   [] Swelling in legs   [] Varicose veins   [] Non-healing ulcers Pulmonary:   [] Uses home oxygen   [] Productive cough   [] Hemoptysis   [] Wheeze  [] COPD   [] Asthma Neurologic:  [] Dizziness   [] Seizures   [x] History of stroke   [] History of TIA  [] Aphasia   [] Vissual changes   [] Weakness or numbness in arm   [x] Weakness or numbness in leg Musculoskeletal:   [] Joint swelling   [x] Joint pain   [x] Low back pain Hematologic:  [] Easy bruising   [] Easy bleeding   [] Hypercoagulable state   [] Anemic Gastrointestinal:  [] Diarrhea   [] Vomiting  [] Gastroesophageal reflux/heartburn   [] Difficulty swallowing. Genitourinary:  [x] Chronic kidney disease   [] Difficult urination  [] Frequent urination   [] Blood in urine Skin:  [] Rashes   [] Ulcers  Psychological:  [] History of anxiety   []  History of major depression.  Physical Examination  There were no vitals filed for this visit. There is no height or weight on file to calculate BMI. Gen: WD/WN, NAD Head: [place]/AT, No temporalis wasting.  Ear/Nose/Throat: Hearing grossly intact, nares w/o erythema or drainage Eyes: PER, EOMI, sclera nonicteric.  Neck: Supple, no gross masses or lesions.  No JVD.  Pulmonary:  Good air movement, no audible wheezing, no use of accessory muscles.  Cardiac: RRR, precordium non-hyperdynamic.  Vascular:   Good thrill good bruit left upper extremity Vessel Right Left  Radial Palpable Palpable  Brachial Palpable Palpable  Gastrointestinal: soft, non-distended. No guarding/no peritoneal signs.  Musculoskeletal: M/S 5/5 throughout.  No deformity.  Neurologic: CN 2-12 intact. Pain and light touch intact in extremities.  Symmetrical.  Speech is fluent. Motor exam as listed above. Psychiatric: Judgment intact, Mood & affect appropriate for pt's clinical situation. Dermatologic: No rashes or ulcers noted.  No changes consistent with cellulitis.   CBC Lab Results  Component Value Date   WBC 9.5 03/26/2023   HGB 12.0 03/26/2023   HCT 36.3 03/26/2023   MCV 92.8 03/26/2023   PLT 249 03/26/2023    BMET    Component Value Date/Time   NA 139 03/26/2023 1035   K 3.9 03/26/2023 1035   CL 103 03/26/2023 1035   CO2 24 03/26/2023 1035   GLUCOSE 216 (H) 03/26/2023 1035   BUN 34 (H) 03/26/2023 1035   CREATININE 3.45 (H) 03/26/2023 1035   CALCIUM 9.9 03/26/2023 1035   GFRNONAA 15 (L) 03/26/2023 1035   GFRAA 19 (L) 09/12/2020 1331   CrCl cannot be calculated  (Patient's most recent lab result is older than the maximum 21 days allowed.).  COAG Lab Results  Component Value Date   INR 1.1 02/04/2021   INR 1.2 03/18/2020    Radiology No results found.   Assessment/Plan 1. End stage renal disease (HCC) Recommend:  The patient is doing well and currently has adequate dialysis access. The patient's dialysis center is not reporting any access issues. Flow pattern is stable when compared to the prior ultrasound.  The patient should have a duplex ultrasound of the dialysis access in 6 months. The patient will follow-up with me in the office after each ultrasound   - VAS US DUPLEX DIALYSIS ACCESS (AVF, AVG); Future  2. Coronary artery disease involving native coronary artery of native heart without angina pectoris Continue cardiac and antihypertensive medications as already ordered and reviewed, no changes at this time.  Continue statin as ordered and reviewed, no changes at this time  Nitrates PRN for chest pain  3. Primary hypertension Continue antihypertensive medications as already ordered, these medications have been reviewed and there are no changes at this time.  4. Type 2 diabetes mellitus with diabetic nephropathy, without long-term current use of insulin (HCC) Continue hypoglycemic medications as already ordered, these medications have been reviewed and there are no changes at this time.  Hgb A1C to be monitored as already arranged by primary service  5. Degeneration of intervertebral disc of lumbar region, unspecified whether pain present Continue medications to treat the patient's degenerative disease as already ordered, these medications have been reviewed and there are no changes at this time.  Continued activity and therapy was stressed.    Levora Dredge, MD  10/26/2023 9:46 PM

## 2023-10-27 ENCOUNTER — Ambulatory Visit (INDEPENDENT_AMBULATORY_CARE_PROVIDER_SITE_OTHER): Payer: Medicare Other

## 2023-10-27 ENCOUNTER — Encounter (INDEPENDENT_AMBULATORY_CARE_PROVIDER_SITE_OTHER): Payer: Self-pay | Admitting: Vascular Surgery

## 2023-10-27 ENCOUNTER — Ambulatory Visit (INDEPENDENT_AMBULATORY_CARE_PROVIDER_SITE_OTHER): Payer: Medicare Other | Admitting: Vascular Surgery

## 2023-10-27 VITALS — BP 156/68 | HR 61 | Resp 16 | Wt 253.0 lb

## 2023-10-27 DIAGNOSIS — I2510 Atherosclerotic heart disease of native coronary artery without angina pectoris: Secondary | ICD-10-CM | POA: Diagnosis not present

## 2023-10-27 DIAGNOSIS — E1121 Type 2 diabetes mellitus with diabetic nephropathy: Secondary | ICD-10-CM

## 2023-10-27 DIAGNOSIS — M51369 Other intervertebral disc degeneration, lumbar region without mention of lumbar back pain or lower extremity pain: Secondary | ICD-10-CM

## 2023-10-27 DIAGNOSIS — N186 End stage renal disease: Secondary | ICD-10-CM | POA: Diagnosis not present

## 2023-10-27 DIAGNOSIS — I10 Essential (primary) hypertension: Secondary | ICD-10-CM

## 2023-10-30 ENCOUNTER — Encounter (INDEPENDENT_AMBULATORY_CARE_PROVIDER_SITE_OTHER): Payer: Self-pay | Admitting: Vascular Surgery

## 2023-11-26 ENCOUNTER — Other Ambulatory Visit: Payer: Self-pay

## 2023-11-26 ENCOUNTER — Emergency Department: Payer: Medicare Other

## 2023-11-26 ENCOUNTER — Inpatient Hospital Stay
Admission: EM | Admit: 2023-11-26 | Discharge: 2023-12-02 | DRG: 064 | Disposition: A | Discharge status: Rehabilitation Facility | Payer: Medicare Other | Attending: Internal Medicine | Admitting: Internal Medicine

## 2023-11-26 DIAGNOSIS — G4730 Sleep apnea, unspecified: Secondary | ICD-10-CM | POA: Diagnosis present

## 2023-11-26 DIAGNOSIS — I132 Hypertensive heart and chronic kidney disease with heart failure and with stage 5 chronic kidney disease, or end stage renal disease: Secondary | ICD-10-CM | POA: Diagnosis present

## 2023-11-26 DIAGNOSIS — N186 End stage renal disease: Secondary | ICD-10-CM | POA: Diagnosis present

## 2023-11-26 DIAGNOSIS — F419 Anxiety disorder, unspecified: Secondary | ICD-10-CM | POA: Diagnosis present

## 2023-11-26 DIAGNOSIS — Z1152 Encounter for screening for COVID-19: Secondary | ICD-10-CM

## 2023-11-26 DIAGNOSIS — Z6839 Body mass index (BMI) 39.0-39.9, adult: Secondary | ICD-10-CM

## 2023-11-26 DIAGNOSIS — I5032 Chronic diastolic (congestive) heart failure: Secondary | ICD-10-CM | POA: Diagnosis present

## 2023-11-26 DIAGNOSIS — Z751 Person awaiting admission to adequate facility elsewhere: Secondary | ICD-10-CM

## 2023-11-26 DIAGNOSIS — E1165 Type 2 diabetes mellitus with hyperglycemia: Secondary | ICD-10-CM | POA: Diagnosis present

## 2023-11-26 DIAGNOSIS — R29705 NIHSS score 5: Secondary | ICD-10-CM | POA: Diagnosis present

## 2023-11-26 DIAGNOSIS — R29810 Facial weakness: Secondary | ICD-10-CM | POA: Diagnosis present

## 2023-11-26 DIAGNOSIS — Z888 Allergy status to other drugs, medicaments and biological substances status: Secondary | ICD-10-CM

## 2023-11-26 DIAGNOSIS — Z7982 Long term (current) use of aspirin: Secondary | ICD-10-CM

## 2023-11-26 DIAGNOSIS — I6381 Other cerebral infarction due to occlusion or stenosis of small artery: Principal | ICD-10-CM | POA: Diagnosis present

## 2023-11-26 DIAGNOSIS — E785 Hyperlipidemia, unspecified: Secondary | ICD-10-CM | POA: Diagnosis present

## 2023-11-26 DIAGNOSIS — I252 Old myocardial infarction: Secondary | ICD-10-CM

## 2023-11-26 DIAGNOSIS — Z7901 Long term (current) use of anticoagulants: Secondary | ICD-10-CM

## 2023-11-26 DIAGNOSIS — I700 Atherosclerosis of aorta: Secondary | ICD-10-CM | POA: Diagnosis present

## 2023-11-26 DIAGNOSIS — R42 Dizziness and giddiness: Secondary | ICD-10-CM | POA: Diagnosis not present

## 2023-11-26 DIAGNOSIS — E1129 Type 2 diabetes mellitus with other diabetic kidney complication: Secondary | ICD-10-CM | POA: Diagnosis present

## 2023-11-26 DIAGNOSIS — E11319 Type 2 diabetes mellitus with unspecified diabetic retinopathy without macular edema: Secondary | ICD-10-CM | POA: Diagnosis present

## 2023-11-26 DIAGNOSIS — Z951 Presence of aortocoronary bypass graft: Secondary | ICD-10-CM

## 2023-11-26 DIAGNOSIS — E1140 Type 2 diabetes mellitus with diabetic neuropathy, unspecified: Secondary | ICD-10-CM | POA: Diagnosis present

## 2023-11-26 DIAGNOSIS — H548 Legal blindness, as defined in USA: Secondary | ICD-10-CM | POA: Diagnosis present

## 2023-11-26 DIAGNOSIS — I639 Cerebral infarction, unspecified: Principal | ICD-10-CM | POA: Diagnosis present

## 2023-11-26 DIAGNOSIS — Z794 Long term (current) use of insulin: Secondary | ICD-10-CM

## 2023-11-26 DIAGNOSIS — Z8673 Personal history of transient ischemic attack (TIA), and cerebral infarction without residual deficits: Secondary | ICD-10-CM

## 2023-11-26 DIAGNOSIS — D631 Anemia in chronic kidney disease: Secondary | ICD-10-CM | POA: Diagnosis present

## 2023-11-26 DIAGNOSIS — I10 Essential (primary) hypertension: Secondary | ICD-10-CM | POA: Diagnosis present

## 2023-11-26 DIAGNOSIS — H532 Diplopia: Secondary | ICD-10-CM | POA: Diagnosis present

## 2023-11-26 DIAGNOSIS — H538 Other visual disturbances: Secondary | ICD-10-CM | POA: Diagnosis present

## 2023-11-26 DIAGNOSIS — Z955 Presence of coronary angioplasty implant and graft: Secondary | ICD-10-CM

## 2023-11-26 DIAGNOSIS — I2510 Atherosclerotic heart disease of native coronary artery without angina pectoris: Secondary | ICD-10-CM | POA: Diagnosis present

## 2023-11-26 DIAGNOSIS — N2581 Secondary hyperparathyroidism of renal origin: Secondary | ICD-10-CM | POA: Diagnosis present

## 2023-11-26 DIAGNOSIS — R001 Bradycardia, unspecified: Secondary | ICD-10-CM | POA: Diagnosis present

## 2023-11-26 DIAGNOSIS — Z79899 Other long term (current) drug therapy: Secondary | ICD-10-CM

## 2023-11-26 DIAGNOSIS — Z7902 Long term (current) use of antithrombotics/antiplatelets: Secondary | ICD-10-CM

## 2023-11-26 DIAGNOSIS — M109 Gout, unspecified: Secondary | ICD-10-CM | POA: Diagnosis present

## 2023-11-26 DIAGNOSIS — E1122 Type 2 diabetes mellitus with diabetic chronic kidney disease: Secondary | ICD-10-CM | POA: Diagnosis present

## 2023-11-26 DIAGNOSIS — Z992 Dependence on renal dialysis: Secondary | ICD-10-CM

## 2023-11-26 LAB — URINE DRUG SCREEN, QUALITATIVE (ARMC ONLY)
Amphetamines, Ur Screen: NOT DETECTED
Barbiturates, Ur Screen: NOT DETECTED
Benzodiazepine, Ur Scrn: NOT DETECTED
Cannabinoid 50 Ng, Ur ~~LOC~~: NOT DETECTED
Cocaine Metabolite,Ur ~~LOC~~: NOT DETECTED
MDMA (Ecstasy)Ur Screen: NOT DETECTED
Methadone Scn, Ur: NOT DETECTED
Opiate, Ur Screen: NOT DETECTED
Phencyclidine (PCP) Ur S: NOT DETECTED
Tricyclic, Ur Screen: NOT DETECTED

## 2023-11-26 LAB — CBC
HCT: 34.8 % — ABNORMAL LOW (ref 36.0–46.0)
Hemoglobin: 11.7 g/dL — ABNORMAL LOW (ref 12.0–15.0)
MCH: 30.8 pg (ref 26.0–34.0)
MCHC: 33.6 g/dL (ref 30.0–36.0)
MCV: 91.6 fL (ref 80.0–100.0)
Platelets: 268 10*3/uL (ref 150–400)
RBC: 3.8 MIL/uL — ABNORMAL LOW (ref 3.87–5.11)
RDW: 14 % (ref 11.5–15.5)
WBC: 13.1 10*3/uL — ABNORMAL HIGH (ref 4.0–10.5)
nRBC: 0 % (ref 0.0–0.2)

## 2023-11-26 LAB — BASIC METABOLIC PANEL
Anion gap: 17 — ABNORMAL HIGH (ref 5–15)
BUN: 36 mg/dL — ABNORMAL HIGH (ref 8–23)
CO2: 24 mmol/L (ref 22–32)
Calcium: 9.6 mg/dL (ref 8.9–10.3)
Chloride: 97 mmol/L — ABNORMAL LOW (ref 98–111)
Creatinine, Ser: 3.52 mg/dL — ABNORMAL HIGH (ref 0.44–1.00)
GFR, Estimated: 14 mL/min — ABNORMAL LOW (ref >60)
Glucose, Bld: 297 mg/dL — ABNORMAL HIGH (ref 70–99)
Potassium: 3.7 mmol/L (ref 3.5–5.1)
Sodium: 138 mmol/L (ref 135–145)

## 2023-11-26 LAB — HEMOGLOBIN A1C
Hgb A1c MFr Bld: 8.1 % — ABNORMAL HIGH (ref 4.8–5.6)
Mean Plasma Glucose: 185.77 mg/dL

## 2023-11-26 MED ORDER — ASPIRIN 81 MG PO CHEW
81.0000 mg | CHEWABLE_TABLET | Freq: Every day | ORAL | Status: DC
  Administered 2023-11-27 – 2023-12-01 (×5): 81 mg via ORAL
  Filled 2023-11-26 (×5): qty 1

## 2023-11-26 MED ORDER — ACETAMINOPHEN 160 MG/5ML PO SOLN: 650.0000 mg | ORAL | Status: DC | PRN

## 2023-11-26 MED ORDER — STROKE: EARLY STAGES OF RECOVERY BOOK: Freq: Once | Status: DC

## 2023-11-26 MED ORDER — CLOPIDOGREL BISULFATE 75 MG PO TABS
75.0000 mg | ORAL_TABLET | Freq: Once | ORAL | Status: AC
  Administered 2023-11-26: 75 mg via ORAL
  Filled 2023-11-26: qty 1

## 2023-11-26 MED ORDER — SODIUM CHLORIDE 0.9 % IV BOLUS
1000.0000 mL | Freq: Once | INTRAVENOUS | Status: AC
  Administered 2023-11-26: 1000 mL via INTRAVENOUS

## 2023-11-26 MED ORDER — ACETAMINOPHEN 650 MG RE SUPP: 650.0000 mg | RECTAL | Status: DC | PRN

## 2023-11-26 MED ORDER — ATORVASTATIN CALCIUM 20 MG PO TABS
40.0000 mg | ORAL_TABLET | Freq: Every day | ORAL | Status: DC
  Administered 2023-11-27 (×2): 40 mg via ORAL
  Filled 2023-11-26 (×2): qty 2

## 2023-11-26 MED ORDER — ONDANSETRON HCL 4 MG/2ML IJ SOLN
4.0000 mg | Freq: Once | INTRAMUSCULAR | Status: AC
  Administered 2023-11-26: 4 mg via INTRAVENOUS
  Filled 2023-11-26: qty 2

## 2023-11-26 MED ORDER — ACETAMINOPHEN 325 MG PO TABS: 650.0000 mg | ORAL_TABLET | ORAL | Status: DC | PRN

## 2023-11-26 MED ORDER — SODIUM CHLORIDE 0.9 % IV SOLN: INTRAVENOUS | Status: DC

## 2023-11-26 MED ORDER — HEPARIN SODIUM (PORCINE) 5000 UNIT/ML IJ SOLN
5000.0000 [IU] | Freq: Three times a day (TID) | INTRAMUSCULAR | Status: DC
  Administered 2023-11-27 – 2023-12-02 (×17): 5000 [IU] via SUBCUTANEOUS
  Filled 2023-11-26 (×17): qty 1

## 2023-11-26 MED ORDER — CLOPIDOGREL BISULFATE 75 MG PO TABS
75.0000 mg | ORAL_TABLET | Freq: Every day | ORAL | Status: DC
  Administered 2023-11-27 – 2023-12-01 (×5): 75 mg via ORAL
  Filled 2023-11-26 (×5): qty 1

## 2023-11-26 MED ORDER — LORAZEPAM 2 MG/ML IJ SOLN
2.0000 mg | Freq: Once | INTRAMUSCULAR | Status: AC
  Administered 2023-11-26: 2 mg via INTRAVENOUS
  Filled 2023-11-26: qty 1

## 2023-11-26 MED ORDER — ASPIRIN 81 MG PO CHEW
81.0000 mg | CHEWABLE_TABLET | Freq: Once | ORAL | Status: AC
  Administered 2023-11-26: 81 mg via ORAL
  Filled 2023-11-26: qty 1

## 2023-11-26 NOTE — ED Notes (Signed)
Patient transported to MRI 

## 2023-11-26 NOTE — ED Notes (Signed)
Assisted NT Alexis with helping pt to bedside commode. Pt states it takes a while for her to use the bathroom. Provided call light and advised pt to ring bell once complete.

## 2023-11-26 NOTE — H&P (Incomplete)
History and Physical    Patient: Sharon Arias WUJ:[id number] DOB: [birth year] DOA: 11/26/2023 DOS: the patient was seen and examined on 11/26/2023 PCP: Marguarite Arbour, MD  Patient coming from: Home  Chief Complaint:  Chief Complaint  Patient presents with  . Dizziness    HPI: Sharon Arias is a 62 y.o. female with medical history of CVA, essential hypertension, allergies to Trulicity significant for ***    In emergency room vitals trend shows: Vitals:   11/26/23 1225 11/26/23 1226 11/26/23 1227 11/26/23 1632  BP:   (!) 175/59   Pulse:  67    Temp: 97.6 F (36.4 C)   97.7 F (36.5 C)  Resp:  20    Height:  5\' 7"  (1.702 m)    Weight:  113.4 kg    SpO2:  96%    TempSrc:    Oral  BMI (Calculated):  39.15       Labs are notable for ***  In the ED pt received: Medications  aspirin chewable tablet 81 mg (has no administration in time range)  clopidogrel (PLAVIX) tablet 75 mg (has no administration in time range)  sodium chloride 0.9 % bolus 1,000 mL (1,000 mLs Intravenous New Bag/Given 11/26/23 1749)  ondansetron (ZOFRAN) injection 4 mg (4 mg Intravenous Given 11/26/23 1744)  LORazepam (ATIVAN) injection 2 mg (2 mg Intravenous Given 11/26/23 1743)     Review of Systems  Eyes:  Positive for blurred vision.  Neurological:  Positive for dizziness.  All other systems reviewed and are negative.  Past Medical History:  Diagnosis Date  . Anemia in chronic kidney disease 05/19/2016  . Anxiety   . Aortic atherosclerosis (HCC)   . Blind   . CAD (coronary artery disease)   . Cardiac murmur   . CHF (congestive heart failure) (HCC)    G2DD on 02/2020 TTE  . Chicken pox   . Chronic anticoagulation   . CVA (cerebral vascular accident) (HCC)   . DDD (degenerative disc disease), lumbar   . Detached retina   . Diabetic neuropathy (HCC)   . Diabetic retinopathy (HCC)    legally blind  . Dyspnea   . ESRD needing dialysis (HCC)   . Hx of CABG   . Hyperlipidemia   .  Hypertension   . NSTEMI (non-ST elevated myocardial infarction) (HCC) 06/2012  . Obesity   . Pneumonia   . PONV (postoperative nausea and vomiting)   . Sciatica of right side   . Sleep apnea    CPAP NIGHTLY  . T2DM (type 2 diabetes mellitus) (HCC)    Past Surgical History:  Procedure Laterality Date  . A/V FISTULAGRAM Left 04/21/2021   Procedure: A/V FISTULAGRAM;  Surgeon: Renford Dills, MD;  Location: [facility] INVASIVE CV LAB;  Service: Cardiovascular;  Laterality: Left;  . A/V FISTULAGRAM Left 11/02/2022   Procedure: A/V Fistulagram;  Surgeon: Renford Dills, MD;  Location: [facility] INVASIVE CV LAB;  Service: Cardiovascular;  Laterality: Left;  . AV FISTULA PLACEMENT Left 02/06/2021   Procedure: ARTERIOVENOUS (AV) FISTULA CREATION (BRACHIAL CEPHALIC );  Surgeon: Renford Dills, MD;  Location: [facility] ORS;  Service: Vascular;  Laterality: Left;  . COLONOSCOPY WITH PROPOFOL N/A 01/26/2016   Procedure: COLONOSCOPY WITH PROPOFOL;  Surgeon: Christena Deem, MD;  Location: [facility] ENDOSCOPY;  Service: Endoscopy;  Laterality: N/A;  . COLONOSCOPY WITH PROPOFOL N/A 01/27/2016   Procedure: COLONOSCOPY WITH PROPOFOL;  Surgeon: Christena Deem, MD;  Location: [facility] ENDOSCOPY;  Service: Endoscopy;  Laterality:  N/A;  . CORONARY ARTERY BYPASS GRAFT  2013  . DIALYSIS/PERMA CATHETER INSERTION N/A 02/03/2021   Procedure: DIALYSIS/PERMA CATHETER INSERTION;  Surgeon: Renford Dills, MD;  Location: [facility] INVASIVE CV LAB;  Service: Cardiovascular;  Laterality: N/A;  . DIALYSIS/PERMA CATHETER INSERTION N/A 06/16/2021   Procedure: DIALYSIS/PERMA CATHETER INSERTION;  Surgeon: Annice Needy, MD;  Location: [facility] INVASIVE CV LAB;  Service: Cardiovascular;  Laterality: N/A;  . DIALYSIS/PERMA CATHETER REMOVAL N/A 09/22/2021   Procedure: DIALYSIS/PERMA CATHETER REMOVAL;  Surgeon: Renford Dills, MD;  Location: [facility] INVASIVE CV LAB;  Service: Cardiovascular;  Laterality: N/A;  . INCISION AND DRAINAGE     chest abscess   . INCISION AND DRAINAGE ABSCESS N/A 12/11/2018   Procedure: INCISION AND DRAINAGE PERINEAL;  Surgeon: Leafy Ro, MD;  Location: [facility] ORS;  Service: General;  Laterality: N/A;  . RETINAL LASER PROCEDURE      reports that she has never smoked. She has never been exposed to tobacco smoke. She has never used smokeless tobacco. She reports that she does not drink alcohol and does not use drugs.  Allergies  Allergen Reactions  . Ozempic (0.25 Or 0.5 Mg-Dose) [Semaglutide(0.25 Or 0.5mg -Dos)] Diarrhea and Nausea Only  . Trulicity [Dulaglutide] Diarrhea and Nausea Only  . Other     Anesthesia--nausea/vomiting    Family History  Problem Relation Age of Onset  . Breast cancer Mother   . Prostate cancer Father        we think mets to liver and bone    Prior to Admission medications   Medication Sig Start Date End Date Taking? Authorizing Provider  allopurinol (ZYLOPRIM) 300 MG tablet Take 300 mg by mouth in the morning. 02/09/21   [provider]  amLODipine (NORVASC) 5 MG tablet Take 5 mg by mouth at bedtime. 12/04/18   [provider]  aspirin EC 81 MG tablet Take 81 mg by mouth in the morning.    [provider]  clopidogrel (PLAVIX) 75 MG tablet Take 1 tablet (75 mg total) by mouth daily. Patient taking differently: Take 75 mg by mouth in the morning. 07/23/19   Altamese Dilling, MD  hydrALAZINE (APRESOLINE) 50 MG tablet Take 50 mg by mouth in the morning and at bedtime. 08/21/20   [provider]  Insulin Degludec (TRESIBA) 100 UNIT/ML SOLN Inject 75 mg into the skin at bedtime. 03/05/21   [provider]  insulin lispro (HUMALOG) 100 UNIT/ML KwikPen Inject 15-40 Units into the skin See admin instructions. Pt. Uses 15 units/ml in the mornings, 30 units/ml in the lunch, and 40 units/ml at [employer] 10/02/18   [provider]  lidocaine-prilocaine (EMLA) cream Apply 1 application topically as needed (prior to dialysis).  05/01/21   [provider]  losartan (COZAAR) 100 MG tablet Take 100 mg by mouth in the morning.    [provider]  meclizine (ANTIVERT) 25 MG tablet Take 1 tablet (25 mg total) by mouth 3 (three) times daily as needed for dizziness. 03/26/23   Merwyn Katos, MD  metoprolol tartrate (LOPRESSOR) 50 MG tablet Take 50 mg by mouth 2 (two) times daily.     [provider]  multivitamin (RENA-VIT) TABS tablet Take 1 tablet by mouth at bedtime. 02/18/21   [provider]  ondansetron (ZOFRAN-ODT) 8 MG disintegrating tablet Take 1 tablet (8 mg total) by mouth every 8 (eight) hours as needed for nausea or vomiting. 03/26/23   Merwyn Katos, MD  ONETOUCH VERIO test strip  01/02/21   [provider]  rosuvastatin (CRESTOR) 20 MG tablet Take 40 mg by mouth at bedtime. 03/13/20   [provider]     Vitals:   11/26/23 1225 11/26/23 1226 11/26/23 1227 11/26/23 1632  BP:   (!) 175/59   Pulse:  67    Resp:  20    Temp: 97.6 F (36.4 C)   97.7 F (36.5 C)  TempSrc:    Oral  SpO2:  96%    Weight:  113.4 kg    Height:  5\' 7"  (1.702 m)     Physical Exam Vitals and nursing note reviewed.  Constitutional:      General: She is not in acute distress. HENT:     Head: Normocephalic and atraumatic.     Right Ear: Hearing normal.     Left Ear: Hearing normal.     Nose: Nose normal. No nasal deformity.     Mouth/Throat:     Lips: Pink.     Tongue: No lesions.     Pharynx: Oropharynx is clear.  Eyes:     General: Lids are normal.     Extraocular Movements: Extraocular movements intact.  Neck:     Vascular: No carotid bruit.  Cardiovascular:     Rate and Rhythm: Normal rate and regular rhythm.     Heart sounds: Normal heart sounds.  Pulmonary:     Effort: Pulmonary effort is normal.     Breath sounds: Normal breath sounds.  Abdominal:     General: Bowel sounds are normal. There is no distension.     Palpations: Abdomen is soft. There is no mass.      Tenderness: There is no abdominal tenderness.  Musculoskeletal:     Cervical back: No rigidity.     Right lower leg: No edema.     Left lower leg: No edema.  Skin:    General: Skin is warm.  Neurological:     General: No focal deficit present.     Mental Status: She is alert and oriented to person, place, and time.     Cranial Nerves: Cranial nerves 2-12 are intact.  Psychiatric:        Attention and Perception: Attention normal.        Mood and Affect: Mood normal.        Speech: Speech normal.        Behavior: Behavior normal. Behavior is cooperative.     Labs on Admission: I have personally reviewed following labs and imaging studies Results for orders placed or performed during the hospital encounter of 11/26/23 (from the past 24 hour(s))  CBC     Status: Abnormal   Collection Time: 11/26/23 12:28 PM  Result Value Ref Range   WBC 13.1 (H) 4.0 - 10.5 K/uL   RBC 3.80 (L) 3.87 - 5.11 MIL/uL   Hemoglobin 11.7 (L) 12.0 - 15.0 g/dL   HCT 47.8 (L) 29.5 - 62.1 %   MCV 91.6 80.0 - 100.0 fL   MCH 30.8 26.0 - 34.0 pg   MCHC 33.6 30.0 - 36.0 g/dL   RDW 30.8 65.7 - 84.6 %   Platelets 268 150 - 400 K/uL   nRBC 0.0 0.0 - 0.2 %  Basic metabolic panel     Status: Abnormal   Collection Time: 11/26/23 12:28 PM  Result Value Ref Range   Sodium 138 135 - 145 mmol/L   Potassium 3.7 3.5 - 5.1 mmol/L   Chloride 97 (L) 98 - 111  mmol/L   CO2 24 22 - 32 mmol/L   Glucose, Bld 297 (H) 70 - 99 mg/dL   BUN 36 (H) 8 - 23 mg/dL   Creatinine, Ser 1.61 (H) 0.44 - 1.00 mg/dL   Calcium 9.6 8.9 - 09.6 mg/dL   GFR, Estimated 14 (L) >60 mL/min   Anion gap 17 (H) 5 - 15    CBC: Recent Labs  Lab 11/26/23 1228  WBC 13.1*  HGB 11.7*  HCT 34.8*  MCV 91.6  PLT 268   Basic Metabolic Panel: Recent Labs  Lab 11/26/23 1228  NA 138  K 3.7  CL 97*  CO2 24  GLUCOSE 297*  BUN 36*  CREATININE 3.52*  CALCIUM 9.6   GFR: Estimated Creatinine Clearance: 21.5 mL/min (A) (by C-G formula based on SCr  of 3.52 mg/dL (H)). Liver Function Tests: No results for input(s): "AST", "ALT", "ALKPHOS", "BILITOT", "PROT", "ALBUMIN" in the last 168 hours. No results for input(s): "LIPASE", "AMYLASE" in the last 168 hours. No results for input(s): "AMMONIA" in the last 168 hours. Coagulation Profile: No results for input(s): "INR", "PROTIME" in the last 168 hours. Cardiac Enzymes: No results for input(s): "CKTOTAL", "CKMB", "CKMBINDEX", "TROPONINI" in the last 168 hours. BNP (last 3 results) No results for input(s): "PROBNP" in the last 8760 hours. HbA1C: No results for input(s): "HGBA1C" in the last 72 hours. CBG: No results for input(s): "GLUCAP" in the last 168 hours. Lipid Profile: No results for input(s): "CHOL", "HDL", "LDLCALC", "TRIG", "CHOLHDL", "LDLDIRECT" in the last 72 hours. Thyroid Function Tests: No results for input(s): "TSH", "T4TOTAL", "FREET4", "T3FREE", "THYROIDAB" in the last 72 hours. Anemia Panel: No results for input(s): "VITAMINB12", "FOLATE", "FERRITIN", "TIBC", "IRON", "RETICCTPCT" in the last 72 hours. Urinalysis    Component Value Date/Time   COLORURINE STRAW (A) 05/12/2020 1409   APPEARANCEUR HAZY (A) 05/12/2020 1409   LABSPEC 1.011 05/12/2020 1409   PHURINE 5.0 05/12/2020 1409   GLUCOSEU 150 (A) 05/12/2020 1409   HGBUR LARGE (A) 05/12/2020 1409   BILIRUBINUR NEGATIVE 05/12/2020 1409   KETONESUR NEGATIVE 05/12/2020 1409   PROTEINUR 100 (A) 05/12/2020 1409   NITRITE NEGATIVE 05/12/2020 1409   LEUKOCYTESUR NEGATIVE 05/12/2020 1409      Unresulted Labs (From admission, onward)    None       Medications  aspirin chewable tablet 81 mg (has no administration in time range)  clopidogrel (PLAVIX) tablet 75 mg (has no administration in time range)  sodium chloride 0.9 % bolus 1,000 mL (1,000 mLs Intravenous New Bag/Given 11/26/23 1749)  ondansetron (ZOFRAN) injection 4 mg (4 mg Intravenous Given 11/26/23 1744)  LORazepam (ATIVAN) injection 2 mg (2 mg  Intravenous Given 11/26/23 1743)    Radiological Exams on Admission: MR BRAIN WO CONTRAST  Result Date: 11/26/2023 CLINICAL DATA:  Neuro deficit, acute, stroke suspected; Vertebral artery dissection suspected; Vertigo, central EXAM: MRI HEAD WITHOUT CONTRAST MRA HEAD WITHOUT CONTRAST MRA NECK WITHOUT CONTRAST TECHNIQUE: Multiplanar, multi-echo pulse sequences of the brain and surrounding structures were acquired without intravenous contrast. Angiographic images of the Circle of Willis were acquired using MRA technique without intravenous contrast. Angiographic images of the neck were acquired using MRA technique without intravenous contrast. Carotid stenosis measurements (when applicable) are obtained utilizing NASCET criteria, using the distal internal carotid diameter as the denominator. COMPARISON:  None Available. FINDINGS: MRI HEAD FINDINGS Brain: There acute infarcts in the brachium pontis on the left (series 8, image 10). No hemorrhage. No hydrocephalus. No extra-axial fluid collection. No mass effect. No  mass lesion. Background of chronic microvascular ischemic change with a chronic infarct in the right parietal lobe. Vascular: Normal flow voids. Skull and upper cervical spine: Normal marrow signal. Sinuses/Orbits: No middle ear or mastoid effusion. Paranasal sinuses are clear. Bilateral lens replacement. Orbits are otherwise unremarkable. Other: None. MRA HEAD FINDINGS Anterior circulation: Severe narrowing in the ophthalmic segment of the right ICA (series 1, image 71). No aneurysm. No vascular malformation. No occlusion. Posterior circulation: No aneurysm. No occlusion. No significant stenosis. Anatomic variants: None MRA NECK FINDINGS Aortic arch: Not visualized, likely to respiratory motion artifact. Right carotid system: The right common carotid artery in the proximal aspect of the right internal carotid artery are poorly visualized and incompletely assessed due to the degree of respiratory motion  artifact. The mid to distal segments of the right ICA are normal in appearance. Left carotid system: The left common carotid artery in the proximal aspect of the left internal carotid artery are poorly visualized and incompletely assessed due to the degree of respiratory motion artifact. The mid to distal segments of the left ICA are normal in appearance. Vertebral arteries: The V1 segments of bilateral vertebral arteries are not visualized due to the degree of respiratory motion artifact. The distal V2 segments in the V4 segments are normal in appearance. Other: None. IMPRESSION: 1. Acute infarct in the brachium pontis on the left. 2. Severe narrowing in the ophthalmic segment of the right ICA. 3. The aortic arch, common carotid arteries, proximal internal carotid arteries, and V1 segments of bilateral vertebral arteries are poorly visualized and incompletely assessed due to the degree of respiratory motion artifact. The mid to distal segments of the carotid and vertebral arteries are normal in appearance. Electronically Signed   By: Lorenza Cambridge M.D.   On: 11/26/2023 19:28   MR ANGIO HEAD WO CONTRAST  Result Date: 11/26/2023 CLINICAL DATA:  Neuro deficit, acute, stroke suspected; Vertebral artery dissection suspected; Vertigo, central EXAM: MRI HEAD WITHOUT CONTRAST MRA HEAD WITHOUT CONTRAST MRA NECK WITHOUT CONTRAST TECHNIQUE: Multiplanar, multi-echo pulse sequences of the brain and surrounding structures were acquired without intravenous contrast. Angiographic images of the Circle of Willis were acquired using MRA technique without intravenous contrast. Angiographic images of the neck were acquired using MRA technique without intravenous contrast. Carotid stenosis measurements (when applicable) are obtained utilizing NASCET criteria, using the distal internal carotid diameter as the denominator. COMPARISON:  None Available. FINDINGS: MRI HEAD FINDINGS Brain: There acute infarcts in the brachium pontis on  the left (series 8, image 10). No hemorrhage. No hydrocephalus. No extra-axial fluid collection. No mass effect. No mass lesion. Background of chronic microvascular ischemic change with a chronic infarct in the right parietal lobe. Vascular: Normal flow voids. Skull and upper cervical spine: Normal marrow signal. Sinuses/Orbits: No middle ear or mastoid effusion. Paranasal sinuses are clear. Bilateral lens replacement. Orbits are otherwise unremarkable. Other: None. MRA HEAD FINDINGS Anterior circulation: Severe narrowing in the ophthalmic segment of the right ICA (series 1, image 71). No aneurysm. No vascular malformation. No occlusion. Posterior circulation: No aneurysm. No occlusion. No significant stenosis. Anatomic variants: None MRA NECK FINDINGS Aortic arch: Not visualized, likely to respiratory motion artifact. Right carotid system: The right common carotid artery in the proximal aspect of the right internal carotid artery are poorly visualized and incompletely assessed due to the degree of respiratory motion artifact. The mid to distal segments of the right ICA are normal in appearance. Left carotid system: The left common carotid artery in the proximal aspect  of the left internal carotid artery are poorly visualized and incompletely assessed due to the degree of respiratory motion artifact. The mid to distal segments of the left ICA are normal in appearance. Vertebral arteries: The V1 segments of bilateral vertebral arteries are not visualized due to the degree of respiratory motion artifact. The distal V2 segments in the V4 segments are normal in appearance. Other: None. IMPRESSION: 1. Acute infarct in the brachium pontis on the left. 2. Severe narrowing in the ophthalmic segment of the right ICA. 3. The aortic arch, common carotid arteries, proximal internal carotid arteries, and V1 segments of bilateral vertebral arteries are poorly visualized and incompletely assessed due to the degree of respiratory  motion artifact. The mid to distal segments of the carotid and vertebral arteries are normal in appearance. Electronically Signed   By: Lorenza Cambridge M.D.   On: 11/26/2023 19:28   MR ANGIO NECK WO CONTRAST  Result Date: 11/26/2023 CLINICAL DATA:  Neuro deficit, acute, stroke suspected; Vertebral artery dissection suspected; Vertigo, central EXAM: MRI HEAD WITHOUT CONTRAST MRA HEAD WITHOUT CONTRAST MRA NECK WITHOUT CONTRAST TECHNIQUE: Multiplanar, multi-echo pulse sequences of the brain and surrounding structures were acquired without intravenous contrast. Angiographic images of the Circle of Willis were acquired using MRA technique without intravenous contrast. Angiographic images of the neck were acquired using MRA technique without intravenous contrast. Carotid stenosis measurements (when applicable) are obtained utilizing NASCET criteria, using the distal internal carotid diameter as the denominator. COMPARISON:  None Available. FINDINGS: MRI HEAD FINDINGS Brain: There acute infarcts in the brachium pontis on the left (series 8, image 10). No hemorrhage. No hydrocephalus. No extra-axial fluid collection. No mass effect. No mass lesion. Background of chronic microvascular ischemic change with a chronic infarct in the right parietal lobe. Vascular: Normal flow voids. Skull and upper cervical spine: Normal marrow signal. Sinuses/Orbits: No middle ear or mastoid effusion. Paranasal sinuses are clear. Bilateral lens replacement. Orbits are otherwise unremarkable. Other: None. MRA HEAD FINDINGS Anterior circulation: Severe narrowing in the ophthalmic segment of the right ICA (series 1, image 71). No aneurysm. No vascular malformation. No occlusion. Posterior circulation: No aneurysm. No occlusion. No significant stenosis. Anatomic variants: None MRA NECK FINDINGS Aortic arch: Not visualized, likely to respiratory motion artifact. Right carotid system: The right common carotid artery in the proximal aspect of the  right internal carotid artery are poorly visualized and incompletely assessed due to the degree of respiratory motion artifact. The mid to distal segments of the right ICA are normal in appearance. Left carotid system: The left common carotid artery in the proximal aspect of the left internal carotid artery are poorly visualized and incompletely assessed due to the degree of respiratory motion artifact. The mid to distal segments of the left ICA are normal in appearance. Vertebral arteries: The V1 segments of bilateral vertebral arteries are not visualized due to the degree of respiratory motion artifact. The distal V2 segments in the V4 segments are normal in appearance. Other: None. IMPRESSION: 1. Acute infarct in the brachium pontis on the left. 2. Severe narrowing in the ophthalmic segment of the right ICA. 3. The aortic arch, common carotid arteries, proximal internal carotid arteries, and V1 segments of bilateral vertebral arteries are poorly visualized and incompletely assessed due to the degree of respiratory motion artifact. The mid to distal segments of the carotid and vertebral arteries are normal in appearance. Electronically Signed   By: Lorenza Cambridge M.D.   On: 11/26/2023 19:28     Data Reviewed:  Relevant notes from primary care and specialist visits, past discharge summaries as available in EHR, including Care Everywhere. Prior diagnostic testing as pertinent to current admission diagnoses Updated medications and problem lists for reconciliation ED course, including vitals, labs, imaging, treatment and response to treatment Triage notes, nursing and pharmacy notes and ED provider's notes Notable results as noted in HPI  Assessment and Plan: No notes have been filed under this hospital service. Service: Hospitalist       Prognosis: ***  DVT prophylaxis:  ***  Consults:  ***  Advance Care Planning:    Code Status: Prior   Family Communication:  ***  Disposition Plan:   ***  Severity of Illness: {Observation/Inpatient:21159}  Author: Gertha Calkin, MD 11/26/2023 7:55 PM  For on call review [email or web address].

## 2023-11-26 NOTE — ED Provider Notes (Signed)
[facility] Provider Note    Event Date/Time   First MD Initiated Contact with Patient 11/26/23 1605     (approximate)   History   Chief Complaint: Dizziness   HPI  Sharon Arias is a 62 y.o. female with a history of hypertension, diabetes, CHF, obesity, stroke, retinal detachment, ESRD on dialysis who comes to the ED due to dizziness starting suddenly yesterday morning which feels like previous episodes of peripheral vertigo.  She reports that she was in her usual state of health, at dialysis, and with turning her head to look at the television she had sudden onset of severe dizziness feeling like the room is spinning.  Feels like there is a downward pressure on her head.  Also complains of pain in the posterior neck.  She has chronic blindness but feels like her vision is worsened.  Denies fever or chills, no motor weakness or paresthesia, no fall or trauma.   Reviewed outside records noting that patient had similar symptoms in 2020 which were determined to be due to a stroke.       Physical Exam   Triage Vital Signs: ED Triage Vitals  Encounter Vitals Group     BP 11/26/23 1227 (!) 175/59     Systolic BP Percentile --      Diastolic BP Percentile --      Pulse Rate 11/26/23 1226 67     Resp 11/26/23 1226 20     Temp 11/26/23 1225 97.6 F (36.4 C)     Temp Source 11/26/23 1632 Oral     SpO2 11/26/23 1226 96 %     Weight 11/26/23 1226 250 lb (113.4 kg)     Height 11/26/23 1226 5\' 7"  (1.702 m)     Head Circumference --      Peak Flow --      Pain Score 11/26/23 1226 0     Pain Loc --      Pain Education --      Exclude from Growth Chart --     Most recent vital signs: Vitals:   11/26/23 1227 11/26/23 1632  BP: (!) 175/59   Pulse:    Resp:    Temp:  97.7 F (36.5 C)  SpO2:      General: Awake, no distress.  CV:  Good peripheral perfusion.  Regular rate and rhythm Resp:  Normal effort.  Clear to auscultation bilaterally Abd:  No  distention.  Soft nontender Other:  PERRL, EOMI.  Cranial nerves III through XII intact.  Moving all extremities, no drift.  Symmetric sensation.  Normal language   ED Results / Procedures / Treatments   Labs (all labs ordered are listed, but only abnormal results are displayed) Labs Reviewed  CBC - Abnormal; Notable for the following components:      Result Value   WBC 13.1 (*)    RBC 3.80 (*)    Hemoglobin 11.7 (*)    HCT 34.8 (*)    All other components within normal limits  BASIC METABOLIC PANEL - Abnormal; Notable for the following components:   Chloride 97 (*)    Glucose, Bld 297 (*)    BUN 36 (*)    Creatinine, Ser 3.52 (*)    GFR, Estimated 14 (*)    Anion gap 17 (*)    All other components within normal limits     EKG Interpreted by me Normal sinus rhythm rate of 68.  Normal axis, mildly prolonged QTc.  LVH, poor R wave progression.  Normal ST segments and T waves.   RADIOLOGY MRI brain interpreted by me, no obvious large infarct.  Radiology report reviewed noting acute infarct in the pons.   PROCEDURES:  Procedures   MEDICATIONS ORDERED IN ED: Medications  sodium chloride 0.9 % bolus 1,000 mL (1,000 mLs Intravenous New Bag/Given 11/26/23 1749)  ondansetron (ZOFRAN) injection 4 mg (4 mg Intravenous Given 11/26/23 1744)  LORazepam (ATIVAN) injection 2 mg (2 mg Intravenous Given 11/26/23 1743)     IMPRESSION / MDM / ASSESSMENT AND PLAN / ED COURSE  I reviewed the triage vital signs and the nursing notes.  DDx: Peripheral vertigo, dehydration, electrolyte abnormality, anemia, stroke, vertebral dissection.  Doubt meningitis, glaucoma, intracranial hypertension, dural sinus thrombosis  Patient's presentation is most consistent with acute presentation with potential threat to life or bodily function.  Patient presents with sudden onset of dizziness that started yesterday.  Feels consistent with her peripheral vertigo which has improved with Epley maneuver and  vestibular rehab in the past.  It was onset during turning her head.  Due to her medical history, will obtain MRI/MRA while giving IV fluids, medications for supportive care.    ----------------------------------------- 7:33 PM on 11/26/2023 ----------------------------------------- MRI discussed with radiology who notes there is an acute infarct in the pons.  Will give aspirin, Plavix, plan to admit     FINAL CLINICAL IMPRESSION(S) / ED DIAGNOSES   Final diagnoses:  Acute ischemic stroke (HCC)  Type 2 diabetes mellitus with hyperglycemia, with long-term current use of insulin (HCC)  Morbid obesity (HCC)     Rx / DC Orders   ED Discharge Orders     None        Note:  This document was prepared using Dragon voice recognition software and may include unintentional dictation errors.   Sharman Cheek, MD 11/26/23 Barry Brunner

## 2023-11-26 NOTE — ED Notes (Signed)
Pt sleeping with O2 sat 100% on 2 L via [place].

## 2023-11-26 NOTE — ED Triage Notes (Signed)
Pt in via EMS form home Pt is a dialysis pt and received full treatment yesterday and when she got home she got dizzy. Pt with hx of vertigo and toof meclizine with no relief. Pt was up vomiting all night. CBG 374, HR 68, 173/50, 95% RA, #20g to right AC, pt was give of fluid ad 4mg  of zofran. Pt also has a strong odor.

## 2023-11-26 NOTE — H&P (Signed)
History and Physical    Patient: Sharon Arias:[id number] DOB: 12/25/1960 DOA: 11/26/2023 DOS: the patient was seen and examined on 11/27/2023 PCP: Marguarite Arbour, MD  Patient coming from: Home  Chief Complaint:  Chief Complaint  Patient presents with   Dizziness    HPI: Sharon Arias is a 62 y.o. female with medical history of CVA, essential hypertension, allergies to Trulicity significant for CHF, stage renal disease on hemodialysis, diabetes, hypertension, coming for dizziness that started yesterday at dialysis she was almost done with her dialysis session and she turned her head to look to her right and back to the left and all of a sudden had an acute attack of dizziness with room spinning and blurred vision with also headache, patient has chronic blindness but feels that her vision is not worse.  At bedside patient states that she had no complaints of speech gait incontinence numbness tingling paresthesia any neurological symptoms except for the dizziness no ringing in her ears reported. Patient also reports that she cannot open her eyes because every time she opens her eyes she vomits.  Patient is otherwise doing well. The patient denies amaurosis fugax or recent TIA symptoms. There are no recent neurological changes noted. There is no history of DVT, PE or superficial thrombophlebitis. No recent episodes of angina or shortness of breath documented.  Patient is cooperative and a good historian and knows her health history very well.  States that she has had a heart attack in the past. In the emergency room patient was evaluated initially blood blood work EKG and imaging, patient was given 1 L normal saline and Ativan.  In emergency room vitals trend shows: Vitals:   11/26/23 1945 11/26/23 2000 11/26/23 2037 11/27/23 0005  BP: (!) 175/51 (!) 162/39  (!) 191/65  Pulse: 73 76  (!) 57  Temp:   98.2 F (36.8 C) 98.3 F (36.8 C)  Resp: 18 18  16   Height:      Weight:       SpO2: 97% 100%  96%  TempSrc:   Oral Oral  BMI (Calculated):      EKG done today sinus rhythm 68 with normal axis LVH prolonged QTEKG does show LVH and significant Q waves in the inferior leads QTc is 508 Labs are notable for : -Metabolic panel showing glucose 297 creatinine 3.52 EGFR of 30 anion gap of 17, LFTs added on and pending. - CBC showing leukocytosis of 13.1 hemoglobin of 11.7 and platelet count of 268. - A1c of 8.1 glucose 297.   In the ED pt received: Medications   stroke: early stages of recovery book ( Does not apply Not Given 11/27/23 0110)  0.9 %  sodium chloride infusion ( Intravenous New Bag/Given 11/27/23 0046)  acetaminophen (TYLENOL) tablet 650 mg (has no administration in time range)    Or  acetaminophen (TYLENOL) 160 MG/5ML solution 650 mg (has no administration in time range)    Or  acetaminophen (TYLENOL) suppository 650 mg (has no administration in time range)  heparin injection 5,000 Units (5,000 Units Subcutaneous Given 11/27/23 0038)  atorvastatin (LIPITOR) tablet 40 mg (40 mg Oral Given 11/27/23 0013)  aspirin chewable tablet 81 mg (has no administration in time range)  clopidogrel (PLAVIX) tablet 75 mg (has no administration in time range)  sodium chloride 0.9 % bolus 1,000 mL (0 mLs Intravenous Stopped 11/26/23 2200)  ondansetron (ZOFRAN) injection 4 mg (4 mg Intravenous Given 11/26/23 1744)  LORazepam (ATIVAN) injection 2 mg (2  mg Intravenous Given 11/26/23 1743)  aspirin chewable tablet 81 mg (81 mg Oral Given 11/26/23 2132)  clopidogrel (PLAVIX) tablet 75 mg (75 mg Oral Given 11/26/23 2132)   Review of Systems  Eyes:  Positive for blurred vision.  Neurological:  Positive for dizziness.  All other systems reviewed and are negative.  Past Medical History:  Diagnosis Date   Anemia in chronic kidney disease 05/19/2016   Anxiety    Aortic atherosclerosis (HCC)    Blind    CAD (coronary artery disease)    Cardiac murmur    CHF (congestive heart failure)  (HCC)    G2DD on 02/2020 TTE   Chicken pox    Chronic anticoagulation    CVA (cerebral vascular accident) (HCC)    DDD (degenerative disc disease), lumbar    Detached retina    Diabetic neuropathy (HCC)    Diabetic retinopathy (HCC)    legally blind   Dyspnea    ESRD needing dialysis (HCC)    Hx of CABG    Hyperlipidemia    Hypertension    NSTEMI (non-ST elevated myocardial infarction) (HCC) 06/2012   Obesity    Pneumonia    PONV (postoperative nausea and vomiting)    Sciatica of right side    Sleep apnea    CPAP NIGHTLY   T2DM (type 2 diabetes mellitus) (HCC)    Past Surgical History:  Procedure Laterality Date   A/V FISTULAGRAM Left 04/21/2021   Procedure: A/V FISTULAGRAM;  Surgeon: Renford Dills, MD;  Location: [facility];  Service: Cardiovascular;  Laterality: Left;   A/V FISTULAGRAM Left 11/02/2022   Procedure: A/V Fistulagram;  Surgeon: Renford Dills, MD;  Location: [facility];  Service: Cardiovascular;  Laterality: Left;   AV FISTULA PLACEMENT Left 02/06/2021   Procedure: ARTERIOVENOUS (AV) FISTULA CREATION (BRACHIAL CEPHALIC );  Surgeon: Renford Dills, MD;  Location: [facility];  Service: Vascular;  Laterality: Left;   COLONOSCOPY WITH PROPOFOL N/A 01/26/2016   Procedure: COLONOSCOPY WITH PROPOFOL;  Surgeon: Christena Deem, MD;  Location: [facility];  Service: Endoscopy;  Laterality: N/A;   COLONOSCOPY WITH PROPOFOL N/A 01/27/2016   Procedure: COLONOSCOPY WITH PROPOFOL;  Surgeon: Christena Deem, MD;  Location: [facility];  Service: Endoscopy;  Laterality: N/A;   CORONARY ARTERY BYPASS GRAFT  2013   DIALYSIS/PERMA CATHETER INSERTION N/A 02/03/2021   Procedure: DIALYSIS/PERMA CATHETER INSERTION;  Surgeon: Renford Dills, MD;  Location: [facility];  Service: Cardiovascular;  Laterality: N/A;   DIALYSIS/PERMA CATHETER INSERTION N/A 06/16/2021   Procedure: DIALYSIS/PERMA CATHETER INSERTION;  Surgeon: Annice Needy, MD;   Location: [facility];  Service: Cardiovascular;  Laterality: N/A;   DIALYSIS/PERMA CATHETER REMOVAL N/A 09/22/2021   Procedure: DIALYSIS/PERMA CATHETER REMOVAL;  Surgeon: Renford Dills, MD;  Location: [facility];  Service: Cardiovascular;  Laterality: N/A;   INCISION AND DRAINAGE     chest abscess   INCISION AND DRAINAGE ABSCESS N/A 12/11/2018   Procedure: INCISION AND DRAINAGE PERINEAL;  Surgeon: Leafy Ro, MD;  Location: [facility];  Service: General;  Laterality: N/A;   RETINAL LASER PROCEDURE      reports that she has never smoked. She has never been exposed to tobacco smoke. She has never used smokeless tobacco. She reports that she does not drink alcohol and does not use drugs.  Allergies  Allergen Reactions   Ozempic (0.25 Or 0.5 Mg-Dose) [Semaglutide(0.25 Or 0.5mg -Dos)] Diarrhea and Nausea Only   Trulicity [  Dulaglutide] Diarrhea and Nausea Only   Other     Anesthesia--nausea/vomiting    Family History  Problem Relation Age of Onset   Breast cancer Mother    Prostate cancer Father        we think mets to liver and bone    Prior to Admission medications   Medication Sig Start Date End Date Taking? Authorizing Provider  allopurinol (ZYLOPRIM) 300 MG tablet Take 300 mg by mouth in the morning. 02/09/21   [provider]  amLODipine (NORVASC) 5 MG tablet Take 5 mg by mouth at bedtime. 12/04/18   [provider]  aspirin EC 81 MG tablet Take 81 mg by mouth in the morning.    [provider]  clopidogrel (PLAVIX) 75 MG tablet Take 1 tablet (75 mg total) by mouth daily. Patient taking differently: Take 75 mg by mouth in the morning. 07/23/19   Altamese Dilling, MD  hydrALAZINE (APRESOLINE) 50 MG tablet Take 50 mg by mouth in the morning and at bedtime. 08/21/20   [provider]  Insulin Degludec (TRESIBA) 100 UNIT/ML SOLN Inject 75 mg into the skin at bedtime. 03/05/21   [provider]  insulin lispro  (HUMALOG) 100 UNIT/ML KwikPen Inject 15-40 Units into the skin See admin instructions. Pt. Uses 15 units/ml in the mornings, 30 units/ml in the lunch, and 40 units/ml at [employer] 10/02/18   [provider]  lidocaine-prilocaine (EMLA) cream Apply 1 application topically as needed (prior to dialysis). 05/01/21   [provider]  losartan (COZAAR) 100 MG tablet Take 100 mg by mouth in the morning.    [provider]  meclizine (ANTIVERT) 25 MG tablet Take 1 tablet (25 mg total) by mouth 3 (three) times daily as needed for dizziness. 03/26/23   Merwyn Katos, MD  metoprolol tartrate (LOPRESSOR) 50 MG tablet Take 50 mg by mouth 2 (two) times daily.     [provider]  multivitamin (RENA-VIT) TABS tablet Take 1 tablet by mouth at bedtime. 02/18/21   [provider]  ondansetron (ZOFRAN-ODT) 8 MG disintegrating tablet Take 1 tablet (8 mg total) by mouth every 8 (eight) hours as needed for nausea or vomiting. 03/26/23   Merwyn Katos, MD  ONETOUCH VERIO test strip  01/02/21   [provider]  rosuvastatin (CRESTOR) 20 MG tablet Take 40 mg by mouth at bedtime. 03/13/20   [provider]     Vitals:   11/26/23 1945 11/26/23 2000 11/26/23 2037 11/27/23 0005  BP: (!) 175/51 (!) 162/39  (!) 191/65  Pulse: 73 76  (!) 57  Resp: 18 18  16   Temp:   98.2 F (36.8 C) 98.3 F (36.8 C)  TempSrc:   Oral Oral  SpO2: 97% 100%  96%  Weight:      Height:       Physical Exam Vitals and nursing note reviewed.  Constitutional:      General: She is not in acute distress. HENT:     Head: Normocephalic and atraumatic.     Right Ear: Hearing normal.     Left Ear: Hearing normal.     Nose: Nose normal. No nasal deformity.     Mouth/Throat:     Lips: Pink.     Tongue: No lesions.     Pharynx: Oropharynx is clear.  Eyes:     General: Lids are normal.     Extraocular Movements: Extraocular movements intact.  Neck:     Vascular: No  carotid  bruit.  Cardiovascular:     Rate and Rhythm: Normal rate and regular rhythm.     Heart sounds: Normal heart sounds.  Pulmonary:     Effort: Pulmonary effort is normal.     Breath sounds: Normal breath sounds.  Abdominal:     General: Bowel sounds are normal. There is no distension.     Palpations: Abdomen is soft. There is no mass.     Tenderness: There is no abdominal tenderness.  Musculoskeletal:     Cervical back: No rigidity.     Right lower leg: No edema.     Left lower leg: No edema.  Skin:    General: Skin is warm.  Neurological:     General: No focal deficit present.     Mental Status: She is alert and oriented to person, place, and time.     Cranial Nerves: Cranial nerves 2-12 are intact.  Psychiatric:        Attention and Perception: Attention normal.        Mood and Affect: Mood normal.        Speech: Speech normal.        Behavior: Behavior normal. Behavior is cooperative.     Labs on Admission: I have personally reviewed following labs and imaging studies Results for orders placed or performed during the hospital encounter of 11/26/23 (from the past 24 hour(s))  CBC     Status: Abnormal   Collection Time: 11/26/23 12:28 PM  Result Value Ref Range   WBC 13.1 (H) 4.0 - 10.5 K/uL   RBC 3.80 (L) 3.87 - 5.11 MIL/uL   Hemoglobin 11.7 (L) 12.0 - 15.0 g/dL   HCT 16.6 (L) 06.3 - 01.6 %   MCV 91.6 80.0 - 100.0 fL   MCH 30.8 26.0 - 34.0 pg   MCHC 33.6 30.0 - 36.0 g/dL   RDW 01.0 93.2 - 35.5 %   Platelets 268 150 - 400 K/uL   nRBC 0.0 0.0 - 0.2 %  Basic metabolic panel     Status: Abnormal   Collection Time: 11/26/23 12:28 PM  Result Value Ref Range   Sodium 138 135 - 145 mmol/L   Potassium 3.7 3.5 - 5.1 mmol/L   Chloride 97 (L) 98 - 111 mmol/L   CO2 24 22 - 32 mmol/L   Glucose, Bld 297 (H) 70 - 99 mg/dL   BUN 36 (H) 8 - 23 mg/dL   Creatinine, Ser 7.32 (H) 0.44 - 1.00 mg/dL   Calcium 9.6 8.9 - 20.2 mg/dL   GFR, Estimated 14 (L) >60 mL/min   Anion gap 17 (H) 5 -  15  Hemoglobin A1c     Status: Abnormal   Collection Time: 11/26/23 12:28 PM  Result Value Ref Range   Hgb A1c MFr Bld 8.1 (H) 4.8 - 5.6 %   Mean Plasma Glucose 185.77 mg/dL  Urine Drug Screen, Qualitative ([facility] only)     Status: None   Collection Time: 11/26/23  4:16 PM  Result Value Ref Range   Tricyclic, Ur Screen NONE DETECTED NONE DETECTED   Amphetamines, Ur Screen NONE DETECTED NONE DETECTED   MDMA (Ecstasy)Ur Screen NONE DETECTED NONE DETECTED   Cocaine Metabolite,Ur [place] NONE DETECTED NONE DETECTED   Opiate, Ur Screen NONE DETECTED NONE DETECTED   Phencyclidine (PCP) Ur S NONE DETECTED NONE DETECTED   Cannabinoid 50 Ng, Ur [place] NONE DETECTED NONE DETECTED   Barbiturates, Ur Screen NONE DETECTED NONE DETECTED   Benzodiazepine, Ur Scrn  NONE DETECTED NONE DETECTED   Methadone Scn, Ur NONE DETECTED NONE DETECTED    CBC: Recent Labs  Lab 11/26/23 1228  WBC 13.1*  HGB 11.7*  HCT 34.8*  MCV 91.6  PLT 268   Basic Metabolic Panel: Recent Labs  Lab 11/26/23 1228  NA 138  K 3.7  CL 97*  CO2 24  GLUCOSE 297*  BUN 36*  CREATININE 3.52*  CALCIUM 9.6   GFR: Estimated Creatinine Clearance: 21.5 mL/min (A) (by C-G formula based on SCr of 3.52 mg/dL (H)). Liver Function Tests: No results for input(s): "AST", "ALT", "ALKPHOS", "BILITOT", "PROT", "ALBUMIN" in the last 168 hours. No results for input(s): "LIPASE", "AMYLASE" in the last 168 hours. No results for input(s): "AMMONIA" in the last 168 hours. Coagulation Profile: Recent Labs  Lab 11/26/23 0018  INR 1.3*   Cardiac Enzymes: No results for input(s): "CKTOTAL", "CKMB", "CKMBINDEX", "TROPONINI" in the last 168 hours. BNP (last 3 results) No results for input(s): "PROBNP" in the last 8760 hours. HbA1C: Recent Labs    11/26/23 1228  HGBA1C 8.1*   CBG: No results for input(s): "GLUCAP" in the last 168 hours. Lipid Profile: No results for input(s): "CHOL", "HDL", "LDLCALC", "TRIG", "CHOLHDL", "LDLDIRECT" in the  last 72 hours. Thyroid Function Tests: No results for input(s): "TSH", "T4TOTAL", "FREET4", "T3FREE", "THYROIDAB" in the last 72 hours. Anemia Panel: No results for input(s): "VITAMINB12", "FOLATE", "FERRITIN", "TIBC", "IRON", "RETICCTPCT" in the last 72 hours. Urinalysis    Component Value Date/Time   COLORURINE STRAW (A) 05/12/2020 1409   APPEARANCEUR HAZY (A) 05/12/2020 1409   LABSPEC 1.011 05/12/2020 1409   PHURINE 5.0 05/12/2020 1409   GLUCOSEU 150 (A) 05/12/2020 1409   HGBUR LARGE (A) 05/12/2020 1409   BILIRUBINUR NEGATIVE 05/12/2020 1409   KETONESUR NEGATIVE 05/12/2020 1409   PROTEINUR 100 (A) 05/12/2020 1409   NITRITE NEGATIVE 05/12/2020 1409   LEUKOCYTESUR NEGATIVE 05/12/2020 1409   Unresulted Labs (From admission, onward)     Start     Ordered   11/27/23 0500  Lipid panel  (Labs)  Tomorrow morning,   R       Comments: Fasting    11/26/23 2158   11/26/23 2150  HIV Antibody (routine testing w rflx)  (HIV Antibody (Routine testing w reflex) panel)  Once,   R        11/26/23 2158            Medications   stroke: early stages of recovery book ( Does not apply Not Given 11/27/23 0110)  0.9 %  sodium chloride infusion ( Intravenous New Bag/Given 11/27/23 0046)  acetaminophen (TYLENOL) tablet 650 mg (has no administration in time range)    Or  acetaminophen (TYLENOL) 160 MG/5ML solution 650 mg (has no administration in time range)    Or  acetaminophen (TYLENOL) suppository 650 mg (has no administration in time range)  heparin injection 5,000 Units (5,000 Units Subcutaneous Given 11/27/23 0038)  atorvastatin (LIPITOR) tablet 40 mg (40 mg Oral Given 11/27/23 0013)  aspirin chewable tablet 81 mg (has no administration in time range)  clopidogrel (PLAVIX) tablet 75 mg (has no administration in time range)  sodium chloride 0.9 % bolus 1,000 mL (0 mLs Intravenous Stopped 11/26/23 2200)  ondansetron (ZOFRAN) injection 4 mg (4 mg Intravenous Given 11/26/23 1744)  LORazepam  (ATIVAN) injection 2 mg (2 mg Intravenous Given 11/26/23 1743)  aspirin chewable tablet 81 mg (81 mg Oral Given 11/26/23 2132)  clopidogrel (PLAVIX) tablet 75 mg (75 mg Oral Given  11/26/23 2132)    Radiological Exams on Admission: MR BRAIN WO CONTRAST  Result Date: 11/26/2023 CLINICAL DATA:  Neuro deficit, acute, stroke suspected; Vertebral artery dissection suspected; Vertigo, central EXAM: MRI HEAD WITHOUT CONTRAST MRA HEAD WITHOUT CONTRAST MRA NECK WITHOUT CONTRAST TECHNIQUE: Multiplanar, multi-echo pulse sequences of the brain and surrounding structures were acquired without intravenous contrast. Angiographic images of the Circle of Willis were acquired using MRA technique without intravenous contrast. Angiographic images of the neck were acquired using MRA technique without intravenous contrast. Carotid stenosis measurements (when applicable) are obtained utilizing NASCET criteria, using the distal internal carotid diameter as the denominator. COMPARISON:  None Available. FINDINGS: MRI HEAD FINDINGS Brain: There acute infarcts in the brachium pontis on the left (series 8, image 10). No hemorrhage. No hydrocephalus. No extra-axial fluid collection. No mass effect. No mass lesion. Background of chronic microvascular ischemic change with a chronic infarct in the right parietal lobe. Vascular: Normal flow voids. Skull and upper cervical spine: Normal marrow signal. Sinuses/Orbits: No middle ear or mastoid effusion. Paranasal sinuses are clear. Bilateral lens replacement. Orbits are otherwise unremarkable. Other: None. MRA HEAD FINDINGS Anterior circulation: Severe narrowing in the ophthalmic segment of the right ICA (series 1, image 71). No aneurysm. No vascular malformation. No occlusion. Posterior circulation: No aneurysm. No occlusion. No significant stenosis. Anatomic variants: None MRA NECK FINDINGS Aortic arch: Not visualized, likely to respiratory motion artifact. Right carotid system: The right common  carotid artery in the proximal aspect of the right internal carotid artery are poorly visualized and incompletely assessed due to the degree of respiratory motion artifact. The mid to distal segments of the right ICA are normal in appearance. Left carotid system: The left common carotid artery in the proximal aspect of the left internal carotid artery are poorly visualized and incompletely assessed due to the degree of respiratory motion artifact. The mid to distal segments of the left ICA are normal in appearance. Vertebral arteries: The V1 segments of bilateral vertebral arteries are not visualized due to the degree of respiratory motion artifact. The distal V2 segments in the V4 segments are normal in appearance. Other: None. IMPRESSION: 1. Acute infarct in the brachium pontis on the left. 2. Severe narrowing in the ophthalmic segment of the right ICA. 3. The aortic arch, common carotid arteries, proximal internal carotid arteries, and V1 segments of bilateral vertebral arteries are poorly visualized and incompletely assessed due to the degree of respiratory motion artifact. The mid to distal segments of the carotid and vertebral arteries are normal in appearance. Electronically Signed   By: Lorenza Cambridge M.D.   On: 11/26/2023 19:28   MR ANGIO HEAD WO CONTRAST  Result Date: 11/26/2023 CLINICAL DATA:  Neuro deficit, acute, stroke suspected; Vertebral artery dissection suspected; Vertigo, central EXAM: MRI HEAD WITHOUT CONTRAST MRA HEAD WITHOUT CONTRAST MRA NECK WITHOUT CONTRAST TECHNIQUE: Multiplanar, multi-echo pulse sequences of the brain and surrounding structures were acquired without intravenous contrast. Angiographic images of the Circle of Willis were acquired using MRA technique without intravenous contrast. Angiographic images of the neck were acquired using MRA technique without intravenous contrast. Carotid stenosis measurements (when applicable) are obtained utilizing NASCET criteria, using the  distal internal carotid diameter as the denominator. COMPARISON:  None Available. FINDINGS: MRI HEAD FINDINGS Brain: There acute infarcts in the brachium pontis on the left (series 8, image 10). No hemorrhage. No hydrocephalus. No extra-axial fluid collection. No mass effect. No mass lesion. Background of chronic microvascular ischemic change with a chronic infarct in the  right parietal lobe. Vascular: Normal flow voids. Skull and upper cervical spine: Normal marrow signal. Sinuses/Orbits: No middle ear or mastoid effusion. Paranasal sinuses are clear. Bilateral lens replacement. Orbits are otherwise unremarkable. Other: None. MRA HEAD FINDINGS Anterior circulation: Severe narrowing in the ophthalmic segment of the right ICA (series 1, image 71). No aneurysm. No vascular malformation. No occlusion. Posterior circulation: No aneurysm. No occlusion. No significant stenosis. Anatomic variants: None MRA NECK FINDINGS Aortic arch: Not visualized, likely to respiratory motion artifact. Right carotid system: The right common carotid artery in the proximal aspect of the right internal carotid artery are poorly visualized and incompletely assessed due to the degree of respiratory motion artifact. The mid to distal segments of the right ICA are normal in appearance. Left carotid system: The left common carotid artery in the proximal aspect of the left internal carotid artery are poorly visualized and incompletely assessed due to the degree of respiratory motion artifact. The mid to distal segments of the left ICA are normal in appearance. Vertebral arteries: The V1 segments of bilateral vertebral arteries are not visualized due to the degree of respiratory motion artifact. The distal V2 segments in the V4 segments are normal in appearance. Other: None. IMPRESSION: 1. Acute infarct in the brachium pontis on the left. 2. Severe narrowing in the ophthalmic segment of the right ICA. 3. The aortic arch, common carotid arteries,  proximal internal carotid arteries, and V1 segments of bilateral vertebral arteries are poorly visualized and incompletely assessed due to the degree of respiratory motion artifact. The mid to distal segments of the carotid and vertebral arteries are normal in appearance. Electronically Signed   By: Lorenza Cambridge M.D.   On: 11/26/2023 19:28   MR ANGIO NECK WO CONTRAST  Result Date: 11/26/2023 CLINICAL DATA:  Neuro deficit, acute, stroke suspected; Vertebral artery dissection suspected; Vertigo, central EXAM: MRI HEAD WITHOUT CONTRAST MRA HEAD WITHOUT CONTRAST MRA NECK WITHOUT CONTRAST TECHNIQUE: Multiplanar, multi-echo pulse sequences of the brain and surrounding structures were acquired without intravenous contrast. Angiographic images of the Circle of Willis were acquired using MRA technique without intravenous contrast. Angiographic images of the neck were acquired using MRA technique without intravenous contrast. Carotid stenosis measurements (when applicable) are obtained utilizing NASCET criteria, using the distal internal carotid diameter as the denominator. COMPARISON:  None Available. FINDINGS: MRI HEAD FINDINGS Brain: There acute infarcts in the brachium pontis on the left (series 8, image 10). No hemorrhage. No hydrocephalus. No extra-axial fluid collection. No mass effect. No mass lesion. Background of chronic microvascular ischemic change with a chronic infarct in the right parietal lobe. Vascular: Normal flow voids. Skull and upper cervical spine: Normal marrow signal. Sinuses/Orbits: No middle ear or mastoid effusion. Paranasal sinuses are clear. Bilateral lens replacement. Orbits are otherwise unremarkable. Other: None. MRA HEAD FINDINGS Anterior circulation: Severe narrowing in the ophthalmic segment of the right ICA (series 1, image 71). No aneurysm. No vascular malformation. No occlusion. Posterior circulation: No aneurysm. No occlusion. No significant stenosis. Anatomic variants: None MRA  NECK FINDINGS Aortic arch: Not visualized, likely to respiratory motion artifact. Right carotid system: The right common carotid artery in the proximal aspect of the right internal carotid artery are poorly visualized and incompletely assessed due to the degree of respiratory motion artifact. The mid to distal segments of the right ICA are normal in appearance. Left carotid system: The left common carotid artery in the proximal aspect of the left internal carotid artery are poorly visualized and incompletely assessed due to  the degree of respiratory motion artifact. The mid to distal segments of the left ICA are normal in appearance. Vertebral arteries: The V1 segments of bilateral vertebral arteries are not visualized due to the degree of respiratory motion artifact. The distal V2 segments in the V4 segments are normal in appearance. Other: None. IMPRESSION: 1. Acute infarct in the brachium pontis on the left. 2. Severe narrowing in the ophthalmic segment of the right ICA. 3. The aortic arch, common carotid arteries, proximal internal carotid arteries, and V1 segments of bilateral vertebral arteries are poorly visualized and incompletely assessed due to the degree of respiratory motion artifact. The mid to distal segments of the carotid and vertebral arteries are normal in appearance. Electronically Signed   By: Lorenza Cambridge M.D.   On: 11/26/2023 19:28     Data Reviewed: Relevant notes from primary care and specialist visits, past discharge summaries as available in EHR, including Care Everywhere. Prior diagnostic testing as pertinent to current admission diagnoses Updated medications and problem lists for reconciliation ED course, including vitals, labs, imaging, treatment and response to treatment Triage notes, nursing and pharmacy notes and ED provider's notes Notable results as noted in HPI  Assessment and Plan: * Dizziness 2/2 to 1. Acute infarct in the brachium pontis on the left. 2. Severe  narrowing in the ophthalmic segment of the right ICA. 3. The aortic arch, common carotid arteries, proximal internal carotid arteries, and V1 segments of bilateral vertebral arteries are poorly visualized and incompletely assessed due to the degree of respiratory motion artifact. The mid to distal segments of the carotid and vertebral arteries are normal in appearance. We have continued patient on her baseline antiplatelet regimen with aspirin and Plavix.  Acute CVA (cerebrovascular accident) (HCC) We will continue patient on aspirin and Plavix along with her statin therapy and we will proceed with evaluation with echo and bubble study.  Suspect at this point there may be a cardioembolic source.  Will repeat creatinine today and also obtain CT blood pressure medications angio chest to identify pulmonary embolism.  Home blood pressure medication include amlodipine 5 hydralazine 50 every 12 losartan and metoprolol all held to allow for permissive hypertension.  ESRD (end stage renal disease) on dialysis [facility]) Nephrology consult- Dr.Kolluru.  Resume BP meds.    HTN (hypertension) Vitals:   11/26/23 1227 11/26/23 1945 11/26/23 2000 11/27/23 0005  BP: (!) 175/59 (!) 175/51 (!) 162/39 (!) 191/65  Permissive htn.  Resume home meds 24 hours post.     Type 2 diabetes mellitus with renal complication (HCC) Glycemic protocol, home insulin regimen.  Coronary artery disease Patient has a history of heart disease currently is chest pain-free.  Again we will continue patient on her aspirin Lipitor.  Metoprolol held to allow for permissive hypertension.  Anemia of chronic kidney failure, stage 4 (severe) (HCC)    Latest Ref Rng & Units 11/26/2023   12:28 PM 03/26/2023   10:35 AM 02/06/2021    6:52 AM  CBC  WBC 4.0 - 10.5 K/uL 13.1  9.5    Hemoglobin 12.0 - 15.0 g/dL 16.1  09.6  9.2   Hematocrit 36.0 - 46.0 % 34.8  36.3  27.0   Platelets 150 - 400 K/uL 268  249    H&H is stable, will follow  type and screen IV PPI additional measures as needed.     DVT prophylaxis:  Heparin   Consults:  Neuro: Dr.Stack. Nephro: Dr. Wynelle Link .  Advance Care Planning:    Code  Status: Full Code   Family Communication:  None   Disposition Plan:  Home   Severity of Illness: The appropriate patient status for this patient is INPATIENT. Inpatient status is judged to be reasonable and necessary in order to provide the required intensity of service to ensure the patient's safety. The patient's presenting symptoms, physical exam findings, and initial radiographic and laboratory data in the context of their chronic comorbidities is felt to place them at high risk for further clinical deterioration. Furthermore, it is not anticipated that the patient will be medically stable for discharge from the hospital within 2 midnights of admission.   * I certify that at the point of admission it is my clinical judgment that the patient will require inpatient hospital care spanning beyond 2 midnights from the point of admission due to high intensity of service, high risk for further deterioration and high frequency of surveillance required.*  Author: Gertha Calkin, MD 11/27/2023 4:09 AM  For on call review [email or web address].  Orders Placed This Encounter  Procedures   MR BRAIN WO CONTRAST    Standing Status:   Standing    Number of Occurrences:   1    Order Specific Question:   What is the patient's sedation requirement?    Answer:   No Sedation    Order Specific Question:   Does the patient have a pacemaker or implanted devices?    Answer:   No    Order Specific Question:   Radiology Contrast Protocol - do NOT remove file path    Answer:   [email or web address]   MR ANGIO HEAD WO CONTRAST    Standing Status:   Standing    Number of Occurrences:   1    Order Specific Question:   What is the patient's sedation requirement?    Answer:   No Sedation    Order Specific  Question:   Does the patient have a pacemaker or implanted devices?    Answer:   No    Order Specific Question:   Radiology Contrast Protocol - do NOT remove file path    Answer:   [email or web address]   MR ANGIO NECK WO CONTRAST    Standing Status:   Standing    Number of Occurrences:   1    Order Specific Question:   What is the patient's sedation requirement?    Answer:   No Sedation    Order Specific Question:   Does the patient have a pacemaker or implanted devices?    Answer:   No   DG Chest 1 View    Standing Status:   Standing    Number of Occurrences:   1    Order Specific Question:   Symptom/Reason for Exam    Answer:   Stroke (HCC) [696295]   CBC    Standing Status:   Standing    Number of Occurrences:   1   Basic metabolic panel    Standing Status:   Standing    Number of Occurrences:   1   HIV Antibody (routine testing w rflx)    Standing Status:   Standing    Number of Occurrences:   1   Lipid panel    Fasting    Standing Status:   Standing    Number of Occurrences:   1   Hemoglobin A1c    To assess prior glycemic control    Standing Status:   Standing    Number of Occurrences:   1  APTT    Standing Status:   Standing    Number of Occurrences:   1   Protime-INR    Standing Status:   Standing    Number of Occurrences:   1   Urine Drug Screen, Qualitative ([facility] only)    Standing Status:   Standing    Number of Occurrences:   1   Diet renal/carb modified with fluid restriction Diet-HS Snack? Nothing; Fluid restriction: 1200 mL Fluid; Room service appropriate? Yes; Fluid consistency: Thin    Standing Status:   Standing    Number of Occurrences:   1    Order Specific Question:   Diet-HS Snack?    Answer:   Nothing    Order Specific Question:   Fluid restriction:    Answer:   1200 mL Fluid    Order Specific Question:   Room service appropriate?    Answer:   Yes    Order Specific Question:   Fluid consistency:    Answer:    Thin   Swallow screen    Standing Status:   Standing    Number of Occurrences:   1   NIHSS score documentation NIHSS score range: 0-42    NIHSS score range: 0-42    Standing Status:   Standing    Number of Occurrences:   1   Vital signs    Standing Status:   Standing    Number of Occurrences:   1   Notify physician (specify)    Standing Status:   Standing    Number of Occurrences:   20    Order Specific Question:   Notify Physician    Answer:   change in neurologic status based on Neurologic Worsening protocol    Order Specific Question:   Notify Physician    Answer:   new onset dysrhythmia    Order Specific Question:   Notify Physician    Answer:   Systolic BP > 180 (not controlled by PRN continuous infusion medications) or less than 100    Order Specific Question:   Notify Physician    Answer:   Pox < 88% despite the use of supplemental O2 via nasal cannula    Order Specific Question:   Notify Physician    Answer:   Heart Rate > 120 or less than 50    Order Specific Question:   Notify Physician    Answer:   Respiratory Rate > 30    Order Specific Question:   Notify Physician    Answer:   Blood Glucose > 180mg /dl or less than 60mg /dl    Order Specific Question:   Notify Physician    Answer:   Temperature goal of 37 C (98.6 F). Acceptable range: 36.5 C - 37.5 C (97.7 F - 99.5 F)    Order Specific Question:   Notify Physician    Answer:   Urine output > 300 ml in 1 hour or less than 30 ml in 4 hours (unless anuric)   OOB with assistance    Standing Status:   Standing    Number of Occurrences:   [phone]   Activity as tolerated    Standing Status:   Standing    Number of Occurrences:   1   Swallow screen - If patient does NOT pass this screen, place order for SLP eval and treat (SLP2) - swallowing evaluation (BSE, MBS and/or diet order as indicated)    - If patient does NOT pass this screen, place order for  SLP eval and treat (SLP2) - swallowing evaluation (BSE, MBS and/or diet  order as indicated)    Standing Status:   Standing    Number of Occurrences:   1   NIH stroke scale    Document NIHSS on arrival to unit and each shift    Standing Status:   Standing    Number of Occurrences:   1   Intake and output    Avoid use of indwelling catheter    Standing Status:   Standing    Number of Occurrences:   1   Cardiac Monitoring Continuous x 24 hours Indications for use: Acute neurological event    Standing Status:   Standing    Number of Occurrences:   1    Order Specific Question:   Indications for use:    Answer:   Acute neurological event   Apply Stroke Care Plan: Ischemic Stroke, TIA    Standing Status:   Standing    Number of Occurrences:   1   Discuss with patient and document patient's goals for stroke risk factor reduction    Standing Status:   Standing    Number of Occurrences:   1   Initiate Oral Care Protocol    Standing Status:   Standing    Number of Occurrences:   1   Initiate Carrier Fluid Protocol    Standing Status:   Standing    Number of Occurrences:   1   Provide stroke education material to patient and family.    Standing Status:   Standing    Number of Occurrences:   1   Nurse to provide smoking / tobacco cessation education    Standing Status:   Standing    Number of Occurrences:   1   If the patient has passed the Stroke Swallow Screen or has a feeding tube, then RN may order General Admission PRN Orders (through manage orders) for the following patient needs: allergy symptoms (Claritin), cold sores (Carmex), cough (Robitussin DM), eye irritation (Liquifilm Tears), hemorrhoids (Tucks), indigestion (Maalox), minor skin irritation (hydrocortisone cream), muscle pain Romeo Apple Gay), nose irritation (saline nasal spray) and sore throat (Chloraseptic spray).    Standing Status:   Standing    Number of Occurrences:   [phone]   Full code    Standing Status:   Standing    Number of Occurrences:   1    Order Specific Question:   By:    Answer:    Other   Consult to hospitalist    Standing Status:   Standing    Number of Occurrences:   1    Order Specific Question:   Place call to:    Answer:   ED, [phone]    Order Specific Question:   Reason for Consult    Answer:   Admit    Order Specific Question:   Diagnosis/Clinical Info for Consult:    Answer:   CVA   Consult to Registered Dietitian    Standing Status:   Standing    Number of Occurrences:   1    Order Specific Question:   Reason for consult?    Answer:   Assessment of nutrition requirements/status   Consult to Transition of Care Team    Standing Status:   Standing    Number of Occurrences:   1    Order Specific Question:   Reason for Consult:    Answer:   Home Health / DME Needs  Order Specific Question:   Reason for Consult:    Answer:   SNF placement   Consult to neurology Consult Timeframe: ROUTINE - requires response within 24 hours; Reason for Consult? dizziness and acute cva.    Standing Status:   Standing    Number of Occurrences:   1    Order Specific Question:   Consult Timeframe    Answer:   ROUTINE - requires response within 24 hours    Order Specific Question:   Reason for Consult?    Answer:   dizziness and acute cva.   Consult to nephrology Consult Timeframe: ROUTINE - requires response within 24 hours; Reason for Consult? esrd on HD    Standing Status:   Standing    Number of Occurrences:   1    Order Specific Question:   Consult Timeframe    Answer:   ROUTINE - requires response within 24 hours    Order Specific Question:   Reason for Consult?    Answer:   esrd on HD   OT eval and treat    Standing Status:   Standing    Number of Occurrences:   1   PT eval and treat    Standing Status:   Standing    Number of Occurrences:   1   Oxygen therapy Mode or (Route): Nasal cannula; Liters Per Minute: 2; Keep O2 saturation between: greater than 94 %    Standing Status:   Standing    Number of Occurrences:   1    Order Specific Question:   Mode or  (Route)    Answer:   Nasal cannula    Order Specific Question:   Liters Per Minute    Answer:   2    Order Specific Question:   Keep O2 saturation between    Answer:   greater than 94 %   CPAP continuous - face mask    Standing Status:   Standing    Number of Occurrences:   1   SLP eval and treat Reason for evaluation: Cognitive/Language evaluation    Standing Status:   Standing    Number of Occurrences:   1    Order Specific Question:   Reason for evaluation    Answer:   Cognitive/Language evaluation   ED EKG    Standing Status:   Standing    Number of Occurrences:   1    Order Specific Question:   Reason for Exam    Answer:   Chest Pain   EKG 12-Lead    Standing Status:   Standing    Number of Occurrences:   1   EKG 12-Lead    Standing Status:   Standing    Number of Occurrences:   20    Order Specific Question:   Notes    Answer:   C/P.   ECHOCARDIOGRAM COMPLETE BUBBLE STUDY    Standing Status:   Standing    Number of Occurrences:   1    Order Specific Question:   Perflutren DEFINITY (image enhancing agent) should be administered unless hypersensitivity or allergy exist    Answer:   Administer Perflutren    Order Specific Question:   Will [facility] be the location of this test?    Answer:   Yes    Order Specific Question:   Please indicate who you request to read the nuc med / echo results.    Answer:   [facility]  Order Specific Question:   Reason for exam    Answer:   Stroke 434.91 / I63.9   Place in observation (patient's expected length of stay will be less than 2 midnights)    Standing Status:   Standing    Number of Occurrences:   1    Order Specific Question:   Hospital Area    Answer:   [facility] [[id number]]    Order Specific Question:   Level of Care    Answer:   Progressive [102]    Order Specific Question:   Admit to Progressive based on following criteria    Answer:   NEUROLOGICAL AND NEUROSURGICAL complex patients with  significant risk of instability, who do not meet ICU criteria, yet require close observation or frequent assessment (< / = every 2 - 4 hours) with medical / nursing intervention.    Order Specific Question:   Covid Evaluation    Answer:   Asymptomatic - no recent exposure (last 10 days) testing not required    Order Specific Question:   Diagnosis    Answer:   Dizziness [914782]    Order Specific Question:   Admitting Physician    Answer:   Darrold Junker    Order Specific Question:   Attending Physician    Answer:   Darrold Junker   Fall precautions    Standing Status:   Standing    Number of Occurrences:   1   Aspiration precautions    Standing Status:   Standing    Number of Occurrences:   1

## 2023-11-26 NOTE — ED Notes (Signed)
Pt sleeping and desats to 89% on RA. Arouses easily to name and placed on O2 @ 2L via [place] - pt's reports she uses CPAP at night at home. Sat 96% while awake.

## 2023-11-26 NOTE — ED Notes (Signed)
Respiratory therapist notified of pt need for CPAP and is bringing machine to ER. Pt remains 99% on 2L via [place] at this time while asleep in nad.

## 2023-11-26 NOTE — ED Triage Notes (Signed)
Pt to ED For dizziness started yesterday. Reports has hx of vertigo.

## 2023-11-26 NOTE — ED Notes (Signed)
Dr Allena Katz messaged to inform of pt need for CPAP.

## 2023-11-27 ENCOUNTER — Observation Stay (HOSPITAL_BASED_OUTPATIENT_CLINIC_OR_DEPARTMENT_OTHER)
Admit: 2023-11-27 | Discharge: 2023-11-27 | Disposition: A | Discharge status: Home / Self Care | Payer: Medicare Other | Attending: Internal Medicine | Admitting: Internal Medicine

## 2023-11-27 ENCOUNTER — Observation Stay: Payer: Medicare Other

## 2023-11-27 DIAGNOSIS — I6389 Other cerebral infarction: Secondary | ICD-10-CM | POA: Diagnosis not present

## 2023-11-27 DIAGNOSIS — I639 Cerebral infarction, unspecified: Secondary | ICD-10-CM | POA: Diagnosis not present

## 2023-11-27 DIAGNOSIS — R42 Dizziness and giddiness: Secondary | ICD-10-CM | POA: Diagnosis not present

## 2023-11-27 LAB — CBG MONITORING, ED
Glucose-Capillary: 111 mg/dL — ABNORMAL HIGH (ref 70–99)
Glucose-Capillary: 223 mg/dL — ABNORMAL HIGH (ref 70–99)
Glucose-Capillary: 233 mg/dL — ABNORMAL HIGH (ref 70–99)

## 2023-11-27 LAB — LIPID PANEL
Cholesterol: 141 mg/dL (ref 0–200)
HDL: 42 mg/dL (ref >40)
LDL Cholesterol: 66 mg/dL (ref 0–99)
Total CHOL/HDL Ratio: 3.4 {ratio}
Triglycerides: 167 mg/dL — ABNORMAL HIGH (ref <150)
VLDL: 33 mg/dL (ref 0–40)

## 2023-11-27 LAB — ECHOCARDIOGRAM COMPLETE BUBBLE STUDY
AR max vel: 1.6 cm2
AV Area VTI: 1.8 cm2
AV Area mean vel: 1.71 cm2
AV Mean grad: 3 mm[Hg]
AV Peak grad: 6.7 mm[Hg]
Ao pk vel: 1.29 m/s
Area-P 1/2: 5.16 cm2
MV VTI: 1.22 cm2
S' Lateral: 4 cm

## 2023-11-27 LAB — PROTIME-INR
INR: 1.3 — ABNORMAL HIGH (ref 0.8–1.2)
Prothrombin Time: 16 s — ABNORMAL HIGH (ref 11.4–15.2)

## 2023-11-27 LAB — HEPATITIS B SURFACE ANTIGEN: Hepatitis B Surface Ag: NONREACTIVE

## 2023-11-27 LAB — GLUCOSE, CAPILLARY: Glucose-Capillary: 217 mg/dL — ABNORMAL HIGH (ref 70–99)

## 2023-11-27 LAB — HIV ANTIBODY (ROUTINE TESTING W REFLEX): HIV Screen 4th Generation wRfx: NONREACTIVE

## 2023-11-27 LAB — APTT: aPTT: 32 s (ref 24–36)

## 2023-11-27 MED ORDER — INSULIN DEGLUDEC 100 UNIT/ML ~~LOC~~ SOLN: 75.0000 mg | Freq: Every day | SUBCUTANEOUS | Status: DC

## 2023-11-27 MED ORDER — SEVELAMER CARBONATE 800 MG PO TABS
1600.0000 mg | ORAL_TABLET | Freq: Three times a day (TID) | ORAL | Status: DC
  Administered 2023-11-27: 800 mg via ORAL
  Administered 2023-11-28 (×2): 1600 mg via ORAL
  Administered 2023-11-29: 800 mg via ORAL
  Administered 2023-11-29: 1600 mg via ORAL
  Filled 2023-11-27 (×5): qty 2

## 2023-11-27 MED ORDER — ORAL CARE MOUTH RINSE: 15.0000 mL | OROMUCOSAL | Status: DC | PRN

## 2023-11-27 MED ORDER — MECLIZINE HCL 25 MG PO TABS
25.0000 mg | ORAL_TABLET | Freq: Three times a day (TID) | ORAL | Status: DC | PRN
  Administered 2023-11-27 – 2023-11-29 (×3): 25 mg via ORAL
  Filled 2023-11-27 (×4): qty 1

## 2023-11-27 MED ORDER — INSULIN GLARGINE-YFGN 100 UNIT/ML ~~LOC~~ SOLN
40.0000 [IU] | Freq: Every day | SUBCUTANEOUS | Status: DC
  Administered 2023-11-27 – 2023-11-30 (×4): 40 [IU] via SUBCUTANEOUS
  Filled 2023-11-27 (×6): qty 0.4

## 2023-11-27 MED ORDER — ONDANSETRON HCL 4 MG/2ML IJ SOLN
4.0000 mg | Freq: Once | INTRAMUSCULAR | Status: AC | PRN
  Administered 2023-11-27: 4 mg via INTRAVENOUS
  Filled 2023-11-27: qty 2

## 2023-11-27 MED ORDER — INSULIN GLARGINE-YFGN 100 UNIT/ML ~~LOC~~ SOLN: 50.0000 [IU] | Freq: Every day | SUBCUTANEOUS | Status: DC

## 2023-11-27 MED ORDER — INSULIN ASPART 100 UNIT/ML IJ SOLN
20.0000 [IU] | Freq: Three times a day (TID) | INTRAMUSCULAR | Status: DC
  Administered 2023-11-28 – 2023-12-01 (×8): 20 [IU] via SUBCUTANEOUS
  Filled 2023-11-27 (×10): qty 1

## 2023-11-27 MED ORDER — ALLOPURINOL 100 MG PO TABS
300.0000 mg | ORAL_TABLET | Freq: Every morning | ORAL | Status: DC
  Administered 2023-11-28 – 2023-12-02 (×5): 300 mg via ORAL
  Filled 2023-11-27 (×6): qty 3

## 2023-11-27 NOTE — Assessment & Plan Note (Signed)
    Latest Ref Rng & Units 11/26/2023   12:28 PM 03/26/2023   10:35 AM 02/06/2021    6:52 AM  CBC  WBC 4.0 - 10.5 K/uL 13.1  9.5    Hemoglobin 12.0 - 15.0 g/dL 93.2  35.5  9.2   Hematocrit 36.0 - 46.0 % 34.8  36.3  27.0   Platelets 150 - 400 K/uL 268  249    H&H is stable, will follow type and screen IV PPI additional measures as needed.

## 2023-11-27 NOTE — Progress Notes (Signed)
OT Cancellation Note  Patient Details Name: Sharon Arias MRN: [id number] DOB: 1961-06-22   Cancelled Treatment:    Reason Eval/Treat Not Completed: Medical issues which prohibited therapy. RN stating that pt is very dizzy; only able to be improved with one eye open; RN stating pt should not get out of bed at this time. Will hold until more stable.  Alvester Morin 11/27/2023, 2:39 PM

## 2023-11-27 NOTE — Progress Notes (Signed)
  Echocardiogram 2D Echocardiogram has been performed.  Sharon Arias 11/27/2023, 12:46 PM

## 2023-11-27 NOTE — Progress Notes (Signed)
SLP Cancellation Note  Patient Details Name: Sharon Arias MRN: [id number] DOB: Dec 02, 1961   Cancelled treatment:       Reason Eval/Treat Not Completed: Medical issues which prohibited therapy (Upon entrance to room, pt reporting new onset of "numbness like I just had dental work" to mouth. L facial droop and mild dysarthria appreciated. This was not documented in NIHSS. Contacted RN regarding possible neuro change. Will defer speech/language eval at this time.)  Clyde Canterbury, M.S., CCC-SLP Speech-Language Pathologist [facility] [phone] Arnette Felts)  Woodroe Chen 11/27/2023, 9:35 AM

## 2023-11-27 NOTE — ED Notes (Signed)
Speech called this RN about observing drooping to the left side of the face. Per earlier assessment pt did show a slight droop around the mouth on the left side. Pt left side is more drooped than what was assessed earlier by this RN. NIH 0. MD made aware.

## 2023-11-27 NOTE — Assessment & Plan Note (Addendum)
Nephrology consult- Dr.Kolluru.  Resume BP meds.

## 2023-11-27 NOTE — Assessment & Plan Note (Signed)
Vitals:   11/26/23 1227 11/26/23 1945 11/26/23 2000 11/27/23 0005  BP: (!) 175/59 (!) 175/51 (!) 162/39 (!) 191/65  Permissive htn.  Resume home meds 24 hours post.

## 2023-11-27 NOTE — Progress Notes (Signed)
Progress Note    Sharon Arias  ZDG:[id number] DOB: June 01, 1961  DOA: 11/26/2023 PCP: Marguarite Arbour, MD      Brief Narrative:    Medical records reviewed and are as summarized below:  Sharon Arias is a 62 y.o. female with medical history significant for history of stroke, CAD s/p CABG and coronary stent on aspirin and Plavix, chronic diastolic CHF, ESRD on hemodialysis, type II DM, diabetic neuropathy, blindness, anemia of chronic disease, sleep apnea, obesity.  She presented to the hospital because of nausea, vomiting and dizziness.  She describes the dizziness as spinning sensation, as if the room is spinning.  She felt dizzy on 11/25/2023 around 10 AM.  She did not seek medical attention because she thought she would be okay.  She went for hemodialysis that same day.  However, when she got home, her symptoms had worsened.  She had nausea and multiple episodes of vomiting.  She presented to the hospital the following day because of worsening symptoms.   She was found to have acute stroke.    Assessment/Plan:   Principal Problem:   Dizziness Active Problems:   Acute CVA (cerebrovascular accident) (HCC)   Anemia of chronic kidney failure, stage 4 (severe) (HCC)   Coronary artery disease   Type 2 diabetes mellitus with renal complication (HCC)   HTN (hypertension)   ESRD (end stage renal disease) on dialysis (HCC)    Body mass index is 39.16 kg/m.  (Obesity)   Acute ischemic stroke in the left brachium pontis with vomiting and vertigo, severe narrowing in the ophthalmic segment of the right ICA: Continue aspirin, Plavix and statin.  Antiemetics as needed.  Continue meclizine.  2D echo is pending. Follow-up with neurologist.   ESRD: Nephrologist has been consulted for hemodialysis Discontinue IV fluids   Chronic diastolic CHF: Compensated   CAD s/p CABG and coronary stent: Continue aspirin and Plavix   Type 2 DM with hyperglycemia:NovoLog as needed for  hyperglycemia.  Start insulin glargine at 40 units nightly and NovoLog 20 units 3 times daily with meals.  Hemoglobin A1c 8.1 Home insulin regimen: Tresiba 75 mg nightly, Humalog 20 units in the morning, 42 units at lunch and 52 units at dinner.   Hyperlipidemia: Continue Lipitor Lipid panel showed LDL 66, HDL 42, triglycerides 167, total cholesterol 141   Hypertension: Allow permissive hypertension for now.  Hold hydralazine, losartan, metoprolol and amlodipine   Comorbidities include gout, anemia of chronic disease, sleep apnea,    Diet Order             Diet renal/carb modified with fluid restriction Diet-HS Snack? Nothing; Fluid restriction: 1200 mL Fluid; Room service appropriate? Yes; Fluid consistency: Thin  Diet effective now                            Consultants: Nephrologist Neurologist  Procedures: None    Medications:     stroke: early stages of recovery book   Does not apply Once   [START ON 11/28/2023] allopurinol  300 mg Oral q AM   aspirin  81 mg Oral Daily   atorvastatin  40 mg Oral Daily   Chlorhexidine Gluconate Cloth  6 each Topical Q0600   clopidogrel  75 mg Oral Daily   heparin  5,000 Units Subcutaneous Q8H   insulin aspart  20 Units Subcutaneous TID WC   insulin glargine-yfgn  40 Units Subcutaneous QHS   sevelamer carbonate  1,600 mg Oral TID   Continuous Infusions:     Anti-infectives (From admission, onward)    None              Family Communication/Anticipated D/C date and plan/Code Status   DVT prophylaxis: heparin injection 5,000 Units Start: 11/26/23 2200     Code Status: Full Code  Family Communication: None Disposition Plan: Plan to discharge home   Status is: Observation The patient will require care spanning > 2 midnights and should be moved to inpatient because: Acute stroke       Subjective:   Interval events noted.  She complains of nausea and dizziness.  "I can't open my eyes".  She  said that opening her eyes makes the dizziness worse.  She feels as though the room is spinning around her.  She is unable to stand because of dizziness.  No unilateral weakness or numbness in her extremities.  No changes in speech or vision.  Objective:    Vitals:   11/27/23 0950 11/27/23 1120 11/27/23 1300 11/27/23 1315  BP: (!) 168/59  (!) 124/112   Pulse: 64  67 61  Resp: 13  18 13   Temp:  (!) 97.5 F (36.4 C)    TempSrc:  Oral    SpO2: 97%  96% 92%  Weight:      Height:       No data found.   Intake/Output Summary (Last 24 hours) at 11/27/2023 1331 Last data filed at 11/26/2023 2200 Gross per 24 hour  Intake 1100 ml  Output --  Net 1100 ml   Filed Weights   11/26/23 1226  Weight: 113.4 kg    Exam:  GEN: NAD SKIN: Warm and dry EYES: EOMI, PERRLA, no nystagmus, no pallor or icterus ENT: MMM CV: RRR PULM: CTA B ABD: soft, obese , NT, +BS CNS: AAO x 3, no focal deficits EXT: No edema or tenderness        Data Reviewed:   I have personally reviewed following labs and imaging studies:  Labs: Labs show the following:   Basic Metabolic Panel: Recent Labs  Lab 11/26/23 1228  NA 138  K 3.7  CL 97*  CO2 24  GLUCOSE 297*  BUN 36*  CREATININE 3.52*  CALCIUM 9.6   GFR Estimated Creatinine Clearance: 21.5 mL/min (A) (by C-G formula based on SCr of 3.52 mg/dL (H)). Liver Function Tests: No results for input(s): "AST", "ALT", "ALKPHOS", "BILITOT", "PROT", "ALBUMIN" in the last 168 hours. No results for input(s): "LIPASE", "AMYLASE" in the last 168 hours. No results for input(s): "AMMONIA" in the last 168 hours. Coagulation profile Recent Labs  Lab 11/26/23 0018  INR 1.3*    CBC: Recent Labs  Lab 11/26/23 1228  WBC 13.1*  HGB 11.7*  HCT 34.8*  MCV 91.6  PLT 268   Cardiac Enzymes: No results for input(s): "CKTOTAL", "CKMB", "CKMBINDEX", "TROPONINI" in the last 168 hours. BNP (last 3 results) No results for input(s): "PROBNP" in the last  8760 hours. CBG: Recent Labs  Lab 11/27/23 1108 11/27/23 1124  GLUCAP 111* 223*   D-Dimer: No results for input(s): "DDIMER" in the last 72 hours. Hgb A1c: Recent Labs    11/26/23 1228  HGBA1C 8.1*   Lipid Profile: Recent Labs    11/27/23 0454  CHOL 141  HDL 42  LDLCALC 66  TRIG 167*  CHOLHDL 3.4   Thyroid function studies: No results for input(s): "TSH", "T4TOTAL", "T3FREE", "THYROIDAB" in the last 72 hours.  Invalid input(s): "  FREET3" Anemia work up: No results for input(s): "VITAMINB12", "FOLATE", "FERRITIN", "TIBC", "IRON", "RETICCTPCT" in the last 72 hours. Sepsis Labs: Recent Labs  Lab 11/26/23 1228  WBC 13.1*    Microbiology No results found for this or any previous visit (from the past 240 hour(s)).  Procedures and diagnostic studies:  DG Chest 1 View  Result Date: 11/27/2023 CLINICAL DATA:  62 year old female with left brainstem infarct. EXAM: CHEST  1 VIEW COMPARISON:  Chest radiographs 02/05/2021. FINDINGS: Portable AP semi upright view at 0421 hours. More lordotic positioning. Chronic sternotomy, CABG. Stable mild cardiomegaly mediastinal contours. Visualized tracheal air column is within normal limits. Allowing for portable technique the lungs are clear. No pneumothorax or pleural effusion. Paucity of bowel gas. No acute osseous abnormality identified. IMPRESSION: Chronic cardiac surgery.  No acute cardiopulmonary abnormality. Electronically Signed   By: Odessa Fleming M.D.   On: 11/27/2023 05:31   MR BRAIN WO CONTRAST  Result Date: 11/26/2023 CLINICAL DATA:  Neuro deficit, acute, stroke suspected; Vertebral artery dissection suspected; Vertigo, central EXAM: MRI HEAD WITHOUT CONTRAST MRA HEAD WITHOUT CONTRAST MRA NECK WITHOUT CONTRAST TECHNIQUE: Multiplanar, multi-echo pulse sequences of the brain and surrounding structures were acquired without intravenous contrast. Angiographic images of the Circle of Willis were acquired using MRA technique without  intravenous contrast. Angiographic images of the neck were acquired using MRA technique without intravenous contrast. Carotid stenosis measurements (when applicable) are obtained utilizing NASCET criteria, using the distal internal carotid diameter as the denominator. COMPARISON:  None Available. FINDINGS: MRI HEAD FINDINGS Brain: There acute infarcts in the brachium pontis on the left (series 8, image 10). No hemorrhage. No hydrocephalus. No extra-axial fluid collection. No mass effect. No mass lesion. Background of chronic microvascular ischemic change with a chronic infarct in the right parietal lobe. Vascular: Normal flow voids. Skull and upper cervical spine: Normal marrow signal. Sinuses/Orbits: No middle ear or mastoid effusion. Paranasal sinuses are clear. Bilateral lens replacement. Orbits are otherwise unremarkable. Other: None. MRA HEAD FINDINGS Anterior circulation: Severe narrowing in the ophthalmic segment of the right ICA (series 1, image 71). No aneurysm. No vascular malformation. No occlusion. Posterior circulation: No aneurysm. No occlusion. No significant stenosis. Anatomic variants: None MRA NECK FINDINGS Aortic arch: Not visualized, likely to respiratory motion artifact. Right carotid system: The right common carotid artery in the proximal aspect of the right internal carotid artery are poorly visualized and incompletely assessed due to the degree of respiratory motion artifact. The mid to distal segments of the right ICA are normal in appearance. Left carotid system: The left common carotid artery in the proximal aspect of the left internal carotid artery are poorly visualized and incompletely assessed due to the degree of respiratory motion artifact. The mid to distal segments of the left ICA are normal in appearance. Vertebral arteries: The V1 segments of bilateral vertebral arteries are not visualized due to the degree of respiratory motion artifact. The distal V2 segments in the V4 segments  are normal in appearance. Other: None. IMPRESSION: 1. Acute infarct in the brachium pontis on the left. 2. Severe narrowing in the ophthalmic segment of the right ICA. 3. The aortic arch, common carotid arteries, proximal internal carotid arteries, and V1 segments of bilateral vertebral arteries are poorly visualized and incompletely assessed due to the degree of respiratory motion artifact. The mid to distal segments of the carotid and vertebral arteries are normal in appearance. Electronically Signed   By: Lorenza Cambridge M.D.   On: 11/26/2023 19:28   MR  ANGIO HEAD WO CONTRAST  Result Date: 11/26/2023 CLINICAL DATA:  Neuro deficit, acute, stroke suspected; Vertebral artery dissection suspected; Vertigo, central EXAM: MRI HEAD WITHOUT CONTRAST MRA HEAD WITHOUT CONTRAST MRA NECK WITHOUT CONTRAST TECHNIQUE: Multiplanar, multi-echo pulse sequences of the brain and surrounding structures were acquired without intravenous contrast. Angiographic images of the Circle of Willis were acquired using MRA technique without intravenous contrast. Angiographic images of the neck were acquired using MRA technique without intravenous contrast. Carotid stenosis measurements (when applicable) are obtained utilizing NASCET criteria, using the distal internal carotid diameter as the denominator. COMPARISON:  None Available. FINDINGS: MRI HEAD FINDINGS Brain: There acute infarcts in the brachium pontis on the left (series 8, image 10). No hemorrhage. No hydrocephalus. No extra-axial fluid collection. No mass effect. No mass lesion. Background of chronic microvascular ischemic change with a chronic infarct in the right parietal lobe. Vascular: Normal flow voids. Skull and upper cervical spine: Normal marrow signal. Sinuses/Orbits: No middle ear or mastoid effusion. Paranasal sinuses are clear. Bilateral lens replacement. Orbits are otherwise unremarkable. Other: None. MRA HEAD FINDINGS Anterior circulation: Severe narrowing in the  ophthalmic segment of the right ICA (series 1, image 71). No aneurysm. No vascular malformation. No occlusion. Posterior circulation: No aneurysm. No occlusion. No significant stenosis. Anatomic variants: None MRA NECK FINDINGS Aortic arch: Not visualized, likely to respiratory motion artifact. Right carotid system: The right common carotid artery in the proximal aspect of the right internal carotid artery are poorly visualized and incompletely assessed due to the degree of respiratory motion artifact. The mid to distal segments of the right ICA are normal in appearance. Left carotid system: The left common carotid artery in the proximal aspect of the left internal carotid artery are poorly visualized and incompletely assessed due to the degree of respiratory motion artifact. The mid to distal segments of the left ICA are normal in appearance. Vertebral arteries: The V1 segments of bilateral vertebral arteries are not visualized due to the degree of respiratory motion artifact. The distal V2 segments in the V4 segments are normal in appearance. Other: None. IMPRESSION: 1. Acute infarct in the brachium pontis on the left. 2. Severe narrowing in the ophthalmic segment of the right ICA. 3. The aortic arch, common carotid arteries, proximal internal carotid arteries, and V1 segments of bilateral vertebral arteries are poorly visualized and incompletely assessed due to the degree of respiratory motion artifact. The mid to distal segments of the carotid and vertebral arteries are normal in appearance. Electronically Signed   By: Lorenza Cambridge M.D.   On: 11/26/2023 19:28   MR ANGIO NECK WO CONTRAST  Result Date: 11/26/2023 CLINICAL DATA:  Neuro deficit, acute, stroke suspected; Vertebral artery dissection suspected; Vertigo, central EXAM: MRI HEAD WITHOUT CONTRAST MRA HEAD WITHOUT CONTRAST MRA NECK WITHOUT CONTRAST TECHNIQUE: Multiplanar, multi-echo pulse sequences of the brain and surrounding structures were acquired  without intravenous contrast. Angiographic images of the Circle of Willis were acquired using MRA technique without intravenous contrast. Angiographic images of the neck were acquired using MRA technique without intravenous contrast. Carotid stenosis measurements (when applicable) are obtained utilizing NASCET criteria, using the distal internal carotid diameter as the denominator. COMPARISON:  None Available. FINDINGS: MRI HEAD FINDINGS Brain: There acute infarcts in the brachium pontis on the left (series 8, image 10). No hemorrhage. No hydrocephalus. No extra-axial fluid collection. No mass effect. No mass lesion. Background of chronic microvascular ischemic change with a chronic infarct in the right parietal lobe. Vascular: Normal flow voids. Skull and  upper cervical spine: Normal marrow signal. Sinuses/Orbits: No middle ear or mastoid effusion. Paranasal sinuses are clear. Bilateral lens replacement. Orbits are otherwise unremarkable. Other: None. MRA HEAD FINDINGS Anterior circulation: Severe narrowing in the ophthalmic segment of the right ICA (series 1, image 71). No aneurysm. No vascular malformation. No occlusion. Posterior circulation: No aneurysm. No occlusion. No significant stenosis. Anatomic variants: None MRA NECK FINDINGS Aortic arch: Not visualized, likely to respiratory motion artifact. Right carotid system: The right common carotid artery in the proximal aspect of the right internal carotid artery are poorly visualized and incompletely assessed due to the degree of respiratory motion artifact. The mid to distal segments of the right ICA are normal in appearance. Left carotid system: The left common carotid artery in the proximal aspect of the left internal carotid artery are poorly visualized and incompletely assessed due to the degree of respiratory motion artifact. The mid to distal segments of the left ICA are normal in appearance. Vertebral arteries: The V1 segments of bilateral vertebral  arteries are not visualized due to the degree of respiratory motion artifact. The distal V2 segments in the V4 segments are normal in appearance. Other: None. IMPRESSION: 1. Acute infarct in the brachium pontis on the left. 2. Severe narrowing in the ophthalmic segment of the right ICA. 3. The aortic arch, common carotid arteries, proximal internal carotid arteries, and V1 segments of bilateral vertebral arteries are poorly visualized and incompletely assessed due to the degree of respiratory motion artifact. The mid to distal segments of the carotid and vertebral arteries are normal in appearance. Electronically Signed   By: Lorenza Cambridge M.D.   On: 11/26/2023 19:28               LOS: 0 days   Kasia Trego  [facility]   Pager on [email or web address]. If 7PM-7AM, please contact night-coverage at [email or web address]     11/27/2023, 1:31 PM

## 2023-11-27 NOTE — Progress Notes (Signed)
[facility]  ROUNDING NOTE   Subjective:   [name] was admitted to [facility] on 11/26/2023 for Dizziness [R42]  Last hemodialysis treatment was 12/6. Completed her treatment. However patient felt dizzy after treatment.   Patient was having nausea and vomiting whenever she opened her eyes. She then came to the ED for further evaluation. MRI shows left pons acute stroke.   Objective:  Vital signs in last 24 hours:  Temp:  [97.6 F (36.4 C)-98.3 F (36.8 C)] 98.1 F (36.7 C) (12/08 0700) Pulse Rate:  [55-76] 64 (12/08 0950) Resp:  [10-20] 13 (12/08 0950) BP: (137-191)/(39-76) 168/59 (12/08 0950) SpO2:  [79 %-100 %] 97 % (12/08 0950) Weight:  [113.4 kg] 113.4 kg (12/07 1226)  Weight change:  Filed Weights   11/26/23 1226  Weight: 113.4 kg    Intake/Output: I/O last 3 completed shifts: In: 1100 [P.O.:100; IV Piggyback:1000] Out: -    Intake/Output this shift:  No intake/output data recorded.  Physical Exam: General: NAD,   Head: Normocephalic, atraumatic. Moist oral mucosal membranes  Eyes: Left eye covered  Neck: Supple, trachea midline  Lungs:  Clear to auscultation  Heart: Regular rate and rhythm  Abdomen:  Soft, nontender,   Extremities:  no peripheral edema.  Neurologic: Nonfocal, moving all four extremities  Skin: No lesions  Access: Left AVF    Basic Metabolic Panel: Recent Labs  Lab 11/26/23 1228  NA 138  K 3.7  CL 97*  CO2 24  GLUCOSE 297*  BUN 36*  CREATININE 3.52*  CALCIUM 9.6    Liver Function Tests: No results for input(s): "AST", "ALT", "ALKPHOS", "BILITOT", "PROT", "ALBUMIN" in the last 168 hours. No results for input(s): "LIPASE", "AMYLASE" in the last 168 hours. No results for input(s): "AMMONIA" in the last 168 hours.  CBC: Recent Labs  Lab 11/26/23 1228  WBC 13.1*  HGB 11.7*  HCT 34.8*  MCV 91.6  PLT 268    Cardiac Enzymes: No results for input(s): "CKTOTAL", "CKMB", "CKMBINDEX", "TROPONINI" in the last  168 hours.  BNP: Invalid input(s): "POCBNP"  CBG: Recent Labs  Lab 11/27/23 1108  GLUCAP 111*    Microbiology: Results for orders placed or performed during the hospital encounter of 04/17/21  SARS CORONAVIRUS 2 (TAT 6-24 HRS) Nasopharyngeal Nasopharyngeal Swab     Status: None   Collection Time: 04/17/21 10:12 AM   Specimen: Nasopharyngeal Swab  Result Value Ref Range Status   SARS Coronavirus 2 NEGATIVE NEGATIVE Final    Comment: (NOTE) SARS-CoV-2 target nucleic acids are NOT DETECTED.  The SARS-CoV-2 RNA is generally detectable in upper and lower respiratory specimens during the acute phase of infection. Negative results do not preclude SARS-CoV-2 infection, do not rule out co-infections with other pathogens, and should not be used as the sole basis for treatment or other patient management decisions. Negative results must be combined with clinical observations, patient history, and epidemiological information. The expected result is Negative.  Fact Sheet for Patients: HairSlick.no  Fact Sheet for Healthcare Providers: quierodirigir.com  This test is not yet approved or cleared by the [place] FDA and  has been authorized for detection and/or diagnosis of SARS-CoV-2 by FDA under an Emergency Use Authorization (EUA). This EUA will remain  in effect (meaning this test can be used) for the duration of the COVID-19 declaration under Se ction 564(b)(1) of the Act, 21 U.S.C. section 360bbb-3(b)(1), unless the authorization is terminated or revoked sooner.  Performed at [facility], [address], [place], [place] [address]     Coagulation Studies: Recent Labs    11/26/23 0018  LABPROT 16.0*  INR 1.3*    Urinalysis: No results for input(s): "COLORURINE", "LABSPEC", "PHURINE", "GLUCOSEU", "HGBUR", "BILIRUBINUR", "KETONESUR", "PROTEINUR", "UROBILINOGEN", "NITRITE", "LEUKOCYTESUR" in the last 72  hours.  Invalid input(s): "APPERANCEUR"    Imaging: DG Chest 1 View  Result Date: 11/27/2023 CLINICAL DATA:  62 year old female with left brainstem infarct. EXAM: CHEST  1 VIEW COMPARISON:  Chest radiographs 02/05/2021. FINDINGS: Portable AP semi upright view at 0421 hours. More lordotic positioning. Chronic sternotomy, CABG. Stable mild cardiomegaly mediastinal contours. Visualized tracheal air column is within normal limits. Allowing for portable technique the lungs are clear. No pneumothorax or pleural effusion. Paucity of bowel gas. No acute osseous abnormality identified. IMPRESSION: Chronic cardiac surgery.  No acute cardiopulmonary abnormality. Electronically Signed   By: Odessa Fleming M.D.   On: 11/27/2023 05:31   MR BRAIN WO CONTRAST  Result Date: 11/26/2023 CLINICAL DATA:  Neuro deficit, acute, stroke suspected; Vertebral artery dissection suspected; Vertigo, central EXAM: MRI HEAD WITHOUT CONTRAST MRA HEAD WITHOUT CONTRAST MRA NECK WITHOUT CONTRAST TECHNIQUE: Multiplanar, multi-echo pulse sequences of the brain and surrounding structures were acquired without intravenous contrast. Angiographic images of the Circle of Willis were acquired using MRA technique without intravenous contrast. Angiographic images of the neck were acquired using MRA technique without intravenous contrast. Carotid stenosis measurements (when applicable) are obtained utilizing NASCET criteria, using the distal internal carotid diameter as the denominator. COMPARISON:  None Available. FINDINGS: MRI HEAD FINDINGS Brain: There acute infarcts in the brachium pontis on the left (series 8, image 10). No hemorrhage. No hydrocephalus. No extra-axial fluid collection. No mass effect. No mass lesion. Background of chronic microvascular ischemic change with a chronic infarct in the right parietal lobe. Vascular: Normal flow voids. Skull and upper cervical spine: Normal marrow signal. Sinuses/Orbits: No middle ear or mastoid effusion.  Paranasal sinuses are clear. Bilateral lens replacement. Orbits are otherwise unremarkable. Other: None. MRA HEAD FINDINGS Anterior circulation: Severe narrowing in the ophthalmic segment of the right ICA (series 1, image 71). No aneurysm. No vascular malformation. No occlusion. Posterior circulation: No aneurysm. No occlusion. No significant stenosis. Anatomic variants: None MRA NECK FINDINGS Aortic arch: Not visualized, likely to respiratory motion artifact. Right carotid system: The right common carotid artery in the proximal aspect of the right internal carotid artery are poorly visualized and incompletely assessed due to the degree of respiratory motion artifact. The mid to distal segments of the right ICA are normal in appearance. Left carotid system: The left common carotid artery in the proximal aspect of the left internal carotid artery are poorly visualized and incompletely assessed due to the degree of respiratory motion artifact. The mid to distal segments of the left ICA are normal in appearance. Vertebral arteries: The V1 segments of bilateral vertebral arteries are not visualized due to the degree of respiratory motion artifact. The distal V2 segments in the V4 segments are normal in appearance. Other: None. IMPRESSION: 1. Acute infarct in the brachium pontis on the left. 2. Severe narrowing in the ophthalmic segment of the right ICA. 3. The aortic arch, common carotid arteries, proximal internal carotid arteries, and V1 segments of bilateral vertebral arteries are poorly visualized and incompletely assessed due to the degree of respiratory motion artifact. The mid to distal segments of the carotid and vertebral arteries are normal in appearance. Electronically Signed   By: Lorenza Cambridge M.D.   On: 11/26/2023 19:28  MR ANGIO HEAD WO CONTRAST  Result Date: 11/26/2023 CLINICAL DATA:  Neuro deficit, acute, stroke suspected; Vertebral artery dissection suspected; Vertigo, central EXAM: MRI HEAD  WITHOUT CONTRAST MRA HEAD WITHOUT CONTRAST MRA NECK WITHOUT CONTRAST TECHNIQUE: Multiplanar, multi-echo pulse sequences of the brain and surrounding structures were acquired without intravenous contrast. Angiographic images of the Circle of Willis were acquired using MRA technique without intravenous contrast. Angiographic images of the neck were acquired using MRA technique without intravenous contrast. Carotid stenosis measurements (when applicable) are obtained utilizing NASCET criteria, using the distal internal carotid diameter as the denominator. COMPARISON:  None Available. FINDINGS: MRI HEAD FINDINGS Brain: There acute infarcts in the brachium pontis on the left (series 8, image 10). No hemorrhage. No hydrocephalus. No extra-axial fluid collection. No mass effect. No mass lesion. Background of chronic microvascular ischemic change with a chronic infarct in the right parietal lobe. Vascular: Normal flow voids. Skull and upper cervical spine: Normal marrow signal. Sinuses/Orbits: No middle ear or mastoid effusion. Paranasal sinuses are clear. Bilateral lens replacement. Orbits are otherwise unremarkable. Other: None. MRA HEAD FINDINGS Anterior circulation: Severe narrowing in the ophthalmic segment of the right ICA (series 1, image 71). No aneurysm. No vascular malformation. No occlusion. Posterior circulation: No aneurysm. No occlusion. No significant stenosis. Anatomic variants: None MRA NECK FINDINGS Aortic arch: Not visualized, likely to respiratory motion artifact. Right carotid system: The right common carotid artery in the proximal aspect of the right internal carotid artery are poorly visualized and incompletely assessed due to the degree of respiratory motion artifact. The mid to distal segments of the right ICA are normal in appearance. Left carotid system: The left common carotid artery in the proximal aspect of the left internal carotid artery are poorly visualized and incompletely assessed due to  the degree of respiratory motion artifact. The mid to distal segments of the left ICA are normal in appearance. Vertebral arteries: The V1 segments of bilateral vertebral arteries are not visualized due to the degree of respiratory motion artifact. The distal V2 segments in the V4 segments are normal in appearance. Other: None. IMPRESSION: 1. Acute infarct in the brachium pontis on the left. 2. Severe narrowing in the ophthalmic segment of the right ICA. 3. The aortic arch, common carotid arteries, proximal internal carotid arteries, and V1 segments of bilateral vertebral arteries are poorly visualized and incompletely assessed due to the degree of respiratory motion artifact. The mid to distal segments of the carotid and vertebral arteries are normal in appearance. Electronically Signed   By: Lorenza Cambridge M.D.   On: 11/26/2023 19:28   MR ANGIO NECK WO CONTRAST  Result Date: 11/26/2023 CLINICAL DATA:  Neuro deficit, acute, stroke suspected; Vertebral artery dissection suspected; Vertigo, central EXAM: MRI HEAD WITHOUT CONTRAST MRA HEAD WITHOUT CONTRAST MRA NECK WITHOUT CONTRAST TECHNIQUE: Multiplanar, multi-echo pulse sequences of the brain and surrounding structures were acquired without intravenous contrast. Angiographic images of the Circle of Willis were acquired using MRA technique without intravenous contrast. Angiographic images of the neck were acquired using MRA technique without intravenous contrast. Carotid stenosis measurements (when applicable) are obtained utilizing NASCET criteria, using the distal internal carotid diameter as the denominator. COMPARISON:  None Available. FINDINGS: MRI HEAD FINDINGS Brain: There acute infarcts in the brachium pontis on the left (series 8, image 10). No hemorrhage. No hydrocephalus. No extra-axial fluid collection. No mass effect. No mass lesion. Background of chronic microvascular ischemic change with a chronic infarct in the right parietal lobe. Vascular: Normal  flow voids.  Skull and upper cervical spine: Normal marrow signal. Sinuses/Orbits: No middle ear or mastoid effusion. Paranasal sinuses are clear. Bilateral lens replacement. Orbits are otherwise unremarkable. Other: None. MRA HEAD FINDINGS Anterior circulation: Severe narrowing in the ophthalmic segment of the right ICA (series 1, image 71). No aneurysm. No vascular malformation. No occlusion. Posterior circulation: No aneurysm. No occlusion. No significant stenosis. Anatomic variants: None MRA NECK FINDINGS Aortic arch: Not visualized, likely to respiratory motion artifact. Right carotid system: The right common carotid artery in the proximal aspect of the right internal carotid artery are poorly visualized and incompletely assessed due to the degree of respiratory motion artifact. The mid to distal segments of the right ICA are normal in appearance. Left carotid system: The left common carotid artery in the proximal aspect of the left internal carotid artery are poorly visualized and incompletely assessed due to the degree of respiratory motion artifact. The mid to distal segments of the left ICA are normal in appearance. Vertebral arteries: The V1 segments of bilateral vertebral arteries are not visualized due to the degree of respiratory motion artifact. The distal V2 segments in the V4 segments are normal in appearance. Other: None. IMPRESSION: 1. Acute infarct in the brachium pontis on the left. 2. Severe narrowing in the ophthalmic segment of the right ICA. 3. The aortic arch, common carotid arteries, proximal internal carotid arteries, and V1 segments of bilateral vertebral arteries are poorly visualized and incompletely assessed due to the degree of respiratory motion artifact. The mid to distal segments of the carotid and vertebral arteries are normal in appearance. Electronically Signed   By: Lorenza Cambridge M.D.   On: 11/26/2023 19:28     Medications:    sodium chloride 50 mL/hr at 11/27/23 0046      stroke: early stages of recovery book   Does not apply Once   aspirin  81 mg Oral Daily   atorvastatin  40 mg Oral Daily   Chlorhexidine Gluconate Cloth  6 each Topical Q0600   clopidogrel  75 mg Oral Daily   heparin  5,000 Units Subcutaneous Q8H   acetaminophen **OR** acetaminophen (TYLENOL) oral liquid 160 mg/5 mL **OR** acetaminophen  Assessment/ Plan:  [name] is a 62 y.o.  female with end stage renal disease on hemodialysis, hypertension, hyperlipidemia, diabetes mellitus type II, coronary artery disease, CVA, diabetes neuropathy, diabetes retinopathy, sleep apnea who is admitted to [facility] on 11/26/2023 for Dizziness [R42]  [facility] MWF [facility] left AVF 115kg  End Stage Renal Disease: completed hemodialysis treatment on 12/6.  - Next treatment for tomorrow. Orders prepared. MWF schedule.   Hypertension: with acute CVA.  - Allow for permissive hypertension.  - Home regimen of amlodipine, furosemide, losartan, and metoprolol.  - Patient states she is not taking hydralazine currently.   Anemia with chronic kidney disease: hemoglobin 11.7. with acute ischemic event, avoid ESA.   Secondary Hyperparathyroidism - restart binder when diet advanced.    LOS: 0 Mahayla Haddaway 12/8/202411:18 AM

## 2023-11-27 NOTE — Progress Notes (Signed)
PT Cancellation Note  Patient Details Name: CECILLA BUSSEY MRN: [id number] DOB: 07-13-1961   Cancelled Treatment:    Reason Eval/Treat Not Completed: Medical issues which prohibited therapy;Other (comment) (Patient consult received and reviewed. Patient awaiting cardiology/work up to determine therapeutic appropriateness at this time. Will attempt again later when appropriate.)  Precious Bard, PT, DPT Physical Therapist - [facility]  Outpatient Physical Therapy- Main Campus [phone]    11/27/2023, 9:19 AM

## 2023-11-27 NOTE — Assessment & Plan Note (Signed)
Glycemic protocol, home insulin regimen.

## 2023-11-27 NOTE — ED Notes (Signed)
RT to bedside to set up CPAP.

## 2023-11-27 NOTE — Assessment & Plan Note (Signed)
Patient has a history of heart disease currently is chest pain-free.  Again we will continue patient on her aspirin Lipitor.  Metoprolol held to allow for permissive hypertension.

## 2023-11-27 NOTE — ED Notes (Signed)
Pt unable to eat meal tray at this time. Pt requested beef broth. Dietary called.

## 2023-11-27 NOTE — Assessment & Plan Note (Addendum)
2/2 to 1. Acute infarct in the brachium pontis on the left. 2. Severe narrowing in the ophthalmic segment of the right ICA. 3. The aortic arch, common carotid arteries, proximal internal carotid arteries, and V1 segments of bilateral vertebral arteries are poorly visualized and incompletely assessed due to the degree of respiratory motion artifact. The mid to distal segments of the carotid and vertebral arteries are normal in appearance. We have continued patient on her baseline antiplatelet regimen with aspirin and Plavix.

## 2023-11-27 NOTE — Assessment & Plan Note (Signed)
We will continue patient on aspirin and Plavix along with her statin therapy and we will proceed with evaluation with echo and bubble study.  Suspect at this point there may be a cardioembolic source.  Will repeat creatinine today and also obtain CT blood pressure medications angio chest to identify pulmonary embolism.  Home blood pressure medication include amlodipine 5 hydralazine 50 every 12 losartan and metoprolol all held to allow for permissive hypertension.

## 2023-11-27 NOTE — ED Notes (Signed)
Meal tray delivered. Tray set up for pt to be able to eat.

## 2023-11-28 DIAGNOSIS — D631 Anemia in chronic kidney disease: Secondary | ICD-10-CM | POA: Diagnosis present

## 2023-11-28 DIAGNOSIS — N184 Chronic kidney disease, stage 4 (severe): Secondary | ICD-10-CM | POA: Diagnosis not present

## 2023-11-28 DIAGNOSIS — I639 Cerebral infarction, unspecified: Secondary | ICD-10-CM

## 2023-11-28 DIAGNOSIS — N186 End stage renal disease: Secondary | ICD-10-CM | POA: Diagnosis present

## 2023-11-28 DIAGNOSIS — E785 Hyperlipidemia, unspecified: Secondary | ICD-10-CM | POA: Diagnosis present

## 2023-11-28 DIAGNOSIS — Z7982 Long term (current) use of aspirin: Secondary | ICD-10-CM | POA: Diagnosis not present

## 2023-11-28 DIAGNOSIS — Z6839 Body mass index (BMI) 39.0-39.9, adult: Secondary | ICD-10-CM | POA: Diagnosis not present

## 2023-11-28 DIAGNOSIS — E1140 Type 2 diabetes mellitus with diabetic neuropathy, unspecified: Secondary | ICD-10-CM | POA: Diagnosis present

## 2023-11-28 DIAGNOSIS — E11319 Type 2 diabetes mellitus with unspecified diabetic retinopathy without macular edema: Secondary | ICD-10-CM | POA: Diagnosis present

## 2023-11-28 DIAGNOSIS — E1165 Type 2 diabetes mellitus with hyperglycemia: Secondary | ICD-10-CM | POA: Diagnosis present

## 2023-11-28 DIAGNOSIS — I700 Atherosclerosis of aorta: Secondary | ICD-10-CM | POA: Diagnosis present

## 2023-11-28 DIAGNOSIS — Z1152 Encounter for screening for COVID-19: Secondary | ICD-10-CM | POA: Diagnosis not present

## 2023-11-28 DIAGNOSIS — N2581 Secondary hyperparathyroidism of renal origin: Secondary | ICD-10-CM | POA: Diagnosis present

## 2023-11-28 DIAGNOSIS — Z955 Presence of coronary angioplasty implant and graft: Secondary | ICD-10-CM | POA: Diagnosis not present

## 2023-11-28 DIAGNOSIS — I132 Hypertensive heart and chronic kidney disease with heart failure and with stage 5 chronic kidney disease, or end stage renal disease: Secondary | ICD-10-CM | POA: Diagnosis present

## 2023-11-28 DIAGNOSIS — Z7902 Long term (current) use of antithrombotics/antiplatelets: Secondary | ICD-10-CM | POA: Diagnosis not present

## 2023-11-28 DIAGNOSIS — R42 Dizziness and giddiness: Secondary | ICD-10-CM | POA: Diagnosis not present

## 2023-11-28 DIAGNOSIS — Z951 Presence of aortocoronary bypass graft: Secondary | ICD-10-CM | POA: Diagnosis not present

## 2023-11-28 DIAGNOSIS — E1122 Type 2 diabetes mellitus with diabetic chronic kidney disease: Secondary | ICD-10-CM | POA: Diagnosis present

## 2023-11-28 DIAGNOSIS — Z992 Dependence on renal dialysis: Secondary | ICD-10-CM | POA: Diagnosis not present

## 2023-11-28 DIAGNOSIS — Z794 Long term (current) use of insulin: Secondary | ICD-10-CM | POA: Diagnosis not present

## 2023-11-28 DIAGNOSIS — I2510 Atherosclerotic heart disease of native coronary artery without angina pectoris: Secondary | ICD-10-CM | POA: Diagnosis present

## 2023-11-28 DIAGNOSIS — I5032 Chronic diastolic (congestive) heart failure: Secondary | ICD-10-CM | POA: Diagnosis present

## 2023-11-28 DIAGNOSIS — I6381 Other cerebral infarction due to occlusion or stenosis of small artery: Secondary | ICD-10-CM | POA: Diagnosis present

## 2023-11-28 DIAGNOSIS — Z79899 Other long term (current) drug therapy: Secondary | ICD-10-CM | POA: Diagnosis not present

## 2023-11-28 LAB — CBC
HCT: 34 % — ABNORMAL LOW (ref 36.0–46.0)
Hemoglobin: 11.4 g/dL — ABNORMAL LOW (ref 12.0–15.0)
MCH: 30.4 pg (ref 26.0–34.0)
MCHC: 33.5 g/dL (ref 30.0–36.0)
MCV: 90.7 fL (ref 80.0–100.0)
Platelets: 259 10*3/uL (ref 150–400)
RBC: 3.75 MIL/uL — ABNORMAL LOW (ref 3.87–5.11)
RDW: 14 % (ref 11.5–15.5)
WBC: 9.8 10*3/uL (ref 4.0–10.5)
nRBC: 0 % (ref 0.0–0.2)

## 2023-11-28 LAB — RENAL FUNCTION PANEL
Albumin: 3.7 g/dL (ref 3.5–5.0)
Anion gap: 12 (ref 5–15)
BUN: 51 mg/dL — ABNORMAL HIGH (ref 8–23)
CO2: 22 mmol/L (ref 22–32)
Calcium: 9.4 mg/dL (ref 8.9–10.3)
Chloride: 104 mmol/L (ref 98–111)
Creatinine, Ser: 4.11 mg/dL — ABNORMAL HIGH (ref 0.44–1.00)
GFR, Estimated: 12 mL/min — ABNORMAL LOW (ref >60)
Glucose, Bld: 164 mg/dL — ABNORMAL HIGH (ref 70–99)
Phosphorus: 4.9 mg/dL — ABNORMAL HIGH (ref 2.5–4.6)
Potassium: 3.8 mmol/L (ref 3.5–5.1)
Sodium: 138 mmol/L (ref 135–145)

## 2023-11-28 LAB — GLUCOSE, CAPILLARY
Glucose-Capillary: 158 mg/dL — ABNORMAL HIGH (ref 70–99)
Glucose-Capillary: 107 mg/dL — ABNORMAL HIGH (ref 70–99)
Glucose-Capillary: 110 mg/dL — ABNORMAL HIGH (ref 70–99)
Glucose-Capillary: 238 mg/dL — ABNORMAL HIGH (ref 70–99)
Glucose-Capillary: 223 mg/dL — ABNORMAL HIGH (ref 70–99)

## 2023-11-28 MED ORDER — FUROSEMIDE 40 MG PO TABS
40.0000 mg | ORAL_TABLET | ORAL | Status: DC
  Administered 2023-11-29: 40 mg via ORAL
  Filled 2023-11-28 (×2): qty 1

## 2023-11-28 MED ORDER — ANTICOAGULANT SODIUM CITRATE 4% (200MG/5ML) IV SOLN: 5.0000 mL | Status: DC | PRN

## 2023-11-28 MED ORDER — LIDOCAINE HCL (PF) 1 % IJ SOLN
5.0000 mL | INTRAMUSCULAR | Status: DC | PRN
  Filled 2023-11-28: qty 5

## 2023-11-28 MED ORDER — LOSARTAN POTASSIUM 50 MG PO TABS
100.0000 mg | ORAL_TABLET | Freq: Every day | ORAL | Status: DC
  Administered 2023-11-28 – 2023-12-01 (×3): 100 mg via ORAL
  Filled 2023-11-28 (×3): qty 2

## 2023-11-28 MED ORDER — ALTEPLASE 2 MG IJ SOLR: 2.0000 mg | Freq: Once | INTRAMUSCULAR | Status: DC | PRN

## 2023-11-28 MED ORDER — HYDRALAZINE HCL 50 MG PO TABS
50.0000 mg | ORAL_TABLET | Freq: Two times a day (BID) | ORAL | Status: DC
  Administered 2023-11-28 – 2023-12-01 (×6): 50 mg via ORAL
  Filled 2023-11-28 (×6): qty 1

## 2023-11-28 MED ORDER — METOPROLOL TARTRATE 50 MG PO TABS
50.0000 mg | ORAL_TABLET | Freq: Two times a day (BID) | ORAL | Status: DC
  Administered 2023-11-28 – 2023-12-01 (×7): 50 mg via ORAL
  Filled 2023-11-28 (×7): qty 1

## 2023-11-28 MED ORDER — ATORVASTATIN CALCIUM 20 MG PO TABS
80.0000 mg | ORAL_TABLET | Freq: Every day | ORAL | Status: DC
  Administered 2023-11-28 – 2023-12-01 (×4): 80 mg via ORAL
  Filled 2023-11-28 (×2): qty 1
  Filled 2023-11-28: qty 4
  Filled 2023-11-28: qty 1

## 2023-11-28 MED ORDER — PENTAFLUOROPROP-TETRAFLUOROETH EX AERO: 1.0000 | INHALATION_SPRAY | CUTANEOUS | Status: DC | PRN

## 2023-11-28 MED ORDER — PROSOURCE PLUS PO LIQD
30.0000 mL | Freq: Two times a day (BID) | ORAL | Status: DC
Start: 2023-11-29 — End: 2023-12-02
  Administered 2023-11-29 (×2): 30 mL via ORAL
  Filled 2023-11-28 (×8): qty 30

## 2023-11-28 MED ORDER — AMLODIPINE BESYLATE 5 MG PO TABS
5.0000 mg | ORAL_TABLET | Freq: Every day | ORAL | Status: DC
Start: 2023-11-28 — End: 2023-12-02
  Administered 2023-11-28 – 2023-12-01 (×4): 5 mg via ORAL
  Filled 2023-11-28 (×4): qty 1

## 2023-11-28 MED ORDER — HEPARIN SODIUM (PORCINE) 1000 UNIT/ML DIALYSIS: 1000.0000 [IU] | INTRAMUSCULAR | Status: DC | PRN

## 2023-11-28 MED ORDER — NEPRO/CARBSTEADY PO LIQD: 237.0000 mL | ORAL | Status: DC | PRN

## 2023-11-28 MED ORDER — LIDOCAINE-PRILOCAINE 2.5-2.5 % EX CREA: 1.0000 | TOPICAL_CREAM | CUTANEOUS | Status: DC | PRN

## 2023-11-28 MED ORDER — RENA-VITE PO TABS
1.0000 | ORAL_TABLET | Freq: Every day | ORAL | Status: DC
  Administered 2023-11-28 – 2023-12-01 (×3): 1 via ORAL
  Filled 2023-11-28 (×5): qty 1

## 2023-11-28 MED ORDER — ONDANSETRON HCL 4 MG/2ML IJ SOLN: 4.0000 mg | Freq: Once | INTRAMUSCULAR | Status: DC

## 2023-11-28 NOTE — Progress Notes (Signed)
Progress Note    Sharon Arias  ZOX:[id number] DOB: 10-26-1961  DOA: 11/26/2023 PCP: Marguarite Arbour, MD      Brief Narrative:    Medical records reviewed and are as summarized below:  Sharon Arias is a 62 y.o. female with medical history significant for history of stroke, CAD s/p CABG and coronary stent on aspirin and Plavix, chronic diastolic CHF, ESRD on hemodialysis, type II DM, diabetic neuropathy, legal blindness, anemia of chronic disease, sleep apnea, obesity.  She presented to the hospital because of nausea, vomiting and dizziness.  She describes the dizziness as spinning sensation, as if the room is spinning.  She felt dizzy on 11/25/2023 around 10 AM.  She did not seek medical attention because she thought she would be okay.  She went for hemodialysis that same day.  However, when she got home, her symptoms had worsened.  She had nausea and multiple episodes of vomiting.  She presented to the hospital the following day because of worsening symptoms.   She was found to have acute stroke.    Assessment/Plan:   Principal Problem:   Dizziness Active Problems:   Acute CVA (cerebrovascular accident) (HCC)   Anemia of chronic kidney failure, stage 4 (severe) (HCC)   Coronary artery disease   Type 2 diabetes mellitus with renal complication (HCC)   HTN (hypertension)   ESRD (end stage renal disease) on dialysis (HCC)    Body mass index is 38.33 kg/m.  (Obesity)   Acute ischemic stroke in the left brachium pontis with vomiting and vertigo, severe narrowing in the ophthalmic segment of the right ICA, now with left facial droop: Continue aspirin, Plavix and statin.  Antiemetics as needed.  Continue meclizine.   PT and OT evaluation   ESRD: She had hemodialysis today.  Follow-up with nephrologist   Chronic diastolic CHF: Compensated 2D echo showed EF estimated at 55 to 60%, indeterminate LV diastolic parameters, mild mitral stenosis, no evidence of interatrial  shunt   CAD s/p CABG and coronary stent: Continue aspirin and Plavix   Type 2 DM with hyperglycemia:NovoLog as needed for hyperglycemia.  Continue insulin glargine at 40 units nightly and NovoLog 20 units 3 times daily with meals.  Hemoglobin A1c 8.1 Home insulin regimen: Tresiba 75 mg nightly, Humalog 20 units in the morning, 42 units at lunch and 52 units at dinner.   Hyperlipidemia: Continue Lipitor Lipid panel showed LDL 66, HDL 42, triglycerides 167, total cholesterol 141   Hypertension: Resume antihypertensives (hydralazine, losartan, metoprolol and amlodipine   Comorbidities include gout, anemia of chronic disease, sleep apnea,    Diet Order             Diet renal/carb modified with fluid restriction Diet-HS Snack? Nothing; Fluid restriction: 1200 mL Fluid; Room service appropriate? Yes; Fluid consistency: Thin  Diet effective now                            Consultants: Nephrologist Neurologist  Procedures: None    Medications:     stroke: early stages of recovery book   Does not apply Once   allopurinol  300 mg Oral q AM   amLODipine  5 mg Oral QHS   aspirin  81 mg Oral Daily   atorvastatin  80 mg Oral Daily   clopidogrel  75 mg Oral Daily   [START ON 11/29/2023] furosemide  40 mg Oral Q T,Th,S,Su   heparin  5,000  Units Subcutaneous Q8H   hydrALAZINE  50 mg Oral BID   insulin aspart  20 Units Subcutaneous TID WC   insulin glargine-yfgn  40 Units Subcutaneous QHS   losartan  100 mg Oral Daily   metoprolol tartrate  50 mg Oral BID   ondansetron (ZOFRAN) IV  4 mg Intravenous Once   sevelamer carbonate  1,600 mg Oral TID AC   Continuous Infusions:     Anti-infectives (From admission, onward)    None              Family Communication/Anticipated D/C date and plan/Code Status   DVT prophylaxis: heparin injection 5,000 Units Start: 11/26/23 2200     Code Status: Full Code  Family Communication: None Disposition Plan: Plan  to discharge to SNF   Status is: Inpatient Remains inpatient appropriate because: Acute stroke         Subjective:   Interval events noted.  She just came back from hemodialysis.  She still complains of dizziness.  She does not want to open her eyes because it makes the dizziness worse.  No other complaints.  Objective:    Vitals:   11/28/23 1200 11/28/23 1203 11/28/23 1216 11/28/23 1220  BP: (!) 140/61  (!) 158/55   Pulse: 63 69 63   Resp: 12 14 11    Temp:   98.6 F (37 C)   TempSrc:   Oral   SpO2: 94% 98% 92%   Weight:    111 kg  Height:       No data found.   Intake/Output Summary (Last 24 hours) at 11/28/2023 1341 Last data filed at 11/28/2023 1216 Gross per 24 hour  Intake --  Output 750 ml  Net -750 ml   Filed Weights   11/26/23 1226 11/28/23 0904 11/28/23 1220  Weight: 113.4 kg 111.4 kg 111 kg    Exam:  GEN: NAD SKIN: Warm and dry EYES: No pallor or icterus ENT: MMM CV: RRR PULM: CTA B ABD: soft, obese, NT, +BS CNS: AAO x 3, left facial droop EXT: No edema or tenderness       Data Reviewed:   I have personally reviewed following labs and imaging studies:  Labs: Labs show the following:   Basic Metabolic Panel: Recent Labs  Lab 11/26/23 1228 11/28/23 0900  NA 138 138  K 3.7 3.8  CL 97* 104  CO2 24 22  GLUCOSE 297* 164*  BUN 36* 51*  CREATININE 3.52* 4.11*  CALCIUM 9.6 9.4  PHOS  --  4.9*   GFR Estimated Creatinine Clearance: 18.2 mL/min (A) (by C-G formula based on SCr of 4.11 mg/dL (H)). Liver Function Tests: Recent Labs  Lab 11/28/23 0900  ALBUMIN 3.7   No results for input(s): "LIPASE", "AMYLASE" in the last 168 hours. No results for input(s): "AMMONIA" in the last 168 hours. Coagulation profile Recent Labs  Lab 11/26/23 0018  INR 1.3*    CBC: Recent Labs  Lab 11/26/23 1228 11/28/23 0900  WBC 13.1* 9.8  HGB 11.7* 11.4*  HCT 34.8* 34.0*  MCV 91.6 90.7  PLT 268 259   Cardiac Enzymes: No results for  input(s): "CKTOTAL", "CKMB", "CKMBINDEX", "TROPONINI" in the last 168 hours. BNP (last 3 results) No results for input(s): "PROBNP" in the last 8760 hours. CBG: Recent Labs  Lab 11/27/23 1610 11/27/23 2238 11/28/23 0747 11/28/23 1047 11/28/23 1120  GLUCAP 233* 217* 158* 107* 110*   D-Dimer: No results for input(s): "DDIMER" in the last 72 hours. Hgb A1c: Recent  Labs    11/26/23 1228  HGBA1C 8.1*   Lipid Profile: Recent Labs    11/27/23 0454  CHOL 141  HDL 42  LDLCALC 66  TRIG 167*  CHOLHDL 3.4   Thyroid function studies: No results for input(s): "TSH", "T4TOTAL", "T3FREE", "THYROIDAB" in the last 72 hours.  Invalid input(s): "FREET3" Anemia work up: No results for input(s): "VITAMINB12", "FOLATE", "FERRITIN", "TIBC", "IRON", "RETICCTPCT" in the last 72 hours. Sepsis Labs: Recent Labs  Lab 11/26/23 1228 11/28/23 0900  WBC 13.1* 9.8    Microbiology No results found for this or any previous visit (from the past 240 hour(s)).  Procedures and diagnostic studies:  ECHOCARDIOGRAM COMPLETE BUBBLE STUDY  Result Date: 11/27/2023    ECHOCARDIOGRAM REPORT   Patient Name:   LAURN DEMBSKI Date of Exam: 11/27/2023 Medical Rec #:  [id number]      Height:       67.0 in Accession #:    [id number]     Weight:       250.0 lb Date of Birth:  1961-11-10       BSA:          2.223 m Patient Age:    62 years       BP:           175/59 mmHg Patient Gender: F              HR:           67 bpm. Exam Location:  [facility] Procedure: 2D Echo, Cardiac Doppler, Color Doppler and Saline Contrast Bubble            Study Indications:     Stroke 434.91 / I63.9  History:         Patient has prior history of Echocardiogram examinations. CHF;                  Risk Factors:Diabetes.  Sonographer:     Neysa Bonito Roar Referring Phys:  [id number] Eliezer Mccoy PATEL Diagnosing Phys: Jodelle Red MD IMPRESSIONS  1. Left ventricular ejection fraction, by estimation, is 55 to 60%. The left ventricle has normal function.  The left ventricle has no regional wall motion abnormalities. There is mild left ventricular hypertrophy. Left ventricular diastolic parameters are indeterminate.  2. Right ventricular systolic function is normal. The right ventricular size is normal. Tricuspid regurgitation signal is inadequate for assessing PA pressure.  3. The mitral valve is degenerative. Trivial mitral valve regurgitation. Mild mitral stenosis.  4. The aortic valve was not well visualized. Aortic valve regurgitation is not visualized. Aortic valve sclerosis is present, with no evidence of aortic valve stenosis.  5. Agitated saline contrast bubble study was negative, with no evidence of any interatrial shunt. Comparison(s): No significant change from prior study. Conclusion(s)/Recommendation(s): No intracardiac source of embolism detected on this transthoracic study. Consider a transesophageal echocardiogram to exclude cardiac source of embolism if clinically indicated. FINDINGS  Left Ventricle: Left ventricular ejection fraction, by estimation, is 55 to 60%. The left ventricle has normal function. The left ventricle has no regional wall motion abnormalities. The left ventricular internal cavity size was normal in size. There is  mild left ventricular hypertrophy. Left ventricular diastolic parameters are indeterminate. Right Ventricle: The right ventricular size is normal. Right vetricular wall thickness was not well visualized. Right ventricular systolic function is normal. Tricuspid regurgitation signal is inadequate for assessing PA pressure. Left Atrium: Left atrial size was normal in size. Right Atrium: Right atrial size was normal in  size. Pericardium: There is no evidence of pericardial effusion. Mitral Valve: The mitral valve is degenerative in appearance. There is mild thickening of the mitral valve leaflet(s). There is mild calcification of the mitral valve leaflet(s). Trivial mitral valve regurgitation. Mild mitral valve stenosis. MV  peak gradient, 12.7 mmHg. The mean mitral valve gradient is 4.0 mmHg. Tricuspid Valve: The tricuspid valve is not well visualized. Tricuspid valve regurgitation is not demonstrated. No evidence of tricuspid stenosis. Aortic Valve: The aortic valve was not well visualized. Aortic valve regurgitation is not visualized. Aortic valve sclerosis is present, with no evidence of aortic valve stenosis. Aortic valve mean gradient measures 3.0 mmHg. Aortic valve peak gradient measures 6.7 mmHg. Aortic valve area, by VTI measures 1.80 cm. Pulmonic Valve: The pulmonic valve was not well visualized. Pulmonic valve regurgitation is not visualized. No evidence of pulmonic stenosis. Aorta: The aortic root and ascending aorta are structurally normal, with no evidence of dilitation. IAS/Shunts: The interatrial septum appears to be lipomatous. The interatrial septum was not well visualized. Agitated saline contrast was given intravenously to evaluate for intracardiac shunting. Agitated saline contrast bubble study was negative, with no evidence of any interatrial shunt.  LEFT VENTRICLE PLAX 2D LVIDd:         5.30 cm   Diastology LVIDs:         4.00 cm   LV e' medial:    4.68 cm/s LV PW:         1.20 cm   LV E/e' medial:  31.2 LV IVS:        1.30 cm   LV e' lateral:   6.96 cm/s LVOT diam:     1.70 cm   LV E/e' lateral: 21.0 LV SV:         54 LV SV Index:   24 LVOT Area:     2.27 cm  RIGHT VENTRICLE RV Basal diam:  3.60 cm RV Mid diam:    3.00 cm RV S prime:     8.05 cm/s TAPSE (M-mode): 2.0 cm LEFT ATRIUM             Index        RIGHT ATRIUM           Index LA diam:        4.00 cm 1.80 cm/m   RA Area:     15.60 cm LA Vol (A2C):   51.0 ml 22.94 ml/m  RA Volume:   35.10 ml  15.79 ml/m LA Vol (A4C):   56.3 ml 25.32 ml/m LA Biplane Vol: 53.4 ml 24.02 ml/m  AORTIC VALVE                    PULMONIC VALVE AV Area (Vmax):    1.60 cm     PV Vmax:        1.15 m/s AV Area (Vmean):   1.71 cm     PV Peak grad:   5.3 mmHg AV Area (VTI):      1.80 cm     RVOT Peak grad: 2 mmHg AV Vmax:           129.00 cm/s AV Vmean:          84.900 cm/s AV VTI:            0.300 m AV Peak Grad:      6.7 mmHg AV Mean Grad:      3.0 mmHg LVOT Vmax:         91.00  cm/s LVOT Vmean:        63.900 cm/s LVOT VTI:          0.238 m LVOT/AV VTI ratio: 0.79  AORTA Ao Asc diam: 2.90 cm MITRAL VALVE MV Area (PHT): 5.16 cm     SHUNTS MV Area VTI:   1.22 cm     Systemic VTI:  0.24 m MV Peak grad:  12.7 mmHg    Systemic Diam: 1.70 cm MV Mean grad:  4.0 mmHg MV Vmax:       1.78 m/s MV Vmean:      85.6 cm/s MV Decel Time: 147 msec MV E velocity: 146.00 cm/s MV A velocity: 91.70 cm/s MV E/A ratio:  1.59 MV A Prime:    8.9 cm/s Jodelle Red MD Electronically signed by Jodelle Red MD Signature Date/Time: 11/27/2023/4:54:31 PM    Final    DG Chest 1 View  Result Date: 11/27/2023 CLINICAL DATA:  62 year old female with left brainstem infarct. EXAM: CHEST  1 VIEW COMPARISON:  Chest radiographs 02/05/2021. FINDINGS: Portable AP semi upright view at 0421 hours. More lordotic positioning. Chronic sternotomy, CABG. Stable mild cardiomegaly mediastinal contours. Visualized tracheal air column is within normal limits. Allowing for portable technique the lungs are clear. No pneumothorax or pleural effusion. Paucity of bowel gas. No acute osseous abnormality identified. IMPRESSION: Chronic cardiac surgery.  No acute cardiopulmonary abnormality. Electronically Signed   By: Odessa Fleming M.D.   On: 11/27/2023 05:31   MR BRAIN WO CONTRAST  Result Date: 11/26/2023 CLINICAL DATA:  Neuro deficit, acute, stroke suspected; Vertebral artery dissection suspected; Vertigo, central EXAM: MRI HEAD WITHOUT CONTRAST MRA HEAD WITHOUT CONTRAST MRA NECK WITHOUT CONTRAST TECHNIQUE: Multiplanar, multi-echo pulse sequences of the brain and surrounding structures were acquired without intravenous contrast. Angiographic images of the Circle of Willis were acquired using MRA technique without  intravenous contrast. Angiographic images of the neck were acquired using MRA technique without intravenous contrast. Carotid stenosis measurements (when applicable) are obtained utilizing NASCET criteria, using the distal internal carotid diameter as the denominator. COMPARISON:  None Available. FINDINGS: MRI HEAD FINDINGS Brain: There acute infarcts in the brachium pontis on the left (series 8, image 10). No hemorrhage. No hydrocephalus. No extra-axial fluid collection. No mass effect. No mass lesion. Background of chronic microvascular ischemic change with a chronic infarct in the right parietal lobe. Vascular: Normal flow voids. Skull and upper cervical spine: Normal marrow signal. Sinuses/Orbits: No middle ear or mastoid effusion. Paranasal sinuses are clear. Bilateral lens replacement. Orbits are otherwise unremarkable. Other: None. MRA HEAD FINDINGS Anterior circulation: Severe narrowing in the ophthalmic segment of the right ICA (series 1, image 71). No aneurysm. No vascular malformation. No occlusion. Posterior circulation: No aneurysm. No occlusion. No significant stenosis. Anatomic variants: None MRA NECK FINDINGS Aortic arch: Not visualized, likely to respiratory motion artifact. Right carotid system: The right common carotid artery in the proximal aspect of the right internal carotid artery are poorly visualized and incompletely assessed due to the degree of respiratory motion artifact. The mid to distal segments of the right ICA are normal in appearance. Left carotid system: The left common carotid artery in the proximal aspect of the left internal carotid artery are poorly visualized and incompletely assessed due to the degree of respiratory motion artifact. The mid to distal segments of the left ICA are normal in appearance. Vertebral arteries: The V1 segments of bilateral vertebral arteries are not visualized due to the degree of respiratory motion artifact. The distal V2 segments in  the V4 segments  are normal in appearance. Other: None. IMPRESSION: 1. Acute infarct in the brachium pontis on the left. 2. Severe narrowing in the ophthalmic segment of the right ICA. 3. The aortic arch, common carotid arteries, proximal internal carotid arteries, and V1 segments of bilateral vertebral arteries are poorly visualized and incompletely assessed due to the degree of respiratory motion artifact. The mid to distal segments of the carotid and vertebral arteries are normal in appearance. Electronically Signed   By: Lorenza Cambridge M.D.   On: 11/26/2023 19:28   MR ANGIO HEAD WO CONTRAST  Result Date: 11/26/2023 CLINICAL DATA:  Neuro deficit, acute, stroke suspected; Vertebral artery dissection suspected; Vertigo, central EXAM: MRI HEAD WITHOUT CONTRAST MRA HEAD WITHOUT CONTRAST MRA NECK WITHOUT CONTRAST TECHNIQUE: Multiplanar, multi-echo pulse sequences of the brain and surrounding structures were acquired without intravenous contrast. Angiographic images of the Circle of Willis were acquired using MRA technique without intravenous contrast. Angiographic images of the neck were acquired using MRA technique without intravenous contrast. Carotid stenosis measurements (when applicable) are obtained utilizing NASCET criteria, using the distal internal carotid diameter as the denominator. COMPARISON:  None Available. FINDINGS: MRI HEAD FINDINGS Brain: There acute infarcts in the brachium pontis on the left (series 8, image 10). No hemorrhage. No hydrocephalus. No extra-axial fluid collection. No mass effect. No mass lesion. Background of chronic microvascular ischemic change with a chronic infarct in the right parietal lobe. Vascular: Normal flow voids. Skull and upper cervical spine: Normal marrow signal. Sinuses/Orbits: No middle ear or mastoid effusion. Paranasal sinuses are clear. Bilateral lens replacement. Orbits are otherwise unremarkable. Other: None. MRA HEAD FINDINGS Anterior circulation: Severe narrowing in the  ophthalmic segment of the right ICA (series 1, image 71). No aneurysm. No vascular malformation. No occlusion. Posterior circulation: No aneurysm. No occlusion. No significant stenosis. Anatomic variants: None MRA NECK FINDINGS Aortic arch: Not visualized, likely to respiratory motion artifact. Right carotid system: The right common carotid artery in the proximal aspect of the right internal carotid artery are poorly visualized and incompletely assessed due to the degree of respiratory motion artifact. The mid to distal segments of the right ICA are normal in appearance. Left carotid system: The left common carotid artery in the proximal aspect of the left internal carotid artery are poorly visualized and incompletely assessed due to the degree of respiratory motion artifact. The mid to distal segments of the left ICA are normal in appearance. Vertebral arteries: The V1 segments of bilateral vertebral arteries are not visualized due to the degree of respiratory motion artifact. The distal V2 segments in the V4 segments are normal in appearance. Other: None. IMPRESSION: 1. Acute infarct in the brachium pontis on the left. 2. Severe narrowing in the ophthalmic segment of the right ICA. 3. The aortic arch, common carotid arteries, proximal internal carotid arteries, and V1 segments of bilateral vertebral arteries are poorly visualized and incompletely assessed due to the degree of respiratory motion artifact. The mid to distal segments of the carotid and vertebral arteries are normal in appearance. Electronically Signed   By: Lorenza Cambridge M.D.   On: 11/26/2023 19:28   MR ANGIO NECK WO CONTRAST  Result Date: 11/26/2023 CLINICAL DATA:  Neuro deficit, acute, stroke suspected; Vertebral artery dissection suspected; Vertigo, central EXAM: MRI HEAD WITHOUT CONTRAST MRA HEAD WITHOUT CONTRAST MRA NECK WITHOUT CONTRAST TECHNIQUE: Multiplanar, multi-echo pulse sequences of the brain and surrounding structures were acquired  without intravenous contrast. Angiographic images of the Circle of Willis were acquired  using MRA technique without intravenous contrast. Angiographic images of the neck were acquired using MRA technique without intravenous contrast. Carotid stenosis measurements (when applicable) are obtained utilizing NASCET criteria, using the distal internal carotid diameter as the denominator. COMPARISON:  None Available. FINDINGS: MRI HEAD FINDINGS Brain: There acute infarcts in the brachium pontis on the left (series 8, image 10). No hemorrhage. No hydrocephalus. No extra-axial fluid collection. No mass effect. No mass lesion. Background of chronic microvascular ischemic change with a chronic infarct in the right parietal lobe. Vascular: Normal flow voids. Skull and upper cervical spine: Normal marrow signal. Sinuses/Orbits: No middle ear or mastoid effusion. Paranasal sinuses are clear. Bilateral lens replacement. Orbits are otherwise unremarkable. Other: None. MRA HEAD FINDINGS Anterior circulation: Severe narrowing in the ophthalmic segment of the right ICA (series 1, image 71). No aneurysm. No vascular malformation. No occlusion. Posterior circulation: No aneurysm. No occlusion. No significant stenosis. Anatomic variants: None MRA NECK FINDINGS Aortic arch: Not visualized, likely to respiratory motion artifact. Right carotid system: The right common carotid artery in the proximal aspect of the right internal carotid artery are poorly visualized and incompletely assessed due to the degree of respiratory motion artifact. The mid to distal segments of the right ICA are normal in appearance. Left carotid system: The left common carotid artery in the proximal aspect of the left internal carotid artery are poorly visualized and incompletely assessed due to the degree of respiratory motion artifact. The mid to distal segments of the left ICA are normal in appearance. Vertebral arteries: The V1 segments of bilateral vertebral  arteries are not visualized due to the degree of respiratory motion artifact. The distal V2 segments in the V4 segments are normal in appearance. Other: None. IMPRESSION: 1. Acute infarct in the brachium pontis on the left. 2. Severe narrowing in the ophthalmic segment of the right ICA. 3. The aortic arch, common carotid arteries, proximal internal carotid arteries, and V1 segments of bilateral vertebral arteries are poorly visualized and incompletely assessed due to the degree of respiratory motion artifact. The mid to distal segments of the carotid and vertebral arteries are normal in appearance. Electronically Signed   By: Lorenza Cambridge M.D.   On: 11/26/2023 19:28               LOS: 0 days   Aidian Salomon  [facility]   Pager on [email or web address]. If 7PM-7AM, please contact night-coverage at [email or web address]     11/28/2023, 1:41 PM

## 2023-11-28 NOTE — Progress Notes (Signed)
SLP Cancellation Note  Patient Details Name: LYNZE ASHMORE MRN: [id number] DOB: 1961-05-03   Cancelled treatment:       Reason Eval/Treat Not Completed: Patient at procedure or test/unavailable (Pt OTF for HD. Will continue efforts as appropriate.)  Clyde Canterbury, M.S., CCC-SLP Speech-Language Pathologist [facility] [phone] Arnette Felts)  Woodroe Chen 11/28/2023, 12:07 PM

## 2023-11-28 NOTE — Procedures (Signed)
Received patient in bed to unit.  Alert and oriented.  Informed consent signed and in chart.   TX duration: 3hrs  Episode of hypotension. Turned UF off.  Transported back to the room  Alert, without acute distress.  Hand-off given to patient's nurse.   Access used: Left upper arm AVF. Access issues: NONE  Total UF removed: . Medication(s) given: NONE.    [facility]

## 2023-11-28 NOTE — Evaluation (Signed)
Physical Therapy Evaluation Patient Details Name: Sharon Arias MRN: [id number] DOB: 12-27-1960 Today's Date: 11/28/2023  History of Present Illness  Patient is a 62 year old female who presents with vertigo and palsies of 6th and 7th cranial nerves on the left. Found to have acute ischemic infarct in the left brachium pontis. History of ESRD on hemodialysis, hypertension, hyperlipidemia, diabetes mellitus type II, CAD, CVA, neuropathy, retinopathy, sleep apnea.   Clinical Impression  Patient is agreeable to PT evaluation. She reports feeling continued dizziness but is very motivated to move. She is independent at baseline without assistive device. She has baseline vision issues and does not wear glasses usually.  Today, the patient reports feeling less dizziness with right eye closed. Eye patch used during mobility efforts in attempts to minimize dizziness and prevent vomiting with mobility. No focal weakness is noted in BLE with manual muscle testing, however patient does report feeling generalized leg weakness with standing. Pre gait activity performed in standing x 2 bouts. Standing tolerance of several minutes. Patient required increased assistance with dynamic activity in standing with decreased coordination when advancing the left foot. The patient is not at her baseline level of functional independence. Consider intensive rehabilitation > 3 hours/day after this hospital stay. PT will continue to follow.       If plan is discharge home, recommend the following: A lot of help with walking and/or transfers;A little help with bathing/dressing/bathroom;Assistance with cooking/housework;Assist for transportation;Help with stairs or ramp for entrance   Can travel by private vehicle        Equipment Recommendations None recommended by PT  Recommendations for Other Services  Rehab consult    Functional Status Assessment Patient has had a recent decline in their functional status and  demonstrates the ability to make significant improvements in function in a reasonable and predictable amount of time.     Precautions / Restrictions Precautions Precautions: Fall Precaution Comments: baseline vision issues Restrictions Weight Bearing Restrictions: No      Mobility  Bed Mobility Overal bed mobility: Needs Assistance Bed Mobility: Supine to Sit, Sit to Supine     Supine to sit: Min assist, HOB elevated Sit to supine: Min assist, HOB elevated   General bed mobility comments: patient using therapist hand to pull herself up with sitting upright. intermittent assistance for RLE support to return to bed    Transfers Overall transfer level: Needs assistance Equipment used: 2 person hand held assist Transfers: Sit to/from Stand Sit to Stand: Min assist, +2 physical assistance           General transfer comment: verbal cues for technique. steadying assistance required for standing. 2 standing bouts performed    Ambulation/Gait             Pre-gait activities: weight shifting faciliation provided to right/left with sensation of falling. patient is able to advance the right leg forward without significant loss of balance. decreased coordination with L foot placement when taking a step with loss of balance. patient is able to take side steps to the right. Mod A +2 for dynamic pre-gait activity. General Gait Details: 2 standing bouts performed with emphasis on pre-gait activity. patient has dizziness (room spinning) sensation and feels off balance with standing. ambulation not attempted today.  Stairs            Wheelchair Mobility     Tilt Bed    Modified Rankin (Stroke Patients Only)       Balance Overall balance assessment: Needs assistance  Sitting-balance support: Feet supported Sitting balance-Leahy Scale: Fair   Postural control: Posterior lean Standing balance support: Bilateral upper extremity supported Standing balance-Leahy Scale:  Poor Standing balance comment: external support require. occasional posterior lean                             Pertinent Vitals/Pain Pain Assessment Pain Assessment: No/denies pain    Home Living Family/patient expects to be discharged to:: Private residence Living Arrangements: Alone Available Help at Discharge: Available PRN/intermittently Type of Home: House (with an apartment attachment) Home Access: Stairs to enter Entrance Stairs-Rails: Right Entrance Stairs-Number of Steps: 2   Home Layout: One level Home Equipment: Agricultural consultant (2 wheels);Cane - single point;Wheelchair - manual;Shower seat;Grab bars - tub/shower Additional Comments: apartment has a ramped entrance    Prior Function Prior Level of Function : Independent/Modified Independent             Mobility Comments: does not drive. uses transportation to get to medical appointments. gets groceries delivered. independent with mobility without assistive device       Extremity/Trunk Assessment   Upper Extremity Assessment Upper Extremity Assessment: Defer to OT evaluation    Lower Extremity Assessment Lower Extremity Assessment: RLE deficits/detail;LLE deficits/detail RLE Deficits / Details: 5/5 with MMT, dorsiflexion, plantarflexion, hip add/abd, knee extension. however patient feels generalized weakness in the legs with standing RLE Sensation: history of peripheral neuropathy LLE Deficits / Details: 5/5 with MMT, dorsiflexion, plantarflexion, hip add/abd, knee extension. however patient feels generalized weakness in the legs with standing LLE Sensation: history of peripheral neuropathy LLE Coordination: decreased gross motor       Communication   Communication Communication: No apparent difficulties Cueing Techniques: Verbal cues  Cognition Arousal: Alert Behavior During Therapy: WFL for tasks assessed/performed Overall Cognitive Status: Within Functional Limits for tasks assessed                                  General Comments: patient is able to follow single step commands consistently        General Comments General comments (skin integrity, edema, etc.): patient reports the room is spinning with all activity but decreased with use of eye patch on the right eye. encouraged patient to only use the eye patch intermittently and to alternate eyes if possible. she may benefit from occluded (with tape) lense with glasses    Exercises     Assessment/Plan    PT Assessment Patient needs continued PT services  PT Problem List Decreased strength;Decreased activity tolerance;Decreased balance;Decreased mobility;Decreased coordination;Impaired sensation;Decreased safety awareness       PT Treatment Interventions DME instruction;Gait training;Stair training;Functional mobility training;Therapeutic activities;Therapeutic exercise;Balance training;Neuromuscular re-education;Cognitive remediation;Patient/family education;Wheelchair mobility training    PT Goals (Current goals can be found in the Care Plan section)  Acute Rehab PT Goals Patient Stated Goal: to have less dizziness PT Goal Formulation: With patient Time For Goal Achievement: 12/12/23 Potential to Achieve Goals: Good    Frequency Min 1X/week     Co-evaluation PT/OT/SLP Co-Evaluation/Treatment: Yes Reason for Co-Treatment: Complexity of the patient's impairments (multi-system involvement) PT goals addressed during session: Mobility/safety with mobility         AM-PAC PT "6 Clicks" Mobility  Outcome Measure Help needed turning from your back to your side while in a flat bed without using bedrails?: A Little Help needed moving from lying on your back to sitting on  the side of a flat bed without using bedrails?: A Little Help needed moving to and from a bed to a chair (including a wheelchair)?: A Lot Help needed standing up from a chair using your arms (e.g., wheelchair or bedside chair)?: A  Lot Help needed to walk in hospital room?: Total Help needed climbing 3-5 steps with a railing? : Total 6 Click Score: 12    End of Session   Activity Tolerance: Patient tolerated treatment well;Patient limited by fatigue Patient left: in bed;with bed alarm set;with call bell/phone within reach   PT Visit Diagnosis: Difficulty in walking, not elsewhere classified (R26.2);Other symptoms and signs involving the nervous system (R29.898)    Time: 0454-0981 PT Time Calculation (min) (ACUTE ONLY): 29 min   Charges:   PT Evaluation $PT Eval Moderate Complexity: 1 Mod PT Treatments $Therapeutic Activity: 8-22 mins PT General Charges $$ ACUTE PT VISIT: 1 Visit         Donna Bernard, PT, MPT   Ina Homes 11/28/2023, 2:17 PM

## 2023-11-28 NOTE — Progress Notes (Signed)
OT Cancellation Note  Patient Details Name: Sharon Arias MRN: [id number] DOB: 1961-11-17   Cancelled Treatment:    Reason Eval/Treat Not Completed: Patient at procedure or test/ unavailable. Order received, chart reviewed pt off unit for HD. Initiate services as pt available.  Butch Penny, SOT

## 2023-11-28 NOTE — Progress Notes (Signed)
Inpatient Rehab Admissions Coordinator:  ? ?Per therapy recommendations,  patient was screened for CIR candidacy by Devaney Segers, MS, CCC-SLP. At this time, Pt. Appears to be a a potential candidate for CIR. I will place   order for rehab consult per protocol for full assessment. Please contact me any with questions. ? ?Trine Fread, MS, CCC-SLP ?Rehab Admissions Coordinator  ?[phone] (celll) ?[phone] (office) ? ?

## 2023-11-28 NOTE — Evaluation (Signed)
Occupational Therapy Evaluation Patient Details Name: Sharon Arias MRN: [id number] DOB: [birth year] Today's Date: 11/28/2023   History of Present Illness Patient is a 62 year old female who presents with vertigo and palsies of 6th and 7th cranial nerves on the left. Found to have acute ischemic infarct in the left brachium pontis. History of ESRD on hemodialysis, hypertension, hyperlipidemia, diabetes mellitus type II, CAD, CVA, neuropathy, retinopathy, sleep apnea   Clinical Impression   Pt was seen for OT evaluation this date. Prior to hospital admission, pt was IND with all ADL's. Pt lives alone. Pt presents to acute OT demonstrating impaired ADL performance and functional mobility 2/2 (See OT problem list for additional functional deficits). Upon arrival to room pt supine in bed, agreeable to Co-Tx. Pt completed bed mobility with Min A +HOB elevated. Pt completed MMT and demonstrated BUE AROM in all planes of movement, no noted deficits. Pt completed STS with Min Ax2 +2HHA. Pt utilized eye patch to help reduce dizziness during session. Pt completed static standing with Min A x2, tolerated ~7 minutes no LOB noted.   Pt completed taking a step forward and backward with Mod A x2 +2HHA. Pt completed taking side steps towards the [facility] with CGA +2 HHA. Pt very motivated and willing to work with therapy. Pt returned to bed. Pt left in chair position in bed with call bell within reach and all needs met.  Pt would benefit from skilled OT services to address noted impairments and functional limitations (see below for any additional details) in order to maximize safety and independence while minimizing falls risk and caregiver burden. Anticipate the need for follow up OT services upon acute hospital DC.        If plan is discharge home, recommend the following: A lot of help with walking and/or transfers;A lot of help with bathing/dressing/bathroom;Assist for transportation;Help with stairs or ramp for  entrance    Functional Status Assessment  Patient has had a recent decline in their functional status and demonstrates the ability to make significant improvements in function in a reasonable and predictable amount of time.  Equipment Recommendations  Other (comment) (Defer)    Recommendations for Other Services       Precautions / Restrictions Precautions Precautions: Fall Precaution Comments: baseline vision issues Restrictions Weight Bearing Restrictions: No      Mobility Bed Mobility Overal bed mobility: Needs Assistance Bed Mobility: Supine to Sit, Sit to Supine     Supine to sit: Min assist, HOB elevated Sit to supine: Min assist, HOB elevated   General bed mobility comments: patient using therapist hand to pull herself up with sitting upright. intermittent assistance for RLE support to return to bed Patient Response: Cooperative  Transfers Overall transfer level: Needs assistance Equipment used: 2 person hand held assist Transfers: Sit to/from Stand Sit to Stand: Min assist, +2 physical assistance           General transfer comment: verbal cues for technique. steadying assistance required for standing. 2 standing bouts performed      Balance Overall balance assessment: Needs assistance Sitting-balance support: Feet supported Sitting balance-Leahy Scale: Fair     Standing balance support: Bilateral upper extremity supported Standing balance-Leahy Scale: Poor                             ADL either performed or assessed with clinical judgement   ADL Overall ADL's : Needs assistance/impaired  Functional mobility during ADLs: Contact guard assist;Minimal assistance;+2 for physical assistance;Moderate assistance General ADL Comments: Pt completed MMT and demonstrated BUE AROM in all planes of movement, no noted deficits. Pt completed STS with Min Ax2 +2HHA. Pt utilized eye patch to help reduce  dizziness during session. Pt completed static standing with Min A x2, tolerated ~7 minutes no LOB noted. Pt completed taking a step forward and backward with Mod A x2 +2HHA. Pt completed taking side steps towards the [facility] with CGA +2 HHA. Pt very motivated and willing to work with therapy.     Vision         Perception         Praxis         Pertinent Vitals/Pain Pain Assessment Pain Assessment: No/denies pain     Extremity/Trunk Assessment Upper Extremity Assessment Upper Extremity Assessment: Overall WFL for tasks assessed   Lower Extremity Assessment Lower Extremity Assessment: Defer to PT evaluation RLE Deficits / Details: 5/5 with MMT, dorsiflexion, plantarflexion, hip add/abd, knee extension. however patient feels generalized weakness in the legs with standing RLE Sensation: history of peripheral neuropathy LLE Deficits / Details: 5/5 with MMT, dorsiflexion, plantarflexion, hip add/abd, knee extension. however patient feels generalized weakness in the legs with standing LLE Sensation: history of peripheral neuropathy LLE Coordination: decreased gross motor       Communication Communication Communication: No apparent difficulties Cueing Techniques: Verbal cues   Cognition Arousal: Alert Behavior During Therapy: WFL for tasks assessed/performed Overall Cognitive Status: Within Functional Limits for tasks assessed                                 General Comments: patient is able to follow single step commands consistently     General Comments  patient reports the room is spinning with all activity but decreased with use of eye patch on the right eye. encouraged patient to only use the eye patch intermittently and to alternate eyes if possible. she may benefit from occluded (with tape) lense with glasses    Exercises     Shoulder Instructions      Home Living Family/patient expects to be discharged to:: Private residence Living Arrangements:  Alone Available Help at Discharge: Available PRN/intermittently Type of Home: House (with an apartment attachment) Home Access: Stairs to enter [employer] of Steps: 2 Entrance Stairs-Rails: Right Home Layout: One level     Bathroom Shower/Tub: Producer, television/film/video: Handicapped height     Home Equipment: Agricultural consultant (2 wheels);Cane - single point;Wheelchair - manual;Shower seat;Grab bars - tub/shower   Additional Comments: apartment has a ramped entrance      Prior Functioning/Environment Prior Level of Function : Independent/Modified Independent             Mobility Comments: does not drive. uses transportation to get to medical appointments. gets groceries delivered. independent with mobility without assistive device ADLs Comments: IND with ADL's        OT Problem List: Decreased strength;Decreased range of motion;Decreased safety awareness;Decreased activity tolerance;Impaired balance (sitting and/or standing)      OT Treatment/Interventions: Self-care/ADL training;Therapeutic activities;Therapeutic exercise;Energy conservation;Patient/family education    OT Goals(Current goals can be found in the care plan section) Acute Rehab OT Goals Patient Stated Goal: to get better OT Goal Formulation: With patient Time For Goal Achievement: 12/12/23 Potential to Achieve Goals: Good  OT Frequency: Min 1X/week    Co-evaluation PT/OT/SLP Co-Evaluation/Treatment: Yes  Reason for Co-Treatment: Complexity of the patient's impairments (multi-system involvement) PT goals addressed during session: Mobility/safety with mobility OT goals addressed during session: Strengthening/ROM;ADL's and self-care      AM-PAC OT "6 Clicks" Daily Activity     Outcome Measure Help from another person eating meals?: None Help from another person taking care of personal grooming?: None Help from another person toileting, which includes using toliet, bedpan, or urinal?: A  Lot Help from another person bathing (including washing, rinsing, drying)?: A Lot Help from another person to put on and taking off regular upper body clothing?: A Little Help from another person to put on and taking off regular lower body clothing?: A Lot 6 Click Score: 17   End of Session    Activity Tolerance: Patient tolerated treatment well Patient left: in bed;with call bell/phone within reach;with bed alarm set  OT Visit Diagnosis: Unsteadiness on feet (R26.81);Other abnormalities of gait and mobility (R26.89);Dizziness and giddiness (R42);Muscle weakness (generalized) (M62.81)                Time: 6213-0865 OT Time Calculation (min): 19 min Charges:     Butch Penny, SOT

## 2023-11-28 NOTE — Consult Note (Signed)
NEUROLOGY CONSULT NOTE   Date of service: November 28, 2023 Patient Name: Sharon Arias MRN:  [id number] DOB:  12-26-1960 Chief Complaint: acute ischemic stroke Requesting Provider: Lurene Shadow, MD  History of Present Illness    This is a 62 year old woman with a past medical history significant for hypertension, prior stroke without residual deficit, CHF, ESRD on dialysis who presents with vertigo for 2 days.  She was at dialysis on Friday when she was almost done with her session when she is turned her head to look to the right and back to the left and had a sudden sensation of the room spinning.  She reports that she has a history of BPPV and initially thought that that was what her symptoms were from however the vertigo did not stop.  She is seeing double and every time she would open her eyes she would vomit.  She currently has her left eye patch because this is the only way she can see.  She feels that her face is drooping on the left and this is new for her.  She reports no other focal deficits.  Patient presented well outside the window for TNK.  NIHSS components Score: Comment  1a Level of Conscious 0[x]  1[]  2[]  3[]      1b LOC Questions 0[x]  1[]  2[]       1c LOC Commands 0[x]  1[]  2[]       2 Best Gaze 0[]  1[x]  2[]       3 Visual 0[x]  1[]  2[]  3[]      4 Facial Palsy 0[]  1[]  2[]  3[x]      5a Motor Arm - left 0[x]  1[]  2[]  3[]  4[]  UN[]    5b Motor Arm - Right 0[x]  1[]  2[]  3[]  4[]  UN[]    6a Motor Leg - Left 0[x]  1[]  2[]  3[]  4[]  UN[]    6b Motor Leg - Right 0[x]  1[]  2[]  3[]  4[]  UN[]    7 Limb Ataxia 0[x]  1[]  2[]  3[]  UN[]     8 Sensory 0[x]  1[]  2[]  UN[]      9 Best Language 0[x]  1[]  2[]  3[]      10 Dysarthria 0[]  1[x]  2[]  UN[]      11 Extinct. and Inattention 0[x]  1[]  2[]       TOTAL:  5      ROS  Comprehensive ROS performed and pertinent positives documented in HPI   Past History   Past Medical History:  Diagnosis Date   Anemia in chronic kidney disease 05/19/2016   Anxiety    Aortic  atherosclerosis (HCC)    Blind    CAD (coronary artery disease)    Cardiac murmur    CHF (congestive heart failure) (HCC)    G2DD on 02/2020 TTE   Chicken pox    Chronic anticoagulation    CVA (cerebral vascular accident) (HCC)    DDD (degenerative disc disease), lumbar    Detached retina    Diabetic neuropathy (HCC)    Diabetic retinopathy (HCC)    legally blind   Dyspnea    ESRD needing dialysis (HCC)    Hx of CABG    Hyperlipidemia    Hypertension    NSTEMI (non-ST elevated myocardial infarction) (HCC) 06/2012   Obesity    Pneumonia    PONV (postoperative nausea and vomiting)    Sciatica of right side    Sleep apnea    CPAP NIGHTLY   T2DM (type 2 diabetes mellitus) (HCC)     Past Surgical History:  Procedure Laterality Date   A/V FISTULAGRAM Left 04/21/2021  Procedure: A/V FISTULAGRAM;  Surgeon: Renford Dills, MD;  Location: [facility];  Service: Cardiovascular;  Laterality: Left;   A/V FISTULAGRAM Left 11/02/2022   Procedure: A/V Fistulagram;  Surgeon: Renford Dills, MD;  Location: [facility];  Service: Cardiovascular;  Laterality: Left;   AV FISTULA PLACEMENT Left 02/06/2021   Procedure: ARTERIOVENOUS (AV) FISTULA CREATION (BRACHIAL CEPHALIC );  Surgeon: Renford Dills, MD;  Location: [facility];  Service: Vascular;  Laterality: Left;   COLONOSCOPY WITH PROPOFOL N/A 01/26/2016   Procedure: COLONOSCOPY WITH PROPOFOL;  Surgeon: Christena Deem, MD;  Location: [facility];  Service: Endoscopy;  Laterality: N/A;   COLONOSCOPY WITH PROPOFOL N/A 01/27/2016   Procedure: COLONOSCOPY WITH PROPOFOL;  Surgeon: Christena Deem, MD;  Location: [facility] ENDOSCOPY;  Service: Endoscopy;  Laterality: N/A;   CORONARY ARTERY BYPASS GRAFT  2013   DIALYSIS/PERMA CATHETER INSERTION N/A 02/03/2021   Procedure: DIALYSIS/PERMA CATHETER INSERTION;  Surgeon: Renford Dills, MD;  Location: [facility];  Service: Cardiovascular;  Laterality: N/A;    DIALYSIS/PERMA CATHETER INSERTION N/A 06/16/2021   Procedure: DIALYSIS/PERMA CATHETER INSERTION;  Surgeon: Annice Needy, MD;  Location: [facility];  Service: Cardiovascular;  Laterality: N/A;   DIALYSIS/PERMA CATHETER REMOVAL N/A 09/22/2021   Procedure: DIALYSIS/PERMA CATHETER REMOVAL;  Surgeon: Renford Dills, MD;  Location: [facility];  Service: Cardiovascular;  Laterality: N/A;   INCISION AND DRAINAGE     chest abscess   INCISION AND DRAINAGE ABSCESS N/A 12/11/2018   Procedure: INCISION AND DRAINAGE PERINEAL;  Surgeon: Leafy Ro, MD;  Location: [facility];  Service: General;  Laterality: N/A;   RETINAL LASER PROCEDURE      Family History: Family History  Problem Relation Age of Onset   Breast cancer Mother    Prostate cancer Father        we think mets to liver and bone    Social History  reports that she has never smoked. She has never been exposed to tobacco smoke. She has never used smokeless tobacco. She reports that she does not drink alcohol and does not use drugs.  Allergies  Allergen Reactions   Ozempic (0.25 Or 0.5 Mg-Dose) [Semaglutide(0.25 Or 0.5mg -Dos)] Diarrhea and Nausea Only   Trulicity [Dulaglutide] Diarrhea and Nausea Only   Other     Anesthesia--nausea/vomiting    Medications   Current Facility-Administered Medications:     stroke: early stages of recovery book, , Does not apply, Once, Gertha Calkin, MD   acetaminophen (TYLENOL) tablet 650 mg, 650 mg, Oral, Q4H PRN **OR** acetaminophen (TYLENOL) 160 MG/5ML solution 650 mg, 650 mg, Per Tube, Q4H PRN **OR** acetaminophen (TYLENOL) suppository 650 mg, 650 mg, Rectal, Q4H PRN, Gertha Calkin, MD   allopurinol (ZYLOPRIM) tablet 300 mg, 300 mg, Oral, q AM, Lurene Shadow, MD   aspirin chewable tablet 81 mg, 81 mg, Oral, Daily, Irena Cords V, MD, 81 mg at 11/27/23 1116   atorvastatin (LIPITOR) tablet 40 mg, 40 mg, Oral, Daily, Irena Cords V, MD, 40 mg at 11/27/23 1117   clopidogrel (PLAVIX)  tablet 75 mg, 75 mg, Oral, Daily, Irena Cords V, MD, 75 mg at 11/27/23 1117   heparin injection 5,000 Units, 5,000 Units, Subcutaneous, Q8H, Irena Cords V, MD, 5,000 Units at 11/28/23 0547   insulin aspart (novoLOG) injection 20 Units, 20 Units, Subcutaneous, TID WC, Lurene Shadow, MD   insulin glargine-yfgn (SEMGLEE) injection 40 Units, 40 Units, Subcutaneous, QHS, Lurene Shadow, MD, 40  Units at 11/27/23 2239   meclizine (ANTIVERT) tablet 25 mg, 25 mg, Oral, TID PRN, Lurene Shadow, MD, 25 mg at 11/27/23 2259   Oral care mouth rinse, 15 mL, Mouth Rinse, PRN, Lurene Shadow, MD   sevelamer carbonate (RENVELA) tablet 1,600 mg, 1,600 mg, Oral, TID Lorenza Chick, MD, 800 mg at 11/27/23 1632  Vitals   Vitals:   11/27/23 1900 11/27/23 2004 11/27/23 2352 11/28/23 0326  BP: (!) 161/55 (!) 179/67 (!) 170/64 (!) 159/60  Pulse: 62 69 68 62  Resp: 12 18  18   Temp:  98.9 F (37.2 C) 98.9 F (37.2 C) 98.8 F (37.1 C)  TempSrc:  Oral Oral Oral  SpO2: 91% 97% 99% 96%  Weight:      Height:        Body mass index is 39.16 kg/m.  Physical Exam   Constitutional: Appears well-developed and well-nourished.  Psych: Affect appropriate to situation.  Eyes: No scleral injection.  HENT: No OP obstruction.  Head: Normocephalic.  Cardiovascular: Normal rate and regular rhythm.  Respiratory: Effort normal, non-labored breathing.  GI: Soft.  No distension. There is no tenderness.  Skin: WDI.   Neurologic Examination    Physical Exam Gen: A&Ox4, NAD HEENT: Atraumatic, normocephalic; oropharynx clear, tongue without atrophy or fasciculations. Resp: CTAB, normal work of breathing CV: RRR, extremities appear well-perfused. Abd: soft/NT/ND Extrem: Nml bulk; no cyanosis, clubbing, or edema.  Neuro: *MS: A&O x4. Follows multi-step commands.  *Speech: mild dysarthria, no aphasia, able to name and repeat. *CN:    I: Deferred   II,III: PERRLA, VFF by confrontation, optic discs not visualized  2/2 pupillary constriction   III,IV,VI: partial L CN6 palsy, unable to bury L eye on Lward gaze   V: Sensation intact from V1 to V3 to LT   VII: L facial weakness upper and lower   VIII: Hearing intact to voice   IX,X: Voice normal, palate elevates symmetrically    XI: SCM/trap 5/5 bilat   XII: Tongue protrudes midline, no atrophy or fasciculations  *Motor:   Normal bulk.  No tremor, rigidity or bradykinesia. No pronator drift.   Strength: Dlt Bic Tri WE WrF FgS Gr HF KnF KnE PlF DoF    Left 5 5 5 5 5 5 5 5 5 5 5 5     Right 5 5 5 5 5 5 5 5 5 5 5 5    *Sensory: Intact to light touch, pinprick, temperature vibration throughout. Symmetric. Propioception intact bilat.  No double-simultaneous extinction.  *Coordination:  Finger-to-nose, heel-to-shin, rapid alternating motions were intact. *Reflexes:  2+ and symmetric throughout without clonus; toes down-going bilat *Gait: deferred  NIHSS = 5  mRS = 2  Labs/Imaging/Neurodiagnostic studies   CBC:  Recent Labs  Lab December 02, 2023 1228  WBC 13.1*  HGB 11.7*  HCT 34.8*  MCV 91.6  PLT 268   Basic Metabolic Panel:  Lab Results  Component Value Date   NA 138 2023/12/02   K 3.7 2023/12/02   CO2 24 02-Dec-2023   GLUCOSE 297 (H) 2023/12/02   BUN 36 (H) 12-02-2023   CREATININE 3.52 (H) 2023/12/02   CALCIUM 9.6 12/02/2023   GFRNONAA 14 (L) 02-Dec-2023   GFRAA 19 (L) 09/12/2020   Lipid Panel:  Lab Results  Component Value Date   LDLCALC 66 11/27/2023   HgbA1c:  Lab Results  Component Value Date   HGBA1C 8.1 (H) 2023/12/02   Urine Drug Screen:     Component Value Date/Time   LABOPIA NONE DETECTED  11/26/2023 1616   COCAINSCRNUR NONE DETECTED 11/26/2023 1616   LABBENZ NONE DETECTED 11/26/2023 1616   AMPHETMU NONE DETECTED 11/26/2023 1616   THCU NONE DETECTED 11/26/2023 1616   LABBARB NONE DETECTED 11/26/2023 1616    Alcohol Level No results found for: "ETH" INR  Lab Results  Component Value Date   INR 1.3 (H) 11/26/2023    APTT  Lab Results  Component Value Date   APTT 32 11/26/2023   AED levels: No results found for: "PHENYTOIN", "ZONISAMIDE", "LAMOTRIGINE", "LEVETIRACETA"  MRI brain wo contrast MRA H&N 1. Acute infarct in the brachium pontis on the left. 2. Severe narrowing in the ophthalmic segment of the right ICA. 3. The aortic arch, common carotid arteries, proximal internal carotid arteries, and V1 segments of bilateral vertebral arteries are poorly visualized and incompletely assessed due to the degree of respiratory motion artifact. The mid to distal segments of the carotid and vertebral arteries are normal in appearance.  MRI/MRA personally reviewed; I agree with above interpretation  No intracardiac clot or other significant abnl on TTE  Stroke Labs     Component Value Date/Time   CHOL 141 11/27/2023 0454   TRIG 167 (H) 11/27/2023 0454   HDL 42 11/27/2023 0454   CHOLHDL 3.4 11/27/2023 0454   VLDL 33 11/27/2023 0454   LDLCALC 66 11/27/2023 0454   LDLDIRECT 141.7 (H) 07/22/2019 0546    Lab Results  Component Value Date/Time   HGBA1C 8.1 (H) 11/26/2023 12:28 PM     ASSESSMENT   This is a 62 year old woman with a past medical history significant for hypertension, prior stroke without residual deficit, CHF, ESRD on dialysis who presents with vertigo for 2 days and palsies of the 6th and 7th cranial nerves on the L 2/2 acute ischemic infarct in the L brachium pontis. Etiology of infarct is small vessel disease. Stroke workup is now completed.  RECOMMENDATIONS   - Continue home aspirin and plavix - Atorvastatin 80mg  daily - Meclizine prn - PT/OT/SLP - I will arrange for outpatient f/u  Neurology to sign off, please re-engage if additional neurologic concerns arise ______________________________________________________________________    Signed, Jefferson Fuel, MD [facility]

## 2023-11-28 NOTE — Progress Notes (Signed)
[facility]  ROUNDING NOTE   Subjective:   [name] was admitted to [facility] on 11/26/2023 for Morbid obesity (HCC) [E66.01] Dizziness [R42] Acute ischemic stroke [facility]) [I63.9] Type 2 diabetes mellitus with hyperglycemia, with long-term current use of insulin (HCC) [E11.65, Z79.4]  Patient seen and evaluated during dialysis   HEMODIALYSIS FLOWSHEET:  Blood Flow Rate (mL/min): 399 mL/min Arterial Pressure (mmHg): -272.11 mmHg Venous Pressure (mmHg): 210.49 mmHg TMP (mmHg): 3.03 mmHg Ultrafiltration Rate (mL/min): 0 mL/min Dialysate Flow Rate (mL/min): 299 ml/min  Tolerating treatment well No complaints to offer at this time  Objective:  Vital signs in last 24 hours:  Temp:  [97.5 F (36.4 C)-98.9 F (37.2 C)] 98.7 F (37.1 C) (12/09 0836) Pulse Rate:  [56-69] 68 (12/09 1030) Resp:  [8-23] 10 (12/09 1030) BP: (98-179)/(47-112) 136/55 (12/09 1030) SpO2:  [91 %-100 %] 96 % (12/09 1030) Weight:  [111.4 kg] 111.4 kg (12/09 0904)  Weight change:  Filed Weights   11/26/23 1226 11/28/23 0904  Weight: 113.4 kg 111.4 kg    Intake/Output: I/O last 3 completed shifts: In: 1100 [P.O.:100; IV Piggyback:1000] Out: 450 [Urine:450]   Intake/Output this shift:  No intake/output data recorded.  Physical Exam: General: NAD  Head: Normocephalic, atraumatic. Moist oral mucosal membranes  Eyes: Left eye covered  Lungs:  Clear to auscultation, normal effort  Heart: Regular rate and rhythm  Abdomen:  Soft, nontender  Extremities:  no peripheral edema.  Neurologic: Nonfocal, moving all four extremities  Skin: No lesions  Access: Left AVF    Basic Metabolic Panel: Recent Labs  Lab 11/26/23 1228 11/28/23 0900  NA 138 138  K 3.7 3.8  CL 97* 104  CO2 24 22  GLUCOSE 297* 164*  BUN 36* 51*  CREATININE 3.52* 4.11*  CALCIUM 9.6 9.4  PHOS  --  4.9*    Liver Function Tests: Recent Labs  Lab 11/28/23 0900  ALBUMIN 3.7   No results for input(s):  "LIPASE", "AMYLASE" in the last 168 hours. No results for input(s): "AMMONIA" in the last 168 hours.  CBC: Recent Labs  Lab 11/26/23 1228 11/28/23 0900  WBC 13.1* 9.8  HGB 11.7* 11.4*  HCT 34.8* 34.0*  MCV 91.6 90.7  PLT 268 259    Cardiac Enzymes: No results for input(s): "CKTOTAL", "CKMB", "CKMBINDEX", "TROPONINI" in the last 168 hours.  BNP: Invalid input(s): "POCBNP"  CBG: Recent Labs  Lab 11/27/23 1124 11/27/23 1610 11/27/23 2238 11/28/23 0747 11/28/23 1047  GLUCAP 223* 233* 217* 158* 107*    Microbiology: Results for orders placed or performed during the hospital encounter of 04/17/21  SARS CORONAVIRUS 2 (TAT 6-24 HRS) Nasopharyngeal Nasopharyngeal Swab     Status: None   Collection Time: 04/17/21 10:12 AM   Specimen: Nasopharyngeal Swab  Result Value Ref Range Status   SARS Coronavirus 2 NEGATIVE NEGATIVE Final    Comment: (NOTE) SARS-CoV-2 target nucleic acids are NOT DETECTED.  The SARS-CoV-2 RNA is generally detectable in upper and lower respiratory specimens during the acute phase of infection. Negative results do not preclude SARS-CoV-2 infection, do not rule out co-infections with other pathogens, and should not be used as the sole basis for treatment or other patient management decisions. Negative results must be combined with clinical observations, patient history, and epidemiological information. The expected result is Negative.  Fact Sheet for Patients: HairSlick.no  Fact Sheet for Healthcare Providers: quierodirigir.com  This test is not yet approved or cleared by the [place] FDA and  has  been authorized for detection and/or diagnosis of SARS-CoV-2 by FDA under an Emergency Use Authorization (EUA). This EUA will remain  in effect (meaning this test can be used) for the duration of the COVID-19 declaration under Se ction 564(b)(1) of the Act, 21 U.S.C. section 360bbb-3(b)(1),  unless the authorization is terminated or revoked sooner.  Performed at [facility], [address], [place], [place] [address]     Coagulation Studies: Recent Labs    11/26/23 0018  LABPROT 16.0*  INR 1.3*    Urinalysis: No results for input(s): "COLORURINE", "LABSPEC", "PHURINE", "GLUCOSEU", "HGBUR", "BILIRUBINUR", "KETONESUR", "PROTEINUR", "UROBILINOGEN", "NITRITE", "LEUKOCYTESUR" in the last 72 hours.  Invalid input(s): "APPERANCEUR"    Imaging: ECHOCARDIOGRAM COMPLETE BUBBLE STUDY  Result Date: 11/27/2023    ECHOCARDIOGRAM REPORT   Patient Name:   Sharon Arias Date of Exam: 11/27/2023 Medical Rec #:  [id number]      Height:       67.0 in Accession #:    [id number]     Weight:       250.0 lb Date of Birth:  January 18, 1961       BSA:          2.223 m Patient Age:    62 years       BP:           175/59 mmHg Patient Gender: F              HR:           67 bpm. Exam Location:  [facility] Procedure: 2D Echo, Cardiac Doppler, Color Doppler and Saline Contrast Bubble            Study Indications:     Stroke 434.91 / I63.9  History:         Patient has prior history of Echocardiogram examinations. CHF;                  Risk Factors:Diabetes.  Sonographer:     Neysa Bonito Roar Referring Phys:  [id number] Eliezer Mccoy PATEL Diagnosing Phys: Jodelle Red MD IMPRESSIONS  1. Left ventricular ejection fraction, by estimation, is 55 to 60%. The left ventricle has normal function. The left ventricle has no regional wall motion abnormalities. There is mild left ventricular hypertrophy. Left ventricular diastolic parameters are indeterminate.  2. Right ventricular systolic function is normal. The right ventricular size is normal. Tricuspid regurgitation signal is inadequate for assessing PA pressure.  3. The mitral valve is degenerative. Trivial mitral valve regurgitation. Mild mitral stenosis.  4. The aortic valve was not well visualized. Aortic valve regurgitation is not visualized. Aortic valve sclerosis is  present, with no evidence of aortic valve stenosis.  5. Agitated saline contrast bubble study was negative, with no evidence of any interatrial shunt. Comparison(s): No significant change from prior study. Conclusion(s)/Recommendation(s): No intracardiac source of embolism detected on this transthoracic study. Consider a transesophageal echocardiogram to exclude cardiac source of embolism if clinically indicated. FINDINGS  Left Ventricle: Left ventricular ejection fraction, by estimation, is 55 to 60%. The left ventricle has normal function. The left ventricle has no regional wall motion abnormalities. The left ventricular internal cavity size was normal in size. There is  mild left ventricular hypertrophy. Left ventricular diastolic parameters are indeterminate. Right Ventricle: The right ventricular size is normal. Right vetricular wall thickness was not well visualized. Right ventricular systolic function is normal. Tricuspid regurgitation signal is inadequate for assessing PA pressure. Left Atrium: Left atrial size was normal in size.  Right Atrium: Right atrial size was normal in size. Pericardium: There is no evidence of pericardial effusion. Mitral Valve: The mitral valve is degenerative in appearance. There is mild thickening of the mitral valve leaflet(s). There is mild calcification of the mitral valve leaflet(s). Trivial mitral valve regurgitation. Mild mitral valve stenosis. MV peak gradient, 12.7 mmHg. The mean mitral valve gradient is 4.0 mmHg. Tricuspid Valve: The tricuspid valve is not well visualized. Tricuspid valve regurgitation is not demonstrated. No evidence of tricuspid stenosis. Aortic Valve: The aortic valve was not well visualized. Aortic valve regurgitation is not visualized. Aortic valve sclerosis is present, with no evidence of aortic valve stenosis. Aortic valve mean gradient measures 3.0 mmHg. Aortic valve peak gradient measures 6.7 mmHg. Aortic valve area, by VTI measures 1.80 cm.  Pulmonic Valve: The pulmonic valve was not well visualized. Pulmonic valve regurgitation is not visualized. No evidence of pulmonic stenosis. Aorta: The aortic root and ascending aorta are structurally normal, with no evidence of dilitation. IAS/Shunts: The interatrial septum appears to be lipomatous. The interatrial septum was not well visualized. Agitated saline contrast was given intravenously to evaluate for intracardiac shunting. Agitated saline contrast bubble study was negative, with no evidence of any interatrial shunt.  LEFT VENTRICLE PLAX 2D LVIDd:         5.30 cm   Diastology LVIDs:         4.00 cm   LV e' medial:    4.68 cm/s LV PW:         1.20 cm   LV E/e' medial:  31.2 LV IVS:        1.30 cm   LV e' lateral:   6.96 cm/s LVOT diam:     1.70 cm   LV E/e' lateral: 21.0 LV SV:         54 LV SV Index:   24 LVOT Area:     2.27 cm  RIGHT VENTRICLE RV Basal diam:  3.60 cm RV Mid diam:    3.00 cm RV S prime:     8.05 cm/s TAPSE (M-mode): 2.0 cm LEFT ATRIUM             Index        RIGHT ATRIUM           Index LA diam:        4.00 cm 1.80 cm/m   RA Area:     15.60 cm LA Vol (A2C):   51.0 ml 22.94 ml/m  RA Volume:   35.10 ml  15.79 ml/m LA Vol (A4C):   56.3 ml 25.32 ml/m LA Biplane Vol: 53.4 ml 24.02 ml/m  AORTIC VALVE                    PULMONIC VALVE AV Area (Vmax):    1.60 cm     PV Vmax:        1.15 m/s AV Area (Vmean):   1.71 cm     PV Peak grad:   5.3 mmHg AV Area (VTI):     1.80 cm     RVOT Peak grad: 2 mmHg AV Vmax:           129.00 cm/s AV Vmean:          84.900 cm/s AV VTI:            0.300 m AV Peak Grad:      6.7 mmHg AV Mean Grad:      3.0 mmHg LVOT Vmax:  91.00 cm/s LVOT Vmean:        63.900 cm/s LVOT VTI:          0.238 m LVOT/AV VTI ratio: 0.79  AORTA Ao Asc diam: 2.90 cm MITRAL VALVE MV Area (PHT): 5.16 cm     SHUNTS MV Area VTI:   1.22 cm     Systemic VTI:  0.24 m MV Peak grad:  12.7 mmHg    Systemic Diam: 1.70 cm MV Mean grad:  4.0 mmHg MV Vmax:       1.78 m/s MV Vmean:       85.6 cm/s MV Decel Time: 147 msec MV E velocity: 146.00 cm/s MV A velocity: 91.70 cm/s MV E/A ratio:  1.59 MV A Prime:    8.9 cm/s Jodelle Red MD Electronically signed by Jodelle Red MD Signature Date/Time: 11/27/2023/4:54:31 PM    Final    DG Chest 1 View  Result Date: 11/27/2023 CLINICAL DATA:  62 year old female with left brainstem infarct. EXAM: CHEST  1 VIEW COMPARISON:  Chest radiographs 02/05/2021. FINDINGS: Portable AP semi upright view at 0421 hours. More lordotic positioning. Chronic sternotomy, CABG. Stable mild cardiomegaly mediastinal contours. Visualized tracheal air column is within normal limits. Allowing for portable technique the lungs are clear. No pneumothorax or pleural effusion. Paucity of bowel gas. No acute osseous abnormality identified. IMPRESSION: Chronic cardiac surgery.  No acute cardiopulmonary abnormality. Electronically Signed   By: Odessa Fleming M.D.   On: 11/27/2023 05:31   MR BRAIN WO CONTRAST  Result Date: 11/26/2023 CLINICAL DATA:  Neuro deficit, acute, stroke suspected; Vertebral artery dissection suspected; Vertigo, central EXAM: MRI HEAD WITHOUT CONTRAST MRA HEAD WITHOUT CONTRAST MRA NECK WITHOUT CONTRAST TECHNIQUE: Multiplanar, multi-echo pulse sequences of the brain and surrounding structures were acquired without intravenous contrast. Angiographic images of the Circle of Willis were acquired using MRA technique without intravenous contrast. Angiographic images of the neck were acquired using MRA technique without intravenous contrast. Carotid stenosis measurements (when applicable) are obtained utilizing NASCET criteria, using the distal internal carotid diameter as the denominator. COMPARISON:  None Available. FINDINGS: MRI HEAD FINDINGS Brain: There acute infarcts in the brachium pontis on the left (series 8, image 10). No hemorrhage. No hydrocephalus. No extra-axial fluid collection. No mass effect. No mass lesion. Background of chronic microvascular  ischemic change with a chronic infarct in the right parietal lobe. Vascular: Normal flow voids. Skull and upper cervical spine: Normal marrow signal. Sinuses/Orbits: No middle ear or mastoid effusion. Paranasal sinuses are clear. Bilateral lens replacement. Orbits are otherwise unremarkable. Other: None. MRA HEAD FINDINGS Anterior circulation: Severe narrowing in the ophthalmic segment of the right ICA (series 1, image 71). No aneurysm. No vascular malformation. No occlusion. Posterior circulation: No aneurysm. No occlusion. No significant stenosis. Anatomic variants: None MRA NECK FINDINGS Aortic arch: Not visualized, likely to respiratory motion artifact. Right carotid system: The right common carotid artery in the proximal aspect of the right internal carotid artery are poorly visualized and incompletely assessed due to the degree of respiratory motion artifact. The mid to distal segments of the right ICA are normal in appearance. Left carotid system: The left common carotid artery in the proximal aspect of the left internal carotid artery are poorly visualized and incompletely assessed due to the degree of respiratory motion artifact. The mid to distal segments of the left ICA are normal in appearance. Vertebral arteries: The V1 segments of bilateral vertebral arteries are not visualized due to the degree of respiratory motion artifact. The distal V2  segments in the V4 segments are normal in appearance. Other: None. IMPRESSION: 1. Acute infarct in the brachium pontis on the left. 2. Severe narrowing in the ophthalmic segment of the right ICA. 3. The aortic arch, common carotid arteries, proximal internal carotid arteries, and V1 segments of bilateral vertebral arteries are poorly visualized and incompletely assessed due to the degree of respiratory motion artifact. The mid to distal segments of the carotid and vertebral arteries are normal in appearance. Electronically Signed   By: Lorenza Cambridge M.D.   On:  11/26/2023 19:28   MR ANGIO HEAD WO CONTRAST  Result Date: 11/26/2023 CLINICAL DATA:  Neuro deficit, acute, stroke suspected; Vertebral artery dissection suspected; Vertigo, central EXAM: MRI HEAD WITHOUT CONTRAST MRA HEAD WITHOUT CONTRAST MRA NECK WITHOUT CONTRAST TECHNIQUE: Multiplanar, multi-echo pulse sequences of the brain and surrounding structures were acquired without intravenous contrast. Angiographic images of the Circle of Willis were acquired using MRA technique without intravenous contrast. Angiographic images of the neck were acquired using MRA technique without intravenous contrast. Carotid stenosis measurements (when applicable) are obtained utilizing NASCET criteria, using the distal internal carotid diameter as the denominator. COMPARISON:  None Available. FINDINGS: MRI HEAD FINDINGS Brain: There acute infarcts in the brachium pontis on the left (series 8, image 10). No hemorrhage. No hydrocephalus. No extra-axial fluid collection. No mass effect. No mass lesion. Background of chronic microvascular ischemic change with a chronic infarct in the right parietal lobe. Vascular: Normal flow voids. Skull and upper cervical spine: Normal marrow signal. Sinuses/Orbits: No middle ear or mastoid effusion. Paranasal sinuses are clear. Bilateral lens replacement. Orbits are otherwise unremarkable. Other: None. MRA HEAD FINDINGS Anterior circulation: Severe narrowing in the ophthalmic segment of the right ICA (series 1, image 71). No aneurysm. No vascular malformation. No occlusion. Posterior circulation: No aneurysm. No occlusion. No significant stenosis. Anatomic variants: None MRA NECK FINDINGS Aortic arch: Not visualized, likely to respiratory motion artifact. Right carotid system: The right common carotid artery in the proximal aspect of the right internal carotid artery are poorly visualized and incompletely assessed due to the degree of respiratory motion artifact. The mid to distal segments of the  right ICA are normal in appearance. Left carotid system: The left common carotid artery in the proximal aspect of the left internal carotid artery are poorly visualized and incompletely assessed due to the degree of respiratory motion artifact. The mid to distal segments of the left ICA are normal in appearance. Vertebral arteries: The V1 segments of bilateral vertebral arteries are not visualized due to the degree of respiratory motion artifact. The distal V2 segments in the V4 segments are normal in appearance. Other: None. IMPRESSION: 1. Acute infarct in the brachium pontis on the left. 2. Severe narrowing in the ophthalmic segment of the right ICA. 3. The aortic arch, common carotid arteries, proximal internal carotid arteries, and V1 segments of bilateral vertebral arteries are poorly visualized and incompletely assessed due to the degree of respiratory motion artifact. The mid to distal segments of the carotid and vertebral arteries are normal in appearance. Electronically Signed   By: Lorenza Cambridge M.D.   On: 11/26/2023 19:28   MR ANGIO NECK WO CONTRAST  Result Date: 11/26/2023 CLINICAL DATA:  Neuro deficit, acute, stroke suspected; Vertebral artery dissection suspected; Vertigo, central EXAM: MRI HEAD WITHOUT CONTRAST MRA HEAD WITHOUT CONTRAST MRA NECK WITHOUT CONTRAST TECHNIQUE: Multiplanar, multi-echo pulse sequences of the brain and surrounding structures were acquired without intravenous contrast. Angiographic images of the Circle of Willis were  acquired using MRA technique without intravenous contrast. Angiographic images of the neck were acquired using MRA technique without intravenous contrast. Carotid stenosis measurements (when applicable) are obtained utilizing NASCET criteria, using the distal internal carotid diameter as the denominator. COMPARISON:  None Available. FINDINGS: MRI HEAD FINDINGS Brain: There acute infarcts in the brachium pontis on the left (series 8, image 10). No hemorrhage.  No hydrocephalus. No extra-axial fluid collection. No mass effect. No mass lesion. Background of chronic microvascular ischemic change with a chronic infarct in the right parietal lobe. Vascular: Normal flow voids. Skull and upper cervical spine: Normal marrow signal. Sinuses/Orbits: No middle ear or mastoid effusion. Paranasal sinuses are clear. Bilateral lens replacement. Orbits are otherwise unremarkable. Other: None. MRA HEAD FINDINGS Anterior circulation: Severe narrowing in the ophthalmic segment of the right ICA (series 1, image 71). No aneurysm. No vascular malformation. No occlusion. Posterior circulation: No aneurysm. No occlusion. No significant stenosis. Anatomic variants: None MRA NECK FINDINGS Aortic arch: Not visualized, likely to respiratory motion artifact. Right carotid system: The right common carotid artery in the proximal aspect of the right internal carotid artery are poorly visualized and incompletely assessed due to the degree of respiratory motion artifact. The mid to distal segments of the right ICA are normal in appearance. Left carotid system: The left common carotid artery in the proximal aspect of the left internal carotid artery are poorly visualized and incompletely assessed due to the degree of respiratory motion artifact. The mid to distal segments of the left ICA are normal in appearance. Vertebral arteries: The V1 segments of bilateral vertebral arteries are not visualized due to the degree of respiratory motion artifact. The distal V2 segments in the V4 segments are normal in appearance. Other: None. IMPRESSION: 1. Acute infarct in the brachium pontis on the left. 2. Severe narrowing in the ophthalmic segment of the right ICA. 3. The aortic arch, common carotid arteries, proximal internal carotid arteries, and V1 segments of bilateral vertebral arteries are poorly visualized and incompletely assessed due to the degree of respiratory motion artifact. The mid to distal segments of  the carotid and vertebral arteries are normal in appearance. Electronically Signed   By: Lorenza Cambridge M.D.   On: 11/26/2023 19:28     Medications:    anticoagulant sodium citrate       stroke: early stages of recovery book   Does not apply Once   allopurinol  300 mg Oral q AM   aspirin  81 mg Oral Daily   atorvastatin  80 mg Oral Daily   clopidogrel  75 mg Oral Daily   heparin  5,000 Units Subcutaneous Q8H   insulin aspart  20 Units Subcutaneous TID WC   insulin glargine-yfgn  40 Units Subcutaneous QHS   ondansetron (ZOFRAN) IV  4 mg Intravenous Once   sevelamer carbonate  1,600 mg Oral TID AC   acetaminophen **OR** acetaminophen (TYLENOL) oral liquid 160 mg/5 mL **OR** acetaminophen, alteplase, anticoagulant sodium citrate, feeding supplement (NEPRO CARB STEADY), heparin, lidocaine (PF), lidocaine-prilocaine, meclizine, mouth rinse, pentafluoroprop-tetrafluoroeth  Assessment/ Plan:  [name] is a 62 y.o.  female with end stage renal disease on hemodialysis, hypertension, hyperlipidemia, diabetes mellitus type II, coronary artery disease, CVA, diabetes neuropathy, diabetes retinopathy, sleep apnea who is admitted to [facility] on 11/26/2023 for Morbid obesity (HCC) [E66.01] Dizziness [R42] Acute ischemic stroke (HCC) [I63.9] Type 2 diabetes mellitus with hyperglycemia, with long-term current use of insulin (HCC) [E11.65, Z79.4]  [facility] M[facility] left AVF  115kg  End Stage Renal Disease: completed hemodialysis treatment on 12/6.  -Patient receiving dialysis today, UF goal 1.5 L as tolerated - Next treatment scheduled for Wednesday.  Hypertension: with acute CVA.  - Allow for permissive hypertension.  - Home regimen of amlodipine, furosemide, losartan, and metoprolol.  -Neurology to follow-up outpatient.  Anemia with chronic kidney disease: hemoglobin 11. 4, within desired range.  No need for ESA's at this time.  Secondary Hyperparathyroidism -Calcium acceptable at  9.4.  Phosphorus slightly elevated but acceptable at 4.9.  Will reinitiate binders when needed.   LOS: 0 Alacia Rehmann 12/9/202410:57 AM

## 2023-11-28 NOTE — Progress Notes (Signed)
Initial Nutrition Assessment  DOCUMENTATION CODES:   Obesity unspecified  INTERVENTION:   -Liberalize diet to carb modified for wider variety of meal selections -Renal MVI daily -30 ml Prosource Plus BID, each supplement provides 100 kcals and 15 grams protein  NUTRITION DIAGNOSIS:   Increased nutrient needs related to chronic illness (ESRD on HD) as evidenced by estimated needs.  GOAL:   Patient will meet greater than or equal to 90% of their needs  MONITOR:   PO intake, Supplement acceptance  REASON FOR ASSESSMENT:   Consult Assessment of nutrition requirement/status  ASSESSMENT:   Pt with medical history significant for history of stroke, CAD s/p CABG and coronary stent on aspirin and Plavix, chronic diastolic CHF, ESRD on hemodialysis, type II DM, diabetic neuropathy, legal blindness, anemia of chronic disease, sleep apnea, obesity. Admitted with ischemic stroke.  Pt admitted with acute ischemic stroke.   Reviewed I/O's: -450 ml x 24 hours and +650 ml since admission  UOP: 450 ml x 24 hours  Pt sleeping soundly at time of visit, just returned from HD at time of visit. She did not respond to voice or touch. No family at bedside to provide additional history.   Observed meal tray in front of pt, which she consumed 100%. She is currently on a renal, carb modified diet.   Reviewed wt hx; pt has experienced a 3.3% wt loss over the past month, which is not significant for time frame. Per nephrology notes, EDW is 115 kg. Pt is below dry wt.   CIR following for potential admission.   Medications reviewed include lasix, zofran, and renvela.   Lab Results  Component Value Date   HGBA1C 8.1 (H) 11/26/2023   PTA DM medications are 75 units insulin degludec daily, 20 units insulin lispro daily at breakfast, 42 units insulin lispro daily at lunch, and 52 units insulin lispro daily at dinner.   Labs reviewed: K WDL, Phos: 4.9, CBGS: 107-233 (inpatient orders for glycemic  control are 20 units insulin aspart TID with meals and 40 units insulin glargine-yfgn daily at bedtime).    NUTRITION - FOCUSED PHYSICAL EXAM:  Flowsheet Row Most Recent Value  Orbital Region No depletion  Upper Arm Region No depletion  Thoracic and Lumbar Region No depletion  Buccal Region No depletion  Temple Region No depletion  Clavicle Bone Region No depletion  Clavicle and Acromion Bone Region No depletion  Scapular Bone Region No depletion  Dorsal Hand No depletion  Patellar Region No depletion  Anterior Thigh Region No depletion  Posterior Calf Region No depletion  Edema (RD Assessment) Mild  Hair Reviewed  Eyes Reviewed  Mouth Reviewed  Skin Reviewed  Nails Reviewed       Diet Order:   Diet Order             Diet renal/carb modified with fluid restriction Diet-HS Snack? Nothing; Fluid restriction: 1200 mL Fluid; Room service appropriate? Yes; Fluid consistency: Thin  Diet effective now                   EDUCATION NEEDS:   No education needs have been identified at this time  Skin:  Skin Assessment: Reviewed RN Assessment  Last BM:  11/25/23  Height:   Ht Readings from Last 1 Encounters:  11/26/23 5\' 7"  (1.702 m)    Weight:   Wt Readings from Last 1 Encounters:  11/28/23 111 kg    Ideal Body Weight:  61.4 kg  BMI:  Body mass index  is 38.33 kg/m.  Estimated Nutritional Needs:   Kcal:  1600-1800  Protein:  90-105 grams  Fluid:  1000 ml + UOP    Levada Schilling, RD, LDN, CDCES Registered Dietitian III Certified Diabetes Care and Education Specialist Please refer to [facility] for RD and/or RD on-call/weekend/after hours pager

## 2023-11-29 DIAGNOSIS — I639 Cerebral infarction, unspecified: Secondary | ICD-10-CM | POA: Diagnosis not present

## 2023-11-29 DIAGNOSIS — R42 Dizziness and giddiness: Secondary | ICD-10-CM | POA: Diagnosis not present

## 2023-11-29 LAB — GLUCOSE, CAPILLARY
Glucose-Capillary: 169 mg/dL — ABNORMAL HIGH (ref 70–99)
Glucose-Capillary: 141 mg/dL — ABNORMAL HIGH (ref 70–99)
Glucose-Capillary: 121 mg/dL — ABNORMAL HIGH (ref 70–99)

## 2023-11-29 LAB — MRSA NEXT GEN BY PCR, NASAL: MRSA by PCR Next Gen: NOT DETECTED

## 2023-11-29 LAB — HEPATITIS B SURFACE ANTIBODY, QUANTITATIVE: Hep B S AB Quant (Post): 62.6 m[IU]/mL

## 2023-11-29 MED ORDER — CHLORHEXIDINE GLUCONATE CLOTH 2 % EX PADS
6.0000 | MEDICATED_PAD | Freq: Every day | CUTANEOUS | Status: DC
  Administered 2023-11-30 – 2023-12-02 (×3): 6 via TOPICAL

## 2023-11-29 MED ORDER — SEVELAMER CARBONATE 800 MG PO TABS
800.0000 mg | ORAL_TABLET | Freq: Three times a day (TID) | ORAL | Status: DC
Start: 2023-11-29 — End: 2023-12-02
  Administered 2023-11-29 – 2023-11-30 (×2): 800 mg via ORAL
  Filled 2023-11-29 (×4): qty 1

## 2023-11-29 NOTE — Progress Notes (Signed)
Physical Therapy Treatment Patient Details Name: Sharon Arias MRN: [id number] DOB: 1961/02/23 Today's Date: 11/29/2023   History of Present Illness Patient is a 62 year old female who presents with vertigo and palsies of 6th and 7th cranial nerves on the left. Found to have acute ischemic infarct in the left brachium pontis. History of ESRD on hemodialysis, hypertension, hyperlipidemia, diabetes mellitus type II, CAD, CVA, neuropathy, retinopathy, sleep apnea    PT Comments  Increased activity tolerance this session with continued dizziness reported with mobility. The patient required Min A for standing from bed. Steadying assistance provided for short distance ambulation with rolling walker. Education provided for strategies to decrease dizziness with mobility efforts. Patient remains very motivated to participate and regain independence. Recommend to continue PT to maximize independence. Anticipate patient could benefit from rehabilitation > 3 hours/day after this hospital stay.        If plan is discharge home, recommend the following: A lot of help with walking and/or transfers;A little help with bathing/dressing/bathroom;Assistance with cooking/housework;Assist for transportation;Help with stairs or ramp for entrance   Can travel by private vehicle        Equipment Recommendations  None recommended by PT    Recommendations for Other Services Rehab consult     Precautions / Restrictions Precautions Precautions: Fall Restrictions Weight Bearing Restrictions: No     Mobility  Bed Mobility Overal bed mobility: Needs Assistance Bed Mobility: Supine to Sit     Supine to sit: Contact guard, HOB elevated          Transfers Overall transfer level: Needs assistance Equipment used: Rolling walker (2 wheels) Transfers: Sit to/from Stand Sit to Stand: Min assist           General transfer comment: steadying assistance provided. cues for hand placement.     Ambulation/Gait Ambulation/Gait assistance: Min assist Gait Distance (Feet): 10 Feet Assistive device: Rolling walker (2 wheels) Gait Pattern/deviations: Step-to pattern, Decreased step length - left, Decreased stride length Gait velocity: decreased     General Gait Details: short distance ambulation performed with steadying assistance required. patient does have incresed trunk sway intermittently. she did have both eyes opened during mobility efforts but feels less dizziness with right eye closed. encouraged patient to move head slowly during mobility efforts and to focus on object for decreased dizziness   Stairs             Wheelchair Mobility     Tilt Bed    Modified Rankin (Stroke Patients Only)       Balance Overall balance assessment: Needs assistance Sitting-balance support: Feet supported Sitting balance-Leahy Scale: Fair     Standing balance support: Bilateral upper extremity supported Standing balance-Leahy Scale: Poor Standing balance comment: external support required                            Cognition Arousal: Alert Behavior During Therapy: WFL for tasks assessed/performed Overall Cognitive Status: Within Functional Limits for tasks assessed                                          Exercises      General Comments        Pertinent Vitals/Pain Pain Assessment Pain Assessment: No/denies pain    Home Living     Available Help at Discharge: Available PRN/intermittently;Family Type of Home: House  Prior Function            PT Goals (current goals can now be found in the care plan section) Acute Rehab PT Goals Patient Stated Goal: to have less dizziness PT Goal Formulation: With patient Time For Goal Achievement: 12/12/23 Potential to Achieve Goals: Good Progress towards PT goals: Progressing toward goals    Frequency    Min 1X/week      PT Plan      Co-evaluation               AM-PAC PT "6 Clicks" Mobility   Outcome Measure  Help needed turning from your back to your side while in a flat bed without using bedrails?: A Little Help needed moving from lying on your back to sitting on the side of a flat bed without using bedrails?: A Little Help needed moving to and from a bed to a chair (including a wheelchair)?: A Lot Help needed standing up from a chair using your arms (e.g., wheelchair or bedside chair)?: A Lot Help needed to walk in hospital room?: Total Help needed climbing 3-5 steps with a railing? : Total 6 Click Score: 12    End of Session   Activity Tolerance: Patient tolerated treatment well Patient left: in bed (seated on bed per patient preference) Nurse Communication: Mobility status PT Visit Diagnosis: Difficulty in walking, not elsewhere classified (R26.2);Other symptoms and signs involving the nervous system (R29.898)     Time: 1610-9604 PT Time Calculation (min) (ACUTE ONLY): 20 min  Charges:    $Therapeutic Activity: 8-22 mins PT General Charges $$ ACUTE PT VISIT: 1 Visit                     Donna Bernard, PT, MPT    Ina Homes 11/29/2023, 11:41 AM

## 2023-11-29 NOTE — Progress Notes (Signed)
Inpatient Rehab Coordinator Note:  I spoke with patient and her daughter over the phone to discuss CIR recommendations and goals/expectations of CIR stay.  We reviewed 3 hrs/day of therapy, physician follow up, and average length of stay 2 weeks (dependent upon progress) with goals of supervision to modified independent.  We reviewed potential need for 24/7 supervision at discharge and daughter confirms she can provide this.  We reviewed [facility] Medicare benefits and pt confirms this is her primary payor.  I have started insurance auth request and will follow.   Estill Dooms, PT, DPT Admissions Coordinator [phone] 11/29/23  3:39 PM

## 2023-11-29 NOTE — Progress Notes (Signed)
Progress Note    Sharon Arias  ZOX:[id number] DOB: 1961-08-07  DOA: 11/26/2023 PCP: Marguarite Arbour, MD      Brief Narrative:    Medical records reviewed and are as summarized below:  Sharon Arias is a 61 y.o. female with medical history significant for history of stroke, CAD s/p CABG and coronary stent on aspirin and Plavix, chronic diastolic CHF, ESRD on hemodialysis, type II DM, diabetic neuropathy, legal blindness, anemia of chronic disease, sleep apnea, obesity.  She presented to the hospital because of nausea, vomiting and dizziness.  She describes the dizziness as spinning sensation, as if the room is spinning.  She felt dizzy on 11/25/2023 around 10 AM.  She did not seek medical attention because she thought she would be okay.  She went for hemodialysis that same day.  However, when she got home, her symptoms had worsened.  She had nausea and multiple episodes of vomiting.  She presented to the hospital the following day because of worsening symptoms.   She was found to have acute stroke.    Assessment/Plan:   Principal Problem:   Dizziness Active Problems:   Acute CVA (cerebrovascular accident) (HCC)   Anemia of chronic kidney failure, stage 4 (severe) (HCC)   Coronary artery disease   Type 2 diabetes mellitus with renal complication (HCC)   HTN (hypertension)   ESRD (end stage renal disease) on dialysis (HCC)    Body mass index is 38.33 kg/m.  (Obesity)   Acute ischemic stroke in the left brachium pontis with vomiting and vertigo, severe narrowing in the ophthalmic segment of the right ICA, now with left facial droop: Continue aspirin, Plavix and statin.  Antiemetics as needed.  Continue meclizine.   She may be a candidate for rehab at acute inpatient rehab.  Follow-up with TOC to assist with disposition.   ESRD: Plan for hemodialysis tomorrow.  Follow-up with nephrologist.   Chronic diastolic CHF: Compensated 2D echo showed EF estimated at 55 to 60%,  indeterminate LV diastolic parameters, mild mitral stenosis, no evidence of interatrial shunt   CAD s/p CABG and coronary stent: Continue aspirin and Plavix   Type 2 DM with hyperglycemia:NovoLog as needed for hyperglycemia.  Continue insulin glargine at 40 units nightly and NovoLog 20 units 3 times daily with meals.  Hemoglobin A1c 8.1 Home insulin regimen: Tresiba 75 mg nightly, Humalog 20 units in the morning, 42 units at lunch and 52 units at dinner.   Hyperlipidemia: Continue Lipitor Lipid panel showed LDL 66, HDL 42, triglycerides 167, total cholesterol 141   Hypertension: Continue antihypertensives (hydralazine, losartan, metoprolol and amlodipine   Sinus bradycardia, occasional sinus pauses (up to 2 seconds) on telemetry: She is asymptomatic.  She is on metoprolol.  Monitor heart rate closely.   Comorbidities include gout, anemia of chronic disease, sleep apnea,    Diet Order             Diet Carb Modified Fluid consistency: Thin  Diet effective now                            Consultants: Nephrologist Neurologist  Procedures: None    Medications:     stroke: early stages of recovery book   Does not apply Once   (feeding supplement) PROSource Plus  30 mL Oral BID BM   allopurinol  300 mg Oral q AM   amLODipine  5 mg Oral QHS   aspirin  81 mg Oral Daily   atorvastatin  80 mg Oral Daily   clopidogrel  75 mg Oral Daily   furosemide  40 mg Oral Q T,Th,S,Su   heparin  5,000 Units Subcutaneous Q8H   hydrALAZINE  50 mg Oral BID   insulin aspart  20 Units Subcutaneous TID WC   insulin glargine-yfgn  40 Units Subcutaneous QHS   losartan  100 mg Oral Daily   metoprolol tartrate  50 mg Oral BID   multivitamin  1 tablet Oral QHS   ondansetron (ZOFRAN) IV  4 mg Intravenous Once   sevelamer carbonate  800 mg Oral TID AC   Continuous Infusions:     Anti-infectives (From admission, onward)    None              Family  Communication/Anticipated D/C date and plan/Code Status   DVT prophylaxis: heparin injection 5,000 Units Start: 11/26/23 2200     Code Status: Full Code  Family Communication: None Disposition Plan: Plan to discharge to SNF   Status is: Inpatient Remains inpatient appropriate because: Acute stroke         Subjective:   Interval events noted.  She still complains of dizziness/spinning sensation.  No chest pain, shortness of breath, palpitations  Objective:    Vitals:   11/28/23 2343 11/29/23 0454 11/29/23 0747 11/29/23 1348  BP: 120/64 126/63 (!) 135/48 (!) 127/50  Pulse: 60 (!) 58 (!) 55 (!) 58  Resp:  18 18 17   Temp: 98.4 F (36.9 C) 98.5 F (36.9 C) 98.1 F (36.7 C) 98.4 F (36.9 C)  TempSrc: Oral Oral  Oral  SpO2: 95% 98% 94% 96%  Weight:      Height:       No data found.   Intake/Output Summary (Last 24 hours) at 11/29/2023 1430 Last data filed at 11/29/2023 0503 Gross per 24 hour  Intake 250 ml  Output 650 ml  Net -400 ml   Filed Weights   11/26/23 1226 11/28/23 0904 11/28/23 1220  Weight: 113.4 kg 111.4 kg 111 kg    Exam:  GEN: NAD SKIN: Warm and dry EYES: No pallor or icterus ENT: MMM CV: RRR PULM: CTA B ABD: soft, obese, NT, +BS CNS: AAO x 3, mild left facial droop EXT: No edema or tenderness       Data Reviewed:   I have personally reviewed following labs and imaging studies:  Labs: Labs show the following:   Basic Metabolic Panel: Recent Labs  Lab 11/26/23 1228 11/28/23 0900  NA 138 138  K 3.7 3.8  CL 97* 104  CO2 24 22  GLUCOSE 297* 164*  BUN 36* 51*  CREATININE 3.52* 4.11*  CALCIUM 9.6 9.4  PHOS  --  4.9*   GFR Estimated Creatinine Clearance: 18.2 mL/min (A) (by C-G formula based on SCr of 4.11 mg/dL (H)). Liver Function Tests: Recent Labs  Lab 11/28/23 0900  ALBUMIN 3.7   No results for input(s): "LIPASE", "AMYLASE" in the last 168 hours. No results for input(s): "AMMONIA" in the last 168  hours. Coagulation profile Recent Labs  Lab 11/26/23 0018  INR 1.3*    CBC: Recent Labs  Lab 11/26/23 1228 11/28/23 0900  WBC 13.1* 9.8  HGB 11.7* 11.4*  HCT 34.8* 34.0*  MCV 91.6 90.7  PLT 268 259   Cardiac Enzymes: No results for input(s): "CKTOTAL", "CKMB", "CKMBINDEX", "TROPONINI" in the last 168 hours. BNP (last 3 results) No results for input(s): "PROBNP" in the last 8760 hours.  CBG: Recent Labs  Lab 11/28/23 1120 11/28/23 1622 11/28/23 2218 11/29/23 0749 11/29/23 1146  GLUCAP 110* 238* 223* 169* 141*   D-Dimer: No results for input(s): "DDIMER" in the last 72 hours. Hgb A1c: No results for input(s): "HGBA1C" in the last 72 hours.  Lipid Profile: Recent Labs    11/27/23 0454  CHOL 141  HDL 42  LDLCALC 66  TRIG 167*  CHOLHDL 3.4   Thyroid function studies: No results for input(s): "TSH", "T4TOTAL", "T3FREE", "THYROIDAB" in the last 72 hours.  Invalid input(s): "FREET3" Anemia work up: No results for input(s): "VITAMINB12", "FOLATE", "FERRITIN", "TIBC", "IRON", "RETICCTPCT" in the last 72 hours. Sepsis Labs: Recent Labs  Lab 11/26/23 1228 11/28/23 0900  WBC 13.1* 9.8    Microbiology No results found for this or any previous visit (from the past 240 hour(s)).  Procedures and diagnostic studies:  No results found.             LOS: 1 day   Aiden Helzer  [facility]   Pager on [email or web address]. If 7PM-7AM, please contact night-coverage at [email or web address]     11/29/2023, 2:30 PM

## 2023-11-29 NOTE — Discharge Planning (Signed)
ESTABLISHED HEMODIALYSIS Outpatient Facility [facility]  [address] [place], [place] [address] [phone]  Schedule: MWF 7:00am  Dimas Chyle Dialysis Coordinator II  [facility] Cell: [phone] eFax: [phone] [email or web address]

## 2023-11-29 NOTE — Progress Notes (Signed)
[facility]  ROUNDING NOTE   Subjective:   Ms. AMANDO JOYNER was admitted to [facility] on 11/26/2023 for Morbid obesity (HCC) [E66.01] Dizziness [R42] Acute ischemic stroke [facility]) [I63.9] Type 2 diabetes mellitus with hyperglycemia, with long-term current use of insulin (HCC) [E11.65, Z79.4]  Update  Patient seen sitting up in bed, currently eating breakfast Able to sit on side of bed with therapy Reports dizziness, room spinning when sitting at side of bed  Objective:  Vital signs in last 24 hours:  Temp:  [97.4 F (36.3 C)-98.5 F (36.9 C)] 98.1 F (36.7 C) (12/10 0747) Pulse Rate:  [55-64] 55 (12/10 0747) Resp:  [14-19] 18 (12/10 0747) BP: (120-165)/(48-64) 135/48 (12/10 0747) SpO2:  [91 %-98 %] 94 % (12/10 0747)  Weight change:  Filed Weights   11/26/23 1226 11/28/23 0904 11/28/23 1220  Weight: 113.4 kg 111.4 kg 111 kg    Intake/Output: I/O last 3 completed shifts: In: 250 [P.O.:250] Out: 1400 [Urine:1100; Other:300]   Intake/Output this shift:  No intake/output data recorded.  Physical Exam: General: NAD  Head: Normocephalic, atraumatic. Moist oral mucosal membranes  Eyes: Left eye covered  Lungs:  Clear to auscultation, normal effort  Heart: Regular rate and rhythm  Abdomen:  Soft, nontender  Extremities:  no peripheral edema.  Neurologic: Alert and oriented, moving all four extremities  Skin: No lesions  Access: Left AVF    Basic Metabolic Panel: Recent Labs  Lab 11/26/23 1228 11/28/23 0900  NA 138 138  K 3.7 3.8  CL 97* 104  CO2 24 22  GLUCOSE 297* 164*  BUN 36* 51*  CREATININE 3.52* 4.11*  CALCIUM 9.6 9.4  PHOS  --  4.9*    Liver Function Tests: Recent Labs  Lab 11/28/23 0900  ALBUMIN 3.7   No results for input(s): "LIPASE", "AMYLASE" in the last 168 hours. No results for input(s): "AMMONIA" in the last 168 hours.  CBC: Recent Labs  Lab 11/26/23 1228 11/28/23 0900  WBC 13.1* 9.8  HGB 11.7* 11.4*  HCT 34.8* 34.0*   MCV 91.6 90.7  PLT 268 259    Cardiac Enzymes: No results for input(s): "CKTOTAL", "CKMB", "CKMBINDEX", "TROPONINI" in the last 168 hours.  BNP: Invalid input(s): "POCBNP"  CBG: Recent Labs  Lab 11/28/23 1120 11/28/23 1622 11/28/23 2218 11/29/23 0749 11/29/23 1146  GLUCAP 110* 238* 223* 169* 141*    Microbiology: Results for orders placed or performed during the hospital encounter of 04/17/21  SARS CORONAVIRUS 2 (TAT 6-24 HRS) Nasopharyngeal Nasopharyngeal Swab     Status: None   Collection Time: 04/17/21 10:12 AM   Specimen: Nasopharyngeal Swab  Result Value Ref Range Status   SARS Coronavirus 2 NEGATIVE NEGATIVE Final    Comment: (NOTE) SARS-CoV-2 target nucleic acids are NOT DETECTED.  The SARS-CoV-2 RNA is generally detectable in upper and lower respiratory specimens during the acute phase of infection. Negative results do not preclude SARS-CoV-2 infection, do not rule out co-infections with other pathogens, and should not be used as the sole basis for treatment or other patient management decisions. Negative results must be combined with clinical observations, patient history, and epidemiological information. The expected result is Negative.  Fact Sheet for Patients: HairSlick.no  Fact Sheet for Healthcare Providers: quierodirigir.com  This test is not yet approved or cleared by the [place] FDA and  has been authorized for detection and/or diagnosis of SARS-CoV-2 by FDA under an Emergency Use Authorization (EUA). This EUA will remain  in effect (meaning this test  can be used) for the duration of the COVID-19 declaration under Se ction 564(b)(1) of the Act, 21 U.S.C. section 360bbb-3(b)(1), unless the authorization is terminated or revoked sooner.  Performed at [facility], [address] [place], [place] [address]     Coagulation Studies: No results for input(s): "LABPROT", "INR" in  the last 72 hours.   Urinalysis: No results for input(s): "COLORURINE", "LABSPEC", "PHURINE", "GLUCOSEU", "HGBUR", "BILIRUBINUR", "KETONESUR", "PROTEINUR", "UROBILINOGEN", "NITRITE", "LEUKOCYTESUR" in the last 72 hours.  Invalid input(s): "APPERANCEUR"    Imaging: No results found.   Medications:       stroke: early stages of recovery book   Does not apply Once   (feeding supplement) PROSource Plus  30 mL Oral BID BM   allopurinol  300 mg Oral q AM   amLODipine  5 mg Oral QHS   aspirin  81 mg Oral Daily   atorvastatin  80 mg Oral Daily   clopidogrel  75 mg Oral Daily   furosemide  40 mg Oral Q T,Th,S,Su   heparin  5,000 Units Subcutaneous Q8H   hydrALAZINE  50 mg Oral BID   insulin aspart  20 Units Subcutaneous TID WC   insulin glargine-yfgn  40 Units Subcutaneous QHS   losartan  100 mg Oral Daily   metoprolol tartrate  50 mg Oral BID   multivitamin  1 tablet Oral QHS   ondansetron (ZOFRAN) IV  4 mg Intravenous Once   sevelamer carbonate  800 mg Oral TID AC   acetaminophen **OR** acetaminophen (TYLENOL) oral liquid 160 mg/5 mL **OR** acetaminophen, meclizine, mouth rinse  Assessment/ Plan:  Ms. TALULLAH TORRY is a 62 y.o.  female with end stage renal disease on hemodialysis, hypertension, hyperlipidemia, diabetes mellitus type II, coronary artery disease, CVA, diabetes neuropathy, diabetes retinopathy, sleep apnea who is admitted to [facility] on 11/26/2023 for Morbid obesity (HCC) [E66.01] Dizziness [R42] Acute ischemic stroke (HCC) [I63.9] Type 2 diabetes mellitus with hyperglycemia, with long-term current use of insulin (HCC) [E11.65, Z79.4]  [facility] MWF [facility] left AVF 115kg  End Stage Renal Disease:  -UF turned off early during treatment yesterday at patient request the feeling unwell.  Achieved UF of 300 mL. - Next treatment scheduled for Wednesday.  Hypertension: with acute CVA.  - Allow for permissive hypertension.  - Home regimen of amlodipine, furosemide,  losartan, and metoprolol.  -Neurology to follow-up outpatient. -Meclizine prescribed for dizziness, monitor for hypertension.  Anemia with chronic kidney disease: hemoglobin 11. 4, at goal.  Will monitor  Secondary Hyperparathyroidism -Will continue to monitor bone minerals during this admission.   LOS: 1 Kalan Yeley 12/10/20241:38 PM

## 2023-11-29 NOTE — Plan of Care (Addendum)
Patient remains on AR-2A at time of writing. Patient is AA+Ox4 with NIHSS = 4 per my assessment. Tentative upcoming plan of care includes CPAP tonight and HD tomorrow. Patient has been appropriately scored as a "high risk for falls; however, inadequate fall prevention measures were in place upon change of shift. Fall prevention bundle implemented by this RN overnight including the addition of a bed alarm and a yellow "falls risk" arm band. Upon change of shift, neither of the two peripheral IV's that were present were functioning - both were leaking. So, both of those prior PIV's were removed by this RN. A consult was placed by this RN to the IV Team for US-guided PIV placement as no veins were identified by myself as being appropriate to attempt for PIV placement. It was also noted that, although the patient has orders for q shift NIHSS assessment, no NIHSS assessment cards were present at the patient's bedside, nor were any printed packets available at the 2A nurse's station. A NIHSS packet was obtained from another unit by this RN and was placed at the patient's bedside to be used for my shift and future shifts. Also, although the patient meets criteria for MRSA PCR screening (HD patient), no screening was completed upon admission. MRSA PCR screening ordered overnight, and specimen obtained, by this RN per protocol/BPA. CHG baths ordered overnight by this RN as well (indicated due to HD status).    Problem: Education: Goal: Knowledge of disease or condition will improve Outcome: Progressing Goal: Knowledge of secondary prevention will improve (MUST DOCUMENT ALL) Outcome: Progressing Goal: Knowledge of patient specific risk factors will improve Loraine Leriche N/A or DELETE if not current risk factor) Outcome: Progressing   Problem: Ischemic Stroke/TIA Tissue Perfusion: Goal: Complications of ischemic stroke/TIA will be minimized Outcome: Progressing   Problem: Coping: Goal: Will verbalize positive feelings  about self Outcome: Progressing Goal: Will identify appropriate support needs Outcome: Progressing   Problem: Health Behavior/Discharge Planning: Goal: Ability to manage health-related needs will improve Outcome: Progressing Goal: Goals will be collaboratively established with patient/family Outcome: Progressing   Problem: Self-Care: Goal: Ability to participate in self-care as condition permits will improve Outcome: Progressing Goal: Verbalization of feelings and concerns over difficulty with self-care will improve Outcome: Progressing Goal: Ability to communicate needs accurately will improve Outcome: Progressing   Problem: Nutrition: Goal: Risk of aspiration will decrease Outcome: Progressing Goal: Dietary intake will improve Outcome: Progressing   Problem: Education: Goal: Knowledge of General Education information will improve Description: Including pain rating scale, medication(s)/side effects and non-pharmacologic comfort measures Outcome: Progressing   Problem: Health Behavior/Discharge Planning: Goal: Ability to manage health-related needs will improve Outcome: Progressing   Problem: Clinical Measurements: Goal: Ability to maintain clinical measurements within normal limits will improve Outcome: Progressing Goal: Will remain free from infection Outcome: Progressing Goal: Diagnostic test results will improve Outcome: Progressing Goal: Respiratory complications will improve Outcome: Progressing Goal: Cardiovascular complication will be avoided Outcome: Progressing   Problem: Activity: Goal: Risk for activity intolerance will decrease Outcome: Progressing   Problem: Nutrition: Goal: Adequate nutrition will be maintained Outcome: Progressing   Problem: Coping: Goal: Level of anxiety will decrease Outcome: Progressing   Problem: Elimination: Goal: Will not experience complications related to bowel motility Outcome: Progressing Goal: Will not experience  complications related to urinary retention Outcome: Progressing   Problem: Pain Management: Goal: General experience of comfort will improve Outcome: Progressing   Problem: Safety: Goal: Ability to remain free from injury will improve Outcome: Progressing   Problem:  Skin Integrity: Goal: Risk for impaired skin integrity will decrease Outcome: Progressing   Problem: Education: Goal: Knowledge of disease and its progression will improve Outcome: Progressing Goal: Individualized Educational Video(s) Outcome: Progressing   Problem: Fluid Volume: Goal: Compliance with measures to maintain balanced fluid volume will improve Outcome: Progressing   Problem: Health Behavior/Discharge Planning: Goal: Ability to manage health-related needs will improve Outcome: Progressing   Problem: Nutritional: Goal: Ability to make healthy dietary choices will improve Outcome: Progressing   Problem: Clinical Measurements: Goal: Complications related to the disease process, condition or treatment will be avoided or minimized Outcome: Progressing

## 2023-11-29 NOTE — Evaluation (Signed)
Speech Language Pathology Evaluation Patient Details Name: Sharon Arias MRN: [id number] DOB: [birth year] Today's Date: 11/29/2023 Time: 6433-2951 SLP Time Calculation (min) (ACUTE ONLY): 26 min  Problem List:  Patient Active Problem List   Diagnosis Date Noted   Dizziness 11/26/2023   Peritoneal dialysis catheter, fitting and adjustment (HCC) 06/03/2021   Complication from renal dialysis device 04/15/2021   ESRD (end stage renal disease) on dialysis (HCC) 03/02/2021   Stage 5 chronic kidney disease (HCC) 01/27/2021   Aortic atherosclerosis (HCC) 01/12/2021   Diabetic neuropathy (HCC) 01/12/2021   Multiple thyroid nodules 01/12/2021   Diastolic congestive heart failure (HCC) 05/28/2020   Enterococcus faecalis infection 05/14/2020   Non-traumatic rhabdomyolysis 05/14/2020   Acute on chronic diastolic CHF (congestive heart failure) (HCC) 05/14/2020   Bradycardia 05/14/2020   Diabetes mellitus type 2, uncontrolled, with complications 05/14/2020   Morbid obesity with BMI of 45.0-49.9, adult (HCC) 05/14/2020   Rhabdomyolysis 05/14/2020   UTI (urinary tract infection) 05/12/2020   AKI (acute kidney injury) (HCC) 03/21/2020   Right pontine stroke (HCC) 03/18/2020   Gout 03/18/2020   Leukocytosis 03/18/2020   Left sided numbness 03/18/2020   Acute CHF (congestive heart failure) (HCC) 03/18/2020   Lymphedema of both lower extremities 02/13/2020   Benign hypertensive kidney disease with chronic kidney disease 12/18/2019   Proteinuria 12/18/2019   Secondary hyperparathyroidism of renal origin (HCC) 12/18/2019   Uncontrolled type 2 diabetes mellitus with hyperglycemia (HCC) 09/19/2019   History of stroke 08/30/2019   Acute CVA (cerebrovascular accident) (HCC) 07/21/2019   OSA on CPAP 12/29/2018   Necrotizing soft tissue infection 12/11/2018   CAP (community acquired pneumonia) 03/18/2018   Hypocalcemia 03/18/2018   Hypokalemia 03/18/2018   High risk medication use 06/20/2017    Pseudophakia of left eye 06/06/2017   Heart murmur, systolic 04/26/2017   Abnormal stress test 01/19/2017   Chest heaviness 01/19/2017   Chronic arthritis 09/29/2016   Anemia of chronic kidney failure, stage 4 (severe) (HCC) 05/19/2016   DDD (degenerative disc disease), lumbar 01/13/2016   Lumbar radiculitis 01/13/2016   Lumbar stenosis with neurogenic claudication 01/13/2016   Type 2 diabetes mellitus with renal complication (HCC) 10/01/2015   Epiretinal membrane (ERM) of left eye 05/01/2015   Proliferative diabetic retinopathy with macular edema associated with type 2 diabetes mellitus (HCC) 11/13/2014   Plantar fascial fibromatosis 09/02/2014   Diabetic macular edema of both eyes (HCC) 05/07/2013   Iris neovascularization 05/07/2013   Onychomycosis 02/22/2013   Pain in foot 02/22/2013   Peripheral neuropathy 02/22/2013   Wound infection after surgery 08/20/2012   Coronary artery disease 08/17/2012   Myocardial infarction (HCC) 08/16/2012   Hyperlipidemia, unspecified 08/16/2012   HTN (hypertension) 08/16/2012   Postmenopausal 08/16/2012   Wears glasses 08/16/2012   Past Medical History:  Past Medical History:  Diagnosis Date   Anemia in chronic kidney disease 05/19/2016   Anxiety    Aortic atherosclerosis (HCC)    Blind    CAD (coronary artery disease)    Cardiac murmur    CHF (congestive heart failure) (HCC)    G2DD on 02/2020 TTE   Chicken pox    Chronic anticoagulation    CVA (cerebral vascular accident) (HCC)    DDD (degenerative disc disease), lumbar    Detached retina    Diabetic neuropathy (HCC)    Diabetic retinopathy (HCC)    legally blind   Dyspnea    ESRD needing dialysis (HCC)    Hx of CABG    Hyperlipidemia  Hypertension    NSTEMI (non-ST elevated myocardial infarction) (HCC) 06/2012   Obesity    Pneumonia    PONV (postoperative nausea and vomiting)    Sciatica of right side    Sleep apnea    CPAP NIGHTLY   T2DM (type 2 diabetes mellitus)  [facility] [address])    Past Surgical History:  Past Surgical History:  Procedure Laterality Date   A/V FISTULAGRAM Left 04/21/2021   Procedure: A/V FISTULAGRAM;  Surgeon: Renford Dills, MD;  Location: [facility] INVASIVE CV LAB;  Service: Cardiovascular;  Laterality: Left;   A/V FISTULAGRAM Left 11/02/2022   Procedure: A/V Fistulagram;  Surgeon: Renford Dills, MD;  Location: [facility] INVASIVE CV LAB;  Service: Cardiovascular;  Laterality: Left;   AV FISTULA PLACEMENT Left 02/06/2021   Procedure: ARTERIOVENOUS (AV) FISTULA CREATION (BRACHIAL CEPHALIC );  Surgeon: Renford Dills, MD;  Location: [facility] ORS;  Service: Vascular;  Laterality: Left;   COLONOSCOPY WITH PROPOFOL N/A 01/26/2016   Procedure: COLONOSCOPY WITH PROPOFOL;  Surgeon: Christena Deem, MD;  Location: [facility] ENDOSCOPY;  Service: Endoscopy;  Laterality: N/A;   COLONOSCOPY WITH PROPOFOL N/A 01/27/2016   Procedure: COLONOSCOPY WITH PROPOFOL;  Surgeon: Christena Deem, MD;  Location[facility] ENDOSCOPY;  Service: Endoscopy;  Laterality: N/A;   CORONARY ARTERY BYPASS GRAFT  2013   DIALYSIS/PERMA CATHETER INSERTION N/A 02/03/2021   Procedure: DIALYSIS/PERMA CATHETER INSERTION;  Surgeon: Renford Dills, MD;  Location: [facility] INVASIVE CV LAB;  Service: Cardiovascular;  Laterality: N/A;   DIALYSIS/PERMA CATHETER INSERTION N/A 06/16/2021   Procedure: DIALYSIS/PERMA CATHETER INSERTION;  Surgeon: Annice Needy, MD;  Location: [facility] INVASIVE CV LAB;  Service: Cardiovascular;  Laterality: N/A;   DIALYSIS/PERMA CATHETER REMOVAL N/A 09/22/2021   Procedure: DIALYSIS/PERMA CATHETER REMOVAL;  Surgeon: Renford Dills, MD;  Location: [facility] INVASIVE CV LAB;  Service: Cardiovascular;  Laterality: N/A;   INCISION AND DRAINAGE     chest abscess   INCISION AND DRAINAGE ABSCESS N/A 12/11/2018   Procedure: INCISION AND DRAINAGE PERINEAL;  Surgeon: Leafy Ro, MD;  Location: [facility] ORS;  Service: General;  Laterality: N/A;   RETINAL LASER PROCEDURE     HPI:  62yo female  admitted 11/26/23 with dizziness. PMH: CVA, HTN, HLD, CHF, ESRD on HD, DM, chronic blindness, NSTEMI, anxiety, CAD s/pp CABG, DDD, OSA/CPAP. MRI = acute infarct left brachium pontis, severe narrowing of the ophthalmic segment of the right ICA   Assessment / Plan / Recommendation Clinical Impression  Pt seen for cognitive linguistic evaluation. Pt awake and alert, pleasant and cooperative. Pt reports 2 years of college, independent prior to admit aside from driving (due to low vision). Pt presents with fully intelligble speech but mild dysarthria related to left side facial weakness and decreased sensation. Lingual strength appears WFL. Receptive and Expressive Language are intact.   The [employer] Mental Status (SLUMS) was administered. Pt WFL, scoring 30/30 points. SLP provided education on strategies to maximize clarity of speech and exercises to strengthen labial strength and lip seal. Pt was receptive and appreciative of information. She would benefit from short term skilled speech therapy at next level of care to continue intervention for mild dysarthria. Acute ST signing off at this time. Please reconsult if needs arise.    SLP Assessment  SLP Recommendation/Assessment: All further Speech Language Pathology needs can be addressed in the next venue of care  SLP Visit Diagnosis: Dysarthria and anarthria (R47.1)    Recommendations for follow up therapy are one component of a multi-disciplinary discharge planning  process, led by the attending physician.  Recommendations may be updated based on patient status, additional functional criteria and insurance authorization.    Follow Up Recommendations  Follow physician's recommendations for discharge plan and follow up therapies    Assistance Recommended at Discharge  Intermittent Supervision/Assistance  Functional Status Assessment Patient has had a recent decline in their functional status and demonstrates the ability to make significant  improvements in function in a reasonable and predictable amount of time.     SLP Evaluation Cognition  Overall Cognitive Status: Within Functional Limits for tasks assessed Arousal/Alertness: Awake/alert Orientation Level: Oriented X4 Memory: Appears intact Awareness: Appears intact Problem Solving: Appears intact       Comprehension  Auditory Comprehension Overall Auditory Comprehension: Appears within functional limits for tasks assessed Reading Comprehension Reading Status: Impaired Interfering Components: Visual acuity    Expression Expression Primary Mode of Expression: Verbal Verbal Expression Overall Verbal Expression: Appears within functional limits for tasks assessed Written Expression Dominant Hand: Right Written Expression: Exceptions to WFL (low vision)   Oral / Motor  Oral Motor/Sensory Function Overall Oral Motor/Sensory Function: Mild impairment Facial ROM: Reduced left Facial Symmetry: Abnormal symmetry left Facial Strength: Reduced left Facial Sensation: Reduced left Lingual ROM: Within Functional Limits Lingual Symmetry: Within Functional Limits Lingual Strength: Within Functional Limits Lingual Sensation: Within Functional Limits Mandible: Within Functional Limits Motor Speech Overall Motor Speech: Impaired Respiration: Within functional limits Phonation: Normal Resonance: Within functional limits Articulation: Impaired Level of Impairment: Conversation Intelligibility: Intelligible Motor Planning: Witnin functional limits Motor Speech Errors: Not applicable Effective Techniques: Slow rate;Over-articulate           Almira Phetteplace B. Murvin Natal, [facility], CCC-SLP Speech Language Pathologist  [place] [place] 11/29/2023, 10:09 AM

## 2023-11-29 NOTE — Progress Notes (Signed)
Informed NP Jawo about 2.5 second pause. Patient is asymptomatic. No new orders

## 2023-11-29 NOTE — Plan of Care (Signed)
  Problem: Education: Goal: Knowledge of disease or condition will improve Outcome: Progressing   Problem: Self-Care: Goal: Verbalization of feelings and concerns over difficulty with self-care will improve Outcome: Progressing   Problem: Nutrition: Goal: Risk of aspiration will decrease Outcome: Progressing

## 2023-11-30 DIAGNOSIS — Z992 Dependence on renal dialysis: Secondary | ICD-10-CM

## 2023-11-30 DIAGNOSIS — N184 Chronic kidney disease, stage 4 (severe): Secondary | ICD-10-CM | POA: Diagnosis not present

## 2023-11-30 DIAGNOSIS — I10 Essential (primary) hypertension: Secondary | ICD-10-CM

## 2023-11-30 DIAGNOSIS — E1121 Type 2 diabetes mellitus with diabetic nephropathy: Secondary | ICD-10-CM

## 2023-11-30 DIAGNOSIS — R42 Dizziness and giddiness: Secondary | ICD-10-CM | POA: Diagnosis not present

## 2023-11-30 DIAGNOSIS — I2510 Atherosclerotic heart disease of native coronary artery without angina pectoris: Secondary | ICD-10-CM

## 2023-11-30 DIAGNOSIS — I639 Cerebral infarction, unspecified: Secondary | ICD-10-CM | POA: Diagnosis not present

## 2023-11-30 DIAGNOSIS — N186 End stage renal disease: Secondary | ICD-10-CM

## 2023-11-30 DIAGNOSIS — D631 Anemia in chronic kidney disease: Secondary | ICD-10-CM

## 2023-11-30 LAB — RENAL FUNCTION PANEL
Albumin: 3.6 g/dL (ref 3.5–5.0)
Anion gap: 13 (ref 5–15)
BUN: 62 mg/dL — ABNORMAL HIGH (ref 8–23)
CO2: 21 mmol/L — ABNORMAL LOW (ref 22–32)
Calcium: 9.3 mg/dL (ref 8.9–10.3)
Chloride: 104 mmol/L (ref 98–111)
Creatinine, Ser: 4.63 mg/dL — ABNORMAL HIGH (ref 0.44–1.00)
GFR, Estimated: 10 mL/min — ABNORMAL LOW (ref >60)
Glucose, Bld: 157 mg/dL — ABNORMAL HIGH (ref 70–99)
Phosphorus: 5.9 mg/dL — ABNORMAL HIGH (ref 2.5–4.6)
Potassium: 4 mmol/L (ref 3.5–5.1)
Sodium: 138 mmol/L (ref 135–145)

## 2023-11-30 LAB — CBC
HCT: 34 % — ABNORMAL LOW (ref 36.0–46.0)
Hemoglobin: 11.6 g/dL — ABNORMAL LOW (ref 12.0–15.0)
MCH: 30.4 pg (ref 26.0–34.0)
MCHC: 34.1 g/dL (ref 30.0–36.0)
MCV: 89.2 fL (ref 80.0–100.0)
Platelets: 247 10*3/uL (ref 150–400)
RBC: 3.81 MIL/uL — ABNORMAL LOW (ref 3.87–5.11)
RDW: 14 % (ref 11.5–15.5)
WBC: 11.2 10*3/uL — ABNORMAL HIGH (ref 4.0–10.5)
nRBC: 0 % (ref 0.0–0.2)

## 2023-11-30 LAB — GLUCOSE, CAPILLARY
Glucose-Capillary: 142 mg/dL — ABNORMAL HIGH (ref 70–99)
Glucose-Capillary: 263 mg/dL — ABNORMAL HIGH (ref 70–99)
Glucose-Capillary: 356 mg/dL — ABNORMAL HIGH (ref 70–99)

## 2023-11-30 MED ORDER — PENTAFLUOROPROP-TETRAFLUOROETH EX AERO
INHALATION_SPRAY | CUTANEOUS | Status: AC
  Filled 2023-11-30: qty 30

## 2023-11-30 NOTE — Progress Notes (Addendum)
Pt off unit for dialysis, report given to [facility] LPN.

## 2023-11-30 NOTE — Progress Notes (Signed)
Inpatient Rehab Admissions Coordinator:   Awaiting insurance determination from [facility] Medicare regarding CIR prior auth request.   Estill Dooms, PT, DPT Admissions Coordinator [phone] 11/30/23  9:46 AM

## 2023-11-30 NOTE — Progress Notes (Signed)
OT Cancellation Note  Patient Details Name: Sharon Arias MRN: [id number] DOB: Dec 12, 1961   Cancelled Treatment:    Reason Eval/Treat Not Completed: Patient at procedure or test/ unavailable. Will attempt as appropriate.  Butch Penny, SOT

## 2023-11-30 NOTE — Progress Notes (Signed)
Per Marcelino Duster MD okay to discontinue telemetry

## 2023-11-30 NOTE — PMR Pre-admission (Signed)
PMR Admission Coordinator Pre-Admission Assessment  Patient: Sharon Arias is an 62 y.o., female MRN: [id number] DOB: [birth year] Height: 5\' 7"  (170.2 cm) Weight: 110.8 kg  Insurance Information HMO: yes    PPO:      PCP:      IPA:      80/20:      OTHER:  PRIMARY: U[employer] Medicare      Policy#: [id number]      Subscriber: pt CM Name: Marilynne Halsted      Phone#: [phone]     Fax#: [phone] Pre-Cert#: [id number] auth for CIR provided by Marilynne Halsted with [facility] Medicare with updates to fax listed above on 12/18      Employer:  Benefits:  Phone #: [phone]     Name:  Eff. Date: 12/20/22     Deduct: $0      Out of Pocket Max: $3600 (met)      Life Max:  CIR: $295/day for days 1-5      SNF: 20 full days Outpatient:      Co-Pay: $20/visit Home Health: 100%      Co-Pay:  DME: 80%     Co-Pay: 20% Providers:  SECONDARY:       Policy#:      Phone#:   Artist:       Phone#:   The Engineer, materials Information Summary" for patients in Inpatient Rehabilitation Facilities with attached "Privacy Act Statement-Health Care Records" was provided and verbally reviewed with: Patient and Family  Emergency Contact Information Contact Information     Name Relation Home Work Mobile   driver,lindsay Daughter [phone]  [phone]      Other Contacts   None on File     Current Medical History  Patient Admitting Diagnosis: L pontine CVA   History of Present Illness: Pt is a 62 y/o female with PMH of ESRD on HD MWF, HTN, HLD, DM with neuropathy and retinopathy, OSA, and CAD, CVA admitted to [facility] on 12/7 with c/o dizziness and vomiting.  She took meclizine at home with no effect.  In ED BP 175/59, WBC 13.1, hgb 11.7, creatinine 3.52, anion gap 17, cbg 374.  NIHSS 5.  MRI/MRA of the head showed acute infarct in the brachium pontis on the left. Severe narrowing in the ophthalmic segment of the right ICA. Echocardiogram with ejection fraction of 55 to 60% no wall motion abnormalities. Neurology  follow-up patient currently remains on low-dose aspirin and Plavix for CVA prophylaxis. Subcutaneous heparin added for DVT prophylaxis. Tolerating a regular consistency diet. Therapy evaluations completed and pt was recommended for a comprehensive rehab program.   Complete NIHSS TOTAL: 2  Patient's medical record from [facility] has been reviewed by the rehabilitation admission coordinator and physician.  Past Medical History  Past Medical History:  Diagnosis Date   Anemia in chronic kidney disease 05/19/2016   Anxiety    Aortic atherosclerosis (HCC)    Blind    CAD (coronary artery disease)    Cardiac murmur    CHF (congestive heart failure) (HCC)    G2DD on 02/2020 TTE   Chicken pox    Chronic anticoagulation    CVA (cerebral vascular accident) (HCC)    DDD (degenerative disc disease), lumbar    Detached retina    Diabetic neuropathy (HCC)    Diabetic retinopathy (HCC)    legally blind   Dyspnea    ESRD needing dialysis (HCC)    Hx of CABG    Hyperlipidemia    Hypertension  NSTEMI (non-ST elevated myocardial infarction) (HCC) 06/2012   Obesity    Pneumonia    PONV (postoperative nausea and vomiting)    Sciatica of right side    Sleep apnea    CPAP NIGHTLY   T2DM (type 2 diabetes mellitus) (HCC)     Has the patient had major surgery during 100 days prior to admission? No  Family History   family history includes Breast cancer in her mother; Prostate cancer in her father.  Current Medications  Current Facility-Administered Medications:     stroke: early stages of recovery book, , Does not apply, Once, Lurene Shadow, MD   (feeding supplement) PROSource Plus liquid 30 mL, 30 mL, Oral, BID BM, Lurene Shadow, MD, 30 mL at 11/29/23 1410   acetaminophen (TYLENOL) tablet 650 mg, 650 mg, Oral, Q4H PRN **OR** acetaminophen (TYLENOL) 160 MG/5ML solution 650 mg, 650 mg, Per Tube, Q4H PRN **OR** acetaminophen (TYLENOL) suppository 650 mg, 650 mg, Rectal, Q4H PRN, Lurene Shadow,  MD   allopurinol (ZYLOPRIM) tablet 300 mg, 300 mg, Oral, q AM, Lurene Shadow, MD, 300 mg at 12/02/23 0611   amLODipine (NORVASC) tablet 5 mg, 5 mg, Oral, QHS, Lurene Shadow, MD, 5 mg at 12/01/23 2141   aspirin chewable tablet 81 mg, 81 mg, Oral, Daily, Lurene Shadow, MD, 81 mg at 12/01/23 0918   atorvastatin (LIPITOR) tablet 80 mg, 80 mg, Oral, Daily, Lurene Shadow, MD, 80 mg at 12/01/23 1610   Chlorhexidine Gluconate Cloth 2 % PADS 6 each, 6 each, Topical, Q0600, Lurene Shadow, MD, 6 each at 12/02/23 [phone]   clopidogrel (PLAVIX) tablet 75 mg, 75 mg, Oral, Daily, Lurene Shadow, MD, 75 mg at 12/01/23 5409   furosemide (LASIX) tablet 40 mg, 40 mg, Oral, Q T,Th,S,Su, Lurene Shadow, MD, 40 mg at 11/29/23 0813   heparin injection 1,000 Units, 1,000 Units, Intracatheter, PRN, Wendee Beavers, NP   heparin injection 5,000 Units, 5,000 Units, Subcutaneous, Q8H, Lurene Shadow, MD, 5,000 Units at 12/02/23 8119   hydrALAZINE (APRESOLINE) tablet 50 mg, 50 mg, Oral, BID, Lurene Shadow, MD, 50 mg at 12/01/23 2142   insulin aspart (novoLOG) injection 0-5 Units, 0-5 Units, Subcutaneous, QHS, Sreeram, Narendranath, MD   insulin aspart (novoLOG) injection 0-9 Units, 0-9 Units, Subcutaneous, TID WC, Sreeram, Narendranath, MD, 1 Units at 12/01/23 1703   insulin aspart (novoLOG) injection 20 Units, 20 Units, Subcutaneous, TID WC, Lurene Shadow, MD, 20 Units at 12/01/23 1703   insulin glargine-yfgn (SEMGLEE) injection 60 Units, 60 Units, Subcutaneous, QHS, Sreeram, Narendranath, MD, 60 Units at 12/01/23 2140   lidocaine-prilocaine (EMLA) cream 1 Application, 1 Application, Topical, PRN, Wendee Beavers, NP   losartan (COZAAR) tablet 100 mg, 100 mg, Oral, Daily, Lurene Shadow, MD, 100 mg at 12/01/23 0919   meclizine (ANTIVERT) tablet 25 mg, 25 mg, Oral, TID PRN, Lurene Shadow, MD, 25 mg at 11/29/23 0813   metoprolol tartrate (LOPRESSOR) tablet 50 mg, 50 mg, Oral, BID, Lurene Shadow, MD, 50 mg at 12/01/23  2141   multivitamin (RENA-VIT) tablet 1 tablet, 1 tablet, Oral, QHS, Lurene Shadow, MD, 1 tablet at 12/01/23 2141   ondansetron (ZOFRAN) injection 4 mg, 4 mg, Intravenous, Once, Lurene Shadow, MD   Oral care mouth rinse, 15 mL, Mouth Rinse, PRN, Lurene Shadow, MD   pentafluoroprop-tetrafluoroeth (GEBAUERS) aerosol 1 Application, 1 Application, Topical, PRN, Wendee Beavers, NP   sevelamer carbonate (RENVELA) tablet 800 mg, 800 mg, Oral, TID AC, Breeze, Shantelle, NP, 800 mg at 11/30/23 1724  Patients Current Diet:  Diet Order  Diet Carb Modified Fluid consistency: Thin  Diet effective now                   Precautions / Restrictions Precautions Precautions: Fall Precaution Comments: baseline vision issues, spinning sensation with mobility Restrictions Weight Bearing Restrictions Per Provider Order: No   Has the patient had 2 or more falls or a fall with injury in the past year? Yes  Prior Activity Level Limited Community (1-2x/wk): mod I at baseline, does not drive 2/2 legally blind, has groceries delivered and uses [facility] for transport to appointments  Prior Functional Level Self Care: Did the patient need help bathing, dressing, using the toilet or eating? Independent  Indoor Mobility: Did the patient need assistance with walking from room to room (with or without device)? Independent  Stairs: Did the patient need assistance with internal or external stairs (with or without device)? Independent  Functional Cognition: Did the patient need help planning regular tasks such as shopping or remembering to take medications? Independent  Patient Information Are you of Hispanic, Latino/a,or Spanish origin?: A. No, not of Hispanic, Latino/a, or Spanish origin What is your race?: A. White Do you need or want an interpreter to communicate with a doctor or health care staff?: 0. No  Patient's Response To:  Health Literacy and Transportation Is the patient able to  respond to health literacy and transportation needs?: Yes Health Literacy - How often do you need to have someone help you when you read instructions, pamphlets, or other written material from your doctor or pharmacy?: Never In the past 12 months, has lack of transportation kept you from medical appointments or from getting medications?: No In the past 12 months, has lack of transportation kept you from meetings, work, or from getting things needed for daily living?: No  Home Assistive Devices / Equipment Home Equipment: Agricultural consultant (2 wheels), [employer] - single point, Wheelchair - manual, Information systems manager, Grab bars - tub/shower  Prior Device Use: Indicate devices/aids used by the patient prior to current illness, exacerbation or injury? None of the above  Current Functional Level Cognition  Arousal/Alertness: Awake/alert Overall Cognitive Status: Within Functional Limits for tasks assessed Orientation Level: Oriented X4 General Comments: patient is able to follow single step commands consistently Memory: Appears intact Awareness: Appears intact Problem Solving: Appears intact    Extremity Assessment (includes Sensation/Coordination)  Upper Extremity Assessment: LUE deficits/detail LUE Deficits / Details: Noted pt's LUE fine and gross coordination to be slow  Lower Extremity Assessment: Defer to PT evaluation RLE Deficits / Details: 5/5 with MMT, dorsiflexion, plantarflexion, hip add/abd, knee extension. however patient feels generalized weakness in the legs with standing RLE Sensation: history of peripheral neuropathy LLE Deficits / Details: 5/5 with MMT, dorsiflexion, plantarflexion, hip add/abd, knee extension. however patient feels generalized weakness in the legs with standing LLE Sensation: history of peripheral neuropathy LLE Coordination: decreased gross motor    ADLs  Overall ADL's : Needs assistance/impaired Functional mobility during ADLs: Contact guard assist, Minimal  assistance, +2 for physical assistance, Moderate assistance General ADL Comments: Pt requested brushing teeth and completed grooming tasks (oral care and washing face) sitting at the EOB with supervision. Pt completed fine motor control task picking washers up from table top and placing on dowel, noted patients L hand requiring increased time. Pt completed fine motor control task picking washers up from table top and storing in palm, noted L hand requiring increased time.    Mobility  Overal bed mobility: Needs Assistance Bed Mobility:  Supine to Sit Supine to sit: Contact guard, HOB elevated Sit to supine: Contact guard assist, Used rails General bed mobility comments: increased time required with increased dizziness reported with mobility    Transfers  Overall transfer level: Needs assistance Equipment used: Rolling walker (2 wheels) Transfers: Sit to/from Stand Sit to Stand: Min assist General transfer comment: lifting assistance required for standing from bed and from toilet. patient required cues for safety and positioning with sitting on toilet due to impaired vision and spatial awareness    Ambulation / Gait / Stairs / Wheelchair Mobility  Ambulation/Gait Ambulation/Gait assistance: Editor, commissioning (Feet): 30 Feet Assistive device: Rolling walker (2 wheels) Gait Pattern/deviations: Staggering right, Decreased stride length, Step-through pattern General Gait Details: steadying assistance required for ambulation. mild staggering to the right with cues for correction. patient intermittently closed right eye with improved spinning sensation. education provided on strategies to help with dizziness Gait velocity: decreased Pre-gait activities: weight shifting faciliation provided to right/left with sensation of falling. patient is able to advance the right leg forward without significant loss of balance. decreased coordination with L foot placement when taking a step with loss of  balance. patient is able to take side steps to the right. Mod A +2 for dynamic pre-gait activity.    Posture / Balance Balance Overall balance assessment: Needs assistance Sitting-balance support: Feet supported Sitting balance-Leahy Scale: Fair Postural control: Posterior lean Standing balance support: Bilateral upper extremity supported Standing balance-Leahy Scale: Poor Standing balance comment: external support required from rolling walker    Special needs/care consideration CPAP, Diabetic management yes, and Special service needs visually impaired   Previous Home Environment (from acute therapy documentation) Living Arrangements: Alone  Lives With: Alone (husband died in 2023-08-10) Available Help at Discharge: Available PRN/intermittently, Family Type of Home: House Home Layout: One level Home Access: Stairs to enter Entrance Stairs-Rails: Right Entrance Stairs-Number of Steps: 2 Bathroom Shower/Tub: Health visitor: Handicapped height Home Care Services: No Additional Comments: apartment has a ramped entrance  Discharge Living Setting Plans for Discharge Living Setting: Patient's home, Lives with (comment) (daughter and her family to stay with pt at discharge) Type of Home at Discharge: House (also has an attached apartment which is handicap accessible) Discharge Home Layout: One level Discharge Home Access: Ramped entrance Discharge Bathroom Shower/Tub: Walk-in shower Discharge Bathroom Toilet: Handicapped height Discharge Bathroom Accessibility: Yes How Accessible: Accessible via walker, Accessible via wheelchair (in apartment, the main house bathroom is NOT accessible) Does the patient have any problems obtaining your medications?: No  Social/Family/Support Systems Anticipated Caregiver: daughter Areta Haber Anticipated Caregiver's Contact Information: [phone] Ability/Limitations of Caregiver: none stated Caregiver Availability: 24/7 Discharge  Plan Discussed with Primary Caregiver: Yes Is Caregiver In Agreement with Plan?: Yes Does Caregiver/Family have Issues with Lodging/Transportation while Pt is in Rehab?: No  Goals Patient/Family Goal for Rehab: PT/OT/SLP supervision to mod I Expected length of stay: 10-14 days Additional Information: Discharge plan: discharge back to pt's house, her daughter Mardella Layman) can provide 24/7 supervision if needed and is planning to stay with pt Pt/Family Agrees to Admission and willing to participate: Yes Program Orientation Provided & Reviewed with Pt/Caregiver Including Roles  & Responsibilities: Yes  Decrease burden of Care through IP rehab admission: n/a  Possible need for SNF placement upon discharge: No.  Plan for discharge to pt's home and daughter will stay with her.  She can provide 24/7 supervision.   Patient Condition: I have reviewed medical records from [facility], spoken with  TOC team , and patient and daughter. I discussed via phone for inpatient rehabilitation assessment.  Patient will benefit from ongoing PT, OT, and SLP, can actively participate in 3 hours of therapy a day 5 days of the week, and can make measurable gains during the admission.  Patient will also benefit from the coordinated team approach during an Inpatient Acute Rehabilitation admission.  The patient will receive intensive therapy as well as Rehabilitation physician, nursing, social worker, and care management interventions.  Due to safety, skin/wound care, disease management, medication administration, pain management, and patient education the patient requires 24 hour a day rehabilitation nursing.  The patient is currently min assist with mobility and basic ADLs.  Discharge setting and therapy post discharge at home with home health is anticipated.  Patient has agreed to participate in the Acute Inpatient Rehabilitation Program and will admit today.  Preadmission Screen Completed By:  Stephania Fragmin, PT, DPT 12/02/2023  10:08 AM ______________________________________________________________________   Discussed status with Dr. Shearon Stalls on 12/02/23  at 10:08 AM  and received approval for admission today.  Admission Coordinator:  Stephania Fragmin, PT, DPT time 10:08 AM Dorna Bloom 12/02/23    Assessment/Plan: Diagnosis: Does the need for close, 24 hr/day Medical supervision in concert with the patient's rehab needs make it unreasonable for this patient to be served in a less intensive setting? Yes Co-Morbidities requiring supervision/potential complications: Hyperglycemia, ESRD on HD, anemia, diastolic CHF, Hypertension, bradycardia Due to safety, skin/wound care, disease management, medication administration, pain management, and patient education, does the patient require 24 hr/day rehab nursing? Yes Does the patient require coordinated care of a physician, rehab nurse, PT, OT, and SLP to address physical and functional deficits in the context of the above medical diagnosis(es)? Yes Addressing deficits in the following areas: balance, endurance, locomotion, strength, transferring, bowel/bladder control, bathing, dressing, feeding, grooming, toileting, and speech Can the patient actively participate in an intensive therapy program of at least 3 hrs of therapy 5 days a week? Yes The potential for patient to make measurable gains while on inpatient rehab is good Anticipated functional outcomes upon discharge from inpatient rehab: supervision PT, supervision OT, modified independent SLP Estimated rehab length of stay to reach the above functional goals is: 10-14 days Anticipated discharge destination: Home 10. Overall Rehab/Functional Prognosis: good   MD Signature:  Angelina Sheriff, DO 12/02/2023

## 2023-11-30 NOTE — Progress Notes (Signed)
[facility]  ROUNDING NOTE   Subjective:   [name] was admitted to [facility] on 11/26/2023 for Morbid obesity (HCC) [E66.01] Dizziness [R42] Acute ischemic stroke [facility]) [I63.9] Type 2 diabetes mellitus with hyperglycemia, with long-term current use of insulin (HCC) [E11.65, Z79.4]  Update  Patient seen and evaluated during dialysis   HEMODIALYSIS FLOWSHEET:  Blood Flow Rate (mL/min): 399 mL/min Arterial Pressure (mmHg): -197.57 mmHg Venous Pressure (mmHg): 230.49 mmHg TMP (mmHg): -3.63 mmHg Ultrafiltration Rate (mL/min): 246 mL/min Dialysate Flow Rate (mL/min): 299 ml/min  Continues to complain of dizziness Request for no UF during treatment  Objective:  Vital signs in last 24 hours:  Temp:  [97.7 F (36.5 C)-98.7 F (37.1 C)] 98.1 F (36.7 C) (12/11 0758) Pulse Rate:  [57-64] 61 (12/11 1024) Resp:  [14-19] 16 (12/11 1024) BP: (127-170)/(44-61) 148/44 (12/11 1024) SpO2:  [94 %-99 %] 99 % (12/11 1024) FiO2 (%):  [21 %] 21 % (12/10 2340) Weight:  [111.2 kg-114.4 kg] 111.2 kg (12/11 1024)  Weight change: 3 kg Filed Weights   11/28/23 1220 11/30/23 0308 11/30/23 1024  Weight: 111 kg 114.4 kg 111.2 kg    Intake/Output: I/O last 3 completed shifts: In: 250 [P.O.:250] Out: 650 [Urine:650]   Intake/Output this shift:  No intake/output data recorded.  Physical Exam: General: NAD  Head: Normocephalic, atraumatic. Moist oral mucosal membranes  Eyes: Eyes closed  Lungs:  Clear to auscultation, normal effort  Heart: Regular rate and rhythm  Abdomen:  Soft, nontender  Extremities:  no peripheral edema.  Neurologic: Alert and oriented, moving all four extremities  Skin: No lesions  Access: Left AVF    Basic Metabolic Panel: Recent Labs  Lab 11/26/23 1228 11/28/23 0900  NA 138 138  K 3.7 3.8  CL 97* 104  CO2 24 22  GLUCOSE 297* 164*  BUN 36* 51*  CREATININE 3.52* 4.11*  CALCIUM 9.6 9.4  PHOS  --  4.9*    Liver Function  Tests: Recent Labs  Lab 11/28/23 0900  ALBUMIN 3.7   No results for input(s): "LIPASE", "AMYLASE" in the last 168 hours. No results for input(s): "AMMONIA" in the last 168 hours.  CBC: Recent Labs  Lab 11/26/23 1228 11/28/23 0900  WBC 13.1* 9.8  HGB 11.7* 11.4*  HCT 34.8* 34.0*  MCV 91.6 90.7  PLT 268 259    Cardiac Enzymes: No results for input(s): "CKTOTAL", "CKMB", "CKMBINDEX", "TROPONINI" in the last 168 hours.  BNP: Invalid input(s): "POCBNP"  CBG: Recent Labs  Lab 11/28/23 2218 11/29/23 0749 11/29/23 1146 11/29/23 1653 11/30/23 0631  GLUCAP 223* 169* 141* 121* 142*    Microbiology: Results for orders placed or performed during the hospital encounter of 11/26/23  MRSA Next Gen by PCR, Nasal     Status: None   Collection Time: 11/29/23  9:13 PM   Specimen: Nasal Mucosa; Nasal Swab  Result Value Ref Range Status   MRSA by PCR Next Gen NOT DETECTED NOT DETECTED Final    Comment: (NOTE) The GeneXpert MRSA Assay (FDA approved for NASAL specimens only), is one component of a comprehensive MRSA colonization surveillance program. It is not intended to diagnose MRSA infection nor to guide or monitor treatment for MRSA infections. Test performance is not FDA approved in patients less than 1 years old. Performed at [facility], [address] [place], [place] [address]     Coagulation Studies: No results for input(s): "LABPROT", "INR" in the last 72 hours.   Urinalysis: No results  for input(s): "COLORURINE", "LABSPEC", "PHURINE", "GLUCOSEU", "HGBUR", "BILIRUBINUR", "KETONESUR", "PROTEINUR", "UROBILINOGEN", "NITRITE", "LEUKOCYTESUR" in the last 72 hours.  Invalid input(s): "APPERANCEUR"    Imaging: No results found.   Medications:       stroke: early stages of recovery book   Does not apply Once   (feeding supplement) PROSource Plus  30 mL Oral BID BM   allopurinol  300 mg Oral q AM   amLODipine  5 mg Oral QHS   aspirin  81 mg Oral  Daily   atorvastatin  80 mg Oral Daily   Chlorhexidine Gluconate Cloth  6 each Topical Q0600   clopidogrel  75 mg Oral Daily   furosemide  40 mg Oral Q T,Th,S,Su   heparin  5,000 Units Subcutaneous Q8H   hydrALAZINE  50 mg Oral BID   insulin aspart  20 Units Subcutaneous TID WC   insulin glargine-yfgn  40 Units Subcutaneous QHS   losartan  100 mg Oral Daily   metoprolol tartrate  50 mg Oral BID   multivitamin  1 tablet Oral QHS   ondansetron (ZOFRAN) IV  4 mg Intravenous Once   sevelamer carbonate  800 mg Oral TID AC   acetaminophen **OR** acetaminophen (TYLENOL) oral liquid 160 mg/5 mL **OR** acetaminophen, meclizine, mouth rinse  Assessment/ Plan:  [name] is a 62 y.o.  female with end stage renal disease on hemodialysis, hypertension, hyperlipidemia, diabetes mellitus type II, coronary artery disease, CVA, diabetes neuropathy, diabetes retinopathy, sleep apnea who is admitted to [facility] on 11/26/2023 for Morbid obesity (HCC) [E66.01] Dizziness [R42] Acute ischemic stroke (HCC) [I63.9] Type 2 diabetes mellitus with hyperglycemia, with long-term current use of insulin (HCC) [E11.65, Z79.4]  [facility] MWF [facility] left AVF 115kg  End Stage Renal Disease:  - Receiving dialysis today, requesting no UF. Will honor this, euvolemic on exam. Next treatment scheduled for Friday.   Hypertension: with acute CVA.  - Allow for permissive hypertension.  - Home regimen of amlodipine, furosemide, losartan, and metoprolol.  -Neurology to follow-up outpatient. -Meclizine prescribed for dizziness, monitor for hypertension. - Blood pressure 125/43 during dialysis.  Anemia with chronic kidney disease: hemoglobin 11.6.  Will monitor  Secondary Hyperparathyroidism -Hyperphosphatemia noted. Continue sevelamer with meals.    LOS: 2 Sharon Arias 12/11/202410:55 AM

## 2023-11-30 NOTE — Progress Notes (Signed)
  Received patient in bed to unit.   Informed consent signed and in chart.    TX duration:2.18hrs  Pt signed off 42 min early d/t needing to have BM.  Offered bedpan multiple times but pt refused.     Transported back to floor  Hand-off given to patient's nurse.    Access used: LAVF Access issues: none   Total UF removed: 0 Medication(s) given: none Post HD weight: 111.4kg     Lynann Beaver  [facility]

## 2023-11-30 NOTE — Progress Notes (Signed)
Progress Note   Patient: Sharon Arias JXB:[id number] DOB: [birth year] DOA: 11/26/2023     2 DOS: the patient was seen and examined on 11/30/2023   Brief hospital course: Sharon Arias is a 62 y.o. female with medical history significant for history of stroke, CAD s/p CABG and coronary stent on aspirin and Plavix, chronic diastolic CHF, ESRD on hemodialysis, type II DM, diabetic neuropathy, legal blindness, anemia of chronic disease, sleep apnea, obesity.  She presented to the hospital because of nausea, vomiting and dizziness.  She describes the dizziness as spinning sensation, as if the room is spinning.  She felt dizzy on 11/25/2023 around 10 AM.  She did not seek medical attention because she thought she would be okay.  She went for hemodialysis that same day.  However, when she got home, her symptoms had worsened.  She had nausea and multiple episodes of vomiting.  She presented to the hospital the following day because of worsening symptoms.    She was found to have acute stroke.  Assessment and Plan: Acute ischemic stroke in the left brachium pontis with vomiting and vertigo, severe narrowing in the ophthalmic segment of the right ICA, now with left facial droop: Continue aspirin, Plavix and statin.  Antiemetics as needed.  Continue meclizine.   She may be a candidate for rehab at acute inpatient rehab.  Follow-up with TOC to assist with disposition.    ESRD: she got HD today.  Follow-up with nephrologist for HD needs.her anemia stable.    Chronic diastolic CHF: Compensated 2D echo showed EF estimated at 55 to 60%, indeterminate LV diastolic parameters, mild mitral stenosis, no evidence of interatrial shunt   CAD s/p CABG and coronary stent: Continue aspirin and Plavix    Type 2 DM with hyperglycemia:NovoLog as needed for hyperglycemia.  Continue insulin glargine at 40 units nightly and NovoLog 20 units 3 times daily with meals.  Hemoglobin A1c 8.1 Home insulin regimen: Tresiba 75 mg  nightly, Humalog 20 units in the morning, 42 units at lunch and 52 units at dinner.    Hyperlipidemia: Continue Lipitor Lipid panel showed LDL 66, HDL 42, triglycerides 167, total cholesterol 141    Hypertension: Continue antihypertensives (hydralazine, losartan, metoprolol and amlodipine    Sinus bradycardia, occasional sinus pauses (up to 2 seconds) on telemetry: She is asymptomatic.  She is on metoprolol.  Monitor heart rate closely.  Sleep apnea Obesity with BMI 38.47 Contributing to her current condition. Advised diet, exercise and weight reduction.  Out of bed to chair. Incentive spirometry. Nursing supportive care. Fall, aspiration precautions. DVT prophylaxis   Code Status: Full Code  Subjective: Patient is seen and examined today morning. She is complaining of spinning sensation with eye open or closed. She wishes to go to inpatient rehab.  Physical Exam: Vitals:   11/30/23 1300 11/30/23 1322 11/30/23 1409 11/30/23 1548  BP: (!) 133/43 (!) 130/46  (!) 116/44  Pulse: (!) 58   68  Resp: 10   17  Temp:    98.1 F (36.7 C)  TempSrc:    Oral  SpO2: 100%   97%  Weight:   111.4 kg   Height:        General - Elderly obese Caucasian female, distress due to dizziness, spinning sensation. HEENT - PERRLA, EOMI, atraumatic head, non tender sinuses. Lung - distant breath sounds, diffuse rales, no rhonchi, wheezes. Heart - S1, S2 heard, no murmurs, rubs, trace pedal edema. Abdomen - Soft, non tender, bowel sounds good  Neuro - Alert, awake and oriented x 3, left facial droop noted. Skin - Warm and dry.  Data Reviewed:      Latest Ref Rng & Units 11/30/2023   10:44 AM 11/28/2023    9:00 AM 11/26/2023   12:28 PM  CBC  WBC 4.0 - 10.5 K/uL 11.2  9.8  13.1   Hemoglobin 12.0 - 15.0 g/dL 57.8  46.9  62.9   Hematocrit 36.0 - 46.0 % 34.0  34.0  34.8   Platelets 150 - 400 K/uL 247  259  268       Latest Ref Rng & Units 11/30/2023   10:44 AM 11/28/2023    9:00 AM 11/26/2023    12:28 PM  BMP  Glucose 70 - 99 mg/dL 528  413  244   BUN 8 - 23 mg/dL 62  51  36   Creatinine 0.44 - 1.00 mg/dL 0.10  2.72  5.36   Sodium 135 - 145 mmol/L 138  138  138   Potassium 3.5 - 5.1 mmol/L 4.0  3.8  3.7   Chloride 98 - 111 mmol/L 104  104  97   CO2 22 - 32 mmol/L 21  22  24    Calcium 8.9 - 10.3 mg/dL 9.3  9.4  9.6    No results found.   Family Communication: Discussed with patient, she understand and agree. All questions answereed.    Disposition: Status is: Inpatient Remains inpatient appropriate because: need inpatient rehab placement.  Planned Discharge Destination: Rehab     Time spent: 38 minutes  Author: Marcelino Duster, MD 11/30/2023 5:54 PM Secure chat 7am to 7pm For on call review [email or web address].

## 2023-12-01 DIAGNOSIS — R42 Dizziness and giddiness: Secondary | ICD-10-CM | POA: Diagnosis not present

## 2023-12-01 DIAGNOSIS — I639 Cerebral infarction, unspecified: Secondary | ICD-10-CM | POA: Diagnosis not present

## 2023-12-01 DIAGNOSIS — I2510 Atherosclerotic heart disease of native coronary artery without angina pectoris: Secondary | ICD-10-CM | POA: Diagnosis not present

## 2023-12-01 DIAGNOSIS — N184 Chronic kidney disease, stage 4 (severe): Secondary | ICD-10-CM | POA: Diagnosis not present

## 2023-12-01 LAB — GLUCOSE, CAPILLARY
Glucose-Capillary: 228 mg/dL — ABNORMAL HIGH (ref 70–99)
Glucose-Capillary: 220 mg/dL — ABNORMAL HIGH (ref 70–99)
Glucose-Capillary: 136 mg/dL — ABNORMAL HIGH (ref 70–99)
Glucose-Capillary: 92 mg/dL (ref 70–99)

## 2023-12-01 MED ORDER — INSULIN GLARGINE-YFGN 100 UNIT/ML ~~LOC~~ SOLN
60.0000 [IU] | Freq: Every day | SUBCUTANEOUS | Status: DC
  Administered 2023-12-01: 60 [IU] via SUBCUTANEOUS
  Filled 2023-12-01 (×2): qty 0.6

## 2023-12-01 MED ORDER — INSULIN ASPART 100 UNIT/ML IJ SOLN: 0.0000 [IU] | Freq: Every day | INTRAMUSCULAR | Status: DC

## 2023-12-01 MED ORDER — INSULIN ASPART 100 UNIT/ML IJ SOLN
0.0000 [IU] | Freq: Three times a day (TID) | INTRAMUSCULAR | Status: DC
  Administered 2023-12-01: 1 [IU] via SUBCUTANEOUS
  Filled 2023-12-01: qty 1

## 2023-12-01 NOTE — Plan of Care (Signed)
  Problem: Education: Goal: Knowledge of disease or condition will improve Outcome: Progressing Goal: Knowledge of secondary prevention will improve (MUST DOCUMENT ALL) Outcome: Progressing Goal: Knowledge of patient specific risk factors will improve Loraine Leriche N/A or DELETE if not current risk factor) Outcome: Progressing   Problem: Ischemic Stroke/TIA Tissue Perfusion: Goal: Complications of ischemic stroke/TIA will be minimized Outcome: Progressing   Problem: Coping: Goal: Will verbalize positive feelings about self Outcome: Progressing Goal: Will identify appropriate support needs Outcome: Progressing   Problem: Health Behavior/Discharge Planning: Goal: Ability to manage health-related needs will improve Outcome: Progressing Goal: Goals will be collaboratively established with patient/family Outcome: Progressing   Problem: Self-Care: Goal: Ability to participate in self-care as condition permits will improve Outcome: Progressing Goal: Verbalization of feelings and concerns over difficulty with self-care will improve Outcome: Progressing Goal: Ability to communicate needs accurately will improve Outcome: Progressing   Problem: Nutrition: Goal: Risk of aspiration will decrease Outcome: Progressing Goal: Dietary intake will improve Outcome: Progressing   Problem: Education: Goal: Knowledge of General Education information will improve Description: Including pain rating scale, medication(s)/side effects and non-pharmacologic comfort measures Outcome: Progressing   Problem: Health Behavior/Discharge Planning: Goal: Ability to manage health-related needs will improve Outcome: Progressing   Problem: Clinical Measurements: Goal: Ability to maintain clinical measurements within normal limits will improve Outcome: Progressing Goal: Will remain free from infection Outcome: Progressing Goal: Diagnostic test results will improve Outcome: Progressing Goal: Respiratory  complications will improve Outcome: Progressing Goal: Cardiovascular complication will be avoided Outcome: Progressing   Problem: Activity: Goal: Risk for activity intolerance will decrease Outcome: Progressing   Problem: Nutrition: Goal: Adequate nutrition will be maintained Outcome: Progressing   Problem: Coping: Goal: Level of anxiety will decrease Outcome: Progressing   Problem: Elimination: Goal: Will not experience complications related to bowel motility Outcome: Progressing Goal: Will not experience complications related to urinary retention Outcome: Progressing   Problem: Pain Management: Goal: General experience of comfort will improve Outcome: Progressing   Problem: Safety: Goal: Ability to remain free from injury will improve Outcome: Progressing   Problem: Skin Integrity: Goal: Risk for impaired skin integrity will decrease Outcome: Progressing   Problem: Education: Goal: Knowledge of disease and its progression will improve Outcome: Progressing Goal: Individualized Educational Video(s) Outcome: Progressing   Problem: Fluid Volume: Goal: Compliance with measures to maintain balanced fluid volume will improve Outcome: Progressing   Problem: Health Behavior/Discharge Planning: Goal: Ability to manage health-related needs will improve Outcome: Progressing   Problem: Nutritional: Goal: Ability to make healthy dietary choices will improve Outcome: Progressing   Problem: Clinical Measurements: Goal: Complications related to the disease process, condition or treatment will be avoided or minimized Outcome: Progressing

## 2023-12-01 NOTE — Progress Notes (Signed)
Physical Therapy Treatment Patient Details Name: Sharon Arias MRN: [id number] DOB: [birth year] Today's Date: 12/01/2023   History of Present Illness Patient is a 62 year old female who presents with vertigo and palsies of 6th and 7th cranial nerves on the left. Found to have acute ischemic infarct in the left brachium pontis. History of ESRD on hemodialysis, hypertension, hyperlipidemia, diabetes mellitus type II, CAD, CVA, neuropathy, retinopathy, sleep apnea    PT Comments  Increased activity tolerance this session. Patient continues to have sensation of room spinning that worsens with mobility. Education provided on strategies to compensate for impaired spatial awareness and dizziness with closing one eye, scanning the environment, etc. The patient increased gait distance and overall standing tolerance this session. Min A was required for intermittent staggering with upright activity. Recommend to continue PT to maximize independence with intensive rehabilitation > 3 hours/day needed after this hospital stay.    If plan is discharge home, recommend the following: A lot of help with walking and/or transfers;A little help with bathing/dressing/bathroom;Assistance with cooking/housework;Assist for transportation;Help with stairs or ramp for entrance   Can travel by private vehicle        Equipment Recommendations  None recommended by PT    Recommendations for Other Services Rehab consult     Precautions / Restrictions Precautions Precautions: Fall Precaution Comments: baseline vision issues, spinning sensation with mobility Restrictions Weight Bearing Restrictions Per Provider Order: No     Mobility  Bed Mobility Overal bed mobility: Needs Assistance Bed Mobility: Supine to Sit     Supine to sit: Contact guard, HOB elevated     General bed mobility comments: increased time required with increased dizziness reported with mobility    Transfers Overall transfer level: Needs  assistance Equipment used: Rolling walker (2 wheels) Transfers: Sit to/from Stand Sit to Stand: Min assist           General transfer comment: lifting assistance required for standing from bed and from toilet. patient required cues for safety and positioning with sitting on toilet due to impaired vision and spatial awareness    Ambulation/Gait Ambulation/Gait assistance: Min assist Gait Distance (Feet): 30 Feet Assistive device: Rolling walker (2 wheels) Gait Pattern/deviations: Staggering right, Decreased stride length, Step-through pattern Gait velocity: decreased     General Gait Details: steadying assistance required for ambulation. mild staggering to the right with cues for correction. patient intermittently closed right eye with improved spinning sensation. education provided on strategies to help with dizziness   Stairs             Wheelchair Mobility     Tilt Bed    Modified Rankin (Stroke Patients Only)       Balance Overall balance assessment: Needs assistance Sitting-balance support: Feet supported Sitting balance-Leahy Scale: Fair     Standing balance support: Bilateral upper extremity supported Standing balance-Leahy Scale: Poor Standing balance comment: external support required from rolling walker                            Cognition Arousal: Alert Behavior During Therapy: WFL for tasks assessed/performed Overall Cognitive Status: Within Functional Limits for tasks assessed                                          Exercises      General Comments  Pertinent Vitals/Pain Pain Assessment Pain Assessment: No/denies pain    Home Living                          Prior Function            PT Goals (current goals can now be found in the care plan section) Acute Rehab PT Goals Patient Stated Goal: to get to rehab PT Goal Formulation: With patient Time For Goal Achievement:  12/12/23 Potential to Achieve Goals: Good Progress towards PT goals: Progressing toward goals    Frequency    Min 1X/week      PT Plan      Co-evaluation              AM-PAC PT "6 Clicks" Mobility   Outcome Measure  Help needed turning from your back to your side while in a flat bed without using bedrails?: A Little Help needed moving from lying on your back to sitting on the side of a flat bed without using bedrails?: A Little Help needed moving to and from a bed to a chair (including a wheelchair)?: A Lot Help needed standing up from a chair using your arms (e.g., wheelchair or bedside chair)?: A Lot Help needed to walk in hospital room?: Total Help needed climbing 3-5 steps with a railing? : Total 6 Click Score: 12    End of Session   Activity Tolerance: Patient tolerated treatment well Patient left: in bed;with call bell/phone within reach;with bed alarm set (seated on edge of bed) Nurse Communication: Mobility status PT Visit Diagnosis: Difficulty in walking, not elsewhere classified (R26.2);Other symptoms and signs involving the nervous system (R29.898)     Time: 2536-6440 PT Time Calculation (min) (ACUTE ONLY): 19 min  Charges:    $Therapeutic Activity: 8-22 mins PT General Charges $$ ACUTE PT VISIT: 1 Visit                     Donna Bernard, PT, MPT    Ina Homes 12/01/2023, 11:40 AM

## 2023-12-01 NOTE — Progress Notes (Signed)
Progress Note   Patient: Sharon Arias ZHY:[id number] DOB: Aug 05, 1961 DOA: 11/26/2023     3 DOS: the patient was seen and examined on 12/01/2023   Brief hospital course: Sharon Arias is a 62 y.o. female with medical history significant for history of stroke, CAD s/p CABG and coronary stent on aspirin and Plavix, chronic diastolic CHF, ESRD on hemodialysis, type II DM, diabetic neuropathy, legal blindness, anemia of chronic disease, sleep apnea, obesity.  She presented to the hospital because of nausea, vomiting and dizziness.  She describes the dizziness as spinning sensation, as if the room is spinning.  She felt dizzy on 11/25/2023 around 10 AM.  She did not seek medical attention because she thought she would be okay.  She went for hemodialysis that same day.  However, when she got home, her symptoms had worsened.  She had nausea and multiple episodes of vomiting.  She presented to the hospital the following day because of worsening symptoms.    She was found to have acute stroke admitted to hospitalist service for further management evaluation.  Patient is currently on aspirin, Plavix and statin.  She has been having symptoms of spinning sensation.  Patient is awaiting inpatient rehab bed availability.  Assessment and Plan: Acute ischemic stroke in the left brachium pontis with vomiting and vertigo, severe narrowing in the ophthalmic segment of the right ICA, now with left facial droop: Continue aspirin, Plavix and statin.  Antiemetics as needed.  Continue meclizine.   She is awaiting acute inpatient rehab bed placement.  Follow-up with TOC to assist with disposition.    ESRD: Follow-up with nephrologist for HD needs.  Anemia of chronic disease: her hemoglobin 11.6 stable.  No active bleeding.      Chronic diastolic CHF: No acute exacerbation. 2D echo showed EF estimated at 55 to 60%, indeterminate LV diastolic parameters, mild mitral stenosis, no evidence of interatrial shunt.  CAD s/p  CABG and coronary stent: Continue aspirin and Plavix    Type 2 DM with hyperglycemia:  Increase insulin glargine to 60 units nightly and NovoLog 20 units 3 times daily with meals.  Hemoglobin A1c 8.1. Sliding scale orders placed. She is on higher dose insulin at home.    Hyperlipidemia: Continue Lipitor    Hypertension: Continue antihypertensives (hydralazine, losartan, metoprolol and amlodipine    Sinus bradycardia, occasional sinus pauses (up to 2 seconds) on telemetry: She is asymptomatic.  She is on metoprolol.  Monitor heart rate closely.  Sleep apnea Obesity with BMI 38.47 Contributing to her current condition. Advised diet, exercise and weight reduction.  Out of bed to chair. Incentive spirometry. Nursing supportive care. Fall, aspiration precautions. DVT prophylaxis   Code Status: Full Code  Subjective: Patient is seen and examined today morning. She does have vertigo. Eating fair. Blood sugars in 220s. States she takes larger insulin doses at home.  Physical Exam: Vitals:   11/30/23 1409 11/30/23 1548 11/30/23 1951 12/01/23 0815  BP:  (!) 116/44 (!) 116/52 (!) 148/47  Pulse:  68 76 (!) 59  Resp:  17 18 18   Temp:  98.1 F (36.7 C) 98.6 F (37 C) 97.9 F (36.6 C)  TempSrc:  Oral    SpO2:  97% 97% 95%  Weight: 111.4 kg     Height:        General - Elderly obese Caucasian female, distress due to dizziness, spinning sensation. HEENT - PERRLA, EOMI, atraumatic head, non tender sinuses. Lung - distant breath sounds, diffuse rales, no rhonchi,  wheezes. Heart - S1, S2 heard, no murmurs, rubs, trace pedal edema. Abdomen - Soft, non tender, bowel sounds good Neuro - Alert, awake and oriented x 3, left facial droop noted. Skin - Warm and dry.  Data Reviewed:      Latest Ref Rng & Units 11/30/2023   10:44 AM 11/28/2023    9:00 AM 11/26/2023   12:28 PM  CBC  WBC 4.0 - 10.5 K/uL 11.2  9.8  13.1   Hemoglobin 12.0 - 15.0 g/dL 44.0  10.2  72.5   Hematocrit 36.0 -  46.0 % 34.0  34.0  34.8   Platelets 150 - 400 K/uL 247  259  268       Latest Ref Rng & Units 11/30/2023   10:44 AM 11/28/2023    9:00 AM 11/26/2023   12:28 PM  BMP  Glucose 70 - 99 mg/dL 366  440  347   BUN 8 - 23 mg/dL 62  51  36   Creatinine 0.44 - 1.00 mg/dL 4.25  9.56  3.87   Sodium 135 - 145 mmol/L 138  138  138   Potassium 3.5 - 5.1 mmol/L 4.0  3.8  3.7   Chloride 98 - 111 mmol/L 104  104  97   CO2 22 - 32 mmol/L 21  22  24    Calcium 8.9 - 10.3 mg/dL 9.3  9.4  9.6    No results found.   Family Communication: Discussed with patient, she understand and agree. All questions answereed.  Disposition: Status is: Inpatient Remains inpatient appropriate because: need inpatient rehab placement.  Planned Discharge Destination: Rehab dc plan tomorrow.     Time spent: 37 minutes  Author: Marcelino Duster, MD 12/01/2023 2:13 PM Secure chat 7am to 7pm For on call review [email or web address].

## 2023-12-01 NOTE — Progress Notes (Signed)
Occupational Therapy Treatment Patient Details Name: Sharon Arias MRN: [id number] DOB: [birth year] Today's Date: 12/01/2023   History of present illness Patient is a 62 year old female who presents with vertigo and palsies of 6th and 7th cranial nerves on the left. Found to have acute ischemic infarct in the left brachium pontis. History of ESRD on hemodialysis, hypertension, hyperlipidemia, diabetes mellitus type II, CAD, CVA, neuropathy, retinopathy, sleep apnea   OT comments  Pt seen for OT treatment on this date. Upon arrival to room pt supine in bed, agreeable to tx. Pt completed all bed mobility with CGA +use of rails. Pt completed fine motor control task picking washers up from table top and placing on dowel and then storing in palm, noted patients L hand requiring increased time. Pt requested brushing teeth and completed grooming tasks (oral care and washing face) sitting at the EOB with supervision. Pt left supine in bed with call bell within reach and all needs met. Pt making good progress toward goals, will continue to follow POC. Discharge recommendation remains appropriate.        If plan is discharge home, recommend the following:  A lot of help with walking and/or transfers;A lot of help with bathing/dressing/bathroom;Assist for transportation;Help with stairs or ramp for entrance   Equipment Recommendations  Other (comment)    Recommendations for Other Services      Precautions / Restrictions Precautions Precautions: Fall Precaution Comments: baseline vision issues, spinning sensation with mobility Restrictions Weight Bearing Restrictions Per Provider Order: No       Mobility Bed Mobility Overal bed mobility: Needs Assistance Bed Mobility: Supine to Sit     Supine to sit: Contact guard, HOB elevated Sit to supine: Contact guard assist, Used rails   General bed mobility comments: increased time required with increased dizziness reported with mobility Patient  Response: Cooperative  Transfers                         Balance Overall balance assessment: Needs assistance Sitting-balance support: Feet supported Sitting balance-Leahy Scale: Fair     Standing balance support: Bilateral upper extremity supported Standing balance-Leahy Scale: Poor                             ADL either performed or assessed with clinical judgement   ADL Overall ADL's : Needs assistance/impaired                                       General ADL Comments: Pt requested brushing teeth and completed grooming tasks (oral care and washing face) sitting at the EOB with supervision. Pt completed fine motor control task picking washers up from table top and placing on dowel, noted patients L hand requiring increased time. Pt completed fine motor control task picking washers up from table top and storing in palm, noted L hand requiring increased time.    Extremity/Trunk Assessment Upper Extremity Assessment Upper Extremity Assessment: LUE deficits/detail LUE Deficits / Details: Noted pt's LUE fine and gross coordination to be slow   Lower Extremity Assessment Lower Extremity Assessment: Defer to PT evaluation        Vision       Perception     Praxis      Cognition Arousal: Alert Behavior During Therapy: [facility] for tasks assessed/performed Overall Cognitive Status: Within  Functional Limits for tasks assessed                                          Exercises      Shoulder Instructions       General Comments      Pertinent Vitals/ Pain       Pain Assessment Pain Assessment: No/denies pain  Home Living                                          Prior Functioning/Environment              Frequency  Min 1X/week        Progress Toward Goals  OT Goals(current goals can now be found in the care plan section)  Progress towards OT goals: Progressing toward goals      Plan      Co-evaluation                 AM-PAC OT "6 Clicks" Daily Activity     Outcome Measure   Help from another person eating meals?: None Help from another person taking care of personal grooming?: None Help from another person toileting, which includes using toliet, bedpan, or urinal?: A Lot Help from another person bathing (including washing, rinsing, drying)?: A Lot Help from another person to put on and taking off regular upper body clothing?: A Little Help from another person to put on and taking off regular lower body clothing?: A Lot 6 Click Score: 17    End of Session    OT Visit Diagnosis: Unsteadiness on feet (R26.81);Other abnormalities of gait and mobility (R26.89);Dizziness and giddiness (R42);Muscle weakness (generalized) (M62.81)   Activity Tolerance Patient tolerated treatment well   Patient Left in bed;with call bell/phone within reach;with bed alarm set   Nurse Communication          Time: 2202-5427 OT Time Calculation (min): 25 min  Charges:    Butch Penny, SOT

## 2023-12-01 NOTE — Progress Notes (Signed)
[facility]  ROUNDING NOTE   Subjective:   [name] was admitted to [facility] on 11/26/2023 for Morbid obesity (HCC) [E66.01] Dizziness [R42] Acute ischemic stroke [facility]) [I63.9] Type 2 diabetes mellitus with hyperglycemia, with long-term current use of insulin (HCC) [E11.65, Z79.4]  Update Seen laying in bed Alert Denies pain Dizziness remains priority concern  Objective:  Vital signs in last 24 hours:  Temp:  [97.9 F (36.6 C)-98.6 F (37 C)] 97.9 F (36.6 C) (12/12 0815) Pulse Rate:  [58-76] 59 (12/12 0815) Resp:  [10-18] 18 (12/12 0815) BP: (116-148)/(43-52) 148/47 (12/12 0815) SpO2:  [95 %-100 %] 95 % (12/12 0815) FiO2 (%):  [21 %] 21 % (12/11 2337) Weight:  [111.4 kg] 111.4 kg (12/11 1409)  Weight change: -3.2 kg Filed Weights   11/30/23 0308 11/30/23 1024 11/30/23 1409  Weight: 114.4 kg 111.2 kg 111.4 kg    Intake/Output: I/O last 3 completed shifts: In: 50 [P.O.:50] Out: 0    Intake/Output this shift:  No intake/output data recorded.  Physical Exam: General: NAD  Head: Normocephalic, atraumatic. Moist oral mucosal membranes  Eyes: Eyes closed  Lungs:  Clear to auscultation, normal effort  Heart: Regular rate and rhythm  Abdomen:  Soft, nontender  Extremities:  no peripheral edema.  Neurologic: Alert and oriented, moving all four extremities  Skin: No lesions  Access: Left AVF    Basic Metabolic Panel: Recent Labs  Lab 11/26/23 1228 11/28/23 0900 11/30/23 1044  NA 138 138 138  K 3.7 3.8 4.0  CL 97* 104 104  CO2 24 22 21*  GLUCOSE 297* 164* 157*  BUN 36* 51* 62*  CREATININE 3.52* 4.11* 4.63*  CALCIUM 9.6 9.4 9.3  PHOS  --  4.9* 5.9*    Liver Function Tests: Recent Labs  Lab 11/28/23 0900 11/30/23 1044  ALBUMIN 3.7 3.6   No results for input(s): "LIPASE", "AMYLASE" in the last 168 hours. No results for input(s): "AMMONIA" in the last 168 hours.  CBC: Recent Labs  Lab 11/26/23 1228 11/28/23 0900 11/30/23 1044   WBC 13.1* 9.8 11.2*  HGB 11.7* 11.4* 11.6*  HCT 34.8* 34.0* 34.0*  MCV 91.6 90.7 89.2  PLT 268 259 247    Cardiac Enzymes: No results for input(s): "CKTOTAL", "CKMB", "CKMBINDEX", "TROPONINI" in the last 168 hours.  BNP: Invalid input(s): "POCBNP"  CBG: Recent Labs  Lab 11/29/23 1653 11/30/23 0631 11/30/23 1704 11/30/23 1955 12/01/23 0815  GLUCAP 121* 142* 263* 356* 228*    Microbiology: Results for orders placed or performed during the hospital encounter of 11/26/23  MRSA Next Gen by PCR, Nasal     Status: None   Collection Time: 11/29/23  9:13 PM   Specimen: Nasal Mucosa; Nasal Swab  Result Value Ref Range Status   MRSA by PCR Next Gen NOT DETECTED NOT DETECTED Final    Comment: (NOTE) The GeneXpert MRSA Assay (FDA approved for NASAL specimens only), is one component of a comprehensive MRSA colonization surveillance program. It is not intended to diagnose MRSA infection nor to guide or monitor treatment for MRSA infections. Test performance is not FDA approved in patients less than 42 years old. Performed at [facility], [address] [place], [place] [address]     Coagulation Studies: No results for input(s): "LABPROT", "INR" in the last 72 hours.   Urinalysis: No results for input(s): "COLORURINE", "LABSPEC", "PHURINE", "GLUCOSEU", "HGBUR", "BILIRUBINUR", "KETONESUR", "PROTEINUR", "UROBILINOGEN", "NITRITE", "LEUKOCYTESUR" in the last 72 hours.  Invalid input(s): "APPERANCEUR"  Imaging: No results found.   Medications:       stroke: early stages of recovery book   Does not apply Once   (feeding supplement) PROSource Plus  30 mL Oral BID BM   allopurinol  300 mg Oral q AM   amLODipine  5 mg Oral QHS   aspirin  81 mg Oral Daily   atorvastatin  80 mg Oral Daily   Chlorhexidine Gluconate Cloth  6 each Topical Q0600   clopidogrel  75 mg Oral Daily   furosemide  40 mg Oral Q T,Th,S,Su   heparin  5,000 Units Subcutaneous Q8H    hydrALAZINE  50 mg Oral BID   insulin aspart  20 Units Subcutaneous TID WC   insulin glargine-yfgn  40 Units Subcutaneous QHS   losartan  100 mg Oral Daily   metoprolol tartrate  50 mg Oral BID   multivitamin  1 tablet Oral QHS   ondansetron (ZOFRAN) IV  4 mg Intravenous Once   sevelamer carbonate  800 mg Oral TID AC   acetaminophen **OR** acetaminophen (TYLENOL) oral liquid 160 mg/5 mL **OR** acetaminophen, meclizine, mouth rinse  Assessment/ Plan:  [name] is a 62 y.o.  female with end stage renal disease on hemodialysis, hypertension, hyperlipidemia, diabetes mellitus type II, coronary artery disease, CVA, diabetes neuropathy, diabetes retinopathy, sleep apnea who is admitted to [facility] on 11/26/2023 for Morbid obesity (HCC) [E66.01] Dizziness [R42] Acute ischemic stroke (HCC) [I63.9] Type 2 diabetes mellitus with hyperglycemia, with long-term current use of insulin (HCC) [E11.65, Z79.4]  [facility] left AVF 115kg  End Stage Renal Disease:  - Next treatment scheduled for Friday.   Hypertension: with acute CVA.  - Allow for permissive hypertension.  - Home regimen of amlodipine, furosemide, losartan, and metoprolol.  -Neurology to follow-up outpatient.   Anemia with chronic kidney disease: hemoglobin 11.6.  Secondary Hyperparathyroidism -Hyperphosphatemia noted. - Continue sevelamer with meals.    LOS: 3 Sharon Arias 12/12/202411:09 AM

## 2023-12-01 NOTE — Inpatient Diabetes Management (Signed)
Inpatient Diabetes Program Recommendations  AACE/ADA: New Consensus Statement on Inpatient Glycemic Control (2015)  Target Ranges:  Prepandial:   less than 140 mg/dL      Peak postprandial:   less than 180 mg/dL (1-2 hours)      Critically ill patients:  140 - 180 mg/dL   Lab Results  Component Value Date   GLUCAP 228 (H) 12/01/2023   HGBA1C 8.1 (H) 11/26/2023    Review of Glycemic Control  Latest Reference Range & Units 11/30/23 06:31 11/30/23 17:04 11/30/23 19:55 12/01/23 08:15  Glucose-Capillary 70 - 99 mg/dL 027 (H) 253 (H) 664 (H) 228 (H)  (H): Data is abnormally high  Diabetes history: DM2 Outpatient Diabetes medications:  Humalog 20 units with BF, 42 units with lunch and 52 units with dinner, Tresiba 75 units QHS Current orders for Inpatient glycemic control:  Semglee 40 units QHS, Novolog 20 units TID  Inpatient Diabetes Program Recommendations:    Please consider:  Novolog 0-15 units TID and HS Semglee 50 units at bedtime  Will continue to follow while inpatient.  Thank you, Dulce Sellar, MSN, CDCES Diabetes Coordinator Inpatient Diabetes Program [phone] (team pager from 8a-5p)

## 2023-12-01 NOTE — Care Management Important Message (Signed)
Important Message  Patient Details  Name: BIJOU SHOULDERS MRN: [id number] Date of Birth: 11/14/1961   Important Message Given:  N/A - LOS <3 / Initial given by admissions     Olegario Messier A Arienne Gartin 12/01/2023, 8:08 AM

## 2023-12-01 NOTE — Plan of Care (Signed)
  Problem: Education: Goal: Knowledge of disease or condition will improve Outcome: Progressing   Problem: Coping: Goal: Will verbalize positive feelings about self Outcome: Progressing   Problem: Health Behavior/Discharge Planning: Goal: Ability to manage health-related needs will improve Outcome: Progressing   Problem: Self-Care: Goal: Ability to participate in self-care as condition permits will improve Outcome: Progressing   Problem: Nutrition: Goal: Risk of aspiration will decrease Outcome: Progressing   Problem: Education: Goal: Knowledge of General Education information will improve Description: Including pain rating scale, medication(s)/side effects and non-pharmacologic comfort measures Outcome: Progressing   Problem: Activity: Goal: Risk for activity intolerance will decrease Outcome: Progressing   Problem: Nutrition: Goal: Adequate nutrition will be maintained Outcome: Progressing   Problem: Coping: Goal: Level of anxiety will decrease Outcome: Progressing   Problem: Elimination: Goal: Will not experience complications related to bowel motility Outcome: Progressing   Problem: Pain Management: Goal: General experience of comfort will improve Outcome: Progressing   Problem: Safety: Goal: Ability to remain free from injury will improve Outcome: Progressing   Problem: Skin Integrity: Goal: Risk for impaired skin integrity will decrease Outcome: Progressing   Problem: Coping: Goal: Ability to adjust to condition or change in health will improve Outcome: Progressing   Problem: Metabolic: Goal: Ability to maintain appropriate glucose levels will improve Outcome: Progressing   Problem: Nutritional: Goal: Maintenance of adequate nutrition will improve Outcome: Progressing   Problem: Skin Integrity: Goal: Risk for impaired skin integrity will decrease Outcome: Progressing   Problem: Tissue Perfusion: Goal: Adequacy of tissue perfusion will  improve Outcome: Progressing

## 2023-12-01 NOTE — Progress Notes (Signed)
Inpatient Rehab Admissions Coordinator:   I received insurance approval for CIR.  I do not have a bed for this patient to admit to CIR today.  Will discuss HD schedule with MD/TOC for potential transition to CIR tomorrow.   Estill Dooms, PT, DPT Admissions Coordinator [phone] 12/01/23  1:37 PM

## 2023-12-01 NOTE — Progress Notes (Signed)
Inpatient Rehab Admissions Coordinator:   Continue to await determination from [facility] Medicare.    Estill Dooms, PT, DPT Admissions Coordinator [phone] 12/01/23  12:00 PM

## 2023-12-01 NOTE — Progress Notes (Signed)
[facility] was not available for 11/30/2023 night time meds. Medication was not received from pharmacy until 2330. Patient was asleep at this time. RN offered med to patient w/ 12/12 morning meds. Patient stated "that is okay, I will just take it tonight."

## 2023-12-01 NOTE — TOC Progression Note (Signed)
Transition of Care [facility]) - Progression Note    Patient Details  Name: Sharon Arias MRN: [id number] Date of Birth: 1961-04-23  Transition of Care [facility]) CM/SW Contact  Allena Katz, LCSW Phone Number: 12/01/2023, 2:09 PM  Clinical Narrative:   [facility] approved for CIR. CIR to work on getting pt there after dialysis tomorrow.         Expected Discharge Plan and Services                                               Social Determinants of Health (SDOH) Interventions SDOH Screenings   Food Insecurity: No Food Insecurity (11/27/2023)  Housing: Low Risk  (11/27/2023)  Transportation Needs: No Transportation Needs (11/27/2023)  Utilities: Not At Risk (11/27/2023)  Depression (PHQ2-9): Low Risk  (04/22/2020)  Tobacco Use: Low Risk  (11/26/2023)    Readmission Risk Interventions     No data to display

## 2023-12-02 ENCOUNTER — Other Ambulatory Visit: Payer: Self-pay

## 2023-12-02 ENCOUNTER — Encounter (HOSPITAL_COMMUNITY): Payer: Self-pay | Admitting: Physical Medicine & Rehabilitation

## 2023-12-02 ENCOUNTER — Inpatient Hospital Stay (HOSPITAL_COMMUNITY)
Admission: AD | Admit: 2023-12-02 | Discharge: 2023-12-15 | DRG: 056 | Disposition: A | Discharge status: Home Health | Payer: Medicare Other | Source: Other Acute Inpatient Hospital | Attending: Physical Medicine & Rehabilitation | Admitting: Physical Medicine & Rehabilitation

## 2023-12-02 DIAGNOSIS — R0989 Other specified symptoms and signs involving the circulatory and respiratory systems: Secondary | ICD-10-CM | POA: Diagnosis present

## 2023-12-02 DIAGNOSIS — E669 Obesity, unspecified: Secondary | ICD-10-CM | POA: Diagnosis present

## 2023-12-02 DIAGNOSIS — I69392 Facial weakness following cerebral infarction: Secondary | ICD-10-CM

## 2023-12-02 DIAGNOSIS — D631 Anemia in chronic kidney disease: Secondary | ICD-10-CM | POA: Diagnosis present

## 2023-12-02 DIAGNOSIS — I252 Old myocardial infarction: Secondary | ICD-10-CM

## 2023-12-02 DIAGNOSIS — I5032 Chronic diastolic (congestive) heart failure: Secondary | ICD-10-CM | POA: Diagnosis present

## 2023-12-02 DIAGNOSIS — Z794 Long term (current) use of insulin: Secondary | ICD-10-CM

## 2023-12-02 DIAGNOSIS — M5431 Sciatica, right side: Secondary | ICD-10-CM | POA: Diagnosis present

## 2023-12-02 DIAGNOSIS — N2581 Secondary hyperparathyroidism of renal origin: Secondary | ICD-10-CM | POA: Diagnosis present

## 2023-12-02 DIAGNOSIS — N3000 Acute cystitis without hematuria: Secondary | ICD-10-CM | POA: Diagnosis present

## 2023-12-02 DIAGNOSIS — E11319 Type 2 diabetes mellitus with unspecified diabetic retinopathy without macular edema: Secondary | ICD-10-CM | POA: Diagnosis present

## 2023-12-02 DIAGNOSIS — E1165 Type 2 diabetes mellitus with hyperglycemia: Secondary | ICD-10-CM | POA: Diagnosis not present

## 2023-12-02 DIAGNOSIS — Z8701 Personal history of pneumonia (recurrent): Secondary | ICD-10-CM

## 2023-12-02 DIAGNOSIS — G4733 Obstructive sleep apnea (adult) (pediatric): Secondary | ICD-10-CM | POA: Diagnosis present

## 2023-12-02 DIAGNOSIS — H532 Diplopia: Secondary | ICD-10-CM | POA: Diagnosis present

## 2023-12-02 DIAGNOSIS — Z79899 Other long term (current) drug therapy: Secondary | ICD-10-CM | POA: Diagnosis not present

## 2023-12-02 DIAGNOSIS — Z992 Dependence on renal dialysis: Secondary | ICD-10-CM | POA: Diagnosis not present

## 2023-12-02 DIAGNOSIS — I639 Cerebral infarction, unspecified: Secondary | ICD-10-CM | POA: Diagnosis not present

## 2023-12-02 DIAGNOSIS — E8889 Other specified metabolic disorders: Secondary | ICD-10-CM | POA: Diagnosis present

## 2023-12-02 DIAGNOSIS — I132 Hypertensive heart and chronic kidney disease with heart failure and with stage 5 chronic kidney disease, or end stage renal disease: Secondary | ICD-10-CM | POA: Diagnosis present

## 2023-12-02 DIAGNOSIS — I69393 Ataxia following cerebral infarction: Principal | ICD-10-CM

## 2023-12-02 DIAGNOSIS — E785 Hyperlipidemia, unspecified: Secondary | ICD-10-CM | POA: Diagnosis present

## 2023-12-02 DIAGNOSIS — I2510 Atherosclerotic heart disease of native coronary artery without angina pectoris: Secondary | ICD-10-CM | POA: Diagnosis present

## 2023-12-02 DIAGNOSIS — R34 Anuria and oliguria: Secondary | ICD-10-CM | POA: Diagnosis present

## 2023-12-02 DIAGNOSIS — I959 Hypotension, unspecified: Secondary | ICD-10-CM | POA: Diagnosis not present

## 2023-12-02 DIAGNOSIS — L816 Other disorders of diminished melanin formation: Secondary | ICD-10-CM | POA: Diagnosis present

## 2023-12-02 DIAGNOSIS — D329 Benign neoplasm of meninges, unspecified: Secondary | ICD-10-CM | POA: Diagnosis present

## 2023-12-02 DIAGNOSIS — F419 Anxiety disorder, unspecified: Secondary | ICD-10-CM | POA: Diagnosis present

## 2023-12-02 DIAGNOSIS — G510 Bell's palsy: Secondary | ICD-10-CM | POA: Diagnosis present

## 2023-12-02 DIAGNOSIS — R011 Cardiac murmur, unspecified: Secondary | ICD-10-CM | POA: Diagnosis present

## 2023-12-02 DIAGNOSIS — K5900 Constipation, unspecified: Secondary | ICD-10-CM | POA: Diagnosis present

## 2023-12-02 DIAGNOSIS — R42 Dizziness and giddiness: Secondary | ICD-10-CM | POA: Diagnosis not present

## 2023-12-02 DIAGNOSIS — E1140 Type 2 diabetes mellitus with diabetic neuropathy, unspecified: Secondary | ICD-10-CM | POA: Diagnosis present

## 2023-12-02 DIAGNOSIS — I700 Atherosclerosis of aorta: Secondary | ICD-10-CM | POA: Diagnosis present

## 2023-12-02 DIAGNOSIS — Z634 Disappearance and death of family member: Secondary | ICD-10-CM

## 2023-12-02 DIAGNOSIS — I69398 Other sequelae of cerebral infarction: Secondary | ICD-10-CM

## 2023-12-02 DIAGNOSIS — Z7901 Long term (current) use of anticoagulants: Secondary | ICD-10-CM

## 2023-12-02 DIAGNOSIS — Z7902 Long term (current) use of antithrombotics/antiplatelets: Secondary | ICD-10-CM

## 2023-12-02 DIAGNOSIS — Z951 Presence of aortocoronary bypass graft: Secondary | ICD-10-CM

## 2023-12-02 DIAGNOSIS — E1122 Type 2 diabetes mellitus with diabetic chronic kidney disease: Secondary | ICD-10-CM | POA: Diagnosis present

## 2023-12-02 DIAGNOSIS — R5381 Other malaise: Secondary | ICD-10-CM | POA: Diagnosis present

## 2023-12-02 DIAGNOSIS — Z7982 Long term (current) use of aspirin: Secondary | ICD-10-CM

## 2023-12-02 DIAGNOSIS — Z6839 Body mass index (BMI) 39.0-39.9, adult: Secondary | ICD-10-CM

## 2023-12-02 DIAGNOSIS — I69322 Dysarthria following cerebral infarction: Secondary | ICD-10-CM

## 2023-12-02 DIAGNOSIS — N186 End stage renal disease: Secondary | ICD-10-CM | POA: Diagnosis present

## 2023-12-02 DIAGNOSIS — E1121 Type 2 diabetes mellitus with diabetic nephropathy: Secondary | ICD-10-CM | POA: Diagnosis not present

## 2023-12-02 DIAGNOSIS — H548 Legal blindness, as defined in USA: Secondary | ICD-10-CM | POA: Diagnosis present

## 2023-12-02 DIAGNOSIS — Z884 Allergy status to anesthetic agent status: Secondary | ICD-10-CM

## 2023-12-02 DIAGNOSIS — T380X5A Adverse effect of glucocorticoids and synthetic analogues, initial encounter: Secondary | ICD-10-CM | POA: Diagnosis not present

## 2023-12-02 DIAGNOSIS — Z888 Allergy status to other drugs, medicaments and biological substances status: Secondary | ICD-10-CM

## 2023-12-02 DIAGNOSIS — M109 Gout, unspecified: Secondary | ICD-10-CM | POA: Diagnosis present

## 2023-12-02 LAB — CBC
HCT: 36.8 % (ref 36.0–46.0)
Hemoglobin: 12.4 g/dL (ref 12.0–15.0)
MCH: 30.5 pg (ref 26.0–34.0)
MCHC: 33.7 g/dL (ref 30.0–36.0)
MCV: 90.6 fL (ref 80.0–100.0)
Platelets: 244 10*3/uL (ref 150–400)
RBC: 4.06 MIL/uL (ref 3.87–5.11)
RDW: 14.1 % (ref 11.5–15.5)
WBC: 9.6 10*3/uL (ref 4.0–10.5)
nRBC: 0 % (ref 0.0–0.2)

## 2023-12-02 LAB — CREATININE, SERUM
Creatinine, Ser: 2.92 mg/dL — ABNORMAL HIGH (ref 0.44–1.00)
GFR, Estimated: 18 mL/min — ABNORMAL LOW (ref >60)

## 2023-12-02 LAB — GLUCOSE, CAPILLARY
Glucose-Capillary: 172 mg/dL — ABNORMAL HIGH (ref 70–99)
Glucose-Capillary: 272 mg/dL — ABNORMAL HIGH (ref 70–99)

## 2023-12-02 MED ORDER — ACETAMINOPHEN 160 MG/5ML PO SOLN: 650.0000 mg | ORAL | Status: DC | PRN

## 2023-12-02 MED ORDER — ACETAMINOPHEN 650 MG RE SUPP
650.0000 mg | RECTAL | Status: DC | PRN
  Filled 2023-12-02: qty 1

## 2023-12-02 MED ORDER — ALLOPURINOL 300 MG PO TABS
300.0000 mg | ORAL_TABLET | Freq: Every day | ORAL | Status: DC
  Administered 2023-12-03 – 2023-12-15 (×13): 300 mg via ORAL
  Filled 2023-12-02 (×14): qty 1

## 2023-12-02 MED ORDER — LIDOCAINE-PRILOCAINE 2.5-2.5 % EX CREA: 1.0000 | TOPICAL_CREAM | CUTANEOUS | Status: DC | PRN

## 2023-12-02 MED ORDER — INSULIN GLARGINE-YFGN 100 UNIT/ML ~~LOC~~ SOLN
60.0000 [IU] | Freq: Every day | SUBCUTANEOUS | Status: DC
  Administered 2023-12-02: 60 [IU] via SUBCUTANEOUS
  Filled 2023-12-02 (×2): qty 0.6

## 2023-12-02 MED ORDER — METOPROLOL TARTRATE 50 MG PO TABS
50.0000 mg | ORAL_TABLET | Freq: Two times a day (BID) | ORAL | Status: DC
  Administered 2023-12-02 – 2023-12-15 (×20): 50 mg via ORAL
  Filled 2023-12-02 (×18): qty 1
  Filled 2023-12-02: qty 2
  Filled 2023-12-02 (×5): qty 1

## 2023-12-02 MED ORDER — INSULIN ASPART 100 UNIT/ML IJ SOLN
20.0000 [IU] | Freq: Three times a day (TID) | INTRAMUSCULAR | Status: DC
  Administered 2023-12-02 – 2023-12-15 (×31): 20 [IU] via SUBCUTANEOUS

## 2023-12-02 MED ORDER — SEVELAMER CARBONATE 800 MG PO TABS
800.0000 mg | ORAL_TABLET | Freq: Three times a day (TID) | ORAL | Status: DC
  Administered 2023-12-02 – 2023-12-15 (×32): 800 mg via ORAL
  Filled 2023-12-02 (×37): qty 1

## 2023-12-02 MED ORDER — PENTAFLUOROPROP-TETRAFLUOROETH EX AERO
INHALATION_SPRAY | CUTANEOUS | Status: AC
  Filled 2023-12-02: qty 30

## 2023-12-02 MED ORDER — HEPARIN SODIUM (PORCINE) 1000 UNIT/ML DIALYSIS: 1000.0000 [IU] | INTRAMUSCULAR | Status: DC | PRN

## 2023-12-02 MED ORDER — INSULIN ASPART 100 UNIT/ML IJ SOLN
0.0000 [IU] | Freq: Three times a day (TID) | INTRAMUSCULAR | Status: DC
  Administered 2023-12-02 – 2023-12-03 (×2): 2 [IU] via SUBCUTANEOUS
  Administered 2023-12-03: 3 [IU] via SUBCUTANEOUS
  Administered 2023-12-03: 5 [IU] via SUBCUTANEOUS
  Administered 2023-12-04: 3 [IU] via SUBCUTANEOUS
  Administered 2023-12-04: 2 [IU] via SUBCUTANEOUS
  Administered 2023-12-04: 5 [IU] via SUBCUTANEOUS
  Administered 2023-12-05: 1 [IU] via SUBCUTANEOUS
  Administered 2023-12-06 (×3): 2 [IU] via SUBCUTANEOUS
  Administered 2023-12-07: 1 [IU] via SUBCUTANEOUS
  Administered 2023-12-07 (×2): 2 [IU] via SUBCUTANEOUS
  Administered 2023-12-08: 1 [IU] via SUBCUTANEOUS
  Administered 2023-12-08 (×2): 2 [IU] via SUBCUTANEOUS
  Administered 2023-12-09: 1 [IU] via SUBCUTANEOUS
  Administered 2023-12-10 (×2): 3 [IU] via SUBCUTANEOUS
  Administered 2023-12-10 – 2023-12-12 (×3): 2 [IU] via SUBCUTANEOUS
  Administered 2023-12-12: 3 [IU] via SUBCUTANEOUS
  Administered 2023-12-13: 2 [IU] via SUBCUTANEOUS
  Administered 2023-12-13: 9 [IU] via SUBCUTANEOUS
  Administered 2023-12-13: 3 [IU] via SUBCUTANEOUS
  Administered 2023-12-14: 7 [IU] via SUBCUTANEOUS
  Administered 2023-12-14: 3 [IU] via SUBCUTANEOUS
  Administered 2023-12-14: 9 [IU] via SUBCUTANEOUS
  Administered 2023-12-15: 2 [IU] via SUBCUTANEOUS

## 2023-12-02 MED ORDER — ACETAMINOPHEN 325 MG PO TABS
650.0000 mg | ORAL_TABLET | ORAL | Status: DC | PRN
  Administered 2023-12-04 – 2023-12-10 (×6): 650 mg via ORAL
  Filled 2023-12-02 (×8): qty 2

## 2023-12-02 MED ORDER — HEPARIN SODIUM (PORCINE) 5000 UNIT/ML IJ SOLN: 5000.0000 [IU] | Freq: Three times a day (TID) | INTRAMUSCULAR | Status: DC

## 2023-12-02 MED ORDER — HEPARIN SODIUM (PORCINE) 5000 UNIT/ML IJ SOLN
5000.0000 [IU] | Freq: Three times a day (TID) | INTRAMUSCULAR | Status: DC
  Administered 2023-12-02 – 2023-12-09 (×19): 5000 [IU] via SUBCUTANEOUS
  Filled 2023-12-02 (×20): qty 1

## 2023-12-02 MED ORDER — INSULIN ASPART 100 UNIT/ML IJ SOLN: 0.0000 [IU] | Freq: Three times a day (TID) | INTRAMUSCULAR | Status: DC

## 2023-12-02 MED ORDER — CLOPIDOGREL BISULFATE 75 MG PO TABS
75.0000 mg | ORAL_TABLET | Freq: Every day | ORAL | Status: DC
  Administered 2023-12-02 – 2023-12-15 (×14): 75 mg via ORAL
  Filled 2023-12-02 (×14): qty 1

## 2023-12-02 MED ORDER — AMLODIPINE BESYLATE 5 MG PO TABS
5.0000 mg | ORAL_TABLET | Freq: Every day | ORAL | Status: DC
  Administered 2023-12-02 – 2023-12-14 (×12): 5 mg via ORAL
  Filled 2023-12-02 (×13): qty 1

## 2023-12-02 MED ORDER — PROSOURCE PLUS PO LIQD
30.0000 mL | Freq: Two times a day (BID) | ORAL | Status: DC
  Administered 2023-12-02 – 2023-12-06 (×6): 30 mL via ORAL
  Filled 2023-12-02 (×9): qty 30

## 2023-12-02 MED ORDER — INSULIN ASPART 100 UNIT/ML IJ SOLN: 20.0000 [IU] | Freq: Three times a day (TID) | INTRAMUSCULAR | Status: DC

## 2023-12-02 MED ORDER — LOSARTAN POTASSIUM 50 MG PO TABS
100.0000 mg | ORAL_TABLET | Freq: Every day | ORAL | Status: DC
  Administered 2023-12-03 – 2023-12-15 (×9): 100 mg via ORAL
  Filled 2023-12-02 (×12): qty 2

## 2023-12-02 MED ORDER — ATORVASTATIN CALCIUM 80 MG PO TABS: 80.0000 mg | ORAL_TABLET | Freq: Every day | ORAL | Status: DC

## 2023-12-02 MED ORDER — PENTAFLUOROPROP-TETRAFLUOROETH EX AERO: 1.0000 | INHALATION_SPRAY | CUTANEOUS | Status: DC | PRN

## 2023-12-02 MED ORDER — ASPIRIN 81 MG PO CHEW
81.0000 mg | CHEWABLE_TABLET | Freq: Every day | ORAL | Status: DC
  Administered 2023-12-02 – 2023-12-15 (×14): 81 mg via ORAL
  Filled 2023-12-02 (×14): qty 1

## 2023-12-02 MED ORDER — ATORVASTATIN CALCIUM 80 MG PO TABS
80.0000 mg | ORAL_TABLET | Freq: Every day | ORAL | Status: DC
  Administered 2023-12-02 – 2023-12-15 (×14): 80 mg via ORAL
  Filled 2023-12-02 (×14): qty 1

## 2023-12-02 MED ORDER — SEVELAMER CARBONATE 800 MG PO TABS: 800.0000 mg | ORAL_TABLET | Freq: Three times a day (TID) | ORAL | Status: DC

## 2023-12-02 MED ORDER — INSULIN GLARGINE-YFGN 100 UNIT/ML ~~LOC~~ SOLN: 60.0000 [IU] | Freq: Every day | SUBCUTANEOUS | Status: DC

## 2023-12-02 MED ORDER — HYDRALAZINE HCL 50 MG PO TABS
50.0000 mg | ORAL_TABLET | Freq: Two times a day (BID) | ORAL | Status: DC
  Administered 2023-12-02 – 2023-12-15 (×20): 50 mg via ORAL
  Filled 2023-12-02 (×25): qty 1

## 2023-12-02 MED ORDER — MECLIZINE HCL 25 MG PO TABS
25.0000 mg | ORAL_TABLET | Freq: Three times a day (TID) | ORAL | Status: DC | PRN
  Filled 2023-12-02: qty 1

## 2023-12-02 MED ORDER — PROSOURCE PLUS PO LIQD: 30.0000 mL | Freq: Two times a day (BID) | ORAL | Status: DC

## 2023-12-02 MED ORDER — RENA-VITE PO TABS
1.0000 | ORAL_TABLET | Freq: Every day | ORAL | Status: DC
  Administered 2023-12-02 – 2023-12-14 (×12): 1 via ORAL
  Filled 2023-12-02 (×13): qty 1

## 2023-12-02 MED ORDER — INSULIN ASPART 100 UNIT/ML IJ SOLN: 0.0000 [IU] | Freq: Every day | INTRAMUSCULAR | Status: DC

## 2023-12-02 MED ORDER — FUROSEMIDE 40 MG PO TABS
40.0000 mg | ORAL_TABLET | ORAL | Status: DC
  Filled 2023-12-02 (×3): qty 1

## 2023-12-02 NOTE — Progress Notes (Signed)
Received patient in bed to unit.    Informed consent signed and in chart.    TX duration: 3.5 hrs     Transported back to floor  Hand-off given to patient's nurse.   Access used:  lt arm avf Access issues: n/a  Total UF removed: 0 mls      Maple Hudson, RN Dialysis Unit

## 2023-12-02 NOTE — H&P (Signed)
Physical Medicine and Rehabilitation Admission H&P        Chief Complaint  Patient presents with   Dizziness  : HPI: Sharon Arias is a 62 year old right-handed female with history of CVA without residual deficit, obesity with BMI 39.50, OSA with CPAP, hypertension, anemia of chronic disease, diastolic congestive heart failure, end-stage renal disease with hemodialysis, diabetes mellitus with neuropathy as well as retinopathy and legally blind, CAD with CABG 2013 maintained on aspirin as well as Plavix.  Per chart review patient lives alone.  1 level home 2 steps to entry.  Independent with mobility without assistive device.  She does not drive.  Her groceries are delivered.  Presented to [facility] 11/26/2023 with progressive dizziness that started during a session of her hemodialysis as well as blurred vision with headache.  MRI/MRA of the head showed acute infarct in the brachium pontis on the left.  Severe narrowing in the ophthalmic segment of the right ICA.  Admission chemistries unremarkable except WBC 13,100, BUN 36, creatinine 3.52, hemoglobin A1c 8.1, urine drug screen negative.  Echocardiogram with ejection fraction of 55 to 60% no wall motion abnormalities.  Neurology follow-up patient currently remains on low-dose aspirin and Plavix for CVA prophylaxis.  Subcutaneous heparin added for DVT prophylaxis.  Tolerating a regular consistency diet.  Therapy evaluations completed due to patient decreased functional mobility was admitted for a comprehensive rehab program.   Review of Systems  Constitutional:  Negative for chills and fever.  HENT:  Negative for hearing loss.   Eyes:        Legally blind  Respiratory:  Negative for cough and wheezing.        Shortness of breath with heavy exertion  Cardiovascular:  Positive for leg swelling. Negative for chest pain and palpitations.  Gastrointestinal:  Positive for constipation. Negative for heartburn and nausea.  Genitourinary:  Negative for dysuria,  flank pain and hematuria.  Musculoskeletal:  Positive for joint pain and myalgias.  Skin:  Negative for rash.  Neurological:  Positive for dizziness, weakness and headaches.  Psychiatric/Behavioral:         Anxiety  All other systems reviewed and are negative.       Past Medical History:  Diagnosis Date   Anemia in chronic kidney disease 05/19/2016   Anxiety     Aortic atherosclerosis (HCC)     Blind     CAD (coronary artery disease)     Cardiac murmur     CHF (congestive heart failure) (HCC)      G2DD on 02/2020 TTE   Chicken pox     Chronic anticoagulation     CVA (cerebral vascular accident) (HCC)     DDD (degenerative disc disease), lumbar     Detached retina     Diabetic neuropathy (HCC)     Diabetic retinopathy (HCC)      legally blind   Dyspnea     ESRD needing dialysis (HCC)     Hx of CABG     Hyperlipidemia     Hypertension     NSTEMI (non-ST elevated myocardial infarction) (HCC) 06/2012   Obesity     Pneumonia     PONV (postoperative nausea and vomiting)     Sciatica of right side     Sleep apnea      CPAP NIGHTLY   T2DM (type 2 diabetes mellitus) (HCC)               Past Surgical History:  Procedure Laterality Date  A/V FISTULAGRAM Left 04/21/2021    Procedure: A/V FISTULAGRAM;  Surgeon: Renford Dills, MD;  Location: [facility] INVASIVE CV LAB;  Service: Cardiovascular;  Laterality: Left;   A/V FISTULAGRAM Left 11/02/2022    Procedure: A/V Fistulagram;  Surgeon: Renford Dills, MD;  Location: [facility] CV [facility];  Service: Cardiovascular;  Laterality: Left;   AV FISTULA PLACEMENT Left 02/06/2021    Procedure: ARTERIOVENOUS (AV) FISTULA CREATION (BRACHIAL CEPHALIC );  Surgeon: Renford Dills, MD;  Location: [facility] ORS;  Service: Vascular;  Laterality: Left;   COLONOSCOPY WITH PROPOFOL N/A 01/26/2016    Procedure: COLONOSCOPY WITH PROPOFOL;  Surgeon: Christena Deem, MD;  Location: [facility] ENDOSCOPY;  Service: Endoscopy;  Laterality: N/A;   COLONOSCOPY  WITH PROPOFOL N/A 01/27/2016    Procedure: COLONOSCOPY WITH PROPOFOL;  Surgeon: Christena Deem, MD;  Location: [facility] ENDOSCOPY;  Service: Endoscopy;  Laterality: N/A;   CORONARY ARTERY BYPASS GRAFT   2013   DIALYSIS/PERMA CATHETER INSERTION N/A 02/03/2021    Procedure: DIALYSIS/PERMA CATHETER INSERTION;  Surgeon: Renford Dills, MD;  Location: [facility];  Service: Cardiovascular;  Laterality: N/A;   DIALYSIS/PERMA CATHETER INSERTION N/A 06/16/2021    Procedure: DIALYSIS/PERMA CATHETER INSERTION;  Surgeon: Annice Needy, MD;  Location: [facility];  Service: Cardiovascular;  Laterality: N/A;   DIALYSIS/PERMA CATHETER REMOVAL N/A 09/22/2021    Procedure: DIALYSIS/PERMA CATHETER REMOVAL;  Surgeon: Renford Dills, MD;  Location: [facility] INV[facility] CV [facility];  Service: Cardiovascular;  Laterality: N/A;   INCISION AND DRAINAGE        chest abscess   INCISION AND DRAINAGE ABSCESS N/A 12/11/2018    Procedure: INCISION AND DRAINAGE PERINEAL;  Surgeon: Leafy Ro, MD;  Location: [facility] ORS;  Service: General;  Laterality: N/A;   RETINAL LASER PROCEDURE                 Family History  Problem Relation Age of Onset   Breast cancer Mother     Prostate cancer Father          we think mets to liver and bone        Social History:  reports that she has never smoked. She has never been exposed to tobacco smoke. She has never used smokeless tobacco. She reports that she does not drink alcohol and does not use drugs. Allergies:  Allergies       Allergies  Allergen Reactions   Ozempic (0.25 Or 0.5 Mg-Dose) [Semaglutide(0.25 Or 0.5mg -Dos)] Diarrhea and Nausea Only   Trulicity [Dulaglutide] Diarrhea and Nausea Only   Other        Anesthesia--nausea/vomiting               Facility-Administered Medications Prior to Admission  Medication Dose Route Frequency Provider Last Rate Last Admin   Chlorhexidine Gluconate Cloth 2 % PADS 6 each  6 each Topical Q0600 Georgiana Spinner, NP                 Medications Prior to Admission  Medication Sig Dispense Refill   allopurinol (ZYLOPRIM) 300 MG tablet Take 300 mg by mouth in the morning.       amLODipine (NORVASC) 5 MG tablet Take 5 mg by mouth at bedtime.       aspirin EC 81 MG tablet Take 81 mg by mouth in the morning.       clopidogrel (PLAVIX) 75 MG tablet Take 1 tablet (75 mg total) by mouth daily. (Patient taking differently: Take 75  mg by mouth in the morning.) 30 tablet 0   diphenhydramine-acetaminophen (TYLENOL PM) 25-500 MG TABS tablet Take 1-2 tablets by mouth at bedtime as needed.       furosemide (LASIX) 40 MG tablet Take 40 mg by mouth daily. Take one tablet daily on non-dialysis days.       hydrALAZINE (APRESOLINE) 50 MG tablet Take 50 mg by mouth in the morning and at bedtime.       Insulin Degludec (TRESIBA) 100 UNIT/ML SOLN Inject 75 mg into the skin at bedtime.       insulin lispro (HUMALOG) 100 UNIT/ML KwikPen Inject 20-52 Units into the skin See admin instructions. Pt. Uses 20 units/ml in the mornings, 42 units/ml in the lunch, and 52 units/ml at Dinner Sliding scale       lidocaine-prilocaine (EMLA) cream Apply 1 application topically as needed (prior to dialysis).       losartan (COZAAR) 100 MG tablet Take 100 mg by mouth at bedtime.       meclizine (ANTIVERT) 25 MG tablet Take 1 tablet (25 mg total) by mouth 3 (three) times daily as needed for dizziness. 30 tablet 0   metoprolol tartrate (LOPRESSOR) 50 MG tablet Take 50 mg by mouth 2 (two) times daily.        multivitamin (RENA-VIT) TABS tablet Take 1 tablet by mouth at bedtime.       ondansetron (ZOFRAN-ODT) 8 MG disintegrating tablet Take 1 tablet (8 mg total) by mouth every 8 (eight) hours as needed for nausea or vomiting. 20 tablet 0   ONETOUCH VERIO test strip         rosuvastatin (CRESTOR) 40 MG tablet Take 40 mg by mouth at bedtime.       sevelamer carbonate (RENVELA) 800 MG tablet Take 1,600 mg by mouth 3 (three) times daily.       Vitamin D,  Ergocalciferol, (DRISDOL) 1.25 MG (50000 UNIT) CAPS capsule Take 50,000 Units by mouth every 7 (seven) days.                Home: Home Living Family/patient expects to be discharged to:: Private residence Living Arrangements: Alone Available Help at Discharge: Available PRN/intermittently, Family Type of Home: House Home Access: Stairs to enter Secretary/administrator of Steps: 2 Entrance Stairs-Rails: Right Home Layout: One level Bathroom Shower/Tub: Health visitor: Handicapped height Home Equipment: Agricultural consultant (2 wheels), [employer] - single point, Wheelchair - manual, Shower seat, Grab bars - tub/shower Additional Comments: apartment has a ramped entrance  Lives With: Alone (husband died in 07/30/23)   Functional History: Prior Function Prior Level of Function : Independent/Modified Independent Mobility Comments: does not drive. uses transportation to get to medical appointments. gets groceries delivered. independent with mobility without assistive device ADLs Comments: IND with ADL's   Functional Status:  Mobility: Bed Mobility Overal bed mobility: Needs Assistance Bed Mobility: Supine to Sit Supine to sit: Contact guard, HOB elevated Sit to supine: Min assist, HOB elevated General bed mobility comments: patient using therapist hand to pull herself up with sitting upright. intermittent assistance for RLE support to return to bed Transfers Overall transfer level: Needs assistance Equipment used: Rolling walker (2 wheels) Transfers: Sit to/from Stand Sit to Stand: Min assist General transfer comment: steadying assistance provided. cues for hand placement. Ambulation/Gait Ambulation/Gait assistance: Min assist Gait Distance (Feet): 10 Feet Assistive device: Rolling walker (2 wheels) Gait Pattern/deviations: Step-to pattern, Decreased step length - left, Decreased stride length General Gait Details: short distance ambulation  performed with steadying  assistance required. patient does have incresed trunk sway intermittently. she did have both eyes opened during mobility efforts but feels less dizziness with right eye closed. encouraged patient to move head slowly during mobility efforts and to focus on object for decreased dizziness Gait velocity: decreased Pre-gait activities: weight shifting faciliation provided to right/left with sensation of falling. patient is able to advance the right leg forward without significant loss of balance. decreased coordination with L foot placement when taking a step with loss of balance. patient is able to take side steps to the right. Mod A +2 for dynamic pre-gait activity.   ADL: ADL Overall ADL's : Needs assistance/impaired Functional mobility during ADLs: Contact guard assist, Minimal assistance, +2 for physical assistance, Moderate assistance General ADL Comments: Pt completed MMT and demonstrated BUE AROM in all planes of movement, no noted deficits. Pt completed STS with Min Ax2 +2HHA. Pt utilized eye patch to help reduce dizziness during session. Pt completed static standing with Min A x2, tolerated ~7 minutes no LOB noted. Pt completed taking a step forward and backward with Mod A x2 +2HHA. Pt completed taking side steps towards the [facility] with CGA +2 HHA. Pt very motivated and willing to work with therapy.   Cognition: Cognition Overall Cognitive Status: Within Functional Limits for tasks assessed Arousal/Alertness: Awake/alert Orientation Level: Oriented X4 Memory: Appears intact Awareness: Appears intact Problem Solving: Appears intact Cognition Arousal: Alert Behavior During Therapy: WFL for tasks assessed/performed Overall Cognitive Status: Within Functional Limits for tasks assessed General Comments: patient is able to follow single step commands consistently   Physical Exam: Blood pressure (!) 116/52, pulse 76, temperature 98.6 F (37 C), resp. rate 18, height 5\' 7"  (1.702 m), weight 111.4  kg, SpO2 97%. Physical Exam   Constitutional: No apparent distress. Appropriate appearance for age. +obese  HENT: No JVD. Neck Supple. Trachea midline. Atraumatic, normocephalic. Eyes: PERRLA. EOMI - difficulty looking left d/t vertigo, w/o apparent nystagmus. Visual fields grossly intact.  Cardiovascular: RRR, no murmurs/rub/gallops. No Edema. Peripheral pulses 2+  Respiratory: CTAB. No rales, rhonchi, or wheezing. On RA.  Abdomen: + bowel sounds, normoactive. No distention or tenderness.  Skin: C/D/I. No apparent lesions. MSK:      No apparent deformity.       Neurologic exam:  Cognition: AAO to person, place, time and event.  Language: Mild dysarthria. Names 3/3 objects correctly.  Memory: Recalls 3/3 objects at 5 minutes. No apparent deficits  Insight: Good  insight into current condition.  Mood: Pleasant affect, appropriate mood.  Sensation: To light touch intact in BL UEs and LEs  Reflexes: 2+ in BL UE and LEs. Negative Hoffman's and babinski signs bilaterally.  CN:+ L facial droop Coordination:+ Ataxia LUE, LLE Spasticity: MAS 0 in all extremities.  Strength:                RUE: 5/5 SA, 5/5 EF, 5/5 EE, 5/5 WE, 5/5 FF, 5/5 FA                 LUE: 4/5 SA, 5-/5 EF, 5-/5 EE, 5-/5 WE, 5-/5 FF, 5-/5 FA                 RLE: 5/5 HF, 5/5 KE, 5/5 DF, 5/5 EHL, 5/5 PF                 LLE:  5-/5 HF, 5-/5 KE, 5/5 DF, 5/5 EHL, 5/5 PF         Lab Results  Last 48 Hours        Results for orders placed or performed during the hospital encounter of 11/26/23 (from the past 48 hours)  Glucose, capillary     Status: Abnormal    Collection Time: 11/29/23  7:49 AM  Result Value Ref Range    Glucose-Capillary 169 (H) 70 - 99 mg/dL      Comment: Glucose reference range applies only to samples taken after fasting for at least 8 hours.  Glucose, capillary     Status: Abnormal    Collection Time: 11/29/23 11:46 AM  Result Value Ref Range    Glucose-Capillary 141 (H) 70 - 99 mg/dL      Comment:  Glucose reference range applies only to samples taken after fasting for at least 8 hours.  Glucose, capillary     Status: Abnormal    Collection Time: 11/29/23  4:53 PM  Result Value Ref Range    Glucose-Capillary 121 (H) 70 - 99 mg/dL      Comment: Glucose reference range applies only to samples taken after fasting for at least 8 hours.  MRSA Next Gen by PCR, Nasal     Status: None    Collection Time: 11/29/23  9:13 PM    Specimen: Nasal Mucosa; Nasal Swab  Result Value Ref Range    MRSA by PCR Next Gen NOT DETECTED NOT DETECTED      Comment: (NOTE) The GeneXpert MRSA Assay (FDA approved for NASAL specimens only), is one component of a comprehensive MRSA colonization surveillance program. It is not intended to diagnose MRSA infection nor to guide or monitor treatment for MRSA infections. Test performance is not FDA approved in patients less than 18 years old. Performed at [facility], [address] [place], [place] [address]    Glucose, capillary     Status: Abnormal    Collection Time: 11/30/23  6:31 AM  Result Value Ref Range    Glucose-Capillary 142 (H) 70 - 99 mg/dL      Comment: Glucose reference range applies only to samples taken after fasting for at least 8 hours.  CBC     Status: Abnormal    Collection Time: 11/30/23 10:44 AM  Result Value Ref Range    WBC 11.2 (H) 4.0 - 10.5 K/uL    RBC 3.81 (L) 3.87 - 5.11 MIL/uL    Hemoglobin 11.6 (L) 12.0 - 15.0 g/dL    HCT 60.4 (L) 54.0 - 46.0 %    MCV 89.2 80.0 - 100.0 fL    MCH 30.4 26.0 - 34.0 pg    MCHC 34.1 30.0 - 36.0 g/dL    RDW 98.1 19.1 - 47.8 %    Platelets 247 150 - 400 K/uL    nRBC 0.0 0.0 - 0.2 %      Comment: Performed at [facility], [address] [place], [place] [address]  Renal function panel     Status: Abnormal    Collection Time: 11/30/23 10:44 AM  Result Value Ref Range    Sodium 138 135 - 145 mmol/L    Potassium 4.0 3.5 - 5.1 mmol/L    Chloride 104 98 - 111 mmol/L    CO2 21  (L) 22 - 32 mmol/L    Glucose, Bld 157 (H) 70 - 99 mg/dL      Comment: Glucose reference range applies only to samples taken after fasting for at least 8 hours.    BUN 62 (H) 8 - 23 mg/dL  Creatinine, Ser 4.63 (H) 0.44 - 1.00 mg/dL    Calcium 9.3 8.9 - 16.1 mg/dL    Phosphorus 5.9 (H) 2.5 - 4.6 mg/dL    Albumin 3.6 3.5 - 5.0 g/dL    GFR, Estimated 10 (L) >60 mL/min      Comment: (NOTE) Calculated using the CKD-EPI Creatinine Equation (2021)      Anion gap 13 5 - 15      Comment: Performed at [facility], [address] [place], [place] [address]  Glucose, capillary     Status: Abnormal    Collection Time: 11/30/23  5:04 PM  Result Value Ref Range    Glucose-Capillary 263 (H) 70 - 99 mg/dL      Comment: Glucose reference range applies only to samples taken after fasting for at least 8 hours.  Glucose, capillary     Status: Abnormal    Collection Time: 11/30/23  7:55 PM  Result Value Ref Range    Glucose-Capillary 356 (H) 70 - 99 mg/dL      Comment: Glucose reference range applies only to samples taken after fasting for at least 8 hours.      Imaging Results (Last 48 hours)  No results found.         Blood pressure (!) 116/52, pulse 76, temperature 98.6 F (37 C), resp. rate 18, height 5\' 7"  (1.702 m), weight 111.4 kg, SpO2 97%.   Medical Problem List and Plan: 1. Functional deficits secondary to ischemic infarct left brachium pontis secondary to small vessel disease             -patient may  shower             -ELOS/Goals: 10-14 days, supervision PT, supervision OT, modified independent SLP    - Stable for admission to IRF  2.  Antithrombotics: -DVT/anticoagulation:  Pharmaceutical: Heparin             -antiplatelet therapy: Aspirin 81 mg daily and Plavix 75 mg daily as prior to admission 3. Pain Management: Tylenol as needed 4. Mood/Behavior/Sleep: Provide emotional support             -antipsychotic agents: N/A 5. Neuropsych/cognition: This patient is  capable of making decisions on her own behalf. 6. Skin/Wound Care: Routine skin checks 7. Fluids/Electrolytes/Nutrition: Routine in and outs with follow-up chemistries 8.  End-stage renal disease.  Continue hemodialysis as directed 9.  CAD with CABG 2013.  No chest pain or shortness of breath.  Continue aspirin and Plavix 10.  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  Hemoglobin A1c 8.1. Novolog 20 units 3 times daily with meals. Semglee 60 units nightly.  Check blood sugars before meals and at bedtime 11.  Hypertension.  Cozaar 100 mg daily, hydralazine 50 mg twice daily, Norvasc 5 mg daily, Lopressor 50 mg twice daily.  Monitor with increased mobility 12.  Hyperlipidemia.  Lipitor 13.  Diastolic congestive heart failure.  Lasix 40 mg Tuesday Thursday Saturday Sunday.  Monitor for any signs of fluid overload 14.  History of gout.  Zyloprim 300 mg daily.  Monitor for any gout flares 15.Obesity.  BMI 39.50.  Dietary follow-up 16.  Anemia of chronic disease.  Follow-up CBC. Mcarthur Rossetti Angiulli, PA-C 12/01/2023  I have examined the patient independently and edited the note for HPI, ROS, exam, assessment, and plan as appropriate. I am in agreement with the above recommendations.   Angelina Sheriff, DO 12/02/2023

## 2023-12-02 NOTE — Progress Notes (Signed)
Angelina Sheriff, DO  Physician Physical Medicine and Rehabilitation   PMR Pre-admission    Signed   Date of Service: 12/02/2023 10:08 AM  Related encounter: ED to Hosp-Admission (Discharged) from 11/26/2023 in [facility] 1C MEDICAL TELEMETRY   Signed     Expand All Collapse All  PMR Admission Coordinator Pre-Admission Assessment   Patient: Sharon Arias is an 62 y.o., female MRN: [id number] DOB: [birth year] Height: 5\' 7"  (170.2 cm) Weight: 110.8 kg   Insurance Information HMO: yes    PPO:      PCP:      IPA:      80/20:      OTHER:  PRIMARY: [employer] Medicare      Policy#: [id number]      Subscriber: pt CM Name: Marilynne Halsted      Phone#: [phone]     Fax#: [phone] Pre-Cert#: [id number] auth for CIR provided by Marilynne Halsted with [facility] Medicare with updates to fax listed above on 12/18      Employer:  Benefits:  Phone #: [phone]     Name:  Eff. Date: 12/20/22     Deduct: $0      Out of Pocket Max: $3600 (met)      Life Max:  CIR: $295/day for days 1-5      SNF: 20 full days Outpatient:      Co-Pay: $20/visit Home Health: 100%      Co-Pay:  DME: 80%     Co-Pay: 20% Providers:  SECONDARY:       Policy#:      Phone#:    Artist:       Phone#:    The Engineer, materials Information Summary" for patients in Inpatient Rehabilitation Facilities with attached "Privacy Act Statement-Health Care Records" was provided and verbally reviewed with: Patient and Family   Emergency Contact Information Contact Information       Name Relation Home Work Mobile    driver,lindsay Daughter [phone]   [phone]         Other Contacts   None on File        Current Medical History  Patient Admitting Diagnosis: L pontine CVA    History of Present Illness: Pt is a 62 y/o female with PMH of ESRD on HD MWF, HTN, HLD, DM with neuropathy and retinopathy, OSA, and CAD, CVA admitted to [facility] on 12/7 with c/o dizziness and vomiting.  She took meclizine at home  with no effect.  In ED BP 175/59, WBC 13.1, hgb 11.7, creatinine 3.52, anion gap 17, cbg 374.  NIHSS 5.  MRI/MRA of the head showed acute infarct in the brachium pontis on the left. Severe narrowing in the ophthalmic segment of the right ICA. Echocardiogram with ejection fraction of 55 to 60% no wall motion abnormalities. Neurology follow-up patient currently remains on low-dose aspirin and Plavix for CVA prophylaxis. Subcutaneous heparin added for DVT prophylaxis. Tolerating a regular consistency diet. Therapy evaluations completed and pt was recommended for a comprehensive rehab program.    Complete NIHSS TOTAL: 2   Patient's medical record from [facility] has been reviewed by the rehabilitation admission coordinator and physician.   Past Medical History      Past Medical History:  Diagnosis Date   Anemia in chronic kidney disease 05/19/2016   Anxiety     Aortic atherosclerosis (HCC)     Blind     CAD (coronary artery disease)     Cardiac murmur     CHF (  congestive heart failure) (HCC)      G2DD on 02/2020 TTE   Chicken pox     Chronic anticoagulation     CVA (cerebral vascular accident) (HCC)     DDD (degenerative disc disease), lumbar     Detached retina     Diabetic neuropathy (HCC)     Diabetic retinopathy (HCC)      legally blind   Dyspnea     ESRD needing dialysis (HCC)     Hx of CABG     Hyperlipidemia     Hypertension     NSTEMI (non-ST elevated myocardial infarction) (HCC) 06/2012   Obesity     Pneumonia     PONV (postoperative nausea and vomiting)     Sciatica of right side     Sleep apnea      CPAP NIGHTLY   T2DM (type 2 diabetes mellitus) (HCC)            Has the patient had major surgery during 100 days prior to admission? No   Family History   family history includes Breast cancer in her mother; Prostate cancer in her father.   Current Medications  Current Medications    Current Facility-Administered Medications:     stroke: early stages of recovery book,  , Does not apply, Once, Lurene Shadow, MD   (feeding supplement) PROSource Plus liquid 30 mL, 30 mL, Oral, BID BM, Lurene Shadow, MD, 30 mL at 11/29/23 1410   acetaminophen (TYLENOL) tablet 650 mg, 650 mg, Oral, Q4H PRN **OR** acetaminophen (TYLENOL) 160 MG/5ML solution 650 mg, 650 mg, Per Tube, Q4H PRN **OR** acetaminophen (TYLENOL) suppository 650 mg, 650 mg, Rectal, Q4H PRN, Lurene Shadow, MD   allopurinol (ZYLOPRIM) tablet 300 mg, 300 mg, Oral, q AM, Lurene Shadow, MD, 300 mg at 12/02/23 0611   amLODipine (NORVASC) tablet 5 mg, 5 mg, Oral, QHS, Lurene Shadow, MD, 5 mg at 12/01/23 2141   aspirin chewable tablet 81 mg, 81 mg, Oral, Daily, Lurene Shadow, MD, 81 mg at 12/01/23 0918   atorvastatin (LIPITOR) tablet 80 mg, 80 mg, Oral, Daily, Lurene Shadow, MD, 80 mg at 12/01/23 1610   Chlorhexidine Gluconate Cloth 2 % PADS 6 each, 6 each, Topical, Q0600, Lurene Shadow, MD, 6 each at 12/02/23 [phone]   clopidogrel (PLAVIX) tablet 75 mg, 75 mg, Oral, Daily, Lurene Shadow, MD, 75 mg at 12/01/23 5409   furosemide (LASIX) tablet 40 mg, 40 mg, Oral, Q T,Th,S,Su, Lurene Shadow, MD, 40 mg at 11/29/23 0813   heparin injection 1,000 Units, 1,000 Units, Intracatheter, PRN, Wendee Beavers, NP   heparin injection 5,000 Units, 5,000 Units, Subcutaneous, Q8H, Lurene Shadow, MD, 5,000 Units at 12/02/23 8119   hydrALAZINE (APRESOLINE) tablet 50 mg, 50 mg, Oral, BID, Lurene Shadow, MD, 50 mg at 12/01/23 2142   insulin aspart (novoLOG) injection 0-5 Units, 0-5 Units, Subcutaneous, QHS, Sreeram, Narendranath, MD   insulin aspart (novoLOG) injection 0-9 Units, 0-9 Units, Subcutaneous, TID WC, Sreeram, Narendranath, MD, 1 Units at 12/01/23 1703   insulin aspart (novoLOG) injection 20 Units, 20 Units, Subcutaneous, TID WC, Lurene Shadow, MD, 20 Units at 12/01/23 1703   insulin glargine-yfgn (SEMGLEE) injection 60 Units, 60 Units, Subcutaneous, QHS, Sreeram, Narendranath, MD, 60 Units at 12/01/23 2140    lidocaine-prilocaine (EMLA) cream 1 Application, 1 Application, Topical, PRN, Wendee Beavers, NP   losartan (COZAAR) tablet 100 mg, 100 mg, Oral, Daily, Lurene Shadow, MD, 100 mg at 12/01/23 0919   meclizine (ANTIVERT) tablet 25 mg, 25 mg, Oral, TID  PRN, Lurene Shadow, MD, 25 mg at 11/29/23 0813   metoprolol tartrate (LOPRESSOR) tablet 50 mg, 50 mg, Oral, BID, Lurene Shadow, MD, 50 mg at 12/01/23 2141   multivitamin (RENA-VIT) tablet 1 tablet, 1 tablet, Oral, QHS, Lurene Shadow, MD, 1 tablet at 12/01/23 2141   ondansetron (ZOFRAN) injection 4 mg, 4 mg, Intravenous, Once, Lurene Shadow, MD   Oral care mouth rinse, 15 mL, Mouth Rinse, PRN, Lurene Shadow, MD   pentafluoroprop-tetrafluoroeth (GEBAUERS) aerosol 1 Application, 1 Application, Topical, PRN, Wendee Beavers, NP   sevelamer carbonate (RENVELA) tablet 800 mg, 800 mg, Oral, TID AC, Breeze, Shantelle, NP, 800 mg at 11/30/23 1724     Patients Current Diet:  Diet Order                  Diet Carb Modified Fluid consistency: Thin  Diet effective now                         Precautions / Restrictions Precautions Precautions: Fall Precaution Comments: baseline vision issues, spinning sensation with mobility Restrictions Weight Bearing Restrictions Per Provider Order: No    Has the patient had 2 or more falls or a fall with injury in the past year? Yes   Prior Activity Level Limited Community (1-2x/wk): mod I at baseline, does not drive 2/2 legally blind, has groceries delivered and uses [facility] for transport to appointments   Prior Functional Level Self Care: Did the patient need help bathing, dressing, using the toilet or eating? Independent   Indoor Mobility: Did the patient need assistance with walking from room to room (with or without device)? Independent   Stairs: Did the patient need assistance with internal or external stairs (with or without device)? Independent   Functional Cognition: Did the patient need  help planning regular tasks such as shopping or remembering to take medications? Independent   Patient Information Are you of Hispanic, Latino/a,or Spanish origin?: A. No, not of Hispanic, Latino/a, or Spanish origin What is your race?: A. White Do you need or want an interpreter to communicate with a doctor or health care staff?: 0. No   Patient's Response To:  Health Literacy and Transportation Is the patient able to respond to health literacy and transportation needs?: Yes Health Literacy - How often do you need to have someone help you when you read instructions, pamphlets, or other written material from your doctor or pharmacy?: Never In the past 12 months, has lack of transportation kept you from medical appointments or from getting medications?: No In the past 12 months, has lack of transportation kept you from meetings, work, or from getting things needed for daily living?: No   Home Assistive Devices / Equipment Home Equipment: Agricultural consultant (2 wheels), [employer] - single point, Wheelchair - manual, Information systems manager, Grab bars - tub/shower   Prior Device Use: Indicate devices/aids used by the patient prior to current illness, exacerbation or injury? None of the above   Current Functional Level Cognition   Arousal/Alertness: Awake/alert Overall Cognitive Status: Within Functional Limits for tasks assessed Orientation Level: Oriented X4 General Comments: patient is able to follow single step commands consistently Memory: Appears intact Awareness: Appears intact Problem Solving: Appears intact    Extremity Assessment (includes Sensation/Coordination)   Upper Extremity Assessment: LUE deficits/detail LUE Deficits / Details: Noted pt's LUE fine and gross coordination to be slow  Lower Extremity Assessment: Defer to PT evaluation RLE Deficits / Details: 5/5 with MMT, dorsiflexion, plantarflexion, hip add/abd,  knee extension. however patient feels generalized weakness in the legs with  standing RLE Sensation: history of peripheral neuropathy LLE Deficits / Details: 5/5 with MMT, dorsiflexion, plantarflexion, hip add/abd, knee extension. however patient feels generalized weakness in the legs with standing LLE Sensation: history of peripheral neuropathy LLE Coordination: decreased gross motor     ADLs   Overall ADL's : Needs assistance/impaired Functional mobility during ADLs: Contact guard assist, Minimal assistance, +2 for physical assistance, Moderate assistance General ADL Comments: Pt requested brushing teeth and completed grooming tasks (oral care and washing face) sitting at the EOB with supervision. Pt completed fine motor control task picking washers up from table top and placing on dowel, noted patients L hand requiring increased time. Pt completed fine motor control task picking washers up from table top and storing in palm, noted L hand requiring increased time.     Mobility   Overal bed mobility: Needs Assistance Bed Mobility: Supine to Sit Supine to sit: Contact guard, HOB elevated Sit to supine: Contact guard assist, Used rails General bed mobility comments: increased time required with increased dizziness reported with mobility     Transfers   Overall transfer level: Needs assistance Equipment used: Rolling walker (2 wheels) Transfers: Sit to/from Stand Sit to Stand: Min assist General transfer comment: lifting assistance required for standing from bed and from toilet. patient required cues for safety and positioning with sitting on toilet due to impaired vision and spatial awareness     Ambulation / Gait / Stairs / Wheelchair Mobility   Ambulation/Gait Ambulation/Gait assistance: Editor, commissioning (Feet): 30 Feet Assistive device: Rolling walker (2 wheels) Gait Pattern/deviations: Staggering right, Decreased stride length, Step-through pattern General Gait Details: steadying assistance required for ambulation. mild staggering to the right with  cues for correction. patient intermittently closed right eye with improved spinning sensation. education provided on strategies to help with dizziness Gait velocity: decreased Pre-gait activities: weight shifting faciliation provided to right/left with sensation of falling. patient is able to advance the right leg forward without significant loss of balance. decreased coordination with L foot placement when taking a step with loss of balance. patient is able to take side steps to the right. Mod A +2 for dynamic pre-gait activity.     Posture / Balance Balance Overall balance assessment: Needs assistance Sitting-balance support: Feet supported Sitting balance-Leahy Scale: Fair Postural control: Posterior lean Standing balance support: Bilateral upper extremity supported Standing balance-Leahy Scale: Poor Standing balance comment: external support required from rolling walker     Special needs/care consideration CPAP, Diabetic management yes, and Special service needs visually impaired    Previous Home Environment (from acute therapy documentation) Living Arrangements: Alone  Lives With: Alone (husband died in 25-Jul-2023) Available Help at Discharge: Available PRN/intermittently, Family Type of Home: House Home Layout: One level Home Access: Stairs to enter Entrance Stairs-Rails: Right Entrance Stairs-Number of Steps: 2 Bathroom Shower/Tub: Health visitor: Handicapped height Home Care Services: No Additional Comments: apartment has a ramped entrance   Discharge Living Setting Plans for Discharge Living Setting: Patient's home, Lives with (comment) (daughter and her family to stay with pt at discharge) Type of Home at Discharge: House (also has an attached apartment which is handicap accessible) Discharge Home Layout: One level Discharge Home Access: Ramped entrance Discharge Bathroom Shower/Tub: Walk-in shower Discharge Bathroom Toilet: Handicapped height Discharge  Bathroom Accessibility: Yes How Accessible: Accessible via walker, Accessible via wheelchair (in apartment, the main house bathroom is NOT accessible) Does the patient  have any problems obtaining your medications?: No   Social/Family/Support Systems Anticipated Caregiver: daughter Areta Haber Anticipated Caregiver's Contact Information: [phone] Ability/Limitations of Caregiver: none stated Caregiver Availability: 24/7 Discharge Plan Discussed with Primary Caregiver: Yes Is Caregiver In Agreement with Plan?: Yes Does Caregiver/Family have Issues with Lodging/Transportation while Pt is in Rehab?: No   Goals Patient/Family Goal for Rehab: PT/OT/SLP supervision to mod I Expected length of stay: 10-14 days Additional Information: Discharge plan: discharge back to pt's house, her daughter Mardella Layman) can provide 24/7 supervision if needed and is planning to stay with pt Pt/Family Agrees to Admission and willing to participate: Yes Program Orientation Provided & Reviewed with Pt/Caregiver Including Roles  & Responsibilities: Yes   Decrease burden of Care through IP rehab admission: n/a   Possible need for SNF placement upon discharge: No.  Plan for discharge to pt's home and daughter will stay with her.  She can provide 24/7 supervision.    Patient Condition: I have reviewed medical records from [facility], spoken with  [facility] team , and patient and daughter. I discussed via phone for inpatient rehabilitation assessment.  Patient will benefit from ongoing PT, OT, and SLP, can actively participate in 3 hours of therapy a day 5 days of the week, and can make measurable gains during the admission.  Patient will also benefit from the coordinated team approach during an Inpatient Acute Rehabilitation admission.  The patient will receive intensive therapy as well as Rehabilitation physician, nursing, social worker, and care management interventions.  Due to safety, skin/wound care, disease management,  medication administration, pain management, and patient education the patient requires 24 hour a day rehabilitation nursing.  The patient is currently min assist with mobility and basic ADLs.  Discharge setting and therapy post discharge at home with home health is anticipated.  Patient has agreed to participate in the Acute Inpatient Rehabilitation Program and will admit today.   Preadmission Screen Completed By:  Stephania Fragmin, PT, DPT 12/02/2023 10:08 AM ______________________________________________________________________   Discussed status with Dr. Shearon Stalls on 12/02/23  at 10:08 AM  and received approval for admission today.   Admission Coordinator:  Stephania Fragmin, PT, DPT time 10:08 AM Dorna Bloom 12/02/23     Assessment/Plan: Diagnosis: Does the need for close, 24 hr/day Medical supervision in concert with the patient's rehab needs make it unreasonable for this patient to be served in a less intensive setting? Yes Co-Morbidities requiring supervision/potential complications: Hyperglycemia, ESRD on HD, anemia, diastolic CHF, Hypertension, bradycardia Due to safety, skin/wound care, disease management, medication administration, pain management, and patient education, does the patient require 24 hr/day rehab nursing? Yes Does the patient require coordinated care of a physician, rehab nurse, PT, OT, and SLP to address physical and functional deficits in the context of the above medical diagnosis(es)? Yes Addressing deficits in the following areas: balance, endurance, locomotion, strength, transferring, bowel/bladder control, bathing, dressing, feeding, grooming, toileting, and speech Can the patient actively participate in an intensive therapy program of at least 3 hrs of therapy 5 days a week? Yes The potential for patient to make measurable gains while on inpatient rehab is good Anticipated functional outcomes upon discharge from inpatient rehab: supervision PT, supervision OT, modified  independent SLP Estimated rehab length of stay to reach the above functional goals is: 10-14 days Anticipated discharge destination: Home 10. Overall Rehab/Functional Prognosis: good     MD Signature:   Angelina Sheriff, DO 12/02/2023            Revision History

## 2023-12-02 NOTE — Progress Notes (Signed)
Inpatient Rehab Admissions Coordinator:   I have a bed available for this patient to admit to CIR today.  I've left a message on her phone and spoken to her daughter to confirm they wish to proceed.  Dr. Clide Dales in agreement for transfer today and [facility] aware.  Pt currently in HD with tentative completion time of 11-1229.  I will arrange for [facility] pickup between 130-2 once I have a room assignment.  Rn can call for report at [phone].   Estill Dooms, PT, DPT Admissions Coordinator [phone] 12/02/23  10:09 AM

## 2023-12-02 NOTE — Care Management Important Message (Signed)
Important Message  Patient Details  Name: Sharon Arias MRN: [id number] Date of Birth: Dec 26, 1960   Important Message Given:  Yes - Medicare IM     [name] 12/02/2023, 9:43 AM

## 2023-12-02 NOTE — Progress Notes (Signed)
[facility]  ROUNDING NOTE   Subjective:   Ms. MAITE NUTILE was admitted to [facility] on 11/26/2023 for Morbid obesity (HCC) [E66.01] Dizziness [R42] Acute ischemic stroke [facility]) [I63.9] Type 2 diabetes mellitus with hyperglycemia, with long-term current use of insulin (HCC) [E11.65, Z79.4]  Update Patient seen and evaluated during dialysis   HEMODIALYSIS FLOWSHEET:  Blood Flow Rate (mL/min): 350 mL/min Arterial Pressure (mmHg): -219.19 mmHg Venous Pressure (mmHg): 249.31 mmHg TMP (mmHg): 5.66 mmHg Ultrafiltration Rate (mL/min): 201 mL/min Dialysate Flow Rate (mL/min): 300 ml/min  No complaints to offer today Possible discharge after treatment  Objective:  Vital signs in last 24 hours:  Temp:  [97.8 F (36.6 C)-98.2 F (36.8 C)] 98 F (36.7 C) (12/13 0823) Pulse Rate:  [57-64] 59 (12/13 1130) Resp:  [10-18] 11 (12/13 1130) BP: (137-154)/(44-107) 137/46 (12/13 1130) SpO2:  [96 %-100 %] 98 % (12/13 1130) FiO2 (%):  [21 %] 21 % (12/13 0007) Weight:  [110.8 kg] 110.8 kg (12/13 0823)  Weight change:  Filed Weights   11/30/23 1024 11/30/23 1409 12/02/23 0823  Weight: 111.2 kg 111.4 kg 110.8 kg    Intake/Output: I/O last 3 completed shifts: In: 50 [P.O.:50] Out: -    Intake/Output this shift:  No intake/output data recorded.  Physical Exam: General: NAD  Head: Normocephalic, atraumatic. Moist oral mucosal membranes  Eyes: Eyes closed  Lungs:  Clear to auscultation, normal effort  Heart: Regular rate and rhythm  Abdomen:  Soft, nontender  Extremities:  no peripheral edema.  Neurologic: Alert and oriented, moving all four extremities  Skin: No lesions  Access: Left AVF    Basic Metabolic Panel: Recent Labs  Lab 11/26/23 1228 11/28/23 0900 11/30/23 1044  NA 138 138 138  K 3.7 3.8 4.0  CL 97* 104 104  CO2 24 22 21*  GLUCOSE 297* 164* 157*  BUN 36* 51* 62*  CREATININE 3.52* 4.11* 4.63*  CALCIUM 9.6 9.4 9.3  PHOS  --  4.9* 5.9*    Liver  Function Tests: Recent Labs  Lab 11/28/23 0900 11/30/23 1044  ALBUMIN 3.7 3.6   No results for input(s): "LIPASE", "AMYLASE" in the last 168 hours. No results for input(s): "AMMONIA" in the last 168 hours.  CBC: Recent Labs  Lab 11/26/23 1228 11/28/23 0900 11/30/23 1044  WBC 13.1* 9.8 11.2*  HGB 11.7* 11.4* 11.6*  HCT 34.8* 34.0* 34.0*  MCV 91.6 90.7 89.2  PLT 268 259 247    Cardiac Enzymes: No results for input(s): "CKTOTAL", "CKMB", "CKMBINDEX", "TROPONINI" in the last 168 hours.  BNP: Invalid input(s): "POCBNP"  CBG: Recent Labs  Lab 11/30/23 1955 12/01/23 0815 12/01/23 1223 12/01/23 1554 12/01/23 2133  GLUCAP 356* 228* 220* 136* 92    Microbiology: Results for orders placed or performed during the hospital encounter of 11/26/23  MRSA Next Gen by PCR, Nasal     Status: None   Collection Time: 11/29/23  9:13 PM   Specimen: Nasal Mucosa; Nasal Swab  Result Value Ref Range Status   MRSA by PCR Next Gen NOT DETECTED NOT DETECTED Final    Comment: (NOTE) The GeneXpert MRSA Assay (FDA approved for NASAL specimens only), is one component of a comprehensive MRSA colonization surveillance program. It is not intended to diagnose MRSA infection nor to guide or monitor treatment for MRSA infections. Test performance is not FDA approved in patients less than 20 years old. Performed at [facility], [address] [place], [place] [address]     Coagulation Studies:  No results for input(s): "LABPROT", "INR" in the last 72 hours.   Urinalysis: No results for input(s): "COLORURINE", "LABSPEC", "PHURINE", "GLUCOSEU", "HGBUR", "BILIRUBINUR", "KETONESUR", "PROTEINUR", "UROBILINOGEN", "NITRITE", "LEUKOCYTESUR" in the last 72 hours.  Invalid input(s): "APPERANCEUR"    Imaging: No results found.   Medications:       stroke: early stages of recovery book   Does not apply Once   (feeding supplement) PROSource Plus  30 mL Oral BID BM   allopurinol   300 mg Oral q AM   amLODipine  5 mg Oral QHS   aspirin  81 mg Oral Daily   atorvastatin  80 mg Oral Daily   Chlorhexidine Gluconate Cloth  6 each Topical Q0600   clopidogrel  75 mg Oral Daily   furosemide  40 mg Oral Q T,Th,S,Su   heparin  5,000 Units Subcutaneous Q8H   hydrALAZINE  50 mg Oral BID   insulin aspart  0-5 Units Subcutaneous QHS   insulin aspart  0-9 Units Subcutaneous TID WC   insulin aspart  20 Units Subcutaneous TID WC   insulin glargine-yfgn  60 Units Subcutaneous QHS   losartan  100 mg Oral Daily   metoprolol tartrate  50 mg Oral BID   multivitamin  1 tablet Oral QHS   ondansetron (ZOFRAN) IV  4 mg Intravenous Once   sevelamer carbonate  800 mg Oral TID AC   acetaminophen **OR** acetaminophen (TYLENOL) oral liquid 160 mg/5 mL **OR** acetaminophen, heparin, lidocaine-prilocaine, meclizine, mouth rinse, pentafluoroprop-tetrafluoroeth  Assessment/ Plan:  Ms. TOMORROW AMORIM is a 62 y.o.  female with end stage renal disease on hemodialysis, hypertension, hyperlipidemia, diabetes mellitus type II, coronary artery disease, CVA, diabetes neuropathy, diabetes retinopathy, sleep apnea who is admitted to [facility] on 11/26/2023 for Morbid obesity (HCC) [E66.01] Dizziness [R42] Acute ischemic stroke (HCC) [I63.9] Type 2 diabetes mellitus with hyperglycemia, with long-term current use of insulin (HCC) [E11.65, Z79.4]  [facility] left AVF 115kg  End Stage Renal Disease:  -Patient receiving dialysis today. -Due to poor oral intake, UF 0 -Next treatment scheduled for Monday. - If stable patient cleared to discharge after treatment.  Hypertension: with acute CVA.  - Allow for permissive hypertension.  - Home regimen of amlodipine, furosemide, losartan, and metoprolol.  -Neurology to follow-up outpatient. -Blood pressure 150/50 during dialysis.  Anemia with chronic kidney disease: hemoglobin 11.6 on 11/30/2023.[name]  Secondary Hyperparathyroidism -Will continue to  monitor bone minerals. - Continue sevelamer with meals.    LOS: 4 Shahzad Thomann 12/13/202411:57 AM

## 2023-12-02 NOTE — Progress Notes (Signed)
Inpatient Rehabilitation Center Individual Statement of Services  Patient Name:  Sharon Arias  Date:  12/02/2023  Welcome to the Inpatient Rehabilitation Center.  Our goal is to provide you with an individualized program based on your diagnosis and situation, designed to meet your specific needs.  With this comprehensive rehabilitation program, you will be expected to participate in at least 3 hours of rehabilitation therapies Monday-Friday, with modified therapy programming on the weekends.  Your rehabilitation program will include the following services:  Physical Therapy (PT), Occupational Therapy (OT), Speech Therapy (ST), 24 hour per day rehabilitation nursing, Therapeutic Recreaction (TR), Neuropsychology, Care Coordinator, Rehabilitation Medicine, Nutrition Services, Pharmacy Services, and Other  Weekly team conferences will be held on Wednesdays to discuss your progress.  Your Inpatient Rehabilitation Care Coordinator will talk with you frequently to get your input and to update you on team discussions.  Team conferences with you and your family in attendance may also be held.  Expected length of stay: 10-14 Days  Overall anticipated outcome: supervision to mod I   Depending on your progress and recovery, your program may change. Your Inpatient Rehabilitation Care Coordinator will coordinate services and will keep you informed of any changes. Your Inpatient Rehabilitation Care Coordinator's name and contact numbers are listed  below.  The following services may also be recommended but are not provided by the Inpatient Rehabilitation Center:   Home Health Rehabiltiation Services Outpatient Rehabilitation Services    Arrangements will be made to provide these services after discharge if needed.  Arrangements include referral to agencies that provide these services.  Your insurance has been verified to be:   [facility] Medicare Your primary doctor is:  Aram Beecham, MD  Pertinent  information will be shared with your doctor and your insurance company.  Inpatient Rehabilitation Care Coordinator:  Lavera Guise, [place] [phone] or [phone]  Information discussed with and copy given to patient by: Andria Rhein, 12/02/2023, 2:36 PM

## 2023-12-02 NOTE — Progress Notes (Signed)
Inpatient Rehabilitation Admission Medication Review by a Pharmacist  A complete drug regimen review was completed for this patient to identify any potential clinically significant medication issues.  High Risk Drug Classes Is patient taking? Indication by Medication  Antipsychotic No   Anticoagulant Yes SQ heparin - VTE prophylaxis  Antibiotic No   Opioid No   Antiplatelet Yes Aspirin 81 mg, clopidogrel - CVA prophylaxis and hx CAD with prior CABG  Hypoglycemics/insulin Yes Insulin glargine (Semglee), SSI - DM Type 2  Vasoactive Medication Yes Amlodipine, hydralazine, losartan, metoprolol - hypertension, diastolic heart failure Furosemide (non-HD days) - fluid balance  Chemotherapy No   Other Yes Allopurinol - gout prophylaxis Atorvastatin - hyperlipidemia Renal vitamin - supplement Sevelamer - hyperphosphatemia of ESRD  PRNs: Acetaminophen - mild pain or temp >99.52F Meclizine - dizziness     Type of Medication Issue Identified Description of Issue Recommendation(s)  Drug Interaction(s) (clinically significant)     Duplicate Therapy     Allergy     No Medication Administration End Date     Incorrect Dose     Additional Drug Therapy Needed     Significant med changes from prior encounter (inform family/care partners about these prior to discharge). Rosuvastatin changed to atorvastatin   Off Tresiba > inpatient substitution Semglee  Off weekly Vitamin D. Last level in Care Everywhere 34.9 (low normal) 05/05/22.  Communicate changes with patient/family prior to discharge.  Resume Tresiba at discharge.   Consider checking Vitamin D level while in the hospital; resume during CIR stay or at discharge if warranted.  Other       Clinically significant medication issues were identified that warrant physician communication and completion of prescribed/recommended actions by midnight of the next day:  No  Pharmacist comments:   Time spent performing this drug regimen  review (minutes):  20   Dennie Fetters, [place] 12/02/2023 5:40 PM

## 2023-12-02 NOTE — Discharge Summary (Signed)
Physician Discharge Summary   Patient: Sharon Arias MRN: [id number] DOB: 04/15/1961  Admit date:     11/26/2023  Discharge date: 12/02/23  Discharge Physician: Marcelino Duster   PCP: Marguarite Arbour, MD   Recommendations at discharge:    PCP follow up after 1 week rehab discharge. Neurology follow up as scheduled.  Discharge Diagnoses: Principal Problem:   Dizziness Active Problems:   Acute CVA (cerebrovascular accident) (HCC)   Anemia of chronic kidney failure, stage 4 (severe) (HCC)   Coronary artery disease   Type 2 diabetes mellitus with renal complication (HCC)   HTN (hypertension)   ESRD (end stage renal disease) on dialysis [facility])  Resolved Problems:   * No resolved hospital problems. *  Hospital Course: Sharon Arias is a 62 y.o. female with medical history significant for history of stroke, CAD s/p CABG and coronary stent on aspirin and Plavix, chronic diastolic CHF, ESRD on hemodialysis, type II DM, diabetic neuropathy, legal blindness, anemia of chronic disease, sleep apnea, obesity.  She presented to the hospital because of nausea, vomiting and dizziness.  She describes the dizziness as spinning sensation, as if the room is spinning.  She felt dizzy on 11/25/2023 around 10 AM.  She went for hemodialysis that same day.  However, when she got home, her symptoms had worsened.  She had nausea and multiple episodes of vomiting.  She presented to the hospital the following day because of worsening symptoms.    She was found to have acute stroke admitted to hospitalist service for further management evaluation.  Patient is currently on aspirin, Plavix and statin.  She has been having symptoms of spinning sensation.  Patient is hemodynamically stable to be discharged to inpatient rehab facility.   Assessment and Plan: Acute ischemic stroke in the left brachium pontis severe vertigo, severe narrowing in the ophthalmic segment of the right ICA, with left facial droop:  Continue aspirin, Plavix and atorvastatin 80mg .  Antiemetics, meclizine as needed.   Neurology suggested outpatient follow up. She is stable to be discharged to acute inpatient rehab.  Follow-up with TOC to assist with disposition.    ESRD: MWF scheduled. Follow-up with nephrologist for HD needs.   Anemia of chronic disease: her hemoglobin 11.6 stable.  No active bleeding.      Chronic diastolic CHF: No acute exacerbation. 2D echo showed EF estimated at 55 to 60%, indeterminate LV diastolic parameters, mild mitral stenosis, no evidence of interatrial shunt.   CAD s/p CABG and coronary stent: Continue aspirin and Plavix. Statin changed to Lipitor 80mg .    Type 2 DM with hyperglycemia:  Increase insulin glargine to 60 units nightly and NovoLog 20 units 3 times daily with meals and sliding scale.  Hemoglobin A1c 8.1. She is on higher doses of insulin at home.    Hyperlipidemia: Continue Lipitor 80mg .    Hypertension: Continue antihypertensives -hydralazine, losartan, metoprolol and amlodipine. Has occasional sinus brady on tele but asymptomatic.     Sleep apnea Obesity with BMI 38.47 Contributing to her current condition. Advised diet, exercise and weight reduction.       Consultants: Neurology, Nephrology Procedures performed: none  Disposition: Rehabilitation facility Diet recommendation:  Discharge Diet Orders (From admission, onward)     Start     Ordered   12/02/23 0000  Diet - low sodium heart healthy        12/02/23 1309           Cardiac and Carb modified diet DISCHARGE MEDICATION: Allergies  as of 12/02/2023       Reactions   Ozempic (0.25 Or 0.5 Mg-dose) [semaglutide(0.25 Or 0.5mg -dos)] Diarrhea, Nausea Only   Trulicity [dulaglutide] Diarrhea, Nausea Only   Other    Anesthesia--nausea/vomiting        Medication List     STOP taking these medications    diphenhydramine-acetaminophen 25-500 MG Tabs tablet Commonly known as: TYLENOL PM   insulin  lispro 100 UNIT/ML KwikPen Commonly known as: HUMALOG   rosuvastatin 40 MG tablet Commonly known as: CRESTOR   Tresiba 100 UNIT/ML Soln Generic drug: Insulin Degludec       TAKE these medications    (feeding supplement) PROSource Plus liquid Take 30 mLs by mouth 2 (two) times daily between meals.   allopurinol 300 MG tablet Commonly known as: ZYLOPRIM Take 300 mg by mouth in the morning.   amLODipine 5 MG tablet Commonly known as: NORVASC Take 5 mg by mouth at bedtime.   aspirin EC 81 MG tablet Take 81 mg by mouth in the morning.   atorvastatin 80 MG tablet Commonly known as: LIPITOR Take 1 tablet (80 mg total) by mouth daily.   clopidogrel 75 MG tablet Commonly known as: PLAVIX Take 1 tablet (75 mg total) by mouth daily. What changed: when to take this   furosemide 40 MG tablet Commonly known as: LASIX Take 40 mg by mouth daily. Take one tablet daily on non-dialysis days.   hydrALAZINE 50 MG tablet Commonly known as: APRESOLINE Take 50 mg by mouth in the morning and at bedtime.   insulin aspart 100 UNIT/ML injection Commonly known as: novoLOG Inject 20 Units into the skin 3 (three) times daily with meals.   insulin aspart 100 UNIT/ML injection Commonly known as: novoLOG Inject 0-9 Units into the skin 3 (three) times daily with meals. Correction coverage: Sensitive (thin, NPO, renal) CBG < 70: Implement Hypoglycemia Standing Orders and refer to Hypoglycemia Standing Orders sidebar report CBG 70 - 120: 0 units CBG 121 - 150: 1 unit CBG 151 - 200: 2 units CBG 201 - 250: 3 units CBG 251 - 300: 5 units CBG 301 - 350: 7 units CBG 351 - 400: 9 units CBG > 400: call MD and obtain STAT lab verification   insulin aspart 100 UNIT/ML injection Commonly known as: novoLOG Inject 0-5 Units into the skin at bedtime. Correction coverage: HS scale CBG < 70: Implement Hypoglycemia Standing Orders and refer to Hypoglycemia Standing Orders sidebar report CBG 70 - 120: 0  units CBG 121 - 150: 0 units CBG 151 - 200: 0 units CBG 201 - 250: 2 units CBG 251 - 300: 3 units CBG 301 - 350: 4 units CBG 351 - 400: 5 units CBG > 400: call MD and obtain STAT lab verification   insulin glargine-yfgn 100 UNIT/ML injection Commonly known as: SEMGLEE Inject 0.6 mLs (60 Units total) into the skin at bedtime.   lidocaine-prilocaine cream Commonly known as: EMLA Apply 1 application topically as needed (prior to dialysis).   losartan 100 MG tablet Commonly known as: COZAAR Take 100 mg by mouth at bedtime.   meclizine 25 MG tablet Commonly known as: ANTIVERT Take 1 tablet (25 mg total) by mouth 3 (three) times daily as needed for dizziness.   metoprolol tartrate 50 MG tablet Commonly known as: LOPRESSOR Take 50 mg by mouth 2 (two) times daily.   multivitamin Tabs tablet Take 1 tablet by mouth at bedtime.   ondansetron 8 MG disintegrating tablet Commonly known as: ZOFRAN-ODT  Take 1 tablet (8 mg total) by mouth every 8 (eight) hours as needed for nausea or vomiting.   OneTouch Verio test strip Generic drug: glucose blood   sevelamer carbonate 800 MG tablet Commonly known as: RENVELA Take 1 tablet (800 mg total) by mouth 3 (three) times daily. What changed: how much to take   Vitamin D (Ergocalciferol) 1.25 MG (50000 UNIT) Caps capsule Commonly known as: DRISDOL Take 50,000 Units by mouth every 7 (seven) days.        Follow-up Information     Marguarite Arbour, MD Follow up in 3 week(s).   Specialty: Internal Medicine Contact information: [address] [place] [address] [phone]                Discharge Exam: Ceasar Mons Weights   11/30/23 1024 11/30/23 1409 12/02/23 0823  Weight: 111.2 kg 111.4 kg 110.8 kg   General - Elderly obese Caucasian female, has dizziness, spinning sensation. HEENT - PERRLA, EOMI, atraumatic head, non tender sinuses. Lung - distant breath sounds, diffuse rales, no rhonchi,  wheezes. Heart - S1, S2 heard, no murmurs, rubs, trace pedal edema. Abdomen - Soft, non tender, bowel sounds good Neuro - Alert, awake and oriented x 3, left facial droop noted. Skin - Warm and dry.  Condition at discharge: stable  The results of significant diagnostics from this hospitalization (including imaging, microbiology, ancillary and laboratory) are listed below for reference.   Imaging Studies: ECHOCARDIOGRAM COMPLETE BUBBLE STUDY Result Date: 11/27/2023    ECHOCARDIOGRAM REPORT   Patient Name:   BREIA ABREW Date of Exam: 11/27/2023 Medical Rec #:  [id number]      Height:       67.0 in Accession #:    [id number]     Weight:       250.0 lb Date of Birth:  [birth year]       BSA:          2.223 m Patient Age:    62 years       BP:           175/59 mmHg Patient Gender: F              HR:           67 bpm. Exam Location:  [facility] Procedure: 2D Echo, Cardiac Doppler, Color Doppler and Saline Contrast Bubble            Study Indications:     Stroke 434.91 / I63.9  History:         Patient has prior history of Echocardiogram examinations. CHF;                  Risk Factors:Diabetes.  Sonographer:     Neysa Bonito Roar Referring Phys:  [id number] Eliezer Mccoy PATEL Diagnosing Phys: Jodelle Red MD IMPRESSIONS  1. Left ventricular ejection fraction, by estimation, is 55 to 60%. The left ventricle has normal function. The left ventricle has no regional wall motion abnormalities. There is mild left ventricular hypertrophy. Left ventricular diastolic parameters are indeterminate.  2. Right ventricular systolic function is normal. The right ventricular size is normal. Tricuspid regurgitation signal is inadequate for assessing PA pressure.  3. The mitral valve is degenerative. Trivial mitral valve regurgitation. Mild mitral stenosis.  4. The aortic valve was not well visualized. Aortic valve regurgitation is not visualized. Aortic valve sclerosis is present, with no evidence of aortic valve stenosis.  5. Agitated  saline contrast bubble study was  negative, with no evidence of any interatrial shunt. Comparison(s): No significant change from prior study. Conclusion(s)/Recommendation(s): No intracardiac source of embolism detected on this transthoracic study. Consider a transesophageal echocardiogram to exclude cardiac source of embolism if clinically indicated. FINDINGS  Left Ventricle: Left ventricular ejection fraction, by estimation, is 55 to 60%. The left ventricle has normal function. The left ventricle has no regional wall motion abnormalities. The left ventricular internal cavity size was normal in size. There is  mild left ventricular hypertrophy. Left ventricular diastolic parameters are indeterminate. Right Ventricle: The right ventricular size is normal. Right vetricular wall thickness was not well visualized. Right ventricular systolic function is normal. Tricuspid regurgitation signal is inadequate for assessing PA pressure. Left Atrium: Left atrial size was normal in size. Right Atrium: Right atrial size was normal in size. Pericardium: There is no evidence of pericardial effusion. Mitral Valve: The mitral valve is degenerative in appearance. There is mild thickening of the mitral valve leaflet(s). There is mild calcification of the mitral valve leaflet(s). Trivial mitral valve regurgitation. Mild mitral valve stenosis. MV peak gradient, 12.7 mmHg. The mean mitral valve gradient is 4.0 mmHg. Tricuspid Valve: The tricuspid valve is not well visualized. Tricuspid valve regurgitation is not demonstrated. No evidence of tricuspid stenosis. Aortic Valve: The aortic valve was not well visualized. Aortic valve regurgitation is not visualized. Aortic valve sclerosis is present, with no evidence of aortic valve stenosis. Aortic valve mean gradient measures 3.0 mmHg. Aortic valve peak gradient measures 6.7 mmHg. Aortic valve area, by VTI measures 1.80 cm. Pulmonic Valve: The pulmonic valve was not well visualized. Pulmonic  valve regurgitation is not visualized. No evidence of pulmonic stenosis. Aorta: The aortic root and ascending aorta are structurally normal, with no evidence of dilitation. IAS/Shunts: The interatrial septum appears to be lipomatous. The interatrial septum was not well visualized. Agitated saline contrast was given intravenously to evaluate for intracardiac shunting. Agitated saline contrast bubble study was negative, with no evidence of any interatrial shunt.  LEFT VENTRICLE PLAX 2D LVIDd:         5.30 cm   Diastology LVIDs:         4.00 cm   LV e' medial:    4.68 cm/s LV PW:         1.20 cm   LV E/e' medial:  31.2 LV IVS:        1.30 cm   LV e' lateral:   6.96 cm/s LVOT diam:     1.70 cm   LV E/e' lateral: 21.0 LV SV:         54 LV SV Index:   24 LVOT Area:     2.27 cm  RIGHT VENTRICLE RV Basal diam:  3.60 cm RV Mid diam:    3.00 cm RV S prime:     8.05 cm/s TAPSE (M-mode): 2.0 cm LEFT ATRIUM             Index        RIGHT ATRIUM           Index LA diam:        4.00 cm 1.80 cm/m   RA Area:     15.60 cm LA Vol (A2C):   51.0 ml 22.94 ml/m  RA Volume:   35.10 ml  15.79 ml/m LA Vol (A4C):   56.3 ml 25.32 ml/m LA Biplane Vol: 53.4 ml 24.02 ml/m  AORTIC VALVE  PULMONIC VALVE AV Area (Vmax):    1.60 cm     PV Vmax:        1.15 m/s AV Area (Vmean):   1.71 cm     PV Peak grad:   5.3 mmHg AV Area (VTI):     1.80 cm     RVOT Peak grad: 2 mmHg AV Vmax:           129.00 cm/s AV Vmean:          84.900 cm/s AV VTI:            0.300 m AV Peak Grad:      6.7 mmHg AV Mean Grad:      3.0 mmHg LVOT Vmax:         91.00 cm/s LVOT Vmean:        63.900 cm/s LVOT VTI:          0.238 m LVOT/AV VTI ratio: 0.79  AORTA Ao Asc diam: 2.90 cm MITRAL VALVE MV Area (PHT): 5.16 cm     SHUNTS MV Area VTI:   1.22 cm     Systemic VTI:  0.24 m MV Peak grad:  12.7 mmHg    Systemic Diam: 1.70 cm MV Mean grad:  4.0 mmHg MV Vmax:       1.78 m/s MV Vmean:      85.6 cm/s MV Decel Time: 147 msec MV E velocity: 146.00 cm/s MV A  velocity: 91.70 cm/s MV E/A ratio:  1.59 MV A Prime:    8.9 cm/s Jodelle Red MD Electronically signed by Jodelle Red MD Signature Date/Time: 11/27/2023/4:54:31 PM    Final    DG Chest 1 View Result Date: 11/27/2023 CLINICAL DATA:  62 year old female with left brainstem infarct. EXAM: CHEST  1 VIEW COMPARISON:  Chest radiographs 02/05/2021. FINDINGS: Portable AP semi upright view at 0421 hours. More lordotic positioning. Chronic sternotomy, CABG. Stable mild cardiomegaly mediastinal contours. Visualized tracheal air column is within normal limits. Allowing for portable technique the lungs are clear. No pneumothorax or pleural effusion. Paucity of bowel gas. No acute osseous abnormality identified. IMPRESSION: Chronic cardiac surgery.  No acute cardiopulmonary abnormality. Electronically Signed   By: Odessa Fleming M.D.   On: 11/27/2023 05:31   MR BRAIN WO CONTRAST Result Date: 11/26/2023 CLINICAL DATA:  Neuro deficit, acute, stroke suspected; Vertebral artery dissection suspected; Vertigo, central EXAM: MRI HEAD WITHOUT CONTRAST MRA HEAD WITHOUT CONTRAST MRA NECK WITHOUT CONTRAST TECHNIQUE: Multiplanar, multi-echo pulse sequences of the brain and surrounding structures were acquired without intravenous contrast. Angiographic images of the Circle of Willis were acquired using MRA technique without intravenous contrast. Angiographic images of the neck were acquired using MRA technique without intravenous contrast. Carotid stenosis measurements (when applicable) are obtained utilizing NASCET criteria, using the distal internal carotid diameter as the denominator. COMPARISON:  None Available. FINDINGS: MRI HEAD FINDINGS Brain: There acute infarcts in the brachium pontis on the left (series 8, image 10). No hemorrhage. No hydrocephalus. No extra-axial fluid collection. No mass effect. No mass lesion. Background of chronic microvascular ischemic change with a chronic infarct in the right parietal lobe.  Vascular: Normal flow voids. Skull and upper cervical spine: Normal marrow signal. Sinuses/Orbits: No middle ear or mastoid effusion. Paranasal sinuses are clear. Bilateral lens replacement. Orbits are otherwise unremarkable. Other: None. MRA HEAD FINDINGS Anterior circulation: Severe narrowing in the ophthalmic segment of the right ICA (series 1, image 71). No aneurysm. No vascular malformation. No occlusion. Posterior circulation: No aneurysm. No occlusion.  No significant stenosis. Anatomic variants: None MRA NECK FINDINGS Aortic arch: Not visualized, likely to respiratory motion artifact. Right carotid system: The right common carotid artery in the proximal aspect of the right internal carotid artery are poorly visualized and incompletely assessed due to the degree of respiratory motion artifact. The mid to distal segments of the right ICA are normal in appearance. Left carotid system: The left common carotid artery in the proximal aspect of the left internal carotid artery are poorly visualized and incompletely assessed due to the degree of respiratory motion artifact. The mid to distal segments of the left ICA are normal in appearance. Vertebral arteries: The V1 segments of bilateral vertebral arteries are not visualized due to the degree of respiratory motion artifact. The distal V2 segments in the V4 segments are normal in appearance. Other: None. IMPRESSION: 1. Acute infarct in the brachium pontis on the left. 2. Severe narrowing in the ophthalmic segment of the right ICA. 3. The aortic arch, common carotid arteries, proximal internal carotid arteries, and V1 segments of bilateral vertebral arteries are poorly visualized and incompletely assessed due to the degree of respiratory motion artifact. The mid to distal segments of the carotid and vertebral arteries are normal in appearance. Electronically Signed   By: Lorenza Cambridge M.D.   On: 11/26/2023 19:28   MR ANGIO HEAD WO CONTRAST Result Date:  11/26/2023 CLINICAL DATA:  Neuro deficit, acute, stroke suspected; Vertebral artery dissection suspected; Vertigo, central EXAM: MRI HEAD WITHOUT CONTRAST MRA HEAD WITHOUT CONTRAST MRA NECK WITHOUT CONTRAST TECHNIQUE: Multiplanar, multi-echo pulse sequences of the brain and surrounding structures were acquired without intravenous contrast. Angiographic images of the Circle of Willis were acquired using MRA technique without intravenous contrast. Angiographic images of the neck were acquired using MRA technique without intravenous contrast. Carotid stenosis measurements (when applicable) are obtained utilizing NASCET criteria, using the distal internal carotid diameter as the denominator. COMPARISON:  None Available. FINDINGS: MRI HEAD FINDINGS Brain: There acute infarcts in the brachium pontis on the left (series 8, image 10). No hemorrhage. No hydrocephalus. No extra-axial fluid collection. No mass effect. No mass lesion. Background of chronic microvascular ischemic change with a chronic infarct in the right parietal lobe. Vascular: Normal flow voids. Skull and upper cervical spine: Normal marrow signal. Sinuses/Orbits: No middle ear or mastoid effusion. Paranasal sinuses are clear. Bilateral lens replacement. Orbits are otherwise unremarkable. Other: None. MRA HEAD FINDINGS Anterior circulation: Severe narrowing in the ophthalmic segment of the right ICA (series 1, image 71). No aneurysm. No vascular malformation. No occlusion. Posterior circulation: No aneurysm. No occlusion. No significant stenosis. Anatomic variants: None MRA NECK FINDINGS Aortic arch: Not visualized, likely to respiratory motion artifact. Right carotid system: The right common carotid artery in the proximal aspect of the right internal carotid artery are poorly visualized and incompletely assessed due to the degree of respiratory motion artifact. The mid to distal segments of the right ICA are normal in appearance. Left carotid system: The  left common carotid artery in the proximal aspect of the left internal carotid artery are poorly visualized and incompletely assessed due to the degree of respiratory motion artifact. The mid to distal segments of the left ICA are normal in appearance. Vertebral arteries: The V1 segments of bilateral vertebral arteries are not visualized due to the degree of respiratory motion artifact. The distal V2 segments in the V4 segments are normal in appearance. Other: None. IMPRESSION: 1. Acute infarct in the brachium pontis on the left. 2. Severe narrowing in  the ophthalmic segment of the right ICA. 3. The aortic arch, common carotid arteries, proximal internal carotid arteries, and V1 segments of bilateral vertebral arteries are poorly visualized and incompletely assessed due to the degree of respiratory motion artifact. The mid to distal segments of the carotid and vertebral arteries are normal in appearance. Electronically Signed   By: Lorenza Cambridge M.D.   On: 11/26/2023 19:28   MR ANGIO NECK WO CONTRAST Result Date: 11/26/2023 CLINICAL DATA:  Neuro deficit, acute, stroke suspected; Vertebral artery dissection suspected; Vertigo, central EXAM: MRI HEAD WITHOUT CONTRAST MRA HEAD WITHOUT CONTRAST MRA NECK WITHOUT CONTRAST TECHNIQUE: Multiplanar, multi-echo pulse sequences of the brain and surrounding structures were acquired without intravenous contrast. Angiographic images of the Circle of Willis were acquired using MRA technique without intravenous contrast. Angiographic images of the neck were acquired using MRA technique without intravenous contrast. Carotid stenosis measurements (when applicable) are obtained utilizing NASCET criteria, using the distal internal carotid diameter as the denominator. COMPARISON:  None Available. FINDINGS: MRI HEAD FINDINGS Brain: There acute infarcts in the brachium pontis on the left (series 8, image 10). No hemorrhage. No hydrocephalus. No extra-axial fluid collection. No mass  effect. No mass lesion. Background of chronic microvascular ischemic change with a chronic infarct in the right parietal lobe. Vascular: Normal flow voids. Skull and upper cervical spine: Normal marrow signal. Sinuses/Orbits: No middle ear or mastoid effusion. Paranasal sinuses are clear. Bilateral lens replacement. Orbits are otherwise unremarkable. Other: None. MRA HEAD FINDINGS Anterior circulation: Severe narrowing in the ophthalmic segment of the right ICA (series 1, image 71). No aneurysm. No vascular malformation. No occlusion. Posterior circulation: No aneurysm. No occlusion. No significant stenosis. Anatomic variants: None MRA NECK FINDINGS Aortic arch: Not visualized, likely to respiratory motion artifact. Right carotid system: The right common carotid artery in the proximal aspect of the right internal carotid artery are poorly visualized and incompletely assessed due to the degree of respiratory motion artifact. The mid to distal segments of the right ICA are normal in appearance. Left carotid system: The left common carotid artery in the proximal aspect of the left internal carotid artery are poorly visualized and incompletely assessed due to the degree of respiratory motion artifact. The mid to distal segments of the left ICA are normal in appearance. Vertebral arteries: The V1 segments of bilateral vertebral arteries are not visualized due to the degree of respiratory motion artifact. The distal V2 segments in the V4 segments are normal in appearance. Other: None. IMPRESSION: 1. Acute infarct in the brachium pontis on the left. 2. Severe narrowing in the ophthalmic segment of the right ICA. 3. The aortic arch, common carotid arteries, proximal internal carotid arteries, and V1 segments of bilateral vertebral arteries are poorly visualized and incompletely assessed due to the degree of respiratory motion artifact. The mid to distal segments of the carotid and vertebral arteries are normal in  appearance. Electronically Signed   By: Lorenza Cambridge M.D.   On: 11/26/2023 19:28    Microbiology: Results for orders placed or performed during the hospital encounter of 11/26/23  MRSA Next Gen by PCR, Nasal     Status: None   Collection Time: 11/29/23  9:13 PM   Specimen: Nasal Mucosa; Nasal Swab  Result Value Ref Range Status   MRSA by PCR Next Gen NOT DETECTED NOT DETECTED Final    Comment: (NOTE) The GeneXpert MRSA Assay (FDA approved for NASAL specimens only), is one component of a comprehensive MRSA colonization surveillance program. It is  not intended to diagnose MRSA infection nor to guide or monitor treatment for MRSA infections. Test performance is not FDA approved in patients less than 10 years old. Performed at [facility], [address] [place], [place] [address]     Labs: CBC: Recent Labs  Lab 11/26/23 1228 11/28/23 0900 11/30/23 1044  WBC 13.1* 9.8 11.2*  HGB 11.7* 11.4* 11.6*  HCT 34.8* 34.0* 34.0*  MCV 91.6 90.7 89.2  PLT 268 259 247   Basic Metabolic Panel: Recent Labs  Lab 11/26/23 1228 11/28/23 0900 11/30/23 1044  NA 138 138 138  K 3.7 3.8 4.0  CL 97* 104 104  CO2 24 22 21*  GLUCOSE 297* 164* 157*  BUN 36* 51* 62*  CREATININE 3.52* 4.11* 4.63*  CALCIUM 9.6 9.4 9.3  PHOS  --  4.9* 5.9*   Liver Function Tests: Recent Labs  Lab 11/28/23 0900 11/30/23 1044  ALBUMIN 3.7 3.6   CBG: Recent Labs  Lab 11/30/23 1955 12/01/23 0815 12/01/23 1223 12/01/23 1554 12/01/23 2133  GLUCAP 356* 228* 220* 136* 92    Discharge time spent: 38 minutes.  Signed: Marcelino Duster, MD [facility] 12/02/2023

## 2023-12-03 DIAGNOSIS — I639 Cerebral infarction, unspecified: Secondary | ICD-10-CM | POA: Diagnosis not present

## 2023-12-03 LAB — GLUCOSE, CAPILLARY
Glucose-Capillary: 201 mg/dL — ABNORMAL HIGH (ref 70–99)
Glucose-Capillary: 272 mg/dL — ABNORMAL HIGH (ref 70–99)
Glucose-Capillary: 200 mg/dL — ABNORMAL HIGH (ref 70–99)
Glucose-Capillary: 143 mg/dL — ABNORMAL HIGH (ref 70–99)

## 2023-12-03 MED ORDER — INSULIN GLARGINE-YFGN 100 UNIT/ML ~~LOC~~ SOLN
63.0000 [IU] | Freq: Every day | SUBCUTANEOUS | Status: DC
  Administered 2023-12-03 – 2023-12-10 (×6): 63 [IU] via SUBCUTANEOUS
  Administered 2023-12-11: 40 [IU] via SUBCUTANEOUS
  Administered 2023-12-12 – 2023-12-13 (×2): 63 [IU] via SUBCUTANEOUS
  Filled 2023-12-03 (×12): qty 0.63

## 2023-12-03 NOTE — Evaluation (Signed)
Occupational Therapy Assessment and Plan  Patient Details  Name: Sharon Arias MRN: [id number] Date of Birth: 10-07-1961  OT Diagnosis: blindness and low vision, disturbance of vision, lumbago (low back pain), and muscle weakness (generalized) Rehab Potential: Rehab Potential (ACUTE ONLY): Good ELOS: 7-14 days   Today's Date: 12/03/2023 OT Individual Time: 5643-3295 OT Individual Time Calculation (min): 70 min     Hospital Problem: Principal Problem:   Ischemic cerebrovascular accident (CVA) (HCC)   Past Medical History:  Past Medical History:  Diagnosis Date   Anemia in chronic kidney disease 05/19/2016   Anxiety    Aortic atherosclerosis (HCC)    Blind    CAD (coronary artery disease)    Cardiac murmur    CHF (congestive heart failure) (HCC)    G2DD on 02/2020 TTE   Chicken pox    Chronic anticoagulation    CVA (cerebral vascular accident) (HCC)    DDD (degenerative disc disease), lumbar    Detached retina    Diabetic neuropathy (HCC)    Diabetic retinopathy (HCC)    legally blind   Dyspnea    ESRD needing dialysis (HCC)    Hx of CABG    Hyperlipidemia    Hypertension    NSTEMI (non-ST elevated myocardial infarction) (HCC) 06/2012   Obesity    Pneumonia    PONV (postoperative nausea and vomiting)    Sciatica of right side    Sleep apnea    CPAP NIGHTLY   T2DM (type 2 diabetes mellitus) (HCC)    Past Surgical History:  Past Surgical History:  Procedure Laterality Date   A/V FISTULAGRAM Left 04/21/2021   Procedure: A/V FISTULAGRAM;  Surgeon: Renford Dills, MD;  Location: [facility] INVASIVE CV [facility];  Service: Cardiovascular;  Laterality: Left;   A/V FISTULAGRAM Left 11/02/2022   Procedure: A/V Fistulagram;  Surgeon: Renford Dills, MD;  Location: [facility] CV [facility];  Service: Cardiovascular;  Laterality: Left;   AV FISTULA PLACEMENT Left 02/06/2021   Procedure: ARTERIOVENOUS (AV) FISTULA CREATION (BRACHIAL CEPHALIC );  Surgeon: Renford Dills, MD;   Location: [facility] ORS;  Service: Vascular;  Laterality: Left;   COLONOSCOPY WITH PROPOFOL N/A 01/26/2016   Procedure: COLONOSCOPY WITH PROPOFOL;  Surgeon: Christena Deem, MD;  Location: [facility] ENDOSCOPY;  Service: Endoscopy;  Laterality: N/A;   COLONOSCOPY WITH PROPOFOL N/A 01/27/2016   Procedure: COLONOSCOPY WITH PROPOFOL;  Surgeon: Christena Deem, MD;  Location: [facility] ENDOSCOPY;  Service: Endoscopy;  Laterality: N/A;   CORONARY ARTERY BYPASS GRAFT  2013   DIALYSIS/PERMA CATHETER INSERTION N/A 02/03/2021   Procedure: DIALYSIS/PERMA CATHETER INSERTION;  Surgeon: Renford Dills, MD;  Location: [facility] INV[facility] CV [facility];  Service: Cardiovascular;  Laterality: N/A;   DIALYSIS/PERMA CATHETER INSERTION N/A 06/16/2021   Procedure: DIALYSIS/PERMA CATHETER INSERTION;  Surgeon: Annice Needy, MD;  Location: [facility] INV[facility] CV [facility];  Service: Cardiovascular;  Laterality: N/A;   DIALYSIS/PERMA CATHETER REMOVAL N/A 09/22/2021   Procedure: DIALYSIS/PERMA CATHETER REMOVAL;  Surgeon: Renford Dills, MD;  Location: [facility] INVASIVE CV LAB;  Service: Cardiovascular;  Laterality: N/A;   INCISION AND DRAINAGE     chest abscess   INCISION AND DRAINAGE ABSCESS N/A 12/11/2018   Procedure: INCISION AND DRAINAGE PERINEAL;  Surgeon: Leafy Ro, MD;  Location: [facility] ORS;  Service: General;  Laterality: N/A;   RETINAL LASER PROCEDURE      Assessment & Plan Clinical Impression: Patient is a 62 y.o. year old female with recent admission to to [facility] 11/26/2023 with progressive dizziness that  started during a session of her hemodialysis as well as blurred vision with headache. MRI/MRA of the head showed acute infarct in the brachium pontis on the left. Severe narrowing in the ophthalmic segment of the right ICA. Admission chemistries unremarkable except WBC 13,100, BUN 36, creatinine 3.52, hemoglobin A1c 8.1, urine drug screen negative. Echocardiogram with ejection fraction of 55 to 60% no wall motion abnormalities. Patient has history  of CVA, DM with HD, neuropathy, and vision loss.  Patient transferred to CIR on 12/02/2023 .    Patient currently requires min with basic self-care skills secondary to muscle weakness and decreased visual acuity and diplopia .  Prior to hospitalization, patient could complete basic self care and light IADL's with modified independent .  Patient will benefit from skilled intervention to increase independence with basic self-care skills and increase level of independence with iADL prior to discharge home independently with daughter and family available and living close.  Anticipate patient will require intermittent supervision and follow up home health.  OT - End of Session Activity Tolerance: Tolerates 10 - 20 min activity with multiple rests Endurance Deficit: Yes OT Assessment Rehab Potential (ACUTE ONLY): Good OT Barriers to Discharge: Hemodialysis OT Patient demonstrates impairments in the following area(s): Balance;Vision;Endurance;Motor OT Basic ADL's Functional Problem(s): Grooming;Bathing;Dressing;Toileting OT Advanced ADL's Functional Problem(s): Simple Meal Preparation OT Transfers Functional Problem(s): Toilet;Tub/Shower OT Additional Impairment(s): Fuctional Use of Upper Extremity OT Plan OT Intensity: Minimum of 1-2 x/day, 45 to 90 minutes OT Frequency: 5 out of 7 days OT Duration/Estimated Length of Stay: 7-14 days OT Treatment/Interventions: Balance/vestibular training;Disease mangement/prevention;Neuromuscular re-education;Self Care/advanced ADL retraining;Therapeutic Exercise;DME/adaptive equipment instruction;UE/LE Strength taining/ROM;UE/LE Coordination activities;Patient/family education;Discharge planning;Functional mobility training;Therapeutic Activities;Visual/perceptual remediation/compensation OT Self Feeding Anticipated Outcome(s): Independent OT Basic Self-Care Anticipated Outcome(s): Modified independent OT Toileting Anticipated Outcome(s): Modified Independent OT  Bathroom Transfers Anticipated Outcome(s): Modified Independent OT Recommendation Recommendations for Other Services: Speech consult;Vestibular eval;Therapeutic Recreation consult Therapeutic Recreation Interventions: Stress management;Outing/community reintergration;Kitchen group Patient destination: Home Follow Up Recommendations: Home health OT Equipment Recommended: Tub/shower bench   OT Evaluation Precautions/Restrictions  Precautions Precautions: Fall Precaution Comments: low vision, diplopia, spinning sensation with mobility Restrictions Weight Bearing Restrictions Per Provider Order: No General Chart Reviewed: Yes Family/Caregiver Present: No Vital Signs   Pain Pain Assessment Pain Scale: 0-10 Pain Score: 0-No pain Pain Type: Chronic pain Pain Location: Back Pain Descriptors / Indicators: Aching;Pins and needles Pain Onset: On-going Patients Stated Pain Goal: 0 Home Living/Prior Functioning Home Living Available Help at Discharge: Available PRN/intermittently, Family Type of Home: House Home Access: Stairs to enter, Ship broker of Steps: 2 Entrance Stairs-Rails: Right Home Layout: One level Bathroom Shower/Tub: Health visitor: Handicapped height Bathroom Accessibility: Yes Additional Comments: apartment has a ramped entrance, Plans to live in the apartment and have her daughter move to her attached house.  Lives With: Alone IADL History Homemaking Responsibilities: Yes Homemaking Comments: Had granddaughters helping with IADL's prior Current License: No Mode of Transportation: Family Education: 2 years college Prior Function Level of Independence: Requires assistive device for independence Driving: No Vision Baseline Vision/History: 2 Legally blind Ability to See in Adequate Light: 2 Moderately impaired Patient Visual Report: Diplopia Vision Assessment?: Vision impaired- to be further tested in functional  context Perception  Perception: Within Functional Limits Praxis   Cognition Cognition Overall Cognitive Status: Within Functional Limits for tasks assessed Arousal/Alertness: Awake/alert Memory: Appears intact Awareness: Appears intact Problem Solving: Appears intact Safety/Judgment: Appears intact Brief Interview for Mental Status (BIMS) Repetition of Three Words (  First Attempt): 3 Temporal Orientation: Year: Correct Temporal Orientation: Month: Accurate within 5 days Temporal Orientation: Day: Correct Recall: "Sock": Yes, no cue required Recall: "Blue": Yes, no cue required Recall: "Bed": Yes, no cue required BIMS Summary Score: 15 Sensation Sensation Light Touch: Appears Intact Hot/Cold: Appears Intact Proprioception: Appears Intact Stereognosis: Appears Intact Additional Comments: Has some neuopathy in hands and feet. Coordination Gross Motor Movements are Fluid and Coordinated: Yes Fine Motor Movements are Fluid and Coordinated: No Motor  Motor Motor - Skilled Clinical Observations: Patient with slower movements of the LUE ensuring accuracy of task performed when compaired to the donimant RUE  Trunk/Postural Assessment - Patient with prior LBP that she does not want surgery for. Walks with a slight thoracic flexion while using the walker.    Balance- patient needing support in standing due to visual impairments and diplopia adding to dizziness with standing activity.    Extremity/Trunk Assessment RUE Assessment RUE Assessment: Within Functional Limits Active Range of Motion (AROM) Comments: Full ROM General Strength Comments: 4/5 strength grossly, Grasp 60#, lateral pinch 15# LUE Assessment LUE Assessment: Within Functional Limits Active Range of Motion (AROM) Comments: WFL grossly General Strength Comments: -4/5, grasp strength 44#, Lateral pinch 9#  Care Tool Care Tool Self Care Eating   Eating Assist Level: Set up assist    Oral Care    Oral Care Assist  Level: Set up assist    Bathing   Body parts bathed by patient: Right arm;Left upper leg;Right lower leg;Left arm;Chest;Left lower leg;Abdomen;Face;Front perineal area;Buttocks;Right upper leg     Assist Level: Contact Guard/Touching assist    Upper Body Dressing(including orthotics)   What is the patient wearing?: Pull over shirt   Assist Level: Set up assist    Lower Body Dressing (excluding footwear)   What is the patient wearing?: Underwear/pull up;Pants Assist for lower body dressing: Moderate Assistance - Patient 50 - 74%    Putting on/Taking off footwear   What is the patient wearing?: Non-skid slipper socks Assist for footwear: Dependent - Patient 0%       Care Tool Toileting Toileting activity   Assist for toileting: Contact Guard/Touching assist     Care Tool Bed Mobility Roll left and right activity   Roll left and right assist level: Supervision/Verbal cueing    Sit to lying activity   Sit to lying assist level: Supervision/Verbal cueing    Lying to sitting on side of bed activity  Supervision/Verbal cueing       Care Tool Transfers Sit to stand transfer   Sit to stand assist level: Contact Guard/Touching assist    Chair/bed transfer   Chair/bed transfer assist level: Contact Guard/Touching assist     Toilet transfer   Assist Level: Contact Guard/Touching assist     Care Tool Cognition  Expression of Ideas and Wants Expression of Ideas and Wants: 4. Without difficulty (complex and basic) - expresses complex messages without difficulty and with speech that is clear and easy to understand  Understanding Verbal and Non-Verbal Content Understanding Verbal and Non-Verbal Content: 4. Understands (complex and basic) - clear comprehension without cues or repetitions   Memory/Recall Ability Memory/Recall Ability : Current season;That he or she is in a hospital/hospital unit   Refer to Care Plan for Long Term Goals  SHORT TERM GOAL WEEK 1 OT Short Term  Goal 1 (Week 1): Patient to perform LE dresing with set up and vc's using AE as needed OT Short Term Goal 2 (Week 1): Patient to  tolerate standing grooming at sink x 3 minutes SBA for safety OT Short Term Goal 3 (Week 1): Patien to perform toileting with SPV using DME as needed  Recommendations for other services: Adult nurse group, Stress management, and Outing/community reintegration   Skilled Therapeutic Intervention ADL ADL Eating: Set up Where Assessed-Eating: Bed level Grooming: Setup Where Assessed-Grooming: Standing at sink Upper Body Bathing: Setup Where Assessed-Upper Body Bathing: Shower Lower Body Bathing: Contact guard Where Assessed-Lower Body Bathing: Shower Upper Body Dressing: Setup Where Assessed-Upper Body Dressing: Chair Lower Body Dressing: Minimal assistance Where Assessed-Lower Body Dressing: Chair Toileting: Contact guard Where Assessed-Toileting: Teacher, adult education: Furniture conservator/restorer Method: Proofreader: Acupuncturist: Insurance underwriter Method: Designer, industrial/product: Press photographer  Bed Mobility Bed Mobility: Rolling Right;Rolling Left;Supine to [employer] Right: Supervision/verbal cueing Rolling Left: Supervision/Verbal cueing Supine to Sit: Supervision/Verbal cueing Transfers Sit to Stand: Contact Guard/Touching assist   Discharge Criteria: Patient will be discharged from OT if patient refuses treatment 3 consecutive times without medical reason, if treatment goals not met, if there is a change in medical status, if patient makes no progress towards goals or if patient is discharged from hospital.  The above assessment, treatment plan, treatment alternatives and goals were discussed and mutually agreed upon: by patient  Warnell Forester 12/03/2023, 11:54 AM

## 2023-12-03 NOTE — Plan of Care (Signed)
  Problem: RH Balance Goal: LTG Patient will maintain dynamic sitting balance (PT) Description: LTG:  Patient will maintain dynamic sitting balance with assistance during mobility activities (PT) Flowsheets (Taken 12/03/2023 1530) LTG: Pt will maintain dynamic sitting balance during mobility activities with:: Independent with assistive device  Goal: LTG Patient will maintain dynamic standing balance (PT) Description: LTG:  Patient will maintain dynamic standing balance with assistance during mobility activities (PT) Flowsheets (Taken 12/03/2023 1530) LTG: Pt will maintain dynamic standing balance during mobility activities with:: Independent with assistive device    Problem: Sit to Stand Goal: LTG:  Patient will perform sit to stand with assistance level (PT) Description: LTG:  Patient will perform sit to stand with assistance level (PT) Flowsheets (Taken 12/03/2023 1530) LTG: PT will perform sit to stand in preparation for functional mobility with assistance level: Independent with assistive device   Problem: RH Bed Mobility Goal: LTG Patient will perform bed mobility with assist (PT) Description: LTG: Patient will perform bed mobility with assistance, with/without cues (PT). Flowsheets (Taken 12/03/2023 1530) LTG: Pt will perform bed mobility with assistance level of: Independent with assistive device    Problem: RH Bed to Chair Transfers Goal: LTG Patient will perform bed/chair transfers w/assist (PT) Description: LTG: Patient will perform bed to chair transfers with assistance (PT). Flowsheets (Taken 12/03/2023 1530) LTG: Pt will perform Bed to Chair Transfers with assistance level: Independent with assistive device    Problem: RH Car Transfers Goal: LTG Patient will perform car transfers with assist (PT) Description: LTG: Patient will perform car transfers with assistance (PT). Flowsheets (Taken 12/03/2023 1530) LTG: Pt will perform car transfers with assist:: Supervision/Verbal  cueing   Problem: RH Ambulation Goal: LTG Patient will ambulate in controlled environment (PT) Description: LTG: Patient will ambulate in a controlled environment, # of feet with assistance (PT). Flowsheets (Taken 12/03/2023 1530) LTG: Pt will ambulate in controlled environ  assist needed:: Supervision/Verbal cueing LTG: Ambulation distance in controlled environment: 160ft using LRAD Goal: LTG Patient will ambulate in home environment (PT) Description: LTG: Patient will ambulate in home environment, # of feet with assistance (PT). Flowsheets (Taken 12/03/2023 1530) LTG: Pt will ambulate in home environ  assist needed:: Independent with assistive device LTG: Ambulation distance in home environment: 64ft using LRAD   Problem: RH Stairs Goal: LTG Patient will ambulate up and down stairs w/assist (PT) Description: LTG: Patient will ambulate up and down # of stairs with assistance (PT) Flowsheets (Taken 12/03/2023 1530) LTG: Pt will ambulate up/down stairs assist needed:: Supervision/Verbal cueing LTG: Pt will  ambulate up and down number of stairs: 2 steps using HR

## 2023-12-03 NOTE — Evaluation (Signed)
Physical Therapy Assessment and Plan  Patient Details  Name: Sharon Arias MRN: [id number] Date of Birth: [birth year]  PT Diagnosis: Abnormal posture, Abnormality of gait, Difficulty walking, Impaired sensation, Muscle weakness, and Vertigo of central origin Rehab Potential: Excellent ELOS: ~1.5 weeks   Today's Date: 12/03/2023 PT Individual Time: 1352-1505 PT Individual Time Calculation (min): 73 min    Hospital Problem: Principal Problem:   Ischemic cerebrovascular accident (CVA) (HCC)   Past Medical History:  Past Medical History:  Diagnosis Date   Anemia in chronic kidney disease 05/19/2016   Anxiety    Aortic atherosclerosis (HCC)    Blind    CAD (coronary artery disease)    Cardiac murmur    CHF (congestive heart failure) (HCC)    G2DD on 02/2020 TTE   Chicken pox    Chronic anticoagulation    CVA (cerebral vascular accident) (HCC)    DDD (degenerative disc disease), lumbar    Detached retina    Diabetic neuropathy (HCC)    Diabetic retinopathy (HCC)    legally blind   Dyspnea    ESRD needing dialysis (HCC)    Hx of CABG    Hyperlipidemia    Hypertension    NSTEMI (non-ST elevated myocardial infarction) (HCC) 06/2012   Obesity    Pneumonia    PONV (postoperative nausea and vomiting)    Sciatica of right side    Sleep apnea    CPAP NIGHTLY   T2DM (type 2 diabetes mellitus) (HCC)    Past Surgical History:  Past Surgical History:  Procedure Laterality Date   A/V FISTULAGRAM Left 04/21/2021   Procedure: A/V FISTULAGRAM;  Surgeon: Renford Dills, MD;  Location: [facility] INVASIVE CV [facility];  Service: Cardiovascular;  Laterality: Left;   A/V FISTULAGRAM Left 11/02/2022   Procedure: A/V Fistulagram;  Surgeon: Renford Dills, MD;  Location: [facility];  Service: Cardiovascular;  Laterality: Left;   AV FISTULA PLACEMENT Left 02/06/2021   Procedure: ARTERIOVENOUS (AV) FISTULA CREATION (BRACHIAL CEPHALIC );  Surgeon: Renford Dills, MD;  Location: [facility]  ORS;  Service: Vascular;  Laterality: Left;   COLONOSCOPY WITH PROPOFOL N/A 01/26/2016   Procedure: COLONOSCOPY WITH PROPOFOL;  Surgeon: Christena Deem, MD;  Location: [facility] ENDOSCOPY;  Service: Endoscopy;  Laterality: N/A;   COLONOSCOPY WITH PROPOFOL N/A 01/27/2016   Procedure: COLONOSCOPY WITH PROPOFOL;  Surgeon: Christena Deem, MD;  Location: [facility] ENDOSCOPY;  Service: Endoscopy;  Laterality: N/A;   CORONARY ARTERY BYPASS GRAFT  2013   DIALYSIS/PERMA CATHETER INSERTION N/A 02/03/2021   Procedure: DIALYSIS/PERMA CATHETER INSERTION;  Surgeon: Renford Dills, MD;  Location: [facility] CV [facility];  Service: Cardiovascular;  Laterality: N/A;   DIALYSIS/PERMA CATHETER INSERTION N/A 06/16/2021   Procedure: DIALYSIS/PERMA CATHETER INSERTION;  Surgeon: Annice Needy, MD;  Location: [facility] CV [facility];  Service: Cardiovascular;  Laterality: N/A;   DIALYSIS/PERMA CATHETER REMOVAL N/A 09/22/2021   Procedure: DIALYSIS/PERMA CATHETER REMOVAL;  Surgeon: Renford Dills, MD;  Location: [facility] CV [facility];  Service: Cardiovascular;  Laterality: N/A;   INCISION AND DRAINAGE     chest abscess   INCISION AND DRAINAGE ABSCESS N/A 12/11/2018   Procedure: INCISION AND DRAINAGE PERINEAL;  Surgeon: Leafy Ro, MD;  Location: [facility] ORS;  Service: General;  Laterality: N/A;   RETINAL LASER PROCEDURE      Assessment & Plan Clinical Impression: Patient is a 62 y.o. right-handed female with history of CVA without residual deficit, obesity with BMI 39.50, OSA with CPAP, hypertension, anemia  of chronic disease, diastolic congestive heart failure, end-stage renal disease with hemodialysis, diabetes mellitus with neuropathy as well as retinopathy and legally blind, CAD with CABG 2013 maintained on aspirin as well as Plavix. Per chart review patient lives alone. 1 level home 2 steps to entry. Independent with mobility without assistive device. She does not drive. Her groceries are delivered. Presented to [facility] 11/26/2023  with progressive dizziness that started during a session of her hemodialysis as well as blurred vision with headache. MRI/MRA of the head showed acute infarct in the brachium pontis on the left. Severe narrowing in the ophthalmic segment of the right ICA. Admission chemistries unremarkable except WBC 13,100, BUN 36, creatinine 3.52, hemoglobin A1c 8.1, urine drug screen negative. Echocardiogram with ejection fraction of 55 to 60% no wall motion abnormalities. Neurology follow-up patient currently remains on low-dose aspirin and Plavix for CVA prophylaxis. Subcutaneous heparin added for DVT prophylaxis. Tolerating a regular consistency diet. Therapy evaluations completed due to patient decreased functional mobility was admitted for a comprehensive rehab program.   Patient transferred to CIR on 12/02/2023 .   Patient currently requires min with mobility secondary to muscle weakness, decreased cardiorespiratoy endurance, impaired timing and sequencing, unbalanced muscle activation, and decreased coordination, decreased visual motor skills, decreased midline orientation, central origin, and decreased standing balance, decreased postural control, and decreased balance strategies.  Prior to hospitalization, patient was independent  with mobility and lived with Alone, Other (Comment) (planning for DTR to move into house & pt move into attached apartment) in a House (mother-in-law suite apartment built onto her house - pt plans to move into the apartment and her DTR will move into the pt's house - DTR works remotely & will work in the pt's apartment with her daily) home.  Home access is 2 tallStairs to enter, Ramped entrance.  Patient will benefit from skilled PT intervention to maximize safe functional mobility, minimize fall risk, and decrease caregiver burden for planned discharge home with intermittent assist.  Anticipate patient will benefit from follow up [facility] at discharge.  PT - End of Session Activity  Tolerance: Tolerates 30+ min activity with multiple rests Endurance Deficit: Yes Endurance Deficit Description: requires seated rest breaks PT Assessment Rehab Potential (ACUTE/IP ONLY): Excellent PT Barriers to Discharge: Decreased caregiver support PT Patient demonstrates impairments in the following area(s): Balance;Perception;Safety;Sensory;Endurance;Motor;Pain PT Transfers Functional Problem(s): Bed Mobility;Bed to Chair;Car;Furniture;Floor PT Locomotion Functional Problem(s): Ambulation;Stairs PT Plan PT Intensity: Minimum of 1-2 x/day ,45 to 90 minutes PT Frequency: 5 out of 7 days PT Duration Estimated Length of Stay: ~1.5 weeks PT Treatment/Interventions: Ambulation/gait training;Community reintegration;DME/adaptive equipment instruction;Neuromuscular re-education;Psychosocial support;Stair training;UE/LE Strength taining/ROM;Balance/vestibular training;Discharge planning;Pain management;Skin care/wound management;Therapeutic Activities;UE/LE Coordination activities;Disease management/prevention;Functional mobility training;Patient/family education;Therapeutic Exercise;Visual/perceptual remediation/compensation PT Transfers Anticipated Outcome(s): mod-I using LRAD PT Locomotion Anticipated Outcome(s): mod-I using LRAD for household level mobility PT Recommendation Recommendations for Other Services: Therapeutic Recreation consult Therapeutic Recreation Interventions: Stress management;Outing/community reintergration Follow Up Recommendations: Home health PT;24 hour supervision/assistance Patient destination: Home Equipment Recommended: To be determined    PT Evaluation Precautions/Restrictions Precautions Precautions: Fall;Posterior Hip Precaution Comments: low vision, vertical diplopia, spinning sensation with mobility and at rest Restrictions Weight Bearing Restrictions Per Provider Order: No Pain Pain Assessment Pain Scale: 0-10 Pain Score: 0-No pain Pain  Interference Pain Interference Pain Effect on Sleep: 1. Rarely or not at all Pain Interference with Therapy Activities: 1. Rarely or not at all Pain Interference with Day-to-Day Activities: 1. Rarely or not at all Home Living/Prior Functioning Home Living Available Help at  Discharge: Family;Available PRN/intermittently (pt built an apartment onto her house and the pt plans to move into it & her DTR will move into the pt's house - DTR works remotely from home & will work at [employer] apartment to provide support during day) Type of Home: House (mother-in-law suite apartment built onto her house - pt plans to move into the apartment and her DTR will move into the pt's house - DTR works remotely & will work in the pt's apartment with her daily) Home Access: Stairs to enter;Ramped entrance Secretary/administrator of Steps: 2 tall Entrance Stairs-Rails: Right Home Layout: One level Additional Comments: apartment has a ramped entrance, Plans to live in the apartment and have her daughter move to her attached house. Reports no AD use in house unless really fatigued after dialysis then will use her rollator (or to carry something) and uses the cane when going out. Has a scooter to go get the mail  Lives With: Alone;Other (Comment) (planning for DTR to move into house & pt move into attached apartment) Prior Function Level of Independence: Independent with gait;Independent with transfers  Able to Take Stairs?:  (reports she has a problem with the 2 STE her house at baseline) Driving: No (due to vision) Vocation: Unemployed Vision/Perception  Vision - History Ability to See in Adequate Light: 3 Highly impaired Vision - Assessment Eye Alignment: Impaired (comment) Ocular Range of Motion: Within Functional Limits Alignment/Gaze Preference: Head turned (noticed minor R head rotation) Tracking/Visual Pursuits: Decreased smoothness of horizontal tracking;Decreased smoothness of vertical tracking (decreased  smoothness of movement in R eye in all directions) Saccades: Additional eye shifts occurred during testing (noticed "swooping" movements in R eye during vertical saccades) Convergence: Impaired (comment) (decreased convergence on L eye) Diplopia Assessment: Objects split on top of one another;Other (comment);Disappears with one eye closed (with R eye closed diplopia goes away, but the spinning of a stlil object is still present) Perception Perception: Impaired Preception Impairment Details: Spatial orientation Praxis Praxis: WFL  Cognition Overall Cognitive Status: Within Functional Limits for tasks assessed Arousal/Alertness: Awake/alert Orientation Level: Oriented X4 Year: 2024 Month: December Day of Week: Correct Attention: Focused;Sustained Focused Attention: Appears intact Sustained Attention: Appears intact Memory: Appears intact Awareness: Appears intact Problem Solving: Appears intact Safety/Judgment: Appears intact Sensation Sensation Light Touch: Impaired Detail Peripheral sensation comments: hx of peripheral neuropathy with some numbness, but able to sense light touch on test Light Touch Impaired Details: Impaired RLE;Impaired LLE Hot/Cold: Not tested Proprioception: Appears Intact Stereognosis: Not tested Coordination Gross Motor Movements are Fluid and Coordinated: No Coordination and Movement Description: impaired due to L LE incoordation and L lean with balance instability/impaired midline orientation Motor  Motor Motor: Other (comment) Motor - Skilled Clinical Observations: L LE incoordation and L lean with balance instability/impaired midline orientation   Trunk/Postural Assessment  Cervical Assessment Cervical Assessment: Exceptions to WFL (slight forward head) Thoracic Assessment Thoracic Assessment: Exceptions to [facility] (rounded shoulders) Lumbar Assessment Lumbar Assessment: Exceptions to [facility] (flexible posterior pelvic tilt) Postural Control Postural  Control: Deficits on evaluation Postural Limitations: decreased with L lean bias requiring wide BOS to maintain balance  Balance Balance Balance Assessed: Yes Standardized Balance Assessment Standardized Balance Assessment: Berg Balance Test Berg Balance Test Sit to Stand: Able to stand  independently using hands Standing Unsupported: Able to stand 2 minutes with supervision (has to maintain wide BOS to keep balance) Sitting with Back Unsupported but Feet Supported on Floor or Stool: Able to sit 2 minutes under supervision Stand to Sit:  Uses backs of legs against chair to control descent Transfers: Able to transfer with verbal cueing and /or supervision Standing Unsupported with Eyes Closed: Able to stand 10 seconds with supervision Standing Ubsupported with Feet Together: Needs help to attain position and unable to hold for 15 seconds (immediate L LOB) From Standing, Reach Forward with Outstretched Arm: Reaches forward but needs supervision From Standing Position, Pick up Object from Floor: Unable to try/needs assist to keep balance From Standing Position, Turn to Look Behind Over each Shoulder: Needs supervision when turning Turn 360 Degrees: Needs assistance while turning Standing Unsupported, Alternately Place Feet on Step/Stool: Needs assistance to keep from falling or unable to try Standing Unsupported, One Foot in Front: Loses balance while stepping or standing Standing on One Leg: Unable to try or needs assist to prevent fall Total Score: 18 Static Sitting Balance Static Sitting - Balance Support: Feet supported Static Sitting - Level of Assistance: 5: Stand by assistance;Other (comment) (CGA) Dynamic Sitting Balance Dynamic Sitting - Balance Support: Feet supported Dynamic Sitting - Level of Assistance: Other (comment);4: Min assist (CGA) Static Standing Balance Static Standing - Balance Support: During functional activity;Left upper extremity supported Static Standing - Level  of Assistance: 4: Min assist;Other (comment) (CGA) Dynamic Standing Balance Dynamic Standing - Balance Support: During functional activity;Left upper extremity supported Dynamic Standing - Level of Assistance: 4: Min assist;3: Mod assist Extremity Assessment  RLE Assessment RLE Assessment: Exceptions to [facility] Active Range of Motion (AROM) Comments: WFL/WNL General Strength Comments: assessed in sitting RLE Strength Right Hip Flexion: 4+/5 Right Knee Flexion: 5/5 Right Knee Extension: 5/5 Right Ankle Dorsiflexion: 5/5 Right Ankle Plantar Flexion: 5/5 LLE Assessment LLE Assessment: Exceptions to [facility] Active Range of Motion (AROM) Comments: WFL/WNL General Strength Comments: assessed in sitting LLE Strength Left Hip Flexion: 4+/5 Left Knee Flexion: 5/5 Left Knee Extension: 5/5 Left Ankle Dorsiflexion: 5/5 Left Ankle Plantar Flexion: 5/5  Care Tool Care Tool Bed Mobility Roll left and right activity   Roll left and right assist level: Supervision/Verbal cueing    Sit to lying activity   Sit to lying assist level: Contact Guard/Touching assist    Lying to sitting on side of bed activity   Lying to sitting on side of bed assist level: the ability to move from lying on the back to sitting on the side of the bed with no back support.: Contact Guard/Touching assist     Care Tool Transfers Sit to stand transfer   Sit to stand assist level: Minimal Assistance - Patient > 75%    Chair/bed transfer   Chair/bed transfer assist level: Minimal Assistance - Patient > 75%    Car transfer   Car transfer assist level: Minimal Assistance - Patient > 75%      Care Tool Locomotion Ambulation   Assist level: Minimal Assistance - Patient > 75% Assistive device: Hand held assist Max distance: 161ft  Walk 10 feet activity   Assist level: Minimal Assistance - Patient > 75% Assistive device: Hand held assist   Walk 50 feet with 2 turns activity   Assist level: Minimal Assistance - Patient  > 75% Assistive device: Hand held assist  Walk 150 feet activity Walk 150 feet activity did not occur: Safety/medical concerns      Walk 10 feet on uneven surfaces activity   Assist level: Minimal Assistance - Patient > 75% Assistive device: Hand held assist;Other (comment) (railing)  Stairs   Assist level: Minimal Assistance - Patient > 75% Stairs assistive device:  2 hand rails Max number of stairs: 12  Walk up/down 1 step activity   Walk up/down 1 step (curb) assist level: Minimal Assistance - Patient > 75% Walk up/down 1 step or curb assistive device: 2 hand rails  Walk up/down 4 steps activity   Walk up/down 4 steps assist level: Minimal Assistance - Patient > 75% Walk up/down 4 steps assistive device: 2 hand rails  Walk up/down 12 steps activity   Walk up/down 12 steps assist level: Minimal Assistance - Patient > 75% Walk up/down 12 steps assistive device: 2 hand rails  Pick up small objects from floor   Pick up small object from the floor assist level: Moderate Assistance - Patient 50 - 74%    Wheelchair Is the patient using a wheelchair?: Yes (to/from gym for energy conservation - uses an electric scooter for longer distances baseline) Type of Wheelchair: Manual   Wheelchair assist level: Dependent - Patient 0%    Wheel 50 feet with 2 turns activity   Assist Level: Dependent - Patient 0%  Wheel 150 feet activity   Assist Level: Dependent - Patient 0%    Refer to Care Plan for Long Term Goals  SHORT TERM GOAL WEEK 1 PT Short Term Goal 1 (Week 1): Pt will perform suipne<>sit mod-I PT Short Term Goal 2 (Week 1): Pt will perform sit<>stands using LRAD with supervision PT Short Term Goal 3 (Week 1): Pt will perform bed<>chair transfers using LRAD with supervision PT Short Term Goal 4 (Week 1): Pt will ambulate at least 182ft using LRAD with supervision  Recommendations for other services: Therapeutic Recreation  Stress management and Outing/community  reintegration  Skilled Therapeutic Intervention Pt received sitting in recliner and agreeable to therapy session. Evaluation completed (see details above) with patient education regarding purpose of PT evaluation, PT POC and goals, therapy schedule, weekly team meetings, and other CIR information including safety plan and fall risk safety. Pt performed the below functional mobility tasks with the specified levels of skilled cuing and assistance. Pt often keeps either both or R eye closed due to vertical diplopia and persistent spinning sensation. Noticed to have primarily L lateral LOB requiring assistance on that side during mobility. Patient participated in [facility] and demonstrates increased fall risk as noted by score of   18/56.  (<36= high risk for falls, close to 100%; 37-45 significant >80%; 46-51 moderate >50%; 52-55 lower >25%). At end of session, pt left seated in recliner with needs in reach and chair alarm on.    Mobility Bed Mobility Bed Mobility: Supine to Sit;Sit to Supine Supine to Sit: Supervision/Verbal cueing Sit to Supine: Supervision/Verbal cueing Transfers Transfers: Sit to Stand;Stand to Sit;Stand Pivot Transfers Sit to Stand: Minimal Assistance - Patient > 75%;Contact Guard/Touching assist Stand to Sit: Contact Guard/Touching assist;Minimal Assistance - Patient > 75% Stand Pivot Transfers: Minimal Assistance - Patient > 75% Stand Pivot Transfer Details: Verbal cues for safe use of DME/AE;Verbal cues for technique;Visual cues/gestures for sequencing;Tactile cues for weight shifting;Tactile cues for sequencing;Verbal cues for sequencing;Verbal cues for precautions/safety Transfer (Assistive device): Rolling walker (vs no AD) Locomotion  Gait Ambulation: Yes Gait Assistance: Minimal Assistance - Patient > 75% Gait Distance (Feet): 140 Feet Assistive device: 1 person hand held assist (L HHA) Gait Assistance Details: Verbal cues for precautions/safety;Verbal cues  for technique;Verbal cues for sequencing;Verbal cues for gait pattern;Tactile cues for sequencing;Tactile cues for weight shifting Gait Gait: Yes Gait Pattern: Impaired Gait Pattern: Wide base of support;Step-through pattern;Lateral trunk lean to  right;Lateral trunk lean to left (R and L lateral sway/slight LOB) Gait velocity: decreased Stairs / Additional Locomotion Stairs: Yes Stairs Assistance: Contact Guard/Touching assist;Minimal Assistance - Patient > 75% Stair Management Technique: Two rails;Alternating pattern;Step to pattern;Forwards (started with step-to pattern progressed to reciprocal in both directions) Number of Stairs: 12 Height of Stairs: 6 Ramp: Minimal Assistance - Patient >75% Curb: Minimal Assistance - Patient >75% Wheelchair Mobility Wheelchair Mobility: No   Discharge Criteria: Patient will be discharged from PT if patient refuses treatment 3 consecutive times without medical reason, if treatment goals not met, if there is a change in medical status, if patient makes no progress towards goals or if patient is discharged from hospital.  The above assessment, treatment plan, treatment alternatives and goals were discussed and mutually agreed upon: by patient  Ginny Forth , PT, DPT, NCS, CSRS 12/03/2023, 3:00 PM

## 2023-12-03 NOTE — Evaluation (Signed)
Speech Language Pathology Assessment and Plan  Patient Details  Name: Sharon Arias MRN: [id number] Date of Birth: Jan 31, 1961  SLP Diagnosis: Dysarthria  Rehab Potential: Excellent ELOS: 10-14 days (less for speech)   Today's Date: 12/03/2023 SLP Individual Time: 1105-1200 SLP Individual Time Calculation (min): 55 min  Hospital Problem: Principal Problem:   Ischemic cerebrovascular accident (CVA) (HCC)  Past Medical History:  Past Medical History:  Diagnosis Date   Anemia in chronic kidney disease 05/19/2016   Anxiety    Aortic atherosclerosis (HCC)    Blind    CAD (coronary artery disease)    Cardiac murmur    CHF (congestive heart failure) (HCC)    G2DD on 02/2020 TTE   Chicken pox    Chronic anticoagulation    CVA (cerebral vascular accident) (HCC)    DDD (degenerative disc disease), lumbar    Detached retina    Diabetic neuropathy (HCC)    Diabetic retinopathy (HCC)    legally blind   Dyspnea    ESRD needing dialysis (HCC)    Hx of CABG    Hyperlipidemia    Hypertension    NSTEMI (non-ST elevated myocardial infarction) (HCC) 06/2012   Obesity    Pneumonia    PONV (postoperative nausea and vomiting)    Sciatica of right side    Sleep apnea    CPAP NIGHTLY   T2DM (type 2 diabetes mellitus) (HCC)    Past Surgical History:  Past Surgical History:  Procedure Laterality Date   A/V FISTULAGRAM Left 04/21/2021   Procedure: A/V FISTULAGRAM;  Surgeon: Renford Dills, MD;  Location: [facility];  Service: Cardiovascular;  Laterality: Left;   A/V FISTULAGRAM Left 11/02/2022   Procedure: A/V Fistulagram;  Surgeon: Renford Dills, MD;  Location: [facility];  Service: Cardiovascular;  Laterality: Left;   AV FISTULA PLACEMENT Left 02/06/2021   Procedure: ARTERIOVENOUS (AV) FISTULA CREATION (BRACHIAL CEPHALIC );  Surgeon: Renford Dills, MD;  Location: [facility] ORS;  Service: Vascular;  Laterality: Left;   COLONOSCOPY WITH PROPOFOL N/A 01/26/2016    Procedure: COLONOSCOPY WITH PROPOFOL;  Surgeon: Christena Deem, MD;  Location: [facility] ENDOSCOPY;  Service: Endoscopy;  Laterality: N/A;   COLONOSCOPY WITH PROPOFOL N/A 01/27/2016   Procedure: COLONOSCOPY WITH PROPOFOL;  Surgeon: Christena Deem, MD;  Location: [facility] ENDOSCOPY;  Service: Endoscopy;  Laterality: N/A;   CORONARY ARTERY BYPASS GRAFT  2013   DIALYSIS/PERMA CATHETER INSERTION N/A 02/03/2021   Procedure: DIALYSIS/PERMA CATHETER INSERTION;  Surgeon: Renford Dills, MD;  Location: [facility];  Service: Cardiovascular;  Laterality: N/A;   DIALYSIS/PERMA CATHETER INSERTION N/A 06/16/2021   Procedure: DIALYSIS/PERMA CATHETER INSERTION;  Surgeon: Annice Needy, MD;  Location: [facility];  Service: Cardiovascular;  Laterality: N/A;   DIALYSIS/PERMA CATHETER REMOVAL N/A 09/22/2021   Procedure: DIALYSIS/PERMA CATHETER REMOVAL;  Surgeon: Renford Dills, MD;  Location: [facility];  Service: Cardiovascular;  Laterality: N/A;   INCISION AND DRAINAGE     chest abscess   INCISION AND DRAINAGE ABSCESS N/A 12/11/2018   Procedure: INCISION AND DRAINAGE PERINEAL;  Surgeon: Leafy Ro, MD;  Location: [facility] ORS;  Service: General;  Laterality: N/A;   RETINAL LASER PROCEDURE      Assessment / Plan / Recommendation Clinical Impression Patient is a 62 y.o. right-handed female with history of CVA without residual deficit, obesity with BMI 39.50, OSA with CPAP, hypertension, anemia of chronic disease, diastolic congestive heart failure, end-stage renal disease with  hemodialysis, diabetes mellitus with neuropathy as well as retinopathy and legally blind, CAD with CABG 2013 maintained on aspirin as well as Plavix. Per chart review patient lives alone. 1 level home 2 steps to entry. Independent with mobility without assistive device. She does not drive. Her groceries are delivered. Presented to [facility] 11/26/2023 with progressive dizziness that started during a session of her  hemodialysis as well as blurred vision with headache. MRI/MRA of the head showed acute infarct in the brachium pontis on the left. Severe narrowing in the ophthalmic segment of the right ICA. Admission chemistries unremarkable except WBC 13,100, BUN 36, creatinine 3.52, hemoglobin A1c 8.1, urine drug screen negative. Echocardiogram with ejection fraction of 55 to 60% no wall motion abnormalities. Neurology follow-up patient currently remains on low-dose aspirin and Plavix for CVA prophylaxis. Subcutaneous heparin added for DVT prophylaxis. Tolerating a regular consistency diet. Therapy evaluations completed due to patient decreased functional mobility was admitted for a comprehensive rehab program.   Patient transferred to CIR on 12/02/2023 .   Pt seen for cognitive-linguistic evaluation, awake and alert however requiring eyes closed throughout session 2' significant dizziness. 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 Pt presents with fully intelligble speech at conversation level but mild dysarthria related to left side facial weakness and decreased sensation. Pt informally observed at meal time and is consuming a regular/thin diet without difficulty and is able to compensate for L weakness. Pt does endorse only taking one pill at a time vs all pills at once prior to CVA.   Pt participating in Short Blessed Test with a score of 28/28, no errors. Pt demonstrating understanding and carryover of education re: speech intelligibility strategies and safe swallow recommendations. Pt would benefit from 1-2 more ST sessions with focus on education and training in intelligibility strategies.   Skilled Therapeutic Interventions          Pt participating in [employer] as well as informal and nonstandardized assessments of speech, language, cognition and swallowing. Please see above.   SLP  Assessment  Patient will need skilled Speech Lanaguage Pathology Services during CIR admission    Recommendations  SLP Diet Recommendations: Age appropriate regular solids Liquid Administration via: Straw Medication Administration: Whole meds with liquid Supervision: Patient able to self feed Compensations: Lingual sweep for clearance of pocketing;Monitor for anterior loss Postural Changes and/or Swallow Maneuvers: Seated upright 90 degrees Oral Care Recommendations: Oral care BID Patient destination: Home Follow up Recommendations: None Equipment Recommended: None recommended by SLP    SLP Frequency 1 to 3 out of 7 days   SLP Duration  SLP Intensity  SLP Treatment/Interventions 10-14 days (less for speech)  Minumum of 1-2 x/day, 30 to 90 minutes  Speech/Language facilitation;Patient/family education    Pain Pain Assessment Pain Scale: 0-10 Pain Score: 0-No pain Pain Type: Chronic pain Pain Location: Back Pain Descriptors / Indicators: Aching;Pins and needles Pain Onset: On-going Patients Stated Pain Goal: 0  Prior Functioning Cognitive/Linguistic Baseline: Within functional limits Type of Home: House  Lives With: Alone Available Help at Discharge: Available PRN/intermittently;Family Education: 2 years college Vocation: Unemployed  SLP Evaluation Cognition Overall Cognitive Status: Within Functional Limits for tasks assessed Arousal/Alertness: Awake/alert Orientation Level: Oriented X4 Year: 2024 Month: December Day of Week: Correct Memory: Appears intact Awareness: Appears intact Problem Solving: Appears intact Safety/Judgment: Appears intact  Comprehension Auditory Comprehension Overall Auditory Comprehension: Appears within functional limits  for tasks assessed Expression Expression Primary Mode of Expression: Verbal Verbal Expression Overall Verbal Expression: Appears within functional limits for tasks assessed Written Expression Dominant Hand:  Right Oral Motor Oral Motor/Sensory Function Overall Oral Motor/Sensory Function: Mild impairment Facial ROM: Reduced left Facial Symmetry: Abnormal symmetry left Facial Strength: Reduced left Facial Sensation: Reduced left Lingual ROM: Within Functional Limits Lingual Symmetry: Within Functional Limits Lingual Strength: Within Functional Limits Lingual Sensation: Within Functional Limits Mandible: Within Functional Limits Motor Speech Overall Motor Speech: Impaired Respiration: Within functional limits Phonation: Normal Resonance: Within functional limits Articulation: Impaired Level of Impairment: Conversation Intelligibility: Intelligible Motor Planning: Witnin functional limits Motor Speech Errors: Not applicable Effective Techniques: Slow rate;Over-articulate  Care Tool Care Tool Cognition Ability to hear (with hearing aid or hearing appliances if normally used Ability to hear (with hearing aid or hearing appliances if normally used): 0. Adequate - no difficulty in normal conservation, social interaction, listening to TV   Expression of Ideas and Wants Expression of Ideas and Wants: 4. Without difficulty (complex and basic) - expresses complex messages without difficulty and with speech that is clear and easy to understand   Understanding Verbal and Non-Verbal Content Understanding Verbal and Non-Verbal Content: 4. Understands (complex and basic) - clear comprehension without cues or repetitions  Memory/Recall Ability Memory/Recall Ability : Current season;That he or she is in a hospital/hospital unit   Short Term Goals: Week 1: SLP Short Term Goal 1 (Week 1): STGs = LTGs d/t short ST rehab course  Refer to Care Plan for Long Term Goals  Recommendations for other services: None   Discharge Criteria: Patient will be discharged from SLP if patient refuses treatment 3 consecutive times without medical reason, if treatment goals not met, if there is a change in medical  status, if patient makes no progress towards goals or if patient is discharged from hospital.  The above assessment, treatment plan, treatment alternatives and goals were discussed and mutually agreed upon: by patient  Tacey Ruiz 12/03/2023, 12:39 PM

## 2023-12-03 NOTE — Plan of Care (Signed)
  Problem: RH Expression Communication Goal: LTG Patient will increase speech intelligibility (SLP) Description: LTG: Patient will increase speech intelligibility at word/phrase/conversation level with cues, % of the time (SLP) Flowsheets (Taken 12/03/2023 1258) LTG: Patient will increase speech intelligibility (SLP): Modified Independent Level: Conversation level Percent of time patient will use intelligible speech: 100

## 2023-12-03 NOTE — Consult Note (Signed)
Renal Service Consult Note [facility]  Sharon Arias 12/03/2023 [name], MD Requesting Physician: Dr. Wynn Banker  Reason for Consult: ESRD pt  HPI: The patient is a 62 y.o. year-old w/ PMH as below who presented w/ dizziness and blurred vision w/ headache to OSH [facility]). MRI showed acute CVA of the L brachium pontis. ECHO was negative. Neuro saw and she was given asa/ plavix. PT evals suggested she was benefit from comprehensive rehab and was transferred to [facility] rehab unit yesterday evening. Pt is ESRD on HD MWF. Had last HD yesterday. We are asked to see for ESRD.    Pt seen in room. Main issue w/ CVA is new facial droop. Here "spinning" has stopped and she is left now w/ triple or quadruple vision. She looks ahead and sees 8 copies of who she is talking to. No SOB, no cough or fevers. No recent HD issues. She gets HD at [facility] on [address] in [place] w/ Delena Serve. She reports lots of N/V prior to OSH admission and was initially 5 kg under her dry wt. The last 2 HD sessions they didn't pull any fluid per the patient.   Has long-term bilat lower leg discoloration which predated dialysis. Has been on HD about 3 years.    ROS - denies CP, no joint pain, no HA, no blurry vision, no rash, no diarrhea, no nausea/ vomiting, no dysuria, no difficulty voiding   Past Medical History  Past Medical History:  Diagnosis Date   Anemia in chronic kidney disease 05/19/2016   Anxiety    Aortic atherosclerosis (HCC)    Blind    CAD (coronary artery disease)    Cardiac murmur    CHF (congestive heart failure) (HCC)    G2DD on 02/2020 TTE   Chicken pox    Chronic anticoagulation    CVA (cerebral vascular accident) (HCC)    DDD (degenerative disc disease), lumbar    Detached retina    Diabetic neuropathy (HCC)    Diabetic retinopathy (HCC)    legally blind   Dyspnea    ESRD needing dialysis (HCC)    Hx of CABG    Hyperlipidemia    Hypertension    NSTEMI (non-ST elevated  myocardial infarction) (HCC) 06/2012   Obesity    Pneumonia    PONV (postoperative nausea and vomiting)    Sciatica of right side    Sleep apnea    CPAP NIGHTLY   T2DM (type 2 diabetes mellitus) [facility])    Past Surgical History  Past Surgical History:  Procedure Laterality Date   A/V FISTULAGRAM Left 04/21/2021   Procedure: A/V FISTULAGRAM;  Surgeon: Renford Dills, MD;  Location: [facility];  Service: Cardiovascular;  Laterality: Left;   A/V FISTULAGRAM Left 11/02/2022   Procedure: A/V Fistulagram;  Surgeon: Renford Dills, MD;  Location: [facility];  Service: Cardiovascular;  Laterality: Left;   AV FISTULA PLACEMENT Left 02/06/2021   Procedure: ARTERIOVENOUS (AV) FISTULA CREATION (BRACHIAL CEPHALIC );  Surgeon: Renford Dills, MD;  Location: [facility] ORS;  Service: Vascular;  Laterality: Left;   COLONOSCOPY WITH PROPOFOL N/A 01/26/2016   Procedure: COLONOSCOPY WITH PROPOFOL;  Surgeon: Christena Deem, MD;  Location[facility] ENDOSCOPY;  Service: Endoscopy;  Laterality: N/A;   COLONOSCOPY WITH PROPOFOL N/A 01/27/2016   Procedure: COLONOSCOPY WITH PROPOFOL;  Surgeon: Christena Deem, MD;  Location: [facility] ENDOSCOPY;  Service: Endoscopy;  Laterality: N/A;   CORONARY ARTERY BYPASS GRAFT  2013  DIALYSIS/PERMA CATHETER INSERTION N/A 02/03/2021   Procedure: DIALYSIS/PERMA CATHETER INSERTION;  Surgeon: Renford Dills, MD;  Location: [facility];  Service: Cardiovascular;  Laterality: N/A;   DIALYSIS/PERMA CATHETER INSERTION N/A 06/16/2021   Procedure: DIALYSIS/PERMA CATHETER INSERTION;  Surgeon: Annice Needy, MD;  Location: [facility];  Service: Cardiovascular;  Laterality: N/A;   DIALYSIS/PERMA CATHETER REMOVAL N/A 09/22/2021   Procedure: DIALYSIS/PERMA CATHETER REMOVAL;  Surgeon: Renford Dills, MD;  Location: [facility];  Service: Cardiovascular;  Laterality: N/A;   INCISION AND DRAINAGE     chest abscess   INCISION AND DRAINAGE ABSCESS  N/A 12/11/2018   Procedure: INCISION AND DRAINAGE PERINEAL;  Surgeon: Leafy Ro, MD;  Location: [facility] ORS;  Service: General;  Laterality: N/A;   RETINAL LASER PROCEDURE     Family History  Family History  Problem Relation Age of Onset   Breast cancer Mother    Prostate cancer Father        we think mets to liver and bone   Social History  reports that she has never smoked. She has never been exposed to tobacco smoke. She has never used smokeless tobacco. She reports that she does not drink alcohol and does not use drugs. Allergies  Allergies  Allergen Reactions   Ozempic (0.25 Or 0.5 Mg-Dose) [Semaglutide(0.25 Or 0.5mg -Dos)] Diarrhea and Nausea Only   Trulicity [Dulaglutide] Diarrhea and Nausea Only   Other     Anesthesia--nausea/vomiting   Home medications Prior to Admission medications   Medication Sig Start Date End Date Taking? Authorizing Provider  allopurinol (ZYLOPRIM) 300 MG tablet Take 300 mg by mouth in the morning. 02/09/21   [provider]  amLODipine (NORVASC) 5 MG tablet Take 5 mg by mouth at bedtime. 12/04/18   [provider]  aspirin EC 81 MG tablet Take 81 mg by mouth in the morning.    [provider]  atorvastatin (LIPITOR) 80 MG tablet Take 1 tablet (80 mg total) by mouth daily. 12/02/23   Marcelino Duster, MD  clopidogrel (PLAVIX) 75 MG tablet Take 1 tablet (75 mg total) by mouth daily. Patient taking differently: Take 75 mg by mouth in the morning. 07/23/19   Altamese Dilling, MD  furosemide (LASIX) 40 MG tablet Take 40 mg by mouth daily. Take one tablet daily on non-dialysis days. 10/28/21   [provider]  hydrALAZINE (APRESOLINE) 50 MG tablet Take 50 mg by mouth in the morning and at bedtime. 08/21/20   [provider]  insulin aspart (NOVOLOG) 100 UNIT/ML injection Inject 20 Units into the skin 3 (three) times daily with meals. 12/02/23   Marcelino Duster, MD  insulin aspart (NOVOLOG) 100 UNIT/ML  injection Inject 0-9 Units into the skin 3 (three) times daily with meals. Correction coverage: Sensitive (thin, NPO, renal) CBG < 70: Implement Hypoglycemia Standing Orders and refer to Hypoglycemia Standing Orders sidebar report CBG 70 - 120: 0 units CBG 121 - 150: 1 unit CBG 151 - 200: 2 units CBG 201 - 250: 3 units CBG 251 - 300: 5 units CBG 301 - 350: 7 units CBG 351 - 400: 9 units CBG > 400: call MD and obtain STAT lab verification 12/02/23   Marcelino Duster, MD  insulin aspart (NOVOLOG) 100 UNIT/ML injection Inject 0-5 Units into the skin at bedtime. Correction coverage: HS scale CBG < 70: Implement Hypoglycemia Standing Orders and refer to Hypoglycemia Standing Orders sidebar report CBG 70 - 120: 0 units CBG 121 - 150:  0 units CBG 151 - 200: 0 units CBG 201 - 250: 2 units CBG 251 - 300: 3 units CBG 301 - 350: 4 units CBG 351 - 400: 5 units CBG > 400: call MD and obtain STAT lab verification 12/02/23   Marcelino Duster, MD  insulin glargine-yfgn (SEMGLEE) 100 UNIT/ML injection Inject 0.6 mLs (60 Units total) into the skin at bedtime. 12/02/23   Marcelino Duster, MD  lidocaine-prilocaine (EMLA) cream Apply 1 application topically as needed (prior to dialysis). 05/01/21   [provider]  losartan (COZAAR) 100 MG tablet Take 100 mg by mouth at bedtime.    [provider]  meclizine (ANTIVERT) 25 MG tablet Take 1 tablet (25 mg total) by mouth 3 (three) times daily as needed for dizziness. 03/26/23   Merwyn Katos, MD  metoprolol tartrate (LOPRESSOR) 50 MG tablet Take 50 mg by mouth 2 (two) times daily.     [provider]  multivitamin (RENA-VIT) TABS tablet Take 1 tablet by mouth at bedtime. 02/18/21   [provider]  Nutritional Supplements (,FEEDING SUPPLEMENT, PROSOURCE PLUS) liquid Take 30 mLs by mouth 2 (two) times daily between meals. 12/02/23   Marcelino Duster, MD  ondansetron (ZOFRAN-ODT) 8 MG disintegrating tablet Take 1 tablet  (8 mg total) by mouth every 8 (eight) hours as needed for nausea or vomiting. 03/26/23   Merwyn Katos, MD  ONETOUCH VERIO test strip  01/02/21   [provider]  rosuvastatin (CRESTOR) 40 MG tablet Take 40 mg by mouth daily.    [provider]  sevelamer carbonate (RENVELA) 800 MG tablet Take 1 tablet (800 mg total) by mouth 3 (three) times daily. 12/02/23   Marcelino Duster, MD  Vitamin D, Ergocalciferol, (DRISDOL) 1.25 MG (50000 UNIT) CAPS capsule Take 50,000 Units by mouth every 7 (seven) days. 10/11/23   [provider]     Vitals:   12/02/23 1939 12/02/23 2152 12/03/23 0452 12/03/23 1304  BP: (!) 157/56  (!) 140/91 (!) 124/52  Pulse: 68 70 (!) 56 (!) 57  Resp: 16 16 18 16   Temp: 98.4 F (36.9 C)  97.6 F (36.4 C) 97.9 F (36.6 C)  TempSrc: Oral  Oral Oral  SpO2: 96% 99% 99% 95%  Weight:      Height:       Exam Gen alert, no distress No rash, cyanosis or gangrene Sclera anicteric, throat clear  No jvd or bruits Chest clear bilat to bases, no rales/ wheezing RRR no RG Abd soft ntnd no mass or ascites +bs GU defer MS no joint effusions or deformity Ext no LE edema, discoloration of lower legs bilat Neuro is alert, Ox 3 , nf    LUA AVF+bruit     Renal-related home meds: - norvasc, hydralazine, losartan, metoprolol - renvela 1 ac tid - lasix 40mg  every day on non hd days - others: zyloprim, insulins, asa/ plavix, statin    OP HD: MWF [facility]    115kg dry wt    Assessment/ Plan: Debility - post acute CVA w/ double vision   ESKD - on HD MWF. Next HD Monday.  HTN - taking 4 htn meds at home and here.  Volume - no vol excess, prob has lost some body wt from this. 2.5kg under today.  Anemia of eskd - Hb 11-12, no esa needs.  MBD ckd - CCa and phos are in range. Cont binders w/ meals.  DM - insulin per pmd CAD h/o CABG (2013)  Vinson Moselle  MD [facility] 12/03/2023, 8:02 PM  Recent Labs  Lab 11/28/23 0900  11/30/23 1044 12/02/23 1521  HGB 11.4* 11.6* 12.4  ALBUMIN 3.7 3.6  --   CALCIUM 9.4 9.3  --   PHOS 4.9* 5.9*  --   CREATININE 4.11* 4.63* 2.92*  K 3.8 4.0  --    Inpatient medications:  (feeding supplement) PROSource Plus  30 mL Oral BID BM   allopurinol  300 mg Oral Daily   amLODipine  5 mg Oral QHS   aspirin  81 mg Oral Daily   atorvastatin  80 mg Oral Daily   clopidogrel  75 mg Oral Daily   furosemide  40 mg Oral Q T,Th,S,Su   heparin  5,000 Units Subcutaneous Q8H   hydrALAZINE  50 mg Oral BID   insulin aspart  0-9 Units Subcutaneous TID WC   insulin aspart  20 Units Subcutaneous TID WC   insulin glargine-yfgn  63 Units Subcutaneous QHS   losartan  100 mg Oral Daily   metoprolol tartrate  50 mg Oral BID   multivitamin  1 tablet Oral QHS   sevelamer carbonate  800 mg Oral TID WC    acetaminophen **OR** acetaminophen (TYLENOL) oral liquid 160 mg/5 mL **OR** acetaminophen, [START ON 12/05/2023] heparin, [START ON 12/05/2023] lidocaine-prilocaine, meclizine

## 2023-12-03 NOTE — Plan of Care (Signed)
  Problem: Sit to Stand Goal: LTG:  Patient will perform sit to stand in prep for activites of daily living with assistance level (OT) Description: LTG:  Patient will perform sit to stand in prep for activites of daily living with assistance level (OT) Flowsheets (Taken 12/03/2023 0946) LTG: PT will perform sit to stand in prep for activites of daily living with assistance level: Independent with assistive device   Problem: RH Grooming Goal: LTG Patient will perform grooming w/assist,cues/equip (OT) Description: LTG: Patient will perform grooming with assist, with/without cues using equipment (OT) Flowsheets (Taken 12/03/2023 0946) LTG: Pt will perform grooming with assistance level of: Independent with assistive device    Problem: RH Bathing Goal: LTG Patient will bathe all body parts with assist levels (OT) Description: LTG: Patient will bathe all body parts with assist levels (OT) Flowsheets (Taken 12/03/2023 0946) LTG: Pt will perform bathing with assistance level/cueing: Independent with assistive device  LTG: Position pt will perform bathing: Shower   Problem: RH Bathing Goal: LTG Patient will bathe all body parts with assist levels (OT) Description: LTG: Patient will bathe all body parts with assist levels (OT) Flowsheets (Taken 12/03/2023 0946) LTG: Pt will perform bathing with assistance level/cueing: Independent with assistive device  LTG: Position pt will perform bathing: Shower   Problem: RH Bathing Goal: LTG Patient will bathe all body parts with assist levels (OT) Description: LTG: Patient will bathe all body parts with assist levels (OT) Flowsheets (Taken 12/03/2023 0946) LTG: Pt will perform bathing with assistance level/cueing: Independent with assistive device  LTG: Position pt will perform bathing: Shower   Problem: RH Dressing Goal: LTG Patient will perform upper body dressing (OT) Description: LTG Patient will perform upper body dressing with assist,  with/without cues (OT). Flowsheets (Taken 12/03/2023 0946) LTG: Pt will perform upper body dressing with assistance level of: Independent with assistive device Goal: LTG Patient will perform lower body dressing w/assist (OT) Description: LTG: Patient will perform lower body dressing with assist, with/without cues in positioning using equipment (OT) Flowsheets (Taken 12/03/2023 0946) LTG: Pt will perform lower body dressing with assistance level of: Independent with assistive device   Problem: RH Toileting Goal: LTG Patient will perform toileting task (3/3 steps) with assistance level (OT) Description: LTG: Patient will perform toileting task (3/3 steps) with assistance level (OT)  Flowsheets (Taken 12/03/2023 0946) LTG: Pt will perform toileting task (3/3 steps) with assistance level: Independent with assistive device

## 2023-12-03 NOTE — Progress Notes (Signed)
PROGRESS NOTE   Subjective/Complaints:  Pt reports is legally blind- cannot use female urinal, but very upset that HD was affected by needing to pee yesterday and have BM earlier in week and they had no way/facilities to help her do either.   Slept OK.  Denied Lasix, Renvela and her breakfast today. Is emphatic "doesn't want to pee all day".  Has significant urgency with bowel and bladder.  Also reports room spinning, but her strength is "pretty good".   Admits not drinking because tired of peeing.     ROS:  Pt denies SOB, abd pain, CP, N/V/C/D, and vision changes  Negative except for HPI-   Objective:   No results found. Recent Labs    12/02/23 1521  WBC 9.6  HGB 12.4  HCT 36.8  PLT 244   Recent Labs    12/02/23 1521  CREATININE 2.92*    Intake/Output Summary (Last 24 hours) at 12/03/2023 1423 Last data filed at 12/02/2023 1800 Gross per 24 hour  Intake 237 ml  Output --  Net 237 ml        Physical Exam: Vital Signs Blood pressure (!) 124/52, pulse (!) 57, temperature 97.9 F (36.6 C), temperature source Oral, resp. rate 16, height 5\' 7"  (1.702 m), weight 112.4 kg, SpO2 95%.     General: awake, alert, appropriate, sitting up in bed; no meal there; NAD HENT: conjugate gaze; oropharynx dry- staring into space due to legal blindness CV: regular to borderline bradycardic  rate; no JVD Pulmonary: CTA B/L; no W/R/R- good air movement GI: soft, NT, ND, (+)BS- hypoactive Psychiatric: appropriate Neurological: Ox3- emphatic /perseverative about urinary urgency since had stroke.  Neurologic exam:  Cognition: AAO to person, place, time and event.  Language: Mild dysarthria. Names 3/3 objects correctly.  Memory: Recalls 3/3 objects at 5 minutes. No apparent deficits  Insight: Good  insight into current condition.  Mood: Pleasant affect, appropriate mood.  Sensation: To light touch intact in BL UEs and  LEs  Reflexes: 2+ in BL UE and LEs. Negative Hoffman's and babinski signs bilaterally.  CN:+ L facial droop Coordination:+ Ataxia LUE, LLE Spasticity: MAS 0 in all extremities.  Strength:                RUE: 5/5 SA, 5/5 EF, 5/5 EE, 5/5 WE, 5/5 FF, 5/5 FA                 LUE: 4/5 SA, 5-/5 EF, 5-/5 EE, 5-/5 WE, 5-/5 FF, 5-/5 FA                 RLE: 5/5 HF, 5/5 KE, 5/5 DF, 5/5 EHL, 5/5 PF                 LLE:  5-/5 HF, 5-/5 KE, 5/5 DF, 5/5 EHL, 5/5 PF       Assessment/Plan: 1. Functional deficits which require 3+ hours per day of interdisciplinary therapy in a comprehensive inpatient rehab setting. Physiatrist is providing close team supervision and 24 hour management of active medical problems listed below. Physiatrist and rehab team continue to assess barriers to discharge/monitor patient progress toward functional and medical goals  Care  Tool:  Bathing    Body parts bathed by patient: Right arm, Left upper leg, Right lower leg, Left arm, Chest, Left lower leg, Abdomen, Face, Front perineal area, Buttocks, Right upper leg         Bathing assist Assist Level: Contact Guard/Touching assist     Upper Body Dressing/Undressing Upper body dressing   What is the patient wearing?: Pull over shirt    Upper body assist Assist Level: Set up assist    Lower Body Dressing/Undressing Lower body dressing      What is the patient wearing?: Underwear/pull up, Pants     Lower body assist Assist for lower body dressing: Moderate Assistance - Patient 50 - 74%     Toileting Toileting    Toileting assist Assist for toileting: Contact Guard/Touching assist     Transfers Chair/bed transfer  Transfers assist     Chair/bed transfer assist level: Contact Guard/Touching assist     Locomotion Ambulation   Ambulation assist              Walk 10 feet activity   Assist           Walk 50 feet activity   Assist           Walk 150 feet activity   Assist            Walk 10 feet on uneven surface  activity   Assist           Wheelchair     Assist               Wheelchair 50 feet with 2 turns activity    Assist            Wheelchair 150 feet activity     Assist          Blood pressure (!) 124/52, pulse (!) 57, temperature 97.9 F (36.6 C), temperature source Oral, resp. rate 16, height 5\' 7"  (1.702 m), weight 112.4 kg, SpO2 95%.  Medical Problem List and Plan: 1. Functional deficits secondary to ischemic infarct left brachium pontis secondary to small vessel disease             -patient may  shower             -ELOS/Goals: 10-14 days, supervision PT, supervision OT, modified independent SLP               - Stable for admission to IRF   Admit for CIR- Con't CIR PT, and OT 2.  Antithrombotics: -DVT/anticoagulation:  Pharmaceutical: Heparin             -antiplatelet therapy: Aspirin 81 mg daily and Plavix 75 mg daily as prior to admission 3. Pain Management: Tylenol as needed  12/14- denies pain- con't tlyenol prn 4. Mood/Behavior/Sleep: Provide emotional support             -antipsychotic agents: N/A 5. Neuropsych/cognition: This patient is capable of making decisions on her own behalf. 6. Skin/Wound Care: Routine skin checks 7. Fluids/Electrolytes/Nutrition: Routine in and outs with follow-up chemistries 8.  End-stage renal disease.  Continue hemodialysis as directed- has oliguria  9.  CAD with CABG 2013.  No chest pain or shortness of breath.  Continue aspirin and Plavix 10.  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  Hemoglobin A1c 8.1. Novolog 20 units 3 times daily with meals. Semglee 60 units nightly.  Check blood sugars before meals and at bedtime  12/14- 172-to 272- will increase  Semglee to 63 units- suggest breaking dose up to BID 11.  Hypertension.  Cozaar 100 mg daily, hydralazine 50 mg twice daily, Norvasc 5 mg daily, Lopressor 50 mg twice daily.  Monitor with increased  mobility  12/14- BP is well controlled- con't regimen 12.  Hyperlipidemia.  Lipitor 13.  Diastolic congestive heart failure.  Lasix 40 mg Tuesday Thursday Saturday Sunday.  Monitor for any signs of fluid overload  12/14- pt refusing Lasix since doesn't want to pee a lot, but is also not drinking hardly at all- is purposefully dehydrating self so doesn't have to pee. Have encouraged pt to drink.  14.  History of gout.  Zyloprim 300 mg daily.  Monitor for any gout flares 15.Obesity.  BMI 39.50.  Dietary follow-up  12/14- BMI 38.81 - cont' to monitor and might be worth a dietitian consult 16.  Anemia of chronic disease.  Follow-up CBC.  12/14- Hb 12.4- doing well- con't to monitor    I spent a total of 42   minutes on total care today- >50% coordination of care- due to  D/w nursing x2 about pt's refusals of meds and food and drink-0 also spoke to her twice about drinking and bowel issues- esp with HD.     LOS: 1 days A FACE TO FACE EVALUATION WAS PERFORMED  Keyonna Comunale 12/03/2023, 2:23 PM

## 2023-12-04 DIAGNOSIS — I639 Cerebral infarction, unspecified: Secondary | ICD-10-CM | POA: Diagnosis not present

## 2023-12-04 LAB — GLUCOSE, CAPILLARY
Glucose-Capillary: 173 mg/dL — ABNORMAL HIGH (ref 70–99)
Glucose-Capillary: 278 mg/dL — ABNORMAL HIGH (ref 70–99)
Glucose-Capillary: 208 mg/dL — ABNORMAL HIGH (ref 70–99)
Glucose-Capillary: 164 mg/dL — ABNORMAL HIGH (ref 70–99)

## 2023-12-04 LAB — HEPATITIS B SURFACE ANTIGEN: Hepatitis B Surface Ag: NONREACTIVE

## 2023-12-04 MED ORDER — SORBITOL 70 % SOLN
30.0000 mL | Freq: Once | Status: AC
  Administered 2023-12-04: 30 mL via ORAL
  Filled 2023-12-04: qty 30

## 2023-12-04 NOTE — Progress Notes (Addendum)
[facility] Progress Note   Subjective: Sitting up on side of bed with eyes closed. Says that if she keeps her eyes closed the dizziness and double vision doesn't bother her as badly. Pleasant, denies SOB.    Objective Vitals:   12/03/23 2205 12/03/23 2222 12/04/23 0533 12/04/23 0855  BP:  (!) 132/50 (!) 141/42 (!) 142/49  Pulse: (!) 55 61 (!) 58 62  Resp: 12 18 18    Temp:  97.7 F (36.5 C) 97.7 F (36.5 C)   TempSrc:      SpO2: 100% 100% 97%   Weight:      Height:       Physical Exam General: Obese female in NAD Heart: S1,S2 RRR No M/R/G Lungs: CTAB A/P Abdomen: Obese, NABS Extremities: No LE edema. Hypopigmentation of BLE  Dialysis Access: L AVF + T/B    Additional Objective Labs: Basic Metabolic Panel: Recent Labs  Lab 11/28/23 0900 11/30/23 1044 12/02/23 1521  NA 138 138  --   K 3.8 4.0  --   CL 104 104  --   CO2 22 21*  --   GLUCOSE 164* 157*  --   BUN 51* 62*  --   CREATININE 4.11* 4.63* 2.92*  CALCIUM 9.4 9.3  --   PHOS 4.9* 5.9*  --    Liver Function Tests: Recent Labs  Lab 11/28/23 0900 11/30/23 1044  ALBUMIN 3.7 3.6   No results for input(s): "LIPASE", "AMYLASE" in the last 168 hours. CBC: Recent Labs  Lab 11/28/23 0900 11/30/23 1044 12/02/23 1521  WBC 9.8 11.2* 9.6  HGB 11.4* 11.6* 12.4  HCT 34.0* 34.0* 36.8  MCV 90.7 89.2 90.6  PLT 259 247 244   Blood Culture    Component Value Date/Time   SDES BLOOD BLOOD LEFT ARM 05/20/2020 1238   SDES BLOOD RIGHT ANTECUBITAL 05/20/2020 1238   SPECREQUEST  05/20/2020 1238    BOTTLES DRAWN AEROBIC AND ANAEROBIC Blood Culture adequate volume   SPECREQUEST  05/20/2020 1238    BOTTLES DRAWN AEROBIC AND ANAEROBIC Blood Culture results may not be optimal due to an excessive volume of blood received in culture bottles   CULT  05/20/2020 1238    NO GROWTH 5 DAYS Performed at [facility], [address] [place], [place] [address]    CULT  05/20/2020 1238    NO GROWTH 5  DAYS Performed at [facility], [address] [place], [place] [address]    REPTSTATUS 05/25/2020 FINAL 05/20/2020 1238   REPTSTATUS 05/25/2020 FINAL 05/20/2020 1238    Cardiac Enzymes: No results for input(s): "CKTOTAL", "CKMB", "CKMBINDEX", "TROPONINI" in the last 168 hours. CBG: Recent Labs  Lab 12/03/23 1144 12/03/23 1635 12/03/23 2056 12/04/23 0604 12/04/23 1135  GLUCAP 272* 200* 143* 173* 278*   Iron Studies: No results for input(s): "IRON", "TIBC", "TRANSFERRIN", "FERRITIN" in the last 72 hours. @lablastinr3 @ Studies/Results: No results found. Medications:   (feeding supplement) PROSource Plus  30 mL Oral BID BM   allopurinol  300 mg Oral Daily   amLODipine  5 mg Oral QHS   aspirin  81 mg Oral Daily   atorvastatin  80 mg Oral Daily   clopidogrel  75 mg Oral Daily   furosemide  40 mg Oral Q T,Th,S,Su   heparin  5,000 Units Subcutaneous Q8H   hydrALAZINE  50 mg Oral BID   insulin aspart  0-9 Units Subcutaneous TID WC   insulin aspart  20 Units Subcutaneous TID WC   insulin glargine-yfgn  63 Units Subcutaneous QHS   losartan  100 mg Oral Daily   metoprolol tartrate  50 mg Oral BID   multivitamin  1 tablet Oral QHS   sevelamer carbonate  800 mg Oral TID WC   sorbitol  30 mL Oral Once     OP HD: MWF [facility]  115kg dry wt  Call for records 12/05/2023 please?     Assessment/ Plan: Debility - post acute L Pons CVA w/ double vision   ESKD - on HD MWF. Next HD 12/05/2023  HTN - taking 4 htn meds at home and here. BP well controlled. Avoid hypotension on HD.  Volume - no vol excess, prob has lost some body wt from this. 2.5kg under today. UF as tolerated to optimize volume.  Anemia of eskd - Hb 11-12, no esa needs.  MBD ckd - CCa and phos are in range. Cont binders w/ meals.  DM - insulin per pmd CAD h/o CABG (2013)  Kimley Apsey H. Tamyra Fojtik NP-C 12/04/2023, 11:44 AM  [employer] [phone]

## 2023-12-04 NOTE — Discharge Instructions (Addendum)
Inpatient Rehab Discharge Instructions  Sharon Arias Discharge date and time: No discharge date for patient encounter.   Activities/Precautions/ Functional Status: Activity: As tolerated Diet: Carb modified renal diet Wound Care: Routine skin checks Functional status:  ___ No restrictions     ___ Walk up steps independently ___ 24/7 supervision/assistance   ___ Walk up steps with assistance ___ Intermittent supervision/assistance  ___ Bathe/dress independently ___ Walk with walker     _x__ Bathe/dress with assistance ___ Walk Independently    ___ Shower independently ___ Walk with assistance    ___ Shower with assistance ___ No alcohol     ___ Return to work/school ________  Special Instructions:  No driving smoking or alcohol  Continue hemodialysis as directed   COMMUNITY REFERRALS UPON DISCHARGE:    Home Health:   PT     OT                    Agency:[facility]         Phone:[phone]  *HH therapies will resume with your current agency. Please expect follow-up within 2-3 days to schedule your home visit. If you have not received follow-up, be sure to contact the branch directly.*  Medical Equipment/Items Ordered:tub transfer bench (shipped to home)                                                 Agency/Supplier:[facility] [phone]     My questions have been answered and I understand these instructions. I will adhere to these goals and the provided educational materials after my discharge from the hospital.  Patient/Caregiver Signature _______________________________ Date __________  Clinician Signature _______________________________________ Date __________  Please bring this form and your medication list with you to all your follow-up doctor's appointments. STROKE/TIA DISCHARGE INSTRUCTIONS SMOKING Cigarette smoking nearly doubles your risk of having a stroke & is the single most alterable risk factor  If you smoke or have smoked in the last 12  months, you are advised to quit smoking for your health. Most of the excess cardiovascular risk related to smoking disappears within a year of stopping. Ask you doctor about anti-smoking medications [place] Quit Line: 1-800-QUIT NOW Free Smoking Cessation Classes [phone]  CHOLESTEROL Know your levels; limit fat & cholesterol in your diet  Lipid Panel     Component Value Date/Time   CHOL 141 11/27/2023 0454   TRIG 167 (H) 11/27/2023 0454   HDL 42 11/27/2023 0454   CHOLHDL 3.4 11/27/2023 0454   VLDL 33 11/27/2023 0454   LDLCALC 66 11/27/2023 0454     Many patients benefit from treatment even if their cholesterol is at goal. Goal: Total Cholesterol (CHOL) less than 160 Goal:  Triglycerides (TRIG) less than 150 Goal:  HDL greater than 40 Goal:  LDL (LDLCALC) less than 100   BLOOD PRESSURE American Stroke Association blood pressure target is less that 120/80 mm/Hg  Your discharge blood pressure is:  BP: 136/63 Monitor your blood pressure Limit your salt and alcohol intake Many individuals will require more than one medication for high blood pressure  DIABETES (A1c is a blood sugar average for last 3 months) Goal HGBA1c is under 7% (HBGA1c is blood sugar average for last 3 months)  Diabetes:    Lab Results  Component Value Date   HGBA1C 8.1 (H) 11/26/2023  Your HGBA1c can be lowered with medications, healthy diet, and exercise. Check your blood sugar as directed by your physician Call your physician if you experience unexplained or low blood sugars.  PHYSICAL ACTIVITY/REHABILITATION Goal is 30 minutes at least 4 days per week  Activity: Increase activity slowly, Therapies: Physical Therapy: Home Health Return to work:  Activity decreases your risk of heart attack and stroke and makes your heart stronger.  It helps control your weight and blood pressure; helps you relax and can improve your mood. Participate in a regular exercise program. Talk with your doctor about the best  form of exercise for you (dancing, walking, swimming, cycling).  DIET/WEIGHT Goal is to maintain a healthy weight  Your discharge diet is:  Diet Order             Diet Carb Modified Fluid consistency: Thin  Diet effective now                   liquids Your height is:  Height: 5\' 7"  (170.2 cm) Your current weight is: Weight: 113.6 kg Your Body Mass Index (BMI) is:  BMI (Calculated): 39.22 Following the type of diet specifically designed for you will help prevent another stroke. Your goal weight range is:   Your goal Body Mass Index (BMI) is 19-24. Healthy food habits can help reduce 3 risk factors for stroke:  High cholesterol, hypertension, and excess weight.  RESOURCES Stroke/Support Group:  Call [phone]   STROKE EDUCATION PROVIDED/REVIEWED AND GIVEN TO PATIENT Stroke warning signs and symptoms How to activate emergency medical system (call 911). Medications prescribed at discharge. Need for follow-up after discharge. Personal risk factors for stroke. Pneumonia vaccine given: No Flu vaccine given: No My questions have been answered, the writing is legible, and I understand these instructions.  I will adhere to these goals & educational materials that have been provided to me after my discharge from the hospital.

## 2023-12-04 NOTE — Progress Notes (Signed)
Placed patient on CPAP for the night.  

## 2023-12-04 NOTE — Progress Notes (Signed)
PROGRESS NOTE   Subjective/Complaints:  Pt reports doesn't eat breakfast- never does, but ate a little this AM.  Been drinking more.  Peeing better as well.  Refusing Lasix - said spoke with renal and they agree she can.  LBM 3 days ago.   ROS:   Pt denies SOB, abd pain, CP, N/V/ (+)C/D, and vision changes   Negative except for HPI-   Objective:   No results found. Recent Labs    12/02/23 1521  WBC 9.6  HGB 12.4  HCT 36.8  PLT 244   Recent Labs    12/02/23 1521  CREATININE 2.92*    Intake/Output Summary (Last 24 hours) at 12/04/2023 1605 Last data filed at 12/04/2023 1255 Gross per 24 hour  Intake 712 ml  Output --  Net 712 ml        Physical Exam: Vital Signs Blood pressure (!) 144/56, pulse (!) 59, temperature 97.7 F (36.5 C), temperature source Oral, resp. rate 16, height 5\' 7"  (1.702 m), weight 112.4 kg, SpO2 98%.      General: awake, alert, appropriate, legally blind; sitting up in bed; NAD HENT: conjugate gaze; oropharynx moist CV: regular rate and rhythm; no JVD Pulmonary: CTA B/L; no W/R/R- good air movement GI: soft, NT, ND, (+)BS- protuberant Psychiatric: appropriate Neurological: Ox3  Cognition: AAO to person, place, time and event.  Language: Mild dysarthria. Names 3/3 objects correctly.  Memory: Recalls 3/3 objects at 5 minutes. No apparent deficits  Insight: Good  insight into current condition.  Mood: Pleasant affect, appropriate mood.  Sensation: To light touch intact in BL UEs and LEs  Reflexes: 2+ in BL UE and LEs. Negative Hoffman's and babinski signs bilaterally.  CN:+ L facial droop Coordination:+ Ataxia LUE, LLE Spasticity: MAS 0 in all extremities.  Strength:                RUE: 5/5 SA, 5/5 EF, 5/5 EE, 5/5 WE, 5/5 FF, 5/5 FA                 LUE: 4/5 SA, 5-/5 EF, 5-/5 EE, 5-/5 WE, 5-/5 FF, 5-/5 FA                 RLE: 5/5 HF, 5/5 KE, 5/5 DF, 5/5 EHL, 5/5 PF                  LLE:  5-/5 HF, 5-/5 KE, 5/5 DF, 5/5 EHL, 5/5 PF       Assessment/Plan: 1. Functional deficits which require 3+ hours per day of interdisciplinary therapy in a comprehensive inpatient rehab setting. Physiatrist is providing close team supervision and 24 hour management of active medical problems listed below. Physiatrist and rehab team continue to assess barriers to discharge/monitor patient progress toward functional and medical goals  Care Tool:  Bathing    Body parts bathed by patient: Right arm, Left upper leg, Right lower leg, Left arm, Chest, Left lower leg, Abdomen, Face, Front perineal area, Buttocks, Right upper leg         Bathing assist Assist Level: Contact Guard/Touching assist     Upper Body Dressing/Undressing Upper body dressing   What is the  patient wearing?: Pull over shirt    Upper body assist Assist Level: Set up assist    Lower Body Dressing/Undressing Lower body dressing      What is the patient wearing?: Underwear/pull up, Pants     Lower body assist Assist for lower body dressing: Moderate Assistance - Patient 50 - 74%     Toileting Toileting    Toileting assist Assist for toileting: Contact Guard/Touching assist     Transfers Chair/bed transfer  Transfers assist     Chair/bed transfer assist level: Minimal Assistance - Patient > 75%     Locomotion Ambulation   Ambulation assist      Assist level: Minimal Assistance - Patient > 75% Assistive device: Hand held assist Max distance: 119ft   Walk 10 feet activity   Assist     Assist level: Minimal Assistance - Patient > 75% Assistive device: Hand held assist   Walk 50 feet activity   Assist    Assist level: Minimal Assistance - Patient > 75% Assistive device: Hand held assist    Walk 150 feet activity   Assist Walk 150 feet activity did not occur: Safety/medical concerns         Walk 10 feet on uneven surface  activity   Assist     Assist  level: Minimal Assistance - Patient > 75% Assistive device: Hand held assist, Other (comment) (railing)   Wheelchair     Assist Is the patient using a wheelchair?: Yes (to/from gym for energy conservation - uses an electric scooter for longer distances baseline) Type of Wheelchair: Manual    Wheelchair assist level: Dependent - Patient 0%      Wheelchair 50 feet with 2 turns activity    Assist        Assist Level: Dependent - Patient 0%   Wheelchair 150 feet activity     Assist      Assist Level: Dependent - Patient 0%   Blood pressure (!) 144/56, pulse (!) 59, temperature 97.7 F (36.5 C), temperature source Oral, resp. rate 16, height 5\' 7"  (1.702 m), weight 112.4 kg, SpO2 98%.  Medical Problem List and Plan: 1. Functional deficits secondary to ischemic infarct left brachium pontis secondary to small vessel disease             -patient may  shower             -ELOS/Goals: 10-14 days, supervision PT, supervision OT, modified independent SLP               - Stable for admission to IRF   Con't CIR PT and OT  2.  Antithrombotics: -DVT/anticoagulation:  Pharmaceutical: Heparin             -antiplatelet therapy: Aspirin 81 mg daily and Plavix 75 mg daily as prior to admission 3. Pain Management: Tylenol as needed  12/14- denies pain- con't tlyenol prn 4. Mood/Behavior/Sleep: Provide emotional support             -antipsychotic agents: N/A 5. Neuropsych/cognition: This patient is capable of making decisions on her own behalf. 6. Skin/Wound Care: Routine skin checks 7. Fluids/Electrolytes/Nutrition: Routine in and outs with follow-up chemistries 8.  End-stage renal disease.  Continue hemodialysis as directed- has oliguria  12/15- tolerating HD, but upset cannot go to bathroom at HD! 9.  CAD with CABG 2013.  No chest pain or shortness of breath.  Continue aspirin and Plavix 10.  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  Hemoglobin  A1c 8.1. Novolog  20 units 3 times daily with meals. Semglee 60 units nightly.  Check blood sugars before meals and at bedtime  12/14- 172-to 272- will increase Semglee to 63 units- suggest breaking dose up to BID  12/15- CBGs 143-278- suggest changing to BID dosing for Semglee, but will leave for primary physician to decide 11.  Hypertension.  Cozaar 100 mg daily, hydralazine 50 mg twice daily, Norvasc 5 mg daily, Lopressor 50 mg twice daily.  Monitor with increased mobility  12/14- BP is well controlled- con't regimen 12.  Hyperlipidemia.  Lipitor 13.  Diastolic congestive heart failure.  Lasix 40 mg Tuesday Thursday Saturday Sunday.  Monitor for any signs of fluid overload  12/14- pt refusing Lasix since doesn't want to pee a lot, but is also not drinking hardly at all- is purposefully dehydrating self so doesn't have to pee. Have encouraged pt to drink. 12/15- no increase in LE edema- said she spoke with renal about refusing Lasix- don't see a daily weight order- ordered  14.  History of gout.  Zyloprim 300 mg daily.  Monitor for any gout flares 15.Obesity.  BMI 39.50.  Dietary follow-up  12/14- BMI 38.81 - cont' to monitor and might be worth a dietitian consult 16.  Anemia of chronic disease.  Follow-up CBC.  12/14- Hb 12.4- doing well- con't to monitor  17. Constipation  12/15- LBM 3 days ago- ordered Sorbitol 30cc and soap suds enema (since on HD) if needed later on.    I spent a total of 38   minutes on total care today- >50% coordination of care- due to  D/w nursing about changes to meds- bowels, refusing Lasix and drinking up to her fluid restriction more    LOS: 2 days A FACE TO FACE EVALUATION WAS PERFORMED  Sharon Arias 12/04/2023, 4:05 PM

## 2023-12-04 NOTE — Discharge Summary (Signed)
Physician Discharge Summary  Patient ID: SYDELL ONTIVEROS MRN: [id number] DOB/AGE: 1961/10/04 62 y.o.  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 EXAM: MRI HEAD WITHOUT CONTRAST TECHNIQUE: Multiplanar, multiecho pulse sequences of the brain and surrounding structures were obtained without intravenous contrast. COMPARISON:  Brain MRI 11/26/2023. FINDINGS: Brain: Generalized parenchymal atrophy. 4 mm subacute infarct within the left periatrial white matter (series 2, image 25). In retrospect, this was present as an acute infarct on the prior brain MRI of 11/26/2023. Known small subacute infarct within the dorsal pons and middle cerebellar peduncle on the left. Tiny focus of T2 shine through again noted within the anterior pons just to the right of midline on the diffusion-weighted imaging. Known chronic cortical/subcortical right MCA territory infarct within the right parietal lobe. Associated chronic hemosiderin deposition at this site. Multiple chronic lacunar infarcts again noted within the pons. Background mild multifocal T2 FLAIR hyperintense signal abnormality within the cerebral white matter and pons, nonspecific but compatible with chronic small vessel ischemic disease. Small chronic infarcts again noted within the bilateral cerebellar hemispheres. 11 mm ovoid FLAIR hyperintense focus in the right paraclinoid region, likely reflecting a meningioma (series  6, image 16). In retrospect, this finding was present on the prior MRI. No extra-axial fluid collection. No midline shift. Vascular: Maintained flow voids within the proximal large arterial vessels. Skull and upper cervical spine: No focal worrisome marrow lesion. Sinuses/Orbits: No mass or acute finding within the imaged orbits. Prior but ocular lens replacement. No significant paranasal sinus disease. IMPRESSION: 1. Subacute infarcts within the dorsal left pons/left middle cerebellar peduncle and left periatrial white matter as described. 2. Background parenchymal atrophy, chronic small vessel ischemic disease and chronic infarcts as described. 3. Probable 11 mm paraclinoid meningioma on the right. Post-contrast MR imaging of the brain is recommended for further evaluation. Electronically Signed   By: Jackey Loge D.O.   On: 12/11/2023 14:32   ECHOCARDIOGRAM COMPLETE BUBBLE STUDY Result Date: 11/27/2023    ECHOCARDIOGRAM REPORT   Patient Name:   SOLEA OLP Date of Exam: 11/27/2023 Medical Rec #:  [id number]      Height:       67.0 in Accession #:    [id number]     Weight:       250.0 lb Date of Birth:  04/06/1961       BSA:          2.223 m Patient Age:    62 years       BP:           175/59 mmHg Patient Gender: F              HR:           67 bpm. Exam Location:  [facility] Procedure: 2D Echo, Cardiac Doppler, Color Doppler and Saline Contrast Bubble            Study Indications:     Stroke 434.91 / I63.9  History:         Patient has prior history of Echocardiogram examinations. CHF;  Risk Factors:Diabetes.  Sonographer:     Neysa Bonito Roar Referring Phys:  [id number] Eliezer Mccoy PATEL Diagnosing Phys: Jodelle Red MD IMPRESSIONS  1. Left ventricular ejection fraction, by estimation, is 55 to 60%. The left ventricle has normal function. The left ventricle has no regional wall motion abnormalities. There is mild left ventricular hypertrophy. Left ventricular diastolic parameters are indeterminate.  2.  Right ventricular systolic function is normal. The right ventricular size is normal. Tricuspid regurgitation signal is inadequate for assessing PA pressure.  3. The mitral valve is degenerative. Trivial mitral valve regurgitation. Mild mitral stenosis.  4. The aortic valve was not well visualized. Aortic valve regurgitation is not visualized. Aortic valve sclerosis is present, with no evidence of aortic valve stenosis.  5. Agitated saline contrast bubble study was negative, with no evidence of any interatrial shunt. Comparison(s): No significant change from prior study. Conclusion(s)/Recommendation(s): No intracardiac source of embolism detected on this transthoracic study. Consider a transesophageal echocardiogram to exclude cardiac source of embolism if clinically indicated. FINDINGS  Left Ventricle: Left ventricular ejection fraction, by estimation, is 55 to 60%. The left ventricle has normal function. The left ventricle has no regional wall motion abnormalities. The left ventricular internal cavity size was normal in size. There is  mild left ventricular hypertrophy. Left ventricular diastolic parameters are indeterminate. Right Ventricle: The right ventricular size is normal. Right vetricular wall thickness was not well visualized. Right ventricular systolic function is normal. Tricuspid regurgitation signal is inadequate for assessing PA pressure. Left Atrium: Left atrial size was normal in size. Right Atrium: Right atrial size was normal in size. Pericardium: There is no evidence of pericardial effusion. Mitral Valve: The mitral valve is degenerative in appearance. There is mild thickening of the mitral valve leaflet(s). There is mild calcification of the mitral valve leaflet(s). Trivial mitral valve regurgitation. Mild mitral valve stenosis. MV peak gradient, 12.7 mmHg. The mean mitral valve gradient is 4.0 mmHg. Tricuspid Valve: The tricuspid valve is not well visualized. Tricuspid valve regurgitation is  not demonstrated. No evidence of tricuspid stenosis. Aortic Valve: The aortic valve was not well visualized. Aortic valve regurgitation is not visualized. Aortic valve sclerosis is present, with no evidence of aortic valve stenosis. Aortic valve mean gradient measures 3.0 mmHg. Aortic valve peak gradient measures 6.7 mmHg. Aortic valve area, by VTI measures 1.80 cm. Pulmonic Valve: The pulmonic valve was not well visualized. Pulmonic valve regurgitation is not visualized. No evidence of pulmonic stenosis. Aorta: The aortic root and ascending aorta are structurally normal, with no evidence of dilitation. IAS/Shunts: The interatrial septum appears to be lipomatous. The interatrial septum was not well visualized. Agitated saline contrast was given intravenously to evaluate for intracardiac shunting. Agitated saline contrast bubble study was negative, with no evidence of any interatrial shunt.  LEFT VENTRICLE PLAX 2D LVIDd:         5.30 cm   Diastology LVIDs:         4.00 cm   LV e' medial:    4.68 cm/s LV PW:         1.20 cm   LV E/e' medial:  31.2 LV IVS:        1.30 cm   LV e' lateral:   6.96 cm/s LVOT diam:     1.70 cm   LV E/e' lateral: 21.0 LV SV:         54 LV SV Index:   24 LVOT Area:     2.27 cm  RIGHT VENTRICLE RV Basal diam:  3.60 cm RV Mid diam:    3.00 cm RV S prime:     8.05 cm/s TAPSE (M-mode): 2.0 cm LEFT ATRIUM             Index        RIGHT ATRIUM           Index LA diam:        4.00 cm 1.80 cm/m   RA Area:     15.60 cm LA Vol (A2C):   51.0 ml 22.94 ml/m  RA Volume:   35.10 ml  15.79 ml/m LA Vol (A4C):   56.3 ml 25.32 ml/m LA Biplane Vol: 53.4 ml 24.02 ml/m  AORTIC VALVE                    PULMONIC VALVE AV Area (Vmax):    1.60 cm     PV Vmax:        1.15 m/s AV Area (Vmean):   1.71 cm     PV Peak grad:   5.3 mmHg AV Area (VTI):     1.80 cm     RVOT Peak grad: 2 mmHg AV Vmax:           129.00 cm/s AV Vmean:          84.900 cm/s AV VTI:            0.300 m AV Peak Grad:      6.7 mmHg AV Mean  Grad:      3.0 mmHg LVOT Vmax:         91.00 cm/s LVOT Vmean:        63.900 cm/s LVOT VTI:          0.238 m LVOT/AV VTI ratio: 0.79  AORTA Ao Asc diam: 2.90 cm MITRAL VALVE MV Area (PHT): 5.16 cm     SHUNTS MV Area VTI:   1.22 cm     Systemic VTI:  0.24 m MV Peak grad:  12.7 mmHg    Systemic Diam: 1.70 cm MV Mean grad:  4.0 mmHg MV Vmax:       1.78 m/s MV Vmean:      85.6 cm/s MV Decel Time: 147 msec MV E velocity: 146.00 cm/s MV A velocity: 91.70 cm/s MV E/A ratio:  1.59 MV A Prime:    8.9 cm/s Jodelle Red MD Electronically signed by Jodelle Red MD Signature Date/Time: 11/27/2023/4:54:31 PM    Final    DG Chest 1 View Result Date: 11/27/2023 CLINICAL DATA:  62 year old female with left brainstem infarct. EXAM: CHEST  1 VIEW COMPARISON:  Chest radiographs 02/05/2021. FINDINGS: Portable AP semi upright view at 0421 hours. More lordotic positioning. Chronic sternotomy, CABG. Stable mild cardiomegaly mediastinal contours. Visualized tracheal air column is within normal limits. Allowing for portable technique the lungs are clear. No pneumothorax or pleural effusion. Paucity of bowel gas. No acute osseous abnormality identified. IMPRESSION: Chronic cardiac surgery.  No acute cardiopulmonary abnormality. Electronically Signed   By: Odessa Fleming M.D.   On: 11/27/2023 05:31   MR BRAIN WO CONTRAST Result Date: 11/26/2023 CLINICAL DATA:  Neuro deficit, acute, stroke suspected; Vertebral artery dissection suspected; Vertigo, central EXAM: MRI HEAD WITHOUT CONTRAST MRA HEAD WITHOUT CONTRAST MRA NECK WITHOUT CONTRAST TECHNIQUE: Multiplanar, multi-echo pulse sequences of the brain and surrounding structures were acquired without intravenous contrast. Angiographic images of the Circle of Willis were acquired using MRA technique without intravenous contrast. Angiographic images of the neck were acquired using MRA technique without  intravenous contrast. Carotid stenosis measurements (when applicable) are  obtained utilizing NASCET criteria, using the distal internal carotid diameter as the denominator. COMPARISON:  None Available. FINDINGS: MRI HEAD FINDINGS Brain: There acute infarcts in the brachium pontis on the left (series 8, image 10). No hemorrhage. No hydrocephalus. No extra-axial fluid collection. No mass effect. No mass lesion. Background of chronic microvascular ischemic change with a chronic infarct in the right parietal lobe. Vascular: Normal flow voids. Skull and upper cervical spine: Normal marrow signal. Sinuses/Orbits: No middle ear or mastoid effusion. Paranasal sinuses are clear. Bilateral lens replacement. Orbits are otherwise unremarkable. Other: None. MRA HEAD FINDINGS Anterior circulation: Severe narrowing in the ophthalmic segment of the right ICA (series 1, image 71). No aneurysm. No vascular malformation. No occlusion. Posterior circulation: No aneurysm. No occlusion. No significant stenosis. Anatomic variants: None MRA NECK FINDINGS Aortic arch: Not visualized, likely to respiratory motion artifact. Right carotid system: The right common carotid artery in the proximal aspect of the right internal carotid artery are poorly visualized and incompletely assessed due to the degree of respiratory motion artifact. The mid to distal segments of the right ICA are normal in appearance. Left carotid system: The left common carotid artery in the proximal aspect of the left internal carotid artery are poorly visualized and incompletely assessed due to the degree of respiratory motion artifact. The mid to distal segments of the left ICA are normal in appearance. Vertebral arteries: The V1 segments of bilateral vertebral arteries are not visualized due to the degree of respiratory motion artifact. The distal V2 segments in the V4 segments are normal in appearance. Other: None. IMPRESSION: 1. Acute infarct in the brachium pontis on the left. 2. Severe narrowing in the ophthalmic segment of the right ICA.  3. The aortic arch, common carotid arteries, proximal internal carotid arteries, and V1 segments of bilateral vertebral arteries are poorly visualized and incompletely assessed due to the degree of respiratory motion artifact. The mid to distal segments of the carotid and vertebral arteries are normal in appearance. Electronically Signed   By: Lorenza Cambridge M.D.   On: 11/26/2023 19:28   MR ANGIO HEAD WO CONTRAST Result Date: 11/26/2023 CLINICAL DATA:  Neuro deficit, acute, stroke suspected; Vertebral artery dissection suspected; Vertigo, central EXAM: MRI HEAD WITHOUT CONTRAST MRA HEAD WITHOUT CONTRAST MRA NECK WITHOUT CONTRAST TECHNIQUE: Multiplanar, multi-echo pulse sequences of the brain and surrounding structures were acquired without intravenous contrast. Angiographic images of the Circle of Willis were acquired using MRA technique without intravenous contrast. Angiographic images of the neck were acquired using MRA technique without intravenous contrast. Carotid stenosis measurements (when applicable) are obtained utilizing NASCET criteria, using the distal internal carotid diameter as the denominator. COMPARISON:  None Available. FINDINGS: MRI HEAD FINDINGS Brain: There acute infarcts in the brachium pontis on the left (series 8, image 10). No hemorrhage. No hydrocephalus. No extra-axial fluid collection. No mass effect. No mass lesion. Background of chronic microvascular ischemic change with a chronic infarct in the right parietal lobe. Vascular: Normal flow voids. Skull and upper cervical spine: Normal marrow signal. Sinuses/Orbits: No middle ear or mastoid effusion. Paranasal sinuses are clear. Bilateral lens replacement. Orbits are otherwise unremarkable. Other: None. MRA HEAD FINDINGS Anterior circulation: Severe narrowing in the ophthalmic segment of the right ICA (series 1, image 71). No aneurysm. No vascular malformation. No occlusion. Posterior circulation: No aneurysm. No occlusion. No  significant stenosis. Anatomic variants: None MRA NECK FINDINGS Aortic arch: Not visualized, likely to respiratory motion artifact. Right  carotid system: The right common carotid artery in the proximal aspect of the right internal carotid artery are poorly visualized and incompletely assessed due to the degree of respiratory motion artifact. The mid to distal segments of the right ICA are normal in appearance. Left carotid system: The left common carotid artery in the proximal aspect of the left internal carotid artery are poorly visualized and incompletely assessed due to the degree of respiratory motion artifact. The mid to distal segments of the left ICA are normal in appearance. Vertebral arteries: The V1 segments of bilateral vertebral arteries are not visualized due to the degree of respiratory motion artifact. The distal V2 segments in the V4 segments are normal in appearance. Other: None. IMPRESSION: 1. Acute infarct in the brachium pontis on the left. 2. Severe narrowing in the ophthalmic segment of the right ICA. 3. The aortic arch, common carotid arteries, proximal internal carotid arteries, and V1 segments of bilateral vertebral arteries are poorly visualized and incompletely assessed due to the degree of respiratory motion artifact. The mid to distal segments of the carotid and vertebral arteries are normal in appearance. Electronically Signed   By: Lorenza Cambridge M.D.   On: 11/26/2023 19:28   MR ANGIO NECK WO CONTRAST Result Date: 11/26/2023 CLINICAL DATA:  Neuro deficit, acute, stroke suspected; Vertebral artery dissection suspected; Vertigo, central EXAM: MRI HEAD WITHOUT CONTRAST MRA HEAD WITHOUT CONTRAST MRA NECK WITHOUT CONTRAST TECHNIQUE: Multiplanar, multi-echo pulse sequences of the brain and surrounding structures were acquired without intravenous contrast. Angiographic images of the Circle of Willis were acquired using MRA technique without intravenous contrast. Angiographic images of the  neck were acquired using MRA technique without intravenous contrast. Carotid stenosis measurements (when applicable) are obtained utilizing NASCET criteria, using the distal internal carotid diameter as the denominator. COMPARISON:  None Available. FINDINGS: MRI HEAD FINDINGS Brain: There acute infarcts in the brachium pontis on the left (series 8, image 10). No hemorrhage. No hydrocephalus. No extra-axial fluid collection. No mass effect. No mass lesion. Background of chronic microvascular ischemic change with a chronic infarct in the right parietal lobe. Vascular: Normal flow voids. Skull and upper cervical spine: Normal marrow signal. Sinuses/Orbits: No middle ear or mastoid effusion. Paranasal sinuses are clear. Bilateral lens replacement. Orbits are otherwise unremarkable. Other: None. MRA HEAD FINDINGS Anterior circulation: Severe narrowing in the ophthalmic segment of the right ICA (series 1, image 71). No aneurysm. No vascular malformation. No occlusion. Posterior circulation: No aneurysm. No occlusion. No significant stenosis. Anatomic variants: None MRA NECK FINDINGS Aortic arch: Not visualized, likely to respiratory motion artifact. Right carotid system: The right common carotid artery in the proximal aspect of the right internal carotid artery are poorly visualized and incompletely assessed due to the degree of respiratory motion artifact. The mid to distal segments of the right ICA are normal in appearance. Left carotid system: The left common carotid artery in the proximal aspect of the left internal carotid artery are poorly visualized and incompletely assessed due to the degree of respiratory motion artifact. The mid to distal segments of the left ICA are normal in appearance. Vertebral arteries: The V1 segments of bilateral vertebral arteries are not visualized due to the degree of respiratory motion artifact. The distal V2 segments in the V4 segments are normal in appearance. Other: None.  IMPRESSION: 1. Acute infarct in the brachium pontis on the left. 2. Severe narrowing in the ophthalmic segment of the right ICA. 3. The aortic arch, common carotid arteries, proximal internal carotid arteries, and  V1 segments of bilateral vertebral arteries are poorly visualized and incompletely assessed due to the degree of respiratory motion artifact. The mid to distal segments of the carotid and vertebral arteries are normal in appearance. Electronically Signed   By: Lorenza Cambridge M.D.   On: 11/26/2023 19:28    Labs:  Basic Metabolic Panel: Recent Labs  Lab 12/09/23 2008 12/11/23 0744 12/13/23 1341  NA 142 141 136  K 4.3 4.1 4.7  CL 103 100 97*  CO2 22 28 22   GLUCOSE 179* 164* 272*  BUN 59* 43* 59*  CREATININE 4.81* 4.61* 5.02*  CALCIUM 9.7 9.6 10.0  PHOS 5.5* 5.8* 4.0    CBC: Recent Labs  Lab 12/09/23 2008 12/13/23 1341  WBC 11.5* 16.6*  NEUTROABS 7.3  --   HGB 10.5* 10.4*  HCT 31.2* 31.1*  MCV 92.0 92.0  PLT 234 258    CBG: Recent Labs  Lab 12/13/23 2033 12/14/23 0546 12/14/23 1114 12/14/23 1628 12/14/23 2048  GLUCAP 312* 355* 234* 302* 287*   Family history.  Mother with breast cancer father with prostate cancer.  Denies any colon cancer esophageal cancer or rectal cancer  Brief HPI:   MICHAE PLYMIRE is a 62 y.o. right-handed female with history of CVA without residual deficits, obesity with BMI 39.50, OSA with CPAP, hypertension, anemia of chronic disease, diastolic congestive heart failure, end-stage renal disease on hemodialysis, diabetes mellitus with neuropathy as well as retinopathy and legally blind, CAD with CABG maintained on aspirin as well as Plavix.  Per chart review lives alone independent with mobility without assistive device.  She does not drive.  Her groceries are delivered.  Presented [facility] 11/26/2023 with progressive dizziness that started during a session of her dialysis as well as blurred vision with headache.  MRI/MRA of the head showed acute  infarct in the brachium pontis on the left.  Severe narrowing in the ophthalmic segment of right ICA.  Admission chemistries unremarkable except WBC 13,100 BUN 36 creatinine 3.52 hemoglobin A1c 8.1 urine drug screen negative.  Echocardiogram with ejection fraction of 55 to 60% no wall motion abnormalities.  Neurology follow-up continued on low-dose aspirin and Plavix for CVA prophylaxis as prior to admission.  Subcutaneous heparin for DVT prophylaxis.  Tolerating a regular consistency diet.  Therapy evaluations completed due to patient decreased functional mobility was admitted for a comprehensive rehab program.   Hospital Course: LATANA ANTE was admitted to rehab 12/02/2023 for inpatient therapies to consist of PT, ST and OT at least three hours five days a week. Past admission physiatrist, therapy team and rehab RN have worked together to provide customized collaborative inpatient rehab.  Pertaining to patient's ischemic infarct left brachium pontis secondary to small vessel disease.  There was some question of facial weakness 12/11/2023 with MRI completed showing subacute infarct within the dorsal left pons/left middle cerebellar peduncle and left periatrial white matter as described.  Neurology follow-up did not note any acute findings.  Patient remained on aspirin and Plavix for CVA prophylaxis follow-up neurology services.  Subcutaneous heparin for DVT prophylaxis.  Patient did experience some left facial droop likely Bell's palsy MRI 12/22 without new lesions or changes neurology consulted agreed with tentative diagnosis of Bell's palsy started on acyclovir and prednisone.  End-stage renal disease with hemodialysis as directed.  CAD with CABG 2013 no chest pain or shortness of breath continued aspirin and Plavix.  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  Hemoglobin A1c 8.1 insulin  therapy as directed with diabetic teaching.  Blood pressure controlled on Cozaar  hydralazine as well as Norvasc and Lopressor would need close monitoring as outpatient.  Lipitor ongoing for hyperlipidemia.  Diastolic congestive heart failure with Lasix Tuesday Thursday Saturday Sunday monitoring for any signs of fluid overload.  History of gout with Zyloprim as indicated monitoring for any gout flareups.  Obesity with BMI 39.50 dietary follow-up.  Anemia of chronic disease no bleeding episodes   Blood pressures were monitored on TID basis and soft and monitored  Diabetes has been monitored with ac/hs CBG checks and SSI was use prn for tighter BS control.    Rehab course: During patient's stay in rehab weekly team conferences were held to monitor patient's progress, set goals and discuss barriers to discharge. At admission, patient required minimal assist 10 feet rolling walker minimal assist sit to stand  Physical exam.  Blood pressure 116/52 pulse 76 temperature 98.6 respirations 18 oxygen saturation is 97% room air Constitutional.  No acute distress HEENT Head.  Normocephalic and atraumatic Eyes.  Pupils round and reactive to light no discharge without nystagmus Neck.  Supple nontender no JVD without thyromegaly Cardiac regular rate and rhythm without any extra sounds or murmur heard Abdomen.  Soft nontender positive bowel sounds without rebound Respiratory effort normal no respiratory distress without wheeze Reflexes 2+ bilateral upper and lower extremities Musculoskeletal Right upper extremity 5/5 Left upper extremity 4/5 SA 5 -/5 EF 5 -/5 EE 5 -/5W EE 5 -/5 FF 5 -/5 FA Right lower extremity 5/5 Left lower extremity 5/5  He/She  has had improvement in activity tolerance, balance, postural control as well as ability to compensate for deficits. He/She has had improvement in functional use RUE/LUE  and RLE/LLE as well as improvement in awareness.  Patient ambulates under 95 feet contact-guard rolling walker contact-guard supervision.  Perform sit to stand stand pivot  transfers close supervision due to limited vision.  Completed functional mobility to the bathroom using rollator and minimal assist provided for balance verbal cues for safety.  Completed functional ability back to her room with light minimal assist for balance and navigation with rollator.  Completed grooming hygiene task in seated position for energy conservation with supervision.  SLP follow-up patient recalled speech intelligibility strategies energy conservation methods.  SLP engaged patient and guided conversational exchanges with 100% intelligible independent sessions.  Full family teaching completed plan discharge to home       Disposition:  Discharge disposition: 06-Home-Health Care Svc        Diet: Renal diet  Special Instructions: No driving smoking or alcohol  Medications on discharge 1.  Tylenol as needed 2.  Zyloprim 300 mg p.o. daily 3.  Norvasc 5 mg p.o. nightly 4.  Aspirin 81 mg p.o. daily 5.  Lipitor 80 mg p.o. daily 6.  Plavix 75 mg p.o. daily 7.  Lasix 40 mg daily as needed edema 8.  Hydralazine 50 mg p.o. twice daily 9.  NovoLog 20 units 3 times daily with meals 10.  Lantus insulin 66 units nightly 11.  Cozaar 100 mg p.o. daily 12.  Antivert 25 mg p.o. 3 times daily as needed dizziness 13.  Lopressor 50 mg p.o. twice daily 14.  Rena-Vite 1 tablet nightly 15.  Renvela 800 mg p.o. 3 times daily 16.  Zovirax 200 mg twice daily x 6 days and stop 17.  Prednisone 60 mg daily with breakfast x 3 days    30-35 minutes were spent completing discharge summary and discharge  planning  Discharge Instructions     Ambulatory referral to Neurology   Complete by: As directed    An appointment is requested in approximately: 4 weeks ischemic infarction   Ambulatory referral to Physical Medicine Rehab   Complete by: As directed    Moderate complexity follow-up 1 to 2 weeks ischemic infarction        Follow-up Information     Kirsteins, Victorino Sparrow, MD Follow up.    Specialty: Physical Medicine and Rehabilitation Why: Office to call for appointment Contact information: [address] [place] [address] [phone]         Delano Metz, MD Follow up.   Specialty: Nephrology Why: Call for appointment Contact information: [address] [place] [address] [phone]                 Signed: Mcarthur Rossetti Tavyn Kurka 12/15/2023, 5:03 AM

## 2023-12-05 DIAGNOSIS — I2510 Atherosclerotic heart disease of native coronary artery without angina pectoris: Secondary | ICD-10-CM | POA: Diagnosis not present

## 2023-12-05 DIAGNOSIS — Z794 Long term (current) use of insulin: Secondary | ICD-10-CM

## 2023-12-05 DIAGNOSIS — E1140 Type 2 diabetes mellitus with diabetic neuropathy, unspecified: Secondary | ICD-10-CM | POA: Diagnosis not present

## 2023-12-05 DIAGNOSIS — N186 End stage renal disease: Secondary | ICD-10-CM | POA: Diagnosis not present

## 2023-12-05 DIAGNOSIS — I639 Cerebral infarction, unspecified: Secondary | ICD-10-CM | POA: Diagnosis not present

## 2023-12-05 DIAGNOSIS — Z992 Dependence on renal dialysis: Secondary | ICD-10-CM

## 2023-12-05 LAB — RENAL FUNCTION PANEL
Albumin: 3.5 g/dL (ref 3.5–5.0)
Albumin: 3.6 g/dL (ref 3.5–5.0)
Anion gap: 11 (ref 5–15)
Anion gap: 13 (ref 5–15)
BUN: 73 mg/dL — ABNORMAL HIGH (ref 8–23)
BUN: 72 mg/dL — ABNORMAL HIGH (ref 8–23)
CO2: 26 mmol/L (ref 22–32)
CO2: 23 mmol/L (ref 22–32)
Calcium: 9.9 mg/dL (ref 8.9–10.3)
Calcium: 9.7 mg/dL (ref 8.9–10.3)
Chloride: 104 mmol/L (ref 98–111)
Chloride: 105 mmol/L (ref 98–111)
Creatinine, Ser: 5.26 mg/dL — ABNORMAL HIGH (ref 0.44–1.00)
Creatinine, Ser: 5.13 mg/dL — ABNORMAL HIGH (ref 0.44–1.00)
GFR, Estimated: 9 mL/min — ABNORMAL LOW (ref >60)
GFR, Estimated: 9 mL/min — ABNORMAL LOW (ref >60)
Glucose, Bld: 182 mg/dL — ABNORMAL HIGH (ref 70–99)
Glucose, Bld: 117 mg/dL — ABNORMAL HIGH (ref 70–99)
Phosphorus: 4.9 mg/dL — ABNORMAL HIGH (ref 2.5–4.6)
Phosphorus: 4.6 mg/dL (ref 2.5–4.6)
Potassium: 4.7 mmol/L (ref 3.5–5.1)
Potassium: 4 mmol/L (ref 3.5–5.1)
Sodium: 141 mmol/L (ref 135–145)
Sodium: 141 mmol/L (ref 135–145)

## 2023-12-05 LAB — CBC WITH DIFFERENTIAL/PLATELET
Abs Immature Granulocytes: 0.08 10*3/uL — ABNORMAL HIGH (ref 0.00–0.07)
Abs Immature Granulocytes: 0.08 10*3/uL — ABNORMAL HIGH (ref 0.00–0.07)
Basophils Absolute: 0.1 10*3/uL (ref 0.0–0.1)
Basophils Absolute: 0.1 10*3/uL (ref 0.0–0.1)
Basophils Relative: 1 %
Basophils Relative: 1 %
Eosinophils Absolute: 0.3 10*3/uL (ref 0.0–0.5)
Eosinophils Absolute: 0.3 10*3/uL (ref 0.0–0.5)
Eosinophils Relative: 3 %
Eosinophils Relative: 3 %
HCT: 34.1 % — ABNORMAL LOW (ref 36.0–46.0)
HCT: 33.9 % — ABNORMAL LOW (ref 36.0–46.0)
Hemoglobin: 11.3 g/dL — ABNORMAL LOW (ref 12.0–15.0)
Hemoglobin: 10.9 g/dL — ABNORMAL LOW (ref 12.0–15.0)
Immature Granulocytes: 1 %
Immature Granulocytes: 1 %
Lymphocytes Relative: 22 %
Lymphocytes Relative: 23 %
Lymphs Abs: 2.3 10*3/uL (ref 0.7–4.0)
Lymphs Abs: 2.5 10*3/uL (ref 0.7–4.0)
MCH: 30.8 pg (ref 26.0–34.0)
MCH: 29.8 pg (ref 26.0–34.0)
MCHC: 33.1 g/dL (ref 30.0–36.0)
MCHC: 32.2 g/dL (ref 30.0–36.0)
MCV: 92.9 fL (ref 80.0–100.0)
MCV: 92.6 fL (ref 80.0–100.0)
Monocytes Absolute: 0.8 10*3/uL (ref 0.1–1.0)
Monocytes Absolute: 0.9 10*3/uL (ref 0.1–1.0)
Monocytes Relative: 7 %
Monocytes Relative: 8 %
Neutro Abs: 7 10*3/uL (ref 1.7–7.7)
Neutro Abs: 7.1 10*3/uL (ref 1.7–7.7)
Neutrophils Relative %: 66 %
Neutrophils Relative %: 64 %
Platelets: 241 10*3/uL (ref 150–400)
Platelets: 245 10*3/uL (ref 150–400)
RBC: 3.67 MIL/uL — ABNORMAL LOW (ref 3.87–5.11)
RBC: 3.66 MIL/uL — ABNORMAL LOW (ref 3.87–5.11)
RDW: 14 % (ref 11.5–15.5)
RDW: 14 % (ref 11.5–15.5)
WBC: 10.7 10*3/uL — ABNORMAL HIGH (ref 4.0–10.5)
WBC: 11 10*3/uL — ABNORMAL HIGH (ref 4.0–10.5)
nRBC: 0 % (ref 0.0–0.2)
nRBC: 0 % (ref 0.0–0.2)

## 2023-12-05 LAB — GLUCOSE, CAPILLARY
Glucose-Capillary: 90 mg/dL (ref 70–99)
Glucose-Capillary: 85 mg/dL (ref 70–99)
Glucose-Capillary: 131 mg/dL — ABNORMAL HIGH (ref 70–99)
Glucose-Capillary: 143 mg/dL — ABNORMAL HIGH (ref 70–99)

## 2023-12-05 NOTE — Progress Notes (Signed)
Pt receives out-pt HD at [facility] on MWF. Will assist as needed.   Olivia Canter Renal Navigator [phone]

## 2023-12-05 NOTE — Progress Notes (Signed)
Inpatient Rehabilitation  Patient information reviewed and entered into eRehab system by Oyuki Hogan M. Eri Mcevers, M.A., CCC/SLP, PPS Coordinator.  Information including medical coding, functional ability and quality indicators will be reviewed and updated through discharge.    

## 2023-12-05 NOTE — Progress Notes (Signed)
PROGRESS NOTE   Subjective/Complaints:  C/o dizziness while in bed or sitting, eye closure may help minimally  No N/V ROS:   Pt denies SOB,CP   Negative except for HPI-   Objective:   No results found. Recent Labs    12/02/23 1521  WBC 9.6  HGB 12.4  HCT 36.8  PLT 244   Recent Labs    12/02/23 1521  CREATININE 2.92*    Intake/Output Summary (Last 24 hours) at 12/05/2023 1056 Last data filed at 12/05/2023 0728 Gross per 24 hour  Intake 712 ml  Output --  Net 712 ml        Physical Exam: Vital Signs Blood pressure 131/66, pulse 61, temperature 97.7 F (36.5 C), resp. rate 18, height 5\' 7"  (1.702 m), weight 113.6 kg, SpO2 100%.   General: No acute distress Mood and affect are appropriate Heart: Regular rate and rhythm no rubs murmurs or extra sounds Lungs: Clear to auscultation, breathing unlabored, no rales or wheezes Abdomen: Positive bowel sounds, soft nontender to palpation, nondistended    Neuro:  Eyes without evidence of nystagmus  Tone is normal without evidence of spasticity Cerebellar exam shows mild left ataxia on finger nose finger testing No evidence of trunkal ataxia  Motor strength is 5/5 in bilateral deltoid, biceps, triceps, finger flexors and extensors, wrist flexors and extensors, hip flexors, knee flexors and extensors, ankle dorsiflexors, plantar flexors, invertors and evertors, toe flexors and extensors  Sensory exam is normal to pinprick, proprioception and light touch in the upper and lower limbs   Cranial nerves II- Visual fields are intact to confrontation testing, no blurring of vision III- no evidence of ptosis, upward, downward and medial gaze intact IV- no vertical diplopia or head tilt V- no facial numbness or masseter weakness VI- no pupil abduction weakness VII- no facial droop, good lid closure VII- normal auditory acuity IX- no pharygeal weakness, X- no  pharyngeal weakness, no hoarseness XI- no trap or SCM weakness XII- no glossal weakness        Assessment/Plan: 1. Functional deficits which require 3+ hours per day of interdisciplinary therapy in a comprehensive inpatient rehab setting. Physiatrist is providing close team supervision and 24 hour management of active medical problems listed below. Physiatrist and rehab team continue to assess barriers to discharge/monitor patient progress toward functional and medical goals  Care Tool:  Bathing    Body parts bathed by patient: Right arm, Left upper leg, Right lower leg, Left arm, Chest, Left lower leg, Abdomen, Face, Front perineal area, Buttocks, Right upper leg         Bathing assist Assist Level: Contact Guard/Touching assist     Upper Body Dressing/Undressing Upper body dressing   What is the patient wearing?: Pull over shirt    Upper body assist Assist Level: Set up assist    Lower Body Dressing/Undressing Lower body dressing      What is the patient wearing?: Underwear/pull up, Pants     Lower body assist Assist for lower body dressing: Moderate Assistance - Patient 50 - 74%     Toileting Toileting    Toileting assist Assist for toileting: Contact Guard/Touching assist  Transfers Chair/bed transfer  Transfers assist     Chair/bed transfer assist level: Minimal Assistance - Patient > 75%     Locomotion Ambulation   Ambulation assist      Assist level: Minimal Assistance - Patient > 75% Assistive device: Hand held assist Max distance: 153ft   Walk 10 feet activity   Assist     Assist level: Minimal Assistance - Patient > 75% Assistive device: Hand held assist   Walk 50 feet activity   Assist    Assist level: Minimal Assistance - Patient > 75% Assistive device: Hand held assist    Walk 150 feet activity   Assist Walk 150 feet activity did not occur: Safety/medical concerns         Walk 10 feet on uneven surface   activity   Assist     Assist level: Minimal Assistance - Patient > 75% Assistive device: Hand held assist, Other (comment) (railing)   Wheelchair     Assist Is the patient using a wheelchair?: Yes (to/from gym for energy conservation - uses an electric scooter for longer distances baseline) Type of Wheelchair: Manual    Wheelchair assist level: Dependent - Patient 0%      Wheelchair 50 feet with 2 turns activity    Assist        Assist Level: Dependent - Patient 0%   Wheelchair 150 feet activity     Assist      Assist Level: Dependent - Patient 0%   Blood pressure 131/66, pulse 61, temperature 97.7 F (36.5 C), resp. rate 18, height 5\' 7"  (1.702 m), weight 113.6 kg, SpO2 100%.  Medical Problem List and Plan: 1. Functional deficits secondary to ischemic infarct left brachium pontis secondary to small vessel disease             -patient may  shower             -ELOS/Goals: 10-14 days, supervision PT, supervision OT, modified independent SLP               - Stable for admission to IRF   Con't CIR PT and OT  2.  Antithrombotics: -DVT/anticoagulation:  Pharmaceutical: Heparin             -antiplatelet therapy: Aspirin 81 mg daily and Plavix 75 mg daily as prior to admission 3. Pain Management: Tylenol as needed  12/14- denies pain- con't tlyenol prn 4. Mood/Behavior/Sleep: Provide emotional support             -antipsychotic agents: N/A 5. Neuropsych/cognition: This patient is capable of making decisions on her own behalf. 6. Skin/Wound Care: Routine skin checks 7. Fluids/Electrolytes/Nutrition: Routine in and outs with follow-up chemistries 8.  End-stage renal disease.  Continue hemodialysis as directed- has oliguria  12/15- tolerating HD, but upset cannot go to bathroom at HD! 9.  CAD with CABG 2013.  No chest pain or shortness of breath.  Continue aspirin and Plavix 10.  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  Hemoglobin A1c 8.1.  Novolog 20 units 3 times daily with meals. Semglee 60 units nightly.  Check blood sugars before meals and at bedtime  CBG (last 3)  Recent Labs    12/04/23 1634 12/04/23 2107 12/05/23 0607  GLUCAP 208* 164* 90  Fair control   11.  Hypertension.  Cozaar 100 mg daily, hydralazine 50 mg twice daily, Norvasc 5 mg daily, Lopressor 50 mg twice daily.  Monitor with increased mobility   Vitals:   12/05/23  0524 12/05/23 0749  BP: 131/66   Pulse: (!) 57 61  Resp: 18   Temp: 97.7 F (36.5 C)   SpO2: 100%     12.  Hyperlipidemia.  Lipitor 13.  Diastolic congestive heart failure.  Lasix 40 mg Tuesday Thursday Saturday Sunday.  Monitor for any signs of fluid overload  12/14- pt refusing Lasix since doesn't want to pee a lot, but is also not drinking hardly at all- is purposefully dehydrating self so doesn't have to pee. Have encouraged pt to drink. 12/15- no increase in LE edema- said she spoke with renal about refusing Lasix- don't see a daily weight order- ordered  14.  History of gout.  Zyloprim 300 mg daily.  Monitor for any gout flares 15.Obesity.  BMI 39.50.  Dietary follow-up  12/14- BMI 38.81 - cont' to monitor and might be worth a dietitian consult 16.  Anemia of chronic disease.  Follow-up CBC.  12/14- Hb 12.4- doing well- con't to monitor  17. Constipation  12/15- LBM 3 days ago- ordered Sorbitol 30cc and soap suds enema (since on HD) if needed later on.   18.  Diabetic retinopathy f/u optho      LOS: 3 days A FACE TO FACE EVALUATION WAS PERFORMED  Erick Colace 12/05/2023, 10:56 AM

## 2023-12-05 NOTE — Progress Notes (Addendum)
  [facility] Progress Note   Subjective:   Seen in room - therapy going well. For HD later today. No CP/dyspnea.  Objective Vitals:   12/05/23 0430 12/05/23 0524 12/05/23 0749 12/05/23 1232  BP:  131/66  (!) 136/57  Pulse:  (!) 57 61 (!) 55  Resp:  18  16  Temp:  97.7 F (36.5 C)  98 F (36.7 C)  TempSrc:    Oral  SpO2:  100%  96%  Weight: 113.6 kg     Height:       Physical Exam General: Well appearing, NAD. Room air. L facial droop Heart: RRR; no murmur Lungs: R lung clear, wheezing LUL Abdomen: soft Extremities: 1+ BLE edema Dialysis Access: LUE AVF + t/b  Additional Objective Labs: Basic Metabolic Panel: Recent Labs  Lab 11/30/23 1044 12/02/23 1521  NA 138  --   K 4.0  --   CL 104  --   CO2 21*  --   GLUCOSE 157*  --   BUN 62*  --   CREATININE 4.63* 2.92*  CALCIUM 9.3  --   PHOS 5.9*  --    Liver Function Tests: Recent Labs  Lab 11/30/23 1044  ALBUMIN 3.6   CBC: Recent Labs  Lab 11/30/23 1044 12/02/23 1521  WBC 11.2* 9.6  HGB 11.6* 12.4  HCT 34.0* 36.8  MCV 89.2 90.6  PLT 247 244   Medications:   (feeding supplement) PROSource Plus  30 mL Oral BID BM   allopurinol  300 mg Oral Daily   amLODipine  5 mg Oral QHS   aspirin  81 mg Oral Daily   atorvastatin  80 mg Oral Daily   clopidogrel  75 mg Oral Daily   furosemide  40 mg Oral Q T,Th,S,Su   heparin  5,000 Units Subcutaneous Q8H   hydrALAZINE  50 mg Oral BID   insulin aspart  0-9 Units Subcutaneous TID WC   insulin aspart  20 Units Subcutaneous TID WC   insulin glargine-yfgn  63 Units Subcutaneous QHS   losartan  100 mg Oral Daily   metoprolol tartrate  50 mg Oral BID   multivitamin  1 tablet Oral QHS   sevelamer carbonate  800 mg Oral TID WC    Dialysis Orders: MWF - [facility] 3:45hr, 400/800, EDW 115kg, 2K/2.5Ca bath, LUE AVF, 15g - unclear VDRA, ESA, heparin doses.  Assessment/Plan: Debility: In CIR S/p L pons CVA. Ongoing double vision. On  Plavix/ASA/statin. ESRD: Usual MWF schedule - for HD this evening. HTN/volume: BP stable, dose have mild LE edema. Below prior EDW - discussed aiming for 0.5L UFG, she will think about it.  Anemia of ESRD: Hgb 12.4 - no ESA. Secondary HPTH: Ca ok, Phos ^ - continue sevelamer as binder. Nutrition: On protein supp T2DM: Insulin, per primary. CAD, Hx CABG  Ozzie Hoyle, PA-C 12/05/2023, 3:05 PM  [facility]

## 2023-12-05 NOTE — Progress Notes (Signed)
Physical Therapy Session Note  Patient Details  Name: Sharon Arias MRN: [id number] Date of Birth: 02/20/1961  Today's Date: 12/05/2023 PT Individual Time: 6644-0347 PT Individual Time Calculation (min): 61 min   Short Term Goals: Week 1:  PT Short Term Goal 1 (Week 1): Pt will perform suipne<>sit mod-I PT Short Term Goal 2 (Week 1): Pt will perform sit<>stands using LRAD with supervision PT Short Term Goal 3 (Week 1): Pt will perform bed<>chair transfers using LRAD with supervision PT Short Term Goal 4 (Week 1): Pt will ambulate at least 154ft using LRAD with supervision  Skilled Therapeutic Interventions/Progress Updates:  Patient seated upright in w/c on entrance to room. Patient alert and agreeable to PT session.   Patient with no pain complaint at start of session. Does relate poor sleep the previous night d/t headache from vision/ vertigo/ dizziness issues.   Relates dizziness and diplopia. Does relates BPPV dx and tx previously with ENT. Also relates assessment performed  by PT on Sat. Was provided with convergence exercise to practice over weekend. Which pt can do until dizziness is provoked. Fearful of triggering nausea as she has had recently d/t the dizziness.   Therapeutic Activity: Transfers: Pt performed sit<>stand and stand pivot transfers throughout session with close supervision. Provided vc for BLE positioning and push-to-stand technique.  Gait Training:  Pt ambulated 185' x2  using rollator with CGA/ supervision while providing pt with vc re: directionality and obstacles in crowded hallway. Demonstrated good ability to manage rollator throughout and relates improved maneuverability. Provided vc/ tc for safe brake application prior to all transfers.   In watching other pt ambulate up/ down stairs, pt asks re: LE to lead with when ascending/ descending stairs. Education provided on biomechanics of "working" LE and need for leading ascent with stronger LE, bringing weaker  LE to same step. Then leading descent with weaker LE, bringing stronger LE to same step once stable. Pt demos understanding.   Neuromuscular Re-ed: NMR facilitated during session with focus on standing balance and motor control. Pt guided in placing and pulling of Squigz from mirror requiring one dynamic step to better position for reach. When placing Squigz, pt instructed to place provided item on called body part in mirror. When removing, pt instructed for hand R/L hand use and pull of specific color. Pt is able to follow instructions throughout and related minimal difficulty in finding color or body part d/t vision issues. Does request 2 seated rest breaks for dizziness. Resolves quickly in seated position. NMR performed for improvements in motor control and coordination, balance, sequencing, judgement, and self confidence/ efficacy in performing all aspects of mobility at highest level of independence.   Patient seated upright in recliner at end of session with brakes locked, seat pad alarm set, and all needs within reach.   Therapy Documentation Precautions:  Precautions Precautions: Fall, Posterior Hip Precaution Comments: low vision, vertical diplopia, spinning sensation with mobility and at rest Restrictions Weight Bearing Restrictions Per Provider Order: No  Pain: Pain Assessment Pain Scale: 0-10 Pain Score: 0-No pain related this session.   Therapy/Group: Individual Therapy  Loel Dubonnet 12/05/2023, 9:11 AM

## 2023-12-05 NOTE — Progress Notes (Signed)
Inpatient Rehabilitation Care Coordinator Assessment and Plan Patient Details  Name: Sharon Arias MRN: [id number] Date of Birth: June 12, 1961  Today's Date: 12/05/2023  Hospital Problems: Principal Problem:   Ischemic cerebrovascular accident (CVA) [facility])  Past Medical History:  Past Medical History:  Diagnosis Date   Anemia in chronic kidney disease 05/19/2016   Anxiety    Aortic atherosclerosis (HCC)    Blind    CAD (coronary artery disease)    Cardiac murmur    CHF (congestive heart failure) (HCC)    G2DD on 02/2020 TTE   Chicken pox    Chronic anticoagulation    CVA (cerebral vascular accident) (HCC)    DDD (degenerative disc disease), lumbar    Detached retina    Diabetic neuropathy (HCC)    Diabetic retinopathy (HCC)    legally blind   Dyspnea    ESRD needing dialysis (HCC)    Hx of CABG    Hyperlipidemia    Hypertension    NSTEMI (non-ST elevated myocardial infarction) (HCC) 06/2012   Obesity    Pneumonia    PONV (postoperative nausea and vomiting)    Sciatica of right side    Sleep apnea    CPAP NIGHTLY   T2DM (type 2 diabetes mellitus) (HCC)    Past Surgical History:  Past Surgical History:  Procedure Laterality Date   A/V FISTULAGRAM Left 04/21/2021   Procedure: A/V FISTULAGRAM;  Surgeon: Renford Dills, MD;  Location: [facility] INVASIVE CV LAB;  Service: Cardiovascular;  Laterality: Left;   A/V FISTULAGRAM Left 11/02/2022   Procedure: A/V Fistulagram;  Surgeon: Renford Dills, MD;  Location: [facility] INVASIVE CV LAB;  Service: Cardiovascular;  Laterality: Left;   AV FISTULA PLACEMENT Left 02/06/2021   Procedure: ARTERIOVENOUS (AV) FISTULA CREATION (BRACHIAL CEPHALIC );  Surgeon: Renford Dills, MD;  Location: [facility] ORS;  Service: Vascular;  Laterality: Left;   COLONOSCOPY WITH PROPOFOL N/A 01/26/2016   Procedure: COLONOSCOPY WITH PROPOFOL;  Surgeon: Christena Deem, MD;  Location: [facility] ENDOSCOPY;  Service: Endoscopy;  Laterality: N/A;   COLONOSCOPY WITH  PROPOFOL N/A 01/27/2016   Procedure: COLONOSCOPY WITH PROPOFOL;  Surgeon: Christena Deem, MD;  Location: [facility] ENDOSCOPY;  Service: Endoscopy;  Laterality: N/A;   CORONARY ARTERY BYPASS GRAFT  2013   DIALYSIS/PERMA CATHETER INSERTION N/A 02/03/2021   Procedure: DIALYSIS/PERMA CATHETER INSERTION;  Surgeon: Renford Dills, MD;  Location: [facility] INVASIVE CV LAB;  Service: Cardiovascular;  Laterality: N/A;   DIALYSIS/PERMA CATHETER INSERTION N/A 06/16/2021   Procedure: DIALYSIS/PERMA CATHETER INSERTION;  Surgeon: Annice Needy, MD;  Location: [facility] INVASIVE CV LAB;  Service: Cardiovascular;  Laterality: N/A;   DIALYSIS/PERMA CATHETER REMOVAL N/A 09/22/2021   Procedure: DIALYSIS/PERMA CATHETER REMOVAL;  Surgeon: Renford Dills, MD;  Location: [facility] INVASIVE CV LAB;  Service: Cardiovascular;  Laterality: N/A;   INCISION AND DRAINAGE     chest abscess   INCISION AND DRAINAGE ABSCESS N/A 12/11/2018   Procedure: INCISION AND DRAINAGE PERINEAL;  Surgeon: Leafy Ro, MD;  Location: [facility] ORS;  Service: General;  Laterality: N/A;   RETINAL LASER PROCEDURE     Social History:  reports that she has never smoked. She has never been exposed to tobacco smoke. She has never used smokeless tobacco. She reports that she does not drink alcohol and does not use drugs.  Family / Support Systems Marital Status: Widow/Widower How Long?: husband passed in July 2024 (had cancer) Spouse/Significant Other: Widowed Children: two adult children Other Supports: dtr Lillia Abed Anticipated Caregiver: Pt dtr  Lillia Abed Ability/Limitations of Caregiver: [name] will primarily be in the home with pt during the day; pt son will also help in alternating nights to help assist as well Caregiver Availability: 24/7 Family Dynamics: pt lives alone. 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 She is currently disabled and has been for 10-12 years. Education: 2 years college Health Literacy - How often do you need to have someone help you when you read instructions, pamphlets, or other written material from your doctor or pharmacy?: Sometimes (visually impaired) Writes: Yes Employment Status: Disabled Date Retired/Disabled/Unemployed: for 10-12 years Legal History/Current Legal Issues: Denies Guardian/Conservator: HCPOA will be redone since passing of husband. Intends to have her dtr Lillia Abed listed.   Abuse/Neglect Abuse/Neglect Assessment Can Be Completed: Yes Physical Abuse: Denies Verbal Abuse: Denies Sexual Abuse: Denies Exploitation of patient/patient's resources: Denies Self-Neglect: Denies  Patient response to: Social Isolation - How often do you feel lonely or isolated from those around you?: Never  Emotional Status Pt's affect, behavior and adjustment status: Pt in good spirits at time of visit, despite not feeling well due to having dizziness. Reports her head is spinning and sits with her eyes closed to help control the discomfort. Recent Psychosocial Issues: Denies Psychiatric History: Denies Substance Abuse History: Denies  Patient / Family Perceptions, Expectations & Goals Pt/Family understanding of illness & functional limitations: Pt and family have a general understanding of pt care needs Premorbid pt/family roles/activities: Independent with most ADLs; gets assistance with housecleaning as she has a hired housekeeper Anticipated changes in roles/activities/participation: continued assistance with ADLs/IADLs Pt/family expectations/goals: pt goal is to get head from spinning and to continue to focus on her balance.  Community Resources [employer]: None Premorbid Home Care/DME Agencies: None Transportation available at discharge: TBD Is the patient able to respond to transportation needs?:  Yes In the past 12 months, has lack of transportation kept you from medical appointments or from getting medications?: No In the past 12 months, has lack of transportation kept you from meetings, work, or from getting things needed for daily living?: No Resource referrals recommended: Neuropsychology  Discharge Planning Living Arrangements: Alone Support Systems: Children Type of Residence: Private residence Insurance Resources: Media planner (specify) ([employer] Medicare) Financial Resources: Social Security Financial Screen Referred: No Living Expenses: Own Money Management: Patient Does the patient have any problems obtaining your medications?: No Home Management: Pt prepares all meals. she states she has a housekeeper that comes in to help clean her home. Patient/Family Preliminary Plans: No changes Care Coordinator Barriers to Discharge: Decreased caregiver support, Lack of/limited family support, Insurance for SNF coverage Care Coordinator Anticipated Follow Up Needs: HH/OP Expected length of stay: 10-14 days  Clinical Impression This SW covering for primary SW, [employer].   SW met with pt in room to introduce self, explain role, discuss discharge process, and inform on ELOS. Pt is not a Cytogeneticist. DME: canes, RW, rollator, 3in1 BSC, electric scooter, and shower chair. Pt reports she would  like a TTB. Pt aware primary SW will follow-up with updates after team conference.   Allisson Schindel A Vashon Riordan 12/05/2023, 2:35 PM

## 2023-12-05 NOTE — Progress Notes (Signed)
Speech Language Pathology Daily Session Note  Patient Details  Name: SOLANGE EWER MRN: [id number] Date of Birth: February 03, 1961  Today's Date: 12/05/2023 SLP Individual Time: 3086-5784 SLP Individual Time Calculation (min): 44 min  Short Term Goals: Week 1: SLP Short Term Goal 1 (Week 1): STGs = LTGs d/t short ST rehab course  Skilled Therapeutic Interventions:  Patient was seen in am to address speech intelligibility. Pt was alert and seated upright in recliner upon SLP arrival. She c/o dizziness and eloquently apologized for keeping her eyes closed throughout session. Pt reports perception of her speech being "slurred." Upon initial observation pt completley intelligible. SLP observed left side facial droop however appeared pt compensated well for it. SLP introduced speech intelligibility strategies of over articulation and separating words. SLP engaged pt in guided conversational exchange. Pt ~100% intelligible throughout session. At conclusion of session, pt endorsed decreased speech intelligibility as fatigue increases. Pt may benefit from one additional session to address education of speech intelligibility strategies and energy conservation. SLP to continue POC.   Pain Pain Assessment Pain Scale: 0-10 Pain Score: 0-No pain  Therapy/Group: Individual Therapy  Renaee Munda 12/05/2023, 4:27 PM

## 2023-12-05 NOTE — Progress Notes (Signed)
Patient ID: Sharon Arias, female   DOB: 1961-08-18, 62 y.o.   MRN: [id number]  SW covering for primary SW, Lavera Guise.  1130- SW spoke with pt dtr Mardella Layman to introduce self, explain role, discuss discharge process, and inform on ELOS. 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 She is aware primary SW will follow-up to provide updates from team conference.   Cecile Sheerer, MSW, LCSW Office: [phone] Cell: [phone] Fax: [phone]

## 2023-12-05 NOTE — Progress Notes (Signed)
Occupational Therapy Session Note  Patient Details  Name: Sharon Arias MRN: [id number] Date of Birth: [birth year]  Today's Date: 12/05/2023 OT Individual Time: 0805-0900 OT Individual Time Calculation (min): 55 min  Session 2: OT Individual Time: 7829-5621 OT Individual Time Calculation (min): 24 min   Short Term Goals: Week 1:  OT Short Term Goal 1 (Week 1): Patient to perform LE dresing with set up and vc's using AE as needed OT Short Term Goal 2 (Week 1): Patient to tolerate standing grooming at sink x 3 minutes SBA for safety OT Short Term Goal 3 (Week 1): Patien to perform toileting with SPV using DME as needed  Skilled Therapeutic Interventions/Progress Updates:     Session 1:  Pt received sitting semi-reclined in bed presenting to be in good spirits receptive to skilled OT session reporting 6/10 pain d/t headache with medications provided prior to OT session- OT offering intermittent rest breaks, repositioning, and therapeutic support to optimize participation in therapy session. Pt reporting extreme dizziness and double vision upon OT arrival stating "I feel like the room is spinning and it won't stop" and "I see four of you". Focused this session on sitting upright tolerance, Pt education, and functional mobility training. Pt came to EOB with CGA +increased time to accommodate for dizziness symptoms. Spent significant amount of time sitting EOB to allow for Pt's body to accommodate to dizziness symptoms and upright sitting posture. Engaged pt in d/c planning discussion with Pt reporting she plans to return to her single lvl home and that her DTR is going to move into the home with her and Pt will live in apartment attached to the home. Pt 's DTR works remotely and is able to provide 24/7 supervision for Pt. Education provided on purpose of IPR and follow-up therapy services. Since Pt does not drive and leaving the home uses up large portion of Pt's energy, Pt requesting to receive HHOT  services over OPOT. Pt also reporting she did not sleep well last night with education provided on sleep hygiene techniques with Pt receptive to education. Pt stating her CPAP mask did not fit properly and kept her awake most of the night leading to increased fatigue and increase dizziness today- spent time during session adjusting Pt's CPAP mask for improved fit to improve pt's sleep for improved participation in therapy sessions. Education provided on CVA etiology and recovery process, dizziness caused by vestibular system vs central origin, and double vision recovery/accomodation techniques/exercises with Pt receptive to education and motivated to learn. Pt dressed for the day upon OT arrival, Pt requesting to complete grooming hygiene tasks during session. Pt able to complete oral hygiene and groom hair with supervision sitting EOB. Engaged Pt in completing short distance functional mobility to increase upright standing tolerance and independence in functional mobility. Sit > stand using RW CGA. Pt ambulated to wc ~91ft using RW with min A provided for balance and mod verbal cues for safety with RW management. Pt was left resting in wc with call bell in reach, seatbelt alarm on, and all needs met.    Session 2:  Pt received sleeping in recliner waking upon OT arrival. Pt presenting to be fatigued, however in good spirits receptive to skilled OT session reporting 0/10 pain- OT offering intermittent rest breaks, repositioning, and therapeutic support to optimize participation in therapy session. Pt continuing to present with significant dizziness limiting her independence in BADLs. Pt requesting to use restroom during session. Engaged Pt in completing functional mobility to BR  using rollator with CGA to light min A provided for balance with single LOB present when Pt was crossing threshold into BR. Pt able to complete 3/3 toileting tasks with CGA following continent void- documented in flowsheets. Pt completed  functional mobility back to room using rollator CGA and stood at sink to wash hands with CGA provided for balance. Pt completed functional mobility to EOB and returned to bed with CGA. Pt receptive to sitting up in bed to improve upright tolerance. Pt was left resting in bed with call bell in reach, bed alarm on, and all needs met.    Therapy Documentation Precautions:  Precautions Precautions: Fall, Posterior Hip Precaution Comments: low vision, vertical diplopia, spinning sensation with mobility and at rest Restrictions Weight Bearing Restrictions Per Provider Order: No   Therapy/Group: Individual Therapy  Clide Deutscher 12/05/2023, 7:50 AM

## 2023-12-05 NOTE — IPOC Note (Signed)
Overall Plan of Care [facility]) Patient Details Name: Sharon Arias MRN: [id number] DOB: 12/23/1960  Admitting Diagnosis: Ischemic cerebrovascular accident (CVA) [facility])  Hospital Problems: Principal Problem:   Ischemic cerebrovascular accident (CVA) (HCC)     Functional Problem List: Nursing Safety, Bowel, Endurance, Medication Management  PT Balance, Perception, Safety, Sensory, Endurance, Motor, Pain  OT Balance, Vision, Endurance, Motor  SLP Motor  TR         Basic ADL's: OT Grooming, Bathing, Dressing, Toileting     Advanced  ADL's: OT Simple Meal Preparation     Transfers: PT Bed Mobility, Bed to Chair, Car, Furniture, Floor  OT Toilet, Tub/Shower     Locomotion: PT Ambulation, Stairs     Additional Impairments: OT Fuctional Use of Upper Extremity  SLP Communication expression    TR      Anticipated Outcomes Item Anticipated Outcome  Self Feeding Independent  Swallowing      Basic self-care  Modified independent  Toileting  Modified Independent   Bathroom Transfers Modified Independent  Bowel/Bladder  manage w mod I  Transfers  mod-I using LRAD  Locomotion  mod-I using LRAD for household level mobility  Communication  Mod I  Cognition     Pain  n/a  Safety/Judgment  manage w cues   Therapy Plan: PT Intensity: Minimum of 1-2 x/day ,45 to 90 minutes PT Frequency: 5 out of 7 days PT Duration Estimated Length of Stay: ~1.5 weeks OT Intensity: Minimum of 1-2 x/day, 45 to 90 minutes OT Frequency: 5 out of 7 days OT Duration/Estimated Length of Stay: 7-14 days SLP Intensity: Minumum of 1-2 x/day, 30 to 90 minutes SLP Frequency: 1 to 3 out of 7 days SLP Duration/Estimated Length of Stay: 10-14 days (less for speech)   Team Interventions: Nursing Interventions Bowel Management, Medication Management, Disease Management/Prevention, Patient/Family Education, Discharge Planning  PT interventions Ambulation/gait training, Community reintegration,  DME/adaptive equipment instruction, Neuromuscular re-education, Psychosocial support, Museum/gallery curator, UE/LE Strength taining/ROM, Warden/ranger, Discharge planning, Pain management, Skin care/wound management, Therapeutic Activities, UE/LE Coordination activities, Disease management/prevention, Functional mobility training, Patient/family education, Therapeutic Exercise, Visual/perceptual remediation/compensation  OT Interventions Balance/vestibular training, Disease mangement/prevention, Neuromuscular re-education, Self Care/advanced ADL retraining, Therapeutic Exercise, DME/adaptive equipment instruction, UE/LE Strength taining/ROM, UE/LE Coordination activities, Patient/family education, Discharge planning, Functional mobility training, Therapeutic Activities, Visual/perceptual remediation/compensation  SLP Interventions Speech/Language facilitation, Patient/family education  TR Interventions    SW/CM Interventions Discharge Planning, Psychosocial Support, Patient/Family Education   Barriers to Discharge MD   Fall risk, uncontrolled DM,. Severe retinopathy and ESRD   Nursing Decreased caregiver support 1 level ramped entry solo; mod I at baseline, does not drive 2/2 legally blind, has groceries delivered and uses [facility] for transport to appointments, dtr and family will assist prn (spouse passed July 2024)  PT Decreased caregiver support    OT Hemodialysis    SLP      SW Insurance for SNF coverage     Team Discharge Planning: Destination: PT-Home ,OT- Home , SLP-Home Projected Follow-up: PT-Home health PT, 24 hour supervision/assistance, OT-  Home health OT, SLP-None Projected Equipment Needs: PT-To be determined, OT- Tub/shower bench, SLP-None recommended by SLP Equipment Details: PT- , OT-  Patient/family involved in discharge planning: PT- Patient,  OT-Patient, SLP-Patient  MD ELOS: 7d Medical Rehab Prognosis:  Good Assessment: The patient has been admitted for CIR  therapies with the diagnosis of CVA. The team will be addressing functional mobility, strength, stamina, balance, safety, adaptive techniques and equipment, self-care, bowel and bladder mgt,  patient and caregiver education, fall risk reduction . Goals have been set at Mod I. Anticipated discharge destination is Home .        See Team Conference Notes for weekly updates to the plan of care

## 2023-12-06 LAB — HEPATITIS B SURFACE ANTIBODY, QUANTITATIVE: Hep B S AB Quant (Post): 37.2 m[IU]/mL

## 2023-12-06 LAB — GLUCOSE, CAPILLARY
Glucose-Capillary: 185 mg/dL — ABNORMAL HIGH (ref 70–99)
Glucose-Capillary: 168 mg/dL — ABNORMAL HIGH (ref 70–99)
Glucose-Capillary: 160 mg/dL — ABNORMAL HIGH (ref 70–99)
Glucose-Capillary: 135 mg/dL — ABNORMAL HIGH (ref 70–99)

## 2023-12-06 MED ORDER — CHLORHEXIDINE GLUCONATE CLOTH 2 % EX PADS
6.0000 | MEDICATED_PAD | Freq: Every day | CUTANEOUS | Status: DC
  Administered 2023-12-10 – 2023-12-13 (×2): 6 via TOPICAL

## 2023-12-06 MED ORDER — SCOPOLAMINE 1 MG/3DAYS TD PT72
1.0000 | MEDICATED_PATCH | TRANSDERMAL | Status: DC
  Administered 2023-12-06: 1.5 mg via TRANSDERMAL
  Filled 2023-12-06: qty 1

## 2023-12-06 NOTE — Plan of Care (Signed)
  Problem: RH Expression Communication Goal: LTG Patient will increase speech intelligibility (SLP) Description: LTG: Patient will increase speech intelligibility at word/phrase/conversation level with cues, % of the time (SLP) Outcome: Completed/Met Flowsheets Taken 12/06/2023 1612 by Renaee Munda, CCC-SLP LTG: Patient will increase speech intelligibility (SLP): Modified Independent Level: Conversation level Taken 12/03/2023 1258 by Gerda Diss R, CCC-SLP Percent of time patient will use intelligible speech: 100

## 2023-12-06 NOTE — Progress Notes (Cosign Needed Addendum)
  [facility] Progress Note   Subjective:  Seen in room - sitting in recliner. Had awful time with HD yesterday. Removed and had severe cramping, treatment ended 40 minutes early. Also, HD done later than she was anticipating. Tired today.  Objective Vitals:   12/06/23 0258 12/06/23 0437 12/06/23 0438 12/06/23 0800  BP: (!) 134/54 (!) 140/54  (!) 125/55  Pulse: 61 63  63  Resp: 18 18  17   Temp: 97.8 F (36.6 C) 98.4 F (36.9 C)  97.9 F (36.6 C)  TempSrc:    Oral  SpO2: 96% 97%  99%  Weight:   114.7 kg   Height:       Physical Exam General: Well appearing, NAD. Room air. L facial droop Heart: RRR; no murmur Lungs: CTA anteriorly Abdomen: soft Extremities: No LE edema Dialysis Access: LUE AVF + t/b  Additional Objective Labs: Basic Metabolic Panel: Recent Labs  Lab 11/30/23 1044 12/02/23 1521 12/05/23 2037 12/05/23 2250  NA 138  --  141 141  K 4.0  --  4.7 4.0  CL 104  --  104 105  CO2 21*  --  26 23  GLUCOSE 157*  --  182* 117*  BUN 62*  --  73* 72*  CREATININE 4.63* 2.92* 5.26* 5.13*  CALCIUM 9.3  --  9.9 9.7  PHOS 5.9*  --  4.9* 4.6   Liver Function Tests: Recent Labs  Lab 11/30/23 1044 12/05/23 2037 12/05/23 2250  ALBUMIN 3.6 3.5 3.6   CBC: Recent Labs  Lab 11/30/23 1044 12/02/23 1521 12/05/23 2037 12/05/23 2250  WBC 11.2* 9.6 10.7* 11.0*  NEUTROABS  --   --  7.0 7.1  HGB 11.6* 12.4 11.3* 10.9*  HCT 34.0* 36.8 34.1* 33.9*  MCV 89.2 90.6 92.9 92.6  PLT 247 244 241 245   Medications:   (feeding supplement) PROSource Plus  30 mL Oral BID BM   allopurinol  300 mg Oral Daily   amLODipine  5 mg Oral QHS   aspirin  81 mg Oral Daily   atorvastatin  80 mg Oral Daily   clopidogrel  75 mg Oral Daily   furosemide  40 mg Oral Q T,Th,S,Su   heparin  5,000 Units Subcutaneous Q8H   hydrALAZINE  50 mg Oral BID   insulin aspart  0-9 Units Subcutaneous TID WC   insulin aspart  20 Units Subcutaneous TID WC   insulin glargine-yfgn   63 Units Subcutaneous QHS   losartan  100 mg Oral Daily   metoprolol tartrate  50 mg Oral BID   multivitamin  1 tablet Oral QHS   scopolamine  1 patch Transdermal Q72H   sevelamer carbonate  800 mg Oral TID WC    Dialysis Orders: MWF - [facility] 3:45hr, 400/800, EDW 115kg, 2K/2.5Ca bath, LUE AVF, 15g - unclear VDRA, ESA, heparin doses.   Assessment/Plan: Debility: In CIR S/p L pons CVA. Ongoing double vision. On Plavix/ASA/statin. ESRD: Usual MWF schedule - next HD tomorrow. HTN/volume: BP stable, UFG only yesterday and had cramping. Edema resolved today. Plan to run even next HD. Anemia of ESRD: Hgb 10.9 - no ESA for now. Secondary HPTH: Ca/Phos ok - continue sevelamer as binder. Nutrition: On protein supp T2DM: Insulin, per primary. CAD, Hx CABG  Ozzie Hoyle, PA-C 12/06/2023, 10:02 AM  [employer]

## 2023-12-06 NOTE — Progress Notes (Signed)
Pt returned from HD and is sleeping well on CPAP as set.   12/06/23 0530  BiPAP/CPAP/SIPAP  $ Non-Invasive Home Ventilator  Subsequent  BiPAP/CPAP/SIPAP Pt Type Adult  BiPAP/CPAP/SIPAP Resmed  Mask Type Full face mask  Mask Size Small  PEEP 8 cmH20  FiO2 (%) 21 %

## 2023-12-06 NOTE — Plan of Care (Signed)
  Problem: Consults Goal: RH STROKE PATIENT EDUCATION Description: See Patient Education module for education specifics  Outcome: Progressing   Problem: RH BOWEL ELIMINATION Goal: RH STG MANAGE BOWEL WITH ASSISTANCE Description: STG Manage Bowel with toileting Assistance. Outcome: Progressing Goal: RH STG MANAGE BOWEL W/MEDICATION W/ASSISTANCE Description: STG Manage Bowel with Medication with mod I Assistance. Outcome: Progressing   Problem: RH SAFETY Goal: RH STG ADHERE TO SAFETY PRECAUTIONS W/ASSISTANCE/DEVICE Description: STG Adhere to Safety Precautions With cues Assistance/Device. Outcome: Progressing   Problem: RH KNOWLEDGE DEFICIT Goal: RH STG INCREASE KNOWLEDGE OF DIABETES Description: Patient and family will be able to manage DM using educational resources for medications and dietary modifications independently Outcome: Progressing Goal: RH STG INCREASE KNOWLEDGE OF HYPERTENSION Description: Patient and family will be able to manage HTN using educational resources for medications and dietary modifications independently Outcome: Progressing Goal: RH STG INCREASE KNOWLEGDE OF HYPERLIPIDEMIA Description: Patient and family will be able to manage HLD using educational resources for medications and dietary modifications independently Outcome: Progressing Goal: RH STG INCREASE KNOWLEDGE OF STROKE PROPHYLAXIS Description: Patient and family will be able to manage secondary risks using educational resources for medications and dietary modifications independently Outcome: Progressing

## 2023-12-06 NOTE — Progress Notes (Signed)
   12/06/23 0200  Vitals  Temp 98.2 F (36.8 C)  Temp Source Oral  BP (!) 113/97  MAP (mmHg) 104  BP Location Right Arm  BP Method Automatic  Patient Position (if appropriate) Lying  Pulse Rate 64  Pulse Rate Source Monitor  ECG Heart Rate 63  Resp 12  Oxygen Therapy  SpO2 96 %  O2 Device Room Air  Patient Activity (if Appropriate) In bed  During Treatment Monitoring  Blood Flow Rate (mL/min) 0 mL/min  Arterial Pressure (mmHg) -1.61 mmHg  Venous Pressure (mmHg) -0.61 mmHg  TMP (mmHg) 28.89 mmHg  Ultrafiltration Rate (mL/min) 191 mL/min  Dialysate Flow Rate (mL/min) 299 ml/min  Dialysate Potassium Concentration 2  Dialysate Calcium Concentration 2.5  Duration of HD Treatment -hour(s) 2.81 hour(s)  Cumulative Fluid Removed (mL) per Treatment  468.13  HD Safety Checks Performed Yes  Intra-Hemodialysis Comments Tx completed  Dialysis Fluid Bolus Normal Saline  Post Treatment  Dialyzer Clearance Lightly streaked  Liters Processed 59.1  Fluid Removed (mL) 500 mL  Tolerated HD Treatment No (Comment)  Post-Hemodialysis Comments Pt experienced abdominal cramps  AVG/AVF Arterial Site Held (minutes) 7 minutes  AVG/AVF Venous Site Held (minutes) 7 minutes  Fistula / Graft Left Upper arm Arteriovenous fistula  No placement date or time found.   Orientation: Left  Access Location: Upper arm  Access Type: Arteriovenous fistula  Site Condition No complications  Fistula / Graft Assessment Present;Thrill;Bruit  Status Deaccessed  Needle Size 15  Drainage Description None   Pt signed off with forty minutes due to cramping.After goal was lowered, Pt still c/o cramping to abdomin.

## 2023-12-06 NOTE — Progress Notes (Signed)
Speech Language Pathology Discharge Summary  Patient Details  Name: Sharon Arias MRN: [id number] Date of Birth: Sep 12, 1961  Date of Discharge from SLP service:December 06, 2023  Today's Date: 12/06/2023 SLP Individual Time: 4098-1191 SLP Individual Time Calculation (min): 40 min and Today's Date: 12/06/2023 SLP Missed Time: 20 Minutes Missed Time Reason: Patient fatigue  Skilled Therapeutic Interventions:  Pt was seen in PM to address speech intelligibility. Pt was easily alerted upon SLP arrival and agreeable for session. Pt reports increased fatigue due to dialysis completed night before. Pt recalled speech intelligibility strategies discussed on previous date. She continued to report occasional instances of reduced speech intelligibility with fatigue. SLP continued discussion on energy conservation methods and use of overarticulation strategies when needed. SLP engaged pt in guided conversational exchanges with pt remaining 100% intelligible indep throughout session. Pt confirms that family, friends, and unfamiliar listeners have not had difficulty understanding her. Pt requested session discontinuation due to fatigue. SLP instructed pt regarding plan for discharge with pt in agreement. SLP to sign off at this time.   Patient has met 1 of 1 long term goals.  Patient to discharge at overall Modified Independent level.  Reasons goals not met:     Clinical Impression/Discharge Summary: Pt has made excellent gains and has met 1 of 1 LTG's this admission due to improved speech intelligibility. Pt/ family education complete and pt will remain in CIR program to address PT and OT needs. No follow up skilled intervention warranted at this time or at next venue of care.  Care Partner:  Caregiver Able to Provide Assistance: Yes  Type of Caregiver Assistance: Physical  Recommendation:  None     Equipment: none   Reasons for discharge: Treatment goals met   Patient/Family Agrees with Progress  Made and Goals Achieved: Yes    Renaee Munda 12/06/2023, 4:14 PM

## 2023-12-06 NOTE — Progress Notes (Signed)
Physical Therapy Session Note  Patient Details  Name: Sharon Arias MRN: [id number] Date of Birth: 04-23-1961  Today's Date: 12/06/2023 PT Individual Time: 2725-3664  PT Individual Time Calculation (min): 43 min  and  Today's Date: 12/06/2023 PT Missed Time: 30 Minutes  Missed Time Reason: Patient ill (Comment);Patient fatigue (nauseous)  Short Term Goals: Week 1:  PT Short Term Goal 1 (Week 1): Pt will perform suipne<>sit mod-I PT Short Term Goal 2 (Week 1): Pt will perform sit<>stands using LRAD with supervision PT Short Term Goal 3 (Week 1): Pt will perform bed<>chair transfers using LRAD with supervision PT Short Term Goal 4 (Week 1): Pt will ambulate at least 138ft using LRAD with supervision  Skilled Therapeutic Interventions/Progress Updates:    Session 1: Pt received sitting in recliner reporting she is absolutely exhausted, but is agreeable to limited therapy session from seated position. Pt stated last night was "probably the worst night of my life" and then goes on to say "normally dialysis is rough on me anyway, but then you add all of this on...it drains me so much." Pt states she hasn't had any rest.   Pt agreeable with plan to focus session on oculomotor exercises to improve her diplopia and vertigo symptoms. Pt reports she performed the convergence exercise and feels her diplopia is improving although still present at all times. This therapist noticed pt keeping her eyes and especially her R eye open more today compared to at initial eval.   Performed oculomotor exercises as follows using pink and purples post-its taped on spoons:  - convergence in/out x10 reps  - horizontal smooth pursuits 2x 10 reps  - vertical smooth pursuits 2x 10reps - horizontal saccades: 2 x10reps - vertical saccades: 2x 10reps - improvement in movement with less "swooping" movement with R eye  Noticed slight L head tilt, cued for improved vertical upright. Noticed R eye still not tracking  perfectly with L eye. Pt reports these eye exercises "really feel like a workout."   Provided pt with printout of HEP because she states she can take a picture of the paper and zoom in to read it and help her remember how to correctly perform the exercises.  Access Code: [email or web address] URL: [email or web address] Date: 12/06/2023 Prepared by: Casimiro Needle  Exercises - Seated Horizontal Smooth Pursuit  - 1 x daily - 7 x weekly - 3 sets - 10 reps - Seated Vertical Smooth Pursuit  - 1 x daily - 7 x weekly - 3 sets - 10 reps - Seated Horizontal Saccades  - 1 x daily - 7 x weekly - 3 sets - 10 reps - Seated Vertical Saccades  - 1 x daily - 7 x weekly - 3 sets - 10 reps  At end of session, pt left seated in recliner with needs in reach.  Session 2: Pt received sitting in recliner with her head supported in her hands. She reports feeling "nauseous" at this time due to feeling like she is spinning while sitting at rest. Pt politely declines participation in therapy at this time due to having a bad day and currently not feeling well. Pt left seated in recliner with needs in reach. Missed 30 minutes of skilled physical therapy.  Therapy Documentation Precautions:  Precautions Precautions: Fall, Posterior Hip Precaution Comments: low vision, vertical diplopia, spinning sensation with mobility and at rest Restrictions Weight Bearing Restrictions Per Provider Order: No   Pain:  Session 1: No reports of pain throughout session.  Session 2: No reports  of pain throughout session.    Therapy/Group: Individual Therapy  Ginny Forth , PT, DPT, NCS, CSRS 12/06/2023, 12:58 PM

## 2023-12-06 NOTE — Progress Notes (Addendum)
PROGRESS NOTE   Subjective/Complaints:  Patient evaluated bedside this morning. Patient reports that she is still dizzy and dialysis was rough on her yesterday. Removed 500 mL. She also reports being nauseated.   ROS:   Pt denies shortness of breath or chest pain.    Negative except for HPI-   Objective:   No results found. Recent Labs    12/05/23 2037 12/05/23 2250  WBC 10.7* 11.0*  HGB 11.3* 10.9*  HCT 34.1* 33.9*  PLT 241 245   Recent Labs    12/05/23 2037 12/05/23 2250  NA 141 141  K 4.7 4.0  CL 104 105  CO2 26 23  GLUCOSE 182* 117*  BUN 73* 72*  CREATININE 5.26* 5.13*  CALCIUM 9.9 9.7    Intake/Output Summary (Last 24 hours) at 12/06/2023 0718 Last data filed at 12/06/2023 0200 Gross per 24 hour  Intake 476 ml  Output 500 ml  Net -24 ml        Physical Exam: Vital Signs Blood pressure (!) 140/54, pulse 63, temperature 98.4 F (36.9 C), resp. rate 18, height 5\' 7"  (1.702 m), weight 114.7 kg, SpO2 97%.   General: Patient is resting comfortably in bed in no acute distress  Eyes: PEARLA and EOMI, no nystagmus Cardio: Regular rate and rhythm, no murmurs, rubs or gallops. 2+ pulses to bilateral upper and lower extremities  Chest: No chest tenderness Pulmonary: Clear to ausculation bilaterally with no rales, rhonchi, and crackles  MSK: 5/5 strength to upper and lower extremities.  Neuro: Left upper extremity ataxia appreciated on finger-nose testing, no nystagmus      Assessment/Plan: 1. Functional deficits which require 3+ hours per day of interdisciplinary therapy in a comprehensive inpatient rehab setting. Physiatrist is providing close team supervision and 24 hour management of active medical problems listed below. Physiatrist and rehab team continue to assess barriers to discharge/monitor patient progress toward functional and medical goals  Care Tool:  Bathing    Body parts bathed  by patient: Right arm, Left upper leg, Right lower leg, Left arm, Chest, Left lower leg, Abdomen, Face, Front perineal area, Buttocks, Right upper leg         Bathing assist Assist Level: Contact Guard/Touching assist     Upper Body Dressing/Undressing Upper body dressing   What is the patient wearing?: Pull over shirt    Upper body assist Assist Level: Set up assist    Lower Body Dressing/Undressing Lower body dressing      What is the patient wearing?: Underwear/pull up, Pants     Lower body assist Assist for lower body dressing: Moderate Assistance - Patient 50 - 74%     Toileting Toileting    Toileting assist Assist for toileting: Contact Guard/Touching assist     Transfers Chair/bed transfer  Transfers assist     Chair/bed transfer assist level: Minimal Assistance - Patient > 75%     Locomotion Ambulation   Ambulation assist      Assist level: Minimal Assistance - Patient > 75% Assistive device: Hand held assist Max distance: 151ft   Walk 10 feet activity   Assist     Assist level: Minimal Assistance - Patient >  75% Assistive device: Hand held assist   Walk 50 feet activity   Assist    Assist level: Minimal Assistance - Patient > 75% Assistive device: Hand held assist    Walk 150 feet activity   Assist Walk 150 feet activity did not occur: Safety/medical concerns         Walk 10 feet on uneven surface  activity   Assist     Assist level: Minimal Assistance - Patient > 75% Assistive device: Hand held assist, Other (comment) (railing)   Wheelchair     Assist Is the patient using a wheelchair?: Yes (to/from gym for energy conservation - uses an electric scooter for longer distances baseline) Type of Wheelchair: Manual    Wheelchair assist level: Dependent - Patient 0%      Wheelchair 50 feet with 2 turns activity    Assist        Assist Level: Dependent - Patient 0%   Wheelchair 150 feet activity      Assist      Assist Level: Dependent - Patient 0%   Blood pressure (!) 140/54, pulse 63, temperature 98.4 F (36.9 C), resp. rate 18, height 5\' 7"  (1.702 m), weight 114.7 kg, SpO2 97%.  Medical Problem List and Plan: 1. Functional deficits secondary to ischemic infarct left brachium pontis secondary to small vessel disease             -patient may  shower             -ELOS/Goals: 10-14 days, supervision PT, supervision OT, modified independent SLP               - Stable for admission to IRF   Con't CIR PT and OT  2.  Antithrombotics: -DVT/anticoagulation:  Pharmaceutical: Heparin             -antiplatelet therapy: Aspirin 81 mg daily and Plavix 75 mg daily as prior to admission 3. Pain Management: Tylenol as needed  12/14- denies pain- con't tlyenol prn 4. Mood/Behavior/Sleep: Provide emotional support             -antipsychotic agents: N/A 5. Neuropsych/cognition: This patient is capable of making decisions on her own behalf. 6. Skin/Wound Care: Routine skin checks 7. Fluids/Electrolytes/Nutrition: Routine in and outs with follow-up chemistries 8.  End-stage renal disease.  Continue hemodialysis as directed-patient reports dialysis overnight yesterday.  Did not tolerate well.  500 mL removed.  Interesting enough, patient reports having robust urine output.  Refused Lasix this morning.  Will continue with dialysis per nephro. 9.  CAD with CABG 2013.  No chest pain or shortness of breath.  Continue aspirin and Plavix 10.  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  Hemoglobin A1c 8.1. Novolog 20 units 3 times daily with meals. Semglee 63 units nightly.  Check blood sugars before meals and at bedtime.  Blood glucose levels measuring well.  CBG (last 3)  Recent Labs    12/05/23 1618 12/05/23 2155 12/06/23 0657  GLUCAP 131* 143* 185*  Fair control   11.  Hypertension.  Cozaar 100 mg daily, hydralazine 50 mg twice daily, Norvasc 5 mg daily, Lopressor 50 mg twice  daily.  Monitor with increased mobility.  Blood pressure measuring well.   Vitals:   12/06/23 0258 12/06/23 0437  BP: (!) 134/54 (!) 140/54  Pulse: 61 63  Resp: 18 18  Temp: 97.8 F (36.6 C) 98.4 F (36.9 C)  SpO2: 96% 97%    12.  Hyperlipidemia.  Lipitor  13.  Diastolic congestive heart failure.  Lasix 40 mg Tuesday Thursday Saturday Sunday.  Monitor for any signs of fluid overload 12/17- no increase in LE edema, no concerns about fluid overload, patient did have dialysis last night which 500 ml was removed. 14.  History of gout.  Zyloprim 300 mg daily.  Monitor for any gout flares 15.Obesity.  BMI 39.50.  Dietary follow-up  12/14- BMI 38.81 - cont' to monitor and might be worth a dietitian consult 16.  Anemia of chronic disease.  Follow-up CBC.  12/17- Hb 10.9- no acute concerns for bleeding   17. Constipation, improving  12/17- LBM 2 days ago- normal for her no acute concerns    18.  Diabetic retinopathy f/u optho   19.  Dizziness: Plan to try scopolamine patch today.  LOS: 4 days A FACE TO FACE EVALUATION WAS PERFORMED  Modena Slater 12/06/2023, 7:18 AM  Internal medicine Resident PGY-2

## 2023-12-06 NOTE — Progress Notes (Signed)
Pt scheduled for HD. Offered to set up in HD unit. Pt will call if sleeps. Unit set for CPAP +8 per pt. Different full face mask placed @ bedside per pt complaint of current mask fit. Pt will try and have someone bring in her Home mask.   12/05/23 2150  BiPAP/CPAP/SIPAP  BiPAP/CPAP/SIPAP Pt Type Adult  BiPAP/CPAP/SIPAP Resmed  PEEP 8 cmH20  FiO2 (%) 21 %

## 2023-12-06 NOTE — Progress Notes (Signed)
Occupational Therapy Session Note  Patient Details  Name: Sharon Arias MRN: [id number] Date of Birth: 1961/08/16  Today's Date: 12/06/2023 OT Individual Time: 5621-3086 OT Individual Time Calculation (min): 36 min    Short Term Goals: Week 1:  OT Short Term Goal 1 (Week 1): Patient to perform LE dresing with set up and vc's using AE as needed OT Short Term Goal 2 (Week 1): Patient to tolerate standing grooming at sink x 3 minutes SBA for safety OT Short Term Goal 3 (Week 1): Patien to perform toileting with SPV using DME as needed  Skilled Therapeutic Interventions/Progress Updates:     Pt received sitting up in bed with eyes closed, holding her head down. Pt presenting to be extremely fatigued and upset d/t being taken to dialysis late last night. Per pt report, she requires a good night of sleep to recover from dialysis in order to be able to participate well in therapies. Pt frustrated that she is tired this AM as she desires to work hard in therapy, but is limited by fatigue and dizziness this AM as she stayed awake the entire night after returning from dialysis at 3;30 AM. OT provided therapeutic support, encouragement, and gentle education on importance of rest to support CVA recovery with Pt receptive to education. Pt requesting to complete light ADLs this AM and then return to her recliner to sleep. Pt reporting 0/10 pain this session.   Engaged Pt in completing functional mobility to bathroom using rollator with min A provided for balance 2/2 fatigue and min verbal cues for safety with rollator as Pt was keeping single eye closed to accommodate for diplopia. Provided increased time on toilet as Pt requires increased time to void, however no void at this time. Pt able to complete clothing management during toileting with CGA using RW and grab bars for balance. Pt completed functional mobility back to her room with light min A for balance and navigation with rollator. Pt attempted to stand  at sink to wash hands and complete grooming/hygiene tasks, however Pt too fatigued and dizzy to tolerate standing at this time. Pt ambulated to recliner using RW. Opted to complete grooming/hygiene tasks in seated position for energy conservation with pt able to groom hair and wash face with supervision.   MD in/out at end of therapy session for morning rounding. Pt was left resting in recliner with call bell in reach, chair alarm on, and all needs met.  Missed 39 minutes of skilled OT treatment, will attempt to make up time as schedule and Pt's status allows.   Therapy Documentation Precautions:  Precautions Precautions: Fall, Posterior Hip Precaution Comments: low vision, vertical diplopia, spinning sensation with mobility and at rest Restrictions Weight Bearing Restrictions Per Provider Order: No  Therapy/Group: Individual Therapy  Clide Deutscher 12/06/2023, 7:56 AM

## 2023-12-07 LAB — URINALYSIS, W/ REFLEX TO CULTURE (INFECTION SUSPECTED)
Bilirubin Urine: NEGATIVE
Glucose, UA: 50 mg/dL — AB
Ketones, ur: NEGATIVE mg/dL
Nitrite: NEGATIVE
Protein, ur: 100 mg/dL — AB
Specific Gravity, Urine: 1.015 (ref 1.005–1.030)
pH: 5 (ref 5.0–8.0)

## 2023-12-07 LAB — RENAL FUNCTION PANEL
Albumin: 3.7 g/dL (ref 3.5–5.0)
Anion gap: 13 (ref 5–15)
BUN: 65 mg/dL — ABNORMAL HIGH (ref 8–23)
CO2: 26 mmol/L (ref 22–32)
Calcium: 9.9 mg/dL (ref 8.9–10.3)
Chloride: 102 mmol/L (ref 98–111)
Creatinine, Ser: 5.05 mg/dL — ABNORMAL HIGH (ref 0.44–1.00)
GFR, Estimated: 9 mL/min — ABNORMAL LOW (ref >60)
Glucose, Bld: 174 mg/dL — ABNORMAL HIGH (ref 70–99)
Phosphorus: 5.4 mg/dL — ABNORMAL HIGH (ref 2.5–4.6)
Potassium: 4.6 mmol/L (ref 3.5–5.1)
Sodium: 141 mmol/L (ref 135–145)

## 2023-12-07 LAB — GLUCOSE, CAPILLARY
Glucose-Capillary: 142 mg/dL — ABNORMAL HIGH (ref 70–99)
Glucose-Capillary: 175 mg/dL — ABNORMAL HIGH (ref 70–99)
Glucose-Capillary: 160 mg/dL — ABNORMAL HIGH (ref 70–99)

## 2023-12-07 LAB — CBC
HCT: 33.3 % — ABNORMAL LOW (ref 36.0–46.0)
Hemoglobin: 11.2 g/dL — ABNORMAL LOW (ref 12.0–15.0)
MCH: 31 pg (ref 26.0–34.0)
MCHC: 33.6 g/dL (ref 30.0–36.0)
MCV: 92.2 fL (ref 80.0–100.0)
Platelets: 233 10*3/uL (ref 150–400)
RBC: 3.61 MIL/uL — ABNORMAL LOW (ref 3.87–5.11)
RDW: 14.2 % (ref 11.5–15.5)
WBC: 12.4 10*3/uL — ABNORMAL HIGH (ref 4.0–10.5)
nRBC: 0 % (ref 0.0–0.2)

## 2023-12-07 MED ORDER — POLYVINYL ALCOHOL 1.4 % OP SOLN
1.0000 [drp] | OPHTHALMIC | Status: DC | PRN
  Administered 2023-12-08: 1 [drp] via OPHTHALMIC
  Filled 2023-12-07: qty 15

## 2023-12-07 MED ORDER — HEPARIN SODIUM (PORCINE) 1000 UNIT/ML DIALYSIS: 1000.0000 [IU] | INTRAMUSCULAR | Status: DC | PRN

## 2023-12-07 MED ORDER — LIDOCAINE HCL (PF) 1 % IJ SOLN: 5.0000 mL | INTRAMUSCULAR | Status: DC | PRN

## 2023-12-07 MED ORDER — PENTAFLUOROPROP-TETRAFLUOROETH EX AERO: 1.0000 | INHALATION_SPRAY | CUTANEOUS | Status: DC | PRN

## 2023-12-07 MED ORDER — ALTEPLASE 2 MG IJ SOLR: 2.0000 mg | Freq: Once | INTRAMUSCULAR | Status: DC | PRN

## 2023-12-07 MED ORDER — ANTICOAGULANT SODIUM CITRATE 4% (200MG/5ML) IV SOLN: 5.0000 mL | Status: DC | PRN

## 2023-12-07 MED ORDER — FUROSEMIDE 40 MG PO TABS: 40.0000 mg | ORAL_TABLET | Freq: Every day | ORAL | Status: DC | PRN

## 2023-12-07 MED ORDER — LIDOCAINE-PRILOCAINE 2.5-2.5 % EX CREA: 1.0000 | TOPICAL_CREAM | CUTANEOUS | Status: DC | PRN

## 2023-12-07 NOTE — Progress Notes (Signed)
   [facility] Progress Note   Subjective:   Seen during PT session - doing well. No CP/dyspnea. For HD later today.  Objective Vitals:   12/06/23 1400 12/06/23 1952 12/06/23 2150 12/07/23 0450  BP: (!) 146/51 137/63  (!) 128/43  Pulse: 60 63 65 (!) 59  Resp: 17 18 19 18   Temp: 98.3 F (36.8 C) 97.9 F (36.6 C)  98.5 F (36.9 C)  TempSrc: Oral     SpO2: 95% 99% 98% 96%  Weight:    113.8 kg  Height:       Physical Exam General: Well appearing, NAD. Room air.  Heart: RRR; no murmur Lungs: CTA anteriorly Abdomen: soft Extremities: No LE edema Dialysis Access: LUE AVF + t/b  Additional Objective Labs: Basic Metabolic Panel: Recent Labs  Lab 11/30/23 1044 12/02/23 1521 12/05/23 2037 12/05/23 2250  NA 138  --  141 141  K 4.0  --  4.7 4.0  CL 104  --  104 105  CO2 21*  --  26 23  GLUCOSE 157*  --  182* 117*  BUN 62*  --  73* 72*  CREATININE 4.63* 2.92* 5.26* 5.13*  CALCIUM 9.3  --  9.9 9.7  PHOS 5.9*  --  4.9* 4.6   Liver Function Tests: Recent Labs  Lab 11/30/23 1044 12/05/23 2037 12/05/23 2250  ALBUMIN 3.6 3.5 3.6   CBC: Recent Labs  Lab 11/30/23 1044 12/02/23 1521 12/05/23 2037 12/05/23 2250  WBC 11.2* 9.6 10.7* 11.0*  NEUTROABS  --   --  7.0 7.1  HGB 11.6* 12.4 11.3* 10.9*  HCT 34.0* 36.8 34.1* 33.9*  MCV 89.2 90.6 92.9 92.6  PLT 247 244 241 245   CBG: Recent Labs  Lab 12/06/23 0657 12/06/23 1135 12/06/23 1646 12/06/23 2048 12/07/23 0556  GLUCAP 185* 168* 160* 135* 142*   Medications:   (feeding supplement) PROSource Plus  30 mL Oral BID BM   allopurinol  300 mg Oral Daily   amLODipine  5 mg Oral QHS   aspirin  81 mg Oral Daily   atorvastatin  80 mg Oral Daily   Chlorhexidine Gluconate Cloth  6 each Topical Q0600   clopidogrel  75 mg Oral Daily   furosemide  40 mg Oral Q T,Th,S,Su   heparin  5,000 Units Subcutaneous Q8H   hydrALAZINE  50 mg Oral BID   insulin aspart  0-9 Units Subcutaneous TID WC   insulin aspart   20 Units Subcutaneous TID WC   insulin glargine-yfgn  63 Units Subcutaneous QHS   losartan  100 mg Oral Daily   metoprolol tartrate  50 mg Oral BID   multivitamin  1 tablet Oral QHS   scopolamine  1 patch Transdermal Q72H   sevelamer carbonate  800 mg Oral TID WC    Dialysis Orders: MWF - [facility] 3:45hr, 400/800, EDW 115kg, 2K/2.5Ca bath, LUE AVF, 15g - unclear VDRA, ESA, heparin doses.   Assessment/Plan: Debility: In CIR, therapy going well. S/p L pons CVA. Ongoing double vision. On Plavix/ASA/statin. ESRD: Usual MWF schedule - for HD later today. HTN/volume: BP stable, no edema. Still urinates. No UF planned today. Anemia of ESRD: Hgb 10.9 - no ESA for now. Secondary HPTH: Ca/Phos ok - continue sevelamer as binder. Nutrition: On protein supp T2DM: Insulin, per primary. CAD, Hx CABG  Ozzie Hoyle, PA-C 12/07/2023, 10:14 AM  [employer]

## 2023-12-07 NOTE — Progress Notes (Signed)
Notififed Glennie Hawk, PA of patient's UA results.   Tilden Dome, LPN

## 2023-12-07 NOTE — Patient Care Conference (Signed)
Inpatient RehabilitationTeam Conference and Plan of Care Update Date: 12/07/2023   Time: 10:34 AM    Patient Name: Sharon Arias      Medical Record Number: [id number]  Date of Birth: Mar 11, 1961 Sex: Female         Room/Bed: 4W03C/4W03C-01 Payor Info: Payor: [employer] MEDICARE / Plan: [facility] MEDICARE / Product Type: *No Product type* /    Admit Date/Time:  12/02/2023  2:29 PM  Primary Diagnosis:  Ischemic cerebrovascular accident (CVA) [facility])  Hospital Problems: Principal Problem:   Ischemic cerebrovascular accident (CVA) [facility])    Expected Discharge Date: Expected Discharge Date: 12/17/23  Team Members Present: Physician leading conference: Dr. Claudette Laws Social Worker Present: Lavera Guise, BSW Nurse Present: Chana Bode, RN PT Present: Casimiro Needle, PT OT Present: Mariann Barter, OT SLP Present: Everardo Pacific, SLP PPS Coordinator present : Fae Pippin, SLP     Current Status/Progress Goal Weekly Team Focus  Bowel/Bladder   continent of bowel/bladder   will remain continent of bowel/bladder   will assess qshift and PRN    Swallow/Nutrition/ Hydration               ADL's   U/LB bathing close superivsion from seated position on TTB, UB dressing supervision, LB dressing min A, toileting CGA-min A depending on how dizzy she is and energy levels, CGA to min A for functional tranfers using rollator   mod I goals   BADL retraining, Pt education on energy conservation, fall prevention, activity modifications, and oculormotor exercises; Pt is able to implement EC dn fall prevention techniques with min cues, very good safety awareness; barriers- poor sleep 2/2 dyalisis schedule, activity tolerance, dizziness    Mobility   CGA/min A sit<>stand & stand pivot using RW vs rollator, min A gait up to 126ft using L HHA for balance challenge, CGA/light min A 12 stairs using HRs - missed majority of therapy sessions on 12/17 due to late night in dialysis -  having significant diplopia and vertigo symptoms with associated nausea - planning for vestibular eval on 12/18 with pt reporting a hx of BPPV   mod-I based on family support  activity tolerance, dynamic standing balance, dynamic gait training using LRAD, pt education, oculomotor exercises, DME training    Communication   discharged from SLP- goals met            Safety/Cognition/ Behavioral Observations               Pain   denies pain   will continue to deny pain   will assess qshift and PRN    Skin   has redness under left side of abdominal fold other wise intact   skin will remain intact  will assess qshift and PRN      Discharge Planning:  Discharging home with daughter to assist during the day and son to assist at night. 1 level home, 2 steps. Patient has: cane, RW, rollator, BSC. shower seat and electric scooter.   Team Discussion: Patient post CVA with dysuria; dizziness and balance issues.  Step off and worse with turns.  Patient on target to meet rehab goals: yes, currently needs close supervision for bathing seated and supervision for dressing upper body but min assist for lower body and CGA for toileting. Needs CGA - min for transfers. Goals for discharge set for mod I overall.  *See Care Plan and progress notes for long and short-term goals.   Revisions to Treatment Plan:  Vestibular evaluation Scopolamine patch  Ocular motor exercises Berg test (18/56)  Nephrology consulted for Lasix   Teaching Needs: Safety, medications, transfers, toileting, etc.   Current Barriers to Discharge: Decreased caregiver support, Home enviroment access/layout, and Hemodialysis  Possible Resolutions to Barriers: Family education HH follow up services DME: TTB     Medical Summary Current Status: dizziness improved, having dysuria , vision improving     Possible Resolutions to [employer] Focus: check UA, cont scopolamine   Continued Need for Acute  Rehabilitation Level of Care: The patient requires daily medical management by a physician with specialized training in physical medicine and rehabilitation for the following reasons: Direction of a multidisciplinary physical rehabilitation program to maximize functional independence : Yes Medical management of patient stability for increased activity during participation in an intensive rehabilitation regime.: Yes Analysis of laboratory values and/or radiology reports with any subsequent need for medication adjustment and/or medical intervention. : Yes   I attest that I was present, lead the team conference, and concur with the assessment and plan of the team.   Chana Bode B 12/07/2023, 1:36 PM

## 2023-12-07 NOTE — Progress Notes (Signed)
Occupational Therapy Session Note  Patient Details  Name: Sharon Arias MRN: [id number] Date of Birth: 10-Sep-1961  Today's Date: 12/07/2023 OT Individual Time: 4098-1191 OT Individual Time Calculation (min): 55 min    Short Term Goals: Week 1:  OT Short Term Goal 1 (Week 1): Patient to perform LE dresing with set up and vc's using AE as needed OT Short Term Goal 2 (Week 1): Patient to tolerate standing grooming at sink x 3 minutes SBA for safety OT Short Term Goal 3 (Week 1): Patien to perform toileting with SPV using DME as needed  Skilled Therapeutic Interventions/Progress Updates:     Pt received sitting up in bed presenting to be in good spirits receptive to skilled OT session reporting 0/10 pain- OT offering intermittent rest breaks, repositioning, and therapeutic support to optimize participation in therapy session. Pt reporting "the doctor gave me a patch for my dizziness, but I feel even more dizzy this AM". Pt requesting to take shower this AM. Focus this session BADL retraining with emphasis on increasing Pt's safety and independence in preparation for d/c home and to decrease overall bourdon of care. Set-up bed room and bathroom to simulate Pt's home set-up. Pt able to navigate within the therapy spaces to gather her clothing from her dresser and towels from towel rack with CGA-min A using rollator for balance. Pt with mild LOB during functional mobility 2/2 to dizziness and diplopia, however able to correct with min A only. Pt transferred to TTB positioned in walk-in shower with CGA and doffed clothing in seated position completing lateral leans to doff pants from waist. Pt reporting she sits for her entire shower at home and set-ups the environment so that she can reach all her needed toiletry items and towels- Pt demonstrating good understanding of BR safety, fall prevention techniques, and energy conservation. Once seated in shower, Pt able to complete U/LB bathing with close  supervision using long handled sponge to wash lower B LEs and feet. Pt dried self with supervision and completed U/LB dressing seated on TTB. Pt able to donn shirt and pants seated with supervision crossing B LEs into figure four position to weave feet. When standing to bring pants to waist, Pt required min A to maintain dynamic standing balance using rollator +increased time to accommodate for dizziness symptoms. Pt also closing eyes in standing to accommodate for dizziness symptoms with mod verbal cues required to initiate opening single eye for safety. Pt completed functional mobility to recliner using rollator with CGA. Pt reporting she does not feel safety picking items up from the ground or bending over, education provided on using reacher to increase safety with Pt receptive to education and motivated to practice using reacher in upcoming therapy sessions. Pt was left resting in recliner with call bell in reach, chair alarm on, and all needs met.    Therapy Documentation Precautions:  Precautions Precautions: Fall, Posterior Hip Precaution Comments: low vision, vertical diplopia, spinning sensation with mobility and at rest Restrictions Weight Bearing Restrictions Per Provider Order: No  Therapy/Group: Individual Therapy  Clide Deutscher 12/07/2023, 7:50 AM

## 2023-12-07 NOTE — Progress Notes (Signed)
PROGRESS NOTE   Subjective/Complaints:  Patient evaluated bedside this morning.  Patient reports that she has less nausea.  She has less dizziness, but is still dizzy.  She thinks the patch helped a little bit.  She reports having a bowel movement yesterday.  She does report having some dysuria and some urinary frequency.  She denies any hematuria or foul odor to her urine.  She reports that she thinks she might have a UTI.  ROS:   Patient denies any leg swelling, shortness of breath, or chest pain.   Negative except for HPI-   Objective:   No results found. Recent Labs    12/05/23 2037 12/05/23 2250  WBC 10.7* 11.0*  HGB 11.3* 10.9*  HCT 34.1* 33.9*  PLT 241 245   Recent Labs    12/05/23 2037 12/05/23 2250  NA 141 141  K 4.7 4.0  CL 104 105  CO2 26 23  GLUCOSE 182* 117*  BUN 73* 72*  CREATININE 5.26* 5.13*  CALCIUM 9.9 9.7    Intake/Output Summary (Last 24 hours) at 12/07/2023 0705 Last data filed at 12/06/2023 1811 Gross per 24 hour  Intake 600 ml  Output --  Net 600 ml        Physical Exam: Vital Signs Blood pressure (!) 128/43, pulse (!) 59, temperature 98.5 F (36.9 C), resp. rate 18, height 5\' 7"  (1.702 m), weight 113.8 kg, SpO2 96%.   General: Patient is resting comfortably in bed in no acute distress  Eyes: PEARLA and EOMI, no nystagmus Cardio: Regular rate and rhythm, no murmurs, rubs or gallops.  No lower extremity edema appreciated Pulmonary: Clear to ausculation bilaterally with no rales, rhonchi, and crackles  MSK: 5/5 strength to upper and lower extremities.  Neuro: Sensation intact, EOMI, PERRLA, some left upper extremity ataxia still present      Assessment/Plan: 1. Functional deficits which require 3+ hours per day of interdisciplinary therapy in a comprehensive inpatient rehab setting. Physiatrist is providing close team supervision and 24 hour management of active medical  problems listed below. Physiatrist and rehab team continue to assess barriers to discharge/monitor patient progress toward functional and medical goals  Care Tool:  Bathing    Body parts bathed by patient: Right arm, Left upper leg, Right lower leg, Left arm, Chest, Left lower leg, Abdomen, Face, Front perineal area, Buttocks, Right upper leg         Bathing assist Assist Level: Contact Guard/Touching assist     Upper Body Dressing/Undressing Upper body dressing   What is the patient wearing?: Pull over shirt    Upper body assist Assist Level: Set up assist    Lower Body Dressing/Undressing Lower body dressing      What is the patient wearing?: Underwear/pull up, Pants     Lower body assist Assist for lower body dressing: Moderate Assistance - Patient 50 - 74%     Toileting Toileting    Toileting assist Assist for toileting: Contact Guard/Touching assist     Transfers Chair/bed transfer  Transfers assist     Chair/bed transfer assist level: Minimal Assistance - Patient > 75%     Locomotion Ambulation   Ambulation assist  Assist level: Minimal Assistance - Patient > 75% Assistive device: Hand held assist Max distance: 121ft   Walk 10 feet activity   Assist     Assist level: Minimal Assistance - Patient > 75% Assistive device: Hand held assist   Walk 50 feet activity   Assist    Assist level: Minimal Assistance - Patient > 75% Assistive device: Hand held assist    Walk 150 feet activity   Assist Walk 150 feet activity did not occur: Safety/medical concerns         Walk 10 feet on uneven surface  activity   Assist     Assist level: Minimal Assistance - Patient > 75% Assistive device: Hand held assist, Other (comment) (railing)   Wheelchair     Assist Is the patient using a wheelchair?: Yes (to/from gym for energy conservation - uses an electric scooter for longer distances baseline) Type of Wheelchair: Manual     Wheelchair assist level: Dependent - Patient 0%      Wheelchair 50 feet with 2 turns activity    Assist        Assist Level: Dependent - Patient 0%   Wheelchair 150 feet activity     Assist      Assist Level: Dependent - Patient 0%   Blood pressure (!) 128/43, pulse (!) 59, temperature 98.5 F (36.9 C), resp. rate 18, height 5\' 7"  (1.702 m), weight 113.8 kg, SpO2 96%.  Medical Problem List and Plan: 1. Functional deficits secondary to ischemic infarct left brachium pontis secondary to small vessel disease             -patient may  shower             -ELOS/Goals: 10-14 days, supervision PT, supervision OT, modified independent SLP, team meeting today              - Stable for admission to IRF   Con't CIR PT and OT  2.  Antithrombotics: -DVT/anticoagulation:  Pharmaceutical: Heparin             -antiplatelet therapy: Aspirin 81 mg daily and Plavix 75 mg daily as prior to admission 3. Pain Management: Tylenol as needed  12/14- denies pain- con't tlyenol prn 4. Mood/Behavior/Sleep: Provide emotional support             -antipsychotic agents: N/A 5. Neuropsych/cognition: This patient is capable of making decisions on her own behalf. 6. Skin/Wound Care: Routine skin checks 7. Fluids/Electrolytes/Nutrition: Routine in and outs with follow-up chemistries 8.  End-stage renal disease.  Continue hemodialysis as directed-patient reports dialysis overnight yesterday.  Did not tolerate well.  500 mL removed.  Interesting enough, patient reports having robust urine output.  Refused Lasix this morning.  Will continue with dialysis per nephro. 9.  CAD with CABG 2013.  No chest pain or shortness of breath.  Continue aspirin and Plavix 10.  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  Hemoglobin A1c 8.1. Novolog 20 units 3 times daily with meals. Semglee 63 units nightly.  Blood glucose levels are measuring well.  Will continue with current insulin regimen.  Check blood  sugars before meals and at bedtime.  Blood glucose levels measuring well.  CBG (last 3)  Recent Labs    12/06/23 1646 12/06/23 2048 12/07/23 0556  GLUCAP 160* 135* 142*  Fair control   11.  Hypertension.  Cozaar 100 mg daily, hydralazine 50 mg twice daily, Norvasc 5 mg daily, Lopressor 50 mg twice daily.  Scopolamine  patch administered yesterday, blood pressure did drop slightly, but still within normal limits.  Will continue with current regimen.   Vitals:   12/06/23 2150 12/07/23 0450  BP:  (!) 128/43  Pulse: 65 (!) 59  Resp: 19 18  Temp:  98.5 F (36.9 C)  SpO2: 98% 96%    12.  Hyperlipidemia.  Lipitor 13.  Diastolic congestive heart failure.  Lasix 40 mg Tuesday Thursday Saturday Sunday.  No signs of volume overload today.  No concern for exacerbation of heart failure. 14.  History of gout.  Zyloprim 300 mg daily.  Monitor for any gout flares, no concern for gout flare at this time. 15.Obesity.  BMI 39.50.  Dietary follow-up 16.  Anemia of chronic disease.  No acute concerns at this time.  Hemoglobin slightly decreased, but no concerns for bleeding.  17. Constipation, resolved as patient bowel movement yesterday.  18.  Diabetic retinopathy f/u optho   19.  Dizziness: Likely secondary to stroke.  Vestibular rehab today.  Will plan to have scopolamine patch on for the next 48 hours for total of 72 hours.  Seems to be helping.  Nausea has improved.  Dizziness has slightly improved.  20.  Dysuria: Patient endorses having dysuria today.  Also has urinary frequency.  Could be related to a urinary infection.  Will obtain UA with reflex to culture.  LOS: 5 days A FACE TO FACE EVALUATION WAS PERFORMED  Modena Slater 12/07/2023, 7:05 AM  Internal medicine Resident PGY-2

## 2023-12-07 NOTE — Plan of Care (Signed)
  Problem: RH BOWEL ELIMINATION Goal: RH STG MANAGE BOWEL WITH ASSISTANCE Description: STG Manage Bowel with toileting Assistance. Outcome: Progressing Goal: RH STG MANAGE BOWEL W/MEDICATION W/ASSISTANCE Description: STG Manage Bowel with Medication with mod I Assistance. Outcome: Progressing   Problem: RH SAFETY Goal: RH STG ADHERE TO SAFETY PRECAUTIONS W/ASSISTANCE/DEVICE Description: STG Adhere to Safety Precautions With cues Assistance/Device. Outcome: Progressing   Problem: RH KNOWLEDGE DEFICIT Goal: RH STG INCREASE KNOWLEDGE OF DIABETES Description: Patient and family will be able to manage DM using educational resources for medications and dietary modifications independently Outcome: Progressing

## 2023-12-07 NOTE — Progress Notes (Signed)
Patient has not return to the unit from dialysis at this time, report given to oncoming night shift on CIR.    Tilden Dome, LPN

## 2023-12-07 NOTE — Progress Notes (Signed)
Received patient in bed to unit.  Alert and oriented.  Informed consent signed and in chart.   TX duration:3.5  Patient tolerated well.  Transported back to the room  Alert, without acute distress.  Hand-off given to patient's nurse.   Access used: Left Upper arm fistula Access issues: Patient's Venous was a hard stick.  Lowered BFR to 350 due to arterial pressures.  Total UF removed: 0mL Medication(s) given: Tylenol   12/07/23 1839  Vitals  Temp 97.9 F (36.6 C)  Temp Source Oral  BP (!) 155/53  MAP (mmHg) 80  BP Location Right Arm  BP Method Automatic  Patient Position (if appropriate) Lying  Pulse Rate 67  Pulse Rate Source Monitor  ECG Heart Rate 66  Resp 17  Oxygen Therapy  SpO2 98 %  O2 Device Room Air  During Treatment Monitoring  Duration of HD Treatment -hour(s) 3.5 hour(s)  Cumulative Fluid Removed (mL) per Treatment  0  HD Safety Checks Performed Yes  Intra-Hemodialysis Comments Tx completed;Tolerated well  Dialysis Fluid Bolus Normal Saline  Bolus Amount (mL) 300 mL     Stacie Glaze LPN [facility]

## 2023-12-07 NOTE — Progress Notes (Signed)
Physical Therapy Session Note  Patient Details  Name: Sharon Arias MRN: [id number] Date of Birth: Dec 07, 1961  Today's Date: 12/07/2023 PT Individual Time: 0920-1029 and 1107-1202 PT Individual Time Calculation (min): 69 min and 55 min  Short Term Goals: Week 1:  PT Short Term Goal 1 (Week 1): Pt will perform suipne<>sit mod-I PT Short Term Goal 2 (Week 1): Pt will perform sit<>stands using LRAD with supervision PT Short Term Goal 3 (Week 1): Pt will perform bed<>chair transfers using LRAD with supervision PT Short Term Goal 4 (Week 1): Pt will ambulate at least 155ft using LRAD with supervision  Skilled Therapeutic Interventions/Progress Updates:    Session 1: Pt received sitting in recliner awake and agreeable to therapy session reporting she is feeling much better today compared to yesterday. Pt reports she is not having any nausea this morning but is having a "weird dizziness" where she feels like it is taking her longer to get her balance. Pt requesting to perform vestibular evaluation in next session.  Sit<>stands using rollator with CGA for steadying. Gait training ~88ft in room using rollator to w/c with CGA for steadying.  Transported to/from gym in w/c for time management and energy conservation.  Gait training ~235ft using rollator with CGA/light min assist for balance and pt having a few episodes of staggering imbalance, but able to utilize AD support to maintain balance without increased assist - more instability noted with turning.  Gait training ~185ft x2 + ~10ft (seated breaks between) using L HHA with min/mod assist for balance/steadying. Pt demonstrating the following gait deviations with therapist providing the described cuing and facilitation for improvement:  - has increased R/L lateral postural sway and path deviation  - increased LOB when turning, especially when turning towards L  - slightly wider BOS throughout, lack of trunk rotation due to guarded posture  Pt  reports she did her oculomotor exercises again yesterday and that they were very challenging still. Pt reports she often wont turn her head to look at things to avoid onset of dizziness symptoms - educated on importance of continuing to move her head and perform seated supported head rotations.  Standing dynamic balance tasks including:  - standing with wide BOS,  horizontal head rotations x10 reps - CGA for steadying -  standing with wide BOS, trunk rotations with ipsilateral head rotations x10reps - CGA/min A for balance and pt having increased instability with head rotations towards the L  - standing with more normal BOS while performing horizontal head rotations x10 reps - CGA to min A for balance with L anterior LOB   Pt reports seated vertical head nods make her immediately feel dizzy with a feeling of being pushed down upon bringing her head back down from being tilted up - tried x8 reps with pt reporting no improvement in her symptoms.  Pt reporting increase in her dizziness symptoms.  Gait training ~124ft using L HHA as described above and performed stair navigation training ascending/descending 4 steps (6" height) using B HRs with light min assist for balance - self-selects varying between reciprocal and step-to pattern in both directions with good strength in LEs.  Pt reports fatigue and need to use bathroom. Transported back to room.   Gait training ~42ft into bathroom using rollator with CGA for steadying. Standing with CGA managed LB clothing without assist. Requires increased time but is continent of baldder and performed seated peri-care independently.   At end of session, pt left seated EOB with needs in reach  and bed alarm on.   Session 2: Pt received supine in bed with laboratory present for blood drawl. Pt agreeable to therapy session with plan for vestibular assessment. During lab draw, therapist explained the plan of the vestibular assessment and what will be tested and what  we will be looking for.  Oculomotor exam: - eye alignment: R eye slightly off from L eye, head tilt towards L - Smooth pursuits: smooth and WNL at this time; however, eyes not perfectly aligned together - saccades: WNL horizontal and vertical, no more obvious "swooping" noticed in R eye  Vestibular testing: - VOR cancellation: WNL - Head Impulse Test (HIT): + for corrective saccade during L head rotation, normal on R side  - Loaded R Dix Hallpike: positive with delayed onset nystagmus lasting <10seconds treated with canalith repositioning maneuver, on repeat testing symptoms and nystagmus were gone, but when returned to sitting while maintaining R head rotation, pt with L torsional nystagmus - Loaded L Dix Hallpike: negative - Horizontal Roll Test:  --- R Roll test: has upward torsional rotation towards L --- L roll test: negative  Utilized bedrails for support during testing.   Pt education on ensuring adequate water intake within her dialysis parameters. Pt reports taking vitamin D supplements at baseline.   At end of session, pt left seated EOB with needs in reach and bed alarm no.  Therapy Documentation Precautions:  Precautions Precautions: Fall, Posterior Hip Precaution Comments: low vision, vertical diplopia, spinning sensation with mobility and at rest Restrictions Weight Bearing Restrictions Per Provider Order: No   Pain:  Session 1: Reports min onset of low back pain during standing trunk rotation activities - provided seated rest break and modified interventions for pain management.  Session 2: No reports of pain throughout session.    Therapy/Group: Individual Therapy  Ginny Forth , PT, DPT, NCS, CSRS 12/07/2023, 7:52 AM

## 2023-12-07 NOTE — Progress Notes (Signed)
Patient ID: Sharon Arias, female   DOB: March 22, 1961, 62 y.o.   MRN: [id number] Met with the patient to review current medical status, rehab schedule, team conference and plan of care. Discussed secondary risks including HTN, HLD (LDL 66/Trig 167) on lipitor, DM (A1 C 8.1), ESRD on HD and OSA, CAD with HF. Reports if she could just get control of the dizziness, she would be fine; able to deal with left facial droop and weakness. Has a handicapped apt set up on back of her home with ramped entry.  Continue to follow along to address educational needs to facilitate preparation for discharge. [name]

## 2023-12-07 NOTE — Progress Notes (Signed)
SW met with patient and provided team conference updates. Patient doing well and will require a TTB at discharge. No additional questions or concerns.

## 2023-12-08 LAB — GLUCOSE, CAPILLARY
Glucose-Capillary: 148 mg/dL — ABNORMAL HIGH (ref 70–99)
Glucose-Capillary: 151 mg/dL — ABNORMAL HIGH (ref 70–99)
Glucose-Capillary: 189 mg/dL — ABNORMAL HIGH (ref 70–99)
Glucose-Capillary: 155 mg/dL — ABNORMAL HIGH (ref 70–99)

## 2023-12-08 MED ORDER — CEPHALEXIN 250 MG PO CAPS
250.0000 mg | ORAL_CAPSULE | Freq: Every day | ORAL | Status: AC
  Administered 2023-12-08 – 2023-12-12 (×5): 250 mg via ORAL
  Filled 2023-12-08 (×5): qty 1

## 2023-12-08 MED ORDER — CEPHALEXIN 250 MG PO CAPS: 500.0000 mg | ORAL_CAPSULE | Freq: Two times a day (BID) | ORAL | Status: DC

## 2023-12-08 NOTE — Progress Notes (Signed)
Patient ID: Sharon Arias, female   DOB: [birth year], 62 y.o.   MRN: [id number]  [facility] referral submitted to [facility].

## 2023-12-08 NOTE — Progress Notes (Signed)
Patient ID: Sharon Arias, female   DOB: Feb 19, 1961, 62 y.o.   MRN: [id number]  TTB ordered through [employer].

## 2023-12-08 NOTE — Progress Notes (Signed)
Occupational Therapy Session Note  Patient Details  Name: Sharon Arias MRN: [id number] Date of Birth: 04/28/1961  Today's Date: 12/08/2023 OT Individual Time:  1103-1200    OT Individual Time Calculation: 57 min   Short Term Goals: Week 1:  OT Short Term Goal 1 (Week 1): Patient to perform LE dresing with set up and vc's using AE as needed OT Short Term Goal 2 (Week 1): Patient to tolerate standing grooming at sink x 3 minutes SBA for safety OT Short Term Goal 3 (Week 1): Patien to perform toileting with SPV using DME as needed  Skilled Therapeutic Interventions/Progress Updates:     Pt received semi-reclined in bed resting. Pt presenting to be in good spirits receptive to skilled OT session reporting 0/10 pain- OT offering intermittent rest breaks, repositioning, and therapeutic support to optimize participation in therapy session. Pt continuing to present with dizziness and fatigued d/t not sleeping well last night. Pt dressed and ready for the day upon OT arrival with all BADL need met politely declining need for BR. Focus this session FMC/VMC, activity tolerance, and DA education.   Pt completed short distance functional mobility from EOB > wc using rollator with min A provided for balance 2/2 dizziness. Transported Pt total A to therapy gym in wc for time management and energy conservation.   Engaged Pt in FM/VMC activity with visual scanning, visual fixation, and targeted reach incorporated into activity. Pt instructed to maintain both eyes open and fixated on large peg while OT slowly moved peg from R>L or L<R to facilitate visual scanning. Pt then utilized L UE to retrieve peg and place into peg board. Pt continuing to present with diplopia during activity, however Pt reported it is decreased from previous session and that she is able to accommodate by closing one eye or allowing increased time to focus eyes to learn which object is the target. Pt able to retrieve and place items  without dysmetria or dropping, however Pt stating task requires maximal effort both physically and mentally.   Educated Pt on purpose of reacher to increase safety at home and increase independence when retrieving items, picking up items from floor, and when performing ADL or IADL tasks. Pt reported she is very scared to lean over to pick up items d/t her fall risk and she was very receptive to education provided on reacher and using one upon d/c. Engaged Pt in functional activity utilizing reacher to retrieve small household items from the floor, elevated surfaces, and tabletop with Pt able to complete task with mod verbal cues for use and problem solving reacher and min dropping noted. Pt requesting OT to assist her in locating options to purchase preachers with education provided.   Transported Pt back to her room total A in w/c d/t dizziness symptoms. Pt completed functional mobility from wc > recliner using rollator with CGA to light min A provided for balance.Pt was left resting in recliner with call bell in reach, chair alarm on, and all needs met.    Therapy Documentation Precautions:  Precautions Precautions: Fall, Posterior Hip Precaution Comments: low vision, vertical diplopia, spinning sensation with mobility and at rest Restrictions Weight Bearing Restrictions Per Provider Order: No   Therapy/Group: Individual Therapy  Clide Deutscher 12/08/2023, 11:26 AM

## 2023-12-08 NOTE — Progress Notes (Signed)
PROGRESS NOTE   Subjective/Complaints:  Patient evaluated bedside this morning.  Reports she still having some dizziness, but last night she slept well and only had 1 episode of dizziness last night.  She states she is still having some dysuria.  She reports dialysis went better.  She denies any shortness of breath, chest pain, cough.  She does report having some nausea.  ROS:   Patient denies any leg swelling, shortness of breath, chest pain, but does endorse some nausea and dizziness.   Negative except for HPI-   Objective:   No results found. Recent Labs    12/05/23 2250 12/07/23 1111  WBC 11.0* 12.4*  HGB 10.9* 11.2*  HCT 33.9* 33.3*  PLT 245 233   Recent Labs    12/05/23 2250 12/07/23 1111  NA 141 141  K 4.0 4.6  CL 105 102  CO2 23 26  GLUCOSE 117* 174*  BUN 72* 65*  CREATININE 5.13* 5.05*  CALCIUM 9.7 9.9    Intake/Output Summary (Last 24 hours) at 12/08/2023 0708 Last data filed at 12/07/2023 1839 Gross per 24 hour  Intake 595 ml  Output 0 ml  Net 595 ml        Physical Exam: Vital Signs Blood pressure (!) 130/55, pulse (!) 59, temperature 97.9 F (36.6 C), resp. rate 18, height 5\' 7"  (1.702 m), weight 111.9 kg, SpO2 100%.   General: Patient is resting comfortably in bed in no acute distress  Eyes: PEARLA and EOMI Cardio: Regular rate and rhythm, no murmurs, rubs or gallops.  No lower extremity edema appreciated Pulmonary: Clear to ausculation bilaterally with no rales, rhonchi, and crackles  MSK: 5/5 strength to upper and lower extremities.  Neuro: Finger-to-nose intact, ataxia is definitely decreased, 5/5 strength noted to upper and lower extremities bilaterally, sensation intact      Assessment/Plan: 1. Functional deficits which require 3+ hours per day of interdisciplinary therapy in a comprehensive inpatient rehab setting. Physiatrist is providing close team supervision and 24 hour  management of active medical problems listed below. Physiatrist and rehab team continue to assess barriers to discharge/monitor patient progress toward functional and medical goals  Care Tool:  Bathing    Body parts bathed by patient: Right arm, Left upper leg, Right lower leg, Left arm, Chest, Left lower leg, Abdomen, Face, Front perineal area, Buttocks, Right upper leg         Bathing assist Assist Level: Contact Guard/Touching assist     Upper Body Dressing/Undressing Upper body dressing   What is the patient wearing?: Pull over shirt    Upper body assist Assist Level: Set up assist    Lower Body Dressing/Undressing Lower body dressing      What is the patient wearing?: Underwear/pull up, Pants     Lower body assist Assist for lower body dressing: Moderate Assistance - Patient 50 - 74%     Toileting Toileting    Toileting assist Assist for toileting: Contact Guard/Touching assist     Transfers Chair/bed transfer  Transfers assist     Chair/bed transfer assist level: Minimal Assistance - Patient > 75%     Locomotion Ambulation   Ambulation assist  Assist level: Minimal Assistance - Patient > 75% Assistive device: Hand held assist Max distance: 132ft   Walk 10 feet activity   Assist     Assist level: Minimal Assistance - Patient > 75% Assistive device: Hand held assist   Walk 50 feet activity   Assist    Assist level: Minimal Assistance - Patient > 75% Assistive device: Hand held assist    Walk 150 feet activity   Assist Walk 150 feet activity did not occur: Safety/medical concerns         Walk 10 feet on uneven surface  activity   Assist     Assist level: Minimal Assistance - Patient > 75% Assistive device: Hand held assist, Other (comment) (railing)   Wheelchair     Assist Is the patient using a wheelchair?: Yes (to/from gym for energy conservation - uses an electric scooter for longer distances baseline) Type  of Wheelchair: Manual    Wheelchair assist level: Dependent - Patient 0%      Wheelchair 50 feet with 2 turns activity    Assist        Assist Level: Dependent - Patient 0%   Wheelchair 150 feet activity     Assist      Assist Level: Dependent - Patient 0%   Blood pressure (!) 130/55, pulse (!) 59, temperature 97.9 F (36.6 C), resp. rate 18, height 5\' 7"  (1.702 m), weight 111.9 kg, SpO2 100%.  Medical Problem List and Plan: 1. Functional deficits secondary to ischemic infarct left brachium pontis secondary to small vessel disease             -patient may  shower             -ELOS/Goals: 10-14 days, supervision PT, supervision OT, modified independent SLP              - Stable for admission to IRF   Con't CIR PT and OT  2.  Antithrombotics: -DVT/anticoagulation:  Pharmaceutical: Heparin             -antiplatelet therapy: Aspirin 81 mg daily and Plavix 75 mg daily as prior to admission 3. Pain Management: Tylenol as needed  12/14- denies pain- con't tlyenol prn 4. Mood/Behavior/Sleep: Provide emotional support             -antipsychotic agents: N/A 5. Neuropsych/cognition: This patient is capable of making decisions on her own behalf. 6. Skin/Wound Care: Routine skin checks 7. Fluids/Electrolytes/Nutrition: Routine in and outs with follow-up chemistries 8.  End-stage renal disease.  Continue hemodialysis as directed, patient did have dialysis yesterday, with no fluid removal, patient is well under her dry weight, patient tolerated well, next dialysis will be tomorrow. 9.  CAD with CABG 2013.  No chest pain or shortness of breath.  Continue aspirin and Plavix 10.  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  Hemoglobin A1c 8.1. Novolog 20 units 3 times daily with meals. Semglee 63 units nightly.  Blood glucose levels continue to measure well.  CBG (last 3)  Recent Labs    12/07/23 1214 12/07/23 1958 12/08/23 0621  GLUCAP 175* 160* 148*  Fair  control   11.  Hypertension.  Cozaar 100 mg daily, hydralazine 50 mg twice daily, Norvasc 5 mg daily, Lopressor 50 mg twice daily.  Scopolamine patch administered and has been helping, blood pressure measuring well.   Vitals:   12/07/23 2001 12/08/23 0540  BP: 123/64 (!) 130/55  Pulse: 66 (!) 59  Resp: 16 18  Temp: 98.2 F (36.8 C) 97.9 F (36.6 C)  SpO2: 98% 100%    12.  Hyperlipidemia.  Lipitor 13.  Diastolic congestive heart failure.  Lasix 40 mg Tuesday Thursday Saturday Sunday.  No signs of volume overload. 14.  History of gout.  Zyloprim 300 mg daily.  Monitor for any gout flares, no concern for gout flare at this time. 15.Obesity.  BMI 39.50.  Dietary follow-up 16.  Anemia of chronic disease.  No acute concerns at this time.  Hemoglobin slightly decreased, but no concerns for bleeding.  17. Constipation, no bowel movement yesterday, but daily.  Patient bowel movement.  18.  Diabetic retinopathy f/u optho   19.  Dizziness: Likely secondary to stroke.  Vestibular rehab with today.  Dizziness has slightly.  Continue to work with rehab.  20.  Dysuria: UA showing evidence of pyuria.  Did not reflex to culture given squamous cells present.  Given clinical symptoms, plan to treat.  Recommend starting cephalexin.  Will discuss with attending.  LOS: 6 days A FACE TO FACE EVALUATION WAS PERFORMED  Modena Slater 12/08/2023, 7:08 AM  Internal medicine Resident PGY-2

## 2023-12-08 NOTE — Progress Notes (Signed)
  [facility] Progress Note   Subjective:   Seen during therapy - no CP/dyspnea. HD went much better yesterday - no UF.  Objective Vitals:   12/07/23 1847 12/07/23 2001 12/08/23 0500 12/08/23 0540  BP:  123/64  (!) 130/55  Pulse:  66  (!) 59  Resp:  16  18  Temp:  98.2 F (36.8 C)  97.9 F (36.6 C)  TempSrc:  Oral    SpO2:  98%  100%  Weight: 111.9 kg  111.9 kg   Height:       Physical Exam General: Well appearing, NAD. Room air.  Heart: RRR; no murmur Lungs: CTA anteriorly Abdomen: soft Extremities: No LE edema Dialysis Access: LUE AVF + t/b  Additional Objective Labs: Basic Metabolic Panel: Recent Labs  Lab 12/05/23 2037 12/05/23 2250 12/07/23 1111  NA 141 141 141  K 4.7 4.0 4.6  CL 104 105 102  CO2 26 23 26   GLUCOSE 182* 117* 174*  BUN 73* 72* 65*  CREATININE 5.26* 5.13* 5.05*  CALCIUM 9.9 9.7 9.9  PHOS 4.9* 4.6 5.4*   Liver Function Tests: Recent Labs  Lab 12/05/23 2037 12/05/23 2250 12/07/23 1111  ALBUMIN 3.5 3.6 3.7   CBC: Recent Labs  Lab 12/02/23 1521 12/05/23 2037 12/05/23 2250 12/07/23 1111  WBC 9.6 10.7* 11.0* 12.4*  NEUTROABS  --  7.0 7.1  --   HGB 12.4 11.3* 10.9* 11.2*  HCT 36.8 34.1* 33.9* 33.3*  MCV 90.6 92.9 92.6 92.2  PLT 244 241 245 233   Medications:   (feeding supplement) PROSource Plus  30 mL Oral BID BM   allopurinol  300 mg Oral Daily   amLODipine  5 mg Oral QHS   aspirin  81 mg Oral Daily   atorvastatin  80 mg Oral Daily   cephALEXin  250 mg Oral Daily   Chlorhexidine Gluconate Cloth  6 each Topical Q0600   clopidogrel  75 mg Oral Daily   heparin  5,000 Units Subcutaneous Q8H   hydrALAZINE  50 mg Oral BID   insulin aspart  0-9 Units Subcutaneous TID WC   insulin aspart  20 Units Subcutaneous TID WC   insulin glargine-yfgn  63 Units Subcutaneous QHS   losartan  100 mg Oral Daily   metoprolol tartrate  50 mg Oral BID   multivitamin  1 tablet Oral QHS   scopolamine  1 patch Transdermal Q72H    sevelamer carbonate  800 mg Oral TID WC    Dialysis Orders: MWF - [facility] 3:45hr, 400/800, EDW 115kg, 2K/2.5Ca bath, LUE AVF, 15g - unclear VDRA, ESA, heparin doses.   Assessment/Plan: Debility: In CIR, therapy going well. S/p L pons CVA. Ongoing double vision. On Plavix/ASA/statin. ESRD: Usual MWF schedule - next HD tomorrow. HTN/volume: BP stable, no edema. Still urinates. Does not usually require any UF with HD. Anemia of ESRD: Hgb 11.2 - no ESA for now. Secondary HPTH: Ca/Phos ok - continue sevelamer as binder. Nutrition: Alb improving, on protein supp T2DM: Insulin, per primary. CAD, Hx CABG Leukocytosis: Mild, no signs of infection - monitor.   Ozzie Hoyle, PA-C 12/08/2023, 11:55 AM  [employer]

## 2023-12-08 NOTE — Progress Notes (Signed)
Physical Therapy Weekly Progress Note  Patient Details  Name: Sharon Arias MRN: [id number] Date of Birth: Jul 02, 1961  Beginning of progress report period: December 03, 2023 End of progress report period: December 08, 2023  Today's Date: 12/08/2023 PT Individual Time: 4098-1191 and 4782-9562 and 1308-6578 PT Individual Time Calculation (min): 69 min and 24 min and 57 min  Patient has met 1 of 4 short term goals. Sharon Arias is making steady progress with therapy; however, dialysis effected her participation in therapy one day causing slower than anticipated progress. She continues to experience constant dizziness (central vertigo) even at rest that improves slightly with her eyes closed, she also has diplopia that improves with R eye closed, and peripheral vestibular symptoms. Her greatest deficits is impaired balance with L anterior LOB bias. She is performing supine<>sit supervision/mod-I, sit<>stands and stand pivot transfers using rollator with CGA/min A, and ambulating up to 167ft using rollator with CGA/min A. She will benefit from continued CIR level skilled physical therapy prior to D/Cing home with support from both her son and daughter.  Patient continues to demonstrate the following deficits muscle weakness, decreased cardiorespiratoy endurance, impaired timing and sequencing, unbalanced muscle activation, and decreased coordination, decreased visual acuity, decreased midline orientation, central origin and peripheral, and decreased standing balance, decreased postural control, and decreased balance strategies and therefore will continue to benefit from skilled PT intervention to increase functional independence with mobility.  Patient progressing toward long term goals..  Continue plan of care.  PT Short Term Goals  Week 1:  PT Short Term Goal 1 (Week 1): Pt will perform suipne<>sit mod-I PT Short Term Goal 1 - Progress (Week 1): Met PT Short Term Goal 2 (Week 1): Pt will perform  sit<>stands using LRAD with supervision PT Short Term Goal 2 - Progress (Week 1): Progressing toward goal PT Short Term Goal 3 (Week 1): Pt will perform bed<>chair transfers using LRAD with supervision PT Short Term Goal 3 - Progress (Week 1): Progressing toward goal PT Short Term Goal 4 (Week 1): Pt will ambulate at least 178ft using LRAD with supervision PT Short Term Goal 4 - Progress (Week 1): Progressing toward goal Week 2:  PT Short Term Goal 1 (Week 2): = to LTGs based on ELOS  Skilled Therapeutic Interventions/Progress Updates:  Ambulation/gait training;Community reintegration;DME/adaptive equipment instruction;Neuromuscular re-education;Psychosocial support;Stair training;UE/LE Strength taining/ROM;Balance/vestibular training;Discharge planning;Pain management;Skin care/wound management;Therapeutic Activities;UE/LE Coordination activities;Disease management/prevention;Functional mobility training;Patient/family education;Therapeutic Exercise;Visual/perceptual remediation/compensation   Session 1: Pt received sitting upright in bed and reports she usually rests on the days after dialysis, but despite that is agreeable to therapy session. Pt reports she is still having her constant central dizziness symptoms, but she is able to perform seated horizontal head rotations more easily with less increase in her dizziness. Pt reports she did still have 1 episode of rolling R in the bed last night with delayed onset of dizziness symptoms similar to experience in BPPV testing, but it only happened that 1 time. Reports still having diplopia that goes away when closing R eye. MD in/out for morning assessment. Pt denies any symptoms of lightheadedness with positional changes.  Sit<>stands using rollator with CGA for steadying throughout session. Gait training in/out bathroom using rollator with CGA for steadying - pt with increased postural sway today and staggering steps with wide BOS to maintain balance.  Standing with CGA performed LB clothing management without assist - continent of bladder.    Transported to/from gym in w/c for time management and energy conservation.  Gait  training ~159ft x2 using rollator with CGA/light min assist for steadying. Pt demonstrating the following gait deviations with therapist providing the described cuing and facilitation for improvement:  - having increased balance instability with staggering steps - continues to have wide BOS - continues to have increased instability when turning - slower gait speed with more cautious movements  Gait training 130ft using L HHA with min assist for balance - continues to have greatest balance deficit with 180degree turn towards L, continues to have wider BOS and guarded trunk posture to maintain balance.  *when turning to sit down after gait with L HHA pt has anterior LOB requiring mod A to maintain upright while going to sit in w/c  Dynamic gait training of side stepping down/back in front of hallway rail starting with L HHA progressed to no UE support but then dizziness and L lean becomes more severe - requires up to mod A due to L LOB - cuing throughout to weight shift R and posteriorly to maintain balance (due to L anterior LOB) - more difficulty stepping towards L, and tends to drag L foot when stepping towards R due to lack of full weight shift.  Reports increased low back pain after side stepping - performed seated forward back stretches using theraball with pain relief.   Dynamic standing balance task of:  - repeated L LE foot taps to 4" step progressed to cone taps with L foot to avoid pt being able to use that foot for balance support - 2x8 reps with min A for balance due to L LOB, does maintain wide BOS throughout to improve balance  Standing with L foot on 6" step while stacking cones with L hand on R side to promote sustained R weight shift with CGA/light min A for balance.  Transported back to room. Short  distance ~30ft ambulation w/c>EOB using rollator with min A due to L lean/LOB when turning. Pt left seated EOB with needs in reach and bed alarm on.   Session 2: Pt received sitting in recliner asleep, but easily awakens and is agreeable to therapy session. Focus of this therapy session on reviewing oculomotor exercises.   Performed the following with therapist controlling the speed and amplitude of the movements: - horizontal smooth pursuits x10reps  - vertical smooth pursuits, pt reports this used to cause her eyes to feel a painful stretch, but do not get that sensation any more x10reps  - gaze stabilization X1 (central target with horizontal head rotations) x10reps - pt reports having increased dizziness with L head rotations - gaze stabilization X2 - horizontal targets x10reps and then vertical targets x10reps - noticed with vertical target pt's eyes "swooped" in and down towards L when moving from superior target to inferior target  Pt reports having increased dizziness after eye exercises and states this happens every time after them. Educated pt on habituation and retraining for those systems.  Pt left seated in recliner with needs in reach and chair alarm on.    Session 3: Pt received sitting in recliner sitting upright and eager to participate in therapy session. Sit<>stands using rollator with CGA for steadying during session. Gait training ~252ft using rollator to main therapy gym with CGA/light min assist for steadying due to pt continuing to have L lateral lean/LOB with quick forward L step to recover balance.   Gait training 59ft using L HHA with min assist for balance - pt continues to have slightly wider BOS, L>R postural instability with path deviation, and  staggering steps to recover balance.   Gait training additional ~256ft, no UE support, with min assist for balance and occasional heavier min A - moderate L>R path deviation requiring increased assist to maintain upright -  continues to have wider BOS with staggering steps to recover balance - no significant increase in balance instability without UE support compared to using L HHA.  Dynamic gait training including: - forward/backwards walking 42ft, no UE support, but with constant min A and occasional heavy mod A when walking backwards due to L anterior LOB (often when pt attempts to correct her balance she overshoots causing worsening of her LOB) - requires cuing to step R LE out laterally and back otherwise R leg crosses back behind her causing worsening L LOB - side stepping down/back ~72ft each direction, no UE support, but with constant min A and occasional mod A - continues to have greater challenge stepping towards L due to constant L lean and poor R weight shift  Gait training ~2ft x2 to/from stairs, no UE support, with continued min A for balance.  Stair navigation training ascending/descending 8 steps (6" height) using B HRs with min assist for balance (has minor posterior LOB at bottom of steps) - reciprocal pattern on ascent (helps to decrease L lean) and then step-to pattern on descent leading with L LE.  Educated pt on recommendation not to drive her scooter at this time due to balance and dizziness concerns.  Standing L LE propped up on 4" step while holding ball in both hands and performing slow, controlled trunk rotations with focus on maintaining balance while weightbearing through R LE and avoiding L LOB - consistent light min A for balance.  Gait training ~291ft back to room using rollator with primarily CGA and a few instance of min A with improved R weight shift during R stance phase.   Pt left seated EOB with needs in reach, meal tray set-up, and bed alarm on.     Therapy Documentation Precautions:  Precautions Precautions: Fall, Posterior Hip Precaution Comments: low vision, vertical diplopia, spinning sensation with mobility and at rest Restrictions Weight Bearing Restrictions Per  Provider Order: No   Pain:  Session 1: Reports onset of her chronic low back pain during side stepping exercises - performed seated forward trunk flexion stretches using theraball and pt reports alleviation of her pain and able to continue with session.  Session 2: No reports of pain throughout session. Reports eyes feeling dry - notified nurse who was present to administer eye drops.  Session 3: No reports of pain throughout session.  Therapy/Group: Individual Therapy  Ginny Forth , PT, DPT, NCS, CSRS 12/08/2023, 7:55 AM

## 2023-12-09 LAB — CBC WITH DIFFERENTIAL/PLATELET
Abs Immature Granulocytes: 0.08 10*3/uL — ABNORMAL HIGH (ref 0.00–0.07)
Basophils Absolute: 0.1 10*3/uL (ref 0.0–0.1)
Basophils Relative: 1 %
Eosinophils Absolute: 0.5 10*3/uL (ref 0.0–0.5)
Eosinophils Relative: 4 %
HCT: 31.2 % — ABNORMAL LOW (ref 36.0–46.0)
Hemoglobin: 10.5 g/dL — ABNORMAL LOW (ref 12.0–15.0)
Immature Granulocytes: 1 %
Lymphocytes Relative: 22 %
Lymphs Abs: 2.5 10*3/uL (ref 0.7–4.0)
MCH: 31 pg (ref 26.0–34.0)
MCHC: 33.7 g/dL (ref 30.0–36.0)
MCV: 92 fL (ref 80.0–100.0)
Monocytes Absolute: 1.1 10*3/uL — ABNORMAL HIGH (ref 0.1–1.0)
Monocytes Relative: 10 %
Neutro Abs: 7.3 10*3/uL (ref 1.7–7.7)
Neutrophils Relative %: 62 %
Platelets: 234 10*3/uL (ref 150–400)
RBC: 3.39 MIL/uL — ABNORMAL LOW (ref 3.87–5.11)
RDW: 14.2 % (ref 11.5–15.5)
WBC: 11.5 10*3/uL — ABNORMAL HIGH (ref 4.0–10.5)
nRBC: 0 % (ref 0.0–0.2)

## 2023-12-09 LAB — RENAL FUNCTION PANEL
Albumin: 3.6 g/dL (ref 3.5–5.0)
Anion gap: 17 — ABNORMAL HIGH (ref 5–15)
BUN: 59 mg/dL — ABNORMAL HIGH (ref 8–23)
CO2: 22 mmol/L (ref 22–32)
Calcium: 9.7 mg/dL (ref 8.9–10.3)
Chloride: 103 mmol/L (ref 98–111)
Creatinine, Ser: 4.81 mg/dL — ABNORMAL HIGH (ref 0.44–1.00)
GFR, Estimated: 10 mL/min — ABNORMAL LOW (ref >60)
Glucose, Bld: 179 mg/dL — ABNORMAL HIGH (ref 70–99)
Phosphorus: 5.5 mg/dL — ABNORMAL HIGH (ref 2.5–4.6)
Potassium: 4.3 mmol/L (ref 3.5–5.1)
Sodium: 142 mmol/L (ref 135–145)

## 2023-12-09 LAB — GLUCOSE, CAPILLARY
Glucose-Capillary: 90 mg/dL (ref 70–99)
Glucose-Capillary: 130 mg/dL — ABNORMAL HIGH (ref 70–99)
Glucose-Capillary: 64 mg/dL — ABNORMAL LOW (ref 70–99)
Glucose-Capillary: 73 mg/dL (ref 70–99)

## 2023-12-09 MED ORDER — POLYETHYLENE GLYCOL 3350 17 G PO PACK: 17.0000 g | PACK | Freq: Every day | ORAL | Status: DC | PRN

## 2023-12-09 NOTE — Progress Notes (Signed)
  [facility] Progress Note   Subjective:  Seen in room - no new concerns today. No CP/dyspnea. For HD later today.  Objective Vitals:   12/08/23 0540 12/08/23 1352 12/08/23 1952 12/09/23 0317  BP: (!) 130/55 (!) 131/54 (!) 158/64 (!) 151/58  Pulse: (!) 59 62 66 (!) 55  Resp: 18 18 18 18   Temp: 97.9 F (36.6 C) 98.1 F (36.7 C) 98.1 F (36.7 C) 97.7 F (36.5 C)  TempSrc:   Oral Oral  SpO2: 100% 97% 94% 97%  Weight:      Height:       Physical Exam General: Well appearing, NAD. Room air.  Heart: RRR; no murmur Lungs: CTA anteriorly Abdomen: soft Extremities: No LE edema Dialysis Access: LUE AVF + t/b  Additional Objective Labs: Basic Metabolic Panel: Recent Labs  Lab 12/05/23 2037 12/05/23 2250 12/07/23 1111  NA 141 141 141  K 4.7 4.0 4.6  CL 104 105 102  CO2 26 23 26   GLUCOSE 182* 117* 174*  BUN 73* 72* 65*  CREATININE 5.26* 5.13* 5.05*  CALCIUM 9.9 9.7 9.9  PHOS 4.9* 4.6 5.4*   Liver Function Tests: Recent Labs  Lab 12/05/23 2037 12/05/23 2250 12/07/23 1111  ALBUMIN 3.5 3.6 3.7   CBC: Recent Labs  Lab 12/02/23 1521 12/05/23 2037 12/05/23 2250 12/07/23 1111  WBC 9.6 10.7* 11.0* 12.4*  NEUTROABS  --  7.0 7.1  --   HGB 12.4 11.3* 10.9* 11.2*  HCT 36.8 34.1* 33.9* 33.3*  MCV 90.6 92.9 92.6 92.2  PLT 244 241 245 233   CBG: Recent Labs  Lab 12/08/23 0621 12/08/23 1225 12/08/23 1744 12/08/23 2047 12/09/23 0654  GLUCAP 148* 151* 189* 155* 90   Medications:   (feeding supplement) PROSource Plus  30 mL Oral BID BM   allopurinol  300 mg Oral Daily   amLODipine  5 mg Oral QHS   aspirin  81 mg Oral Daily   atorvastatin  80 mg Oral Daily   cephALEXin  250 mg Oral Daily   Chlorhexidine Gluconate Cloth  6 each Topical Q0600   clopidogrel  75 mg Oral Daily   hydrALAZINE  50 mg Oral BID   insulin aspart  0-9 Units Subcutaneous TID WC   insulin aspart  20 Units Subcutaneous TID WC   insulin glargine-yfgn  63 Units Subcutaneous  QHS   losartan  100 mg Oral Daily   metoprolol tartrate  50 mg Oral BID   multivitamin  1 tablet Oral QHS   sevelamer carbonate  800 mg Oral TID WC    Dialysis Orders: MWF - [facility] 3:45hr, 400/800, EDW 115kg, 2K/2.5Ca bath, LUE AVF, 15g - unclear VDRA, ESA, heparin doses.   Assessment/Plan: Debility: In CIR, therapy going well. S/p L pons CVA. Weakness + double vision. On Plavix/ASA/statin. ESRD: Usual MWF schedule - for HD today, no UF. HTN/volume: BP stable, no edema. Still urinates. Does not usually require any UF with HD. Anemia of ESRD: Hgb 11.2 - no ESA for now. Secondary HPTH: Ca/Phos ok - continue sevelamer as binder. Nutrition: Alb improving, on protein supp T2DM: Insulin, per primary. CAD, Hx CABG Leukocytosis: Mild, no signs of infection - monitor.   Ozzie Hoyle, PA-C 12/09/2023, 12:23 PM  [facility]

## 2023-12-09 NOTE — Progress Notes (Signed)
Occupational Therapy Session Note  Patient Details  Name: Sharon Arias MRN: [id number] Date of Birth: 08/18/1961  Today's Date: 12/09/2023 OT Individual Time: 4098-1191 (MAKE UP MINUTES) OT Individual Time Calculation (min): 25 min    Short Term Goals: Week 1:  OT Short Term Goal 1 (Week 1): Patient to perform LE dresing with set up and vc's using AE as needed OT Short Term Goal 2 (Week 1): Patient to tolerate standing grooming at sink x 3 minutes SBA for safety OT Short Term Goal 3 (Week 1): Patien to perform toileting with SPV using DME as needed  Skilled Therapeutic Interventions/Progress Updates:    Pt received sitting EOB and agreeable to an extra OT session this am as she had missed some therapy time earlier in the week.  Pt discussed her dizziness and diplopia and how it is progressing but she continues to have some challenges. Pt agreeable to working on visual motor exercises.  Introduced the [employer] and had pt work on changing her visual fixation targets with focusing on beads at different distances. Pt able to focus on 1 bead and see the diverging strings.  With the closest bead to her nose, she would initially see double and then was able to progress to seeing a single bead.  Introduced pencil push up exercise with pt using a green chapstick container as her target. Pt practiced 10 reps at a time and was able to focus her eyes to see single target at near vision.  Pt participated well.  Resting EOB with all needs met, alarm set.   Therapy Documentation Precautions:  Precautions Precautions: Fall, Posterior Hip Precaution Comments: low vision, vertical diplopia, spinning sensation with mobility and at rest Restrictions Weight Bearing Restrictions Per Provider Order: No Pain: Pain Assessment Pain Scale: 0-10 Pain Score: 0-No pain    Therapy/Group: Individual Therapy  Dionel Archey 12/09/2023, 10:19 AM

## 2023-12-09 NOTE — Progress Notes (Signed)
Physical Therapy Session Note  Patient Details  Name: Sharon Arias MRN: [id number] Date of Birth: March 26, 1961  Today's Date: 12/09/2023 PT Individual Time: 0800-0855 PT Individual Time Calculation (min): 55 min   Short Term Goals: Week 2:  PT Short Term Goal 1 (Week 2): = to LTGs based on ELOS  Skilled Therapeutic Interventions/Progress Updates:    Chart reviewed and pt agreeable to therapy. Pt received seated EOB with no c/o pain. Also of note, pt reports 5/10 dizziness. Pt requested toileting at start of session and required light CGA + rollator to toilet and returning to [facility]. Session focused on functional transfers and high level amb to promote safe home access. Pt amb 243ft to therapy gym using CGA + rollator. Pt completed demonstration of vestibular exercises including lateral gaze stabilization with head rotation at end. Pt then tasked with L/R gaze stabilization exercise without head turn at end. Pt able to complete exercise at rate of ~40bpm with periodic VC for correction on keep head still when appropriate to exercise. Pt then same exercises up/down as vertical gaze stabilization with pt noted increased dizziness with downward gaze and same pace. Pt then amb 277ft + 376ft in hallway using CGA + rollator. Pt tasked with turning head with amb. Pt able to consistently complete 4 head turns before increase in dizziness symptoms, no nystagmus noted. Pt demonstrated good safety awareness to stop and observe environment at intersections to avoid increase in symptoms. In room, pt continued practice of gaze stabilization using green cone to support vision. Pt then taught calming exercise by putting hands over eyes with eyes open for 1 minute, with pt reporting decrease in dizziness symptoms after. Session education emphasized utilizing outpatient vestibular rehab as resource for intervention regarding dizziness. At end of session, pt was left seated of EOB with alarm engaged, nurse call bell and all  needs in reach.     Therapy Documentation Precautions:  Precautions Precautions: Fall, Posterior Hip Precaution Comments: low vision, vertical diplopia, spinning sensation with mobility and at rest Restrictions Weight Bearing Restrictions Per Provider Order: No General:    Other Treatments:      Therapy/Group: Individual Therapy  Dionne Milo, PT, DPT 12/09/2023, 8:59 AM

## 2023-12-09 NOTE — Progress Notes (Signed)
Physical Therapy Session Note  Patient Details  Name: Sharon Arias MRN: [id number] Date of Birth: Jul 30, 1961  Today's Date: 12/09/2023 PT Individual Time: 1106-1202 PT Individual Time Calculation (min): 56 min   Short Term Goals: Week 2:  PT Short Term Goal 1 (Week 2): = to LTGs based on ELOS  Skilled Therapeutic Interventions/Progress Updates: Patient sitting EOB on entrance to room. Patient alert and agreeable to PT session.   Patient reported no pain, only dizziness. Beginning of session focused on building pt rapport. Pt reported oculomotor exercises have been challenging, but still working through them. Pt reported how ambulating has been going and current challenge being dizziness when walking, and having to avoid looking at objects while moving. Pt educated on utilizing vertical line method to orient self to vertical position. Pt legally blind in B eyes, but still able to see objects in blur that are vertical. Pt willing to try this to see if it assists.  Therapeutic Activity: Transfers: Pt performed sit<>stand transfers throughout session with close supervision. Provided VC for use of back of knee to ensure surface is in safe spot.  Gait Training:  Pt ambulated from room<main gym using rollator with CGA. Pt then with seated rest break and ambulated around nsg/day room loop in rollator. Pt recalled initial cue of visually scanning as able to find vertical lines. Pt reported this helped significantly with confidence and upright standing balance. Pt with no LOB throughout, and required VC to pause prior to turning corners to locate vertical line.   Neuromuscular Re-ed: NMR facilitated during session with focus on dynamic standing balance, visual habituation, coordination of L UE. - Pt ambulating in day room gym with instructions to pinch pins off of basketball net (with L UE), turn 180*, ambulated 10', then pinch pins on pinch box. Pt to repeat sequence while in rollator. Pt cued to  scan for vertical lines while turning to assist in safe navigation, with added cues to increase turning speed as pt habituates. Pt required increased time at first to place pins on bars, but was able to adjust and pinch onto without issue after rest break. Pt stayed 5/10 on dizzy intensity throughout. Pt with CGA for safety and no LOB. Pt required rest break to rest eyes.   NMR performed for improvements in motor control and coordination, balance, sequencing, judgement, and self confidence/ efficacy in performing all aspects of mobility at highest level of independence.   Patient sitting EOB at end of session with brakes locked, bed alarm set, and all needs within reach.      Therapy Documentation Precautions:  Precautions Precautions: Fall, Posterior Hip Precaution Comments: low vision, vertical diplopia, spinning sensation with mobility and at rest Restrictions Weight Bearing Restrictions Per Provider Order: No   Therapy/Group: Individual Therapy  Shakemia Madera PTA 12/09/2023, 12:24 PM

## 2023-12-09 NOTE — Progress Notes (Signed)
PROGRESS NOTE   Subjective/Complaints:  Patient evaluated bedside this morning.  Patient reports that she slept okay last night.  She did have some dizziness, but is not worsening.  She states her dysuria has improved.  She denies any shortness of breath, chest pain, or cough.  She does have some nausea, but denies any vomiting.  Patient is eating and drinking well.  Otherwise she states she is doing well.  She states therapy went well yesterday.  ROS:   Patient denies any shortness of breath, chest pain, or leg swelling.  Patient does endorse some dizziness.   Negative except for HPI-   Objective:   No results found. Recent Labs    12/07/23 1111  WBC 12.4*  HGB 11.2*  HCT 33.3*  PLT 233   Recent Labs    12/07/23 1111  NA 141  K 4.6  CL 102  CO2 26  GLUCOSE 174*  BUN 65*  CREATININE 5.05*  CALCIUM 9.9    Intake/Output Summary (Last 24 hours) at 12/09/2023 0724 Last data filed at 12/08/2023 1813 Gross per 24 hour  Intake 600 ml  Output --  Net 600 ml        Physical Exam: Vital Signs Blood pressure (!) 151/58, pulse (!) 55, temperature 97.7 F (36.5 C), temperature source Oral, resp. rate 18, height 5\' 7"  (1.702 m), weight 111.9 kg, SpO2 97%.   General: Patient is resting comfortably in bed in no acute distress  Eyes: EOMI Cardio: Regular rate and rhythm, no murmurs, rubs or gallops.  No lower extremity edema appreciated Pulmonary: Clear to ausculation bilaterally with no rales, rhonchi, and crackles  MSK: 5/5 strength to upper and lower extremities.  Neuro: Finger-to-nose intact, 5/5 strength noted to bilateral upper and lower extremities, sensation intact      Assessment/Plan: 1. Functional deficits which require 3+ hours per day of interdisciplinary therapy in a comprehensive inpatient rehab setting. Physiatrist is providing close team supervision and 24 hour management of active medical  problems listed below. Physiatrist and rehab team continue to assess barriers to discharge/monitor patient progress toward functional and medical goals  Care Tool:  Bathing    Body parts bathed by patient: Right arm, Left upper leg, Right lower leg, Left arm, Chest, Left lower leg, Abdomen, Face, Front perineal area, Buttocks, Right upper leg         Bathing assist Assist Level: Contact Guard/Touching assist     Upper Body Dressing/Undressing Upper body dressing   What is the patient wearing?: Pull over shirt    Upper body assist Assist Level: Set up assist    Lower Body Dressing/Undressing Lower body dressing      What is the patient wearing?: Underwear/pull up, Pants     Lower body assist Assist for lower body dressing: Moderate Assistance - Patient 50 - 74%     Toileting Toileting    Toileting assist Assist for toileting: Contact Guard/Touching assist     Transfers Chair/bed transfer  Transfers assist     Chair/bed transfer assist level: Minimal Assistance - Patient > 75%     Locomotion Ambulation   Ambulation assist      Assist level: Minimal  Assistance - Patient > 75% Assistive device: Rollator Max distance: 257ft   Walk 10 feet activity   Assist     Assist level: Contact Guard/Touching assist Assistive device: Rollator   Walk 50 feet activity   Assist    Assist level: Minimal Assistance - Patient > 75% Assistive device: Rollator    Walk 150 feet activity   Assist Walk 150 feet activity did not occur: Safety/medical concerns  Assist level: Minimal Assistance - Patient > 75% Assistive device: Rollator    Walk 10 feet on uneven surface  activity   Assist     Assist level: Minimal Assistance - Patient > 75% Assistive device: Hand held assist, Other (comment) (railing)   Wheelchair     Assist Is the patient using a wheelchair?: Yes (to/from gym for energy conservation - uses an electric scooter for longer distances  baseline) Type of Wheelchair: Manual    Wheelchair assist level: Dependent - Patient 0%      Wheelchair 50 feet with 2 turns activity    Assist        Assist Level: Dependent - Patient 0%   Wheelchair 150 feet activity     Assist      Assist Level: Dependent - Patient 0%   Blood pressure (!) 151/58, pulse (!) 55, temperature 97.7 F (36.5 C), temperature source Oral, resp. rate 18, height 5\' 7"  (1.702 m), weight 111.9 kg, SpO2 97%.  Medical Problem List and Plan: 1. Functional deficits secondary to ischemic infarct left brachium pontis secondary to small vessel disease             -patient may  shower             -ELOS/Goals: 10-14 days, supervision PT, supervision OT, modified independent SLP              - Stable for admission to IRF   Con't CIR PT and OT  2.  Antithrombotics: -DVT/anticoagulation:  Pharmaceutical: Heparin             -antiplatelet therapy: Aspirin 81 mg daily and Plavix 75 mg daily as prior to admission 3. Pain Management: Tylenol as needed  12/14- denies pain- con't tlyenol prn 4. Mood/Behavior/Sleep: Provide emotional support             -antipsychotic agents: N/A 5. Neuropsych/cognition: This patient is capable of making decisions on her own behalf. 6. Skin/Wound Care: Routine skin checks 7. Fluids/Electrolytes/Nutrition: Routine in and outs with follow-up chemistries 8.  End-stage renal disease.  HD today.  Continue HD per nephrology 9.  CAD with CABG 2013.  No chest pain or shortness of breath.  Continue aspirin and Plavix 10.  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  Hemoglobin A1c 8.1. Novolog 20 units 3 times daily with meals. Semglee 63 units nightly.  Blood glucose does continue to measure well, did have fasting of 90 today, we will keep an eye on this.  If need to, can decrease insulin.  CBG (last 3)  Recent Labs    12/08/23 1744 12/08/23 2047 12/09/23 0654  GLUCAP 189* 155* 90  Fair control   11.   Hypertension.  Cozaar 100 mg daily, hydralazine 50 mg twice daily, Norvasc 5 mg daily, Lopressor 50 mg twice daily.  Scopolamine patch was removed, blood pressure did elevate into the 150s.  Given patient having dialysis today, will hold off on increasing antihypertensive medications at this time.   Vitals:   12/08/23 1952 12/09/23 0317  BP: [name])  158/64 (!) 151/58  Pulse: 66 (!) 55  Resp: 18 18  Temp: 98.1 F (36.7 C) 97.7 F (36.5 C)  SpO2: 94% 97%    12.  Hyperlipidemia.  Lipitor 13.  Diastolic congestive heart failure.  Lasix 40 mg Tuesday Thursday Saturday Sunday.  No signs of volume overload. 14.  History of gout.  Zyloprim 300 mg daily.  Monitor for any gout flares, no concern for gout flare at this time. 15.Obesity.  BMI 39.50.  Dietary follow-up 16.  Anemia of chronic disease.  No acute concerns at this time.  Hemoglobin slightly decreased, but no concerns for bleeding.  17. Constipation, no bowel movement yesterday, this makes 2 days with no bowel movement.  If need be, can give patient stool softener.  Will add on as needed.  18.  Diabetic retinopathy f/u optho   19.  Dizziness: Likely secondary to stroke.  Dizziness is improving slowly.  Patient still reports being dizzy.  Will continue with therapy.  20.  Acute Cystitis: UA showing evidence of pyuria.  Today 2 of 5 of cephalexin  LOS: 7 days A FACE TO FACE EVALUATION WAS PERFORMED  Modena Slater 12/09/2023, 7:24 AM  Internal medicine Resident PGY-2

## 2023-12-09 NOTE — Progress Notes (Signed)
Hypoglycemic Event  CBG: 64  Treatment: 4 oz juice/soda  Symptoms: None  Follow-up CBG: Time:1715 CBG Result:73  Possible Reasons for Event: Inadequate meal intake  Comments/MD notified: Aware    Garnetta Buddy

## 2023-12-09 NOTE — Progress Notes (Signed)
Pt to dialysis via bed with attendant.  Pt ate 100% of supper. Hilton Sinclair BSN RN CMSRN 12/09/2023, 7:47 PM

## 2023-12-09 NOTE — Plan of Care (Signed)
  Problem: RH BOWEL ELIMINATION Goal: RH STG MANAGE BOWEL WITH ASSISTANCE Description: STG Manage Bowel with toileting Assistance. Outcome: Progressing   Problem: RH SAFETY Goal: RH STG ADHERE TO SAFETY PRECAUTIONS W/ASSISTANCE/DEVICE Description: STG Adhere to Safety Precautions With cues Assistance/Device. Outcome: Progressing   Problem: RH KNOWLEDGE DEFICIT Goal: RH STG INCREASE KNOWLEDGE OF DIABETES Description: Patient and family will be able to manage DM using educational resources for medications and dietary modifications independently Outcome: Progressing Goal: RH STG INCREASE KNOWLEDGE OF HYPERTENSION Description: Patient and family will be able to manage HTN using educational resources for medications and dietary modifications independently Outcome: Progressing Goal: RH STG INCREASE KNOWLEGDE OF HYPERLIPIDEMIA Description: Patient and family will be able to manage HLD using educational resources for medications and dietary modifications independently Outcome: Progressing Goal: RH STG INCREASE KNOWLEDGE OF STROKE PROPHYLAXIS Description: Patient and family will be able to manage secondary risks using educational resources for medications and dietary modifications independently Outcome: Progressing

## 2023-12-09 NOTE — Progress Notes (Signed)
Occupational Therapy Weekly Progress Note  Patient Details  Name: Sharon Arias MRN: [id number] Date of Birth: 08-05-1961  Beginning of progress report period: December 03, 2023 End of progress report period: December 09, 2023  Today's Date: 12/09/2023 OT Individual Time: 4098-1191 OT Individual Time Calculation (min): 60 min  and Today's Date: 12/09/2023 OT Missed Time: 15 Minutes Missed Time Reason: Patient fatigue   Patient has met 3 of 3 short term goals. Sharon Arias is making steady progress towards reaching her LTGs and is motivated to participate in therapy. She is completing U/LB dressing with set-up assistance, U/LB bathing with supervision, grooming/hygiene task with set-up assist, and toileting with close supervision. She is using her rollator for functional transfers and to balance during BADLs and she is completing all functional mobility and tranfers at close supervision to CGA level. She continues to demonstrate double vision, visual fixation, and visual tracking impairments as well as dizziness and nystagmus which limits her safety and independence in BADLs. Sharon Arias is also limited by her activity tolerance deficits and occasional challenges with sleep that impact her daily participation in BADLs. She is planning to d/c home to her house where she will live in an accessible apartment attached to her home and her DTR will be moving in to the house to provide intermittent supervision and assistance with IADLs.   Patient continues to demonstrate the following deficits: muscle weakness, decreased cardiorespiratoy endurance, decreased coordination, decreased visual acuity, decreased visual perceptual skills, and decreased visual motor skills, central origin, and decreased sitting balance, decreased standing balance, decreased postural control, and decreased balance strategies and therefore will continue to benefit from skilled OT intervention to enhance overall performance with BADL,  iADL, and Reduce care partner burden.  Patient progressing toward long term goals..  Continue plan of care.  OT Short Term Goals Week 1:  OT Short Term Goal 1 (Week 1): Patient to perform LE dresing with set up and vc's using AE as needed OT Short Term Goal 1 - Progress (Week 1): Met OT Short Term Goal 2 (Week 1): Patient to tolerate standing grooming at sink x 3 minutes SBA for safety OT Short Term Goal 2 - Progress (Week 1): Met OT Short Term Goal 3 (Week 1): Patien to perform toileting with SPV using DME as needed OT Short Term Goal 3 - Progress (Week 1): Met Week 2:  OT Short Term Goal 1 (Week 2): STG=LTG d/t ELOS  Skilled Therapeutic Interventions/Progress Updates:     Sharon Arias received sitting EOB presenting to be in good spirits receptive to skilled OT session reporting 0/10 pain- OT offering intermittent rest breaks, repositioning, and therapeutic support to optimize participation in therapy session. Sharon Arias continuing to present with dizziness and double vision with mild improvement noted when sitting. Sharon Arias requesting to take shower this AM. Focused session on BADL retraining with emphasis on safety awareness, activity tolerance, and completing Sharon Arias's BADL routine to increase safety at d/c. Set-up environment to simulate home environment. Sharon Arias completed functional mobility in her room using rollator and retrieved her clothing from drawers with close supervision. Sharon Arias able to transport items to BR using rollator and transfer to toilet with close supervision. Increased time provided on toilet- continent void documented in flowsheets. Sharon Arias doffed clothing in seated position supervision and transferred to walk-in shower using grab bars with CGA provided for safety. Once seated in shower, Sharon Arias able to complete U/LB bathing with close supervision, briefly standing while holding grab bars in order to wash bottom. Sharon Arias  dried self and completed U/LB dressing seated on TTB following shower with set-up assist. Sharon Arias completed  functional mobility from bathroom to EOB using rollator with close supervision with single LOB noted when crossing threshold, however Sharon Arias able to correct using rollator and reaching for doorframe for balance. Sharon Arias completed grooming/hygiene tasks seated EOB with set-up assist. Sharon Arias demonstrating appropriate application of energy conservation techniques and good safety awareness- providing self increased time for BADLs, completing tasks seated when possible, taking increased rest breaks, and setting up environment to facilitate success. Sharon Arias limited by decreased activity tolerance and balance/vision deficits. Sharon Arias requesting to finish session early to allow for time to rest prior to upcoming Sharon Arias session. Missed 15 minutes skilled OT treatment d/t fatigue. Will attempt to make up time as schedule and Sharon Arias's status allows. Sharon Arias was left resting EOB with call bell in reach, bed alarm on, and all needs met.    Therapy Documentation Precautions:  Precautions Precautions: Fall, Posterior Hip Precaution Comments: low vision, vertical diplopia, spinning sensation with mobility and at rest Restrictions Weight Bearing Restrictions Per Provider Order: No  Therapy/Group: Individual Therapy  Clide Deutscher 12/09/2023, 10:43 AM

## 2023-12-10 LAB — GLUCOSE, CAPILLARY
Glucose-Capillary: 126 mg/dL — ABNORMAL HIGH (ref 70–99)
Glucose-Capillary: 161 mg/dL — ABNORMAL HIGH (ref 70–99)
Glucose-Capillary: 224 mg/dL — ABNORMAL HIGH (ref 70–99)
Glucose-Capillary: 216 mg/dL — ABNORMAL HIGH (ref 70–99)
Glucose-Capillary: 167 mg/dL — ABNORMAL HIGH (ref 70–99)

## 2023-12-10 MED ORDER — CHLORHEXIDINE GLUCONATE CLOTH 2 % EX PADS
6.0000 | MEDICATED_PAD | Freq: Every day | CUTANEOUS | Status: DC
  Administered 2023-12-13: 6 via TOPICAL

## 2023-12-10 NOTE — Progress Notes (Signed)
Pt received back from Dialysis.  No events reported during dialysis session.  Glucose 126 upon return.  Discussed with [employer] PA on call and will hold evening dose of Levimir given hypoglycemic episode at suppertime.  Pt in agreement.    12/10/23 0227  Vitals  Temp 98.2 F (36.8 C)  Temp Source Oral  BP (!) 147/50  MAP (mmHg) 78  BP Location Right Arm  BP Method Automatic  Patient Position (if appropriate) Sitting  Pulse Rate 64  Resp 18  MEWS COLOR  MEWS Score Color Green  Oxygen Therapy  SpO2 94 %  O2 Device Room Air  Pain Assessment  Pain Scale 0-10  Pain Score 0  POSS Scale (Pasero Opioid Sedation Scale)  POSS *See Group Information* 1-Acceptable,Awake and alert  MEWS Score  MEWS Temp 0  MEWS Systolic 0  MEWS Pulse 0  MEWS RR 0  MEWS LOC 0  MEWS Score 0   Hilton Sinclair BSN RN [facility] 12/10/2023, 2:57 AM

## 2023-12-10 NOTE — Progress Notes (Signed)
   12/10/23 0038  Vitals  Pulse Rate 60  ECG Heart Rate (!) 58  Resp 12  Oxygen Therapy  SpO2 96 %  During Treatment Monitoring  Blood Flow Rate (mL/min) 349 mL/min  Arterial Pressure (mmHg) -162.01 mmHg  Venous Pressure (mmHg) 249.89 mmHg  TMP (mmHg) 10.5 mmHg  Ultrafiltration Rate (mL/min) 612 mL/min  Dialysate Flow Rate (mL/min) 300 ml/min  Duration of HD Treatment -hour(s) 3.45 hour(s)  Cumulative Fluid Removed (mL) per Treatment  -27.64  Intra-Hemodialysis Comments Tx completed  Post Treatment  Dialyzer Clearance Lightly streaked  Liters Processed 71.8  Fluid Removed (mL) 0 mL  Tolerated HD Treatment Yes  Post-Hemodialysis Comments pain  scale 9, tylenol 650 for headache  AVG/AVF Arterial Site Held (minutes) 10 minutes  AVG/AVF Venous Site Held (minutes) 7 minutes  Fistula / Graft Left Upper arm Arteriovenous fistula  No placement date or time found.   Orientation: Left  Access Location: Upper arm  Access Type: Arteriovenous fistula  Site Condition No complications  Fistula / Graft Assessment Present;Thrill;Bruit  Status Deaccessed  Needle Size 15  Drainage Description None   After first 45 minutes set change  due to clotting , unable to return blood. Arterial re-cannulate. Pt experience head pain, tylenol 650mg  po administered. Pt stable post treatment pain subsided to level  of 3.

## 2023-12-10 NOTE — Progress Notes (Signed)
[facility] Progress Note   Subjective:   Patient seen and examined at bedside.  Reports being very tired due to be in dialysis late last night.  No other specific complaints.  Denies CP, SOB, abdominal pain and n/v/d.    Objective Vitals:   12/10/23 0030 12/10/23 0038 12/10/23 0227 12/10/23 0630  BP: (!) 130/52  (!) 147/50   Pulse: 63 60 64   Resp: 13 12 18    Temp:   98.2 F (36.8 C)   TempSrc:   Oral   SpO2: 96% 96% 94%   Weight:    116.3 kg  Height:       Physical Exam General:chronically ill appearing female in NAD Heart:RRR, no mrg Lungs:CTAB, nml WOB on RA Abdomen:soft, NTND Extremities:no LE edema Dialysis Access: LU AVF +b/t   Filed Weights   12/07/23 1847 12/08/23 0500 12/10/23 0630  Weight: 111.9 kg 111.9 kg 116.3 kg    Intake/Output Summary (Last 24 hours) at 12/10/2023 1210 Last data filed at 12/10/2023 0038 Gross per 24 hour  Intake 240 ml  Output 0 ml  Net 240 ml    Additional Objective Labs: Basic Metabolic Panel: Recent Labs  Lab 12/05/23 2250 12/07/23 1111 12/09/23 2008  NA 141 141 142  K 4.0 4.6 4.3  CL 105 102 103  CO2 23 26 22   GLUCOSE 117* 174* 179*  BUN 72* 65* 59*  CREATININE 5.13* 5.05* 4.81*  CALCIUM 9.7 9.9 9.7  PHOS 4.6 5.4* 5.5*   Liver Function Tests: Recent Labs  Lab 12/05/23 2250 12/07/23 1111 12/09/23 2008  ALBUMIN 3.6 3.7 3.6   CBC: Recent Labs  Lab 12/05/23 2037 12/05/23 2250 12/07/23 1111 12/09/23 2008  WBC 10.7* 11.0* 12.4* 11.5*  NEUTROABS 7.0 7.1  --  7.3  HGB 11.3* 10.9* 11.2* 10.5*  HCT 34.1* 33.9* 33.3* 31.2*  MCV 92.9 92.6 92.2 92.0  PLT 241 245 233 234   CBG: Recent Labs  Lab 12/09/23 1636 12/09/23 1720 12/10/23 0229 12/10/23 0651 12/10/23 1106  GLUCAP 64* 73 126* 161* 224*    Medications:   (feeding supplement) PROSource Plus  30 mL Oral BID BM   allopurinol  300 mg Oral Daily   amLODipine  5 mg Oral QHS   aspirin  81 mg Oral Daily   atorvastatin  80 mg Oral  Daily   cephALEXin  250 mg Oral Daily   Chlorhexidine Gluconate Cloth  6 each Topical Q0600   clopidogrel  75 mg Oral Daily   hydrALAZINE  50 mg Oral BID   insulin aspart  0-9 Units Subcutaneous TID WC   insulin aspart  20 Units Subcutaneous TID WC   insulin glargine-yfgn  63 Units Subcutaneous QHS   losartan  100 mg Oral Daily   metoprolol tartrate  50 mg Oral BID   multivitamin  1 tablet Oral QHS   sevelamer carbonate  800 mg Oral TID WC    Dialysis Orders: MWF - [facility] 3:45hr, 400/800, EDW 115kg, 2K/2.5Ca bath, LUE AVF, 15g - unclear VDRA, ESA, heparin doses.   Assessment/Plan: Debility: In CIR, therapy going well. S/p L pons CVA. Weakness + double vision. On Plavix/ASA/statin. ESRD: Usual MWF schedule - for HD tomorrow.  HTN/volume: BP stable, no edema. Still urinates. Does not usually require any UF with HD.   Anemia of ESRD: Hgb 10.5 - no ESA for now. Secondary HPTH: Ca/Phos ok - continue sevelamer as binder. Nutrition: Alb improving, on protein supp T2DM: Insulin, per primary. CAD,  Hx CABG Leukocytosis: Mild, no signs of infection - monitor.  Virgina Norfolk, PA-C [facility] 12/10/2023,12:10 PM  LOS: 8 days

## 2023-12-11 ENCOUNTER — Inpatient Hospital Stay (HOSPITAL_COMMUNITY): Payer: Medicare Other

## 2023-12-11 DIAGNOSIS — I2510 Atherosclerotic heart disease of native coronary artery without angina pectoris: Secondary | ICD-10-CM | POA: Diagnosis not present

## 2023-12-11 DIAGNOSIS — N186 End stage renal disease: Secondary | ICD-10-CM | POA: Diagnosis not present

## 2023-12-11 DIAGNOSIS — I639 Cerebral infarction, unspecified: Secondary | ICD-10-CM | POA: Diagnosis not present

## 2023-12-11 DIAGNOSIS — E1140 Type 2 diabetes mellitus with diabetic neuropathy, unspecified: Secondary | ICD-10-CM | POA: Diagnosis not present

## 2023-12-11 LAB — RENAL FUNCTION PANEL
Albumin: 3.5 g/dL (ref 3.5–5.0)
Anion gap: 13 (ref 5–15)
BUN: 43 mg/dL — ABNORMAL HIGH (ref 8–23)
CO2: 28 mmol/L (ref 22–32)
Calcium: 9.6 mg/dL (ref 8.9–10.3)
Chloride: 100 mmol/L (ref 98–111)
Creatinine, Ser: 4.61 mg/dL — ABNORMAL HIGH (ref 0.44–1.00)
GFR, Estimated: 10 mL/min — ABNORMAL LOW (ref >60)
Glucose, Bld: 164 mg/dL — ABNORMAL HIGH (ref 70–99)
Phosphorus: 5.8 mg/dL — ABNORMAL HIGH (ref 2.5–4.6)
Potassium: 4.1 mmol/L (ref 3.5–5.1)
Sodium: 141 mmol/L (ref 135–145)

## 2023-12-11 LAB — GLUCOSE, CAPILLARY
Glucose-Capillary: 124 mg/dL — ABNORMAL HIGH (ref 70–99)
Glucose-Capillary: 175 mg/dL — ABNORMAL HIGH (ref 70–99)
Glucose-Capillary: 115 mg/dL — ABNORMAL HIGH (ref 70–99)

## 2023-12-11 NOTE — Progress Notes (Signed)
PROGRESS NOTE   Subjective/Complaints:  Pt felt some post neck pain during last HD session , no BP drop during HD, got back ~3am Sat am and feels some increased facial droop since that time but did not notify nursing  Has HD this afternoon, holiday schedule   ROS:   Patient denies any shortness of breath, chest pain, or leg swelling.  Patient does endorse some dizziness.   Negative except for HPI-   Objective:   No results found. Recent Labs    12/09/23 2008  WBC 11.5*  HGB 10.5*  HCT 31.2*  PLT 234   Recent Labs    12/09/23 2008 12/11/23 0744  NA 142 141  K 4.3 4.1  CL 103 100  CO2 22 28  GLUCOSE 179* 164*  BUN 59* 43*  CREATININE 4.81* 4.61*  CALCIUM 9.7 9.6    Intake/Output Summary (Last 24 hours) at 12/11/2023 1114 Last data filed at 12/10/2023 1816 Gross per 24 hour  Intake 360 ml  Output --  Net 360 ml        Physical Exam: Vital Signs Blood pressure (!) 130/54, pulse (!) 57, temperature 97.8 F (36.6 C), temperature source Oral, resp. rate 16, height 5\' 7"  (1.702 m), weight 116 kg, SpO2 97%.     General: No acute distress Mood and affect are appropriate Heart: Regular rate and rhythm no rubs murmurs or extra sounds Lungs: Clear to auscultation, breathing unlabored, no rales or wheezes Abdomen: Positive bowel sounds, soft nontender to palpation, nondistended Extremities: No clubbing, cyanosis, or edema Skin: No evidence of breakdown, no evidence of rash Neurologic: Cranial nerves II through XII intact, motor strength is 5/5 in bilateral deltoid, bicep, tricep, grip, hip flexor, knee extensors, ankle dorsiflexor and plantar flexor Sensory exam normal sensation to light touch and proprioception in bilateral upper and lower extremities Normal facial sensation  EOMI Left upper and lower facial droop , moderate  Cerebellar exam normal finger to nose to finger as well as heel to shin in  bilateral upper and lower extremities Musculoskeletal: Full range of motion in all 4 extremities. No joint swelling       Assessment/Plan: 1. Functional deficits which require 3+ hours per day of interdisciplinary therapy in a comprehensive inpatient rehab setting. Physiatrist is providing close team supervision and 24 hour management of active medical problems listed below. Physiatrist and rehab team continue to assess barriers to discharge/monitor patient progress toward functional and medical goals  Care Tool:  Bathing    Body parts bathed by patient: Right arm, Left upper leg, Right lower leg, Left arm, Chest, Left lower leg, Abdomen, Face, Front perineal area, Buttocks, Right upper leg         Bathing assist Assist Level: Contact Guard/Touching assist     Upper Body Dressing/Undressing Upper body dressing   What is the patient wearing?: Pull over shirt    Upper body assist Assist Level: Set up assist    Lower Body Dressing/Undressing Lower body dressing      What is the patient wearing?: Underwear/pull up, Pants     Lower body assist Assist for lower body dressing: Moderate Assistance - Patient 50 - 74%  Toileting Toileting    Toileting assist Assist for toileting: Contact Guard/Touching assist     Transfers Chair/bed transfer  Transfers assist     Chair/bed transfer assist level: Minimal Assistance - Patient > 75%     Locomotion Ambulation   Ambulation assist      Assist level: Minimal Assistance - Patient > 75% Assistive device: Rollator Max distance: 260ft   Walk 10 feet activity   Assist     Assist level: Contact Guard/Touching assist Assistive device: Rollator   Walk 50 feet activity   Assist    Assist level: Minimal Assistance - Patient > 75% Assistive device: Rollator    Walk 150 feet activity   Assist Walk 150 feet activity did not occur: Safety/medical concerns  Assist level: Minimal Assistance - Patient >  75% Assistive device: Rollator    Walk 10 feet on uneven surface  activity   Assist     Assist level: Minimal Assistance - Patient > 75% Assistive device: Hand held assist, Other (comment) (railing)   Wheelchair     Assist Is the patient using a wheelchair?: Yes (to/from gym for energy conservation - uses an electric scooter for longer distances baseline) Type of Wheelchair: Manual    Wheelchair assist level: Dependent - Patient 0%      Wheelchair 50 feet with 2 turns activity    Assist        Assist Level: Dependent - Patient 0%   Wheelchair 150 feet activity     Assist      Assist Level: Dependent - Patient 0%   Blood pressure (!) 130/54, pulse (!) 57, temperature 97.8 F (36.6 C), temperature source Oral, resp. rate 16, height 5\' 7"  (1.702 m), weight 116 kg, SpO2 97%.  Medical Problem List and Plan: 1. Functional deficits secondary to ischemic infarct left brachium pontis secondary to small vessel disease Increased facial droop, discussed with Vascular neuro who rec MRI , to see if CVA extension , symptoms are limited to anatomic area of previous infarct and are limited to facial droop, differential includes Bell's Palsy             -patient may  shower             -ELOS/Goals: 10-14 days, supervision PT, supervision OT, modified independent SLP              - Stable for admission to IRF   Con't CIR PT and OT  2.  Antithrombotics: -DVT/anticoagulation:  Pharmaceutical: Heparin             -antiplatelet therapy: Aspirin 81 mg daily and Plavix 75 mg daily as prior to admission 3. Pain Management: Tylenol as needed  12/14- denies pain- con't tlyenol prn 4. Mood/Behavior/Sleep: Provide emotional support             -antipsychotic agents: N/A 5. Neuropsych/cognition: This patient is capable of making decisions on her own behalf. 6. Skin/Wound Care: Routine skin checks 7. Fluids/Electrolytes/Nutrition: Routine in and outs with follow-up chemistries 8.   End-stage renal disease.  HD today.  Continue HD per nephrology 9.  CAD with CABG 2013.  No chest pain or shortness of breath.  Continue aspirin and Plavix 10.  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  Hemoglobin A1c 8.1. Novolog 20 units 3 times daily with meals. Semglee 63 units nightly.  Blood glucose does continue to measure well, did have fasting of 90 today, we will keep an eye on this.  If need  to, can decrease insulin.  CBG (last 3)  Recent Labs    12/10/23 1650 12/10/23 2119 12/11/23 0643  GLUCAP 216* 167* 124*  Fair control   11.  Hypertension.  Cozaar 100 mg daily, hydralazine 50 mg twice daily, Norvasc 5 mg daily, Lopressor 50 mg twice daily.  Scopolamine patch was removed, blood pressure did elevate into the 150s.  Given patient having dialysis today, will hold off on increasing antihypertensive medications at this time.   Vitals:   12/10/23 2005 12/11/23 0429  BP: (!) 147/54 (!) 130/54  Pulse: 62 (!) 57  Resp: 16 16  Temp: 98.2 F (36.8 C) 97.8 F (36.6 C)  SpO2: 98% 97%  Adequate control  12.  Hyperlipidemia.  Lipitor 13.  Diastolic congestive heart failure.  Lasix 40 mg Tuesday Thursday Saturday Sunday.  No signs of volume overload. 14.  History of gout.  Zyloprim 300 mg daily.  Monitor for any gout flares, no concern for gout flare at this time. 15.Obesity.  BMI 39.50.  Dietary follow-up 16.  Anemia of chronic disease.  No acute concerns at this time.  Hemoglobin slightly decreased, but no concerns for bleeding.  17. Constipation, no bowel movement yesterday, this makes 2 days with no bowel movement.  If need be, can give patient stool softener.  Will add on as needed.  18.  Diabetic retinopathy f/u optho   19.  Dizziness: Likely secondary to stroke.  Dizziness is improving slowly.   Will continue with therapy.  20.  Acute Cystitis: UA showing evidence of pyuria.  Today 4 of 5 of cephalexin  LOS: 9 days A FACE TO FACE EVALUATION WAS  PERFORMED  Erick Colace 12/11/2023, 11:14 AM  Internal medicine Resident PGY-2

## 2023-12-11 NOTE — Progress Notes (Signed)
Occupational Therapy Session Note  Patient Details  Name: Sharon Arias MRN: [id number] Date of Birth: Oct 14, 1961  Today's Date: 12/11/2023 OT Individual Time: 0930-1040 OT Individual Time Calculation (min): 70 min    Short Term Goals: Week 1:  OT Short Term Goal 1 (Week 1): Patient to perform LE dresing with set up and vc's using AE as needed OT Short Term Goal 1 - Progress (Week 1): Met OT Short Term Goal 2 (Week 1): Patient to tolerate standing grooming at sink x 3 minutes SBA for safety OT Short Term Goal 2 - Progress (Week 1): Met OT Short Term Goal 3 (Week 1): Patien to perform toileting with SPV using DME as needed OT Short Term Goal 3 - Progress (Week 1): Met  Skilled Therapeutic Interventions/Progress Updates:    Pt seated EOB upon arrival. OT intervention with focus on functional amb with Rollator, discharge planning, practice using reacher to obtains items from floor, LUE function, simulated kitchen tasks, safety awareness, and activity tolerance to increase independence with BADLs. Pt initially practiced using reacher to retrieve items from floor while seated, progressing to retrieving varied items from floor while standing with Rollator. Pt has ordered reacher from [employer]. Pt practiced removing lids from variouse household itesm (toothpaste, deodorant, lotion, water bottle, medicine bottle). Pt reports LUE weakness but able to use functionally. Pt amb with Rollator to gym from day room and stood at counter to retrieve items from shelving. Recommended pt not use reacher for items on top shelf. Recommended pt move commonly used items from top shelf to bottorm shelf. Pt in agreement. Pt amb with Rollator 258' back to room. Pt remained seated EOB. Bed alarm activated. All needs within reach. Pt reported diplopia and dizziness improving each day.   Therapy Documentation Precautions:  Precautions Precautions: Fall, Posterior Hip Precaution Comments: low vision, vertical diplopia,  spinning sensation with mobility and at rest Restrictions Weight Bearing Restrictions Per Provider Order: No   Pain: Pt denies pain this morning   Therapy/Group: Individual Therapy  Rich Brave 12/11/2023, 10:42 AM

## 2023-12-11 NOTE — Progress Notes (Signed)
Physical Therapy Session Note  Patient Details  Name: Sharon Arias MRN: [id number] Date of Birth: [birth year]  Today's Date: 12/11/2023 PT Individual Time: 4098-1191 PT Individual Time Calculation (min): 70 min   Short Term Goals: Week 1:  PT Short Term Goal 1 (Week 1): Pt will perform suipne<>sit mod-I PT Short Term Goal 1 - Progress (Week 1): Met PT Short Term Goal 2 (Week 1): Pt will perform sit<>stands using LRAD with supervision PT Short Term Goal 2 - Progress (Week 1): Progressing toward goal PT Short Term Goal 3 (Week 1): Pt will perform bed<>chair transfers using LRAD with supervision PT Short Term Goal 3 - Progress (Week 1): Progressing toward goal PT Short Term Goal 4 (Week 1): Pt will ambulate at least 130ft using LRAD with supervision PT Short Term Goal 4 - Progress (Week 1): Progressing toward goal  Skilled Therapeutic Interventions/Progress Updates:    Pt presents in bed, reporting concerns in regard to HD, therapies, and discharge. Pt reporting that she takes a full 24 hrs generally to recover and feels that she has been missing therapies because of this and wanting to see about moving up her discharge date. Will send a message to the team to discuss. Also will follow up about her schedule with HD also. Discussed option for 15/7 schedule as well, but pt stating since she usually sleeps the whole next day, it would still be a day she feels like she doesn't get to participate much.  Discussed overall goals and d/c plan with pt planning to move into her apartment which has ramped access about 1 month after discharge. Due to having HD today due to holiday change, pt declines working on balance because she feels like it takes too much energy for her. Focused on overall endurance and strength training with gait on unit > 200' with self monitoring for needed seated and standing rest breaks. Requires frequent rest breaks as session continues. Pt independent with management of rollator  during mobility training. Performed simulated car transfer for preparation for d/c and pt performed at supervision level with receptiveness to technique of sitting on seat first and bringing legs in as she states the way she usually tries to get in is tiresome and doesn't work well. Also practiced gait over ramp to simulate home entry and community access with supervision. Pt performing transfers at overall supervision to modified independent throughout session with main focus on endurance and activity tolerance. End of session transferred to bed with all needs in reach.     Therapy Documentation Precautions:  Precautions Precautions: Fall, Posterior Hip Precaution Comments: low vision, vertical diplopia, spinning sensation with mobility and at rest Restrictions Weight Bearing Restrictions Per Provider Order: No  Pain:  Denies pain.    Therapy/Group: Individual Therapy  Karolee Stamps Darrol Poke, PT, DPT, CBIS  12/11/2023, 1:35 PM

## 2023-12-11 NOTE — Plan of Care (Signed)
  Problem: Consults Goal: RH STROKE PATIENT EDUCATION Description: See Patient Education module for education specifics  Outcome: Progressing   Problem: RH BOWEL ELIMINATION Goal: RH STG MANAGE BOWEL WITH ASSISTANCE Description: STG Manage Bowel with toileting Assistance. Outcome: Progressing Goal: RH STG MANAGE BOWEL W/MEDICATION W/ASSISTANCE Description: STG Manage Bowel with Medication with mod I Assistance. Outcome: Progressing   Problem: RH SAFETY Goal: RH STG ADHERE TO SAFETY PRECAUTIONS W/ASSISTANCE/DEVICE Description: STG Adhere to Safety Precautions With cues Assistance/Device. Outcome: Progressing   Problem: RH KNOWLEDGE DEFICIT Goal: RH STG INCREASE KNOWLEDGE OF DIABETES Description: Patient and family will be able to manage DM using educational resources for medications and dietary modifications independently Outcome: Progressing Goal: RH STG INCREASE KNOWLEDGE OF HYPERTENSION Description: Patient and family will be able to manage HTN using educational resources for medications and dietary modifications independently Outcome: Progressing Goal: RH STG INCREASE KNOWLEGDE OF HYPERLIPIDEMIA Description: Patient and family will be able to manage HLD using educational resources for medications and dietary modifications independently Outcome: Progressing Goal: RH STG INCREASE KNOWLEDGE OF STROKE PROPHYLAXIS Description: Patient and family will be able to manage secondary risks using educational resources for medications and dietary modifications independently Outcome: Progressing

## 2023-12-11 NOTE — Progress Notes (Signed)
Occupational Therapy Session Note  Patient Details  Name: Sharon Arias MRN: [id number] Date of Birth: 1961-02-15  Today's Date: 12/11/2023 OT Missed Time: 45 Minutes Missed Time Reason: Other (comment) (HD schedule)   Short Term Goals: Week 2:  OT Short Term Goal 1 (Week 2): STG=LTG d/t ELOS  Skilled Therapeutic Interventions/Progress Updates:  Pt missing ~45 minutes of skilled intervention due HD schedule.   Therapy Documentation Precautions:  Precautions Precautions: Fall, Posterior Hip Precaution Comments: low vision, vertical diplopia, spinning sensation with mobility and at rest Restrictions Weight Bearing Restrictions Per Provider Order: No   Therapy/Group: Individual Therapy  Lou Cal, OTR/L, MSOT  12/11/2023, 1:10 PM

## 2023-12-11 NOTE — Progress Notes (Signed)
[facility] Progress Note   Subjective:   Patient seen and examined at bedside in room.  Reports severe headache radiating to her neck in HD Friday.  No drops in BP and 0UF during HD Friday.  Continues to have mild HA now and noticed increase facial droop today.  MRI completed with results pending.  No other specific complaints.   Objective Vitals:   12/10/23 0630 12/10/23 2005 12/11/23 0429 12/11/23 0500  BP:  (!) 147/54 (!) 130/54   Pulse:  62 (!) 57   Resp:  16 16   Temp:  98.2 F (36.8 C) 97.8 F (36.6 C)   TempSrc:  Oral Oral   SpO2:  98% 97%   Weight: 116.3 kg   116 kg  Height:       Physical Exam General:chronically ill appearing female with L sided facial droop Heart:RRR Lungs:CTAB Abdomen:soft, NTND Extremities:trace LE edema Dialysis Access: LU AVF   Filed Weights   12/08/23 0500 12/10/23 0630 12/11/23 0500  Weight: 111.9 kg 116.3 kg 116 kg    Intake/Output Summary (Last 24 hours) at 12/11/2023 1447 Last data filed at 12/11/2023 1300 Gross per 24 hour  Intake 240 ml  Output --  Net 240 ml    Additional Objective Labs: Basic Metabolic Panel: Recent Labs  Lab 12/07/23 1111 12/09/23 2008 12/11/23 0744  NA 141 142 141  K 4.6 4.3 4.1  CL 102 103 100  CO2 26 22 28   GLUCOSE 174* 179* 164*  BUN 65* 59* 43*  CREATININE 5.05* 4.81* 4.61*  CALCIUM 9.9 9.7 9.6  PHOS 5.4* 5.5* 5.8*   Liver Function Tests: Recent Labs  Lab 12/07/23 1111 12/09/23 2008 12/11/23 0744  ALBUMIN 3.7 3.6 3.5   CBC: Recent Labs  Lab 12/05/23 2037 12/05/23 2250 12/07/23 1111 12/09/23 2008  WBC 10.7* 11.0* 12.4* 11.5*  NEUTROABS 7.0 7.1  --  7.3  HGB 11.3* 10.9* 11.2* 10.5*  HCT 34.1* 33.9* 33.3* 31.2*  MCV 92.9 92.6 92.2 92.0  PLT 241 245 233 234   CBG: Recent Labs  Lab 12/10/23 1106 12/10/23 1650 12/10/23 2119 12/11/23 0643 12/11/23 1126  GLUCAP 224* 216* 167* 124* 175*    Studies/Results: MR BRAIN WO CONTRAST Result Date:  12/11/2023 CLINICAL DATA:  Provided history: Facial paralysis/weakness (CN 7). EXAM: MRI HEAD WITHOUT CONTRAST TECHNIQUE: Multiplanar, multiecho pulse sequences of the brain and surrounding structures were obtained without intravenous contrast. COMPARISON:  Brain MRI 11/26/2023. FINDINGS: Brain: Generalized parenchymal atrophy. 4 mm subacute infarct within the left periatrial white matter (series 2, image 25). In retrospect, this was present as an acute infarct on the prior brain MRI of 11/26/2023. Known small subacute infarct within the dorsal pons and middle cerebellar peduncle on the left. Tiny focus of T2 shine through again noted within the anterior pons just to the right of midline on the diffusion-weighted imaging. Known chronic cortical/subcortical right MCA territory infarct within the right parietal lobe. Associated chronic hemosiderin deposition at this site. Multiple chronic lacunar infarcts again noted within the pons. Background mild multifocal T2 FLAIR hyperintense signal abnormality within the cerebral white matter and pons, nonspecific but compatible with chronic small vessel ischemic disease. Small chronic infarcts again noted within the bilateral cerebellar hemispheres. 11 mm ovoid FLAIR hyperintense focus in the right paraclinoid region, likely reflecting a meningioma (series 6, image 16). In retrospect, this finding was present on the prior MRI. No extra-axial fluid collection. No midline shift. Vascular: Maintained flow voids within the proximal large arterial vessels.  Skull and upper cervical spine: No focal worrisome marrow lesion. Sinuses/Orbits: No mass or acute finding within the imaged orbits. Prior but ocular lens replacement. No significant paranasal sinus disease. IMPRESSION: 1. Subacute infarcts within the dorsal left pons/left middle cerebellar peduncle and left periatrial white matter as described. 2. Background parenchymal atrophy, chronic small vessel ischemic disease and chronic  infarcts as described. 3. Probable 11 mm paraclinoid meningioma on the right. Post-contrast MR imaging of the brain is recommended for further evaluation. Electronically Signed   By: Jackey Loge D.O.   On: 12/11/2023 14:32    Medications:   (feeding supplement) PROSource Plus  30 mL Oral BID BM   allopurinol  300 mg Oral Daily   amLODipine  5 mg Oral QHS   aspirin  81 mg Oral Daily   atorvastatin  80 mg Oral Daily   cephALEXin  250 mg Oral Daily   Chlorhexidine Gluconate Cloth  6 each Topical Q0600   Chlorhexidine Gluconate Cloth  6 each Topical Q0600   clopidogrel  75 mg Oral Daily   hydrALAZINE  50 mg Oral BID   insulin aspart  0-9 Units Subcutaneous TID WC   insulin aspart  20 Units Subcutaneous TID WC   insulin glargine-yfgn  63 Units Subcutaneous QHS   losartan  100 mg Oral Daily   metoprolol tartrate  50 mg Oral BID   multivitamin  1 tablet Oral QHS   sevelamer carbonate  800 mg Oral TID WC    Dialysis Orders: MWF - [facility] 3:45hr, 400/800, EDW 115kg, 2K/2.5Ca bath, LUE AVF, 15g - unclear VDRA, ESA, heparin doses.   Assessment/Plan: Debility: In CIR, therapy going well. S/p L pons CVA. Weakness + double vision. On Plavix/ASA/statin.  Worsened L sided facial droop - MRI pending  ESRD: Usual MWF schedule - for HD today.  HTN/volume: BP elevated, on amlodipine 5mg  every day, losartan 100mg , metoprolol 50mg  BID, hydralazine 50mg  BID.  Trace edema. Still urinates. Does not usually require any UF with HD.  Monitor closely. Anemia of ESRD: Hgb 10.5 - no ESA for now. Secondary HPTH: Ca/Phos ok - continue sevelamer as binder. Nutrition: Alb improving, on protein supp T2DM: Insulin, per primary. CAD, Hx CABG   Virgina Norfolk, PA-C [facility] 12/11/2023,2:47 PM  LOS: 9 days

## 2023-12-11 NOTE — Progress Notes (Signed)
MRI brain completed for indication of worsening Left facial droop, per report, no new infarcts localized.  Have asked neuro to review.

## 2023-12-12 DIAGNOSIS — I639 Cerebral infarction, unspecified: Secondary | ICD-10-CM | POA: Diagnosis not present

## 2023-12-12 LAB — GLUCOSE, CAPILLARY
Glucose-Capillary: 221 mg/dL — ABNORMAL HIGH (ref 70–99)
Glucose-Capillary: 154 mg/dL — ABNORMAL HIGH (ref 70–99)
Glucose-Capillary: 78 mg/dL (ref 70–99)
Glucose-Capillary: 271 mg/dL — ABNORMAL HIGH (ref 70–99)

## 2023-12-12 MED ORDER — ACETAMINOPHEN 325 MG PO TABS: 650.0000 mg | ORAL_TABLET | ORAL | Status: DC | PRN

## 2023-12-12 MED ORDER — ACYCLOVIR 200 MG PO CAPS
200.0000 mg | ORAL_CAPSULE | Freq: Two times a day (BID) | ORAL | Status: DC
  Administered 2023-12-12 – 2023-12-15 (×7): 200 mg via ORAL
  Filled 2023-12-12 (×7): qty 1

## 2023-12-12 MED ORDER — ARTIFICIAL TEARS OPHTHALMIC OINT
TOPICAL_OINTMENT | Freq: Every day | OPHTHALMIC | Status: DC
  Filled 2023-12-12: qty 3.5

## 2023-12-12 MED ORDER — PREDNISONE 20 MG PO TABS
60.0000 mg | ORAL_TABLET | Freq: Every day | ORAL | Status: DC
  Administered 2023-12-12 – 2023-12-15 (×4): 60 mg via ORAL
  Filled 2023-12-12 (×4): qty 3

## 2023-12-12 NOTE — Progress Notes (Signed)
[facility] Progress Note   Dialysis Orders: MWF - [facility] 3:45hr, 400/800, EDW 115kg, 2K/2.5Ca bath, LUE AVF, 15g - unclear VDRA, ESA, heparin doses.   Assessment/Plan: Debility: In CIR, therapy going well. S/p L pons CVA. Weakness + double vision. On Plavix/ASA/statin.  Worsened L sided facial droop - MRI pending  ESRD: Usual MWF schedule - HD tomorrow on holiday schedule STuFr.  HTN/volume: BP elevated, on amlodipine 5mg  every day, losartan 100mg , metoprolol 50mg  BID, hydralazine 50mg  BID.  Trace edema. Still urinates. Does not usually require any UF with HD.  Monitor closely. Anemia of ESRD: Hgb 10.5 - no ESA for now. Secondary HPTH: Ca/Phos ok - continue sevelamer as binder. Nutrition: Alb improving, on protein supp T2DM: Insulin, per primary. CAD, Hx CABG   Subjective:   Patient seen and examined at bedside in room. Headache better; no UF last dialysis treatment on Sunday. Also no drops in BP and 0UF during HD Friday.  Continues to have mild HA now;   MRI completed on Sunday with no acute findings, subacute infarcts of the dorsal left pons and left middle cerebellar peduncle as described previously.   Pt denies fever, chills, nausea, vomiting, myalgias, SOB, CP.   Objective Vitals:   12/11/23 1954 12/11/23 2055 12/12/23 0500 12/12/23 0512  BP:  (!) 135/48  (!) 144/42  Pulse:  71  63  Resp:  18  18  Temp: 98.4 F (36.9 C) 97.6 F (36.4 C)  97.6 F (36.4 C)  TempSrc: Oral   Oral  SpO2:  98%    Weight:   115 kg   Height:       Physical Exam General:chronically ill appearing female with L sided facial droop Heart:RRR Lungs:CTAB Abdomen:soft, NTND Extremities:trace LE edema Dialysis Access: LU BCF, bandages removed and hemostasis present, harsh bruit at the inflow  Filed Weights   12/11/23 0500 12/12/23 0500  Weight: 116 kg 115 kg    Intake/Output Summary (Last 24 hours) at 12/12/2023 0837 Last data filed at 12/12/2023 0743 Gross per  24 hour  Intake 220 ml  Output 0 ml  Net 220 ml    Additional Objective Labs: Basic Metabolic Panel: Recent Labs  Lab 12/07/23 1111 12/09/23 2008 12/11/23 0744  NA 141 142 141  K 4.6 4.3 4.1  CL 102 103 100  CO2 26 22 28   GLUCOSE 174* 179* 164*  BUN 65* 59* 43*  CREATININE 5.05* 4.81* 4.61*  CALCIUM 9.9 9.7 9.6  PHOS 5.4* 5.5* 5.8*   Liver Function Tests: Recent Labs  Lab 12/07/23 1111 12/09/23 2008 12/11/23 0744  ALBUMIN 3.7 3.6 3.5   CBC: Recent Labs  Lab 12/05/23 2037 12/05/23 2250 12/07/23 1111 12/09/23 2008  WBC 10.7* 11.0* 12.4* 11.5*  NEUTROABS 7.0 7.1  --  7.3  HGB 11.3* 10.9* 11.2* 10.5*  HCT 34.1* 33.9* 33.3* 31.2*  MCV 92.9 92.6 92.2 92.0  PLT 241 245 233 234   CBG: Recent Labs  Lab 12/10/23 2119 12/11/23 0643 12/11/23 1126 12/11/23 2054 12/12/23 0607  GLUCAP 167* 124* 175* 115* 221*    Studies/Results: MR BRAIN WO CONTRAST Result Date: 12/11/2023 CLINICAL DATA:  Provided history: Facial paralysis/weakness (CN 7). EXAM: MRI HEAD WITHOUT CONTRAST TECHNIQUE: Multiplanar, multiecho pulse sequences of the brain and surrounding structures were obtained without intravenous contrast. COMPARISON:  Brain MRI 11/26/2023. FINDINGS: Brain: Generalized parenchymal atrophy. 4 mm subacute infarct within the left periatrial white matter (series 2, image 25). In retrospect, this was present as an acute  infarct on the prior brain MRI of 11/26/2023. Known small subacute infarct within the dorsal pons and middle cerebellar peduncle on the left. Tiny focus of T2 shine through again noted within the anterior pons just to the right of midline on the diffusion-weighted imaging. Known chronic cortical/subcortical right MCA territory infarct within the right parietal lobe. Associated chronic hemosiderin deposition at this site. Multiple chronic lacunar infarcts again noted within the pons. Background mild multifocal T2 FLAIR hyperintense signal abnormality within the  cerebral white matter and pons, nonspecific but compatible with chronic small vessel ischemic disease. Small chronic infarcts again noted within the bilateral cerebellar hemispheres. 11 mm ovoid FLAIR hyperintense focus in the right paraclinoid region, likely reflecting a meningioma (series 6, image 16). In retrospect, this finding was present on the prior MRI. No extra-axial fluid collection. No midline shift. Vascular: Maintained flow voids within the proximal large arterial vessels. Skull and upper cervical spine: No focal worrisome marrow lesion. Sinuses/Orbits: No mass or acute finding within the imaged orbits. Prior but ocular lens replacement. No significant paranasal sinus disease. IMPRESSION: 1. Subacute infarcts within the dorsal left pons/left middle cerebellar peduncle and left periatrial white matter as described. 2. Background parenchymal atrophy, chronic small vessel ischemic disease and chronic infarcts as described. 3. Probable 11 mm paraclinoid meningioma on the right. Post-contrast MR imaging of the brain is recommended for further evaluation. Electronically Signed   By: Jackey Loge D.O.   On: 12/11/2023 14:32    Medications:   (feeding supplement) PROSource Plus  30 mL Oral BID BM   allopurinol  300 mg Oral Daily   amLODipine  5 mg Oral QHS   aspirin  81 mg Oral Daily   atorvastatin  80 mg Oral Daily   Chlorhexidine Gluconate Cloth  6 each Topical Q0600   Chlorhexidine Gluconate Cloth  6 each Topical Q0600   clopidogrel  75 mg Oral Daily   hydrALAZINE  50 mg Oral BID   insulin aspart  0-9 Units Subcutaneous TID WC   insulin aspart  20 Units Subcutaneous TID WC   insulin glargine-yfgn  63 Units Subcutaneous QHS   losartan  100 mg Oral Daily   metoprolol tartrate  50 mg Oral BID   multivitamin  1 tablet Oral QHS   sevelamer carbonate  800 mg Oral TID WC

## 2023-12-12 NOTE — Progress Notes (Signed)
Patient ID: Sharon Arias, female   DOB: January 18, 1961, 62 y.o.   MRN: [id number]  [facility] approved with [facility]. No DME recommendations currently.

## 2023-12-12 NOTE — Progress Notes (Signed)
Chart reviewed and contacted CSW to inquire if pt's d/c date changed to 12/26. Contacted [facility] and advised that clinic is treating MWF pts on Mon, Th, Sat this week. Pt's next out-pt treatment would be Saturday unless pt able to make it to clinic on Thursday after d/c if d/c date has been changed to 12/26. CSW and nephrologist made aware of this info in the event pt's d/c date has been changed. Awaiting response. Will assist as needed.   Olivia Canter Renal Navigator [phone]

## 2023-12-12 NOTE — Consult Note (Signed)
NEUROLOGY CONSULT NOTE   Date of service: December 12, 2023 Patient Name: Sharon Arias MRN:  [id number] DOB:  1960/12/24 Chief Complaint: "facial droop" Requesting Provider: Erick Colace, MD  History of Present Illness  MAGUIRE MEOLA is a 62 y.o. female  has a past medical history of Anemia in chronic kidney disease (05/19/2016), Anxiety, Aortic atherosclerosis (HCC), Blind, CAD (coronary artery disease), Cardiac murmur, CHF (congestive heart failure) (HCC), Chicken pox, Chronic anticoagulation, CVA (cerebral vascular accident) (HCC), DDD (degenerative disc disease), lumbar, Detached retina, Diabetic neuropathy (HCC), Diabetic retinopathy (HCC), Dyspnea, ESRD needing dialysis (HCC), CABG, Hyperlipidemia, Hypertension, NSTEMI (non-ST elevated myocardial infarction) (HCC) (06/2012), Obesity, Pneumonia, PONV (postoperative nausea and vomiting), Sciatica of right side, Sleep apnea, and T2DM (type 2 diabetes mellitus) (HCC). who initially was admitted to [facility] from 12/7-12/13 with symptoms of vertigo for 2 days and palsies of the 6th and 7th cranial nerves on the L 2/2 acute ischemic infarct in the L brachium pontis due to small vessel disease. Stroke workup was completed. She was is currently in inpatient rehab. She was HD when she felt like her facial droop became worse and she noticed a change how her mouth was moving. Repeat MRI was showed evolving subacute strokes with no new acute stroke.    ROS  Comprehensive ROS performed and pertinent positives documented in HPI   Past History   Past Medical History:  Diagnosis Date   Anemia in chronic kidney disease 05/19/2016   Anxiety    Aortic atherosclerosis (HCC)    Blind    CAD (coronary artery disease)    Cardiac murmur    CHF (congestive heart failure) (HCC)    G2DD on 02/2020 TTE   Chicken pox    Chronic anticoagulation    CVA (cerebral vascular accident) (HCC)    DDD (degenerative disc disease), lumbar    Detached retina     Diabetic neuropathy (HCC)    Diabetic retinopathy (HCC)    legally blind   Dyspnea    ESRD needing dialysis (HCC)    Hx of CABG    Hyperlipidemia    Hypertension    NSTEMI (non-ST elevated myocardial infarction) (HCC) 06/2012   Obesity    Pneumonia    PONV (postoperative nausea and vomiting)    Sciatica of right side    Sleep apnea    CPAP NIGHTLY   T2DM (type 2 diabetes mellitus) (HCC)     Past Surgical History:  Procedure Laterality Date   A/V FISTULAGRAM Left 04/21/2021   Procedure: A/V FISTULAGRAM;  Surgeon: Renford Dills, MD;  Location: [facility] INVASIVE CV LAB;  Service: Cardiovascular;  Laterality: Left;   A/V FISTULAGRAM Left 11/02/2022   Procedure: A/V Fistulagram;  Surgeon: Renford Dills, MD;  Location: [facility] INVASIVE CV LAB;  Service: Cardiovascular;  Laterality: Left;   AV FISTULA PLACEMENT Left 02/06/2021   Procedure: ARTERIOVENOUS (AV) FISTULA CREATION (BRACHIAL CEPHALIC );  Surgeon: Renford Dills, MD;  Location: [facility] ORS;  Service: Vascular;  Laterality: Left;   COLONOSCOPY WITH PROPOFOL N/A 01/26/2016   Procedure: COLONOSCOPY WITH PROPOFOL;  Surgeon: Christena Deem, MD;  Location: [facility] ENDOSCOPY;  Service: Endoscopy;  Laterality: N/A;   COLONOSCOPY WITH PROPOFOL N/A 01/27/2016   Procedure: COLONOSCOPY WITH PROPOFOL;  Surgeon: Christena Deem, MD;  Location: [facility] ENDOSCOPY;  Service: Endoscopy;  Laterality: N/A;   CORONARY ARTERY BYPASS GRAFT  2013   DIALYSIS/PERMA CATHETER INSERTION N/A 02/03/2021   Procedure: DIALYSIS/PERMA CATHETER INSERTION;  Surgeon: Renford Dills,  MD;  Location: [facility] INVASIVE CV LAB;  Service: Cardiovascular;  Laterality: N/A;   DIALYSIS/PERMA CATHETER INSERTION N/A 06/16/2021   Procedure: DIALYSIS/PERMA CATHETER INSERTION;  Surgeon: Annice Needy, MD;  Location: [facility] INVASIVE CV LAB;  Service: Cardiovascular;  Laterality: N/A;   DIALYSIS/PERMA CATHETER REMOVAL N/A 09/22/2021   Procedure: DIALYSIS/PERMA CATHETER REMOVAL;  Surgeon:  Renford Dills, MD;  Location: [facility] INVASIVE CV LAB;  Service: Cardiovascular;  Laterality: N/A;   INCISION AND DRAINAGE     chest abscess   INCISION AND DRAINAGE ABSCESS N/A 12/11/2018   Procedure: INCISION AND DRAINAGE PERINEAL;  Surgeon: Leafy Ro, MD;  Location: [facility] ORS;  Service: General;  Laterality: N/A;   RETINAL LASER PROCEDURE      Family History: Family History  Problem Relation Age of Onset   Breast cancer Mother    Prostate cancer Father        we think mets to liver and bone    Social History  reports that she has never smoked. She has never been exposed to tobacco smoke. She has never used smokeless tobacco. She reports that she does not drink alcohol and does not use drugs.  Allergies  Allergen Reactions   Ozempic (0.25 Or 0.5 Mg-Dose) [Semaglutide(0.25 Or 0.5mg -Dos)] Diarrhea and Nausea Only   Trulicity [Dulaglutide] Diarrhea and Nausea Only   Other     Anesthesia--nausea/vomiting    Medications   Current Facility-Administered Medications:    (feeding supplement) PROSource Plus liquid 30 mL, 30 mL, Oral, BID BM, Angiulli, Mcarthur Rossetti, PA-C, 30 mL at 12/06/23 0803   acetaminophen (TYLENOL) tablet 650 mg, 650 mg, Oral, Q4H PRN, 650 mg at 12/10/23 0832 **OR** acetaminophen (TYLENOL) 160 MG/5ML solution 650 mg, 650 mg, Per Tube, Q4H PRN **OR** acetaminophen (TYLENOL) suppository 650 mg, 650 mg, Rectal, Q4H PRN, Angiulli, Mcarthur Rossetti, PA-C   allopurinol (ZYLOPRIM) tablet 300 mg, 300 mg, Oral, Daily, Angiulli, Mcarthur Rossetti, PA-C, 300 mg at 12/12/23 0818   amLODipine (NORVASC) tablet 5 mg, 5 mg, Oral, QHS, Angiulli, Mcarthur Rossetti, PA-C, 5 mg at 12/11/23 2148   aspirin chewable tablet 81 mg, 81 mg, Oral, Daily, Angiulli, Mcarthur Rossetti, PA-C, 81 mg at 12/12/23 0818   atorvastatin (LIPITOR) tablet 80 mg, 80 mg, Oral, Daily, Angiulli, Mcarthur Rossetti, PA-C, 80 mg at 12/12/23 0818   Chlorhexidine Gluconate Cloth 2 % PADS 6 each, 6 each, Topical, Q0600, Julien Nordmann, PA-C, 6 each at  12/10/23 2536   Chlorhexidine Gluconate Cloth 2 % PADS 6 each, 6 each, Topical, Q0600, Penninger, Lillia Abed, PA   clopidogrel (PLAVIX) tablet 75 mg, 75 mg, Oral, Daily, Angiulli, Mcarthur Rossetti, PA-C, 75 mg at 12/12/23 6440   furosemide (LASIX) tablet 40 mg, 40 mg, Oral, Daily PRN, Kirsteins, Victorino Sparrow, MD   hydrALAZINE (APRESOLINE) tablet 50 mg, 50 mg, Oral, BID, Angiulli, Mcarthur Rossetti, PA-C, 50 mg at 12/12/23 0818   insulin aspart (novoLOG) injection 0-9 Units, 0-9 Units, Subcutaneous, TID WC, Angiulli, Mcarthur Rossetti, PA-C, 3 Units at 12/12/23 0819   insulin aspart (novoLOG) injection 20 Units, 20 Units, Subcutaneous, TID WC, Angiulli, Mcarthur Rossetti, PA-C, 20 Units at 12/12/23 0819   insulin glargine-yfgn (SEMGLEE) injection 63 Units, 63 Units, Subcutaneous, QHS, Lovorn, Megan, MD, 40 Units at 12/11/23 2149   losartan (COZAAR) tablet 100 mg, 100 mg, Oral, Daily, Angiulli, Mcarthur Rossetti, PA-C, 100 mg at 12/12/23 0818   meclizine (ANTIVERT) tablet 25 mg, 25 mg, Oral, TID PRN, Angiulli, Mcarthur Rossetti, PA-C   metoprolol tartrate (LOPRESSOR) tablet 50  mg, 50 mg, Oral, BID, Angiulli, Mcarthur Rossetti, PA-C, 50 mg at 12/12/23 8657   multivitamin (RENA-VIT) tablet 1 tablet, 1 tablet, Oral, QHS, Angiulli, Mcarthur Rossetti, PA-C, 1 tablet at 12/11/23 2148   polyethylene glycol (MIRALAX / GLYCOLAX) packet 17 g, 17 g, Oral, Daily PRN, Modena Slater, DO   polyvinyl alcohol (LIQUIFILM TEARS) 1.4 % ophthalmic solution 1 drop, 1 drop, Both Eyes, PRN, Modena Slater, DO, 1 drop at 12/08/23 1428   sevelamer carbonate (RENVELA) tablet 800 mg, 800 mg, Oral, TID WC, Angiulli, Mcarthur Rossetti, PA-C, 800 mg at 12/12/23 0818  Vitals   Vitals:   12/11/23 1954 12/11/23 2055 12/12/23 0500 12/12/23 0512  BP:  (!) 135/48  (!) 144/42  Pulse:  71  63  Resp:  18  18  Temp: 98.4 F (36.9 C) 97.6 F (36.4 C)  97.6 F (36.4 C)  TempSrc: Oral   Oral  SpO2:  98%    Weight:   115 kg   Height:        Body mass index is 39.71 kg/m.  Physical Exam   Constitutional: Appears  well-developed and well-nourished.  Psych: Affect appropriate to situation.  Eyes: No scleral injection.  HENT: No OP obstruction.  Head: Normocephalic.  Cardiovascular: Normal rate and regular rhythm.  Respiratory: Effort normal, non-labored breathing.  GI: Soft.  No distension. There is no tenderness.  Skin: WDI.   Neurologic Examination   Neuro: Mental Status: Patient is awake, alert, oriented to person, place, month, year, and situation. Patient is able to give a clear and coherent history. No signs of aphasia or neglect. Mild dysarthria in the context of left facial droop.  Cranial Nerves: II: Severe visual acuity loss bilaterally. Pupils are equal, round, and reactive to light.   III,IV, VI: EOMI, No longer using eye patch V: Facial sensation is symmetric to temperature VII: Left upper and lower facial droop with weak blink strength on the left. Barely able to close left eye fully.  VIII: Hearing is intact to voice X: Palate elevates symmetrically XI: Shoulder shrug is symmetric. XII: Tongue protrudes midline without atrophy or fasciculations.  Motor: Tone is normal. Bulk is normal. 5/5 strength was present in all four extremities.  Sensory: Sensation is symmetric to light touch and temperature in the arms and legs. No extinction to DSS present.  Cerebellar: FNF and HKS are intact bilaterally   Labs/Imaging/Neurodiagnostic studies   CBC:  Recent Labs  Lab 12/30/2023 2250 12/07/23 1111 12/09/23 2008  WBC 11.0* 12.4* 11.5*  NEUTROABS 7.1  --  7.3  HGB 10.9* 11.2* 10.5*  HCT 33.9* 33.3* 31.2*  MCV 92.6 92.2 92.0  PLT 245 233 234    Basic Metabolic Panel:  Lab Results  Component Value Date   NA 141 12/11/2023   K 4.1 12/11/2023   CO2 28 12/11/2023   GLUCOSE 164 (H) 12/11/2023   BUN 43 (H) 12/11/2023   CREATININE 4.61 (H) 12/11/2023   CALCIUM 9.6 12/11/2023   GFRNONAA 10 (L) 12/11/2023   GFRAA 19 (L) 09/12/2020    Lipid Panel:  Lab Results  Component  Value Date   LDLCALC 66 11/27/2023    HgbA1c:  Lab Results  Component Value Date   HGBA1C 8.1 (H) 11/26/2023    Urine Drug Screen:     Component Value Date/Time   LABOPIA NONE DETECTED 11/26/2023 1616   COCAINSCRNUR NONE DETECTED 11/26/2023 1616   LABBENZ NONE DETECTED 11/26/2023 1616   AMPHETMU NONE DETECTED 11/26/2023 1616   THCU  NONE DETECTED 11/26/2023 1616   LABBARB NONE DETECTED 11/26/2023 1616     Alcohol Level No results found for: "ETH"  INR  Lab Results  Component Value Date   INR 1.3 (H) 11/26/2023    APTT  Lab Results  Component Value Date   APTT 32 11/26/2023    MRI Brain 12/7 Acute punctate infarct in the brachium pontis on the left. 2. Severe narrowing in the ophthalmic segment of the right ICA. 3. The aortic arch, common carotid arteries, proximal internal carotid arteries, and V1 segments of bilateral vertebral arteries are poorly visualized and incompletely assessed due to the degree of respiratory motion artifact. The mid to distal segments of the carotid and vertebral arteries are normal in appearance.  MRI Brain(Personally reviewed): 12/22 1. Subacute infarcts within the dorsal left pons/left middle cerebellar peduncle and left periatrial white matter as described. 2. Background parenchymal atrophy, chronic small vessel ischemic disease and chronic infarcts as described. 3. Probable 11 mm paraclinoid meningioma on the right. Post-contrast MR imaging of the brain is recommended for further evaluation.   ASSESSMENT   Sharon Arias is a 62 y.o. female  has a past medical history of Anemia in chronic kidney disease (05/19/2016), Anxiety, Aortic atherosclerosis (HCC), Blind, CAD (coronary artery disease), Cardiac murmur, CHF (congestive heart failure) (HCC), Chicken pox, Chronic anticoagulation, CVA (cerebral vascular accident) (HCC), DDD (degenerative disc disease), lumbar, Detached retina, Diabetic neuropathy (HCC), Diabetic retinopathy (HCC),  Dyspnea, ESRD needing dialysis (HCC), CABG, Hyperlipidemia, Hypertension, NSTEMI (non-ST elevated myocardial infarction) (HCC) (06/2012), Obesity, Pneumonia, PONV (postoperative nausea and vomiting), Sciatica of right side, Sleep apnea, and T2DM (type 2 diabetes mellitus) (HCC). admitted to [facility] from 12/7-12/13 with symptoms of vertigo for 2 days and palsies of the 6th and 7th cranial nerves on the L 2/2 acute ischemic infarct in the L brachium pontis due to small vessel disease. Stroke workup was completed. She is currently in inpatient rehab. She was at HD when she felt like her facial droop became worse and she noticed a change how her mouth was moving. Repeat MRI was showed evolving subacute strokes with no new acute stroke. No lesions on MRI to account for her central 7th. Given worsening facial droop with upper and lower facial involvement it would be reasonable to treat for Bell's Palsy as well.   RECOMMENDATIONS  - Acyclovir 200mg  BID x10 days for renal dosing- verified with pharmacy - Prednisone 60mg  x7 days  - Eye drops to left eye PRN - Tape left eyelid shut at night to prevent corneal abrasion while sleeping - Neurohospitalist service will follow PRN. Please call if there are additional questions.   ______________________________________________________________________   [facility]  Patient seen and examined by NP/APP with MD.  Elmer Picker, DNP, FNP-BC [facility] Pager: [phone]  The patient has been seen and examined by the Neurology attending. I have formulated the assessment and recommendations. The patient was initially admitted to [facility] from 12[facility]-12/13 with symptoms of vertigo for 2 days and palsies of the 6th and 7th cranial nerves on the left thought at that time to be due to acute ischemic infarct in the L brachium pontis due to small vessel disease. Stroke workup was completed. She is currently in inpatient rehab. She was at HD when she felt  like her facial droop became worse and she noticed a change how her mouth was moving. Repeat MRI was showed evolving subacute strokes with no new acute stroke. No lesions on MRI to account for  her central 7th. Given worsening facial droop with upper and lower facial involvement it would be reasonable to treat for Bell's Palsy as well. Exam as documented above.  Electronically signed: Dr. Caryl Pina

## 2023-12-12 NOTE — Progress Notes (Signed)
Occupational Therapy Session Note  Patient Details  Name: Sharon Arias MRN: [id number] Date of Birth: [birth year]  Today's Date: 12/12/2023 OT Individual Time: 1300-1400 OT Individual Time Calculation (min): 60 min    Short Term Goals: Week 2:  OT Short Term Goal 1 (Week 2): STG=LTG d/t ELOS  Skilled Therapeutic Interventions/Progress Updates:     Pt received sitting up in bed presenting to be in good spirits receptive to skilled OT session reporting 0/10 pain- OT offering intermittent rest breaks, repositioning, and therapeutic support to optimize participation in therapy session. Pt reporting decreased dizziness this session stating "the symptoms are manageable now". Pt requesting to take shower. Set-up environment to simulate home set-up. Engaged Pt in completing functional mobility throughout her room using rollator navigating to drawers and the closet to retrieve desired clothing with distant supervision provided for safety. Pt ambulated into bathroom using rollator and transferred to standard toilet with distant supervision. Pt doffed clothing while seated on toilet no LOB noted. Increased time required on toilet for continent void-documented in flowsheets. Pt reporting she has required 5-10 minutes during toileting to have a void since her CVA with education provided on neurogenic bladder symptoms and recovery process- Pt receptive to education. Pt transferred to TTB positioned in walk-in shower with supervision using rollator and grab bar. Once seated in shower, Pt able to complete U/LB bathing tasks mod I using long handled sponge to wash feet, standing briefly while holding onto grab bars to wash bottom, and providing self increased time and rest breaks for energy conservation. Following shower, Pt able to dry self seated on TTB mod I with increased time for rest breaks provided. Pt donned shirt mod I and pants with close supervision provided for safety during dynamic standing balance. Pt  completed functional mobility from bathroom to recliner using rollator with supervision. Completed grooming/hygiene tasks seated in recliner mod I- retrieved items from sink during ambulation. Pt fatigued at end of session, however reporting no increase in dizziness symptoms. Pt was left resting in recliner with call bell in reach, chair alarm on, and all needs met.    Therapy Documentation Precautions:  Precautions Precautions: Fall, Posterior Hip Precaution Comments: low vision, vertical diplopia, spinning sensation with mobility and at rest Restrictions Weight Bearing Restrictions Per Provider Order: No  Therapy/Group: Individual Therapy  Clide Deutscher 12/12/2023, 1:19 PM

## 2023-12-12 NOTE — Progress Notes (Signed)
PROGRESS NOTE   Subjective/Complaints:  MRI brain yesterday for worsening left facial droop, per report subacute infarcts of the dorsal left pons/middle cerebellar peduncle and left parietal white matter, along with chronic small vessel ischemic changes and probable 11 mm paraclinoid meningioma on the right.  No new infarcts per report.  Diastolic hypotension overnight in the 40s, otherwise vital stable.  Labs with dialysis yesterday, without significant changes.  No reported events overnight.  Patient this a.m. stating that she merely feels "wiped out" 24 hours after dialysis, and is concerned about not being able to participate well in therapies because of this baseline reaction.  She states her facial droop remains severe starting on Friday, has not worsened but she is getting a lot of difficulty closing her left eye.  No associated pain or perioral numbness.  ROS:  + Left facial droop, generalized fatigue Patient denies any shortness of breath, chest pain, or leg swelling.  Patient does endorse some dizziness.   Objective:   MR BRAIN WO CONTRAST Result Date: 12/11/2023 CLINICAL DATA:  Provided history: Facial paralysis/weakness (CN 7). EXAM: MRI HEAD WITHOUT CONTRAST TECHNIQUE: Multiplanar, multiecho pulse sequences of the brain and surrounding structures were obtained without intravenous contrast. COMPARISON:  Brain MRI 11/26/2023. FINDINGS: Brain: Generalized parenchymal atrophy. 4 mm subacute infarct within the left periatrial white matter (series 2, image 25). In retrospect, this was present as an acute infarct on the prior brain MRI of 11/26/2023. Known small subacute infarct within the dorsal pons and middle cerebellar peduncle on the left. Tiny focus of T2 shine through again noted within the anterior pons just to the right of midline on the diffusion-weighted imaging. Known chronic cortical/subcortical right MCA territory  infarct within the right parietal lobe. Associated chronic hemosiderin deposition at this site. Multiple chronic lacunar infarcts again noted within the pons. Background mild multifocal T2 FLAIR hyperintense signal abnormality within the cerebral white matter and pons, nonspecific but compatible with chronic small vessel ischemic disease. Small chronic infarcts again noted within the bilateral cerebellar hemispheres. 11 mm ovoid FLAIR hyperintense focus in the right paraclinoid region, likely reflecting a meningioma (series 6, image 16). In retrospect, this finding was present on the prior MRI. No extra-axial fluid collection. No midline shift. Vascular: Maintained flow voids within the proximal large arterial vessels. Skull and upper cervical spine: No focal worrisome marrow lesion. Sinuses/Orbits: No mass or acute finding within the imaged orbits. Prior but ocular lens replacement. No significant paranasal sinus disease. IMPRESSION: 1. Subacute infarcts within the dorsal left pons/left middle cerebellar peduncle and left periatrial white matter as described. 2. Background parenchymal atrophy, chronic small vessel ischemic disease and chronic infarcts as described. 3. Probable 11 mm paraclinoid meningioma on the right. Post-contrast MR imaging of the brain is recommended for further evaluation. Electronically Signed   By: Jackey Loge D.O.   On: 12/11/2023 14:32   Recent Labs    12/09/23 2008  WBC 11.5*  HGB 10.5*  HCT 31.2*  PLT 234   Recent Labs    12/09/23 2008 12/11/23 0744  NA 142 141  K 4.3 4.1  CL 103 100  CO2 22 28  GLUCOSE 179* 164*  BUN  59* 43*  CREATININE 4.81* 4.61*  CALCIUM 9.7 9.6    Intake/Output Summary (Last 24 hours) at 12/12/2023 0749 Last data filed at 12/12/2023 0743 Gross per 24 hour  Intake 220 ml  Output 0 ml  Net 220 ml        Physical Exam: Vital Signs Blood pressure (!) 144/42, pulse 63, temperature 97.6 F (36.4 C), temperature source Oral, resp. rate  18, height 5\' 7"  (1.702 m), weight 115 kg, SpO2 98%.     General: No acute distress.  Sitting up at bedside.+ Obese Mood and affect are appropriate Heart: Regular rate and rhythm no rubs murmurs or extra sounds Lungs: Clear to auscultation, breathing unlabored, no rales or wheezes Abdomen: Positive bowel sounds, soft nontender to palpation, nondistended Extremities: No clubbing, cyanosis, or edema Skin: No evidence of breakdown, no evidence of rash Neurologic:  Awake, alert, and oriented x 4.  Mild dysarthria, otherwise no apparent aphasia, cognitively intact.  Cranial nerves: Left lower eyelid droop, unable to fully close eye.  Left facial droop.  Otherwise, intact. motor strength is 5/5 in bilateral deltoid, bicep, tricep, grip, hip flexor, knee extensors, ankle dorsiflexor and plantar flexor-unchanged from prior exams 12-23 Sensory exam normal sensation to light touch and proprioception in bilateral upper and lower extremities-unchanged 12-23 Cerebellar exam normal finger to nose to finger as well as heel to shin in bilateral upper and lower extremities -finger-to-nose exam normal 12-23 Musculoskeletal: Full range of motion in all 4 extremities. No joint swelling       Assessment/Plan: 1. Functional deficits which require 3+ hours per day of interdisciplinary therapy in a comprehensive inpatient rehab setting. Physiatrist is providing close team supervision and 24 hour management of active medical problems listed below. Physiatrist and rehab team continue to assess barriers to discharge/monitor patient progress toward functional and medical goals  Care Tool:  Bathing    Body parts bathed by patient: Right arm, Left upper leg, Right lower leg, Left arm, Chest, Left lower leg, Abdomen, Face, Front perineal area, Buttocks, Right upper leg         Bathing assist Assist Level: Contact Guard/Touching assist     Upper Body Dressing/Undressing Upper body dressing   What is the  patient wearing?: Pull over shirt    Upper body assist Assist Level: Set up assist    Lower Body Dressing/Undressing Lower body dressing      What is the patient wearing?: Underwear/pull up, Pants     Lower body assist Assist for lower body dressing: Moderate Assistance - Patient 50 - 74%     Toileting Toileting    Toileting assist Assist for toileting: Contact Guard/Touching assist     Transfers Chair/bed transfer  Transfers assist     Chair/bed transfer assist level: Minimal Assistance - Patient > 75%     Locomotion Ambulation   Ambulation assist      Assist level: Minimal Assistance - Patient > 75% Assistive device: Rollator Max distance: 252ft   Walk 10 feet activity   Assist     Assist level: Contact Guard/Touching assist Assistive device: Rollator   Walk 50 feet activity   Assist    Assist level: Minimal Assistance - Patient > 75% Assistive device: Rollator    Walk 150 feet activity   Assist Walk 150 feet activity did not occur: Safety/medical concerns  Assist level: Minimal Assistance - Patient > 75% Assistive device: Rollator    Walk 10 feet on uneven surface  activity   Assist  Assist level: Minimal Assistance - Patient > 75% Assistive device: Hand held assist, Other (comment) (railing)   Wheelchair     Assist Is the patient using a wheelchair?: Yes (to/from gym for energy conservation - uses an electric scooter for longer distances baseline) Type of Wheelchair: Manual    Wheelchair assist level: Dependent - Patient 0%      Wheelchair 50 feet with 2 turns activity    Assist        Assist Level: Dependent - Patient 0%   Wheelchair 150 feet activity     Assist      Assist Level: Dependent - Patient 0%   Blood pressure (!) 144/42, pulse 63, temperature 97.6 F (36.4 C), temperature source Oral, resp. rate 18, height 5\' 7"  (1.702 m), weight 115 kg, SpO2 98%.  Medical Problem List and Plan: 1.  Functional deficits secondary to ischemic infarct left brachium pontis secondary to small vessel disease Increased facial droop, discussed with Vascular neuro who rec MRI , to see if CVA extension , symptoms are limited to anatomic area of previous infarct and are limited to facial droop, differential includes Bell's Palsy             -patient may  shower             -ELOS/Goals: 10-14 days, supervision PT, supervision OT, modified independent SLP              - Stable for admission to IRF  12-23: Patient inquiring about early discharge due to extreme fatigue after dialysis sessions; discussed 15/7 and she does not think this would be helpful..  To discussed with teams this week, did let therapy team know and they are in general agreement.    2.  Antithrombotics: -DVT/anticoagulation:  Pharmaceutical: Heparin             -antiplatelet therapy: Aspirin 81 mg daily and Plavix 75 mg daily as prior to admission 3. Pain Management: Tylenol as needed  12/14- denies pain- con't tlyenol prn  4. Mood/Behavior/Sleep: Provide emotional support             -antipsychotic agents: N/A 5. Neuropsych/cognition: This patient is capable of making decisions on her own behalf. 6. Skin/Wound Care: Routine skin checks 7. Fluids/Electrolytes/Nutrition: Routine in and outs with follow-up chemistries  12/23: Labs stable 8.  End-stage renal disease.  HD today.  Continue HD per nephrology 9.  CAD with CABG 2013.  No chest pain or shortness of breath.  Continue aspirin and Plavix 10.  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  Hemoglobin A1c 8.1. Novolog 20 units 3 times daily with meals. Semglee 63 units nightly.  Blood glucose does continue to measure well, did have fasting of 90 today, we will keep an eye on this.  If need to, can decrease insulin.  12-23: Blood sugars relatively well-controlled.  Continue current regimen.   CBG (last 3)  Recent Labs    12/11/23 1126 12/11/23 2054 12/12/23 0607   GLUCAP 175* 115* 221*  Fair control   11.  Hypertension.  Cozaar 100 mg daily, hydralazine 50 mg twice daily, Norvasc 5 mg daily, Lopressor 50 mg twice daily.  Scopolamine patch was removed, blood pressure did elevate into the 150s.  Given patient having dialysis today, will hold off on increasing antihypertensive medications at this time.   Vitals:   12/11/23 2055 12/12/23 0512  BP: (!) 135/48 (!) 144/42  Pulse: 71 63  Resp: 18 18  Temp: 97.6  F (36.4 C) 97.6 F (36.4 C)  SpO2: 98%   12/23: Diastolic hypotension, likely due to dialysis yesterday.  Remains asymptomatic, no medication changes.  12.  Hyperlipidemia.  Lipitor 13.  Diastolic congestive heart failure.  Lasix 40 mg Tuesday Thursday Saturday Sunday.  No signs of volume overload. 14.  History of gout.  Zyloprim 300 mg daily.  Monitor for any gout flares, no concern for gout flare at this time. 15.Obesity.  BMI 39.50.  Dietary follow-up 16.  Anemia of chronic disease.  No acute concerns at this time.  Hemoglobin slightly decreased, but no concerns for bleeding.  17. Constipation, no bowel movement yesterday, this makes 2 days with no bowel movement.  If need be, can give patient stool softener.  Will add on as needed.  - Last bowel movement 12-21, not viewed.  18.  Diabetic retinopathy f/u optho   19.  Dizziness: Likely secondary to stroke.  Dizziness is improving slowly.   Will continue with therapy.  20.  Acute Cystitis: UA showing evidence of pyuria.  Today 4 of 5 of cephalexin  21.  Left facial droop -likely Bell's palsy.  -MRI brain 12-22 without new lesions/changes.  12-23: No improvement since Friday, neurology consulted, agree with tentative diagnosis of Bell's palsy and starting acyclovir and prednisone-appreciate their assistance and recommendations.   - Adding ophthalmic ointment for left eye nightly.  Inquiring into nursing taping for lower eyelid.  LOS: 10 days A FACE TO FACE EVALUATION WAS  PERFORMED  Angelina Sheriff 12/12/2023, 7:49 AM  Internal medicine Resident PGY-2

## 2023-12-12 NOTE — Progress Notes (Signed)
Occupational Therapy Session Note  Patient Details  Name: Sharon Arias MRN: [id number] Date of Birth: 1960/12/24  Today's Date: 12/12/2023 OT Individual Time: 6962-9528 OT Individual Time Calculation (min): 73 min    Short Term Goals: Week 2:  OT Short Term Goal 1 (Week 2): STG=LTG d/t ELOS  Skilled Therapeutic Interventions/Progress Updates:      Therapy Documentation Precautions:  Precautions Precautions: Fall, Posterior Hip Precaution Comments: low vision, vertical diplopia, spinning sensation with mobility and at rest Restrictions Weight Bearing Restrictions Per Provider Order: No General: "I can't wait to go home!" Pt seated in recliner upon OT arrival, agreeable to OT.  Pain: no pain reported  Exercises: Pt issued UE theraband HEP in order to increase functional strength, andurance and activity tolerance in order to increase independence in ADLs such as bathing. Pt issued yellow theraband and discussed direction/technique of exercises, demonstrating verbal understanding. Pt completed 3x10 exercises at bed level listed below: -elbow extensions - shoulder horizontal abduction -bicep curls  -shoulder flexion -diagonal shoulder flexion -external rotation   Other Treatments: OT provided therapeutic listening to pt's recent situation of facial droop. OT educating pt on potential outpatient strategies to aide in healing of facial droop. Pt reports son also is going through same situation. Pt and OT discussed D/C and practicing energy conservation strategies d/t decreased energy on days of dialysis and up to 24hrs after. Pt receptive to information.    Pt seated in recliner at end of session with W/C alarm donned, call light within reach and 4Ps assessed.    Therapy/Group: Individual Therapy  Velia Meyer, OTD, OTR/L 12/12/2023, 5:48 PM

## 2023-12-12 NOTE — Progress Notes (Signed)
Patient ID: Sharon Arias, female   DOB: 28-Oct-1961, 62 y.o.   MRN: [id number]   Patient moved to 12/26 per covering physician.

## 2023-12-12 NOTE — Progress Notes (Signed)
Physical Therapy Session Note  Patient Details  Name: Sharon Arias MRN: [id number] Date of Birth: 1961/09/12  Today's Date: 12/12/2023 PT Individual Time: 0900-0940 PT Individual Time Calculation (min): 40 min   Short Term Goals: Week 2:  PT Short Term Goal 1 (Week 2): = to LTGs based on ELOS   Skilled Therapeutic Interventions/Progress Updates:    Pt agreeable to therapy stating she is fatigued from HD yesterday but wants to attempt as much as she can. Focused on functional gait with rollator to therapy gym x150' without rest break needed on the way there, safe use of AD supervision to modified independent. Focused on stair negotiation for home entry and access to publick transportation for HD. Pt performed 8 steps with combination of single and bilateral rails on 6" steps with supervision overall. Confirmation cues for safety due to visual impairments but no hands on assist needed. Pt fatigued following this and requires extended rest break. Pt noted to have increased dizziness (vestibular presentation) requiring more time to recover which she reports she experiences in correlation post HD. Discussed home environment set up and awareness of her own limitations during rest break. Provided handout for HEP (see below) for functional strengthening and balance exercises. Reviewed with pt and verbalized understanding. Pt stating she just doesn't feel like she can do anymore this morning due to fatigue, so session ended early. Pt able to return back to room x 150' with supervision due to increased dizziness and fatigue, otherwise modified independent throughout session.     Access Code: [email or web address] URL: [email or web address] Date: 12/12/2023 Prepared by: Jill Side  Exercises - Supine Bridge  - 1 x daily - 7 x weekly - 3 sets - 10 reps - Supine Lower Trunk Rotation  - 1 x daily - 7 x weekly - 3 sets - 10 reps - Hooklying Single Knee to Chest Stretch  - 1 x daily - 7 x weekly - 3 sets -  10 reps - Standing Heel Raise with Support  - 1 x daily - 7 x weekly - 3 sets - 10 reps - 5-10 sec hold - Standing Toe Raises at Chair  - 1 x daily - 7 x weekly - 3 sets - 10 reps - Mini Squat with Counter Support  - 1 x daily - 7 x weekly - 3 sets - 10 reps - Standing Tandem Balance with Counter Support  - 1 x daily - 7 x weekly - 3 sets - 10 reps - 10-20 sec hold Therapy Documentation Precautions:  Precautions Precautions: Fall, Posterior Hip Precaution Comments: low vision, vertical diplopia, spinning sensation with mobility and at rest Restrictions Weight Bearing Restrictions Per Provider Order: No General: PT Amount of Missed Time (min): 20 Minutes PT Missed Treatment Reason: Patient fatigue   Pain: Pain Assessment Pain Scale: 0-10 Pain Score: 0-No pain    Therapy/Group: Individual Therapy  Karolee Stamps Darrol Poke, PT, DPT, CBIS  12/12/2023, 9:54 AM

## 2023-12-13 LAB — GLUCOSE, CAPILLARY
Glucose-Capillary: 393 mg/dL — ABNORMAL HIGH (ref 70–99)
Glucose-Capillary: 204 mg/dL — ABNORMAL HIGH (ref 70–99)
Glucose-Capillary: 196 mg/dL — ABNORMAL HIGH (ref 70–99)
Glucose-Capillary: 312 mg/dL — ABNORMAL HIGH (ref 70–99)

## 2023-12-13 LAB — CBC
HCT: 31.1 % — ABNORMAL LOW (ref 36.0–46.0)
Hemoglobin: 10.4 g/dL — ABNORMAL LOW (ref 12.0–15.0)
MCH: 30.8 pg (ref 26.0–34.0)
MCHC: 33.4 g/dL (ref 30.0–36.0)
MCV: 92 fL (ref 80.0–100.0)
Platelets: 258 10*3/uL (ref 150–400)
RBC: 3.38 MIL/uL — ABNORMAL LOW (ref 3.87–5.11)
RDW: 14.1 % (ref 11.5–15.5)
WBC: 16.6 10*3/uL — ABNORMAL HIGH (ref 4.0–10.5)
nRBC: 0 % (ref 0.0–0.2)

## 2023-12-13 LAB — RENAL FUNCTION PANEL
Albumin: 3.9 g/dL (ref 3.5–5.0)
Anion gap: 17 — ABNORMAL HIGH (ref 5–15)
BUN: 59 mg/dL — ABNORMAL HIGH (ref 8–23)
CO2: 22 mmol/L (ref 22–32)
Calcium: 10 mg/dL (ref 8.9–10.3)
Chloride: 97 mmol/L — ABNORMAL LOW (ref 98–111)
Creatinine, Ser: 5.02 mg/dL — ABNORMAL HIGH (ref 0.44–1.00)
GFR, Estimated: 9 mL/min — ABNORMAL LOW (ref >60)
Glucose, Bld: 272 mg/dL — ABNORMAL HIGH (ref 70–99)
Phosphorus: 4 mg/dL (ref 2.5–4.6)
Potassium: 4.7 mmol/L (ref 3.5–5.1)
Sodium: 136 mmol/L (ref 135–145)

## 2023-12-13 NOTE — Progress Notes (Signed)
   12/13/23 1130  What Happened  Was fall witnessed? Yes  Who witnessed fall? Lawson Fiscal, OT  Patients activity before fall during therapy  Point of contact buttocks  Was patient injured? No  Provider Notification  Provider Name/Title Deatra Ina, PA-C  Date Provider Notified 12/13/23  Time Provider Notified 1642  Method of Notification Call  Notification Reason Fall  Provider response No new orders  Date of Provider Response 12/13/23  Time of Provider Response 1642  Follow Up  Family notified No - patient refusal  Additional tests No  Progress note created (see row info) Yes  Adult Fall Risk Assessment  Risk Factor Category (scoring not indicated) Fall has occurred during this admission (document High fall risk)  Patient Fall Risk Level High fall risk  Adult Fall Risk Interventions  Required Bundle Interventions *See Row Information* High fall risk - low, moderate, and high requirements implemented  Additional Interventions Lap belt while in chair/wheelchair (Rehab only);Use of appropriate toileting equipment (bedpan, BSC, etc.)  Fall intervention(s) refused/Patient educated regarding refusal Bed alarm;Nonskid socks;Open door if unsupervised;Yellow bracelet;Supervision while toileting/edge of bed sitting  Screening for Fall Injury Risk (To be completed on HIGH fall risk patients) - Assessing Need for Floor Mats  Risk For Fall Injury- Criteria for Floor Mats Previous fall this admission  Pain Assessment  Pain Scale 0-10  Pain Score 0  Neurological  Neuro (WDL) WDL  Level of Consciousness Alert  Orientation Level Oriented X4  Cognition Appropriate at baseline  Integumentary  Integumentary (WDL) WDL

## 2023-12-13 NOTE — Plan of Care (Signed)
  Problem: Sit to Stand Goal: LTG:  Patient will perform sit to stand in prep for activites of daily living with assistance level (OT) Description: LTG:  Patient will perform sit to stand in prep for activites of daily living with assistance level (OT) Outcome: Completed/Met   Problem: RH Grooming Goal: LTG Patient will perform grooming w/assist,cues/equip (OT) Description: LTG: Patient will perform grooming with assist, with/without cues using equipment (OT) Outcome: Completed/Met   Problem: RH Bathing Goal: LTG Patient will bathe all body parts with assist levels (OT) Description: LTG: Patient will bathe all body parts with assist levels (OT) Outcome: Completed/Met   Problem: RH Dressing Goal: LTG Patient will perform upper body dressing (OT) Description: LTG Patient will perform upper body dressing with assist, with/without cues (OT). Outcome: Completed/Met Goal: LTG Patient will perform lower body dressing w/assist (OT) Description: LTG: Patient will perform lower body dressing with assist, with/without cues in positioning using equipment (OT) Outcome: Completed/Met   Problem: RH Toileting Goal: LTG Patient will perform toileting task (3/3 steps) with assistance level (OT) Description: LTG: Patient will perform toileting task (3/3 steps) with assistance level (OT)  Outcome: Completed/Met

## 2023-12-13 NOTE — Progress Notes (Signed)
Physical Therapy Session Note  Patient Details  Name: Sharon Arias MRN: [id number] Date of Birth: 1961/05/26  Today's Date: 12/13/2023 PT Individual Time: 0272-5366 PT Individual Time Calculation (min): 56 min   Short Term Goals: Week 2:  PT Short Term Goal 1 (Week 2): = to LTGs based on ELOS  Skilled Therapeutic Interventions/Progress Updates:      Pt sitting EOB to start - in agreement to PT tx. Per chart review, patient now planned to DC home on 12/26 rather than 12/28. Patient reporting understanding of plan and spoke with primary OT who confirmed.   Pt denies any pain.   Sit<>stand to rollator mod I from EOB height. She ambulates at mod I level from her room to main rehab gym, ~12ft.   Completed BERG balance test with results outlined below. Brief, intermittent rest breaks during testing. Pt with a 16 point improvement since date of evaluation, demonstrating significant improvement in fall risk.   Patient demonstrates increased fall risk as noted by score of   34/56 on Berg Balance Scale.  (<36= high risk for falls, close to 100%; 37-45 significant >80%; 46-51 moderate >50%; 52-55 lower >25%)  Stair training completed using 6" stairs and 2 hand rails. Completed at supervision level to complete x12 steps. Cues for general safety awareness due to baseline vision impairments.  Ambulated back to her room mod I level using the rollator - ended treatment sitting EOB, all needs met. NT/LPN made aware of mod I in the room.   Therapy Documentation Precautions:  Precautions Precautions: Fall, Posterior Hip Precaution Comments: low vision, vertical diplopia, spinning sensation with mobility and at rest Restrictions Weight Bearing Restrictions Per Provider Order: No  Balance: Balance Balance Assessed: Yes Standardized Balance Assessment Standardized Balance Assessment: Berg Balance Test Berg Balance Test Sit to Stand: Able to stand without using hands and stabilize  independently Standing Unsupported: Able to stand safely 2 minutes Sitting with Back Unsupported but Feet Supported on Floor or Stool: Able to sit safely and securely 2 minutes Stand to Sit: Controls descent by using hands Transfers: Able to transfer safely, definite need of hands Standing Unsupported with Eyes Closed: Able to stand 10 seconds with supervision Standing Ubsupported with Feet Together: Able to place feet together independently and stand for 1 minute with supervision From Standing, Reach Forward with Outstretched Arm: Can reach forward >5 cm safely (2") From Standing Position, Pick up Object from Floor: Able to pick up shoe, needs supervision From Standing Position, Turn to Look Behind Over each Shoulder: Needs supervision when turning Turn 360 Degrees: Needs close supervision or verbal cueing Standing Unsupported, Alternately Place Feet on Step/Stool: Able to complete >2 steps/needs minimal assist Standing Unsupported, One Foot in Front: Able to take small step independently and hold 30 seconds Standing on One Leg: Unable to try or needs assist to prevent fall Total Score: 34   Therapy/Group: Individual Therapy  Sharon Arias P Renwick Asman  PT, DPT, CSRS  12/13/2023, 9:28 AM

## 2023-12-13 NOTE — Procedures (Signed)
HD Note:  Some information was entered later than the data was gathered due to patient care needs. The stated time with the data is accurate.  Received patient in wheelchair to unit.  Patient transferred without difficulty to dialysis chair.  Alert and oriented.   Informed consent signed and in chart.   Access used: Upper left arm fistula Access issues: None  Patient tolerated treatment well.   TX duration: 3 hours and 45 min  Alert, without acute distress.  Total UF removed: 1000 ml  Hand-off given to patient's nurse.   Transported back to the room   Lan Mcneill L. Dareen Piano, RN [facility] D[facility] Unit.

## 2023-12-13 NOTE — Progress Notes (Signed)
[facility] Progress Note   Dialysis Orders: MWF - [facility] 3:45hr, 400/800, EDW 115kg, 2K/2.5Ca bath, LUE AVF, 15g - unclear VDRA, ESA, heparin doses.   Assessment/Plan: Debility: In CIR, therapy going well. S/p L pons CVA. Weakness + double vision. On Plavix/ASA/statin.  Worsened L sided facial droop - MRI no new acuity ESRD: Usual MWF schedule - HD today 2nd  shift on holiday schedule STuFr.  HTN/volume: BP elevated, on amlodipine 5mg  every day, losartan 100mg , metoprolol 50mg  BID, hydralazine 50mg  BID.  Trace edema. Still urinates. Does not usually require any UF with HD.  Monitor closely. Anemia of ESRD: Hgb 10.5 - no ESA for now. Secondary HPTH: Ca/Phos ok - continue sevelamer as binder. Nutrition: Alb improving, on protein supp T2DM: Insulin, per primary. CAD, Hx CABG   Subjective:   Patient seen and examined at bedside in room. Headache better; no UF last dialysis treatment on Sunday. Also no drops in BP and 0UF during HD Friday.  Continues to have mild HA now;   MRI completed on Sunday with no acute findings, subacute infarcts of the dorsal left pons and left middle cerebellar peduncle as described previously.   Pt denies fever, chills, nausea, vomiting, myalgias, SOB, CP. Tolerating physical therapy this morning  Objective Vitals:   12/12/23 0500 12/12/23 0512 12/12/23 2010 12/13/23 0500  BP:  (!) 144/42 (!) 154/65 (!) 156/63  Pulse:  63 71 70  Resp:  18 16 16   Temp:  97.6 F (36.4 C) 98.3 F (36.8 C) 98.7 F (37.1 C)  TempSrc:  Oral Oral Oral  SpO2:   95% 99%  Weight: 115 kg   111.8 kg  Height:       Physical Exam General:chronically ill appearing female with L sided facial droop Heart:RRR Lungs:CTAB Abdomen:soft, NTND Extremities:trace LE edema Dialysis Access: LU BCF, bandages removed and hemostasis present, harsh bruit at the inflow  Filed Weights   12/11/23 0500 12/12/23 0500 12/13/23 0500  Weight: 116 kg 115 kg 111.8 kg     Intake/Output Summary (Last 24 hours) at 12/13/2023 0918 Last data filed at 12/13/2023 0700 Gross per 24 hour  Intake 470 ml  Output --  Net 470 ml    Additional Objective Labs: Basic Metabolic Panel: Recent Labs  Lab 12/07/23 1111 12/09/23 2008 12/11/23 0744  NA 141 142 141  K 4.6 4.3 4.1  CL 102 103 100  CO2 26 22 28   GLUCOSE 174* 179* 164*  BUN 65* 59* 43*  CREATININE 5.05* 4.81* 4.61*  CALCIUM 9.9 9.7 9.6  PHOS 5.4* 5.5* 5.8*   Liver Function Tests: Recent Labs  Lab 12/07/23 1111 12/09/23 2008 12/11/23 0744  ALBUMIN 3.7 3.6 3.5   CBC: Recent Labs  Lab 12/07/23 1111 12/09/23 2008  WBC 12.4* 11.5*  NEUTROABS  --  7.3  HGB 11.2* 10.5*  HCT 33.3* 31.2*  MCV 92.2 92.0  PLT 233 234   CBG: Recent Labs  Lab 12/12/23 0607 12/12/23 1142 12/12/23 1712 12/12/23 2110 12/13/23 0555  GLUCAP 221* 154* 78 271* 393*    Studies/Results: MR BRAIN WO CONTRAST Result Date: 12/11/2023 CLINICAL DATA:  Provided history: Facial paralysis/weakness (CN 7). EXAM: MRI HEAD WITHOUT CONTRAST TECHNIQUE: Multiplanar, multiecho pulse sequences of the brain and surrounding structures were obtained without intravenous contrast. COMPARISON:  Brain MRI 11/26/2023. FINDINGS: Brain: Generalized parenchymal atrophy. 4 mm subacute infarct within the left periatrial white matter (series 2, image 25). In retrospect, this was present as an acute infarct on the  prior brain MRI of 11/26/2023. Known small subacute infarct within the dorsal pons and middle cerebellar peduncle on the left. Tiny focus of T2 shine through again noted within the anterior pons just to the right of midline on the diffusion-weighted imaging. Known chronic cortical/subcortical right MCA territory infarct within the right parietal lobe. Associated chronic hemosiderin deposition at this site. Multiple chronic lacunar infarcts again noted within the pons. Background mild multifocal T2 FLAIR hyperintense signal abnormality  within the cerebral white matter and pons, nonspecific but compatible with chronic small vessel ischemic disease. Small chronic infarcts again noted within the bilateral cerebellar hemispheres. 11 mm ovoid FLAIR hyperintense focus in the right paraclinoid region, likely reflecting a meningioma (series 6, image 16). In retrospect, this finding was present on the prior MRI. No extra-axial fluid collection. No midline shift. Vascular: Maintained flow voids within the proximal large arterial vessels. Skull and upper cervical spine: No focal worrisome marrow lesion. Sinuses/Orbits: No mass or acute finding within the imaged orbits. Prior but ocular lens replacement. No significant paranasal sinus disease. IMPRESSION: 1. Subacute infarcts within the dorsal left pons/left middle cerebellar peduncle and left periatrial white matter as described. 2. Background parenchymal atrophy, chronic small vessel ischemic disease and chronic infarcts as described. 3. Probable 11 mm paraclinoid meningioma on the right. Post-contrast MR imaging of the brain is recommended for further evaluation. Electronically Signed   By: Jackey Loge D.O.   On: 12/11/2023 14:32    Medications:   acyclovir  200 mg Oral BID   allopurinol  300 mg Oral Daily   amLODipine  5 mg Oral QHS   artificial tears   Left Eye QHS   aspirin  81 mg Oral Daily   atorvastatin  80 mg Oral Daily   Chlorhexidine Gluconate Cloth  6 each Topical Q0600   Chlorhexidine Gluconate Cloth  6 each Topical Q0600   clopidogrel  75 mg Oral Daily   hydrALAZINE  50 mg Oral BID   insulin aspart  0-9 Units Subcutaneous TID WC   insulin aspart  20 Units Subcutaneous TID WC   insulin glargine-yfgn  63 Units Subcutaneous QHS   losartan  100 mg Oral Daily   metoprolol tartrate  50 mg Oral BID   multivitamin  1 tablet Oral QHS   predniSONE  60 mg Oral Q breakfast   sevelamer carbonate  800 mg Oral TID WC

## 2023-12-13 NOTE — Progress Notes (Signed)
Physical Therapy Discharge Summary  Patient Details  Name: Sharon ALDERFER MRN: [id number] Date of Birth: May 16, 1961  Date of Discharge from PT service:December 13, 2023  Patient has met 9 of 9 long term goals due to improved activity tolerance, improved balance, improved postural control, increased strength, ability to compensate for deficits, functional use of  left lower extremity, improved attention, improved awareness, and improved coordination.  Patient to discharge at an ambulatory level Modified Independent.   Patient's care partner is independent to provide the necessary physical assistance at discharge.  Reasons goals not met: n/a  Functional outcome measure: BERG 18/56 on 12/14 BERG 34/56 on 12/24 5xSTS 23 seconds on 12/24  Recommendation:  Patient will benefit from ongoing skilled PT services in home health setting to continue to advance safe functional mobility, address ongoing impairments in dynamic standing balance, LLE coordination, caregiver training, home safety, and minimize fall risk.  Equipment: No equipment provided  Reasons for discharge: treatment goals met and discharge from hospital  Patient/family agrees with progress made and goals achieved: Yes  PT Discharge Precautions/Restrictions Precautions Precautions: Fall Precaution Comments: ESRD, legally blind Restrictions Weight Bearing Restrictions Per Provider Order: No Pain Interference Pain Interference Pain Effect on Sleep: 1. Rarely or not at all Pain Interference with Therapy Activities: 1. Rarely or not at all Pain Interference with Day-to-Day Activities: 2. Occasionally Vision/Perception  Vision - History Ability to See in Adequate Light: 3 Highly impaired Vision - Assessment Ocular Range of Motion: Within Functional Limits Tracking/Visual Pursuits: Decreased smoothness of horizontal tracking;Decreased smoothness of vertical tracking Convergence: Impaired (comment) (decreased on  L) Perception Perception: Within Functional Limits Praxis Praxis: WFL  Cognition Overall Cognitive Status: Within Functional Limits for tasks assessed Arousal/Alertness: Awake/alert Orientation Level: Oriented X4 Year: 2024 Month: December Day of Week: Correct Attention: Focused;Sustained Focused Attention: Appears intact Sustained Attention: Appears intact Memory: Appears intact Awareness: Appears intact Problem Solving: Appears intact Safety/Judgment: Appears intact Sensation Sensation Light Touch: Impaired Detail Peripheral sensation comments: hx of peripheral neuropathy in her hands with some numbness, but able to sense light touch on test Hot/Cold: Not tested Proprioception: Appears Intact Stereognosis: Appears Intact Additional Comments: Has some neuopathy in hands and feet. Coordination Gross Motor Movements are Fluid and Coordinated: No Fine Motor Movements are Fluid and Coordinated: No Coordination and Movement Description: LLE incoordination and L lean with dynamic balance Motor  Motor Motor: Other (comment) Motor - Discharge Observations: Generalized weakness and deconditioning; LLE incoordination and balance deficits  Mobility Bed Mobility Bed Mobility: Supine to Sit;Sit to Supine Rolling Right: Independent Rolling Left: Independent Supine to Sit: Independent Sit to Supine: Independent;Moderate Assistance - Patient 50-74% Transfers Transfers: Sit to Stand;Stand to Sit;Stand Pivot Transfers Sit to Stand: Independent with assistive device Stand to Sit: Independent with assistive device Stand Pivot Transfers: Independent with assistive device Transfer (Assistive device): Rollator Locomotion  Gait Ambulation: Yes Gait Assistance: Independent with assistive device Gait Distance (Feet): 150 Feet Assistive device: Rollator Gait Gait: Yes Gait Pattern: Impaired Gait Pattern: Wide base of support;Step-through pattern;Lateral trunk lean to right;Lateral  trunk lean to left Stairs / Additional Locomotion Stairs: Yes Stairs Assistance: Supervision/Verbal cueing Stair Management Technique: Two rails;Alternating pattern;Step to pattern;Forwards Number of Stairs: 12 Height of Stairs: 6 Ramp: Independent with assistive device Pick up small object from the floor assist level: Contact Guard/Touching assist Wheelchair Mobility Wheelchair Mobility: No  Trunk/Postural Assessment  Cervical Assessment Cervical Assessment: Within Functional Limits Thoracic Assessment Thoracic Assessment: Exceptions to [facility] (rounded shoulders) Lumbar Assessment Lumbar Assessment:  Within Functional Limits Postural Control Postural Control: Deficits on evaluation Postural Limitations: decreased with L lean bias requiring wide BOS to maintain balance  Balance Balance Balance Assessed: Yes Standardized Balance Assessment Standardized Balance Assessment: Berg Balance Test Berg Balance Test Sit to Stand: Able to stand without using hands and stabilize independently Standing Unsupported: Able to stand safely 2 minutes Sitting with Back Unsupported but Feet Supported on Floor or Stool: Able to sit safely and securely 2 minutes Stand to Sit: Controls descent by using hands Transfers: Able to transfer safely, definite need of hands Standing Unsupported with Eyes Closed: Able to stand 10 seconds with supervision Standing Ubsupported with Feet Together: Able to place feet together independently and stand for 1 minute with supervision From Standing, Reach Forward with Outstretched Arm: Can reach forward >5 cm safely (2") From Standing Position, Pick up Object from Floor: Able to pick up shoe, needs supervision From Standing Position, Turn to Look Behind Over each Shoulder: Needs supervision when turning Turn 360 Degrees: Needs close supervision or verbal cueing Standing Unsupported, Alternately Place Feet on Step/Stool: Able to complete >2 steps/needs minimal  assist Standing Unsupported, One Foot in Front: Able to take small step independently and hold 30 seconds Standing on One Leg: Unable to try or needs assist to prevent fall Total Score: 34 Extremity Assessment      RLE Assessment RLE Assessment: Exceptions to [facility] RLE Strength RLE Overall Strength: Deficits RLE Overall Strength Comments: Grossly 4+/5 LLE Assessment LLE Assessment: Exceptions to [facility] LLE Strength LLE Overall Strength: Deficits LLE Overall Strength Comments: Grossly 4+/5   Sharon Arias P Sharon Arias  PT, DPT, CSRS  12/13/2023, 10:12 AM

## 2023-12-13 NOTE — Progress Notes (Signed)
Patient ID: Sharon Arias, female   DOB: 1961-12-06, 62 y.o.   MRN: [id number]  [facility] orders provided to [facility]

## 2023-12-13 NOTE — Progress Notes (Signed)
Sw met with patient in the room and provided team conference updates. Patient has expressed she prefers to d/c home sooner and would like the d/c date of 12/26 to remain. Patient prefers to start her HD on Saturday and will skip HD on Thursday. Physician and team aware No additional questions or concerns.

## 2023-12-13 NOTE — Progress Notes (Addendum)
Occupational Therapy Session Note  Patient Details  Name: Sharon Arias MRN: [id number] Date of Birth: [birth year]  Today's Date: 12/13/2023 OT Individual Time: 1050-1200 OT Individual Time Calculation (min): 70 min    Short Term Goals: Week 2:  OT Short Term Goal 1 (Week 2): STG=LTG d/t ELOS  Skilled Therapeutic Interventions/Progress Updates:     Pt received sitting up in bed, dressed and ready for the day presenting to be in good spirits receptive to skilled OT session reporting 0/10 pain- OT offering intermittent rest breaks, repositioning, and therapeutic support to optimize participation in therapy session. Focus this session dynamic balance, functional mobility training, fall recovery, and reassessment of Pt's vision, sensation, strength, and balance. Pt completed functional mobility to therapy gym using rollator mod I no LOB noted. Engaged Pt in dynamic standing balance bean bag toss activity using no AD with blocked practice of sit<>stands incorporated into task. Pt instructed to stand from EOB without UE support, toss bean bag at target, and then return to sitting position to retrieve next bean bag. Pt able to complete total of 10 sit<>stands from EOB with close supervision to CGA without AD and maintain dynamic balance while anteriorly weight shifting to throw bean bag at target. Seated rest break provided following. Pt utilized reacher to retrieve bean bags to practice safely retrieving items from ground upon d/c. Pt completed task without AD for increased balance challenge, however Pt educated on importance of using rollator at home to increase safety and avoid falls. Pt able to retrieve 10/10 bean bags using reacher with CGA to light min A provided for balance. Following activity, when turning to ambulate back to mat table, Pt with LOB and unable to correct d/t decreased stepping strategies and decreased righting reactions- OT attempted to provide max A to correct LOB, however Pt stated  "I just want to sit" so OT provided assist to slowly lower Pt to ground. Provided education on fall recovery techniques with Pt verbalizing understanding and demonstrating teach back as evidence of learning. Pt able to crawl to mat table on hands and knees and return to sitting position using B UEs supported on mat and pivoting hips to turn and sit with CGA. Pt fatigued following with prolonged seated rest break provided. Engaged Pt in seated FM/VMC activities to increase [facility] for increased independence in BADLs and IADLs such as medication management and cooking skills. Pt instructed to utilize L hand to place small pegs into peg board while storing 3-4 pegs into her hand and translating pegs from palm to finger tips. Pt able to place 25 pegs without dropping and then remove pegs using small tweezers with increased time and min verbal cues required d/t visual deficits. Pt then completed FM activity using nuts/bolts and allen wrench to work on [employer] requires for simple household tasks. Pt able to place and remove 8/8 bolts using L UE and intermittently utilizing R UE as a stabilizer with increased time and no dropping noted. Pt completed functional back to room using rollator mod I no LOB. Pt was left resting in recliner with call bell in reach, no alarm on as Pt is safe to ambulate in her room with rollator, and all needs met.    Therapy Documentation Precautions:  Precautions Precautions: Fall Precaution Comments: ESRD, legally blind Restrictions Weight Bearing Restrictions Per Provider Order: No ADL: ADL Eating: Set up Where Assessed-Eating: Bed level Grooming: Setup Where Assessed-Grooming: Standing at sink Upper Body Bathing: Setup Where Assessed-Upper Body Bathing: Shower Lower  Body Bathing: Contact guard Where Assessed-Lower Body Bathing: Shower Upper Body Dressing: Setup Where Assessed-Upper Body Dressing: Chair Lower Body Dressing: Minimal assistance Where Assessed-Lower Body  Dressing: Chair Toileting: Contact guard Where Assessed-Toileting: Teacher, adult education: Furniture conservator/restorer Method: Proofreader: Acupuncturist: Insurance underwriter Method: Designer, industrial/product: Grab bars, Sales promotion account executive Baseline Vision/History: 2 Legally blind;6 Macular Degeneration Patient Visual Report: No change from baseline Vision Assessment?: Yes Ocular Range of Motion: Within Functional Limits Tracking/Visual Pursuits: Decreased smoothness of horizontal tracking;Decreased smoothness of vertical tracking Convergence: Impaired (comment) (decreased on L) Visual Fields: No apparent deficits Perception  Perception: Within Functional Limits Praxis Praxis: WFL Balance Balance Balance Assessed: Yes Standardized Balance Assessment Standardized Balance Assessment: Berg Balance Test Berg Balance Test Sit to Stand: Able to stand without using hands and stabilize independently Standing Unsupported: Able to stand safely 2 minutes Sitting with Back Unsupported but Feet Supported on Floor or Stool: Able to sit safely and securely 2 minutes Stand to Sit: Controls descent by using hands Transfers: Able to transfer safely, definite need of hands Standing Unsupported with Eyes Closed: Able to stand 10 seconds with supervision Standing Ubsupported with Feet Together: Able to place feet together independently and stand for 1 minute with supervision From Standing, Reach Forward with Outstretched Arm: Can reach forward >5 cm safely (2") From Standing Position, Pick up Object from Floor: Able to pick up shoe, needs supervision From Standing Position, Turn to Look Behind Over each Shoulder: Needs supervision when turning Turn 360 Degrees: Needs close supervision or verbal cueing Standing Unsupported, Alternately Place Feet on Step/Stool: Able to complete >2 steps/needs minimal  assist Standing Unsupported, One Foot in Front: Able to take small step independently and hold 30 seconds Standing on One Leg: Unable to try or needs assist to prevent fall Total Score: 34   Therapy/Group: Individual Therapy  Clide Deutscher 12/13/2023, 12:30 PM

## 2023-12-13 NOTE — Progress Notes (Signed)
   12/13/23 2209  BiPAP/CPAP/SIPAP  $ Non-Invasive Home Ventilator  Subsequent  BiPAP/CPAP/SIPAP Pt Type Adult  BiPAP/CPAP/SIPAP Resmed  Mask Type Full face mask  Mask Size Small  Respiratory Rate 20 breaths/min  PEEP 8 cmH20  FiO2 (%) 21 %  CPAP/SIPAP surface wiped down Yes

## 2023-12-13 NOTE — Plan of Care (Signed)

## 2023-12-13 NOTE — Progress Notes (Signed)
Occupational Therapy Discharge Summary  Patient Details  Name: Sharon Arias MRN: [id number] Date of Birth: [birth year]  Date of Discharge from OT service:December 13, 2023  Today's Date: 12/13/2023 OT Individual Time: 0800-0900 OT Individual Time Calculation (min): 60 min    Patient has met 6 of 6 long term goals due to improved activity tolerance, improved balance, postural control, ability to compensate for deficits, improved attention, improved awareness, and improved coordination.  Patient to discharge at overall Modified Independent level.  Patient's care partner is independent to provide the necessary physical assistance at discharge.    Reasons goals not met: All goals met  Recommendation:  Patient will benefit from ongoing skilled OT services in home health setting to continue to advance functional skills in the area of BADL, iADL, and Reduce care partner burden.  Equipment: TTB  Reasons for discharge: treatment goals met and discharge from hospital  Patient/family agrees with progress made and goals achieved: Yes  OT Discharge Skilled Therapeutic Interventions/Progress Updates:  Pt received sitting EOB dressed for the day upon OT arrival. Pt presenting to be in good spirits receptive to skilled OT session reporting 0/10 pain- OT offering intermittent rest breaks, repositioning, and therapeutic support to optimize participation in therapy session. Pt reporting improvement in dizziness symptoms this session. Focus this session activity tolerance, BADL retraining, and functional mobility training. Pt requesting to use restroom at beginning of session. Pt donned shoes sitting EOB mod I. Functional mobility to bathroom using rollator mod I with improved stability and no LOB noted this trial. Pt transferred to standard toilet using rollator and completed 3/3 toileting tasks mod I this session with increased time required for continent void in toilet. Pt completed functional mobility to  sink and chose to sit on rollator seat for energy conservation while completing grooming/hygiene tasks. Pt demonstrating good application of fall prevention and energy conservation techniques throughout session and BADLs. MD in/out for morning rounding and RN in/out to provide morning medications. Engaged Pt in completing functional mobility to therapy gym for endurance and functional mobility training. During functional mobility, Pt instructed to visually scan from R/L and slowly turn head to address vestibular deficits. Pt reporting increase in dizziness symptoms when holding head in midline with gaze upward and decreased symptoms when keeping eyes down towards ground. Educated Pt on safety considerations with dizziness symptoms and when looking at ground with pt receptive to education. Engaged Pt in B U/LE endurance and strength training on NuStep to increase activity tolerance for BADLs and light IADLs. Pt completed 3 intervals of 3 minutes on level 3 setting with 2 minutes of seated rest breaks provided between each interval. Pt reporting she feels as if her endurance has improved over the past week and she is feeling more prepared for d/c. Educated Pt on simple at home exercise intervals and how to break up exercises to conserve energy with Pt receptive to education. Prolonged seated rest breaks provided following NuStep training prior to completing functional mobility back to room. Pt able to ambulate >150 ft to room using rollator without LOB, however increased time provided d/t mild fatigue. Pt was left resting sitting EOB with call bell in reach, bed alarm on, and all needs met.   Precautions/Restrictions  Precautions Precautions: Fall Precaution Comments: ESRD, legally blind Restrictions Weight Bearing Restrictions Per Provider Order: No Pain Pain Assessment Pain Scale: 0-10 Pain Score: 0-No pain ADL ADL Equipment Provided: Long-handled shoe horn, Reacher Eating: Independent Where  Assessed-Eating: Chair Grooming: Modified independent Where Assessed-Grooming:  Standing at sink, Sitting at sink Upper Body Bathing: Modified independent Where Assessed-Upper Body Bathing: Shower Lower Body Bathing: Modified independent Where Assessed-Lower Body Bathing: Shower Upper Body Dressing: Modified independent (Device) Where Assessed-Upper Body Dressing: Edge of bed Lower Body Dressing: Modified independent Where Assessed-Lower Body Dressing: Edge of bed Toileting: Modified independent Where Assessed-Toileting: Teacher, adult education: Engineer, agricultural Method: Proofreader: Engineer, technical sales: Not assessed Film/video editor: Cytogeneticist Method: Designer, industrial/product: Grab bars, Sales promotion account executive Baseline Vision/History: 2 Legally blind;6 Macular Degeneration Patient Visual Report: No change from baseline Vision Assessment?: Yes Eye Alignment: Within Functional Limits (significant improvement from inital eval) Ocular Range of Motion: Within Functional Limits Tracking/Visual Pursuits: Decreased smoothness of horizontal tracking;Decreased smoothness of vertical tracking Saccades: Additional eye shifts occurred during testing Convergence: Within functional limits (decreased on L, no double vision reported) Visual Fields: No apparent deficits Perception  Perception: Within Functional Limits Praxis Praxis: WFL Cognition Cognition Overall Cognitive Status: Within Functional Limits for tasks assessed Arousal/Alertness: Awake/alert Orientation Level: Person;Place;Situation Person: Oriented Place: Oriented Situation: Oriented Memory: Appears intact Attention: Selective Focused Attention: Appears intact Sustained Attention: Appears intact Awareness: Appears intact Problem Solving: Appears intact Safety/Judgment: Appears intact Brief Interview for Mental  Status (BIMS) Repetition of Three Words (First Attempt): 3 Temporal Orientation: Year: Correct Temporal Orientation: Month: Accurate within 5 days Temporal Orientation: Day: Correct Recall: "Sock": Yes, no cue required Recall: "Blue": Yes, no cue required Recall: "Bed": Yes, no cue required BIMS Summary Score: 15 Sensation Sensation Light Touch: Impaired Detail Peripheral sensation comments: hx of peripheral neuropathy in her hands with some numbness, but able to sense light touch on test Light Touch Impaired Details: Impaired RLE;Impaired LLE Hot/Cold: Not tested Proprioception: Appears Intact Stereognosis: Appears Intact Additional Comments: Has some neuopathy in hands and feet. Coordination Gross Motor Movements are Fluid and Coordinated: No Fine Motor Movements are Fluid and Coordinated: No Coordination and Movement Description: LLE incoordination and L lean with dynamic balance Finger Nose Finger Test: Mildly slow on L UE, difficult to complete 2/2 visual deficit Motor  Motor Motor: Other (comment) Motor - Discharge Observations: Generalized weakness and deconditioning; LLE incoordination and balance deficits Mobility  Bed Mobility Bed Mobility: Supine to Sit;Sit to Supine Rolling Right: Independent Rolling Left: Independent Supine to Sit: Independent Sit to Supine: Independent Transfers Sit to Stand: Independent with assistive device Stand to Sit: Independent with assistive device  Trunk/Postural Assessment  Cervical Assessment Cervical Assessment: Within Functional Limits Thoracic Assessment Thoracic Assessment: Exceptions to [facility] (mild rounded shoulders) Lumbar Assessment Lumbar Assessment: Within Functional Limits Postural Control Postural Control: Deficits on evaluation Postural Limitations: decreased with L lean bias requiring wide BOS to maintain balance  Balance Balance Balance Assessed: Yes Standardized Balance Assessment Standardized Balance  Assessment: Berg Balance Test Berg Balance Test Sit to Stand: Able to stand without using hands and stabilize independently Standing Unsupported: Able to stand safely 2 minutes Sitting with Back Unsupported but Feet Supported on Floor or Stool: Able to sit safely and securely 2 minutes Stand to Sit: Controls descent by using hands Transfers: Able to transfer safely, definite need of hands Standing Unsupported with Eyes Closed: Able to stand 10 seconds with supervision Standing Ubsupported with Feet Together: Able to place feet together independently and stand for 1 minute with supervision From Standing, Reach Forward with Outstretched Arm: Can reach forward >5 cm safely (2") From Standing Position, Pick up Object from Floor: Able to pick up  shoe, needs supervision From Standing Position, Turn to Look Behind Over each Shoulder: Needs supervision when turning Turn 360 Degrees: Needs close supervision or verbal cueing Standing Unsupported, Alternately Place Feet on Step/Stool: Able to complete >2 steps/needs minimal assist Standing Unsupported, One Foot in Front: Able to take small step independently and hold 30 seconds Standing on One Leg: Unable to try or needs assist to prevent fall Total Score: 34 Static Sitting Balance Static Sitting - Balance Support: Feet supported Static Sitting - Level of Assistance: 7: Independent Dynamic Sitting Balance Dynamic Sitting - Balance Support: Feet supported Dynamic Sitting - Level of Assistance: 7: Independent Static Standing Balance Static Standing - Balance Support: During functional activity;Bilateral upper extremity supported Static Standing - Level of Assistance: 6: Modified independent (Device/Increase time) Dynamic Standing Balance Dynamic Standing - Balance Support: During functional activity;Bilateral upper extremity supported Dynamic Standing - Level of Assistance: 6: Modified independent (Device/Increase time) Extremity/Trunk  Assessment RUE Assessment RUE Assessment: Within Functional Limits Active Range of Motion (AROM) Comments: Full ROM General Strength Comments: 4+/5 LUE Assessment LUE Assessment: Within Functional Limits Active Range of Motion (AROM) Comments: WFL grossly General Strength Comments: 4/5 grossly   Clide Deutscher 12/13/2023, 12:44 PM

## 2023-12-13 NOTE — Progress Notes (Signed)
Patient ID: Sharon Arias, female   DOB: [birth year], 62 y.o.   MRN: [id number]  TTB ordered thorough [facility] and shipped to pt's home per request.

## 2023-12-14 DIAGNOSIS — N186 End stage renal disease: Secondary | ICD-10-CM | POA: Diagnosis not present

## 2023-12-14 DIAGNOSIS — E1140 Type 2 diabetes mellitus with diabetic neuropathy, unspecified: Secondary | ICD-10-CM | POA: Diagnosis not present

## 2023-12-14 DIAGNOSIS — I639 Cerebral infarction, unspecified: Secondary | ICD-10-CM | POA: Diagnosis not present

## 2023-12-14 DIAGNOSIS — I2510 Atherosclerotic heart disease of native coronary artery without angina pectoris: Secondary | ICD-10-CM | POA: Diagnosis not present

## 2023-12-14 LAB — GLUCOSE, CAPILLARY
Glucose-Capillary: 355 mg/dL — ABNORMAL HIGH (ref 70–99)
Glucose-Capillary: 234 mg/dL — ABNORMAL HIGH (ref 70–99)
Glucose-Capillary: 302 mg/dL — ABNORMAL HIGH (ref 70–99)
Glucose-Capillary: 287 mg/dL — ABNORMAL HIGH (ref 70–99)

## 2023-12-14 MED ORDER — INSULIN GLARGINE-YFGN 100 UNIT/ML ~~LOC~~ SOLN
66.0000 [IU] | Freq: Every day | SUBCUTANEOUS | Status: DC
  Administered 2023-12-14: 66 [IU] via SUBCUTANEOUS
  Filled 2023-12-14 (×2): qty 0.66

## 2023-12-14 NOTE — Plan of Care (Signed)
  Problem: Consults Goal: RH STROKE PATIENT EDUCATION Description: See Patient Education module for education specifics  Outcome: Progressing   Problem: RH BOWEL ELIMINATION Goal: RH STG MANAGE BOWEL WITH ASSISTANCE Description: STG Manage Bowel with toileting Assistance. Outcome: Progressing Goal: RH STG MANAGE BOWEL W/MEDICATION W/ASSISTANCE Description: STG Manage Bowel with Medication with mod I Assistance. Outcome: Progressing   Problem: RH SAFETY Goal: RH STG ADHERE TO SAFETY PRECAUTIONS W/ASSISTANCE/DEVICE Description: STG Adhere to Safety Precautions With cues Assistance/Device. Outcome: Progressing   Problem: RH KNOWLEDGE DEFICIT Goal: RH STG INCREASE KNOWLEDGE OF DIABETES Description: Patient and family will be able to manage DM using educational resources for medications and dietary modifications independently Outcome: Progressing Goal: RH STG INCREASE KNOWLEDGE OF HYPERTENSION Description: Patient and family will be able to manage HTN using educational resources for medications and dietary modifications independently Outcome: Progressing Goal: RH STG INCREASE KNOWLEGDE OF HYPERLIPIDEMIA Description: Patient and family will be able to manage HLD using educational resources for medications and dietary modifications independently Outcome: Progressing Goal: RH STG INCREASE KNOWLEDGE OF STROKE PROPHYLAXIS Description: Patient and family will be able to manage secondary risks using educational resources for medications and dietary modifications independently Outcome: Progressing

## 2023-12-14 NOTE — Progress Notes (Signed)
PROGRESS NOTE   Subjective/Complaints:  Slept well, discussed left facial weakness and L lid closure, still with difficulty   ROS:  + Left facial droop, generalized fatigue Patient denies any shortness of breath, chest pain, or leg swelling.  Patient does endorse some dizziness.   Objective:   No results found.  Recent Labs    12/13/23 1341  WBC 16.6*  HGB 10.4*  HCT 31.1*  PLT 258   Recent Labs    12/13/23 1341  NA 136  K 4.7  CL 97*  CO2 22  GLUCOSE 272*  BUN 59*  CREATININE 5.02*  CALCIUM 10.0    Intake/Output Summary (Last 24 hours) at 12/14/2023 1610 Last data filed at 12/13/2023 1740 Gross per 24 hour  Intake --  Output 1000 ml  Net -1000 ml        Physical Exam: Vital Signs Blood pressure (!) 150/54, pulse (!) 58, temperature 98.5 F (36.9 C), temperature source Oral, resp. rate 17, height 5\' 7"  (1.702 m), weight 111.4 kg, SpO2 98%.     General: No acute distress.  Sitting up at bedside.+ Obese Mood and affect are appropriate Heart: Regular rate and rhythm no rubs murmurs or extra sounds Lungs: Clear to auscultation, breathing unlabored, no rales or wheezes Abdomen: Positive bowel sounds, soft nontender to palpation, nondistended Extremities: No clubbing, cyanosis, or edema Skin: No evidence of breakdown, no evidence of rash Neurologic:  Awake, alert, and oriented x 4.  Mild dysarthria, otherwise no apparent aphasia, cognitively intact. Mod CN 7 palsy, left perioral movement slightly improved today  Cranial nerves: Left lower eyelid droop, unable to fully close eye.  Left facial droop.  Otherwise, intact. motor strength is 5/5 in bilateral deltoid, bicep, tricep, grip, hip flexor, knee extensors, ankle dorsiflexor and plantar flexor-unchanged from prior exams 12-25  Cerebellar exam normal finger to nose to finger as well as heel to shin in bilateral upper and lower extremities  -finger-to-nose exam normal 12-25 Musculoskeletal: Full range of motion in all 4 extremities. No joint swelling       Assessment/Plan: 1. Functional deficits which require 3+ hours per day of interdisciplinary therapy in a comprehensive inpatient rehab setting. Physiatrist is providing close team supervision and 24 hour management of active medical problems listed below. Physiatrist and rehab team continue to assess barriers to discharge/monitor patient progress toward functional and medical goals  Care Tool:  Bathing    Body parts bathed by patient: Right arm, Left upper leg, Right lower leg, Left arm, Chest, Left lower leg, Abdomen, Face, Front perineal area, Buttocks, Right upper leg         Bathing assist Assist Level: Independent with assistive device     Upper Body Dressing/Undressing Upper body dressing   What is the patient wearing?: Pull over shirt    Upper body assist Assist Level: Independent with assistive device    Lower Body Dressing/Undressing Lower body dressing      What is the patient wearing?: Underwear/pull up, Pants     Lower body assist Assist for lower body dressing: Independent with assitive device     Toileting Toileting    Toileting assist Assist for toileting: Independent with  assistive device     Transfers Chair/bed transfer  Transfers assist     Chair/bed transfer assist level: Independent with assistive device Chair/bed transfer assistive device: Armrests, Geologist, engineering   Ambulation assist      Assist level: Independent with assistive device Assistive device: Rollator Max distance: 261ft   Walk 10 feet activity   Assist     Assist level: Independent with assistive device Assistive device: Rollator   Walk 50 feet activity   Assist    Assist level: Independent with assistive device Assistive device: Rollator    Walk 150 feet activity   Assist Walk 150 feet activity did not occur:  Safety/medical concerns  Assist level: Independent with assistive device Assistive device: Rollator    Walk 10 feet on uneven surface  activity   Assist     Assist level: Independent with assistive device Assistive device: Rollator   Wheelchair     Assist Is the patient using a wheelchair?: No Type of Wheelchair: Manual    Wheelchair assist level: Dependent - Patient 0%      Wheelchair 50 feet with 2 turns activity    Assist        Assist Level: Dependent - Patient 0%   Wheelchair 150 feet activity     Assist      Assist Level: Dependent - Patient 0%   Blood pressure (!) 150/54, pulse (!) 58, temperature 98.5 F (36.9 C), temperature source Oral, resp. rate 17, height 5\' 7"  (1.702 m), weight 111.4 kg, SpO2 98%.  Medical Problem List and Plan: 1. Functional deficits secondary to ischemic infarct left brachium pontis secondary to small vessel disease Increased facial droop, discussed with Vascular neuro who rec MRI , to see if CVA extension , symptoms are limited to anatomic area of previous infarct and are limited to facial droop, differential includes Bell's Palsy             -patient may  shower             -ELOS/Goals: d/c in am     2.  Antithrombotics: -DVT/anticoagulation:  Pharmaceutical: Heparin             -antiplatelet therapy: Aspirin 81 mg daily and Plavix 75 mg daily as prior to admission 3. Pain Management: Tylenol as needed  12/14- denies pain- con't tlyenol prn  4. Mood/Behavior/Sleep: Provide emotional support             -antipsychotic agents: N/A 5. Neuropsych/cognition: This patient is capable of making decisions on her own behalf. 6. Skin/Wound Care: Routine skin checks 7. Fluids/Electrolytes/Nutrition: Routine in and outs with follow-up chemistries  12/23: Labs stable 8.  End-stage renal disease.  HD today.  Continue HD per nephrology 9.  CAD with CABG 2013.  No chest pain or shortness of breath.  Continue aspirin and  Plavix 10.  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  Hemoglobin A1c 8.1. Novolog 20 units 3 times daily with meals. Semglee 63 units nightly.  Blood glucose does continue to measure well, did have fasting of 90 today, we will keep an eye on this.  If need to, can decrease insulin.  12-23: Blood sugars relatively well-controlled.  Continue current regimen.   CBG (last 3)  Recent Labs    12/13/23 1809 12/13/23 2033 12/14/23 0546  GLUCAP 196* 312* 355*  Poor control due to steroids , increase semglee  11.  Hypertension.  Cozaar 100 mg daily, hydralazine 50 mg twice daily,  Norvasc 5 mg daily, Lopressor 50 mg twice daily.  Scopolamine patch was removed, blood pressure did elevate into the 150s.  Given patient having dialysis today, will hold off on increasing antihypertensive medications at this time.   Vitals:   12/14/23 0523 12/14/23 0800  BP: 126/76 (!) 150/54  Pulse: (!) 55 (!) 58  Resp: 18 17  Temp: 98 F (36.7 C) 98.5 F (36.9 C)  SpO2: 99% 98%  Some lability with systolic this am cont to monitor no med changes for now   12.  Hyperlipidemia.  Lipitor 13.  Diastolic congestive heart failure.  Lasix 40 mg Tuesday Thursday Saturday Sunday.  No signs of volume overload. 14.  History of gout.  Zyloprim 300 mg daily.  Monitor for any gout flares, no concern for gout flare at this time. 15.Obesity.  BMI 39.50.  Dietary follow-up 16.  Anemia of chronic disease.  No acute concerns at this time.  Hemoglobin slightly decreased, but no concerns for bleeding.  17. Constipation, no bowel movement yesterday, this makes 2 days with no bowel movement.  If need be, can give patient stool softener.  Will add on as needed.  - Last bowel movement 12-21, not viewed.  18.  Diabetic retinopathy f/u optho   19.  Dizziness: Likely secondary to stroke.  Dizziness is improving slowly.   Will continue with therapy.  20.  Acute Cystitis: UA showing evidence of pyuria.  Today 4 of 5 of  cephalexin  21.  Left facial droop -likely Bell's palsy.  -MRI brain 12-22 without new lesions/changes.  12-23: No improvement since Friday, neurology consulted, agree with tentative diagnosis of Bell's palsy and starting acyclovir and prednisone-appreciate their assistance and recommendations.   - Adding ophthalmic ointment for left eye nightly.  Inquiring into nursing taping for lower eyelid. 22.  Leukocytosis- due to steroids, afeb LOS: 12 days A FACE TO FACE EVALUATION WAS PERFORMED  Erick Colace 12/14/2023, 8:22 AM

## 2023-12-14 NOTE — Progress Notes (Signed)
[facility] Progress Note   Dialysis Orders: MWF - [facility] 3:45hr, 400/800, EDW 115kg, 2K/2.5Ca bath, LUE AVF, 15g - unclear VDRA, ESA, heparin doses.   Assessment/Plan: Debility: In CIR, therapy going well. S/p L pons CVA. Weakness + double vision. On Plavix/ASA/statin.  Worsened L sided facial droop - MRI no new acuity ESRD: Usual MWF schedule; limited dialysis Tuesday with 1 L net UF.  Appears patient may be discharged soon and can receive outpatient dialysis on Friday HTN/volume: BP elevated, on amlodipine 5mg  every day, losartan 100mg , metoprolol 50mg  BID, hydralazine 50mg  BID.  Trace edema. Still urinates. Does not usually require any UF with HD.  Monitor closely. Anemia of ESRD: Hgb 10.5 - no ESA for now. Secondary HPTH: Ca/Phos ok - continue sevelamer as binder. Nutrition: Alb improving, on protein supp T2DM: Insulin, per primary. CAD, Hx CABG   Subjective:   Patient seen and examined at bedside in room. Headache better; only 1 L UF last dialysis treatment on Tues.  MRI completed on Sunday with no acute findings, subacute infarcts of the dorsal left pons and left middle cerebellar peduncle as described previously.   Pt denies fever, chills, nausea, vomiting, myalgias, SOB, CP. Tolerating physical therapy  Objective Vitals:   12/13/23 1810 12/13/23 2142 12/14/23 0523 12/14/23 0800  BP: (!) 148/61 (!) 148/61 126/76 (!) 150/54  Pulse: 73 73 (!) 55 (!) 58  Resp: 19  18 17   Temp: 98.8 F (37.1 C)  98 F (36.7 C) 98.5 F (36.9 C)  TempSrc:    Oral  SpO2: 99%  99% 98%  Weight:      Height:       Physical Exam General:chronically ill appearing female with L sided facial droop Heart:RRR Lungs:CTAB Abdomen:soft, NTND Extremities:trace LE edema Dialysis Access: LU BCF, bandages removed and hemostasis present, harsh bruit at the inflow  Filed Weights   12/12/23 0500 12/13/23 0500 12/13/23 1740  Weight: 115 kg 111.8 kg 111.4 kg     Intake/Output Summary (Last 24 hours) at 12/14/2023 1020 Last data filed at 12/14/2023 0826 Gross per 24 hour  Intake 240 ml  Output 1000 ml  Net -760 ml    Additional Objective Labs: Basic Metabolic Panel: Recent Labs  Lab 12/09/23 2008 12/11/23 0744 12/13/23 1341  NA 142 141 136  K 4.3 4.1 4.7  CL 103 100 97*  CO2 22 28 22   GLUCOSE 179* 164* 272*  BUN 59* 43* 59*  CREATININE 4.81* 4.61* 5.02*  CALCIUM 9.7 9.6 10.0  PHOS 5.5* 5.8* 4.0   Liver Function Tests: Recent Labs  Lab 12/09/23 2008 12/11/23 0744 12/13/23 1341  ALBUMIN 3.6 3.5 3.9   CBC: Recent Labs  Lab 12/07/23 1111 12/09/23 2008 12/13/23 1341  WBC 12.4* 11.5* 16.6*  NEUTROABS  --  7.3  --   HGB 11.2* 10.5* 10.4*  HCT 33.3* 31.2* 31.1*  MCV 92.2 92.0 92.0  PLT 233 234 258   CBG: Recent Labs  Lab 12/13/23 0555 12/13/23 1224 12/13/23 1809 12/13/23 2033 12/14/23 0546  GLUCAP 393* 204* 196* 312* 355*    Studies/Results: No results found.   Medications:   acyclovir  200 mg Oral BID   allopurinol  300 mg Oral Daily   amLODipine  5 mg Oral QHS   artificial tears   Left Eye QHS   aspirin  81 mg Oral Daily   atorvastatin  80 mg Oral Daily   Chlorhexidine Gluconate Cloth  6 each Topical Q0600   Chlorhexidine  Gluconate Cloth  6 each Topical Q0600   clopidogrel  75 mg Oral Daily   hydrALAZINE  50 mg Oral BID   insulin aspart  0-9 Units Subcutaneous TID WC   insulin aspart  20 Units Subcutaneous TID WC   insulin glargine-yfgn  66 Units Subcutaneous QHS   losartan  100 mg Oral Daily   metoprolol tartrate  50 mg Oral BID   multivitamin  1 tablet Oral QHS   predniSONE  60 mg Oral Q breakfast   sevelamer carbonate  800 mg Oral TID WC

## 2023-12-14 NOTE — Progress Notes (Signed)
Inpatient Rehabilitation Care Coordinator Discharge Note   Patient Details  Name: Sharon Arias MRN: [id number] Date of Birth: [birth year]   Discharge location: D/c to home  Length of Stay: 13 days  Discharge activity level: Mod I  Home/community participation: Limited  Patient response WU:[id number] Literacy - How often do you need to have someone help you when you read instructions, pamphlets, or other written material from your doctor or pharmacy?: Sometimes  Patient response YN:WGNFAO Isolation - How often do you feel lonely or isolated from those around you?: Never  Services provided included: MD, RD, PT, OT, RN, Pharmacy, Neuropsych, SW, TR, CM  Financial Services:  Field seismologist Utilized: Media planner [employer]  Choices offered to/list presented to: patient  Follow-up services arranged:  Home Health, DME Home Health Agency: Resume services with [facility] for PT/OT    DME : [employer] [facility] for TTB- being shipped to pt home    Patient response to transportation need: Is the patient able to respond to transportation needs?: Yes In the past 12 months, has lack of transportation kept you from medical appointments or from getting medications?: No In the past 12 months, has lack of transportation kept you from meetings, work, or from getting things needed for daily living?: No   Patient/Family verbalized understanding of follow-up arrangements:  Yes  Individual responsible for coordination of the follow-up plan: contact pt  Confirmed correct DME delivered: Gretchen Short 12/14/2023    Comments (or additional information):  Summary of Stay    Date/Time Discharge Planning CSW  12/12/23 1544 Discharged moved to 12/26. CJB  12/06/23 1441 Discharging home with daughter to assist during the day and son to assist at night. 1 level home, 2 steps. Patient has: cane, RW, rollator, BSC. shower seat and electric scooter. CJB       Icy Fuhrmann A Lula Olszewski

## 2023-12-15 ENCOUNTER — Encounter: Payer: Self-pay | Admitting: Internal Medicine

## 2023-12-15 ENCOUNTER — Other Ambulatory Visit (HOSPITAL_COMMUNITY): Payer: Self-pay

## 2023-12-15 DIAGNOSIS — I639 Cerebral infarction, unspecified: Secondary | ICD-10-CM | POA: Diagnosis not present

## 2023-12-15 LAB — GLUCOSE, CAPILLARY: Glucose-Capillary: 172 mg/dL — ABNORMAL HIGH (ref 70–99)

## 2023-12-15 MED ORDER — ALLOPURINOL 300 MG PO TABS
300.0000 mg | ORAL_TABLET | Freq: Every morning | ORAL | 0 refills | Status: DC
  Filled 2023-12-15: qty 30, 30d supply, fill #0

## 2023-12-15 MED ORDER — ACYCLOVIR 200 MG PO CAPS
200.0000 mg | ORAL_CAPSULE | Freq: Two times a day (BID) | ORAL | 0 refills | Status: DC
  Filled 2023-12-15: qty 60, 30d supply, fill #0

## 2023-12-15 MED ORDER — INSULIN LISPRO (1 UNIT DIAL) 100 UNIT/ML (KWIKPEN)
20.0000 [IU] | PEN_INJECTOR | Freq: Three times a day (TID) | SUBCUTANEOUS | 11 refills | Status: DC
  Filled 2023-12-15: qty 15, 25d supply, fill #0

## 2023-12-15 MED ORDER — AMLODIPINE BESYLATE 5 MG PO TABS
5.0000 mg | ORAL_TABLET | Freq: Every day | ORAL | 0 refills | Status: DC
  Filled 2023-12-15: qty 30, 30d supply, fill #0

## 2023-12-15 MED ORDER — CLOPIDOGREL BISULFATE 75 MG PO TABS
75.0000 mg | ORAL_TABLET | Freq: Every day | ORAL | 0 refills | Status: DC
  Filled 2023-12-15: qty 30, 30d supply, fill #0

## 2023-12-15 MED ORDER — PREDNISONE 20 MG PO TABS
60.0000 mg | ORAL_TABLET | Freq: Every day | ORAL | 0 refills | Status: DC
  Filled 2023-12-15: qty 9, 3d supply, fill #0

## 2023-12-15 MED ORDER — SEVELAMER CARBONATE 800 MG PO TABS
800.0000 mg | ORAL_TABLET | Freq: Three times a day (TID) | ORAL | 0 refills | Status: DC
  Filled 2023-12-15: qty 30, 10d supply, fill #0

## 2023-12-15 MED ORDER — HYDRALAZINE HCL 50 MG PO TABS
50.0000 mg | ORAL_TABLET | Freq: Two times a day (BID) | ORAL | 0 refills | Status: DC
  Filled 2023-12-15: qty 60, 30d supply, fill #0

## 2023-12-15 MED ORDER — METOPROLOL TARTRATE 50 MG PO TABS
50.0000 mg | ORAL_TABLET | Freq: Two times a day (BID) | ORAL | 0 refills | Status: DC
  Filled 2023-12-15: qty 60, 30d supply, fill #0

## 2023-12-15 MED ORDER — FUROSEMIDE 40 MG PO TABS
40.0000 mg | ORAL_TABLET | Freq: Every day | ORAL | 0 refills | Status: DC | PRN
  Filled 2023-12-15: qty 30, 30d supply, fill #0

## 2023-12-15 MED ORDER — ACYCLOVIR 200 MG PO CAPS
200.0000 mg | ORAL_CAPSULE | Freq: Two times a day (BID) | ORAL | 0 refills | Status: DC
  Filled 2023-12-15: qty 12, 6d supply, fill #0

## 2023-12-15 MED ORDER — INSULIN GLARGINE 100 UNIT/ML SOLOSTAR PEN
66.0000 [IU] | PEN_INJECTOR | Freq: Every day | SUBCUTANEOUS | 11 refills | Status: DC
  Filled 2023-12-15: qty 15, 22d supply, fill #0

## 2023-12-15 MED ORDER — ATORVASTATIN CALCIUM 80 MG PO TABS
80.0000 mg | ORAL_TABLET | Freq: Every day | ORAL | 0 refills | Status: DC
  Filled 2023-12-15: qty 30, 30d supply, fill #0

## 2023-12-15 MED ORDER — LOSARTAN POTASSIUM 100 MG PO TABS
100.0000 mg | ORAL_TABLET | Freq: Every evening | ORAL | 0 refills | Status: DC
  Filled 2023-12-15: qty 30, 30d supply, fill #0

## 2023-12-15 MED ORDER — MECLIZINE HCL 25 MG PO TABS
25.0000 mg | ORAL_TABLET | Freq: Three times a day (TID) | ORAL | 0 refills | Status: DC | PRN
  Filled 2023-12-15: qty 30, 10d supply, fill #0

## 2023-12-15 MED ORDER — PREDNISONE 20 MG PO TABS
60.0000 mg | ORAL_TABLET | Freq: Every day | ORAL | 0 refills | Status: DC
  Filled 2023-12-15: qty 5, 1d supply, fill #0

## 2023-12-15 NOTE — Patient Care Conference (Signed)
Inpatient RehabilitationTeam Conference and Plan of Care Update Date: 12/13/2023   Time: 09:02 AM    Patient Name: Sharon Arias      Medical Record Number: [id number]  Date of Birth: Dec 04, 1961 Sex: Female         Room/Bed: 4W03C/4W03C-01 Payor Info: Payor: [employer]vertising copy[employer] MEDICARE / Plan: [facility] MEDICARE / Product Type: *No Product type* /    Admit Date/Time:  12/02/2023  2:29 PM  Primary Diagnosis:  Ischemic cerebrovascular accident (CVA) [facility])  Hospital Problems: Principal Problem:   Ischemic cerebrovascular accident (CVA) [facility])    Expected Discharge Date: Expected Discharge Date: 12/15/23  Team Members Present: Physician leading conference: Dr. Claudette Laws Social Worker Present: Lavera Guise, BSW Nurse Present: Chana Bode, RN PT Present: Midge Minium, PT OT Present: Mariann Barter, OT SLP Present: Feliberto Gottron, SLP PPS Coordinator present : Fae Pippin, SLP     Current Status/Progress Goal Weekly Team Focus  Bowel/Bladder      Oliguric: ESRD/HD Continent of bowel    Continent of bowel and bladder      Swallow/Nutrition/ Hydration               ADL's   Supervision to mod I overall for BADLs, close supervision for functional tranfers uing rollator   mod I goals   BADL retraining, Pt education on energy conservation strategies, fall prevention, activity modifications, AD, oculormotor exercises; Pt able to implement EC and fall prevention techniques mod I, very good safety awareness; Barriers-- poor sleep 2/2 dyalisis schedule, activity tolerance, dizziness (improved, although still present)    Mobility   supervision to modified independent with rollator for transfers, gait, and supervision for stairs and car. Limited endurance/fatigue due to HD schedule   mod I/supervision ambulatory overall with rollator  continuing to focus on endurance, vestibular exercises, and functional dynamic balance and preparation for d/c    Communication                 Safety/Cognition/ Behavioral Observations               Pain     N/A           Skin      N/A           Discharge Planning:  Discharged moved to 12/26.   Team Discussion: Patient post CVA with Bell's Palsy dx.; increased facial droop.   Patient on target to meet rehab goals: yes, currently mod I for ADLs and mobility. Limited by fatigue post HD.  *See Care Plan and progress notes for long and short-term goals.   Revisions to Treatment Plan:  N/a   Teaching Needs: Safety, medications, diet modifications, transfers, etc.   Current Barriers to Discharge: Decreased caregiver support and Hemodialysis  Possible Resolutions to Barriers: Family education HH follow up services No DME     Medical Summary Current Status: Increased facial droop, dizziness improved ,  Barriers to Discharge: Uncontrolled Diabetes;Renal Insufficiency/Failure   Possible Resolutions to [employer] Focus: diabetic management  more difficult due to high dose steroids , need to coordinate with HD   Continued Need for Acute Rehabilitation Level of Care: The patient requires daily medical management by a physician with specialized training in physical medicine and rehabilitation for the following reasons: Direction of a multidisciplinary physical rehabilitation program to maximize functional independence : Yes Medical management of patient stability for increased activity during participation in an intensive rehabilitation regime.: Yes Analysis of laboratory values and/or radiology reports with any subsequent  need for medication adjustment and/or medical intervention. : Yes   I attest that I was present, lead the team conference, and concur with the assessment and plan of the team.   Chana Bode B 12/15/2023, 8:27 AM

## 2023-12-15 NOTE — Progress Notes (Signed)
Pt to d/c today. Contacted [facility] and spoke to [employer]. Clinic can treat pt today and pt can arrive anytime between 11:00-12:00 to have treatment. Renal NP and pt made aware of this info and agreeable to HD at clinic today after d/c. Clinic has access to [facility] and can obtain needed clinicals.   Olivia Canter Renal Navigator [phone]

## 2023-12-15 NOTE — Progress Notes (Signed)
PROGRESS NOTE   Subjective/Complaints:  Discussed steroid effect on CBGs , pt sees endocrine as an OP   ROS:  + Left facial droop, generalized fatigue Patient denies any shortness of breath, chest pain, or leg swelling.  Patient does endorse some dizziness.   Objective:   No results found.  Recent Labs    12/13/23 1341  WBC 16.6*  HGB 10.4*  HCT 31.1*  PLT 258   Recent Labs    12/13/23 1341  NA 136  K 4.7  CL 97*  CO2 22  GLUCOSE 272*  BUN 59*  CREATININE 5.02*  CALCIUM 10.0    Intake/Output Summary (Last 24 hours) at 12/15/2023 0728 Last data filed at 12/14/2023 1812 Gross per 24 hour  Intake 720 ml  Output --  Net 720 ml        Physical Exam: Vital Signs Blood pressure (!) 132/53, pulse (!) 55, temperature 98.9 F (37.2 C), resp. rate 16, height 5\' 7"  (1.702 m), weight 111.2 kg, SpO2 99%.     General: No acute distress.  Sitting up at bedside.+ Obese Mood and affect are appropriate Heart: Regular rate and rhythm no rubs murmurs or extra sounds Lungs: Clear to auscultation, breathing unlabored, no rales or wheezes Abdomen: Positive bowel sounds, soft nontender to palpation, nondistended Extremities: No clubbing, cyanosis, or edema Skin: No evidence of breakdown, no evidence of rash Neurologic:  Awake, alert, and oriented x 4.  Mild dysarthria, otherwise no apparent aphasia, cognitively intact. Mod CN 7 palsy,  Cranial nerves: Left lower eyelid droop, unable to fully close eye.  Left facial droop.  Otherwise, intact. motor strength is 5/5 in bilateral deltoid, bicep, tricep, grip, hip flexor, knee extensors, ankle dorsiflexor and plantar flexor-unchanged from prior exams 12-26   Musculoskeletal: Full range of motion in all 4 extremities. No joint swelling       Assessment/Plan: 1. Functional deficits which require 3+ hours per day of interdisciplinary therapy in a comprehensive  inpatient rehab setting. Physiatrist is providing close team supervision and 24 hour management of active medical problems listed below. Physiatrist and rehab team continue to assess barriers to discharge/monitor patient progress toward functional and medical goals  Care Tool:  Bathing    Body parts bathed by patient: Right arm, Left upper leg, Right lower leg, Left arm, Chest, Left lower leg, Abdomen, Face, Front perineal area, Buttocks, Right upper leg         Bathing assist Assist Level: Independent with assistive device     Upper Body Dressing/Undressing Upper body dressing   What is the patient wearing?: Pull over shirt    Upper body assist Assist Level: Independent with assistive device    Lower Body Dressing/Undressing Lower body dressing      What is the patient wearing?: Underwear/pull up, Pants     Lower body assist Assist for lower body dressing: Independent with assitive device     Toileting Toileting    Toileting assist Assist for toileting: Independent with assistive device     Transfers Chair/bed transfer  Transfers assist     Chair/bed transfer assist level: Independent with assistive device Chair/bed transfer assistive device: Armrests, Environmental health practitioner  Ambulation   Ambulation assist      Assist level: Independent with assistive device Assistive device: Rollator Max distance: 233ft   Walk 10 feet activity   Assist     Assist level: Independent with assistive device Assistive device: Rollator   Walk 50 feet activity   Assist    Assist level: Independent with assistive device Assistive device: Rollator    Walk 150 feet activity   Assist Walk 150 feet activity did not occur: Safety/medical concerns  Assist level: Independent with assistive device Assistive device: Rollator    Walk 10 feet on uneven surface  activity   Assist     Assist level: Independent with assistive device Assistive device: Rollator    Wheelchair     Assist Is the patient using a wheelchair?: No Type of Wheelchair: Manual    Wheelchair assist level: Dependent - Patient 0%      Wheelchair 50 feet with 2 turns activity    Assist        Assist Level: Dependent - Patient 0%   Wheelchair 150 feet activity     Assist      Assist Level: Dependent - Patient 0%   Blood pressure (!) 132/53, pulse (!) 55, temperature 98.9 F (37.2 C), resp. rate 16, height 5\' 7"  (1.702 m), weight 111.2 kg, SpO2 99%.  Medical Problem List and Plan: 1. Functional deficits secondary to ischemic infarct left brachium pontis secondary to small vessel disease Increased facial droop, discussed with Vascular neuro who rec MRI , to see if CVA extension , symptoms are limited to anatomic area of previous infarct and are limited to facial droop, differential includes Bell's Palsy             -patient may  shower             -ELOS/Goals: d/c in am     2.  Antithrombotics: -DVT/anticoagulation:  Pharmaceutical: Heparin             -antiplatelet therapy: Aspirin 81 mg daily and Plavix 75 mg daily as prior to admission 3. Pain Management: Tylenol as needed  12/14- denies pain- con't tlyenol prn  4. Mood/Behavior/Sleep: Provide emotional support             -antipsychotic agents: N/A 5. Neuropsych/cognition: This patient is capable of making decisions on her own behalf. 6. Skin/Wound Care: Routine skin checks 7. Fluids/Electrolytes/Nutrition: Routine in and outs with follow-up chemistries  12/23: Labs stable 8.  End-stage renal disease.  HD today.  Continue HD per nephrology 9.  CAD with CABG 2013.  No chest pain or shortness of breath.  Continue aspirin and Plavix 10.  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  Hemoglobin A1c 8.1. Novolog 20 units 3 times daily with meals. Semglee 63 units nightly.  Blood glucose does continue to measure well, did have fasting of 90 today, we will keep an eye on this.  If need  to, can decrease insulin.  12-23: Blood sugars relatively well-controlled.  Continue current regimen.   CBG (last 3)  Recent Labs    12/14/23 1628 12/14/23 2048 12/15/23 0602  GLUCAP 302* 287* 172*  Poor control due to steroids , increase semglee, cont SSI, will need to reduce semglee once she finishes steroids on Monday ( home dose of Semglee is 75U and sees Endocrine at [facility] clinic, will call to schedule appt)   11.  Hypertension.  Cozaar 100 mg daily, hydralazine 50 mg twice daily, Norvasc 5 mg daily,  Lopressor 50 mg twice daily.  Scopolamine patch was removed, blood pressure did elevate into the 150s.  Given patient having dialysis today, will hold off on increasing antihypertensive medications at this time.   Vitals:   12/14/23 1959 12/15/23 0439  BP: (!) 162/59 (!) 132/53  Pulse: 67 (!) 55  Resp: 16 16  Temp: 98.4 F (36.9 C) 98.9 F (37.2 C)  SpO2: 98% 99%  Controlled this am 12/26  12.  Hyperlipidemia.  Lipitor 13.  Diastolic congestive heart failure.  Lasix 40 mg Tuesday Thursday Saturday Sunday.  No signs of volume overload. 14.  History of gout.  Zyloprim 300 mg daily.  Monitor for any gout flares, no concern for gout flare at this time. 15.Obesity.  BMI 39.50.  Dietary follow-up 16.  Anemia of chronic disease.  No acute concerns at this time.  Hemoglobin slightly decreased, but no concerns for bleeding.  17. Constipation, no bowel movement yesterday, this makes 2 days with no bowel movement.  If need be, can give patient stool softener.  Will add on as needed.  - Last bowel movement 12-21, not viewed.  18.  Diabetic retinopathy f/u optho   19.  Dizziness: Likely secondary to stroke.  Dizziness is improving slowly.   Will continue with therapy.  20.  Acute Cystitis: UA showing evidence of pyuria.  Today 4 of 5 of cephalexin  21.  Left facial droop -likely Bell's palsy.- f/u neuro  -MRI brain 12-22 without new lesions/changes.  12-23: No improvement since  Friday, neurology consulted, agree with tentative diagnosis of Bell's palsy and starting acyclovir and prednisone-appreciate their assistance and recommendations.   - Adding ophthalmic ointment for left eye nightly.  Inquiring into nursing taping for lower eyelid. 22.  Leukocytosis- due to steroids, afeb LOS: 13 days A FACE TO FACE EVALUATION WAS PERFORMED  Erick Colace 12/15/2023, 7:28 AM

## 2023-12-15 NOTE — Progress Notes (Signed)
Inpatient Rehabilitation Discharge Medication Review by a Pharmacist  A complete drug regimen review was completed for this patient to identify any potential clinically significant medication issues.  High Risk Drug Classes Is patient taking? Indication by Medication  Antipsychotic No   Anticoagulant No   Antibiotic No   Opioid No   Antiplatelet Yes Plavix, bASA - stroke  Hypoglycemics/insulin Yes Lantus, Novolog - T2DM  Vasoactive Medication Yes Amlodipine, hydralazine, losartan, metoprolol - HTN Lasix - prn swelling  Chemotherapy No   Other Yes Acyclovir - Bell's Palsy (EOT 12/21/23) Prednisone - Bell's Palsy (EOT 12/18/23) Allopurinol - gout  Meclizine - prn dizziness Renvela - hyperphosphatemia Atorvastatin - HLD     Type of Medication Issue Identified Description of Issue Recommendation(s)  Drug Interaction(s) (clinically significant)     Duplicate Therapy     Allergy     No Medication Administration End Date     Incorrect Dose     Additional Drug Therapy Needed     Significant med changes from prior encounter (inform family/care partners about these prior to discharge).    Other       Clinically significant medication issues were identified that warrant physician communication and completion of prescribed/recommended actions by midnight of the next day:  No  Name of provider notified for urgent issues identified:   Provider Method of Notification:    Pharmacist comments:   Time spent performing this drug regimen review (minutes):  20  Rexford Maus, PharmD, BCPS 12/15/2023 7:24 AM

## 2023-12-15 NOTE — Progress Notes (Addendum)
[facility] Progress Note   Subjective: Being discharged today. She tells me that her next OP HD is Saturday however she is MWF [facility]. Last HD 12/13/2023. Have asked renal navigator to check on this issue. She says she feels much better, now able to use walker. Denies SOB.   Objective Vitals:   12/14/23 1533 12/14/23 1959 12/15/23 0439 12/15/23 0500  BP: (!) 147/54 (!) 162/59 (!) 132/53   Pulse: 65 67 (!) 55   Resp: 18 16 16    Temp:  98.4 F (36.9 C) 98.9 F (37.2 C)   TempSrc:  Oral    SpO2: 98% 98% 99%   Weight:    111.2 kg  Height:       Physical Exam General: Chronically ill appearing female in NAD Heart: S1,S2 RRR No M/R/G Lungs: CTAB A/P  Abdomen: obese, NABS Extremities:No LE edema Dialysis Access: L AVF + T/B   Additional Objective Labs: Basic Metabolic Panel: Recent Labs  Lab 12/09/23 2008 12/11/23 0744 12/13/23 1341  NA 142 141 136  K 4.3 4.1 4.7  CL 103 100 97*  CO2 22 28 22   GLUCOSE 179* 164* 272*  BUN 59* 43* 59*  CREATININE 4.81* 4.61* 5.02*  CALCIUM 9.7 9.6 10.0  PHOS 5.5* 5.8* 4.0   Liver Function Tests: Recent Labs  Lab 12/09/23 2008 12/11/23 0744 12/13/23 1341  ALBUMIN 3.6 3.5 3.9   No results for input(s): "LIPASE", "AMYLASE" in the last 168 hours. CBC: Recent Labs  Lab 12/09/23 2008 12/13/23 1341  WBC 11.5* 16.6*  NEUTROABS 7.3  --   HGB 10.5* 10.4*  HCT 31.2* 31.1*  MCV 92.0 92.0  PLT 234 258   Blood Culture    Component Value Date/Time   SDES BLOOD BLOOD LEFT ARM 05/20/2020 1238   SDES BLOOD RIGHT ANTECUBITAL 05/20/2020 1238   SPECREQUEST  05/20/2020 1238    BOTTLES DRAWN AEROBIC AND ANAEROBIC Blood Culture adequate volume   SPECREQUEST  05/20/2020 1238    BOTTLES DRAWN AEROBIC AND ANAEROBIC Blood Culture results may not be optimal due to an excessive volume of blood received in culture bottles   CULT  05/20/2020 1238    NO GROWTH 5 DAYS Performed at [facility], [address] [place], [place] [address]    CULT  05/20/2020 1238    NO GROWTH 5 DAYS Performed at [facility], [address] [place], [place] [address]    REPTSTATUS 05/25/2020 FINAL 05/20/2020 1238   REPTSTATUS 05/25/2020 FINAL 05/20/2020 1238    Cardiac Enzymes: No results for input(s): "CKTOTAL", "CKMB", "CKMBINDEX", "TROPONINI" in the last 168 hours. CBG: Recent Labs  Lab 12/14/23 0546 12/14/23 1114 12/14/23 1628 12/14/23 2048 12/15/23 0602  GLUCAP 355* 234* 302* 287* 172*   Iron Studies: No results for input(s): "IRON", "TIBC", "TRANSFERRIN", "FERRITIN" in the last 72 hours. @lablastinr3 @ Studies/Results: No results found. Medications:   acyclovir  200 mg Oral BID   allopurinol  300 mg Oral Daily   amLODipine  5 mg Oral QHS   artificial tears   Left Eye QHS   aspirin  81 mg Oral Daily   atorvastatin  80 mg Oral Daily   Chlorhexidine Gluconate Cloth  6 each Topical Q0600   Chlorhexidine Gluconate Cloth  6 each Topical Q0600   clopidogrel  75 mg Oral Daily   hydrALAZINE  50 mg Oral BID   insulin aspart  0-9 Units Subcutaneous TID WC   insulin aspart  20 Units Subcutaneous TID  WC   insulin glargine-yfgn  66 Units Subcutaneous QHS   losartan  100 mg Oral Daily   metoprolol tartrate  50 mg Oral BID   multivitamin  1 tablet Oral QHS   predniSONE  60 mg Oral Q breakfast   sevelamer carbonate  800 mg Oral TID WC     Dialysis Orders: MWF - [facility] 3:45hr, 400/800, EDW 115kg, 2K/2.5Ca bath, LUE AVF, 15g - unclear VDRA, ESA, heparin doses.   Assessment/Plan: Debility: In CIR, therapy going well. S/p L pons CVA. Weakness + double vision. On Plavix/ASA/statin.  Worsened L sided facial droop - MRI no new acuity ESRD: Usual MWF schedule; limited dialysis Tuesday with 1 L net UF.  Appears patient may be discharged soon and can receive outpatient dialysis on Friday HTN/volume: BP elevated, on amlodipine 5mg  every day, losartan 100mg , metoprolol 50mg  BID,  hydralazine 50mg  BID.  Trace edema. Still urinates. Does not usually require any UF with HD.  Monitor closely. Anemia of ESRD: Hgb 10.5 - no ESA for now. Secondary HPTH: Ca/Phos ok - continue sevelamer as binder. Nutrition: Alb improving, on protein supp T2DM: Insulin, per primary. CAD, Hx CABG   Disposition: Renal navigator called [facility] spoke to Will, Charity fundraiser. She will have HD today at OP clinic. She has been instructed to arrive by 1200 N.  Appreciate Renal Navigator Olivia Canter assistance in making arrangements for pt.  Called report to Will, Charity fundraiser at [employer]. Lucerito Rosinski NP-C 12/15/2023, 9:46 AM  [employer] [phone]

## 2023-12-27 ENCOUNTER — Encounter: Payer: Medicare Other | Admitting: Physical Medicine & Rehabilitation

## 2024-01-24 NOTE — Progress Notes (Signed)
 Sharon Arias is a  63 y.o. female who presents for  CHIEF COMPLAINT Chief Complaint  Patient presents with  . Follow-up  . Hypertension  . Hyperlipidemia  . Diabetes  . End Stage Kidney Disease    Subjective: History of Present Illness  Pt in NAD. HTN stable on meds. Has HLD on statin and DM on insulin . Also with ESRD on HD and chronic anemia. Weight stable. Had a stroke 1 mo ago. Seeing Neuro next week. No fever or HA's. Left facial weakness persists. Denies CP or SOB. No palpitations. No dysphagia.    Past Medical History:  Diagnosis Date  . Anemia   . Aortic atherosclerosis (CMS-HCC)    on CT imaging 11/2018  . Arrhythmia   . Arthritis   . CAD (coronary artery disease)   . Chickenpox   . Chronic kidney disease   . CVA (cerebrovascular accident) (CMS/HHS-HCC)    ? cerebellar; no residual  . Diabetic neuropathy (CMS/HHS-HCC)   . Diabetic retinopathy (CMS/HHS-HCC)    legally blind  . Diverticulosis 01/27/2016  . Fournier's gangrene in female (CMS/HHS-HCC) 11/2018  . Gout   . Hepatic steatosis    on CT imaging 11/2018  . History of cataract   . Hyperlipidemia   . Hypertension   . Iron deficiency anemia   . MI (myocardial infarction) (CMS/HHS-HCC) 07/12/2012  . Multiple thyroid  nodules   . Obesity   . PONV (postoperative nausea and vomiting)    with all surgeries   . Secondary hyperparathyroidism of renal origin (CMS/HHS-HCC)   . Sleep apnea   . Tubular adenoma of colon 01/27/2016  . Type 2 diabetes mellitus (CMS/HHS-HCC)    Patient Active Problem List  Diagnosis  . Wears glasses  . Myocardial infarction (CMS/HHS-HCC)  . HTN (hypertension)  . Hyperlipidemia  . Obesity  . Postmenopausal  . Coronary artery disease  . Wound infection after surgery  . Type 2 diabetes mellitus (CMS/HHS-HCC)  . Onychomycosis  . Pain in foot  . Peripheral neuropathy  . Diabetic macular edema of both eyes (CMS-HCC)  . Proliferative diabetic retinopathy, both eyes (CMS/HHS-HCC)   . Vitreous hemorrhage of right eye (CMS/HHS-HCC)  . Iris neovascularization  . Diabetic neuropathy (CMS-HCC)  . PDR (proliferative diabetic retinopathy) (CMS/HHS-HCC)  . Plantar fascial fibromatosis  . Essential hypertension, benign  . Other and unspecified hyperlipidemia  . Type II or unspecified type diabetes mellitus without mention of complication, uncontrolled (CMS-HCC)  . Anemia of chronic disease  . Proliferative diabetic retinopathy with macular edema associated with type 2 diabetes mellitus (CMS/HHS-HCC)  . ERM OS (epiretinal membrane, left eye)  . Well controlled type 2 diabetes mellitus (CMS/HHS-HCC)  . Mild obesity, unspecified  . Lumbar stenosis with neurogenic claudication  . DDD (degenerative disc disease), lumbar  . Lumbar radiculitis  . Abnormal stress test  . AKI (acute kidney injury) (CMS-HCC)  . Anemia in chronic kidney disease  . Chronic arthritis  . CKD (chronic kidney disease), stage IV (CMS/HHS-HCC)  . Pseudophakia of right eye  . Pseudophakia of left eye  . Chronic gouty arthritis  . High risk medication use  . OSA on CPAP  . Lower extremity edema  . History of stroke  . Aortic atherosclerosis (CMS-HCC)  . Type 2 diabetes mellitus (CMS/HHS-HCC)  . Multiple thyroid  nodules  . Type 2 diabetes mellitus with hyperlipidemia  (CMS/HHS-HCC)  . Type 2 diabetes mellitus with obesity  (CMS/HHS-HCC)  . Diabetes mellitus type 2 in obese (CMS/HHS-HCC)  . Type 2 diabetes mellitus  with vascular disease (CMS/HHS-HCC)  . Uncontrolled type 2 diabetes mellitus with hyperglycemia (CMS/HHS-HCC)  . DM type 2 with diabetic peripheral neuropathy (CMS/HHS-HCC)  . Type II diabetes mellitus with complication (CMS/HHS-HCC)  . Uncontrolled secondary diabetes mellitus with stage 4 CKD (GFR 15-29)  . Lymphedema of both lower extremities  . BMI 40.0-44.9, adult (CMS/HHS-HCC)  . Chronic kidney disease requiring chronic dialysis (CMS/HHS-HCC)  . Heart palpitations    Past  Surgical History:  Procedure Laterality Date  . CORONARY ARTERY BYPASS GRAFT  06/2012  . CORONARY ARTERY BYPASS W/VENOUS & ARTERIAL GRAFTS  07/17/2012   TMR also performed at that time  . INCISION & DRAINAGE ABSCESS CHEST N/A 08/17/2012   Procedure: INCISION & DRAINAGE of sternal incision;  Surgeon: Lamar Royal Nicholaus Mickey., MD;  Location: [facility];  Service: Cardiothoracic;  Laterality: N/A;  . APPLICATION WOUND VAC N/A 08/17/2012   Procedure: APPLICATION WOUND VAC;  Surgeon: Lamar Royal Nicholaus Mickey., MD;  Location: [facility];  Service: Cardiothoracic;  Laterality: N/A;  . REPAIR COMPLEX RETINAL DETACHMENT Right 03/25/2015   Procedure: REPAIR COMPLEX RETINAL DETACHMENT WITH VITRECTOMY 23G;  Surgeon: Cynthia Claybon Gee, MD;  [facility];  Service: Ophthalmology;  Laterality: Right;  diabetic on insulin   . COLONOSCOPY  01/27/2016   Diverticulosis/Tubular adenoma/Repeat 27yrs/MUS  . CATARACT EXTRACTION  2018  . EXTRACTION CATARACT EXTRACAPSULAR W/INSERTION INTRAOCULAR PROSTHESIS Right 04/18/2017   Procedure: LRI/ ORA-------Cataract extraction with limbal relaxing incisions and intraocular lens implant;  Surgeon: Grayce Donnamarie Bowl, MD;  [facility];  Service: Ophthalmology;  Laterality: Right;  plan on LRI needs injection with Dr. Rutherford prior to surgery  . EXTRACTION CATARACT EXTRACAPSULAR W/INSERTION INTRAOCULAR PROSTHESIS Left 06/06/2017   Procedure: Cataract extraction with intraocular lens implant;  Surgeon: Grayce Donnamarie Bowl, MD;  [facility];  Service: Ophthalmology;  Laterality: Left;  needs injection prior to surgery  . OTHER SURGERY  01/2021   surgery to place a catheter that goes into her right atrium  . a/v fistulagram  04/21/2021  . CREATION DISTAL ARTERIOVENOUS FISTULA DURING LOWER EXTREMITY BYPASS SURGERY       Current Outpatient Medications:  .  acetaminophen  (TYLENOL  8 HOUR ORAL), Take by mouth as needed, Disp: , Rfl:  .   allopurinoL  (ZYLOPRIM ) 300 MG tablet, take 1 tablet by mouth daily, Disp: 10 tablet, Rfl: 35 .  amLODIPine  (NORVASC ) 10 MG tablet, Take 10 mg by mouth nightly  , Disp: , Rfl:  .  aspirin  81 MG EC tablet, Take 81 mg by mouth daily., Disp: , Rfl:  .  clopidogreL  (PLAVIX ) 75 mg tablet, TAKE 1 TABLET BY MOUTH ONCE  DAILY, Disp: 100 tablet, Rfl: 2 .  FUROsemide  (LASIX ) 40 MG tablet, Take 25 mg by mouth once daily as needed (tuesday, thursday, saturday and sunday (non dialysis days)), Disp: , Rfl:  .  hydrALAZINE  (APRESOLINE ) 25 MG tablet, Take 1 tablet (25 mg total) by mouth 2 (two) times daily (Patient taking differently: Take 50 mg by mouth 2 (two) times daily), Disp: 180 tablet, Rfl: 3 .  insulin  DEGLUDEC (TRESIBA  FLEXTOUCH U-200) pen injector (concentration 200 units/mL), Inject 74 Units subcutaneously once daily, Disp: 36 mL, Rfl: 3 .  insulin  LISPRO (HUMALOG  KWIKPEN INSULIN ) pen injector (concentration 100 units/mL), INJECT SUBCUTANEOUSLY UP TO 120 UNITS DAILY IN DIVIDED DOSES, AS PRESCRIBED, Disp: 120 mL, Rfl: 1 .  losartan  (COZAAR ) 100 MG tablet, Take 100 mg by mouth once daily, Disp: , Rfl:  .  meclizine  (  ANTIVERT ) 25 mg tablet, Take 25 mg by mouth once daily as needed for Dizziness, Disp: , Rfl:  .  metoprolol  tartrate (LOPRESSOR ) 50 MG tablet, TAKE 1 TABLET BY MOUTH TWICE  DAILY, Disp: 200 tablet, Rfl: 2 .  RENA-VITE 0.8 mg, once daily, Disp: , Rfl:  .  rosuvastatin  (CRESTOR ) 40 MG tablet, TAKE 1 TABLET BY MOUTH ONCE  DAILY, Disp: 100 tablet, Rfl: 2 .  sevelamer  carbonate (RENVELA ) 800 mg tablet, Take 800 mg by mouth 3 (three) times daily, Disp: , Rfl:   Dulaglutide  Social History   Socioeconomic History  . Marital status: Married  . Number of children: 3  Occupational History  . Occupation: On disability  Tobacco Use  . Smoking status: Never  . Smokeless tobacco: Never  Vaping Use  . Vaping status: Never Used  Substance and Sexual Activity  . Alcohol  use: Never    Alcohol /week:  0.0 standard drinks of alcohol   . Drug use: No  . Sexual activity: Yes    Partners: Male    Birth control/protection: Post-menopausal   Social Drivers of Health   Food Insecurity: No Food Insecurity (12/07/2023)   Received from [facility]   Hunger Vital Sign   . Worried About Programme Researcher, Broadcasting/film/video in the Last Year: Never true   . Ran Out of Food in the Last Year: Never true  Transportation Needs: No Transportation Needs (12/07/2023)   Received from [facility] - Transportation   . Lack of Transportation (Medical): No   . Lack of Transportation (Non-Medical): No  Housing Stability: Unknown (12/07/2023)   Received from [facility] Stability Vital Sign   . Unable to Pay for Housing in the Last Year: No   . Homeless in the Last Year: No    Family History  Problem Relation Name Age of Onset  . Diabetes Mother Dominic DelCorso   . Breast cancer Mother Dominic DelCorso   . Multiple sclerosis Mother Dominic DelCorso   . Coronary Artery Disease (Blocked arteries around heart) Mother Dominic DelCorso   . Prostate cancer Mother Dominic DelCorso   . Diabetes Brother    . Heart disease Brother    . Diabetes Maternal Grandmother    . Diabetes Maternal Grandfather    . Myocardial Infarction (Heart attack) Father Dominic DelCorso   . High blood pressure (Hypertension) Father Dominic DelCorso   . Bone cancer Father Dominic DelCorso   . Cancer Father Dominic DelCorso        lung  . Glaucoma Neg Hx    . Macular degeneration Neg Hx    . Anesthesia problems Neg Hx    . Malignant hyperthermia Neg Hx      A comprehensive ROS was negative except for HPI  PE: BP (!) 154/68   Pulse 66   Ht 165.1 cm (5' 5)   Wt (!) 116.6 kg (257 lb)   SpO2 95%   BMI 42.77 kg/m  General: Alert oriented x3   Eyes: Sclera and conjunctiva clear; pupils equal round and reactive to light and accommodation; extraocular movements intact.   Nose: Mucosa healthy without drainage or  ulceration Oropharynx: No suspicious lesions Neck: No swelling, masses, stiffness, pain, limited movement, carotid pulses normal bilaterally, thyroid  normal size, no masses palpated. No bruits heard Lungs: Respirations unlabored; clear to auscultation bilaterally Back: No spinal deformity Cardiovascular: Heart regular rate and rhythm without murmurs, gallops, or rubs Abdomen: Soft; non tender; non distended;  no masses or  organomegaly Lymph Nodes: No significant cervical, supraclavicular, or axillary lymphadenopathy noted Musculoskeletal: No active joint inflammation Extremities: Normal, no edema Pulses: Dorsalis pedis palpable and symmetric bilaterally Neurologic: Alert and oriented; speech intact; left facial weakness noted; moves all extremities well    Ancillary Procedure on 06/28/2023  Component Date Value Ref Range Status  . LV Ejection Fraction (%) 06/28/2023 55   Final  . Aortic Valve Stenosis Grade 06/28/2023 none   Final  . Aortic Valve Regurgitation Grade 06/28/2023 none   Final  . Mitral Valve Stenosis Grade 06/28/2023 none   Final  . Mitral Valve Regurgitation Grade 06/28/2023 none   Final  . Tricuspid Valve Regurgitation Grade 06/28/2023 mild   Final  . Tricuspid Valve Regurgitation Max * 06/28/2023 3.7  m/sec Final  . Right Ventricle Systolic Pressure * 06/28/2023 56.6  mmHg Final  . LV End Diastolic Diameter (cm) 06/28/2023 4.5  cm Final  . LV End Systolic Diameter (cm) 06/28/2023 2.6  cm Final  . LV Septum Wall Thickness (cm) 06/28/2023 1.4  cm Final  . LV Posterior Wall Thickness (cm) 06/28/2023 0.88  cm Final  . Left Atrium Diameter (cm) 06/28/2023 4.4  cm Final  Appointment on 05/24/2023  Component Date Value Ref Range Status  . Creatinine, Random Urine 05/24/2023 93.8  37.0 - 250.0 mg/dL Final  . Urine Albumin, Random 05/24/2023 1,186    mg/L Final  . Urine Albumin/Creatinine Ratio 05/24/2023 1,264.4 (H)  <30.0 ug/mg Final  . Cholesterol, Total 05/24/2023 156   100 - 200 mg/dL Final  . Triglyceride 93/95/7975 189  35 - 199 mg/dL Final  . HDL (High Density Lipoprotein) Cho* 05/24/2023 34.9 (L)  35.0 - 85.0 mg/dL Final  . LDL Calculated 05/24/2023 83  0 - 130 mg/dL Final  . VLDL Cholesterol 05/24/2023 38  mg/dL Final  . Cholesterol/HDL Ratio 05/24/2023 4.5   Final  . WBC (White Blood Cell Count) 05/24/2023 9.3  4.1 - 10.2 10^3/uL Final  . RBC (Red Blood Cell Count) 05/24/2023 3.61 (L)  4.04 - 5.48 10^6/uL Final  . Hemoglobin 05/24/2023 11.2 (L)  12.0 - 15.0 gm/dL Final  . Hematocrit 93/95/7975 34.0 (L)  35.0 - 47.0 % Final  . MCV (Mean Corpuscular Volume) 05/24/2023 94.2  80.0 - 100.0 fl Final  . MCH (Mean Corpuscular Hemoglobin) 05/24/2023 31.0  27.0 - 31.2 pg Final  . MCHC (Mean Corpuscular Hemoglobin * 05/24/2023 32.9  32.0 - 36.0 gm/dL Final  . Platelet Count 05/24/2023 234  150 - 450 10^3/uL Final  . RDW-CV (Red Cell Distribution Widt* 05/24/2023 13.7  11.6 - 14.8 % Final  . MPV (Mean Platelet Volume) 05/24/2023 11.4  9.4 - 12.4 fl Final  . Neutrophils 05/24/2023 5.48  1.50 - 7.80 10^3/uL Final  . Lymphocytes 05/24/2023 2.45  1.00 - 3.60 10^3/uL Final  . Monocytes 05/24/2023 0.86  0.00 - 1.50 10^3/uL Final  . Eosinophils 05/24/2023 0.33  0.00 - 0.55 10^3/uL Final  . Basophils 05/24/2023 0.14 (H)  0.00 - 0.09 10^3/uL Final  . Neutrophil % 05/24/2023 58.9  32.0 - 70.0 % Final  . Lymphocyte % 05/24/2023 26.3  10.0 - 50.0 % Final  . Monocyte % 05/24/2023 9.2  4.0 - 13.0 % Final  . Eosinophil % 05/24/2023 3.5  1.0 - 5.0 % Final  . Basophil% 05/24/2023 1.5  0.0 - 2.0 % Final  . Immature Granulocyte % 05/24/2023 0.6  <=0.7 % Final  . Immature Granulocyte Count 05/24/2023 0.06  <=0.06 10^3/L Final  .  Glucose 05/24/2023 172 (H)  70 - 110 mg/dL Final  . Sodium 93/95/7975 143  136 - 145 mmol/L Final  . Potassium 05/24/2023 4.1  3.6 - 5.1 mmol/L Final  . Chloride 05/24/2023 103  97 - 109 mmol/L Final  . Carbon Dioxide (CO2) 05/24/2023 29.8  22.0 -  32.0 mmol/L Final  . Urea Nitrogen (BUN) 05/24/2023 38 (H)  7 - 25 mg/dL Final  . Creatinine 93/95/7975 3.9 (H)  0.6 - 1.1 mg/dL Final  . Glomerular Filtration Rate (eGFR) 05/24/2023 13 (L)  >60 mL/min/1.73sq m Final  . Calcium  05/24/2023 9.3  8.7 - 10.3 mg/dL Final  . AST  93/95/7975 19  8 - 39 U/L Final  . ALT  05/24/2023 22  5 - 38 U/L Final  . Alk Phos (alkaline Phosphatase) 05/24/2023 37  34 - 104 U/L Final  . Albumin 05/24/2023 4.3  3.5 - 4.8 g/dL Final  . Bilirubin, Total 05/24/2023 0.5  0.3 - 1.2 mg/dL Final  . Protein, Total 05/24/2023 7.0  6.1 - 7.9 g/dL Final  . A/G Ratio 93/95/7975 1.6  1.0 - 5.0 gm/dL Final  . Color 93/95/7975 Light Yellow  Colorless, Straw, Light Yellow, Yellow, Dark Yellow Final  . Clarity 05/24/2023 Clear  Clear Final  . Specific Gravity 05/24/2023 1.014  1.005 - 1.030 Final  . pH, Urine 05/24/2023 7.5  5.0 - 8.0 Final  . Protein, Urinalysis 05/24/2023 3+ (!)  Negative mg/dL Final  . Glucose, Urinalysis 05/24/2023 3+ (!)  Negative mg/dL Final  . Ketones, Urinalysis 05/24/2023 Negative  Negative mg/dL Final  . Blood, Urinalysis 05/24/2023 1+ (!)  Negative Final  . Nitrite, Urinalysis 05/24/2023 Negative  Negative Final  . Leukocyte Esterase, Urinalysis 05/24/2023 Negative  Negative Final  . Bilirubin, Urinalysis 05/24/2023 Negative  Negative Final  . Urobilinogen, Urinalysis 05/24/2023 0.2  0.2 - 1.0 mg/dL Final  . WBC, UA 93/95/7975 5  <=5 /hpf Final  . Red Blood Cells, Urinalysis 05/24/2023 1  <=3 /hpf Final  . Bacteria, Urinalysis 05/24/2023 0-5  0 - 5 /hpf Final  . Squamous Epithelial Cells, Urinaly* 05/24/2023 11  /hpf Final  . Thyroid  Stimulating Hormone (TSH) 05/24/2023 4.752  0.450-5.330 uIU/ml uIU/mL Final   DIAGNOSIS: High risk medication use  (primary encounter diagnosis)  Primary hypertension  Type 2 diabetes mellitus with vascular disease (CMS/HHS-HCC)  Mixed hyperlipidemia  Chronic kidney disease requiring chronic dialysis  (CMS/HHS-HCC)  Chronic gouty arthritis   PLAN: HTN- stable, same meds DM- same meds, labs today HLD- diet/exercise/statin, labs today Presumed Bell's palsy- patch eye, Neuro next week ESRD- per Nephrology Anemia- labs today RTC 3 mo, sooner I fneeded     Attestation Statement:   I personally performed the service. (TP)  Reyes JONETTA Costa, MD, MD

## 2024-02-23 ENCOUNTER — Ambulatory Visit
Admission: RE | Admit: 2024-02-23 | Discharge: 2024-02-23 | Disposition: A | Discharge status: Home / Self Care | Payer: Medicare Other | Source: Ambulatory Visit | Attending: Internal Medicine | Admitting: Internal Medicine

## 2024-02-23 DIAGNOSIS — Z1231 Encounter for screening mammogram for malignant neoplasm of breast: Secondary | ICD-10-CM | POA: Diagnosis present

## 2024-03-01 ENCOUNTER — Ambulatory Visit: Payer: Medicare Other | Admitting: Neurology

## 2024-04-04 LAB — COLOGUARD

## 2024-04-17 LAB — COLOGUARD: COLOGUARD: POSITIVE — AB

## 2024-05-08 ENCOUNTER — Encounter (INDEPENDENT_AMBULATORY_CARE_PROVIDER_SITE_OTHER): Payer: Self-pay

## 2024-05-09 ENCOUNTER — Other Ambulatory Visit (INDEPENDENT_AMBULATORY_CARE_PROVIDER_SITE_OTHER): Payer: Self-pay | Admitting: Vascular Surgery

## 2024-05-09 DIAGNOSIS — N186 End stage renal disease: Secondary | ICD-10-CM

## 2024-05-10 ENCOUNTER — Emergency Department

## 2024-05-10 ENCOUNTER — Other Ambulatory Visit: Payer: Self-pay

## 2024-05-10 ENCOUNTER — Encounter (INDEPENDENT_AMBULATORY_CARE_PROVIDER_SITE_OTHER): Payer: Self-pay

## 2024-05-10 ENCOUNTER — Encounter: Payer: Self-pay | Admitting: Emergency Medicine

## 2024-05-10 ENCOUNTER — Ambulatory Visit (INDEPENDENT_AMBULATORY_CARE_PROVIDER_SITE_OTHER): Payer: Medicare Other

## 2024-05-10 ENCOUNTER — Emergency Department: Admission: EM | Admit: 2024-05-10 | Discharge: 2024-05-10 | Disposition: A | Discharge status: Home / Self Care

## 2024-05-10 ENCOUNTER — Ambulatory Visit (INDEPENDENT_AMBULATORY_CARE_PROVIDER_SITE_OTHER): Payer: Medicare Other | Admitting: Vascular Surgery

## 2024-05-10 DIAGNOSIS — I132 Hypertensive heart and chronic kidney disease with heart failure and with stage 5 chronic kidney disease, or end stage renal disease: Secondary | ICD-10-CM | POA: Diagnosis not present

## 2024-05-10 DIAGNOSIS — I509 Heart failure, unspecified: Secondary | ICD-10-CM | POA: Insufficient documentation

## 2024-05-10 DIAGNOSIS — S0990XA Unspecified injury of head, initial encounter: Secondary | ICD-10-CM | POA: Diagnosis present

## 2024-05-10 DIAGNOSIS — E1122 Type 2 diabetes mellitus with diabetic chronic kidney disease: Secondary | ICD-10-CM | POA: Diagnosis not present

## 2024-05-10 DIAGNOSIS — Z7902 Long term (current) use of antithrombotics/antiplatelets: Secondary | ICD-10-CM | POA: Diagnosis not present

## 2024-05-10 DIAGNOSIS — Z992 Dependence on renal dialysis: Secondary | ICD-10-CM | POA: Diagnosis not present

## 2024-05-10 DIAGNOSIS — N186 End stage renal disease: Secondary | ICD-10-CM | POA: Insufficient documentation

## 2024-05-10 DIAGNOSIS — S0003XA Contusion of scalp, initial encounter: Secondary | ICD-10-CM | POA: Insufficient documentation

## 2024-05-10 DIAGNOSIS — Z951 Presence of aortocoronary bypass graft: Secondary | ICD-10-CM | POA: Insufficient documentation

## 2024-05-10 DIAGNOSIS — R55 Syncope and collapse: Secondary | ICD-10-CM | POA: Diagnosis not present

## 2024-05-10 DIAGNOSIS — I2510 Atherosclerotic heart disease of native coronary artery without angina pectoris: Secondary | ICD-10-CM | POA: Insufficient documentation

## 2024-05-10 DIAGNOSIS — W19XXXA Unspecified fall, initial encounter: Secondary | ICD-10-CM | POA: Diagnosis not present

## 2024-05-10 LAB — COMPREHENSIVE METABOLIC PANEL WITH GFR
ALT: 21 U/L (ref 0–44)
AST: 20 U/L (ref 15–41)
Albumin: 3.9 g/dL (ref 3.5–5.0)
Alkaline Phosphatase: 42 U/L (ref 38–126)
Anion gap: 12 (ref 5–15)
BUN: 34 mg/dL — ABNORMAL HIGH (ref 8–23)
CO2: 27 mmol/L (ref 22–32)
Calcium: 9.6 mg/dL (ref 8.9–10.3)
Chloride: 100 mmol/L (ref 98–111)
Creatinine, Ser: 3.88 mg/dL — ABNORMAL HIGH (ref 0.44–1.00)
GFR, Estimated: 13 mL/min — ABNORMAL LOW (ref >60)
Glucose, Bld: 273 mg/dL — ABNORMAL HIGH (ref 70–99)
Potassium: 4.5 mmol/L (ref 3.5–5.1)
Sodium: 139 mmol/L (ref 135–145)
Total Bilirubin: 0.5 mg/dL (ref 0.0–1.2)
Total Protein: 7.7 g/dL (ref 6.5–8.1)

## 2024-05-10 LAB — URINALYSIS, ROUTINE W REFLEX MICROSCOPIC
Bilirubin Urine: NEGATIVE
Glucose, UA: >=500 mg/dL — AB
Ketones, ur: NEGATIVE mg/dL
Leukocytes,Ua: NEGATIVE
Nitrite: NEGATIVE
Protein, ur: 100 mg/dL — AB
Specific Gravity, Urine: 1.009 (ref 1.005–1.030)
pH: 8 (ref 5.0–8.0)

## 2024-05-10 LAB — CBC
HCT: 36.3 % (ref 36.0–46.0)
Hemoglobin: 12.3 g/dL (ref 12.0–15.0)
MCH: 31.1 pg (ref 26.0–34.0)
MCHC: 33.9 g/dL (ref 30.0–36.0)
MCV: 91.7 fL (ref 80.0–100.0)
Platelets: 224 10*3/uL (ref 150–400)
RBC: 3.96 MIL/uL (ref 3.87–5.11)
RDW: 14.2 % (ref 11.5–15.5)
WBC: 11.1 10*3/uL — ABNORMAL HIGH (ref 4.0–10.5)
nRBC: 0 % (ref 0.0–0.2)

## 2024-05-10 NOTE — ED Triage Notes (Signed)
 Pt via POV from doctors office. Pt had a syncopal episode. Reports that she fell and hit the back of her head. Pt has large hematoma to the back of her head. Pt takes plavix . C/o headache but denies any other pain. Pt is A&OX4 and NAD. Pt is dialysis pt, MWF. Denies missing any treatment.

## 2024-05-10 NOTE — ED Provider Notes (Signed)
 [facility] Provider Note    Event Date/Time   First MD Initiated Contact with Patient 05/10/24 1410     (approximate)   History   Loss of Consciousness  Pt via POV from doctors office. Pt had a syncopal episode. Reports that she fell and hit the back of her head. Pt has large hematoma to the back of her head. Pt takes plavix . C/o headache but denies any other pain. Pt is A&OX4 and NAD. Pt is dialysis pt, MWF. Denies missing any treatment.    HPI Sharon Arias is a 63 y.o. female PMH multiple medical comorbidities including DM2, CAD, ESRD on dialysis (Monday Wednesday Friday), prior CVA,hypertension, hyperlipidemia, prior CABG, CHF [name] for evaluation after a loss of consciousness episode - She was in the vascular surgery clinic today, approached the front desk but felt lightheaded after standing.  Woke up on the ground.  Large hematoma to occiput.  Believes she was only unconscious for a few seconds.  Not on blood thinners.  Complains of headache, no neck pain/back pain.  No extremity pain. - No preceding chest pain, shortness of breath - No urinary symptoms, does make urine - No prior episodes of syncope   Per chart review, last admitted in December 2020 for for CVA     Physical Exam   Triage Vital Signs: ED Triage Vitals  Encounter Vitals Group     BP 05/10/24 1306 (!) 167/58     Systolic BP Percentile --      Diastolic BP Percentile --      Pulse Rate 05/10/24 1306 66     Resp 05/10/24 1306 18     Temp 05/10/24 1306 98.5 F (36.9 C)     Temp Source 05/10/24 1306 Oral     SpO2 05/10/24 1306 97 %     Weight 05/10/24 1304 250 lb (113.4 kg)     Height 05/10/24 1304 5\' 7"  (1.702 m)     Head Circumference --      Peak Flow --      Pain Score 05/10/24 1304 10     Pain Loc --      Pain Education --      Exclude from Growth Chart --     Most recent vital signs: Vitals:   05/10/24 1306  BP: (!) 167/58  Pulse: 66  Resp: 18  Temp: 98.5 F  (36.9 C)  SpO2: 97%   General: Awake, no distress.  HEENT: Large hematoma occiput Neck:   No midline pain CV:  Good peripheral perfusion. RRR, RP 2+ Resp:  Normal effort. CTAB Abd:  No distention. Nontender to deep palpation throughout Back:  No midline back pain Other:  No ttp throughout b/l upper and lower extremities   ED Results / Procedures / Treatments   Labs (all labs ordered are listed, but only abnormal results are displayed) Labs Reviewed  COMPREHENSIVE METABOLIC PANEL WITH GFR - Abnormal; Notable for the following components:      Result Value   Glucose, Bld 273 (*)    BUN 34 (*)    Creatinine, Ser 3.88 (*)    GFR, Estimated 13 (*)    All other components within normal limits  CBC - Abnormal; Notable for the following components:   WBC 11.1 (*)    All other components within normal limits  URINALYSIS, ROUTINE W REFLEX MICROSCOPIC  CBG MONITORING, ED     EKG  Ecg = rhythm, rate 76, no ST elevation or  depression, no significant repolarization abnormality, normal axis, normal intervals.  No evidence of ischemia or arrhythmia on my read.   RADIOLOGY CT interpreted by myself, large subdural hematoma though no underlying intracranial hemorrhage or skull fracture appreciated.  Radiology read pending.  PROCEDURES:  Critical Care performed: No  Procedures   MEDICATIONS ORDERED IN ED: Medications - No data to display   IMPRESSION / MDM / ASSESSMENT AND PLAN / ED COURSE  I reviewed the triage vital signs and the nursing notes.                              DDX/MDM/AP: Differential diagnosis includes, but is not limited to, intracranial hemorrhage or skull fracture, do not clinically suspect C-spine fracture or other traumatic injury from fall.  With regard to syncopal episode, suspect likely orthostatic hypotension contributing based on history though consider possibility of transient arrhythmia in this patient with known heart failure.    Plan: - Labs -  EKG Cardiac monitor - CT head - Patient declines need for pain medication - N.p.o. - Reassess  Patient's presentation is most consistent with acute presentation with potential threat to life or bodily function.  The patient is on the cardiac monitor to evaluate for evidence of arrhythmia and/or significant heart rate changes.  ED course below.  Laboratory workup unremarkable.  Patient signed out to oncoming ED provider pending results of CT head and orthostatic vital signs.       FINAL CLINICAL IMPRESSION(S) / ED DIAGNOSES   Final diagnoses:  Syncope, unspecified syncope type  Traumatic injury of head with hematoma of scalp     Rx / DC Orders   ED Discharge Orders     None        Note:  This document was prepared using Dragon voice recognition software and may include unintentional dictation errors.   Collis Deaner, MD 05/10/24 [phone]

## 2024-05-24 ENCOUNTER — Encounter: Payer: Self-pay | Admitting: Gastroenterology

## 2024-05-29 ENCOUNTER — Encounter: Payer: Self-pay | Admitting: Gastroenterology

## 2024-06-14 ENCOUNTER — Encounter: Payer: Self-pay | Admitting: Anesthesiology

## 2024-06-14 ENCOUNTER — Other Ambulatory Visit: Payer: Self-pay

## 2024-06-14 ENCOUNTER — Encounter
Admission: RE | Disposition: A | Discharge status: Home / Self Care | Payer: Self-pay | Source: Home / Self Care | Attending: Gastroenterology

## 2024-06-14 ENCOUNTER — Encounter: Payer: Self-pay | Admitting: Gastroenterology

## 2024-06-14 ENCOUNTER — Ambulatory Visit
Admission: RE | Admit: 2024-06-14 | Discharge: 2024-06-14 | Disposition: A | Discharge status: Home / Self Care | Attending: Gastroenterology | Admitting: Gastroenterology

## 2024-06-14 DIAGNOSIS — R195 Other fecal abnormalities: Secondary | ICD-10-CM | POA: Insufficient documentation

## 2024-06-14 DIAGNOSIS — Z539 Procedure and treatment not carried out, unspecified reason: Secondary | ICD-10-CM | POA: Insufficient documentation

## 2024-06-14 DIAGNOSIS — Z1211 Encounter for screening for malignant neoplasm of colon: Secondary | ICD-10-CM | POA: Insufficient documentation

## 2024-06-14 HISTORY — PX: COLONOSCOPY: SHX5424

## 2024-06-14 HISTORY — DX: Secondary hyperparathyroidism of renal origin: N25.81

## 2024-06-14 HISTORY — DX: Chronic kidney disease, stage 5: N18.5

## 2024-06-14 HISTORY — DX: Unspecified diastolic (congestive) heart failure: I50.30

## 2024-06-14 SURGERY — COLONOSCOPY
Anesthesia: General

## 2024-06-14 MED ORDER — SODIUM CHLORIDE 0.9 % IV SOLN: INTRAVENOUS | Status: DC

## 2024-06-14 MED ORDER — LIDOCAINE HCL (PF) 2 % IJ SOLN
INTRAMUSCULAR | Status: AC
  Filled 2024-06-14: qty 5

## 2024-06-14 MED ORDER — PROPOFOL 1000 MG/100ML IV EMUL
INTRAVENOUS | Status: AC
  Filled 2024-06-14: qty 100

## 2024-06-14 NOTE — H&P (Signed)
 Patient was only able to drink half of her prep overnight.  Given her Cologuard positive status, and hemodialysis and binders with history of constipation and poor preps, the colonoscopy will be deferred to another date when she can appropriately complete her prep.  Elspeth EMERSON Jungling, DO [facility]

## 2024-06-14 NOTE — Anesthesia Preprocedure Evaluation (Signed)
 Anesthesia Evaluation  Patient identified by MRN, date of birth, ID band Patient awake and Patient confused    Reviewed: Allergy & Precautions, NPO status , Patient's Chart, lab work & pertinent test results  Airway Mallampati: III  TM Distance: >3 FB Neck ROM: full    Dental  (+) Teeth Intact   Pulmonary neg pulmonary ROS, shortness of breath and with exertion, sleep apnea and Continuous Positive Airway Pressure Ventilation    Pulmonary exam normal  + decreased breath sounds      Cardiovascular Exercise Tolerance: Poor hypertension, Pt. on medications + CAD, + Past MI, + CABG and +CHF  negative cardio ROS Normal cardiovascular exam Rhythm:Regular Rate:Normal     Neuro/Psych   Anxiety     CVA negative neurological ROS  negative psych ROS   GI/Hepatic negative GI ROS, Neg liver ROS,,,  Endo/Other  negative endocrine ROSdiabetes, Type 1, Insulin  Dependent  Class 4 obesity  Renal/GU DialysisRenal diseasenegative Renal ROS  negative genitourinary   Musculoskeletal   Abdominal  (+) + obese  Peds  Hematology negative hematology ROS (+) Blood dyscrasia, anemia   Anesthesia Other Findings Past Medical History: 05/19/2016: Anemia in chronic kidney disease No date: Anxiety No date: Aortic atherosclerosis (HCC) No date: Blind No date: CAD (coronary artery disease) No date: Cardiac murmur No date: CHF (congestive heart failure) (HCC)     Comment:  G2DD on 02/2020 TTE No date: Chicken pox No date: Chronic anticoagulation No date: CVA (cerebral vascular accident) (HCC) No date: DDD (degenerative disc disease), lumbar No date: Detached retina No date: Diabetic retinopathy (HCC)     Comment:  legally blind No date: Diastolic congestive heart failure (HCC) No date: Dyspnea No date: ESRD needing dialysis (HCC) No date: Hx of CABG No date: Hyperlipidemia No date: Hypertension 06/2012: NSTEMI (non-ST elevated myocardial  infarction) (HCC) No date: Obesity No date: Pneumonia No date: PONV (postoperative nausea and vomiting) No date: Sciatica of right side No date: Secondary hyperparathyroidism of renal origin (HCC) No date: Sleep apnea     Comment:  CPAP NIGHTLY No date: Stage 5 chronic kidney disease (HCC) No date: T2DM (type 2 diabetes mellitus) (HCC)  Past Surgical History: 04/21/2021: A/V FISTULAGRAM; Left     Comment:  Procedure: A/V FISTULAGRAM;  Surgeon: Jama Cordella MATSU, MD;  Location: [facility];  Service:               Cardiovascular;  Laterality: Left; 11/02/2022: A/V FISTULAGRAM; Left     Comment:  Procedure: A/V Fistulagram;  Surgeon: Jama Cordella MATSU, MD;  Location: [facility];  Service:               Cardiovascular;  Laterality: Left; 02/06/2021: AV FISTULA PLACEMENT; Left     Comment:  Procedure: ARTERIOVENOUS (AV) FISTULA CREATION (BRACHIAL              CEPHALIC );  Surgeon: Jama Cordella MATSU, MD;  Location:               [facility];  Service: Vascular;  Laterality: Left; 01/26/2016: COLONOSCOPY WITH PROPOFOL ; N/A     Comment:  Procedure: COLONOSCOPY WITH PROPOFOL ;  Surgeon: Gladis RAYMOND Mariner, MD;  Location: [facility] ENDOSCOPY;  Service:  Endoscopy;  Laterality: N/A; 01/27/2016: COLONOSCOPY WITH PROPOFOL ; N/A     Comment:  Procedure: COLONOSCOPY WITH PROPOFOL ;  Surgeon: Gladis RAYMOND Mariner, MD;  Location: [facility] ENDOSCOPY;  Service:               Endoscopy;  Laterality: N/A; 2013: CORONARY ARTERY BYPASS GRAFT 02/03/2021: DIALYSIS/PERMA CATHETER INSERTION; N/A     Comment:  Procedure: DIALYSIS/PERMA CATHETER INSERTION;  Surgeon:               Jama Cordella MATSU, MD;  Location: [facility];               Service: Cardiovascular;  Laterality: N/A; 06/16/2021: DIALYSIS/PERMA CATHETER INSERTION; N/A     Comment:  Procedure: DIALYSIS/PERMA CATHETER INSERTION;  Surgeon:               Marea Selinda RAMAN, MD;   Location: [facility];                Service: Cardiovascular;  Laterality: N/A; 09/22/2021: DIALYSIS/PERMA CATHETER REMOVAL; N/A     Comment:  Procedure: DIALYSIS/PERMA CATHETER REMOVAL;  Surgeon:               Jama Cordella MATSU, MD;  Location: [facility];               Service: Cardiovascular;  Laterality: N/A; No date: INCISION AND DRAINAGE     Comment:  chest abscess 12/11/2018: INCISION AND DRAINAGE ABSCESS; N/A     Comment:  Procedure: INCISION AND DRAINAGE PERINEAL;  Surgeon:               Jordis Laneta FALCON, MD;  Location: [facility] ORS;  Service:               General;  Laterality: N/A; No date: RETINAL LASER PROCEDURE     Reproductive/Obstetrics negative OB ROS                              Anesthesia Physical Anesthesia Plan  ASA: 3  Anesthesia Plan: General   Post-op Pain Management:    Induction: Intravenous  PONV Risk Score and Plan: Propofol  infusion and TIVA  Airway Management Planned: Natural Airway and Nasal Cannula  Additional Equipment:   Intra-op Plan:   Post-operative Plan:   Informed Consent: I have reviewed the patients History and Physical, chart, labs and discussed the procedure including the risks, benefits and alternatives for the proposed anesthesia with the patient or authorized representative who has indicated his/her understanding and acceptance.     Dental Advisory Given  Plan Discussed with: CRNA  Anesthesia Plan Comments:          Anesthesia Quick Evaluation

## 2024-07-05 ENCOUNTER — Emergency Department

## 2024-07-05 ENCOUNTER — Observation Stay
Admission: EM | Admit: 2024-07-05 | Discharge: 2024-07-06 | Disposition: A | Discharge status: Home / Self Care | Attending: Internal Medicine | Admitting: Internal Medicine

## 2024-07-05 ENCOUNTER — Encounter: Payer: Self-pay | Admitting: Emergency Medicine

## 2024-07-05 ENCOUNTER — Other Ambulatory Visit: Payer: Self-pay

## 2024-07-05 DIAGNOSIS — I2510 Atherosclerotic heart disease of native coronary artery without angina pectoris: Secondary | ICD-10-CM | POA: Diagnosis not present

## 2024-07-05 DIAGNOSIS — R0602 Shortness of breath: Secondary | ICD-10-CM | POA: Diagnosis present

## 2024-07-05 DIAGNOSIS — Z794 Long term (current) use of insulin: Secondary | ICD-10-CM | POA: Diagnosis not present

## 2024-07-05 DIAGNOSIS — I10 Essential (primary) hypertension: Secondary | ICD-10-CM | POA: Diagnosis present

## 2024-07-05 DIAGNOSIS — E1122 Type 2 diabetes mellitus with diabetic chronic kidney disease: Secondary | ICD-10-CM | POA: Diagnosis not present

## 2024-07-05 DIAGNOSIS — Z8673 Personal history of transient ischemic attack (TIA), and cerebral infarction without residual deficits: Secondary | ICD-10-CM | POA: Insufficient documentation

## 2024-07-05 DIAGNOSIS — I5032 Chronic diastolic (congestive) heart failure: Secondary | ICD-10-CM | POA: Diagnosis not present

## 2024-07-05 DIAGNOSIS — Z951 Presence of aortocoronary bypass graft: Secondary | ICD-10-CM | POA: Insufficient documentation

## 2024-07-05 DIAGNOSIS — Z79899 Other long term (current) drug therapy: Secondary | ICD-10-CM | POA: Diagnosis not present

## 2024-07-05 DIAGNOSIS — E66812 Obesity, class 2: Secondary | ICD-10-CM | POA: Insufficient documentation

## 2024-07-05 DIAGNOSIS — J810 Acute pulmonary edema: Secondary | ICD-10-CM | POA: Insufficient documentation

## 2024-07-05 DIAGNOSIS — Z7982 Long term (current) use of aspirin: Secondary | ICD-10-CM | POA: Diagnosis not present

## 2024-07-05 DIAGNOSIS — N186 End stage renal disease: Secondary | ICD-10-CM | POA: Diagnosis not present

## 2024-07-05 DIAGNOSIS — I132 Hypertensive heart and chronic kidney disease with heart failure and with stage 5 chronic kidney disease, or end stage renal disease: Secondary | ICD-10-CM | POA: Insufficient documentation

## 2024-07-05 DIAGNOSIS — E1129 Type 2 diabetes mellitus with other diabetic kidney complication: Secondary | ICD-10-CM | POA: Diagnosis present

## 2024-07-05 DIAGNOSIS — Z992 Dependence on renal dialysis: Secondary | ICD-10-CM | POA: Insufficient documentation

## 2024-07-05 DIAGNOSIS — J9601 Acute respiratory failure with hypoxia: Principal | ICD-10-CM | POA: Insufficient documentation

## 2024-07-05 DIAGNOSIS — Z6837 Body mass index (BMI) 37.0-37.9, adult: Secondary | ICD-10-CM | POA: Insufficient documentation

## 2024-07-05 DIAGNOSIS — Z7901 Long term (current) use of anticoagulants: Secondary | ICD-10-CM | POA: Insufficient documentation

## 2024-07-05 DIAGNOSIS — E8770 Fluid overload, unspecified: Secondary | ICD-10-CM | POA: Diagnosis not present

## 2024-07-05 DIAGNOSIS — D72829 Elevated white blood cell count, unspecified: Secondary | ICD-10-CM | POA: Diagnosis not present

## 2024-07-05 LAB — CBC
HCT: 32.6 % — ABNORMAL LOW (ref 36.0–46.0)
HCT: 30.9 % — ABNORMAL LOW (ref 36.0–46.0)
Hemoglobin: 10.8 g/dL — ABNORMAL LOW (ref 12.0–15.0)
Hemoglobin: 10.5 g/dL — ABNORMAL LOW (ref 12.0–15.0)
MCH: 30.6 pg (ref 26.0–34.0)
MCH: 31.1 pg (ref 26.0–34.0)
MCHC: 33.1 g/dL (ref 30.0–36.0)
MCHC: 34 g/dL (ref 30.0–36.0)
MCV: 92.4 fL (ref 80.0–100.0)
MCV: 91.4 fL (ref 80.0–100.0)
Platelets: 256 K/uL (ref 150–400)
Platelets: 261 K/uL (ref 150–400)
RBC: 3.53 MIL/uL — ABNORMAL LOW (ref 3.87–5.11)
RBC: 3.38 MIL/uL — ABNORMAL LOW (ref 3.87–5.11)
RDW: 15 % (ref 11.5–15.5)
RDW: 14.8 % (ref 11.5–15.5)
WBC: 13.7 K/uL — ABNORMAL HIGH (ref 4.0–10.5)
WBC: 14.7 K/uL — ABNORMAL HIGH (ref 4.0–10.5)
nRBC: 0 % (ref 0.0–0.2)
nRBC: 0 % (ref 0.0–0.2)

## 2024-07-05 LAB — BASIC METABOLIC PANEL WITH GFR
Anion gap: 13 (ref 5–15)
BUN: 47 mg/dL — ABNORMAL HIGH (ref 8–23)
CO2: 23 mmol/L (ref 22–32)
Calcium: 9.7 mg/dL (ref 8.9–10.3)
Chloride: 104 mmol/L (ref 98–111)
Creatinine, Ser: 5.05 mg/dL — ABNORMAL HIGH (ref 0.44–1.00)
GFR, Estimated: 9 mL/min — ABNORMAL LOW (ref >60)
Glucose, Bld: 156 mg/dL — ABNORMAL HIGH (ref 70–99)
Potassium: 3.8 mmol/L (ref 3.5–5.1)
Sodium: 140 mmol/L (ref 135–145)

## 2024-07-05 LAB — CREATININE, SERUM
Creatinine, Ser: 5.22 mg/dL — ABNORMAL HIGH (ref 0.44–1.00)
GFR, Estimated: 9 mL/min — ABNORMAL LOW (ref >60)

## 2024-07-05 LAB — GLUCOSE, CAPILLARY
Glucose-Capillary: 216 mg/dL — ABNORMAL HIGH (ref 70–99)
Glucose-Capillary: 203 mg/dL — ABNORMAL HIGH (ref 70–99)

## 2024-07-05 LAB — URINALYSIS, COMPLETE (UACMP) WITH MICROSCOPIC
Bilirubin Urine: NEGATIVE
Glucose, UA: >=500 mg/dL — AB
Ketones, ur: NEGATIVE mg/dL
Leukocytes,Ua: NEGATIVE
Nitrite: NEGATIVE
Protein, ur: >=300 mg/dL — AB
Specific Gravity, Urine: 1.014 (ref 1.005–1.030)
pH: 6 (ref 5.0–8.0)

## 2024-07-05 LAB — HEPATITIS B SURFACE ANTIGEN: Hepatitis B Surface Ag: NONREACTIVE

## 2024-07-05 LAB — TROPONIN I (HIGH SENSITIVITY): Troponin I (High Sensitivity): 49 ng/L — ABNORMAL HIGH (ref <18)

## 2024-07-05 LAB — RESP PANEL BY RT-PCR (RSV, FLU A&B, COVID)  RVPGX2
Influenza A by PCR: NEGATIVE
Influenza B by PCR: NEGATIVE
Resp Syncytial Virus by PCR: NEGATIVE
SARS Coronavirus 2 by RT PCR: NEGATIVE

## 2024-07-05 LAB — BRAIN NATRIURETIC PEPTIDE: B Natriuretic Peptide: 873.4 pg/mL — ABNORMAL HIGH (ref 0.0–100.0)

## 2024-07-05 MED ORDER — FUROSEMIDE 10 MG/ML IJ SOLN
40.0000 mg | Freq: Two times a day (BID) | INTRAMUSCULAR | Status: DC
  Administered 2024-07-05: 40 mg via INTRAVENOUS
  Filled 2024-07-05 (×2): qty 4

## 2024-07-05 MED ORDER — ACETAMINOPHEN 650 MG RE SUPP
650.0000 mg | Freq: Four times a day (QID) | RECTAL | Status: DC | PRN
Start: 2024-07-05 — End: 2024-07-07

## 2024-07-05 MED ORDER — HEPARIN SODIUM (PORCINE) 1000 UNIT/ML IJ SOLN
INTRAMUSCULAR | Status: AC
  Filled 2024-07-05: qty 10

## 2024-07-05 MED ORDER — INSULIN ASPART 100 UNIT/ML IJ SOLN: 0.0000 [IU] | INTRAMUSCULAR | Status: DC

## 2024-07-05 MED ORDER — POLYETHYLENE GLYCOL 3350 17 G PO PACK: 17.0000 g | PACK | Freq: Every day | ORAL | Status: DC | PRN

## 2024-07-05 MED ORDER — ATORVASTATIN CALCIUM 20 MG PO TABS
80.0000 mg | ORAL_TABLET | Freq: Every day | ORAL | Status: DC
Start: 2024-07-05 — End: 2024-07-07
  Administered 2024-07-05 – 2024-07-06 (×2): 80 mg via ORAL
  Filled 2024-07-05 (×2): qty 4

## 2024-07-05 MED ORDER — ONDANSETRON HCL 4 MG PO TABS: 4.0000 mg | ORAL_TABLET | Freq: Four times a day (QID) | ORAL | Status: DC | PRN

## 2024-07-05 MED ORDER — ASPIRIN 81 MG PO TBEC
81.0000 mg | DELAYED_RELEASE_TABLET | Freq: Every morning | ORAL | Status: DC
  Administered 2024-07-06: 81 mg via ORAL
  Filled 2024-07-05: qty 1

## 2024-07-05 MED ORDER — AMLODIPINE BESYLATE 5 MG PO TABS
5.0000 mg | ORAL_TABLET | Freq: Every day | ORAL | Status: DC
  Administered 2024-07-05: 5 mg via ORAL
  Filled 2024-07-05: qty 1

## 2024-07-05 MED ORDER — ONDANSETRON HCL 4 MG/2ML IJ SOLN: 4.0000 mg | Freq: Four times a day (QID) | INTRAMUSCULAR | Status: DC | PRN

## 2024-07-05 MED ORDER — INSULIN ASPART 100 UNIT/ML IJ SOLN
0.0000 [IU] | INTRAMUSCULAR | Status: AC
  Administered 2024-07-06: 4 [IU] via SUBCUTANEOUS
  Administered 2024-07-06: 7 [IU] via SUBCUTANEOUS
  Administered 2024-07-06: 3 [IU] via SUBCUTANEOUS
  Filled 2024-07-05 (×3): qty 1

## 2024-07-05 MED ORDER — CHLORHEXIDINE GLUCONATE CLOTH 2 % EX PADS
6.0000 | MEDICATED_PAD | Freq: Every day | CUTANEOUS | Status: DC
  Administered 2024-07-06: 6 via TOPICAL
  Filled 2024-07-05: qty 6

## 2024-07-05 MED ORDER — HEPARIN SODIUM (PORCINE) 5000 UNIT/ML IJ SOLN
5000.0000 [IU] | Freq: Three times a day (TID) | INTRAMUSCULAR | Status: DC
  Administered 2024-07-05 – 2024-07-06 (×3): 5000 [IU] via SUBCUTANEOUS
  Filled 2024-07-05 (×3): qty 1

## 2024-07-05 MED ORDER — ACETAMINOPHEN 325 MG PO TABS: 650.0000 mg | ORAL_TABLET | Freq: Four times a day (QID) | ORAL | Status: DC | PRN

## 2024-07-05 MED ORDER — METOPROLOL TARTRATE 50 MG PO TABS
50.0000 mg | ORAL_TABLET | Freq: Two times a day (BID) | ORAL | Status: DC
  Administered 2024-07-05: 50 mg via ORAL
  Filled 2024-07-05 (×2): qty 1

## 2024-07-05 MED ORDER — CLOPIDOGREL BISULFATE 75 MG PO TABS
75.0000 mg | ORAL_TABLET | Freq: Every day | ORAL | Status: DC
  Administered 2024-07-06: 75 mg via ORAL
  Filled 2024-07-05: qty 1

## 2024-07-05 NOTE — ED Triage Notes (Signed)
 Patient to ED via POV for SOB since yesterday. Family reports O2 79% on RA at home yesterday. Due to SOB, patient missed dialysis treatment yesterday. Currently 77% on RA in triage.

## 2024-07-05 NOTE — ED Provider Notes (Signed)
 [facility] Provider Note    Event Date/Time   First MD Initiated Contact with Patient 07/05/24 1008     (approximate)   History   Chief Complaint Shortness of Breath   HPI  Sharon Arias is a 63 y.o. female with past medical history of hypertension, diabetes, CAD status post CABG, CHF, stroke, and ESRD on HD (MWF) who presents to the ED complaining of shortness of breath.  Patient reports that she has had increasing difficulty breathing over the past 2 days, states symptoms were bad enough yesterday that she was not able to make it to her dialysis session.  She denies any associated fevers, cough, or pain in her chest.  She does state that she feels much worse when lying flat, had to prop herself up with 3 pillows last night and has noticed some swelling in her legs.     Physical Exam   Triage Vital Signs: ED Triage Vitals  Encounter Vitals Group     BP 07/05/24 0937 (!) 171/60     Girls Systolic BP Percentile --      Girls Diastolic BP Percentile --      Boys Systolic BP Percentile --      Boys Diastolic BP Percentile --      Pulse Rate 07/05/24 0937 72     Resp 07/05/24 0937 (!) 22     Temp 07/05/24 0937 99.1 F (37.3 C)     Temp Source 07/05/24 0937 Oral     SpO2 07/05/24 0937 91 %     Weight 07/05/24 0935 250 lb (113.4 kg)     Height 07/05/24 0935 5' 7 (1.702 m)     Head Circumference --      Peak Flow --      Pain Score 07/05/24 0935 0     Pain Loc --      Pain Education --      Exclude from Growth Chart --     Most recent vital signs: Vitals:   07/05/24 0937  BP: (!) 171/60  Pulse: 72  Resp: (!) 22  Temp: 99.1 F (37.3 C)  SpO2: 91%    Constitutional: Alert and oriented. Eyes: Conjunctivae are normal. Head: Atraumatic. Nose: No congestion/rhinnorhea. Mouth/Throat: Mucous membranes are moist.  Cardiovascular: Normal rate, regular rhythm. Grossly normal heart sounds.  2+ radial pulses bilaterally.  Left upper extremity AV  fistula with palpable thrill. Respiratory: Tachypneic with mildly increased respiratory effort.  No retractions. Lungs with crackles to bilateral bases. Gastrointestinal: Soft and nontender. No distention. Musculoskeletal: No lower extremity tenderness nor edema.  Neurologic:  Normal speech and language. No gross focal neurologic deficits are appreciated.    ED Results / Procedures / Treatments   Labs (all labs ordered are listed, but only abnormal results are displayed) Labs Reviewed  BASIC METABOLIC PANEL WITH GFR - Abnormal; Notable for the following components:      Result Value   Glucose, Bld 156 (*)    BUN 47 (*)    Creatinine, Ser 5.05 (*)    GFR, Estimated 9 (*)    All other components within normal limits  CBC - Abnormal; Notable for the following components:   WBC 13.7 (*)    RBC 3.53 (*)    Hemoglobin 10.8 (*)    HCT 32.6 (*)    All other components within normal limits  BRAIN NATRIURETIC PEPTIDE - Abnormal; Notable for the following components:   B Natriuretic Peptide 873.4 (*)  All other components within normal limits  HEPATITIS B SURFACE ANTIGEN  TROPONIN I (HIGH SENSITIVITY)     EKG  ED ECG REPORT I, Carlin Palin, the attending physician, personally viewed and interpreted this ECG.   Date: 07/05/2024  EKG Time: 9:39  Rate: 73  Rhythm: normal sinus rhythm  Axis: Normal  Intervals:nonspecific intraventricular conduction delay  ST&T Change: Lateral T wave inversions  RADIOLOGY Chest x-ray reviewed and interpreted by me with multifocal infiltrates most consistent with edema, bilateral effusions noted.  PROCEDURES:  Critical Care performed: Yes, see critical care procedure note(s)  .Critical Care  Performed by: Palin Carlin, MD Authorized by: Palin Carlin, MD   Critical care provider statement:    Critical care time (minutes):  30   Critical care time was exclusive of:  Separately billable procedures and treating other patients and  teaching time   Critical care was necessary to treat or prevent imminent or life-threatening deterioration of the following conditions:  Respiratory failure   Critical care was time spent personally by me on the following activities:  Development of treatment plan with patient or surrogate, discussions with consultants, evaluation of patient's response to treatment, examination of patient, ordering and review of laboratory studies, ordering and review of radiographic studies, ordering and performing treatments and interventions, pulse oximetry, re-evaluation of patient's condition and review of old charts   I assumed direction of critical care for this patient from another provider in my specialty: no     Care discussed with: admitting provider      MEDICATIONS ORDERED IN ED: Medications  Chlorhexidine  Gluconate Cloth 2 % PADS 6 each (has no administration in time range)     IMPRESSION / MDM / ASSESSMENT AND PLAN / ED COURSE  I reviewed the triage vital signs and the nursing notes.                              63 y.o. female with a past medical history of hypertension, diabetes, CAD status post CABG, CHF, stroke, and ESRD on HD who presents to the ED complaining of increasing difficulty breathing over the past 2 days, missed dialysis yesterday.  Patient's presentation is most consistent with acute presentation with potential threat to life or bodily function.  Differential diagnosis includes, but is not limited to, pulmonary edema, pneumonia, ACS, PE, anemia, electrolyte abnormality.  Patient in mild respiratory distress, noted to be hypoxic in triage to 79% on room air with some tachypnea, vital signs otherwise reassuring.  She was placed on 4 L nasal cannula with improvement, appears clinically fluid overloaded and chest x-ray concerning for pulmonary edema.  Labs without significant anemia or leukocytosis, consistent with known ESRD without acute electrolyte abnormality.  EKG shows no  evidence of arrhythmia or ischemia, troponin pending.  Shortness of breath likely due to fluid overload and missed dialysis, will contact nephrology for dialysis session.  Case discussed with Dr. Dennise of nephrology, who will arrange for hemodialysis.  BNP elevated consistent with fluid overload.  Case discussed with hospitalist for admission.      FINAL CLINICAL IMPRESSION(S) / ED DIAGNOSES   Final diagnoses:  Acute respiratory failure with hypoxia (HCC)  Acute pulmonary edema (HCC)  ESRD on hemodialysis (HCC)     Rx / DC Orders   ED Discharge Orders     None        Note:  This document was prepared using Dragon voice recognition software and may  include unintentional dictation errors.   Willo Dunnings, MD 07/05/24 1057

## 2024-07-05 NOTE — Procedures (Signed)
 Faith Harris, NP gave a verbal order for  HD x 3hrs DUF with UF goal of 3L Order also confirmed via EPIC chat.

## 2024-07-05 NOTE — Progress Notes (Signed)
 [facility]  ROUNDING NOTE   Subjective:   Sharon Arias is a 63 year old female with past medical conditions including hypertension, diabetes, CAD status post CABG, CHF, stroke, and end-stage renal disease on hemodialysis.  Patient presents to the emergency department with shortness of breath and has been admitted for Acute pulmonary edema (HCC) [J81.0] Acute respiratory failure with hypoxia (HCC) [J96.01] ESRD on hemodialysis (HCC) [N18.6, Z99.2] Acute hypoxemic respiratory failure (HCC) [J96.01]  Patient is known our practice and receives outpatient dialysis treatments at [facility] on a MWF schedule, supervised by Dr. Douglas.  Patient does report missing treatment yesterday.  States she was awoken this morning with shortness of breath.  States she has been scheduled for colonoscopy in the past but was unable to tolerate the prep volume.  She felt she will practice by drinking water to prepare for bowel prep.  Patient also states the past 2 or 3 days she has had decreased oral intake.  Labs on ED arrival concerning for white count 13.7 hemoglobin 10.8.  Troponin 49 with BNP 873.  Chest x-ray shows questionable basilar edema versus pneumo Titus versus multilobar pneumonia.  We have been consulted to manage dialysis needs.  Objective:  Vital signs in last 24 hours:  Temp:  [97.6 F (36.4 C)-99.1 F (37.3 C)] 97.6 F (36.4 C) (07/17 1618) Pulse Rate:  [59-72] 62 (07/17 1618) Resp:  [17-25] 20 (07/17 1618) BP: (111-175)/(34-134) 175/59 (07/17 1618) SpO2:  [91 %-96 %] 95 % (07/17 1618) Weight:  [113.4 kg-113.5 kg] 113.5 kg (07/17 1242)  Weight change:  Filed Weights   07/05/24 0935 07/05/24 1242  Weight: 113.4 kg 113.5 kg    Intake/Output: No intake/output data recorded.   Intake/Output this shift:  No intake/output data recorded.  Physical Exam: General: Ill-appearing  Head: Normocephalic, atraumatic. Moist oral mucosal membranes  Eyes: Anicteric   Neck: Supple  Lungs:  Crackles throughout  Heart: Regular rate and rhythm  Abdomen:  Soft, nontender  Extremities: 1-2+ peripheral edema.  Neurologic: Awake, alert, conversant  Skin: Warm,dry, no rash  Access: Left aVF    Basic Metabolic Panel: Recent Labs  Lab 07/05/24 0940 07/05/24 1310  NA 140  --   K 3.8  --   CL 104  --   CO2 23  --   GLUCOSE 156*  --   BUN 47*  --   CREATININE 5.05* 5.22*  CALCIUM  9.7  --     Liver Function Tests: No results for input(s): AST, ALT, ALKPHOS, BILITOT, PROT, ALBUMIN in the last 168 hours. No results for input(s): LIPASE, AMYLASE in the last 168 hours. No results for input(s): AMMONIA in the last 168 hours.  CBC: Recent Labs  Lab 07/05/24 0940 07/05/24 1310  WBC 13.7* 14.7*  HGB 10.8* 10.5*  HCT 32.6* 30.9*  MCV 92.4 91.4  PLT 256 261    Cardiac Enzymes: No results for input(s): CKTOTAL, CKMB, CKMBINDEX, TROPONINI in the last 168 hours.  BNP: Invalid input(s): POCBNP  CBG: No results for input(s): GLUCAP in the last 168 hours.  Microbiology: Results for orders placed or performed during the hospital encounter of 11/26/23  MRSA Next Gen by PCR, Nasal     Status: None   Collection Time: 11/29/23  9:13 PM   Specimen: Nasal Mucosa; Nasal Swab  Result Value Ref Range Status   MRSA by PCR Next Gen NOT DETECTED NOT DETECTED Final    Comment: (NOTE) The GeneXpert MRSA Assay (FDA approved for NASAL specimens  only), is one component of a comprehensive MRSA colonization surveillance program. It is not intended to diagnose MRSA infection nor to guide or monitor treatment for MRSA infections. Test performance is not FDA approved in patients less than 37 years old. Performed at [facility], [address] [place], [place] [address]     Coagulation Studies: No results for input(s): LABPROT, INR in the last 72 hours.  Urinalysis: No results for input(s): COLORURINE,  LABSPEC, PHURINE, GLUCOSEU, HGBUR, BILIRUBINUR, KETONESUR, PROTEINUR, UROBILINOGEN, NITRITE, LEUKOCYTESUR in the last 72 hours.  Invalid input(s): APPERANCEUR    Imaging: DG Chest Port 1 View Result Date: 07/05/2024 CLINICAL DATA:  Shortness of breath starting yesterday.  Hypoxia. EXAM: PORTABLE CHEST 1 VIEW COMPARISON:  11/27/2023 FINDINGS: Reverse lordotic projection with mild obscuration of the medial right lung apex due to patient facial tissues. Prior CABG.  Atherosclerotic calcification of the aortic arch. Even accounting for the apical lordotic projection, there appears to be increasing indistinct bibasilar airspace opacities with some obscuration of the hemidiaphragms against a background of indistinct pulmonary vasculature. This appearance could reflect basilar edema, aspiration pneumonitis, or multilobar pneumonia superimposed on pulmonary venous hypertension. Stable cardiomegaly. IMPRESSION: 1. Increasing indistinct bibasilar airspace opacities with some obscuration of the hemidiaphragms against a background of indistinct pulmonary vasculature. This appearance could reflect basilar edema, aspiration pneumonitis, or multilobar pneumonia superimposed on pulmonary venous hypertension. 2. Stable cardiomegaly. 3. Prior CABG. 4. Aortic Atherosclerosis (ICD10-I70.0). Electronically Signed   By: Ryan Salvage M.D.   On: 07/05/2024 10:22     Medications:     amLODipine   5 mg Oral QHS   [START ON 07/06/2024] aspirin  EC  81 mg Oral q AM   atorvastatin   80 mg Oral Daily   Chlorhexidine  Gluconate Cloth  6 each Topical Q0600   [START ON 07/06/2024] clopidogrel   75 mg Oral Daily   furosemide   40 mg Intravenous BID   heparin   5,000 Units Subcutaneous Q8H   metoprolol  tartrate  50 mg Oral BID   acetaminophen  **OR** acetaminophen , ondansetron  **OR** ondansetron  (ZOFRAN ) IV, polyethylene glycol  Assessment/ Plan:  [name] is a 63 y.o.  female with past medical  conditions including hypertension, diabetes, CAD status post CABG, CHF, stroke, and end-stage renal disease on hemodialysis.  Patient presents to the emergency department with shortness of breath and has been admitted for  Acute pulmonary edema (HCC) [J81.0] Acute respiratory failure with hypoxia (HCC) [J96.01] ESRD on hemodialysis (HCC) [N18.6, Z99.2] Acute hypoxemic respiratory failure (HCC) [J96.01]   End stage renal disease on hemodialysis. Last treatment completed on Monday. Will receive UF only treatment today for 3L fluid removal.   2. Acute respiratory failure, chest xray shows questionable pneumonia. Placed of 4L [place]  3. Anemia of chronic kidney disease Lab Results  Component Value Date   HGB 10.5 (L) 07/05/2024    Hgb within optimal range.   4. Hypertension with chronic kidney disease, Currently receiving amlodipine , furosemide  and metoprolol .    LOS: 0 Isay Perleberg 7/17/20254:40 PM

## 2024-07-05 NOTE — ED Notes (Signed)
 Pt to dialysis.

## 2024-07-05 NOTE — Procedures (Signed)
 Received patient in bed to unit.  Alert and oriented.  Informed consent signed and in chart.   TX duration: 3hrs DUF.   Patient tolerated well.  Transported back to the room  Alert, without acute distress.  Hand-off given to patient's nurse.   Access used: Left upper arm AVF.  Access issues: NONE  Total UF removed: 3000 ml. Medication(s) given: NONE    [facility]

## 2024-07-05 NOTE — H&P (Signed)
 History and Physical    ODETH BRY FMW:[id number] DOB: 27-Feb-1961 DOA: 07/05/2024  DOS: the patient was seen and examined on 07/05/2024  PCP: Auston Reyes BIRCH, MD   Patient coming from: Home  I have personally briefly reviewed patient's old medical records in [facility]  Chief Complaint: Shortness of breath  HPI: Sharon Arias is a pleasant 63 y.o. female with medical history significant for HTN, DM, CAD s/p CABG, CHF, stroke and ESRD on hemodialysis Monday Wednesday and Friday who presented to ED at [facility] complaining of difficulty breathing over the past 2 days.  She stated that her symptoms were bad enough as today that she was not able to make it to her dialysis session.  She stated that she has been scheduled for colonoscopy on August 7.  Last time she was not able to drink bowel preparation.  This time she is practicing to drink water and bowel preparation and she thinks that might have caused her to have fluid overload.  She denies any fever, cough, chest pain or palpitation.  She feels that she has been fluid overloaded.  She stated that she needs 3 pillows to sleep and her legs have been swollen.  ED Course: Upon arrival to the ED, patient is found to be hypoxemic 90% on room air requiring oxygen, tachypneic around 22, bilateral leg edema and bilateral basal Rales and increased work of breathing.  They started her on oxygen, called nephrology for evaluation, Dr. Dennise will come and see the patient.  Hospitalist service was consulted for evaluation for admission.  Review of Systems:  ROS  All other systems negative except as noted in the HPI.  Past Medical History:  Diagnosis Date   Anemia in chronic kidney disease 05/19/2016   Anxiety    Aortic atherosclerosis (HCC)    Blind    CAD (coronary artery disease)    Cardiac murmur    CHF (congestive heart failure) (HCC)    G2DD on 02/2020 TTE   Chicken pox    Chronic anticoagulation    CVA  (cerebral vascular accident) (HCC)    DDD (degenerative disc disease), lumbar    Detached retina    Diabetic retinopathy (HCC)    legally blind   Diastolic congestive heart failure (HCC)    Dyspnea    ESRD needing dialysis (HCC)    Hx of CABG    Hyperlipidemia    Hypertension    NSTEMI (non-ST elevated myocardial infarction) (HCC) 06/2012   Obesity    Pneumonia    PONV (postoperative nausea and vomiting)    Sciatica of right side    Secondary hyperparathyroidism of renal origin (HCC)    Sleep apnea    CPAP NIGHTLY   Stage 5 chronic kidney disease (HCC)    T2DM (type 2 diabetes mellitus) (HCC)     Past Surgical History:  Procedure Laterality Date   A/V FISTULAGRAM Left 04/21/2021   Procedure: A/V FISTULAGRAM;  Surgeon: Jama Cordella MATSU, MD;  Location: [facility] INVASIVE CV LAB;  Service: Cardiovascular;  Laterality: Left;   A/V FISTULAGRAM Left 11/02/2022   Procedure: A/V Fistulagram;  Surgeon: Jama Cordella MATSU, MD;  Location: [facility] INVASIVE CV LAB;  Service: Cardiovascular;  Laterality: Left;   AV FISTULA PLACEMENT Left 02/06/2021   Procedure: ARTERIOVENOUS (AV) FISTULA CREATION (BRACHIAL CEPHALIC );  Surgeon: Jama Cordella MATSU, MD;  Location: [facility] ORS;  Service: Vascular;  Laterality: Left;   COLONOSCOPY N/A 06/14/2024   Procedure: COLONOSCOPY;  Surgeon:  Onita Elspeth Sharper, DO;  Location: [facility] ENDOSCOPY;  Service: Gastroenterology;  Laterality: N/A;  IDDM  Patient on Plavix    COLONOSCOPY WITH PROPOFOL  N/A 01/26/2016   Procedure: COLONOSCOPY WITH PROPOFOL ;  Surgeon: Gladis RAYMOND Mariner, MD;  Location: [facility] ENDOSCOPY;  Service: Endoscopy;  Laterality: N/A;   COLONOSCOPY WITH PROPOFOL  N/A 01/27/2016   Procedure: COLONOSCOPY WITH PROPOFOL ;  Surgeon: Gladis RAYMOND Mariner, MD;  Location: [facility] ENDOSCOPY;  Service: Endoscopy;  Laterality: N/A;   CORONARY ARTERY BYPASS GRAFT  2013   DIALYSIS/PERMA CATHETER INSERTION N/A 02/03/2021   Procedure: DIALYSIS/PERMA CATHETER INSERTION;  Surgeon: Jama Cordella MATSU, MD;  Location: [facility] INVASIVE CV LAB;  Service: Cardiovascular;  Laterality: N/A;   DIALYSIS/PERMA CATHETER INSERTION N/A 06/16/2021   Procedure: DIALYSIS/PERMA CATHETER INSERTION;  Surgeon: Marea Selinda RAMAN, MD;  Location: [facility] INVASIVE CV LAB;  Service: Cardiovascular;  Laterality: N/A;   DIALYSIS/PERMA CATHETER REMOVAL N/A 09/22/2021   Procedure: DIALYSIS/PERMA CATHETER REMOVAL;  Surgeon: Jama Cordella MATSU, MD;  Location: [facility] INVASIVE CV LAB;  Service: Cardiovascular;  Laterality: N/A;   INCISION AND DRAINAGE     chest abscess   INCISION AND DRAINAGE ABSCESS N/A 12/11/2018   Procedure: INCISION AND DRAINAGE PERINEAL;  Surgeon: Jordis Laneta FALCON, MD;  Location: [facility] ORS;  Service: General;  Laterality: N/A;   RETINAL LASER PROCEDURE       reports that she has never smoked. She has never been exposed to tobacco smoke. She has never used smokeless tobacco. She reports that she does not drink alcohol  and does not use drugs.  Allergies  Allergen Reactions   Ozempic (0.25 Or 0.5 Mg-Dose) [Semaglutide(0.25 Or 0.5mg -Dos)] Diarrhea and Nausea Only   Trulicity [Dulaglutide] Diarrhea and Nausea Only   Other     Anesthesia--nausea/vomiting    Family History  Problem Relation Age of Onset   Breast cancer Mother 87   Prostate cancer Father        we think mets to liver and bone    Prior to Admission medications   Medication Sig Start Date End Date Taking? Authorizing Provider  acetaminophen  (TYLENOL ) 325 MG tablet Take 2 tablets (650 mg total) by mouth every 4 (four) hours as needed for mild pain (pain score 1-3) (or temp > 37.5 C (99.5 F)). 12/12/23  Yes Angiulli, Toribio PARAS, PA-C  allopurinol  (ZYLOPRIM ) 300 MG tablet Take 1 tablet (300 mg total) by mouth in the morning. 12/15/23  Yes Angiulli, Toribio PARAS, PA-C  amLODipine  (NORVASC ) 5 MG tablet Take 1 tablet (5 mg total) by mouth at bedtime. 12/15/23  Yes Angiulli, Toribio PARAS, PA-C  aspirin  EC 81 MG tablet Take 81 mg by mouth in the morning.    Yes [provider]  clopidogrel  (PLAVIX ) 75 MG tablet Take 1 tablet (75 mg total) by mouth daily. 12/15/23  Yes Angiulli, Toribio PARAS, PA-C  furosemide  (LASIX ) 40 MG tablet Take 1 tablet (40 mg total) by mouth daily as needed for edema. 12/15/23  Yes Angiulli, Toribio PARAS, PA-C  hydrALAZINE  (APRESOLINE ) 50 MG tablet Take 1 tablet (50 mg total) by mouth in the morning and at bedtime. 12/15/23  Yes Angiulli, Toribio PARAS, PA-C  insulin  lispro (HUMALOG ) 100 UNIT/ML KwikPen Inject 20 Units into the skin 3 (three) times daily with meals. 12/15/23  Yes Angiulli, Toribio PARAS, PA-C  losartan  (COZAAR ) 100 MG tablet Take 1 tablet (100 mg total) by mouth at bedtime. 12/15/23  Yes Angiulli, Toribio PARAS, PA-C  metoprolol  tartrate (LOPRESSOR ) 50 MG tablet Take 1 tablet (50 mg total)  by mouth 2 (two) times daily. 12/15/23  Yes Angiulli, Daniel J, PA-C  multivitamin (RENA-VIT) TABS tablet Take 1 tablet by mouth at bedtime. 02/18/21  Yes [provider]  rosuvastatin  (CRESTOR ) 40 MG tablet Take 40 mg by mouth.  Take 40 mg by mouth. 06/03/20  Yes [provider]  sevelamer  carbonate (RENVELA ) 800 MG tablet Take 1 tablet (800 mg total) by mouth 3 (three) times daily. 12/15/23  Yes Angiulli, Toribio PARAS, PA-C  TRESIBA  FLEXTOUCH 200 UNIT/ML FlexTouch Pen INJECT 74 UNITS SUBCUTANEOUSLY ONCE DAILY 06/14/24  Yes [provider]  acyclovir  (ZOVIRAX ) 200 MG capsule Take 1 capsule (200 mg total) by mouth 2 (two) times daily. Patient not taking: Reported on 06/14/2024 12/15/23   Angiulli, Toribio PARAS, PA-C  atorvastatin  (LIPITOR ) 80 MG tablet Take 1 tablet (80 mg total) by mouth daily. Patient not taking: Reported on 07/05/2024 12/15/23   Pegge Toribio PARAS, PA-C  insulin  glargine (LANTUS ) 100 UNIT/ML Solostar Pen Inject 66 Units into the skin at bedtime. Patient not taking: Reported on 07/05/2024 12/15/23   Angiulli, Toribio PARAS, PA-C  meclizine  (ANTIVERT ) 25 MG tablet Take 1 tablet (25 mg total) by mouth 3 (three) times  daily as needed for dizziness. Patient not taking: Reported on 07/05/2024 12/15/23   Angiulli, Toribio PARAS, PA-C  polyethylene glycol-electrolytes (NULYTELY ) 420 g solution Take by mouth. :By Mouth Patient not taking: Reported on 07/05/2024 06/28/24   [provider]  predniSONE  (DELTASONE ) 20 MG tablet Take 3 tablets (60 mg total) by mouth daily with breakfast. Patient not taking: Reported on 06/14/2024 12/15/23   Pegge Toribio PARAS, PA-C    Physical Exam: Vitals:   07/05/24 0935 07/05/24 0937 07/05/24 1242 07/05/24 1315  BP:  (!) 171/60 (!) 172/51 (!) 153/46  Pulse:  72 64   Resp:  (!) 22 (!) 25   Temp:  99.1 F (37.3 C) 98.3 F (36.8 C)   TempSrc:  Oral Oral   SpO2:  91% 96%   Weight: 113.4 kg  113.5 kg   Height: 5' 7 (1.702 m)       Physical Exam   Constitutional: Alert, awake, calm, comfortable HEENT: Neck supple Respiratory: Clear to auscultation B/L, no wheezing, no rales.  Cardiovascular: Regular rate and rhythm, no murmurs / rubs / gallops. No extremity edema. 2+ pedal pulses. No carotid bruits.  Abdomen: Soft, no tenderness, Bowel sounds positive.  Musculoskeletal: no clubbing / cyanosis. Good ROM, no contractures. Normal muscle tone.  Skin: no rashes, lesions, ulcers. Neurologic: CN 2-12 grossly intact. Sensation intact, No focal deficit identified Psychiatric: Alert and oriented x 3. Normal mood.    Labs on Admission: I have personally reviewed following labs and imaging studies  CBC: Recent Labs  Lab 07/05/24 0940  WBC 13.7*  HGB 10.8*  HCT 32.6*  MCV 92.4  PLT 256   Basic Metabolic Panel: Recent Labs  Lab 07/05/24 0940  NA 140  K 3.8  CL 104  CO2 23  GLUCOSE 156*  BUN 47*  CREATININE 5.05*  CALCIUM  9.7   GFR: Estimated Creatinine Clearance: 14.8 mL/min (A) (by C-G formula based on SCr of 5.05 mg/dL (H)). Liver Function Tests: No results for input(s): AST, ALT, ALKPHOS, BILITOT, PROT, ALBUMIN in the last 168 hours. No results  for input(s): LIPASE, AMYLASE in the last 168 hours. No results for input(s): AMMONIA in the last 168 hours. Coagulation Profile: No results for input(s): INR, PROTIME in the last 168 hours. Cardiac Enzymes: Recent Labs  Lab 07/05/24  0940  TROPONINIHS 49*   BNP (last 3 results) Recent Labs    07/05/24 0940  BNP 873.4*    Urine analysis:    Component Value Date/Time   COLORURINE STRAW (A) 05/10/2024 1542   APPEARANCEUR HAZY (A) 05/10/2024 1542   LABSPEC 1.009 05/10/2024 1542   PHURINE 8.0 05/10/2024 1542   GLUCOSEU >=500 (A) 05/10/2024 1542   HGBUR SMALL (A) 05/10/2024 1542   BILIRUBINUR NEGATIVE 05/10/2024 1542   KETONESUR NEGATIVE 05/10/2024 1542   PROTEINUR 100 (A) 05/10/2024 1542   NITRITE NEGATIVE 05/10/2024 1542   LEUKOCYTESUR NEGATIVE 05/10/2024 1542    Radiological Exams on Admission: I have personally reviewed images DG Chest Port 1 View Result Date: 07/05/2024 CLINICAL DATA:  Shortness of breath starting yesterday.  Hypoxia. EXAM: PORTABLE CHEST 1 VIEW COMPARISON:  11/27/2023 FINDINGS: Reverse lordotic projection with mild obscuration of the medial right lung apex due to patient facial tissues. Prior CABG.  Atherosclerotic calcification of the aortic arch. Even accounting for the apical lordotic projection, there appears to be increasing indistinct bibasilar airspace opacities with some obscuration of the hemidiaphragms against a background of indistinct pulmonary vasculature. This appearance could reflect basilar edema, aspiration pneumonitis, or multilobar pneumonia superimposed on pulmonary venous hypertension. Stable cardiomegaly. IMPRESSION: 1. Increasing indistinct bibasilar airspace opacities with some obscuration of the hemidiaphragms against a background of indistinct pulmonary vasculature. This appearance could reflect basilar edema, aspiration pneumonitis, or multilobar pneumonia superimposed on pulmonary venous hypertension. 2. Stable cardiomegaly. 3.  Prior CABG. 4. Aortic Atherosclerosis (ICD10-I70.0). Electronically Signed   By: Ryan Salvage M.D.   On: 07/05/2024 10:22    EKG: My personal interpretation of EKG shows: Abnormal EKG with some junctional rhythm, no ST elevation or depression    Assessment/Plan Principal Problem:   Acute hypoxemic respiratory failure (HCC) Active Problems:   Type 2 diabetes mellitus with renal complication (HCC)   HTN (hypertension)   History of stroke   ESRD (end stage renal disease) on dialysis (HCC)    Assessment and Plan:  This is a 63-year female W/PMH of ESRD on hemodialysis Monday Wednesday Friday, type 2 diabetes, hypertension, stroke, CAD s/p CABG, CHF who presented to ED complaining of shortness of breath for the last 2 to 3 days.  1.  Acute hypoxemic respiratory failure requiring oxygen - This patient has multiple reasons to have hypoxemia.  She has a history of ESRD on hemodialysis she missed dialysis yesterday.  She also says she has been drinking more water to practice for her colonoscopy preparation.  She has a history of congestive heart failure. - It looks like this was related to fluid overload. - She does not have hyperkalemia. - Nephrology Dr. Dennise was consulted from the ED who  will come and see the patient and place her in dialysis. - Continue oxygen to maintain saturation more than 90%  2.  Fluid overload/CHF/ESRD on hemodialysis - Continue hemodialysis per nephrology recommendation as above  3.  Type 2 diabetes - It appears that patient was not taking Lantus . -. Will place her on 10 units of Lantus  and sliding scale. - Blood sugars were not that bad.  4.  HTN: - Blood pressure is slightly high. - I will continue her metoprolol  and amlodipine  and hold her other medication as she is getting dialyzed. - Will continue to monitor blood pressure  5.  CHF/history of stroke - Will place her on Lasix  40 mg IV twice daily - Continue aspirin  and Plavix  and statin -  Supportive care -  Physical therapy and oxygen therapy  6.  Leukocytosis - She denies any fever or chills. - Chest x-ray showed possible fluid overload - Will get blood cultures, urinalysis and COVID and viral panel checked - She denies any fever cough or  dysuria, will hold off any antibiotic     DVT prophylaxis: SQ Heparin  Code Status: Full Code Family Communication: No family was available Disposition Plan: Home Consults called: Nephrology Dr. Dennise by ED Admission status: Inpatient, Telemetry bed   Nena Rebel, MD [facility] 07/05/2024, 1:25 PM

## 2024-07-05 NOTE — ED Notes (Signed)
 Report given to dialysis.

## 2024-07-06 DIAGNOSIS — J9601 Acute respiratory failure with hypoxia: Secondary | ICD-10-CM | POA: Diagnosis not present

## 2024-07-06 DIAGNOSIS — E8770 Fluid overload, unspecified: Secondary | ICD-10-CM

## 2024-07-06 LAB — COMPREHENSIVE METABOLIC PANEL WITH GFR
ALT: 17 U/L (ref 0–44)
AST: 14 U/L — ABNORMAL LOW (ref 15–41)
Albumin: 4.1 g/dL (ref 3.5–5.0)
Alkaline Phosphatase: 48 U/L (ref 38–126)
Anion gap: 13 (ref 5–15)
BUN: 54 mg/dL — ABNORMAL HIGH (ref 8–23)
CO2: 22 mmol/L (ref 22–32)
Calcium: 9.8 mg/dL (ref 8.9–10.3)
Chloride: 104 mmol/L (ref 98–111)
Creatinine, Ser: 5.45 mg/dL — ABNORMAL HIGH (ref 0.44–1.00)
GFR, Estimated: 8 mL/min — ABNORMAL LOW (ref >60)
Glucose, Bld: 166 mg/dL — ABNORMAL HIGH (ref 70–99)
Potassium: 3.6 mmol/L (ref 3.5–5.1)
Sodium: 139 mmol/L (ref 135–145)
Total Bilirubin: 1.1 mg/dL (ref 0.0–1.2)
Total Protein: 8.2 g/dL — ABNORMAL HIGH (ref 6.5–8.1)

## 2024-07-06 LAB — CBC
HCT: 30.3 % — ABNORMAL LOW (ref 36.0–46.0)
Hemoglobin: 10.2 g/dL — ABNORMAL LOW (ref 12.0–15.0)
MCH: 30.4 pg (ref 26.0–34.0)
MCHC: 33.7 g/dL (ref 30.0–36.0)
MCV: 90.2 fL (ref 80.0–100.0)
Platelets: 261 K/uL (ref 150–400)
RBC: 3.36 MIL/uL — ABNORMAL LOW (ref 3.87–5.11)
RDW: 14.7 % (ref 11.5–15.5)
WBC: 12.1 K/uL — ABNORMAL HIGH (ref 4.0–10.5)
nRBC: 0 % (ref 0.0–0.2)

## 2024-07-06 LAB — HEMOGLOBIN A1C
Hgb A1c MFr Bld: 6.7 % — ABNORMAL HIGH (ref 4.8–5.6)
Mean Plasma Glucose: 145.59 mg/dL

## 2024-07-06 LAB — GLUCOSE, CAPILLARY
Glucose-Capillary: 157 mg/dL — ABNORMAL HIGH (ref 70–99)
Glucose-Capillary: 132 mg/dL — ABNORMAL HIGH (ref 70–99)
Glucose-Capillary: 217 mg/dL — ABNORMAL HIGH (ref 70–99)

## 2024-07-06 LAB — HEPATITIS B SURFACE ANTIBODY, QUANTITATIVE: Hep B S AB Quant (Post): 38.8 m[IU]/mL

## 2024-07-06 MED ORDER — HEPARIN SODIUM (PORCINE) 1000 UNIT/ML DIALYSIS: 1000.0000 [IU] | INTRAMUSCULAR | Status: DC | PRN

## 2024-07-06 MED ORDER — PENTAFLUOROPROP-TETRAFLUOROETH EX AERO
1.0000 | INHALATION_SPRAY | CUTANEOUS | Status: DC | PRN
  Filled 2024-07-06: qty 30

## 2024-07-06 MED ORDER — PENTAFLUOROPROP-TETRAFLUOROETH EX AERO: 1.0000 | INHALATION_SPRAY | CUTANEOUS | Status: DC | PRN

## 2024-07-06 MED ORDER — LIDOCAINE-PRILOCAINE 2.5-2.5 % EX CREA: 1.0000 | TOPICAL_CREAM | CUTANEOUS | Status: DC | PRN

## 2024-07-06 MED ORDER — LIDOCAINE-PRILOCAINE 2.5-2.5 % EX CREA
1.0000 | TOPICAL_CREAM | CUTANEOUS | Status: DC | PRN
  Filled 2024-07-06: qty 5

## 2024-07-06 NOTE — Plan of Care (Signed)

## 2024-07-06 NOTE — Care Management CC44 (Signed)
 Condition Code 44 Documentation Completed  Patient Details  Name: Sharon Arias MRN: [id number] Date of Birth: 1961/02/14   Condition Code 44 given:  Yes Patient signature on Condition Code 44 notice:  Yes Documentation of 2 MD's agreement:  Yes Code 44 added to claim:  Yes    [name], RN 07/06/2024, 4:37 PM

## 2024-07-06 NOTE — Hospital Course (Addendum)
 63yo with h/o HTN, DM, CAD s/p CABG, chronic HFpEF, CVA, and ESRD on MWF HD who presented on 7/17 with SOB.  She has been practicing for her upcoming colonoscopy bowel prep and thinks that it led to volume overload.  O2 sats at home were 79% on RA, improved to 91% on 4L [place] O2.  She was started on BIPAP and has since been weaned to RA since undergoing HD.  She will have another HD treatment on 7/18 and then be appropriate for dc.

## 2024-07-06 NOTE — Care Management Obs Status (Signed)
 MEDICARE OBSERVATION STATUS NOTIFICATION   Patient Details  Name: Sharon Arias MRN: [id number] Date of Birth: 1961/06/22   Medicare Observation Status Notification Given:  Yes    [name], RN 07/06/2024, 4:37 PM

## 2024-07-06 NOTE — TOC Initial Note (Signed)
 Transition of Care [facility]) - Initial/Assessment Note    Patient Details  Name: Sharon Arias MRN: [id number] Date of Birth: Apr 28, 1961  [facility] of [facility]) CM/SW Contact:    [name], RN Phone Number: 07/06/2024, 4:46 PM  Clinical Narrative:                  Patient to discharge today Met with patient at bedside in dialysis  Patient states she wishes to discharge today, and daughter will provide discharge transportation.  Patient with qualifying sats for home o2.   Referral made to [facility] with [employer] for portable O2 to be delivered to bedside prior to discharge       Patient Goals and CMS Choice          Expected Discharge Plan and Services         Expected Discharge Date: 07/06/24                                    Prior Living Arrangements/Services                       Activities of Daily Living   ADL Screening (condition at time of admission) Independently performs ADLs?: Yes (appropriate for developmental age) Is the patient deaf or have difficulty hearing?: No Does the patient have difficulty seeing, even when wearing glasses/contacts?: Yes Does the patient have difficulty concentrating, remembering, or making decisions?: No  Permission Sought/Granted                  Emotional Assessment              Admission diagnosis:  Acute pulmonary edema (HCC) [J81.0] Acute respiratory failure with hypoxia (HCC) [J96.01] ESRD on hemodialysis (HCC) [N18.6, Z99.2] Acute hypoxemic respiratory failure (HCC) [J96.01] Hypervolemia associated with renal insufficiency [E87.70, N28.9] Patient Active Problem List   Diagnosis Date Noted   Hypervolemia associated with renal insufficiency 07/06/2024   Acute hypoxemic respiratory failure (HCC) 07/05/2024   Ischemic cerebrovascular accident (CVA) (HCC) 12/02/2023   Dizziness 11/26/2023   Peritoneal dialysis catheter, fitting and adjustment (HCC) 06/03/2021   Complication from renal  dialysis device 04/15/2021   ESRD (end stage renal disease) on dialysis (HCC) 03/02/2021   Stage 5 chronic kidney disease (HCC) 01/27/2021   Aortic atherosclerosis (HCC) 01/12/2021   Diabetic neuropathy (HCC) 01/12/2021   Multiple thyroid  nodules 01/12/2021   Diastolic congestive heart failure (HCC) 05/28/2020   Enterococcus faecalis infection 05/14/2020   Non-traumatic rhabdomyolysis 05/14/2020   Acute on chronic diastolic CHF (congestive heart failure) (HCC) 05/14/2020   Bradycardia 05/14/2020   Diabetes mellitus type 2, uncontrolled, with complications 05/14/2020   Morbid obesity with BMI of 45.0-49.9, adult (HCC) 05/14/2020   Rhabdomyolysis 05/14/2020   UTI (urinary tract infection) 05/12/2020   AKI (acute kidney injury) (HCC) 03/21/2020   Right pontine stroke (HCC) 03/18/2020   Gout 03/18/2020   Leukocytosis 03/18/2020   Left sided numbness 03/18/2020   Acute CHF (congestive heart failure) (HCC) 03/18/2020   Lymphedema of both lower extremities 02/13/2020   Benign hypertensive kidney disease with chronic kidney disease 12/18/2019   Proteinuria 12/18/2019   Secondary hyperparathyroidism of renal origin (HCC) 12/18/2019   Uncontrolled type 2 diabetes mellitus with hyperglycemia (HCC) 09/19/2019   History of stroke 08/30/2019   Acute CVA (cerebrovascular accident) (HCC) 07/21/2019   OSA on CPAP 12/29/2018   Necrotizing soft tissue infection  12/11/2018   CAP (community acquired pneumonia) 03/18/2018   Hypocalcemia 03/18/2018   Hypokalemia 03/18/2018   High risk medication use 06/20/2017   Pseudophakia of left eye 06/06/2017   Heart murmur, systolic 04/26/2017   Abnormal stress test 01/19/2017   Chest heaviness 01/19/2017   Chronic arthritis 09/29/2016   Anemia of chronic kidney failure, stage 4 (severe) (HCC) 05/19/2016   DDD (degenerative disc disease), lumbar 01/13/2016   Lumbar radiculitis 01/13/2016   Lumbar stenosis with neurogenic claudication 01/13/2016   Type 2  diabetes mellitus with renal complication (HCC) 10/01/2015   Epiretinal membrane (ERM) of left eye 05/01/2015   Proliferative diabetic retinopathy with macular edema associated with type 2 diabetes mellitus (HCC) 11/13/2014   Plantar fascial fibromatosis 09/02/2014   Diabetic macular edema of both eyes (HCC) 05/07/2013   Iris neovascularization 05/07/2013   Onychomycosis 02/22/2013   Pain in foot 02/22/2013   Peripheral neuropathy 02/22/2013   Wound infection after surgery 08/20/2012   Coronary artery disease 08/17/2012   Myocardial infarction (HCC) 08/16/2012   Hyperlipidemia, unspecified 08/16/2012   HTN (hypertension) 08/16/2012   Postmenopausal 08/16/2012   Wears glasses 08/16/2012   PCP:  Auston Reyes BIRCH, MD Pharmacy:   [id number] [place], [place] - [address] AT [facility] OF [address] [place] [address] Phone: [phone] Fax: [phone]  [facility] [address] [place] [address] Phone: [phone] Fax: [phone]     Social Drivers of Health (SDOH) Social History: SDOH Screenings   Food Insecurity: No Food Insecurity (07/05/2024)  Housing: Unknown (07/05/2024)  Transportation Needs: No Transportation Needs (07/05/2024)  Utilities: Not At Risk (07/05/2024)  Depression (PHQ2-9): Low Risk  (04/22/2020)  Tobacco Use: Low Risk  (07/05/2024)   SDOH Interventions: Housing Interventions: Patient Declined   Readmission Risk Interventions     No data to display

## 2024-07-06 NOTE — Plan of Care (Signed)

## 2024-07-06 NOTE — Progress Notes (Signed)
   07/06/24 1646  Vitals  Temp 97.7 F (36.5 C)  Pulse Rate 65  Resp 14  BP (!) 154/54  O2 Device Nasal Cannula  Weight 109.3 kg  Oxygen Therapy  O2 Flow Rate (L/min) 1 L/min  Patient Activity (if Appropriate) In bed  Pulse Oximetry Type Continuous  Oximetry Probe Site Changed No  Post Treatment  Dialyzer Clearance Lightly streaked  Liters Processed 75.7  Fluid Removed (mL) 0 mL  Tolerated HD Treatment Yes  AVG/AVF Arterial Site Held (minutes) 6 minutes  AVG/AVF Venous Site Held (minutes) 6 minutes   Received patient in bed to unit.  Alert and oriented.  Informed consent signed and in chart.   TX duration: 3 hours and 9 minutes. Pt treatment time reduced due to pt cramping during treatment and no longer removing fluid by order of NP-Breeze.   Patient tolerated well.  Transported back to the room  Alert, without acute distress.  Hand-off given to patient's nurse.   Access used: Left upper arm fistula Access issues: None

## 2024-07-06 NOTE — Discharge Summary (Signed)
 Physician Discharge Summary   Patient: Sharon Arias MRN: [id number] DOB: 07-27-1961  Admit date:     07/05/2024  Discharge date: 07/06/24  Discharge Physician: Delon Herald   PCP: Auston Reyes BIRCH, MD   Recommendations at discharge:   You are being discharged with home oxygen Do not miss dialysis sessions Follow up next week with Dr. Auston  Discharge Diagnoses: Principal Problem:   Acute hypoxemic respiratory failure (HCC) Active Problems:   Type 2 diabetes mellitus with renal complication (HCC)   HTN (hypertension)   History of stroke   ESRD (end stage renal disease) on dialysis (HCC)   Hypervolemia associated with renal insufficiency    Hospital Course: 63yo with h/o HTN, DM, CAD s/p CABG, chronic HFpEF, CVA, and ESRD on MWF HD who presented on 7/17 with SOB.  She has been practicing for her upcoming colonoscopy bowel prep and thinks that it led to volume overload.  O2 sats at home were 79% on RA, improved to 91% on 4L [place] O2.  She was started on BIPAP and has since been weaned to RA since undergoing HD.  She will have another HD treatment on 7/18 and then be appropriate for dc.  Assessment and Plan:  Volume overload in an ESRD on HD patient Patient presented with acute hypoxic respiratory failure with home O2 sats to 79% Usually on MWF HD This is in the setting of aggressive PO hydration (practicing for her bowel prep for colonoscopy), which caused her to feel too bad to attend HD on 7/16 She was treated with BIPAP and then [place] O2 and ultimately RA She underwent serial HD on 7/17 and 7/18 and is anticipated to d/c to home today after HD Resume home Lasix  Continue Renvela , Rena-Vit  Acute hypoxic respiratory failure Likely associated with volume overload Improved but not yet resolved She remains 88% on RA at this time and so qualifies for home O2 Offered overnight monitoring but she would prefer to dc today with O2  DM A1c 6.7, good control Continue Tresiba ,  SSI Carb modified diet    HTN BP 156/55 Continue metoprolol , amlodipine , hydralazine , losartan    H/o CVA Continue aspirin  and Plavix  Continue rosuvastatin   Leukocytosis She denies any fever or chills. Likely reactive in nature  Class 2 obesity Body mass index is 37.95 kg/m.SABRA  Weight loss should be encouraged on an ongoing basis Continue Ozempic Outpatient PCP/bariatric medicine f/u encouraged Significantly low or high BMI is associated with higher medical risk including morbidity and mortality      Consultants: Nephrology  Procedures: HD 7/17, 7/18  Antibiotics: None  30 Day Unplanned Readmission Risk Score    Flowsheet Row ED to Hosp-Admission (Current) from 07/05/2024 in [facility]  30 Day Unplanned Readmission Risk Score (%) 27.18 Filed at 07/06/2024 0400    This score is the patient's risk of an unplanned readmission within 30 days of being discharged (0 -100%). The score is based on dignosis, age, lab data, medications, orders, and past utilization.   Low:  0-14.9   Medium: 15-21.9   High: 22-29.9   Extreme: 30 and above           Pain control - [place]  Controlled Substance Reporting System database was reviewed. and patient was instructed, not to drive, operate heavy machinery, perform activities at heights, swimming or participation in water activities or provide baby-sitting services while on Pain, Sleep and Anxiety Medications; until their outpatient Physician has advised to do so again. Also  recommended to not to take more than prescribed Pain, Sleep and Anxiety Medications.   Disposition: Home Diet recommendation:  Renal diet DISCHARGE MEDICATION: Allergies as of 07/06/2024       Reactions   Ozempic (0.25 Or 0.5 Mg-dose) [semaglutide(0.25 Or 0.5mg -dos)] Diarrhea, Nausea Only   Trulicity [dulaglutide] Diarrhea, Nausea Only   Other    Anesthesia--nausea/vomiting        Medication List     STOP  taking these medications    acyclovir  200 MG capsule Commonly known as: ZOVIRAX    atorvastatin  80 MG tablet Commonly known as: LIPITOR    Lantus  SoloStar 100 UNIT/ML Solostar Pen Generic drug: insulin  glargine   meclizine  25 MG tablet Commonly known as: ANTIVERT    polyethylene glycol-electrolytes 420 g solution Commonly known as: NuLYTELY    predniSONE  20 MG tablet Commonly known as: DELTASONE        TAKE these medications    acetaminophen  325 MG tablet Commonly known as: TYLENOL  Take 2 tablets (650 mg total) by mouth every 4 (four) hours as needed for mild pain (pain score 1-3) (or temp > 37.5 C (99.5 F)).   allopurinol  300 MG tablet Commonly known as: ZYLOPRIM  Take 1 tablet (300 mg total) by mouth in the morning.   amLODipine  5 MG tablet Commonly known as: NORVASC  Take 1 tablet (5 mg total) by mouth at bedtime.   aspirin  EC 81 MG tablet Take 81 mg by mouth in the morning.   clopidogrel  75 MG tablet Commonly known as: PLAVIX  Take 1 tablet (75 mg total) by mouth daily.   furosemide  40 MG tablet Commonly known as: LASIX  Take 1 tablet (40 mg total) by mouth daily as needed for edema.   hydrALAZINE  50 MG tablet Commonly known as: APRESOLINE  Take 1 tablet (50 mg total) by mouth in the morning and at bedtime.   insulin  lispro 100 UNIT/ML KwikPen Commonly known as: HUMALOG  Inject 20 Units into the skin 3 (three) times daily with meals.   losartan  100 MG tablet Commonly known as: COZAAR  Take 1 tablet (100 mg total) by mouth at bedtime.   metoprolol  tartrate 50 MG tablet Commonly known as: LOPRESSOR  Take 1 tablet (50 mg total) by mouth 2 (two) times daily.   multivitamin Tabs tablet Take 1 tablet by mouth at bedtime.   rosuvastatin  40 MG tablet Commonly known as: CRESTOR  Take 40 mg by mouth.  Take 40 mg by mouth.   sevelamer  carbonate 800 MG tablet Commonly known as: RENVELA  Take 1 tablet (800 mg total) by mouth 3 (three) times daily.   Tresiba   FlexTouch 200 UNIT/ML FlexTouch Pen Generic drug: insulin  degludec INJECT 74 UNITS SUBCUTANEOUSLY ONCE DAILY               Durable Medical Equipment  (From admission, onward)           Start     Ordered   07/06/24 1641  DME Oxygen  Once       Question Answer Comment  Length of Need 6 Months   Mode or (Route) Nasal cannula   Frequency Continuous (stationary and portable oxygen unit needed)   Oxygen conserving device Yes   Oxygen delivery system Gas      07/06/24 1641            Discharge Exam:    Subjective: Feeling better.  Still on [facility] O2 this AM.  Eager to have another round of HD and then dc later today.   Objective: Vitals:   07/06/24 1600 07/06/24 1630  BP: (!) 154/49 (!) 171/63  Pulse: 61 64  Resp: 19 14  Temp:    SpO2: 90%     Intake/Output Summary (Last 24 hours) at 07/06/2024 1641 Last data filed at 07/06/2024 0900 Gross per 24 hour  Intake 240 ml  Output 3000 ml  Net -2760 ml   Filed Weights   07/05/24 1242 07/05/24 1644 07/06/24 1304  Weight: 113.5 kg 110.4 kg 109.9 kg    Exam:  General:  Appears calm and comfortable and is in NAD, on [place] O2 Eyes:   normal lids, iris ENT:  grossly normal hearing, lips & tongue, mmm Cardiovascular:  RRR. No LE edema.  Respiratory:   CTA bilaterally with no wheezes/rales/rhonchi.  Normal respiratory effort. Abdomen:  soft, NT, ND Skin:  no rash or induration seen on limited exam Musculoskeletal:  grossly normal tone BUE/BLE, good ROM, no bony abnormality Psychiatric:  grossly normal mood and affect, speech fluent and appropriate, AOx3 Neurologic:  CN 2-12 grossly intact, moves all extremities in coordinated fashion  Data Reviewed: I have reviewed the patient's lab results since admission.  Pertinent labs for today include:  Glucose 166 BUN 54/Creatinine 5.45/GFR 8 WBC 12.1, improving Hgb 1.02    Condition at discharge: improving  The results of significant diagnostics from this  hospitalization (including imaging, microbiology, ancillary and laboratory) are listed below for reference.   Imaging Studies: DG Chest Port 1 View Result Date: 07/05/2024 CLINICAL DATA:  Shortness of breath starting yesterday.  Hypoxia. EXAM: PORTABLE CHEST 1 VIEW COMPARISON:  11/27/2023 FINDINGS: Reverse lordotic projection with mild obscuration of the medial right lung apex due to patient facial tissues. Prior CABG.  Atherosclerotic calcification of the aortic arch. Even accounting for the apical lordotic projection, there appears to be increasing indistinct bibasilar airspace opacities with some obscuration of the hemidiaphragms against a background of indistinct pulmonary vasculature. This appearance could reflect basilar edema, aspiration pneumonitis, or multilobar pneumonia superimposed on pulmonary venous hypertension. Stable cardiomegaly. IMPRESSION: 1. Increasing indistinct bibasilar airspace opacities with some obscuration of the hemidiaphragms against a background of indistinct pulmonary vasculature. This appearance could reflect basilar edema, aspiration pneumonitis, or multilobar pneumonia superimposed on pulmonary venous hypertension. 2. Stable cardiomegaly. 3. Prior CABG. 4. Aortic Atherosclerosis (ICD10-I70.0). Electronically Signed   By: Ryan Salvage M.D.   On: 07/05/2024 10:22    Microbiology: Results for orders placed or performed during the hospital encounter of 07/05/24  Resp panel by RT-PCR (RSV, Flu A&B, Covid) Anterior Nasal Swab     Status: None   Collection Time: 07/05/24  5:36 PM   Specimen: Anterior Nasal Swab  Result Value Ref Range Status   SARS Coronavirus 2 by RT PCR NEGATIVE NEGATIVE Final    Comment: (NOTE) SARS-CoV-2 target nucleic acids are NOT DETECTED.  The SARS-CoV-2 RNA is generally detectable in upper respiratory specimens during the acute phase of infection. The lowest concentration of SARS-CoV-2 viral copies this assay can detect is 138 copies/mL.  A negative result does not preclude SARS-Cov-2 infection and should not be used as the sole basis for treatment or other patient management decisions. A negative result may occur with  improper specimen collection/handling, submission of specimen other than nasopharyngeal swab, presence of viral mutation(s) within the areas targeted by this assay, and inadequate number of viral copies(<138 copies/mL). A negative result must be combined with clinical observations, patient history, and epidemiological information. The expected result is Negative.  Fact Sheet for Patients:  BloggerCourse.com  Fact Sheet for Healthcare Providers:  SeriousBroker.it  This test is no t yet approved or cleared by the [place]  FDA and  has been authorized for detection and/or diagnosis of SARS-CoV-2 by FDA under an Emergency Use Authorization (EUA). This EUA will remain  in effect (meaning this test can be used) for the duration of the COVID-19 declaration under Section 564(b)(1) of the Act, 21 U.S.C.section 360bbb-3(b)(1), unless the authorization is terminated  or revoked sooner.       Influenza A by PCR NEGATIVE NEGATIVE Final   Influenza B by PCR NEGATIVE NEGATIVE Final    Comment: (NOTE) The Xpert Xpress SARS-CoV-2/FLU/RSV plus assay is intended as an aid in the diagnosis of influenza from Nasopharyngeal swab specimens and should not be used as a sole basis for treatment. Nasal washings and aspirates are unacceptable for Xpert Xpress SARS-CoV-2/FLU/RSV testing.  Fact Sheet for Patients: BloggerCourse.com  Fact Sheet for Healthcare Providers: SeriousBroker.it  This test is not yet approved or cleared by the [place]  FDA and has been authorized for detection and/or diagnosis of SARS-CoV-2 by FDA under an Emergency Use Authorization (EUA). This EUA will remain in effect (meaning this test  can be used) for the duration of the COVID-19 declaration under Section 564(b)(1) of the Act, 21 U.S.C. section 360bbb-3(b)(1), unless the authorization is terminated or revoked.     Resp Syncytial Virus by PCR NEGATIVE NEGATIVE Final    Comment: (NOTE) Fact Sheet for Patients: BloggerCourse.com  Fact Sheet for Healthcare Providers: SeriousBroker.it  This test is not yet approved or cleared by the [place]  FDA and has been authorized for detection and/or diagnosis of SARS-CoV-2 by FDA under an Emergency Use Authorization (EUA). This EUA will remain in effect (meaning this test can be used) for the duration of the COVID-19 declaration under Section 564(b)(1) of the Act, 21 U.S.C. section 360bbb-3(b)(1), unless the authorization is terminated or revoked.  Performed at [facility], [address] [place], [place] [address]   Culture, blood (Routine X 2) w Reflex to ID Panel     Status: None (Preliminary result)   Collection Time: 07/05/24  6:39 PM   Specimen: BLOOD  Result Value Ref Range Status   Specimen Description BLOOD BLOOD RIGHT HAND  Final   Special Requests   Final    BOTTLES DRAWN AEROBIC ONLY Blood Culture adequate volume   Culture   Final    NO GROWTH < 12 HOURS Performed at [facility], [address] [place], [place] [address]    Report Status PENDING  Incomplete  Culture, blood (Routine X 2) w Reflex to ID Panel     Status: None (Preliminary result)   Collection Time: 07/05/24  7:52 PM   Specimen: BLOOD  Result Value Ref Range Status   Specimen Description BLOOD BLOOD RIGHT HAND  Final   Special Requests   Final    BOTTLES DRAWN AEROBIC ONLY Blood Culture results may not be optimal due to an inadequate volume of blood received in culture bottles   Culture   Final    NO GROWTH < 12 HOURS Performed at [facility], [address] [place], [place] [address]    Report  Status PENDING  Incomplete    Labs: CBC: Recent Labs  Lab 07/05/24 0940 07/05/24 1310 07/06/24 0321  WBC 13.7* 14.7* 12.1*  HGB 10.8* 10.5* 10.2*  HCT 32.6* 30.9* 30.3*  MCV 92.4 91.4 90.2  PLT 256 261 261   Basic Metabolic Panel: Recent Labs  Lab 07/05/24 0940 07/05/24 1310 07/06/24  0321  NA 140  --  139  K 3.8  --  3.6  CL 104  --  104  CO2 23  --  22  GLUCOSE 156*  --  166*  BUN 47*  --  54*  CREATININE 5.05* 5.22* 5.45*  CALCIUM  9.7  --  9.8   Liver Function Tests: Recent Labs  Lab 07/06/24 0321  AST 14*  ALT 17  ALKPHOS 48  BILITOT 1.1  PROT 8.2*  ALBUMIN 4.1   CBG: Recent Labs  Lab 07/05/24 2050 07/05/24 2318 07/06/24 0322 07/06/24 0721 07/06/24 1133  GLUCAP 216* 203* 157* 132* 217*    Discharge time spent: greater than 30 minutes.  Signed: Delon Herald, MD [facility] 07/06/2024

## 2024-07-06 NOTE — Progress Notes (Signed)
 SATURATION QUALIFICATIONS: (This note is used to comply with regulatory documentation for home oxygen)  Patient Saturations on Room Air at Rest = 88%    Patient Saturations on 1 Liters of oxygen at rest =96%  Please briefly explain why patient needs home oxygen:

## 2024-07-06 NOTE — Progress Notes (Signed)
 [facility]  ROUNDING NOTE   Subjective:   Sharon Arias is a 63 year old female with past medical conditions including hypertension, diabetes, CAD status post CABG, CHF, stroke, and end-stage renal disease on hemodialysis.  Patient presents to the emergency department with shortness of breath and has been admitted for Acute pulmonary edema (HCC) [J81.0] Acute respiratory failure with hypoxia (HCC) [J96.01] ESRD on hemodialysis (HCC) [N18.6, Z99.2] Acute hypoxemic respiratory failure (HCC) [J96.01] Hypervolemia associated with renal insufficiency [E87.70, N28.9]  Patient is known our practice and receives outpatient dialysis treatments at [facility] on a MWF schedule, supervised by Dr. Douglas.   Patient seen sitting at side of bed Denies shortness of breath Feels great, feels dialysis yesterday helped her breathing  Objective:  Vital signs in last 24 hours:  Temp:  [97.6 F (36.4 C)-98.5 F (36.9 C)] 98.3 F (36.8 C) (07/18 1304) Pulse Rate:  [52-66] 52 (07/18 1430) Resp:  [13-22] 17 (07/18 1430) BP: (111-175)/(50-73) 147/55 (07/18 1430) SpO2:  [91 %-100 %] 97 % (07/18 1400) FiO2 (%):  [28 %] 28 % (07/18 0150) Weight:  [109.9 kg-110.4 kg] 109.9 kg (07/18 1304)  Weight change:  Filed Weights   07/05/24 1242 07/05/24 1644 07/06/24 1304  Weight: 113.5 kg 110.4 kg 109.9 kg    Intake/Output: I/O last 3 completed shifts: In: -  Out: 3000 [Other:3000]   Intake/Output this shift:  Total I/O In: 240 [P.O.:240] Out: -   Physical Exam: General: NAD, pleasant  Head: Normocephalic, atraumatic. Moist oral mucosal membranes  Eyes: Anicteric  Neck: Supple  Lungs:  Fine crackles, [place] O2  Heart: Regular rate and rhythm  Abdomen:  Soft, nontender  Extremities: 1+ peripheral edema.  Neurologic: Awake, alert, conversant  Skin: Warm,dry, no rash  Access: Left aVF    Basic Metabolic Panel: Recent Labs  Lab 07/05/24 0940 07/05/24 1310 07/06/24 0321  NA  140  --  139  K 3.8  --  3.6  CL 104  --  104  CO2 23  --  22  GLUCOSE 156*  --  166*  BUN 47*  --  54*  CREATININE 5.05* 5.22* 5.45*  CALCIUM  9.7  --  9.8    Liver Function Tests: Recent Labs  Lab 07/06/24 0321  AST 14*  ALT 17  ALKPHOS 48  BILITOT 1.1  PROT 8.2*  ALBUMIN 4.1   No results for input(s): LIPASE, AMYLASE in the last 168 hours. No results for input(s): AMMONIA in the last 168 hours.  CBC: Recent Labs  Lab 07/05/24 0940 07/05/24 1310 07/06/24 0321  WBC 13.7* 14.7* 12.1*  HGB 10.8* 10.5* 10.2*  HCT 32.6* 30.9* 30.3*  MCV 92.4 91.4 90.2  PLT 256 261 261    Cardiac Enzymes: No results for input(s): CKTOTAL, CKMB, CKMBINDEX, TROPONINI in the last 168 hours.  BNP: Invalid input(s): POCBNP  CBG: Recent Labs  Lab 07/05/24 2050 07/05/24 2318 07/06/24 0322 07/06/24 0721 07/06/24 1133  GLUCAP 216* 203* 157* 132* 217*    Microbiology: Results for orders placed or performed during the hospital encounter of 07/05/24  Resp panel by RT-PCR (RSV, Flu A&B, Covid) Anterior Nasal Swab     Status: None   Collection Time: 07/05/24  5:36 PM   Specimen: Anterior Nasal Swab  Result Value Ref Range Status   SARS Coronavirus 2 by RT PCR NEGATIVE NEGATIVE Final    Comment: (NOTE) SARS-CoV-2 target nucleic acids are NOT DETECTED.  The SARS-CoV-2 RNA is generally detectable in upper respiratory  specimens during the acute phase of infection. The lowest concentration of SARS-CoV-2 viral copies this assay can detect is 138 copies/mL. A negative result does not preclude SARS-Cov-2 infection and should not be used as the sole basis for treatment or other patient management decisions. A negative result may occur with  improper specimen collection/handling, submission of specimen other than nasopharyngeal swab, presence of viral mutation(s) within the areas targeted by this assay, and inadequate number of viral copies(<138 copies/mL). A negative  result must be combined with clinical observations, patient history, and epidemiological information. The expected result is Negative.  Fact Sheet for Patients:  BloggerCourse.com  Fact Sheet for Healthcare Providers:  SeriousBroker.it  This test is no t yet approved or cleared by the [place]  FDA and  has been authorized for detection and/or diagnosis of SARS-CoV-2 by FDA under an Emergency Use Authorization (EUA). This EUA will remain  in effect (meaning this test can be used) for the duration of the COVID-19 declaration under Section 564(b)(1) of the Act, 21 U.S.C.section 360bbb-3(b)(1), unless the authorization is terminated  or revoked sooner.       Influenza A by PCR NEGATIVE NEGATIVE Final   Influenza B by PCR NEGATIVE NEGATIVE Final    Comment: (NOTE) The Xpert Xpress SARS-CoV-2/FLU/RSV plus assay is intended as an aid in the diagnosis of influenza from Nasopharyngeal swab specimens and should not be used as a sole basis for treatment. Nasal washings and aspirates are unacceptable for Xpert Xpress SARS-CoV-2/FLU/RSV testing.  Fact Sheet for Patients: BloggerCourse.com  Fact Sheet for Healthcare Providers: SeriousBroker.it  This test is not yet approved or cleared by the [place]  FDA and has been authorized for detection and/or diagnosis of SARS-CoV-2 by FDA under an Emergency Use Authorization (EUA). This EUA will remain in effect (meaning this test can be used) for the duration of the COVID-19 declaration under Section 564(b)(1) of the Act, 21 U.S.C. section 360bbb-3(b)(1), unless the authorization is terminated or revoked.     Resp Syncytial Virus by PCR NEGATIVE NEGATIVE Final    Comment: (NOTE) Fact Sheet for Patients: BloggerCourse.com  Fact Sheet for Healthcare Providers: SeriousBroker.it  This  test is not yet approved or cleared by the [place]  FDA and has been authorized for detection and/or diagnosis of SARS-CoV-2 by FDA under an Emergency Use Authorization (EUA). This EUA will remain in effect (meaning this test can be used) for the duration of the COVID-19 declaration under Section 564(b)(1) of the Act, 21 U.S.C. section 360bbb-3(b)(1), unless the authorization is terminated or revoked.  Performed at [facility], [address] [place], [place] [address]   Culture, blood (Routine X 2) w Reflex to ID Panel     Status: None (Preliminary result)   Collection Time: 07/05/24  6:39 PM   Specimen: BLOOD  Result Value Ref Range Status   Specimen Description BLOOD BLOOD RIGHT HAND  Final   Special Requests   Final    BOTTLES DRAWN AEROBIC ONLY Blood Culture adequate volume   Culture   Final    NO GROWTH < 12 HOURS Performed at [facility], [address], [place], [place] [address]    Report Status PENDING  Incomplete  Culture, blood (Routine X 2) w Reflex to ID Panel     Status: None (Preliminary result)   Collection Time: 07/05/24  7:52 PM   Specimen: BLOOD  Result Value Ref Range Status   Specimen Description BLOOD BLOOD RIGHT HAND  Final   Special Requests  Final    BOTTLES DRAWN AEROBIC ONLY Blood Culture results may not be optimal due to an inadequate volume of blood received in culture bottles   Culture   Final    NO GROWTH < 12 HOURS Performed at [facility], [address] [place], [place] [address]    Report Status PENDING  Incomplete    Coagulation Studies: No results for input(s): LABPROT, INR in the last 72 hours.  Urinalysis: Recent Labs    07/05/24 1750  COLORURINE YELLOW*  LABSPEC 1.014  PHURINE 6.0  GLUCOSEU >=500*  HGBUR SMALL*  BILIRUBINUR NEGATIVE  KETONESUR NEGATIVE  PROTEINUR >=300*  NITRITE NEGATIVE  LEUKOCYTESUR NEGATIVE      Imaging: DG Chest Port 1 View Result Date:  07/05/2024 CLINICAL DATA:  Shortness of breath starting yesterday.  Hypoxia. EXAM: PORTABLE CHEST 1 VIEW COMPARISON:  11/27/2023 FINDINGS: Reverse lordotic projection with mild obscuration of the medial right lung apex due to patient facial tissues. Prior CABG.  Atherosclerotic calcification of the aortic arch. Even accounting for the apical lordotic projection, there appears to be increasing indistinct bibasilar airspace opacities with some obscuration of the hemidiaphragms against a background of indistinct pulmonary vasculature. This appearance could reflect basilar edema, aspiration pneumonitis, or multilobar pneumonia superimposed on pulmonary venous hypertension. Stable cardiomegaly. IMPRESSION: 1. Increasing indistinct bibasilar airspace opacities with some obscuration of the hemidiaphragms against a background of indistinct pulmonary vasculature. This appearance could reflect basilar edema, aspiration pneumonitis, or multilobar pneumonia superimposed on pulmonary venous hypertension. 2. Stable cardiomegaly. 3. Prior CABG. 4. Aortic Atherosclerosis (ICD10-I70.0). Electronically Signed   By: Ryan Salvage M.D.   On: 07/05/2024 10:22     Medications:     amLODipine   5 mg Oral QHS   aspirin  EC  81 mg Oral q AM   atorvastatin   80 mg Oral Daily   Chlorhexidine  Gluconate Cloth  6 each Topical Q0600   clopidogrel   75 mg Oral Daily   furosemide   40 mg Intravenous BID   heparin   5,000 Units Subcutaneous Q8H   metoprolol  tartrate  50 mg Oral BID   acetaminophen  **OR** acetaminophen , heparin , lidocaine -prilocaine , ondansetron  **OR** ondansetron  (ZOFRAN ) IV, pentafluoroprop-tetrafluoroeth, polyethylene glycol  Assessment/ Plan:  Sharon Arias is a 63 y.o.  female with past medical conditions including hypertension, diabetes, CAD status post CABG, CHF, stroke, and end-stage renal disease on hemodialysis.  Patient presents to the emergency department with shortness of breath and has been  admitted for  Acute pulmonary edema (HCC) [J81.0] Acute respiratory failure with hypoxia (HCC) [J96.01] ESRD on hemodialysis (HCC) [N18.6, Z99.2] Acute hypoxemic respiratory failure (HCC) [J96.01] Hypervolemia associated with renal insufficiency [E87.70, N28.9]   End stage renal disease on hemodialysis.  Patient already dialysis yesterday, UF 3 L achieved.  Next treatment scheduled for today, UF goal 1 L as tolerated.  Will obtain standing weight at end of treatment to determine adjustments to outpatient dry weight.  2. Acute respiratory failure, chest xray shows questionable pneumonia.  Weaned to 2 L nasal cannula.  Will attempt to wean to room air during dialysis.  3. Anemia of chronic kidney disease Lab Results  Component Value Date   HGB 10.2 (L) 07/06/2024    Hgb acceptable  4. Hypertension with chronic kidney disease, Currently receiving amlodipine , furosemide  and metoprolol .    LOS: 1 Kaylin Marcon 7/18/20253:00 PM

## 2024-07-11 LAB — CULTURE, BLOOD (ROUTINE X 2)
Culture: NO GROWTH
Culture: NO GROWTH
Special Requests: ADEQUATE

## 2024-07-30 ENCOUNTER — Encounter
Admission: RE | Disposition: A | Discharge status: Home / Self Care | Payer: Self-pay | Source: Home / Self Care | Attending: Gastroenterology

## 2024-07-30 ENCOUNTER — Encounter: Payer: Self-pay | Admitting: Gastroenterology

## 2024-07-30 ENCOUNTER — Ambulatory Visit

## 2024-07-30 ENCOUNTER — Ambulatory Visit
Admission: RE | Admit: 2024-07-30 | Discharge: 2024-07-30 | Disposition: A | Discharge status: Home / Self Care | Attending: Gastroenterology | Admitting: Gastroenterology

## 2024-07-30 ENCOUNTER — Other Ambulatory Visit: Payer: Self-pay

## 2024-07-30 DIAGNOSIS — E669 Obesity, unspecified: Secondary | ICD-10-CM | POA: Insufficient documentation

## 2024-07-30 DIAGNOSIS — D122 Benign neoplasm of ascending colon: Secondary | ICD-10-CM | POA: Insufficient documentation

## 2024-07-30 DIAGNOSIS — K5730 Diverticulosis of large intestine without perforation or abscess without bleeding: Secondary | ICD-10-CM | POA: Insufficient documentation

## 2024-07-30 DIAGNOSIS — R0602 Shortness of breath: Secondary | ICD-10-CM | POA: Diagnosis not present

## 2024-07-30 DIAGNOSIS — N2581 Secondary hyperparathyroidism of renal origin: Secondary | ICD-10-CM | POA: Insufficient documentation

## 2024-07-30 DIAGNOSIS — R195 Other fecal abnormalities: Secondary | ICD-10-CM | POA: Diagnosis not present

## 2024-07-30 DIAGNOSIS — D120 Benign neoplasm of cecum: Secondary | ICD-10-CM | POA: Insufficient documentation

## 2024-07-30 DIAGNOSIS — Z992 Dependence on renal dialysis: Secondary | ICD-10-CM | POA: Diagnosis not present

## 2024-07-30 DIAGNOSIS — Z794 Long term (current) use of insulin: Secondary | ICD-10-CM | POA: Insufficient documentation

## 2024-07-30 DIAGNOSIS — Z8673 Personal history of transient ischemic attack (TIA), and cerebral infarction without residual deficits: Secondary | ICD-10-CM | POA: Insufficient documentation

## 2024-07-30 DIAGNOSIS — E1122 Type 2 diabetes mellitus with diabetic chronic kidney disease: Secondary | ICD-10-CM | POA: Insufficient documentation

## 2024-07-30 DIAGNOSIS — G4730 Sleep apnea, unspecified: Secondary | ICD-10-CM | POA: Insufficient documentation

## 2024-07-30 DIAGNOSIS — Z79899 Other long term (current) drug therapy: Secondary | ICD-10-CM | POA: Insufficient documentation

## 2024-07-30 DIAGNOSIS — Z7901 Long term (current) use of anticoagulants: Secondary | ICD-10-CM | POA: Insufficient documentation

## 2024-07-30 DIAGNOSIS — I5032 Chronic diastolic (congestive) heart failure: Secondary | ICD-10-CM | POA: Diagnosis not present

## 2024-07-30 DIAGNOSIS — Z951 Presence of aortocoronary bypass graft: Secondary | ICD-10-CM | POA: Insufficient documentation

## 2024-07-30 DIAGNOSIS — H548 Legal blindness, as defined in USA: Secondary | ICD-10-CM | POA: Diagnosis not present

## 2024-07-30 DIAGNOSIS — I252 Old myocardial infarction: Secondary | ICD-10-CM | POA: Insufficient documentation

## 2024-07-30 DIAGNOSIS — Z6838 Body mass index (BMI) 38.0-38.9, adult: Secondary | ICD-10-CM | POA: Insufficient documentation

## 2024-07-30 DIAGNOSIS — D123 Benign neoplasm of transverse colon: Secondary | ICD-10-CM | POA: Insufficient documentation

## 2024-07-30 DIAGNOSIS — I2510 Atherosclerotic heart disease of native coronary artery without angina pectoris: Secondary | ICD-10-CM | POA: Diagnosis not present

## 2024-07-30 DIAGNOSIS — I132 Hypertensive heart and chronic kidney disease with heart failure and with stage 5 chronic kidney disease, or end stage renal disease: Secondary | ICD-10-CM | POA: Insufficient documentation

## 2024-07-30 DIAGNOSIS — N186 End stage renal disease: Secondary | ICD-10-CM | POA: Insufficient documentation

## 2024-07-30 DIAGNOSIS — Z1211 Encounter for screening for malignant neoplasm of colon: Secondary | ICD-10-CM | POA: Insufficient documentation

## 2024-07-30 DIAGNOSIS — D631 Anemia in chronic kidney disease: Secondary | ICD-10-CM | POA: Insufficient documentation

## 2024-07-30 DIAGNOSIS — E785 Hyperlipidemia, unspecified: Secondary | ICD-10-CM | POA: Insufficient documentation

## 2024-07-30 HISTORY — PX: COLONOSCOPY: SHX5424

## 2024-07-30 HISTORY — PX: POLYPECTOMY: SHX149

## 2024-07-30 LAB — GLUCOSE, CAPILLARY: Glucose-Capillary: 99 mg/dL (ref 70–99)

## 2024-07-30 SURGERY — COLONOSCOPY
Anesthesia: General

## 2024-07-30 MED ORDER — EPHEDRINE SULFATE-NACL 50-0.9 MG/10ML-% IV SOSY
PREFILLED_SYRINGE | INTRAVENOUS | Status: DC | PRN
  Administered 2024-07-30 (×2): 10 mg via INTRAVENOUS

## 2024-07-30 MED ORDER — LIDOCAINE HCL (PF) 2 % IJ SOLN
INTRAMUSCULAR | Status: DC | PRN
  Administered 2024-07-30 (×2): 100 mg via INTRADERMAL

## 2024-07-30 MED ORDER — PROPOFOL 1000 MG/100ML IV EMUL
INTRAVENOUS | Status: AC
  Filled 2024-07-30: qty 100

## 2024-07-30 MED ORDER — LIDOCAINE HCL (PF) 2 % IJ SOLN
INTRAMUSCULAR | Status: AC
  Filled 2024-07-30: qty 5

## 2024-07-30 MED ORDER — EPHEDRINE 5 MG/ML INJ
INTRAVENOUS | Status: AC
  Filled 2024-07-30: qty 5

## 2024-07-30 MED ORDER — PHENYLEPHRINE 80 MCG/ML (10ML) SYRINGE FOR IV PUSH (FOR BLOOD PRESSURE SUPPORT)
PREFILLED_SYRINGE | INTRAVENOUS | Status: DC | PRN
Start: 2024-07-30 — End: 2024-07-30
  Administered 2024-07-30 (×2): 160 ug via INTRAVENOUS

## 2024-07-30 MED ORDER — SODIUM CHLORIDE 0.9 % IV SOLN: INTRAVENOUS | Status: DC

## 2024-07-30 MED ORDER — PROPOFOL 500 MG/50ML IV EMUL
INTRAVENOUS | Status: DC | PRN
  Administered 2024-07-30 (×2): 150 ug/kg/min via INTRAVENOUS
  Administered 2024-07-30 (×2): 50 mg via INTRAVENOUS

## 2024-07-30 NOTE — Op Note (Signed)
 [facility] Gastroenterology Patient Name: Sharon Arias Procedure Date: 07/30/2024 7:16 AM MRN: [id number] Account #: [id number] Date of Birth: 1961/03/03 Admit Type: Outpatient Age: 63 Room: [facility] ENDO ROOM 2 Gender: Female Note Status: Finalized Instrument Name: Colon Scope [id number] Procedure:             Colonoscopy Indications:           Positive Cologuard test Providers:             Elspeth Ozell Onita ROSALEA, DO Referring MD:          Reyes BIRCH. Auston, MD (Referring MD) Medicines:             Monitored Anesthesia Care Complications:         No immediate complications. Estimated blood loss:                         Minimal. Procedure:             Pre-Anesthesia Assessment:                        - Prior to the procedure, a History and Physical was                         performed, and patient medications and allergies were                         reviewed. The patient is competent. The risks and                         benefits of the procedure and the sedation options and                         risks were discussed with the patient. All questions                         were answered and informed consent was obtained.                         Patient identification and proposed procedure were                         verified by the physician, the nurse, the anesthetist                         and the technician in the endoscopy suite. Mental                         Status Examination: alert and oriented. Airway                         Examination: normal oropharyngeal airway and neck                         mobility. Respiratory Examination: clear to                         auscultation. CV Examination: RRR, no murmurs, no S3  or S4. Prophylactic Antibiotics: The patient does not                         require prophylactic antibiotics. Prior                         Anticoagulants: The patient has taken Plavix                           (clopidogrel ), last dose was 5 days prior to                         procedure. ASA Grade Assessment: IV - A patient with                         severe systemic disease that is a constant threat to                         life. After reviewing the risks and benefits, the                         patient was deemed in satisfactory condition to                         undergo the procedure. The anesthesia plan was to use                         monitored anesthesia care (MAC). Immediately prior to                         administration of medications, the patient was                         re-assessed for adequacy to receive sedatives. The                         heart rate, respiratory rate, oxygen saturations,                         blood pressure, adequacy of pulmonary ventilation, and                         response to care were monitored throughout the                         procedure. The physical status of the patient was                         re-assessed after the procedure.                        After obtaining informed consent, the colonoscope was                         passed under direct vision. Throughout the procedure,                         the patient's blood pressure, pulse, and oxygen  saturations were monitored continuously. The                         Colonoscope was introduced through the anus and                         advanced to the the cecum, identified by appendiceal                         orifice and ileocecal valve. The colonoscopy was                         performed without difficulty. The patient tolerated                         the procedure well. The quality of the bowel                         preparation was evaluated using the BBPS [facility] Bowel                         Preparation Scale) with scores of: Right Colon = 3                         (entire mucosa seen well with no residual staining,                          small fragments of stool or opaque liquid), Transverse                         Colon = 3 (entire mucosa seen well with no residual                         staining, small fragments of stool or opaque liquid)                         and Left Colon = 2 (minor amount of residual staining,                         small fragments of stool and/or opaque liquid, but                         mucosa seen well). The total BBPS score equals 8. The                         quality of the bowel preparation was excellent. The                         ileocecal valve, appendiceal orifice, and rectum were                         photographed. Findings:      The perianal and digital rectal examinations were normal. Pertinent       negatives include normal sphincter tone.      A 3 to 4 mm polyp was found in the cecum. The polyp was sessile. The       polyp was  removed with a cold snare. Resection and retrieval were       complete. Estimated blood loss was minimal.      A 13 to 14 mm polyp was found in the transverse colon. The polyp was       pedunculated. The polyp was removed with a hot snare. Resection and       retrieval were complete. Estimated blood loss was minimal.      Two sessile polyps were found in the transverse colon and ascending       colon. The polyps were 1 to 2 mm in size. These polyps were removed with       a cold biopsy forceps. Resection and retrieval were complete. Estimated       blood loss was minimal.      The exam was otherwise without abnormality on direct and retroflexion       views. Impression:            - One 3 to 4 mm polyp in the cecum, removed with a                         cold snare. Resected and retrieved.                        - One 13 to 14 mm polyp in the transverse colon,                         removed with a hot snare. Resected and retrieved.                        - Two 1 to 2 mm polyps in the transverse colon and in                         the ascending colon,  removed with a cold biopsy                         forceps. Resected and retrieved.                        - The examination was otherwise normal on direct and                         retroflexion views. Recommendation:        - Patient has a contact number available for                         emergencies. The signs and symptoms of potential                         delayed complications were discussed with the patient.                         Return to normal activities tomorrow. Written                         discharge instructions were provided to the patient.                        - Discharge patient to home.                        -  Resume previous diet.                        - Continue present medications.                        - No ibuprofen, naproxen, or other non-steroidal                         anti-inflammatory drugs for 5 days after polyp removal.                        - Resume Plavix  (clopidogrel ) at prior dose in 2 days.                         Refer to managing physician for further adjustment of                         therapy.                        - Await pathology results.                        - Repeat colonoscopy in 3 years for surveillance based                         on pathology results.                        - Return to referring physician as previously                         scheduled.                        - Plan for same 4 day prep as performed this time.                        - The findings and recommendations were discussed with                         the patient. Procedure Code(s):     --- Professional ---                        [phone], Colonoscopy, flexible; with removal of                         tumor(s), polyp(s), or other lesion(s) by snare                         technique                        [id number], 59, Colonoscopy, flexible; with biopsy, single                         or multiple Diagnosis Code(s):     --- Professional ---                         D12.0, Benign neoplasm  of cecum                        D12.3, Benign neoplasm of transverse colon (hepatic                         flexure or splenic flexure)                        D12.2, Benign neoplasm of ascending colon                        R19.5, Other fecal abnormalities CPT copyright 2022 American Medical Association. All rights reserved. The codes documented in this report are preliminary and upon coder review may  be revised to meet current compliance requirements. Attending Participation:      I personally performed the entire procedure. Elspeth Jungling, DO Elspeth Ozell Jungling DO, DO 07/30/2024 8:22:42 AM This report has been signed electronically. Number of Addenda: 0 Note Initiated On: 07/30/2024 7:16 AM Scope Withdrawal Time: 0 hours 22 minutes 49 seconds  Total Procedure Duration: 0 hours 30 minutes 55 seconds  Estimated Blood Loss:  Estimated blood loss was minimal.      [facility]

## 2024-07-30 NOTE — H&P (Signed)
 Pre-Procedure H&P   Patient ID: Sharon Arias is a 63 y.o. female.  Gastroenterology Provider: Elspeth Ozell Jungling, DO  Referring Provider: Romero Antigua, PA PCP: Auston Reyes BIRCH, MD  Date: 07/30/2024  HPI Sharon Arias is a 63 y.o. female who presents today for Colonoscopy for Positive Cologuard .  Cologuard was positive in April 2025.  Attempted colonoscopy more recently, however, prep was inadequate.  Today she was able to complete multiday prep in hopes of better visualization.  She reports daily bowel movement without melena or hematochezia.  No other GI symptoms  Last colonoscopy in February 2017 with 4 adenomatous polyps including 10 mm splenic flexure polyp.  Fair prep at that time.  Sigmoid diverticulosis also appreciated  Last dose of Plavix  last Wednesday  Hemoglobin 10.5 MCV 91 platelets 261,000 Scheduled for dialysis tomorrow   Past Medical History:  Diagnosis Date   Anemia in chronic kidney disease 05/19/2016   Anxiety    Aortic atherosclerosis (HCC)    Blind    CAD (coronary artery disease)    Cardiac murmur    CHF (congestive heart failure) (HCC)    G2DD on 02/2020 TTE   Chicken pox    Chronic anticoagulation    CVA (cerebral vascular accident) (HCC)    DDD (degenerative disc disease), lumbar    Detached retina    Diabetic retinopathy (HCC)    legally blind   Diastolic congestive heart failure (HCC)    Dyspnea    ESRD needing dialysis (HCC)    Hx of CABG    Hyperlipidemia    Hypertension    NSTEMI (non-ST elevated myocardial infarction) (HCC) 06/2012   Obesity    Pneumonia    PONV (postoperative nausea and vomiting)    Sciatica of right side    Secondary hyperparathyroidism of renal origin (HCC)    Sleep apnea    CPAP NIGHTLY   Stage 5 chronic kidney disease (HCC)    T2DM (type 2 diabetes mellitus) (HCC)     Past Surgical History:  Procedure Laterality Date   A/V FISTULAGRAM Left 04/21/2021   Procedure: A/V FISTULAGRAM;   Surgeon: Jama Cordella MATSU, MD;  Location: [facility];  Service: Cardiovascular;  Laterality: Left;   A/V FISTULAGRAM Left 11/02/2022   Procedure: A/V Fistulagram;  Surgeon: Jama Cordella MATSU, MD;  Location: [facility];  Service: Cardiovascular;  Laterality: Left;   AV FISTULA PLACEMENT Left 02/06/2021   Procedure: ARTERIOVENOUS (AV) FISTULA CREATION (BRACHIAL CEPHALIC );  Surgeon: Jama Cordella MATSU, MD;  Location: [facility];  Service: Vascular;  Laterality: Left;   COLONOSCOPY N/A 06/14/2024   Procedure: COLONOSCOPY;  Surgeon: Jungling Elspeth Ozell, DO;  Location: [facility] ENDOSCOPY;  Service: Gastroenterology;  Laterality: N/A;  IDDM  Patient on Plavix    COLONOSCOPY WITH PROPOFOL  N/A 01/26/2016   Procedure: COLONOSCOPY WITH PROPOFOL ;  Surgeon: Gladis RAYMOND Mariner, MD;  Location: [facility] ENDOSCOPY;  Service: Endoscopy;  Laterality: N/A;   COLONOSCOPY WITH PROPOFOL  N/A 01/27/2016   Procedure: COLONOSCOPY WITH PROPOFOL ;  Surgeon: Gladis RAYMOND Mariner, MD;  Location: [facility] ENDOSCOPY;  Service: Endoscopy;  Laterality: N/A;   CORONARY ARTERY BYPASS GRAFT  2013   DIALYSIS/PERMA CATHETER INSERTION N/A 02/03/2021   Procedure: DIALYSIS/PERMA CATHETER INSERTION;  Surgeon: Jama Cordella MATSU, MD;  Location: [facility];  Service: Cardiovascular;  Laterality: N/A;   DIALYSIS/PERMA CATHETER INSERTION N/A 06/16/2021   Procedure: DIALYSIS/PERMA CATHETER INSERTION;  Surgeon: Marea Selinda RAMAN, MD;  Location: [facility];  Service: Cardiovascular;  Laterality: N/A;   DIALYSIS/PERMA CATHETER REMOVAL N/A 09/22/2021   Procedure: DIALYSIS/PERMA CATHETER REMOVAL;  Surgeon: Jama Cordella MATSU, MD;  Location: [facility];  Service: Cardiovascular;  Laterality: N/A;   INCISION AND DRAINAGE     chest abscess   INCISION AND DRAINAGE ABSCESS N/A 12/11/2018   Procedure: INCISION AND DRAINAGE PERINEAL;  Surgeon: Jordis Laneta FALCON, MD;  Location: [facility];  Service: General;  Laterality: N/A;   RETINAL  LASER PROCEDURE      Family History No h/o GI disease or malignancy  Review of Systems  Constitutional:  Negative for activity change, appetite change, chills, diaphoresis, fatigue, fever and unexpected weight change.  HENT:  Negative for trouble swallowing and voice change.   Respiratory:  Negative for shortness of breath and wheezing.   Cardiovascular:  Negative for chest pain, palpitations and leg swelling.  Gastrointestinal:  Negative for abdominal distention, abdominal pain, anal bleeding, blood in stool, constipation, diarrhea, nausea, rectal pain and vomiting.  Musculoskeletal:  Negative for arthralgias and myalgias.  Skin:  Negative for color change and pallor.  Neurological:  Negative for dizziness, syncope and weakness.  Psychiatric/Behavioral:  Negative for confusion.   All other systems reviewed and are negative.    Medications No current facility-administered medications on file prior to encounter.   Current Outpatient Medications on File Prior to Encounter  Medication Sig Dispense Refill   allopurinol  (ZYLOPRIM ) 300 MG tablet Take 1 tablet (300 mg total) by mouth in the morning. 30 tablet 0   amLODipine  (NORVASC ) 5 MG tablet Take 1 tablet (5 mg total) by mouth at bedtime. 30 tablet 0   aspirin  EC 81 MG tablet Take 81 mg by mouth in the morning.     furosemide  (LASIX ) 40 MG tablet Take 1 tablet (40 mg total) by mouth daily as needed for edema. 30 tablet 0   hydrALAZINE  (APRESOLINE ) 50 MG tablet Take 1 tablet (50 mg total) by mouth in the morning and at bedtime. 60 tablet 0   insulin  lispro (HUMALOG ) 100 UNIT/ML KwikPen Inject 20 Units into the skin 3 (three) times daily with meals. 15 mL 11   losartan  (COZAAR ) 100 MG tablet Take 1 tablet (100 mg total) by mouth at bedtime. 30 tablet 0   metoprolol  tartrate (LOPRESSOR ) 50 MG tablet Take 1 tablet (50 mg total) by mouth 2 (two) times daily. 60 tablet 0   multivitamin (RENA-VIT) TABS tablet Take 1 tablet by mouth at bedtime.      rosuvastatin  (CRESTOR ) 40 MG tablet Take 40 mg by mouth.  Take 40 mg by mouth.     sevelamer  carbonate (RENVELA ) 800 MG tablet Take 1 tablet (800 mg total) by mouth 3 (three) times daily. 30 tablet 0   TRESIBA  FLEXTOUCH 200 UNIT/ML FlexTouch Pen INJECT 74 UNITS SUBCUTANEOUSLY ONCE DAILY     acetaminophen  (TYLENOL ) 325 MG tablet Take 2 tablets (650 mg total) by mouth every 4 (four) hours as needed for mild pain (pain score 1-3) (or temp > 37.5 C (99.5 F)).     clopidogrel  (PLAVIX ) 75 MG tablet Take 1 tablet (75 mg total) by mouth daily. 30 tablet 0    Pertinent medications related to GI and procedure were reviewed by me with the patient prior to the procedure   Current Facility-Administered Medications:    0.9 %  sodium chloride  infusion, , Intravenous, Continuous, Onita Elspeth Sharper, DO  sodium chloride          Allergies  Allergen Reactions  Ozempic (0.25 Or 0.5 Mg-Dose) [Semaglutide(0.25 Or 0.5mg -Dos)] Diarrhea and Nausea Only   Trulicity [Dulaglutide] Diarrhea and Nausea Only   Other     Anesthesia--nausea/vomiting   Allergies were reviewed by me prior to the procedure  Objective   Body mass index is 38.06 kg/m. Vitals:   07/30/24 0723  BP: (!) 167/60  Pulse: 65  Resp: 16  Temp: (!) 96.5 F (35.8 C)  TempSrc: Temporal  SpO2: 99%  Weight: 110.2 kg  Height: 5' 7 (1.702 m)     Physical Exam Vitals and nursing note reviewed.  Constitutional:      General: She is not in acute distress.    Appearance: Normal appearance. She is obese. She is not ill-appearing, toxic-appearing or diaphoretic.  HENT:     Head: Normocephalic and atraumatic.     Nose: Nose normal.     Mouth/Throat:     Mouth: Mucous membranes are moist.     Pharynx: Oropharynx is clear.  Eyes:     General: No scleral icterus.    Extraocular Movements: Extraocular movements intact.  Cardiovascular:     Rate and Rhythm: Normal rate and regular rhythm.     Heart sounds: Normal heart sounds. No  murmur heard.    No friction rub. No gallop.  Pulmonary:     Effort: Pulmonary effort is normal. No respiratory distress.     Breath sounds: Normal breath sounds. No wheezing, rhonchi or rales.  Abdominal:     General: Bowel sounds are normal. There is no distension.     Palpations: Abdomen is soft.     Tenderness: There is no abdominal tenderness. There is no guarding or rebound.  Musculoskeletal:     Cervical back: Neck supple.     Right lower leg: No edema.     Left lower leg: No edema.  Skin:    General: Skin is warm and dry.     Coloration: Skin is not jaundiced or pale.  Neurological:     General: No focal deficit present.     Mental Status: She is alert and oriented to person, place, and time. Mental status is at baseline.  Psychiatric:        Mood and Affect: Mood normal.        Behavior: Behavior normal.        Thought Content: Thought content normal.        Judgment: Judgment normal.      Assessment:  Sharon Arias is a 63 y.o. female  who presents today for Colonoscopy for Positive Cologuard .  Plan:  Colonoscopy with possible intervention today  Colonoscopy with possible biopsy, control of bleeding, polypectomy, and interventions as necessary has been discussed with the patient/patient representative. Informed consent was obtained from the patient/patient representative after explaining the indication, nature, and risks of the procedure including but not limited to death, bleeding, perforation, missed neoplasm/lesions, cardiorespiratory compromise, and reaction to medications. Opportunity for questions was given and appropriate answers were provided. Patient/patient representative has verbalized understanding is amenable to undergoing the procedure.   Elspeth Ozell Jungling, DO  [facility] Gastroenterology  Portions of the record may have been created with voice recognition software. Occasional wrong-word or 'sound-a-like' substitutions may have occurred  due to the inherent limitations of voice recognition software.  Read the chart carefully and recognize, using context, where substitutions may have occurred.

## 2024-07-30 NOTE — Anesthesia Postprocedure Evaluation (Signed)
 Anesthesia Post Note  Patient: Sharon Arias  Procedure(s) Performed: COLONOSCOPY POLYPECTOMY, INTESTINE  Patient location during evaluation: PACU Anesthesia Type: General Level of consciousness: awake and alert Pain management: pain level controlled Vital Signs Assessment: post-procedure vital signs reviewed and stable Respiratory status: spontaneous breathing, nonlabored ventilation, respiratory function stable and patient connected to nasal cannula oxygen Cardiovascular status: blood pressure returned to baseline and stable Postop Assessment: no apparent nausea or vomiting Anesthetic complications: no   There were no known notable events for this encounter.   Last Vitals:  Vitals:   07/30/24 0832 07/30/24 0842  BP: (!) 129/49 (!) 145/52  Pulse: 70 66  Resp: 16 12  Temp:    SpO2: 98% 97%    Last Pain:  Vitals:   07/30/24 0842  TempSrc:   PainSc: 0-No pain                 Lynwood KANDICE Clause

## 2024-07-30 NOTE — Addendum Note (Signed)
 Addendum  created 07/30/24 1359 by Eddi Hymes R, CRNA   Flowsheet accepted

## 2024-07-30 NOTE — Interval H&P Note (Signed)
 History and Physical Interval Note: Preprocedure H&P from 07/30/24  was reviewed and there was no interval change after seeing and examining the patient.  Written consent was obtained from the patient after discussion of risks, benefits, and alternatives. Patient has consented to proceed with Colonoscopy with possible intervention   07/30/2024 7:33 AM  Sharon Arias  has presented today for surgery, with the diagnosis of R19.5 (ICD-10-CM) - Positive colorectal cancer screening using Cologuard test.  The various methods of treatment have been discussed with the patient and family. After consideration of risks, benefits and other options for treatment, the patient has consented to  Procedure(s) with comments: COLONOSCOPY (N/A) - 1st  AM case per Onita as a surgical intervention.  The patient's history has been reviewed, patient examined, no change in status, stable for surgery.  I have reviewed the patient's chart and labs.  Questions were answered to the patient's satisfaction.     Elspeth Ozell Onita

## 2024-07-30 NOTE — Transfer of Care (Addendum)
 Immediate Anesthesia Transfer of Care Note  Patient: Sharon Arias  Procedure(s) Performed: COLONOSCOPY POLYPECTOMY, INTESTINE  Patient Location: PACU  Anesthesia Type:General  Level of Consciousness: awake  Airway & Oxygen Therapy: Patient Spontanous Breathing  Post-op Assessment: Report given to RN and Post -op Vital signs reviewed and stable  Post vital signs: Reviewed and stable  Last Vitals:  Vitals Value Taken Time  BP 120/43 07/30/24 08:21  Temp    Pulse 68 07/30/24 08:21  Resp 13 07/30/24 08:21  SpO2 99 % 07/30/24 08:21  Vitals shown include unfiled device data.  Last Pain:  Vitals:   07/30/24 0723  TempSrc: Temporal  PainSc: 0-No pain         Complications: There were no known notable events for this encounter.

## 2024-07-30 NOTE — Anesthesia Preprocedure Evaluation (Addendum)
 Anesthesia Evaluation  Patient identified by MRN, date of birth, ID band Patient awake    Reviewed: Allergy & Precautions, H&P , NPO status , Patient's Chart, lab work & pertinent test results, reviewed documented beta blocker date and time   History of Anesthesia Complications (+) PONV and history of anesthetic complications  Airway Mallampati: III   Neck ROM: full    Dental  (+) Poor Dentition   Pulmonary shortness of breath and with exertion, sleep apnea and Continuous Positive Airway Pressure Ventilation , pneumonia, resolved Recent admission but now better with HD on last Friday.   Pulmonary exam normal        Cardiovascular Exercise Tolerance: Poor hypertension, On Medications + CAD, + Past MI, +CHF and + DOE  (-) Orthopnea and (-) PND Normal cardiovascular exam+ Valvular Problems/Murmurs  Rhythm:regular Rate:Normal     Neuro/Psych   Anxiety      Neuromuscular disease CVA  negative psych ROS   GI/Hepatic negative GI ROS, Neg liver ROS,,,  Endo/Other  diabetes    Renal/GU Dialysis and ESRFRenal disease  negative genitourinary   Musculoskeletal   Abdominal   Peds  Hematology  (+) Blood dyscrasia, anemia   Anesthesia Other Findings Past Medical History: 05/19/2016: Anemia in chronic kidney disease No date: Anxiety No date: Aortic atherosclerosis (HCC) No date: Blind No date: CAD (coronary artery disease) No date: Cardiac murmur No date: CHF (congestive heart failure) (HCC)     Comment:  G2DD on 02/2020 TTE No date: Chicken pox No date: Chronic anticoagulation No date: CVA (cerebral vascular accident) (HCC) No date: DDD (degenerative disc disease), lumbar No date: Detached retina No date: Diabetic retinopathy (HCC)     Comment:  legally blind No date: Diastolic congestive heart failure (HCC) No date: Dyspnea No date: ESRD needing dialysis (HCC) No date: Hx of CABG No date: Hyperlipidemia No date:  Hypertension 06/2012: NSTEMI (non-ST elevated myocardial infarction) (HCC) No date: Obesity No date: Pneumonia No date: PONV (postoperative nausea and vomiting) No date: Sciatica of right side No date: Secondary hyperparathyroidism of renal origin (HCC) No date: Sleep apnea     Comment:  CPAP NIGHTLY No date: Stage 5 chronic kidney disease (HCC) No date: T2DM (type 2 diabetes mellitus) (HCC) Past Surgical History: 04/21/2021: A/V FISTULAGRAM; Left     Comment:  Procedure: A/V FISTULAGRAM;  Surgeon: Jama Cordella MATSU, MD;  Location: [facility];  Service:               Cardiovascular;  Laterality: Left; 11/02/2022: A/V FISTULAGRAM; Left     Comment:  Procedure: A/V Fistulagram;  Surgeon: Jama Cordella MATSU, MD;  Location: [facility];  Service:               Cardiovascular;  Laterality: Left; 02/06/2021: AV FISTULA PLACEMENT; Left     Comment:  Procedure: ARTERIOVENOUS (AV) FISTULA CREATION (BRACHIAL              CEPHALIC );  Surgeon: Jama Cordella MATSU, MD;  Location:               [facility] ORS;  Service: Vascular;  Laterality: Left; 06/14/2024: COLONOSCOPY; N/A     Comment:  Procedure: COLONOSCOPY;  Surgeon: Onita Elspeth Sharper,              DO;  Location: [facility]  ENDOSCOPY;  Service:               Gastroenterology;  Laterality: N/A;  IDDM  Patient on               Plavix  01/26/2016: COLONOSCOPY WITH PROPOFOL ; N/A     Comment:  Procedure: COLONOSCOPY WITH PROPOFOL ;  Surgeon: Gladis RAYMOND Mariner, MD;  Location: [facility] ENDOSCOPY;  Service:               Endoscopy;  Laterality: N/A; 01/27/2016: COLONOSCOPY WITH PROPOFOL ; N/A     Comment:  Procedure: COLONOSCOPY WITH PROPOFOL ;  Surgeon: Gladis RAYMOND Mariner, MD;  Location: [facility] ENDOSCOPY;  Service:               Endoscopy;  Laterality: N/A; 2013: CORONARY ARTERY BYPASS GRAFT 02/03/2021: DIALYSIS/PERMA CATHETER INSERTION; N/A     Comment:  Procedure: DIALYSIS/PERMA CATHETER  INSERTION;  Surgeon:               Jama Cordella MATSU, MD;  Location: [facility];               Service: Cardiovascular;  Laterality: N/A; 06/16/2021: DIALYSIS/PERMA CATHETER INSERTION; N/A     Comment:  Procedure: DIALYSIS/PERMA CATHETER INSERTION;  Surgeon:               Marea Selinda RAMAN, MD;  Location: [facility];                Service: Cardiovascular;  Laterality: N/A; 09/22/2021: DIALYSIS/PERMA CATHETER REMOVAL; N/A     Comment:  Procedure: DIALYSIS/PERMA CATHETER REMOVAL;  Surgeon:               Jama Cordella MATSU, MD;  Location: [facility];               Service: Cardiovascular;  Laterality: N/A; No date: INCISION AND DRAINAGE     Comment:  chest abscess 12/11/2018: INCISION AND DRAINAGE ABSCESS; N/A     Comment:  Procedure: INCISION AND DRAINAGE PERINEAL;  Surgeon:               Jordis Laneta FALCON, MD;  Location: [facility] ORS;  Service:               General;  Laterality: N/A; No date: RETINAL LASER PROCEDURE   Reproductive/Obstetrics negative OB ROS                              Anesthesia Physical Anesthesia Plan  ASA: 4  Anesthesia Plan: General   Post-op Pain Management:    Induction:   PONV Risk Score and Plan:   Airway Management Planned:   Additional Equipment:   Intra-op Plan:   Post-operative Plan:   Informed Consent: I have reviewed the patients History and Physical, chart, labs and discussed the procedure including the risks, benefits and alternatives for the proposed anesthesia with the patient or authorized representative who has indicated his/her understanding and acceptance.     Dental Advisory Given  Plan Discussed with: CRNA  Anesthesia Plan Comments:          Anesthesia Quick Evaluation

## 2024-07-31 ENCOUNTER — Encounter: Payer: Self-pay | Admitting: Gastroenterology

## 2024-08-01 LAB — SURGICAL PATHOLOGY

## 2024-08-14 NOTE — Progress Notes (Addendum)
 Established Patient Visit   Chief Complaint: No chief complaint on file.  Date of Service: 08/14/2024 Date of Birth: 14-Jun-1961 PCP: Auston Reyes BIRCH, MD [address] [facility] [place] [address]  History of Present Illness:   Sharon Arias is a 63 y.o.female patient that presents for 6 week follow up.   Presents for f/u r/t: 1.  CABG x 3 with LIMA to LAD, SVG to OM1 and OM2, 2013             2.  Hypertension             3.  Type 2 diabetes with neuropathy             4.  Hyperlipidemia             5.  Stage V chronic kidney disease, on hemodialysis MWF              6.  Patent LIMA-LAD; SVG to OM1 and OM2 not visualized, DES mid RCA, 01/20/2017              7.  OSA on CPAP             8.  Fournier's gangrene 12/11/2018             9.  Subacute right parietal stroke 07/2019  10. CVA 11/2023, left brachium pontus ischemic stroke  11. Bell's palsy with left facial paraylsis  12. H/O BPPV resolved previously with Dix-Hallpike  13. Mild mitral stenosis by 2D ECHO 11/27/23, non-rheumatic   - MVA VTI 1.22 cm2, peak grad 12.7 mmHg, mean grad 4.0 mmHg  14. Anemia of chronic disease  15. H/O diastolic HF  Presented to [facility] 11/26/2023 with progressive dizziness that started during a session of her dialysis as well as blurred vision with headache. MRI/MRA of the head showed acute infarct in the brachium pontis on the left. Severe narrowing in the ophthalmic segment of right ICA. Neurology follow-up did not note any acute findings. Patient remained on aspirin  and Plavix  for CVA prophylaxis follow-up neurology services. Patient did experience some left facial droop likely Bell's palsy MRI 12/22 without new lesions or changes neurology consulted agreed with tentative diagnosis of Bell's palsy started on acyclovir  and prednisone .  Echocardiogram 11/27/23 with ejection fraction of 55 to 60% no wall motion abnormalities, mild mitral stenosis (MVA VTI 1.22 cm2, peak grad 12.7 mmHg, mean grad 4.0  mmHg), negative Bubble study.   She presented to [facility] ED 05/10/24 due to syncopal episode. She was in the vascular surgery clinic, approached the front desk but felt lightheaded after standing. Woke up on the ground. Large hematoma to occiput. Believes she was only unconscious for a few seconds. Without preceding chest pain or dyspnea. BP elevated 167/58, HR 66 in ED. ECG rate 76, no ST elevation or depression, no significant repolarization abnormality, normal axis, normal intervals. No evidence of ischemia or arrhythmia per ED physician interpretation. CT Head with large subdural hematoma.   Seen 05/29/24 for follow up. Reported dizziness since CVA in 11/2023, chronic with improvement since CVA but persistence. Noted with moving head and with bending and standing back up. Without worsening. She has h/o BPPV, previously improved with Dix-Hallpike. Seeing ENT 7/1. Additionally has Bell's Palsy. She has ESRD and is on HD MWF. She reports fluctuations in blood pressure. Drops during dialysis. SBP 140s-150s sometimes after HD. She will take hydralazine  and metoprolol  evening doses on HD days if SBP 140 or higher. Diastolic  congestive heart failure history with Lasix  Tuesday Thursday Saturday Sunday (non-HD days). Denies edema, orthopnea, weight changes. Limiting fluid intake per Nephrology. Denies dyspnea. No recurrent syncope, palpitations, or chest pain. Repeat 2D ECHO ordered with new mitral stenosis on 11/2023 ECHO during hospitalization for CVA. Negative Bubble study. No rheumatic disease history. 14-day Holter with recent syncope and recurrent CVA on DAPT 11/2023.   She presents for follow up today to discuss ECHO results. 2D ECHO 06/28/24 revealed EF 40%, G3DD, no significant valvular regurgitation or stenosis. She was admitted at [facility] with acute HF and acute respiratory failure 07/05/24 with serial HD sessions to remove fluid. Admission precipitated by increased PO intake with bowel prep for colonoscopy and  increased salt intake (1 meal). Sats improved with fluid removal to 88% on RA. Qualified for home O2 but pt declined. Denies dyspnea, edema, or wt gain since this visit. SPO2 95% on RA today. Following low salt diet. GDMT: amlodipine , hydralazine , losartan , metoprolol  tartrate. Lasix  on non-HD days. Continues MWF HD schedule. Denies chest pain or palpitations. She continues to have intermittent dizziness. This chronic since her CVA and is improved. Notes mostly with looking up. None otherwise. She is visually impaired with chronic cloudy vision without any worsening. Denies presyncope or syncope. Cardiac dizziness w/u has included: - 14-day Holter - pt mailed - Andrez Journey, CMA checked [employer], lost - 2D ECHO as above - MRA Neck 11/26/23 - The aortic arch, common carotid arteries, proximal internal  carotid arteries, and V1 segments of bilateral vertebral arteries  are poorly visualized and incompletely assessed due to the degree of  respiratory motion artifact. The mid to distal segments of the  carotid and vertebral arteries are normal in appearance.  - ENT w/u negative - Referred to Neuro by PCP  Past Medical and Surgical History  Past Medical History Past Medical History:  Diagnosis Date  . Anemia   . Aortic atherosclerosis ()    on CT imaging 11/2018  . Arrhythmia   . Arthritis   . CAD (coronary artery disease)   . Chickenpox   . Chronic kidney disease   . CVA (cerebrovascular accident) (CMS/HHS-HCC) 11/2023  . Diabetic neuropathy (CMS/HHS-HCC)   . Diabetic retinopathy (CMS/HHS-HCC)    legally blind  . Diverticulosis 01/27/2016  . Fournier's gangrene in female (CMS/HHS-HCC) 11/2018  . Gout   . Hepatic steatosis    on CT imaging 11/2018  . History of cataract   . Hyperlipidemia   . Hypertension   . Iron deficiency anemia   . MI (myocardial infarction) (CMS/HHS-HCC) 07/12/2012  . Multiple thyroid  nodules   . Obesity   . PONV (postoperative nausea and vomiting)    with all  surgeries   . Secondary hyperparathyroidism of renal origin (CMS/HHS-HCC)   . Sleep apnea   . Tubular adenoma of colon 01/27/2016  . Type 2 diabetes mellitus (CMS/HHS-HCC)     Past Surgical History She has a past surgical history that includes Coronary Artery Bypass W/Venous & Arterial Grafts (07/17/2012); incision & drainage abscess chest (N/A, 08/17/2012); application wound vac (N/A, 08/17/2012); repair complex retinal detachment (Right, 03/25/2015); Colonoscopy (01/27/2016); extraction cataract extracapsular w/insertion intraocular prosthesis (Right, 04/18/2017); extraction cataract extracapsular w/insertion intraocular prosthesis (Left, 06/06/2017); Coronary artery bypass graft (06/2012); Cataract extraction (2018); creation distal arteriovenous fistula during lower extremity bypass surgery; other surgery (01/2021); a/v fistulagram (04/21/2021); and COLON@[facility]  (07/30/2024).   Medications and Allergies  Current Medications  Current Outpatient Medications on File Prior to Visit  Medication Sig Dispense Refill  .  acetaminophen  (TYLENOL  8 HOUR ORAL) Take by mouth as needed    . allopurinoL  (ZYLOPRIM ) 300 MG tablet TAKE 1 TABLET BY MOUTH DAILY 100 tablet 2  . amLODIPine  (NORVASC ) 5 MG tablet     . aspirin  81 MG EC tablet Take 81 mg by mouth daily.    . blood glucose diagnostic (ACCU-CHEK GUIDE TEST STRIPS) test strip 1 each (1 strip total) 4 (four) times daily Use as instructed. 400 each 3  . blood-glucose meter (ACCU-CHEK GUIDE ME GLUCOSE MTR) Misc 1 each as directed 1 each 0  . clopidogreL  (PLAVIX ) 75 mg tablet TAKE 1 TABLET BY MOUTH ONCE  DAILY 100 tablet 2  . FUROsemide  (LASIX ) 40 MG tablet Take 25 mg by mouth once daily as needed (tuesday, thursday, saturday and sunday (non dialysis days))    . hydrALAZINE  (APRESOLINE ) 50 MG tablet Take 50 mg by mouth 2 (two) times daily    . insulin  LISPRO (HUMALOG  KWIKPEN INSULIN ) pen injector (concentration 100 units/mL) INJECT SUBCUTANEOUSLY UP TO 120  UNITS DAILY IN DIVIDED DOSES, AS PRESCRIBED 120 mL 1  . lancets Use 1 each 3 (three) times daily Use as instructed. 200 each 5  . losartan  (COZAAR ) 100 MG tablet Take 100 mg by mouth once daily    . meclizine  (ANTIVERT ) 25 mg tablet Take 25 mg by mouth once daily as needed for Dizziness    . metoprolol  tartrate (LOPRESSOR ) 50 MG tablet TAKE 1 TABLET BY MOUTH TWICE  DAILY 200 tablet 2  . RENA-VITE 0.8 mg once daily    . rosuvastatin  (CRESTOR ) 40 MG tablet TAKE 1 TABLET BY MOUTH ONCE  DAILY 100 tablet 2  . sevelamer  carbonate (RENVELA ) 800 mg tablet Take 800 mg by mouth 3 (three) times daily    . TRESIBA  FLEXTOUCH U-200 pen injector (concentration 200 units/mL) INJECT 74 UNITS SUBCUTANEOUSLY  ONCE DAILY 45 mL 2   No current facility-administered medications on file prior to visit.    Allergies: Dulaglutide  Social and Family History  Social History  reports that she has never smoked. She has never used smokeless tobacco. She reports that she does not drink alcohol  and does not use drugs.  Family History Family History  Problem Relation Name Age of Onset  . Diabetes Mother Dominic DelCorso   . Breast cancer Mother Dominic DelCorso   . Multiple sclerosis Mother Dominic DelCorso   . Coronary Artery Disease (Blocked arteries around heart) Mother Dominic DelCorso   . Prostate cancer Mother Dominic DelCorso   . Diabetes Brother    . Heart disease Brother    . Diabetes Maternal Grandmother    . Diabetes Maternal Grandfather    . Myocardial Infarction (Heart attack) Father Dominic DelCorso   . High blood pressure (Hypertension) Father Dominic DelCorso   . Bone cancer Father Dominic DelCorso   . Cancer Father Dominic DelCorso        lung  . Glaucoma Neg Hx    . Macular degeneration Neg Hx    . Anesthesia problems Neg Hx    . Malignant hyperthermia Neg Hx      Review of Systems   Review of Systems  Constitutional:        Denies weight gain  Respiratory:  Negative for shortness of  breath.   Cardiovascular:  Negative for chest pain, palpitations, orthopnea and leg swelling.  Gastrointestinal:  Negative for heartburn.  Neurological:  Positive for loss of consciousness. Negative for dizziness.  Endo/Heme/Allergies:  Does not bruise/bleed easily.  Physical Examination   Vitals:BP (!) 150/70 (BP Location: Right upper arm, Patient Position: Sitting, BP Cuff Size: Adult)   Pulse 60   Ht 170.2 cm (5' 7)   Wt (!) 109.9 kg (242 lb 3.2 oz)   SpO2 95%   BMI 37.93 kg/m  Ht:170.2 cm (5' 7) Wt:(!) 109.9 kg (242 lb 3.2 oz) ADJ:Anib surface area is 2.28 meters squared. Body mass index is 37.93 kg/m.  Physical Exam Vitals reviewed.  Constitutional:      General: She is not in acute distress.    Appearance: Normal appearance.  Cardiovascular:     Rate and Rhythm: Normal rate and regular rhythm.     Heart sounds: Normal heart sounds. No murmur heard. Pulmonary:     Effort: Pulmonary effort is normal. No respiratory distress.     Breath sounds: Normal breath sounds.  Musculoskeletal:     Right lower leg: 1+ Edema present.     Left lower leg: 1+ Edema present.  Neurological:     Mental Status: She is alert.       Data & Results   Recent Labs    11/09/22 0918 05/24/23 0956 01/24/24 1347  CHOLTOTAL 142 156 110  HDL 37.5 34.9* 41.4  LDLCALC 70 83 53  VLDL 35 38 16  TRIG 174 189 80    Recent Labs    10/06/21 1028 05/05/22 0846 05/24/23 0956 01/24/24 1347 04/26/24 1516  NA 141   < > 143 142 144  K 3.8   < > 4.1 4.3 4.6  BUN 26*   < > 38* 40* 52*  CREATININE 2.8*   < > 3.9* 4.0* 4.8*  CO2 30.3   < > 29.8 30.3 32.3*  GLUCOSE 162*   < > 172* 190* 177*  GLUF 162*  --   --   --   --   ALT 30   < > 22 16 17   AST 24   < > 19 12 15   TBILI 0.5   < > 0.5 0.4 0.4  ALB 4.2   < > 4.3 4.2 4.5   < > = values in this interval not displayed.    Recent Labs    01/24/24 1347 02/28/24 1339 04/26/24 1516  WBC 9.0 9.5 10.1  HGB 9.9* 10.0* 11.4*  HCT  30.8* 30.2* 34.6*  MCV 96.6 93.5 93.0  PLT 235 256 228    Recent Labs    11/09/22 0918 05/24/23 0956 01/24/24 1347 04/26/24 1516  TSH 6.373* 4.752 4.144 3.968  HGBA1C 9.1*  --  8.2* 8.4*        Assessment   63 y.o. female with  Encounter Diagnoses  Name Primary?  . Coronary artery disease involving native coronary artery of native heart without angina pectoris Yes  . Primary hypertension   . Myocardial infarction, unspecified MI type, unspecified artery (CMS/HHS-HCC)   . Mixed hyperlipidemia   . Type 2 diabetes mellitus with chronic kidney disease on chronic dialysis, with long-term current use of insulin  (CMS/HHS-HCC)   . Essential hypertension, benign   . Class 3 severe obesity due to excess calories with serious comorbidity and body mass index (BMI) of 40.0 to 44.9 in adult   . CKD (chronic kidney disease), stage IV (CMS/HHS-HCC)   . Anemia in chronic kidney disease, on chronic dialysis (CMS/HHS-HCC)   . OSA on CPAP   . History of stroke   . Heart failure with mildly reduced ejection fraction (HFmrEF) (CMS/HHS-HCC)   . Syncope, unspecified  syncope type     Plan   Orders Placed This Encounter  Procedures  . CARD holter monitoring > 7 days to 15 days - hook up  . ECG 12-lead  . Echo complete   - 14-day Holter ordered last visit with recent syncope and recurrent CVA on DAPT 11/2023. Stroke determined to be ischemic by Neurology. Will have our staff try to track down current Holter. If unable to find, will place 14-day Holter at [facility] appt on 9/9. - Repeat 2D ECHO with recent reduced EF prior to admission for acute HF episode. Evaluate function and need for GDMT optimization. Continue current medications, no changes today.  - Continue following with Neurology and ENT  - ED with recurrent syncope - Continue current medications. No changes today. Euvolemic on exam today.  - Last LDL 53, at goal. Continue rosuvastatin . - Continue CPAP.  Return in about 4 weeks (around  09/11/2024).   Attestation Statement:   I personally performed the service, non-incident to. [facility])   Sharon TINNIE LAUNIE MAIDEN, NP

## 2024-09-10 ENCOUNTER — Emergency Department

## 2024-09-10 ENCOUNTER — Inpatient Hospital Stay
Admission: EM | Admit: 2024-09-10 | Discharge: 2024-09-16 | DRG: 252 | Disposition: A | Discharge status: Home / Self Care | Attending: Hospitalist | Admitting: Hospitalist

## 2024-09-10 DIAGNOSIS — D631 Anemia in chronic kidney disease: Secondary | ICD-10-CM | POA: Diagnosis present

## 2024-09-10 DIAGNOSIS — L97919 Non-pressure chronic ulcer of unspecified part of right lower leg with unspecified severity: Secondary | ICD-10-CM | POA: Diagnosis not present

## 2024-09-10 DIAGNOSIS — N2581 Secondary hyperparathyroidism of renal origin: Secondary | ICD-10-CM | POA: Diagnosis present

## 2024-09-10 DIAGNOSIS — M5431 Sciatica, right side: Secondary | ICD-10-CM | POA: Diagnosis present

## 2024-09-10 DIAGNOSIS — Z800 Family history of malignant neoplasm of digestive organs: Secondary | ICD-10-CM

## 2024-09-10 DIAGNOSIS — H548 Legal blindness, as defined in USA: Secondary | ICD-10-CM | POA: Diagnosis present

## 2024-09-10 DIAGNOSIS — Y835 Amputation of limb(s) as the cause of abnormal reaction of the patient, or of later complication, without mention of misadventure at the time of the procedure: Secondary | ICD-10-CM | POA: Diagnosis not present

## 2024-09-10 DIAGNOSIS — E669 Obesity, unspecified: Secondary | ICD-10-CM | POA: Diagnosis present

## 2024-09-10 DIAGNOSIS — B9561 Methicillin susceptible Staphylococcus aureus infection as the cause of diseases classified elsewhere: Secondary | ICD-10-CM | POA: Diagnosis present

## 2024-09-10 DIAGNOSIS — E1122 Type 2 diabetes mellitus with diabetic chronic kidney disease: Secondary | ICD-10-CM | POA: Diagnosis present

## 2024-09-10 DIAGNOSIS — I700 Atherosclerosis of aorta: Secondary | ICD-10-CM | POA: Diagnosis present

## 2024-09-10 DIAGNOSIS — M869 Osteomyelitis, unspecified: Secondary | ICD-10-CM | POA: Diagnosis not present

## 2024-09-10 DIAGNOSIS — Z1152 Encounter for screening for COVID-19: Secondary | ICD-10-CM

## 2024-09-10 DIAGNOSIS — L97514 Non-pressure chronic ulcer of other part of right foot with necrosis of bone: Secondary | ICD-10-CM | POA: Diagnosis present

## 2024-09-10 DIAGNOSIS — Z604 Social exclusion and rejection: Secondary | ICD-10-CM | POA: Diagnosis present

## 2024-09-10 DIAGNOSIS — Z992 Dependence on renal dialysis: Secondary | ICD-10-CM | POA: Diagnosis not present

## 2024-09-10 DIAGNOSIS — L03031 Cellulitis of right toe: Secondary | ICD-10-CM | POA: Diagnosis present

## 2024-09-10 DIAGNOSIS — L03115 Cellulitis of right lower limb: Secondary | ICD-10-CM | POA: Diagnosis present

## 2024-09-10 DIAGNOSIS — E785 Hyperlipidemia, unspecified: Secondary | ICD-10-CM | POA: Diagnosis present

## 2024-09-10 DIAGNOSIS — I132 Hypertensive heart and chronic kidney disease with heart failure and with stage 5 chronic kidney disease, or end stage renal disease: Secondary | ICD-10-CM | POA: Diagnosis present

## 2024-09-10 DIAGNOSIS — A480 Gas gangrene: Secondary | ICD-10-CM | POA: Diagnosis present

## 2024-09-10 DIAGNOSIS — K30 Functional dyspepsia: Secondary | ICD-10-CM | POA: Diagnosis not present

## 2024-09-10 DIAGNOSIS — I5032 Chronic diastolic (congestive) heart failure: Secondary | ICD-10-CM | POA: Diagnosis present

## 2024-09-10 DIAGNOSIS — N186 End stage renal disease: Secondary | ICD-10-CM | POA: Diagnosis present

## 2024-09-10 DIAGNOSIS — I70239 Atherosclerosis of native arteries of right leg with ulceration of unspecified site: Secondary | ICD-10-CM | POA: Diagnosis not present

## 2024-09-10 DIAGNOSIS — Z7984 Long term (current) use of oral hypoglycemic drugs: Secondary | ICD-10-CM

## 2024-09-10 DIAGNOSIS — Z808 Family history of malignant neoplasm of other organs or systems: Secondary | ICD-10-CM

## 2024-09-10 DIAGNOSIS — Z860100 Personal history of colon polyps, unspecified: Secondary | ICD-10-CM

## 2024-09-10 DIAGNOSIS — E1169 Type 2 diabetes mellitus with other specified complication: Secondary | ICD-10-CM | POA: Diagnosis present

## 2024-09-10 DIAGNOSIS — Z5982 Transportation insecurity: Secondary | ICD-10-CM

## 2024-09-10 DIAGNOSIS — Z794 Long term (current) use of insulin: Secondary | ICD-10-CM | POA: Diagnosis not present

## 2024-09-10 DIAGNOSIS — T8182XA Emphysema (subcutaneous) resulting from a procedure, initial encounter: Secondary | ICD-10-CM | POA: Diagnosis not present

## 2024-09-10 DIAGNOSIS — Z888 Allergy status to other drugs, medicaments and biological substances status: Secondary | ICD-10-CM

## 2024-09-10 DIAGNOSIS — R0902 Hypoxemia: Secondary | ICD-10-CM | POA: Diagnosis present

## 2024-09-10 DIAGNOSIS — Z8673 Personal history of transient ischemic attack (TIA), and cerebral infarction without residual deficits: Secondary | ICD-10-CM

## 2024-09-10 DIAGNOSIS — Y92239 Unspecified place in hospital as the place of occurrence of the external cause: Secondary | ICD-10-CM | POA: Diagnosis not present

## 2024-09-10 DIAGNOSIS — M86171 Other acute osteomyelitis, right ankle and foot: Secondary | ICD-10-CM | POA: Diagnosis present

## 2024-09-10 DIAGNOSIS — Z7902 Long term (current) use of antithrombotics/antiplatelets: Secondary | ICD-10-CM

## 2024-09-10 DIAGNOSIS — Z8619 Personal history of other infectious and parasitic diseases: Secondary | ICD-10-CM

## 2024-09-10 DIAGNOSIS — Z7982 Long term (current) use of aspirin: Secondary | ICD-10-CM

## 2024-09-10 DIAGNOSIS — Z803 Family history of malignant neoplasm of breast: Secondary | ICD-10-CM

## 2024-09-10 DIAGNOSIS — M51369 Other intervertebral disc degeneration, lumbar region without mention of lumbar back pain or lower extremity pain: Secondary | ICD-10-CM | POA: Diagnosis present

## 2024-09-10 DIAGNOSIS — E11319 Type 2 diabetes mellitus with unspecified diabetic retinopathy without macular edema: Secondary | ICD-10-CM | POA: Diagnosis present

## 2024-09-10 DIAGNOSIS — I2510 Atherosclerotic heart disease of native coronary artery without angina pectoris: Secondary | ICD-10-CM | POA: Diagnosis present

## 2024-09-10 DIAGNOSIS — Z6839 Body mass index (BMI) 39.0-39.9, adult: Secondary | ICD-10-CM

## 2024-09-10 DIAGNOSIS — E1152 Type 2 diabetes mellitus with diabetic peripheral angiopathy with gangrene: Secondary | ICD-10-CM | POA: Diagnosis present

## 2024-09-10 DIAGNOSIS — S90424A Blister (nonthermal), right lesser toe(s), initial encounter: Secondary | ICD-10-CM | POA: Diagnosis present

## 2024-09-10 DIAGNOSIS — E11621 Type 2 diabetes mellitus with foot ulcer: Secondary | ICD-10-CM | POA: Diagnosis not present

## 2024-09-10 DIAGNOSIS — R197 Diarrhea, unspecified: Secondary | ICD-10-CM | POA: Diagnosis not present

## 2024-09-10 DIAGNOSIS — Z7901 Long term (current) use of anticoagulants: Secondary | ICD-10-CM

## 2024-09-10 DIAGNOSIS — K0889 Other specified disorders of teeth and supporting structures: Secondary | ICD-10-CM | POA: Diagnosis present

## 2024-09-10 DIAGNOSIS — Z79899 Other long term (current) drug therapy: Secondary | ICD-10-CM

## 2024-09-10 DIAGNOSIS — Z8701 Personal history of pneumonia (recurrent): Secondary | ICD-10-CM

## 2024-09-10 DIAGNOSIS — Z951 Presence of aortocoronary bypass graft: Secondary | ICD-10-CM

## 2024-09-10 DIAGNOSIS — E1140 Type 2 diabetes mellitus with diabetic neuropathy, unspecified: Secondary | ICD-10-CM | POA: Diagnosis present

## 2024-09-10 DIAGNOSIS — G4733 Obstructive sleep apnea (adult) (pediatric): Secondary | ICD-10-CM | POA: Diagnosis present

## 2024-09-10 DIAGNOSIS — I252 Old myocardial infarction: Secondary | ICD-10-CM

## 2024-09-10 DIAGNOSIS — Z8042 Family history of malignant neoplasm of prostate: Secondary | ICD-10-CM

## 2024-09-10 DIAGNOSIS — Z9889 Other specified postprocedural states: Secondary | ICD-10-CM

## 2024-09-10 DIAGNOSIS — I70261 Atherosclerosis of native arteries of extremities with gangrene, right leg: Secondary | ICD-10-CM | POA: Diagnosis present

## 2024-09-10 DIAGNOSIS — D72829 Elevated white blood cell count, unspecified: Secondary | ICD-10-CM | POA: Diagnosis present

## 2024-09-10 DIAGNOSIS — E876 Hypokalemia: Secondary | ICD-10-CM | POA: Diagnosis not present

## 2024-09-10 DIAGNOSIS — Z884 Allergy status to anesthetic agent status: Secondary | ICD-10-CM

## 2024-09-10 DIAGNOSIS — T3695XA Adverse effect of unspecified systemic antibiotic, initial encounter: Secondary | ICD-10-CM | POA: Diagnosis not present

## 2024-09-10 LAB — CBC WITH DIFFERENTIAL/PLATELET
Abs Immature Granulocytes: 0.09 K/uL — ABNORMAL HIGH (ref 0.00–0.07)
Basophils Absolute: 0.1 K/uL (ref 0.0–0.1)
Basophils Relative: 0 %
Eosinophils Absolute: 0.1 K/uL (ref 0.0–0.5)
Eosinophils Relative: 0 %
HCT: 30.5 % — ABNORMAL LOW (ref 36.0–46.0)
Hemoglobin: 10.1 g/dL — ABNORMAL LOW (ref 12.0–15.0)
Immature Granulocytes: 1 %
Lymphocytes Relative: 11 %
Lymphs Abs: 1.8 K/uL (ref 0.7–4.0)
MCH: 30.1 pg (ref 26.0–34.0)
MCHC: 33.1 g/dL (ref 30.0–36.0)
MCV: 91 fL (ref 80.0–100.0)
Monocytes Absolute: 1.3 K/uL — ABNORMAL HIGH (ref 0.1–1.0)
Monocytes Relative: 8 %
Neutro Abs: 13.2 K/uL — ABNORMAL HIGH (ref 1.7–7.7)
Neutrophils Relative %: 80 %
Platelets: 321 K/uL (ref 150–400)
RBC: 3.35 MIL/uL — ABNORMAL LOW (ref 3.87–5.11)
RDW: 14 % (ref 11.5–15.5)
WBC: 16.6 K/uL — ABNORMAL HIGH (ref 4.0–10.5)
nRBC: 0 % (ref 0.0–0.2)

## 2024-09-10 LAB — URINALYSIS, W/ REFLEX TO CULTURE (INFECTION SUSPECTED)
Bilirubin Urine: NEGATIVE
Glucose, UA: >=500 mg/dL — AB
Hgb urine dipstick: NEGATIVE
Ketones, ur: NEGATIVE mg/dL
Leukocytes,Ua: NEGATIVE
Nitrite: NEGATIVE
Protein, ur: >=300 mg/dL — AB
Specific Gravity, Urine: 1.013 (ref 1.005–1.030)
pH: 8 (ref 5.0–8.0)

## 2024-09-10 LAB — COMPREHENSIVE METABOLIC PANEL WITH GFR
ALT: 31 U/L (ref 0–44)
AST: 30 U/L (ref 15–41)
Albumin: 3.4 g/dL — ABNORMAL LOW (ref 3.5–5.0)
Alkaline Phosphatase: 47 U/L (ref 38–126)
Anion gap: 14 (ref 5–15)
BUN: 27 mg/dL — ABNORMAL HIGH (ref 8–23)
CO2: 26 mmol/L (ref 22–32)
Calcium: 8.7 mg/dL — ABNORMAL LOW (ref 8.9–10.3)
Chloride: 96 mmol/L — ABNORMAL LOW (ref 98–111)
Creatinine, Ser: 3.48 mg/dL — ABNORMAL HIGH (ref 0.44–1.00)
GFR, Estimated: 14 mL/min — ABNORMAL LOW (ref >60)
Glucose, Bld: 123 mg/dL — ABNORMAL HIGH (ref 70–99)
Potassium: 2.9 mmol/L — ABNORMAL LOW (ref 3.5–5.1)
Sodium: 136 mmol/L (ref 135–145)
Total Bilirubin: 0.9 mg/dL (ref 0.0–1.2)
Total Protein: 7.8 g/dL (ref 6.5–8.1)

## 2024-09-10 LAB — CBG MONITORING, ED
Glucose-Capillary: 123 mg/dL — ABNORMAL HIGH (ref 70–99)
Glucose-Capillary: 205 mg/dL — ABNORMAL HIGH (ref 70–99)

## 2024-09-10 LAB — RESP PANEL BY RT-PCR (RSV, FLU A&B, COVID)  RVPGX2
Influenza A by PCR: NEGATIVE
Influenza B by PCR: NEGATIVE
Resp Syncytial Virus by PCR: NEGATIVE
SARS Coronavirus 2 by RT PCR: NEGATIVE

## 2024-09-10 LAB — LACTIC ACID, PLASMA: Lactic Acid, Venous: 1.1 mmol/L (ref 0.5–1.9)

## 2024-09-10 LAB — PROTIME-INR
INR: 1.2 (ref 0.8–1.2)
Prothrombin Time: 16.2 s — ABNORMAL HIGH (ref 11.4–15.2)

## 2024-09-10 MED ORDER — ROSUVASTATIN CALCIUM 20 MG PO TABS
40.0000 mg | ORAL_TABLET | Freq: Every day | ORAL | Status: DC
Start: 2024-09-11 — End: 2024-09-11
  Filled 2024-09-10: qty 2

## 2024-09-10 MED ORDER — HYDRALAZINE HCL 20 MG/ML IJ SOLN: 10.0000 mg | Freq: Four times a day (QID) | INTRAMUSCULAR | Status: DC | PRN

## 2024-09-10 MED ORDER — LACTATED RINGERS IV SOLN: INTRAVENOUS | Status: DC

## 2024-09-10 MED ORDER — LOSARTAN POTASSIUM 50 MG PO TABS
100.0000 mg | ORAL_TABLET | Freq: Every day | ORAL | Status: DC
  Administered 2024-09-11 – 2024-09-15 (×6): 100 mg via ORAL
  Filled 2024-09-10 (×6): qty 2

## 2024-09-10 MED ORDER — INSULIN ASPART 100 UNIT/ML IJ SOLN
0.0000 [IU] | Freq: Three times a day (TID) | INTRAMUSCULAR | Status: DC
  Administered 2024-09-11 – 2024-09-12 (×2): 1 [IU] via SUBCUTANEOUS
  Administered 2024-09-13: 2 [IU] via SUBCUTANEOUS
  Administered 2024-09-14 – 2024-09-16 (×5): 1 [IU] via SUBCUTANEOUS
  Filled 2024-09-10 (×8): qty 1

## 2024-09-10 MED ORDER — METOPROLOL TARTRATE 50 MG PO TABS
50.0000 mg | ORAL_TABLET | Freq: Two times a day (BID) | ORAL | Status: DC
  Administered 2024-09-10 – 2024-09-16 (×9): 50 mg via ORAL
  Filled 2024-09-10 (×10): qty 1

## 2024-09-10 MED ORDER — VANCOMYCIN HCL IN DEXTROSE 1-5 GM/200ML-% IV SOLN
1000.0000 mg | Freq: Once | INTRAVENOUS | Status: AC
  Administered 2024-09-10: 1000 mg via INTRAVENOUS
  Filled 2024-09-10: qty 200

## 2024-09-10 MED ORDER — HYDRALAZINE HCL 50 MG PO TABS: 50.0000 mg | ORAL_TABLET | Freq: Two times a day (BID) | ORAL | Status: DC | PRN

## 2024-09-10 MED ORDER — ONDANSETRON HCL 4 MG/2ML IJ SOLN
4.0000 mg | Freq: Four times a day (QID) | INTRAMUSCULAR | Status: DC | PRN
  Administered 2024-09-11: 4 mg via INTRAVENOUS
  Filled 2024-09-10: qty 2

## 2024-09-10 MED ORDER — ONDANSETRON HCL 4 MG PO TABS: 4.0000 mg | ORAL_TABLET | Freq: Four times a day (QID) | ORAL | Status: DC | PRN

## 2024-09-10 MED ORDER — INSULIN GLARGINE 100 UNIT/ML ~~LOC~~ SOLN
74.0000 [IU] | Freq: Every day | SUBCUTANEOUS | Status: DC
  Administered 2024-09-10 – 2024-09-11 (×2): 74 [IU] via SUBCUTANEOUS
  Filled 2024-09-10 (×3): qty 0.74

## 2024-09-10 MED ORDER — RENA-VITE PO TABS
1.0000 | ORAL_TABLET | Freq: Every day | ORAL | Status: DC
  Administered 2024-09-10 – 2024-09-15 (×6): 1 via ORAL
  Filled 2024-09-10 (×6): qty 1

## 2024-09-10 MED ORDER — ACETAMINOPHEN 325 MG PO TABS
650.0000 mg | ORAL_TABLET | ORAL | Status: DC | PRN
Start: 2024-09-10 — End: 2024-09-12
  Administered 2024-09-11 – 2024-09-12 (×3): 650 mg via ORAL
  Filled 2024-09-10 (×2): qty 2

## 2024-09-10 MED ORDER — VANCOMYCIN HCL IN DEXTROSE 1-5 GM/200ML-% IV SOLN
1000.0000 mg | INTRAVENOUS | Status: DC
Start: 2024-09-10 — End: 2024-09-11

## 2024-09-10 MED ORDER — CLOPIDOGREL BISULFATE 75 MG PO TABS
75.0000 mg | ORAL_TABLET | Freq: Every day | ORAL | Status: DC
  Administered 2024-09-11 – 2024-09-12 (×2): 75 mg via ORAL
  Filled 2024-09-10 (×2): qty 1

## 2024-09-10 MED ORDER — SENNOSIDES-DOCUSATE SODIUM 8.6-50 MG PO TABS: 1.0000 | ORAL_TABLET | Freq: Every evening | ORAL | Status: DC | PRN

## 2024-09-10 MED ORDER — SEVELAMER CARBONATE 800 MG PO TABS
800.0000 mg | ORAL_TABLET | Freq: Three times a day (TID) | ORAL | Status: DC
  Administered 2024-09-11 – 2024-09-16 (×10): 800 mg via ORAL
  Filled 2024-09-10 (×18): qty 1

## 2024-09-10 MED ORDER — ALLOPURINOL 300 MG PO TABS
300.0000 mg | ORAL_TABLET | Freq: Every morning | ORAL | Status: DC
  Filled 2024-09-10: qty 1

## 2024-09-10 MED ORDER — SODIUM CHLORIDE 0.9 % IV BOLUS (SEPSIS)
1000.0000 mL | Freq: Once | INTRAVENOUS | Status: AC
  Administered 2024-09-10: 1000 mL via INTRAVENOUS

## 2024-09-10 MED ORDER — HEPARIN SODIUM (PORCINE) 5000 UNIT/ML IJ SOLN
5000.0000 [IU] | Freq: Three times a day (TID) | INTRAMUSCULAR | Status: DC
  Administered 2024-09-10 – 2024-09-16 (×16): 5000 [IU] via SUBCUTANEOUS
  Filled 2024-09-10 (×18): qty 1

## 2024-09-10 MED ORDER — SODIUM CHLORIDE 0.9 % IV SOLN
2.0000 g | Freq: Once | INTRAVENOUS | Status: AC
  Administered 2024-09-10: 2 g via INTRAVENOUS
  Filled 2024-09-10: qty 12.5

## 2024-09-10 MED ORDER — ASPIRIN 81 MG PO TBEC
81.0000 mg | DELAYED_RELEASE_TABLET | Freq: Every morning | ORAL | Status: DC
Start: 2024-09-11 — End: 2024-09-11

## 2024-09-10 MED ORDER — AMLODIPINE BESYLATE 5 MG PO TABS
5.0000 mg | ORAL_TABLET | Freq: Every day | ORAL | Status: DC
  Administered 2024-09-10 – 2024-09-15 (×6): 5 mg via ORAL
  Filled 2024-09-10 (×7): qty 1

## 2024-09-10 MED ORDER — METRONIDAZOLE 500 MG/100ML IV SOLN
500.0000 mg | Freq: Once | INTRAVENOUS | Status: AC
  Administered 2024-09-10: 500 mg via INTRAVENOUS
  Filled 2024-09-10: qty 100

## 2024-09-10 NOTE — Consult Note (Signed)
 CODE SEPSIS - PHARMACY COMMUNICATION  **Broad Spectrum Antibiotics should be administered within 1 hour of Sepsis diagnosis**  Time Code Sepsis Called/Page Received: 1852  Antibiotics Ordered: vancomycin , cefepime  and metronidazole   Time of 1st antibiotic administration: 1930 Cefepime   Additional action taken by pharmacy: none  If necessary, Name of Provider/Nurse Contacted: n/a   Kynisha Memon Rodriguez-Guzman PharmD, BCPS 09/10/2024 7:27 PM

## 2024-09-10 NOTE — ED Notes (Signed)
 Pt placed into gown and belongings placed in bag. Pt given graham crackers upon request.

## 2024-09-10 NOTE — ED Triage Notes (Signed)
 PT arrives via POV. PT arrives with multiple complaints. PT reports sob that started today. She was initially 86% on RA. Pt was placed on 2LNC, o2 is now 96%. Pt denies chest pain.  She reports pain in her right leg from her knee down to her ankle since last Monday. Has been taking colchicine without relief.  PT is also a diabetic, she noticed a blister on her 2nd right toe this past Friday. She states the wound/blister has worsened. Currently dressed, there is a strong odor coming from the toe, redness is noted to the top of her foot as well.  She does dialysis MWF. She states she made it through a complete session today.

## 2024-09-10 NOTE — H&P (Signed)
 History and Physical    Patient: Sharon Arias DOB: 07-06-1961 DOA: 09/10/2024 DOS: the patient was seen and examined on 09/10/2024 PCP: Auston Reyes BIRCH, MD  Patient coming from: Home  Chief Complaint:  Chief Complaint  Patient presents with   Wound Infection   Shortness of Breath   HPI: Sharon Arias is a 63 y.o. female with medical history significant of non-insulin -dependent diabetes mellitus, chronic diabetic neuropathy, end-stage renal disease on hemodialysis, diabetic retinopathy with clinical blindness, diastolic congestive heart failure, coronary disease status post CABG, obesity, obstructive sleep apnea on CPAP.  She presents to the emergency room this evening on account of right foot pain.  As per patient, 3 days ago she noted swelling and blistering on her second toe on foot.  Due to her poor visual impairment and diabetic neuropathy, she did not notice any worsening symptoms until she noted swelling and discoloration of the toe.  Due to these abnormal findings, she decided to come to the emergency room to be further evaluated.  She denied any associated fever or chills.  Denies any shortness of breath or chest pain at this time.  She denies any prior history of peripheral vascular disease but was noted on EMR to be on dual antiplatelet therapy with aspirin  and Plavix .   Review of Systems: As mentioned in the history of present illness. All other systems reviewed and are negative. Past Medical History:  Diagnosis Date   Anemia in chronic kidney disease 05/19/2016   Anxiety    Aortic atherosclerosis    Blind    CAD (coronary artery disease)    Cardiac murmur    CHF (congestive heart failure) (HCC)    G2DD on 02/2020 TTE   Chicken pox    Chronic anticoagulation    CVA (cerebral vascular accident) (HCC)    DDD (degenerative disc disease), lumbar    Detached retina    Diabetic retinopathy (HCC)    legally blind   Diastolic congestive heart failure (HCC)     Dyspnea    ESRD needing dialysis (HCC)    Hx of CABG    Hyperlipidemia    Hypertension    NSTEMI (non-ST elevated myocardial infarction) (HCC) 06/2012   Obesity    Pneumonia    PONV (postoperative nausea and vomiting)    Sciatica of right side    Secondary hyperparathyroidism of renal origin    Sleep apnea    CPAP NIGHTLY   Stage 5 chronic kidney disease (HCC)    T2DM (type 2 diabetes mellitus) (HCC)    Past Surgical History:  Procedure Laterality Date   A/V FISTULAGRAM Left 04/21/2021   Procedure: A/V FISTULAGRAM;  Surgeon: Jama Cordella MATSU, MD;  Location: [facility];  Service: Cardiovascular;  Laterality: Left;   A/V FISTULAGRAM Left 11/02/2022   Procedure: A/V Fistulagram;  Surgeon: Jama Cordella MATSU, MD;  Location: [facility];  Service: Cardiovascular;  Laterality: Left;   AV FISTULA PLACEMENT Left 02/06/2021   Procedure: ARTERIOVENOUS (AV) FISTULA CREATION (BRACHIAL CEPHALIC );  Surgeon: Jama Cordella MATSU, MD;  Location: [facility] ORS;  Service: Vascular;  Laterality: Left;   COLONOSCOPY N/A 06/14/2024   Procedure: COLONOSCOPY;  Surgeon: Onita Elspeth Sharper, DO;  Location[facility] ENDOSCOPY;  Service: Gastroenterology;  Laterality: N/A;  IDDM  Patient on Plavix    COLONOSCOPY N/A 07/30/2024   Procedure: COLONOSCOPY;  Surgeon: Onita Elspeth Sharper, DO;  Location: [facility] ENDOSCOPY;  Service: Gastroenterology;  Laterality: N/A;  1st  AM case per Onita  COLONOSCOPY WITH PROPOFOL  N/A 01/26/2016   Procedure: COLONOSCOPY WITH PROPOFOL ;  Surgeon: Gladis RAYMOND Mariner, MD;  Location: [facility] ENDOSCOPY;  Service: Endoscopy;  Laterality: N/A;   COLONOSCOPY WITH PROPOFOL  N/A 01/27/2016   Procedure: COLONOSCOPY WITH PROPOFOL ;  Surgeon: Gladis RAYMOND Mariner, MD;  Location: [facility] ENDOSCOPY;  Service: Endoscopy;  Laterality: N/A;   CORONARY ARTERY BYPASS GRAFT  2013   DIALYSIS/PERMA CATHETER INSERTION N/A 02/03/2021   Procedure: DIALYSIS/PERMA CATHETER INSERTION;  Surgeon: Jama Cordella MATSU,  MD;  Location: [facility];  Service: Cardiovascular;  Laterality: N/A;   DIALYSIS/PERMA CATHETER INSERTION N/A 06/16/2021   Procedure: DIALYSIS/PERMA CATHETER INSERTION;  Surgeon: Marea Selinda RAMAN, MD;  Location: [facility];  Service: Cardiovascular;  Laterality: N/A;   DIALYSIS/PERMA CATHETER REMOVAL N/A 09/22/2021   Procedure: DIALYSIS/PERMA CATHETER REMOVAL;  Surgeon: Jama Cordella MATSU, MD;  Location: [facility];  Service: Cardiovascular;  Laterality: N/A;   INCISION AND DRAINAGE     chest abscess   INCISION AND DRAINAGE ABSCESS N/A 12/11/2018   Procedure: INCISION AND DRAINAGE PERINEAL;  Surgeon: Jordis Laneta FALCON, MD;  Location: [facility] ORS;  Service: General;  Laterality: N/A;   POLYPECTOMY  07/30/2024   Procedure: POLYPECTOMY, INTESTINE;  Surgeon: Onita Elspeth Sharper, DO;  Location: [facility] ENDOSCOPY;  Service: Gastroenterology;;   RETINAL LASER PROCEDURE     Social History:  reports that she has never smoked. She has never been exposed to tobacco smoke. She has never used smokeless tobacco. She reports that she does not drink alcohol  and does not use drugs.  Allergies  Allergen Reactions   Ozempic (0.25 Or 0.5 Mg-Dose) [Semaglutide(0.25 Or 0.5mg -Dos)] Diarrhea and Nausea Only   Trulicity [Dulaglutide] Diarrhea and Nausea Only   Other     Anesthesia--nausea/vomiting    Family History  Problem Relation Age of Onset   Breast cancer Mother 18   Prostate cancer Father        we think mets to liver and bone    Prior to Admission medications   Medication Sig Start Date End Date Taking? Authorizing Provider  acetaminophen  (TYLENOL ) 325 MG tablet Take 2 tablets (650 mg total) by mouth every 4 (four) hours as needed for mild pain (pain score 1-3) (or temp > 37.5 C (99.5 F)). 12/12/23   Angiulli, Toribio PARAS, PA-C  allopurinol  (ZYLOPRIM ) 300 MG tablet Take 1 tablet (300 mg total) by mouth in the morning. 12/15/23   Angiulli, Toribio PARAS, PA-C  amLODipine  (NORVASC ) 5 MG  tablet Take 1 tablet (5 mg total) by mouth at bedtime. 12/15/23   Angiulli, Toribio PARAS, PA-C  aspirin  EC 81 MG tablet Take 81 mg by mouth in the morning.    [provider]  clopidogrel  (PLAVIX ) 75 MG tablet Take 1 tablet (75 mg total) by mouth daily. 12/15/23   Angiulli, Toribio PARAS, PA-C  furosemide  (LASIX ) 40 MG tablet Take 1 tablet (40 mg total) by mouth daily as needed for edema. 12/15/23   Angiulli, Toribio PARAS, PA-C  hydrALAZINE  (APRESOLINE ) 50 MG tablet Take 1 tablet (50 mg total) by mouth in the morning and at bedtime. 12/15/23   Angiulli, Toribio PARAS, PA-C  insulin  lispro (HUMALOG ) 100 UNIT/ML KwikPen Inject 20 Units into the skin 3 (three) times daily with meals. 12/15/23   Angiulli, Toribio PARAS, PA-C  losartan  (COZAAR ) 100 MG tablet Take 1 tablet (100 mg total) by mouth at bedtime. 12/15/23   Angiulli, Toribio PARAS, PA-C  metoprolol  tartrate (LOPRESSOR ) 50 MG tablet Take 1 tablet (50  mg total) by mouth 2 (two) times daily. 12/15/23   Angiulli, Daniel J, PA-C  multivitamin (RENA-VIT) TABS tablet Take 1 tablet by mouth at bedtime. 02/18/21   [provider]  rosuvastatin  (CRESTOR ) 40 MG tablet Take 40 mg by mouth.  Take 40 mg by mouth. 06/03/20   [provider]  sevelamer  carbonate (RENVELA ) 800 MG tablet Take 1 tablet (800 mg total) by mouth 3 (three) times daily. 12/15/23   Angiulli, Toribio PARAS, PA-C  TRESIBA  FLEXTOUCH 200 UNIT/ML FlexTouch Pen INJECT 74 UNITS SUBCUTANEOUSLY ONCE DAILY 06/14/24   [provider]    Physical Exam: Vitals:   09/10/24 1915 09/10/24 1930 09/10/24 1945 09/10/24 2024  BP:    (!) 180/47  Pulse: 69 68 69 69  Resp:  11 14 18   Temp:    99.6 F (37.6 C)  TempSrc:    Oral  SpO2: 98% 99% 98% 96%  Weight:      Height:       Obese pleasant Caucasian female.  Seen laying comfortably in bed not in any acute distress.  She looks well-hydrated not pale or jaundiced HEENT: Oral mucosa moist.  Not pale or icteric Neck: Supple no JVD Chest:  Clinically diminished bilaterally due to body habitus.  No use of accessory muscles. Cardiovascular: Regular S1-S2 with no murmur. Abdomen: Soft nontender with no organomegaly CNS shows no focal deficits Skin negative for any new rash  Data Reviewed: Sodium was 136, potassium 2.9, chloride is 96, bicarb 26, glucose 123, BUN 27, creatinine 3.48, calcium  8.7, AST 30, ALT 31, total bilirubin 0.9 WBC 16.6, hemoglobin 10.1, hematocrit 30.5, platelet count 321, absolute neutrophils 13.2.  X-ray of the right foot shows second toe with bony erosions at the distal phalanx  Assessment and Plan: 63 year old female with multiple medical comorbidities that include end-stage renal disease chronic insulin -dependent diabetes mellitus complicated by diabetic retinopathy and diabetic renal disease.  Presents with diabetic foot ulcer with x-ray findings concerning for osteomyelitis.  1.Right diabetic foot ulcer involving second toe: Concerning for osteomyelitis -Send ESR and CRP -x-ray showed possible bone erosion in distal right toe concerning for Osteomyelitis - Patient was empirically initiated on IV antibiotics - Podiatry consulted to evaluate and advise - Duplex arterial ultrasound of right lower extremity will be ordered.  2.ESRD on hemodialysis - Continue hemodialysis per nephrology recommendation as above -HD maintenance per Nephrology. Last HD was earlier today   3.  Type 2 diabetes on insulin  therapy -Last A1c was 6.7 - Resume on home dose of 74 units of Tresiba . -Insulin  sliding scale per protocol. - Fingerstick glucose monitoring per unit protocol   4.  HTN: - Resume on home medications -Hold hydralazine  on days of dialysis   5.  CHF-HFpEF-Not inexacerbation - On Lasix  40 mg on dialysis off days  6. CAD s/p CABG, History of Stroke -Continue with Aspirin , Plavix  and statins for now   6.  Leukocytosis- Mild elevation from baseline -blood cultures, urinalysis ordered  7.   Hypokalemia: Asymptomatic at this time. - Considering end-stage renal disease,will hold off on repleting at this time -Repeat BMP pending for a.m   Advance Care Planning:   Code Status: Full Code   Consults: Podiatry and Nephrology consulted  Family Communication: None  Severity of Illness: The appropriate patient status for this patient is INPATIENT. Inpatient status is judged to be reasonable and necessary in order to provide the required intensity of service to ensure the patient's safety. The patient's presenting symptoms, physical exam  findings, and initial radiographic and laboratory data in the context of their chronic comorbidities is felt to place them at high risk for further clinical deterioration. Furthermore, it is not anticipated that the patient will be medically stable for discharge from the hospital within 2 midnights of admission.   * I certify that at the point of admission it is my clinical judgment that the patient will require inpatient hospital care spanning beyond 2 midnights from the point of admission due to high intensity of service, high risk for further deterioration and high frequency of surveillance required.*  Author: Maude MARLA Dart, MD 09/10/2024 11:15 PM  For on call review [email or web address].

## 2024-09-10 NOTE — ED Provider Notes (Signed)
 [facility] Provider Note    Event Date/Time   First MD Initiated Contact with Patient 09/10/24 [phone]     (approximate)  History   Chief Complaint: Wound Infection and Shortness of Breath  HPI  Sharon Arias is a 63 y.o. female with a past medical history of ESRD on HD Monday/Wednesday/Friday (including today), diabetic retinopathy, diabetes, neuropathy, CHF, presents to the emergency department for worsening pain in her right foot.  Patient states since Friday she has been experiencing pain in the right foot.  States she has noted some redness and blistering but states she is visually impaired and has not been able to evaluate very much.  Denies any known fever.  No nausea or vomiting.  Due to the worsening pain in the right foot patient came to the emergency department for evaluation.  Physical Exam   Triage Vital Signs: ED Triage Vitals  Encounter Vitals Group     BP 09/10/24 1745 (!) 171/57     Girls Systolic BP Percentile --      Girls Diastolic BP Percentile --      Boys Systolic BP Percentile --      Boys Diastolic BP Percentile --      Pulse Rate 09/10/24 1745 70     Resp 09/10/24 1745 19     Temp 09/10/24 1745 98.6 F (37 C)     Temp src --      SpO2 09/10/24 1745 (!) 86 %     Weight 09/10/24 1755 241 lb (109.3 kg)     Height 09/10/24 1755 5' 7 (1.702 m)     Head Circumference --      Peak Flow --      Pain Score 09/10/24 1755 10     Pain Loc --      Pain Education --      Exclude from Growth Chart --     Most recent vital signs: Vitals:   09/10/24 1745 09/10/24 1749  BP: (!) 171/57   Pulse: 70   Resp: 19   Temp: 98.6 F (37 C)   SpO2: (!) 86% 96%    General: Awake, no distress.  CV:  Good peripheral perfusion.  Regular rate and rhythm  Resp:  Normal effort.  Equal breath sounds bilaterally.  Abd:  No distention. Other:  Patient has significant redness swelling and drainage of the right second toe with erythema extending  proximally almost to the ankle and signs of lymphatic streaking.  Very foul-smelling discharge from the toe.   ED Results / Procedures / Treatments   EKG  EKG viewed and interpreted by myself shows what appears to be a sinus rhythm at 72 bpm with a narrow QRS, normal axis, normal intervals, nonspecific ST changes without ST elevation.  RADIOLOGY  I have reviewed the foot x-ray images.  I see swelling of the second digit possibly some distal bony erosion but no obvious gas. Radiology has read the x-ray as soft tissue defect with gas in the distal right second toe with underlying erosion of the distal phalanx.  Suggestive of osteomyelitis. Chest x-ray shows cardiomegaly with mild pulmonary vascular congestion.  Improved since last study.   MEDICATIONS ORDERED IN ED: Medications  lactated ringers  infusion (has no administration in time range)  sodium chloride  0.9 % bolus 1,000 mL (has no administration in time range)  ceFEPIme  (MAXIPIME ) 2 g in sodium chloride  0.9 % 100 mL IVPB (has no administration in time range)  metroNIDAZOLE  (FLAGYL )  IVPB 500 mg (has no administration in time range)  vancomycin  (VANCOCIN ) IVPB 1000 mg/200 mL premix (has no administration in time range)     IMPRESSION / MDM / ASSESSMENT AND PLAN / ED COURSE  I reviewed the triage vital signs and the nursing notes.  Patient's presentation is most consistent with acute presentation with potential threat to life or bodily function.  Patient presents to the emergency department for worsening pain in the right foot.  Patient has significant swelling erythema and discharge of the right second toe with surrounding erythema as well.  X-rays concerning for osteomyelitis.  Patient's lab work shows an elevated white blood cell count.  Initially reported hypoxia 86% although patient currently satting in the 90s on room air.  Patient meets sepsis criteria.  Lactic acid is reassuringly 1.1.  Chemistry shows no significant findings  given her dialysis status.  Given the x-ray concerning for osteomyelitis with the patient's leukocytosis and concerning physical exam for cellulitis in addition to the osteomyelitis we will admit to the hospital service for IV antibiotics.  Patient agreeable to plan of care and workup.  CRITICAL CARE Performed by: Franky Moores   Total critical care time: 30 minutes  Critical care time was exclusive of separately billable procedures and treating other patients.  Critical care was necessary to treat or prevent imminent or life-threatening deterioration.  Critical care was time spent personally by me on the following activities: development of treatment plan with patient and/or surrogate as well as nursing, discussions with consultants, evaluation of patient's response to treatment, examination of patient, obtaining history from patient or surrogate, ordering and performing treatments and interventions, ordering and review of laboratory studies, ordering and review of radiographic studies, pulse oximetry and re-evaluation of patient's condition.   FINAL CLINICAL IMPRESSION(S) / ED DIAGNOSES   Sepsis Osteomyelitis of the right second toe Cellulitis  Note:  This document was prepared using Dragon voice recognition software and may include unintentional dictation errors.   Moores Franky, MD 09/10/24 ALVIE

## 2024-09-10 NOTE — Sepsis Progress Note (Signed)
 Following for sepsis monitoring ?

## 2024-09-11 ENCOUNTER — Inpatient Hospital Stay

## 2024-09-11 ENCOUNTER — Other Ambulatory Visit: Payer: Self-pay

## 2024-09-11 DIAGNOSIS — E11621 Type 2 diabetes mellitus with foot ulcer: Secondary | ICD-10-CM | POA: Diagnosis not present

## 2024-09-11 DIAGNOSIS — L97514 Non-pressure chronic ulcer of other part of right foot with necrosis of bone: Secondary | ICD-10-CM | POA: Diagnosis not present

## 2024-09-11 LAB — CBC
HCT: 29.3 % — ABNORMAL LOW (ref 36.0–46.0)
Hemoglobin: 9.7 g/dL — ABNORMAL LOW (ref 12.0–15.0)
MCH: 30.4 pg (ref 26.0–34.0)
MCHC: 33.1 g/dL (ref 30.0–36.0)
MCV: 91.8 fL (ref 80.0–100.0)
Platelets: 307 K/uL (ref 150–400)
RBC: 3.19 MIL/uL — ABNORMAL LOW (ref 3.87–5.11)
RDW: 14.2 % (ref 11.5–15.5)
WBC: 18 K/uL — ABNORMAL HIGH (ref 4.0–10.5)
nRBC: 0 % (ref 0.0–0.2)

## 2024-09-11 LAB — BASIC METABOLIC PANEL WITH GFR
Anion gap: 12 (ref 5–15)
BUN: 31 mg/dL — ABNORMAL HIGH (ref 8–23)
CO2: 26 mmol/L (ref 22–32)
Calcium: 8.7 mg/dL — ABNORMAL LOW (ref 8.9–10.3)
Chloride: 98 mmol/L (ref 98–111)
Creatinine, Ser: 3.8 mg/dL — ABNORMAL HIGH (ref 0.44–1.00)
GFR, Estimated: 13 mL/min — ABNORMAL LOW (ref >60)
Glucose, Bld: 161 mg/dL — ABNORMAL HIGH (ref 70–99)
Potassium: 3.3 mmol/L — ABNORMAL LOW (ref 3.5–5.1)
Sodium: 136 mmol/L (ref 135–145)

## 2024-09-11 LAB — GLUCOSE, CAPILLARY
Glucose-Capillary: 126 mg/dL — ABNORMAL HIGH (ref 70–99)
Glucose-Capillary: 135 mg/dL — ABNORMAL HIGH (ref 70–99)
Glucose-Capillary: 169 mg/dL — ABNORMAL HIGH (ref 70–99)
Glucose-Capillary: 174 mg/dL — ABNORMAL HIGH (ref 70–99)

## 2024-09-11 LAB — C-REACTIVE PROTEIN: CRP: 15.1 mg/dL — ABNORMAL HIGH (ref <1.0)

## 2024-09-11 LAB — HEPATITIS B SURFACE ANTIGEN: Hepatitis B Surface Ag: NONREACTIVE

## 2024-09-11 LAB — SEDIMENTATION RATE: Sed Rate: 107 mm/h — ABNORMAL HIGH (ref 0–30)

## 2024-09-11 MED ORDER — LINEZOLID 600 MG/300ML IV SOLN
600.0000 mg | Freq: Two times a day (BID) | INTRAVENOUS | Status: DC
  Administered 2024-09-11 – 2024-09-15 (×7): 600 mg via INTRAVENOUS
  Filled 2024-09-11 (×10): qty 300

## 2024-09-11 MED ORDER — VANCOMYCIN HCL 1500 MG/300ML IV SOLN
1500.0000 mg | Freq: Once | INTRAVENOUS | Status: AC
  Administered 2024-09-11: 1500 mg via INTRAVENOUS
  Filled 2024-09-11 (×2): qty 300

## 2024-09-11 MED ORDER — VITAMIN C 500 MG PO TABS
500.0000 mg | ORAL_TABLET | Freq: Two times a day (BID) | ORAL | Status: DC
  Administered 2024-09-11 – 2024-09-16 (×10): 500 mg via ORAL
  Filled 2024-09-11 (×10): qty 1

## 2024-09-11 MED ORDER — ZINC SULFATE 220 (50 ZN) MG PO CAPS
220.0000 mg | ORAL_CAPSULE | Freq: Every day | ORAL | Status: DC
  Administered 2024-09-11 – 2024-09-16 (×6): 220 mg via ORAL
  Filled 2024-09-11 (×6): qty 1

## 2024-09-11 MED ORDER — ROSUVASTATIN CALCIUM 20 MG PO TABS
40.0000 mg | ORAL_TABLET | Freq: Every day | ORAL | Status: DC
  Administered 2024-09-11 – 2024-09-15 (×5): 40 mg via ORAL
  Filled 2024-09-11: qty 2
  Filled 2024-09-11: qty 4
  Filled 2024-09-11 (×3): qty 2

## 2024-09-11 MED ORDER — VANCOMYCIN HCL IN DEXTROSE 1-5 GM/200ML-% IV SOLN: 1000.0000 mg | INTRAVENOUS | Status: DC

## 2024-09-11 MED ORDER — NEPRO/CARBSTEADY PO LIQD
237.0000 mL | Freq: Three times a day (TID) | ORAL | Status: DC
  Administered 2024-09-11 – 2024-09-13 (×6): 237 mL via ORAL

## 2024-09-11 MED ORDER — SODIUM CHLORIDE 0.9 % IV SOLN
3.0000 g | Freq: Two times a day (BID) | INTRAVENOUS | Status: DC
  Administered 2024-09-11 – 2024-09-15 (×7): 3 g via INTRAVENOUS
  Filled 2024-09-11 (×10): qty 8

## 2024-09-11 MED ORDER — ALLOPURINOL 300 MG PO TABS
300.0000 mg | ORAL_TABLET | Freq: Every day | ORAL | Status: DC
  Administered 2024-09-11 – 2024-09-12 (×2): 300 mg via ORAL
  Filled 2024-09-11 (×2): qty 1

## 2024-09-11 MED ORDER — ASPIRIN 81 MG PO TBEC
81.0000 mg | DELAYED_RELEASE_TABLET | Freq: Every day | ORAL | Status: DC
  Administered 2024-09-11 – 2024-09-16 (×6): 81 mg via ORAL
  Filled 2024-09-11 (×6): qty 1

## 2024-09-11 MED ORDER — CHLORHEXIDINE GLUCONATE CLOTH 2 % EX PADS
6.0000 | MEDICATED_PAD | Freq: Every day | CUTANEOUS | Status: DC
  Administered 2024-09-12 – 2024-09-14 (×3): 6 via TOPICAL

## 2024-09-11 NOTE — Progress Notes (Signed)
 Pharmacy Antibiotic Note  Sharon Arias is a 63 y.o. female admitted on 09/10/2024 with wound infection.  Pharmacy has been consulted for Vancomycin  dosing.  Pt on HD every MWF,  last HD on 9/22.   Plan: Vancomycin  1 gm IV X 1 given in ED on 9/22 @ 2153. Additional Vanc 1500 mg IV X 1 ordered to make total loading dose of 2500 mg.   Vancomycin  1 gm IV Q MWF-HD ordered to start on 9/24.   Height: 5' 7 (170.2 cm) Weight: 109.3 kg (241 lb) IBW/kg (Calculated) : 61.6  Temp (24hrs), Avg:99.1 F (37.3 C), Min:98.6 F (37 C), Max:99.6 F (37.6 C)  Recent Labs  Lab 09/10/24 1749  WBC 16.6*  CREATININE 3.48*  LATICACIDVEN 1.1    Estimated Creatinine Clearance: 21.1 mL/min (A) (by C-G formula based on SCr of 3.48 mg/dL (H)).    Allergies  Allergen Reactions   Ozempic (0.25 Or 0.5 Mg-Dose) [Semaglutide(0.25 Or 0.5mg -Dos)] Diarrhea and Nausea Only   Trulicity [Dulaglutide] Diarrhea and Nausea Only   Other     Anesthesia--nausea/vomiting    Antimicrobials this admission:   >>    >>   Dose adjustments this admission:   Microbiology results:  BCx:   UCx:    Sputum:    MRSA PCR:   Thank you for allowing pharmacy to be a part of this patient's care.  Marili Vader D 09/11/2024 12:19 AM

## 2024-09-11 NOTE — Plan of Care (Signed)
  Problem: Education: Goal: Ability to describe self-care measures that may prevent or decrease complications (Diabetes Survival Skills Education) will improve Outcome: Progressing   Problem: Coping: Goal: Ability to adjust to condition or change in health will improve Outcome: Progressing   Problem: Fluid Volume: Goal: Ability to maintain a balanced intake and output will improve Outcome: Progressing   Problem: Nutritional: Goal: Maintenance of adequate nutrition will improve Outcome: Progressing   Problem: Skin Integrity: Goal: Risk for impaired skin integrity will decrease Outcome: Progressing

## 2024-09-11 NOTE — Progress Notes (Signed)
 PROGRESS NOTE    ERCEL NORMOYLE  FMW:[id number] DOB: July 22, 1961 DOA: 09/10/2024 PCP: Auston Reyes BIRCH, MD  Chief Complaint  Patient presents with   Wound Infection   Shortness of Breath    Hospital Course:  Sharon Arias 63 year old female with diabetes, chronic diabetic neuropathy, ESRD on HD, diabetic retinopathy with clinical blindness, diastolic congestive heart failure, CAD status post CABG, obesity, OSA on CPAP, who presents with right-sided foot pain.  Patient reports she had a small blister on the second toe of her foot that she noticed 3 days ago, she reports the swelling and blistering became worse so she presented to the ED.  She denies any fever or chills at home.  MRI of the foot confirms osteomyelitis, arterial Dopplers confirm significant PAD.  Podiatry and vascular surgery were consulted.  Subjective: Patient reports her pain is currently well-controlled.  She is surprised to see how bad her toe looks  Objective: Vitals:   09/11/24 0200 09/11/24 0433 09/11/24 0718 09/11/24 1000  BP:  (!) 146/43 (!) 144/69   Pulse:  65 69   Resp:  18 15   Temp: 100.2 F (37.9 C) 98.9 F (37.2 C) 98.2 F (36.8 C)   TempSrc:  Oral    SpO2:  95% (!) 82% 95%  Weight:      Height:        Intake/Output Summary (Last 24 hours) at 09/11/2024 1417 Last data filed at 09/11/2024 0900 Gross per 24 hour  Intake 882.58 ml  Output --  Net 882.58 ml   Filed Weights   09/10/24 1755 09/11/24 0048  Weight: 109.3 kg 113 kg    Examination: General exam: Appears calm and comfortable, NAD  Respiratory system: No work of breathing, symmetric chest wall expansion Cardiovascular system: S1 & S2 heard, RRR.  Gastrointestinal system: Abdomen is nondistended, soft and nontender.  Neuro: Alert and oriented. No focal neurological deficits. Extremities: Right second toe with black discoloration, white skin surrounding, cellulitis of skin up to midfoot.  Photos below Psychiatry: Demonstrates  appropriate judgement and insight. Mood & affect appropriate for situation.       Assessment & Plan:  Principal Problem:   Diabetic foot ulcer (HCC)    Osteomyelitis of right second toe Cellulitis PAD - Podiatry consulted, will likely require amputation - Duplex arterial ultrasounds show significant stenosis.  Vascular surgery consulted  ESRD on hemodialysis - Patient receives HD outpatient MWF - Nephrology consulted for HD while admitted  Type 2 diabetes on insulin  therapy - Most recent hemoglobin A1c 6.7 - Home dose 74 units of Tresiba  - Continue with sliding scale - basal/bolus, titrate as needed.  Hypertension - Continue home meds - As needed hydralazine  and labetalol  Heart failure with preserved EF, not currently in exacerbation - Clinically euvolemic at this time - Continue with Lasix  40 mg on dialysis off days  CAD status post CABG History of CVA - On aspirin , Plavix , statin.  Hypokalemia Anemia of CKD - Stable.  Monitor.  Replace as needed   DVT prophylaxis: Heparin    Code Status: Full Code Disposition:  Inpatient, pending vascular and podiatry interventions.  Consultants:  Treatment Team:  Consulting Physician: Malvin Marsa FALCON, DPM  Procedures:    Antimicrobials:  Anti-infectives (From admission, onward)    Start     Dose/Rate Route Frequency Ordered Stop   09/12/24 1200  vancomycin  (VANCOCIN ) IVPB 1000 mg/200 mL premix  Status:  Discontinued        1,000 mg 200 mL/hr over  60 Minutes Intravenous Every M-W-F (Hemodialysis) 09/11/24 0003 09/11/24 0923   09/11/24 2200  linezolid  (ZYVOX ) IVPB 600 mg       Placed in And Linked Group   600 mg 300 mL/hr over 60 Minutes Intravenous Every 12 hours 09/11/24 0917 09/18/24 2159   09/11/24 2200  Ampicillin -Sulbactam (UNASYN ) 3 g in sodium chloride  0.9 % 100 mL IVPB        3 g 200 mL/hr over 30 Minutes Intravenous Every 12 hours 09/11/24 0922     09/11/24 0030  vancomycin  (VANCOREADY) IVPB  1500 mg/300 mL        1,500 mg 150 mL/hr over 120 Minutes Intravenous  Once 09/11/24 0016 09/11/24 0353   09/10/24 2345  vancomycin  (VANCOCIN ) IVPB 1000 mg/200 mL premix  Status:  Discontinued        1,000 mg 200 mL/hr over 60 Minutes Intravenous Every 24 hours 09/10/24 2333 09/11/24 0001   09/10/24 1900  ceFEPIme  (MAXIPIME ) 2 g in sodium chloride  0.9 % 100 mL IVPB        2 g 200 mL/hr over 30 Minutes Intravenous  Once 09/10/24 1852 09/10/24 2024   09/10/24 1900  metroNIDAZOLE  (FLAGYL ) IVPB 500 mg        500 mg 100 mL/hr over 60 Minutes Intravenous  Once 09/10/24 1852 09/10/24 2135   09/10/24 1900  vancomycin  (VANCOCIN ) IVPB 1000 mg/200 mL premix        1,000 mg 200 mL/hr over 60 Minutes Intravenous  Once 09/10/24 1852 09/10/24 2257       Data Reviewed: I have personally reviewed following labs and imaging studies CBC: Recent Labs  Lab 09/10/24 1749 09/11/24 0138  WBC 16.6* 18.0*  NEUTROABS 13.2*  --   HGB 10.1* 9.7*  HCT 30.5* 29.3*  MCV 91.0 91.8  PLT 321 307   Basic Metabolic Panel: Recent Labs  Lab 09/10/24 1749 09/11/24 0138  NA 136 136  K 2.9* 3.3*  CL 96* 98  CO2 26 26  GLUCOSE 123* 161*  BUN 27* 31*  CREATININE 3.48* 3.80*  CALCIUM  8.7* 8.7*   GFR: Estimated Creatinine Clearance: 19.7 mL/min (A) (by C-G formula based on SCr of 3.8 mg/dL (H)). Liver Function Tests: Recent Labs  Lab 09/10/24 1749  AST 30  ALT 31  ALKPHOS 47  BILITOT 0.9  PROT 7.8  ALBUMIN 3.4*   CBG: Recent Labs  Lab 09/10/24 1758 09/10/24 2324 09/11/24 0840 09/11/24 1237  GLUCAP 123* 205* 126* 135*    Recent Results (from the past 240 hours)  Culture, blood (Routine x 2)     Status: None (Preliminary result)   Collection Time: 09/10/24  5:49 PM   Specimen: BLOOD  Result Value Ref Range Status   Specimen Description BLOOD BLOOD RIGHT HAND  Final   Special Requests   Final    BOTTLES DRAWN AEROBIC AND ANAEROBIC Blood Culture results may not be optimal due to an  inadequate volume of blood received in culture bottles   Culture   Final    NO GROWTH < 24 HOURS Performed at [facility], [address], [place], [place] [address]    Report Status PENDING  Incomplete  Resp panel by RT-PCR (RSV, Flu A&B, Covid) Anterior Nasal Swab     Status: None   Collection Time: 09/10/24  7:11 PM   Specimen: Anterior Nasal Swab  Result Value Ref Range Status   SARS Coronavirus 2 by RT PCR NEGATIVE NEGATIVE Final    Comment: (NOTE) SARS-CoV-2 target  nucleic acids are NOT DETECTED.  The SARS-CoV-2 RNA is generally detectable in upper respiratory specimens during the acute phase of infection. The lowest concentration of SARS-CoV-2 viral copies this assay can detect is 138 copies/mL. A negative result does not preclude SARS-Cov-2 infection and should not be used as the sole basis for treatment or other patient management decisions. A negative result may occur with  improper specimen collection/handling, submission of specimen other than nasopharyngeal swab, presence of viral mutation(s) within the areas targeted by this assay, and inadequate number of viral copies(<138 copies/mL). A negative result must be combined with clinical observations, patient history, and epidemiological information. The expected result is Negative.  Fact Sheet for Patients:  BloggerCourse.com  Fact Sheet for Healthcare Providers:  SeriousBroker.it  This test is no t yet approved or cleared by the [place]  FDA and  has been authorized for detection and/or diagnosis of SARS-CoV-2 by FDA under an Emergency Use Authorization (EUA). This EUA will remain  in effect (meaning this test can be used) for the duration of the COVID-19 declaration under Section 564(b)(1) of the Act, 21 U.S.C.section 360bbb-3(b)(1), unless the authorization is terminated  or revoked sooner.       Influenza A by PCR NEGATIVE NEGATIVE Final    Influenza B by PCR NEGATIVE NEGATIVE Final    Comment: (NOTE) The Xpert Xpress SARS-CoV-2/FLU/RSV plus assay is intended as an aid in the diagnosis of influenza from Nasopharyngeal swab specimens and should not be used as a sole basis for treatment. Nasal washings and aspirates are unacceptable for Xpert Xpress SARS-CoV-2/FLU/RSV testing.  Fact Sheet for Patients: BloggerCourse.com  Fact Sheet for Healthcare Providers: SeriousBroker.it  This test is not yet approved or cleared by the [place]  FDA and has been authorized for detection and/or diagnosis of SARS-CoV-2 by FDA under an Emergency Use Authorization (EUA). This EUA will remain in effect (meaning this test can be used) for the duration of the COVID-19 declaration under Section 564(b)(1) of the Act, 21 U.S.C. section 360bbb-3(b)(1), unless the authorization is terminated or revoked.     Resp Syncytial Virus by PCR NEGATIVE NEGATIVE Final    Comment: (NOTE) Fact Sheet for Patients: BloggerCourse.com  Fact Sheet for Healthcare Providers: SeriousBroker.it  This test is not yet approved or cleared by the [place]  FDA and has been authorized for detection and/or diagnosis of SARS-CoV-2 by FDA under an Emergency Use Authorization (EUA). This EUA will remain in effect (meaning this test can be used) for the duration of the COVID-19 declaration under Section 564(b)(1) of the Act, 21 U.S.C. section 360bbb-3(b)(1), unless the authorization is terminated or revoked.  Performed at [facility], [address] [place], [place] [address]   Culture, blood (Routine x 2)     Status: None (Preliminary result)   Collection Time: 09/10/24  7:12 PM   Specimen: BLOOD  Result Value Ref Range Status   Specimen Description BLOOD RIGHT ANTECUBITAL  Final   Special Requests   Final    BOTTLES DRAWN AEROBIC AND ANAEROBIC  Blood Culture results may not be optimal due to an inadequate volume of blood received in culture bottles   Culture   Final    NO GROWTH < 12 HOURS Performed at [facility], [address], [place], [place] [address]    Report Status PENDING  Incomplete     Radiology Studies: US  ARTERIAL LOWER EXTREMITY DUPLEX BILATERAL Result Date: 09/11/2024 CLINICAL DATA:  RIGHT lower extremity osteomyelitis. Diabetic foot ulcer. Hypertension. Hyperlipidemia. Diabetes.  Peripheral vascular disease. EXAM: BILATERAL LOWER EXTREMITY ARTERIAL DUPLEX SCAN TECHNIQUE: Gray-scale sonography as well as color Doppler and duplex ultrasound was performed to evaluate the arteries of both lower extremities including the common, superficial and profunda femoral arteries, popliteal artery and calf arteries. COMPARISON:  Ultrasound ABI 11/20/2019 FINDINGS: Right Lower Extremity Inflow: Normal common femoral arterial waveforms and velocities. Moderate amount of calcified atheromatous plaque seen in the RIGHT common femoral artery. Outflow: RIGHT profunda femoris artery is patent with triphasic waveform. Atheromatous plaque seen throughout the RIGHT superficial femoral artery. Waveform within the proximal RIGHT superficial femoral artery is triphasic. Waveform in the mid and distal RIGHT superficial femoral artery are monophasic. RIGHT popliteal artery waveform is monophasic. No focal elevation of peak systolic velocity. Runoff: Anterior and posterior tibial arteries are diffusely calcified with monophasic waveforms. Left Lower Extremity Inflow: Atherosclerotic plaque seen throughout the LEFT common femoral artery. LEFT common femoral artery waveform is triphasic. Outflow: Profunda femoris artery is patent with multiphasic waveform. Atheromatous plaque seen throughout the LEFT superficial femoral artery with monophasic waveforms seen throughout. Monophasic waveforms seen throughout the LEFT popliteal artery. No focal elevation  of peak systolic velocity identified. Runoff: LEFT anterior and posterior tibial arteries are diffusely calcified with monophasic waveforms. IMPRESSION: 1. Monophasic waveforms seen in the RIGHT mid to distal SFA, popliteal, anterior, and posterior tibial arteries indicative of stenosis at the junction of the proximal and mid 1/3 of the SFA. 2. Monophasic waveforms seen in the LEFT SFA, popliteal, anterior, and posterior tibial arteries indicative of stenosis at the level of the proximal SFA. Electronically Signed   By: Aliene Lloyd M.D.   On: 09/11/2024 12:29   MR FOOT RIGHT WO CONTRAST Result Date: 09/11/2024 CLINICAL DATA:  Foot pain with swelling, blistering on the second toe EXAM: MRI OF THE RIGHT FOREFOOT WITHOUT CONTRAST TECHNIQUE: Multiplanar, multisequence MR imaging of the right foot from the Lisfranc joint through the toes was performed. No intravenous contrast was administered. COMPARISON:  Radiographs 09/10/2024 FINDINGS: Bones/Joint/Cartilage Osteomyelitis of the distal phalanx second toe with eroded tuft and low-grade edema within and surrounding the remaining distal phalanx. Mild degenerative arthropathy at the midfoot and between the bases of the third and fourth metatarsals. Bifid medial first digit sesamoid. Ligaments The Lisfranc ligament appears grossly intact. Muscles and Tendons Regional muscular atrophy. Soft tissues Ulceration distally along the second toe. Cutaneous thickening in the second toe along with subcutaneous edema favoring cellulitis. Dorsal subcutaneous edema in the forefoot. No drainable abscess. IMPRESSION: 1. Osteomyelitis of the distal phalanx second toe with eroded tuft and low-grade edema within and surrounding the remaining distal phalanx. 2. Ulceration distally along the second toe with cellulitis. No drainable abscess. 3. Mild degenerative arthropathy at the midfoot and between the bases of the third and fourth metatarsals. 4. Regional muscular atrophy. Electronically  Signed   By: Ryan Salvage M.D.   On: 09/11/2024 11:34   DG Foot Complete Right Result Date: 09/10/2024 CLINICAL DATA:  Wound infection with possible osteomyelitis. Shortness of breath. EXAM: RIGHT FOOT COMPLETE - 3+ VIEW COMPARISON:  None Available. FINDINGS: Soft tissue defect and soft tissue gas in the distal right second toe with underlying tuft erosions suggesting osteomyelitis with skin infection. No radiopaque foreign bodies are identified. A degenerative changes demonstrated in the interphalangeal joints and intertarsal joints. Small calcaneal spurs. No evidence of acute fracture or dislocation. Vascular calcifications. IMPRESSION: Soft tissue defect and soft tissue gas in the distal right second toe with underlying erosion of the distal phalangeal  tuft suggesting osteomyelitis. Electronically Signed   By: Elsie Gravely M.D.   On: 09/10/2024 18:37   DG Chest 2 View if patient is not in a treatment room. Result Date: 09/10/2024 CLINICAL DATA:  Wound infection with possible osteomyelitis. Shortness of breath. EXAM: CHEST - 2 VIEW COMPARISON:  07/05/2024 FINDINGS: Postoperative changes in the mediastinum. Shallow inspiration. Cardiac enlargement with mild pulmonary vascular congestion and perihilar edema, improving since previous study. Minimal bilateral pleural effusions are also improved. No pneumothorax. Mediastinal contours appear intact. Calcification of the aorta. Degenerative changes in the spine and shoulders. IMPRESSION: Cardiac enlargement with mild pulmonary vascular congestion and mild perihilar edema demonstrating improvement since previous study. Minimal pleural effusions. Electronically Signed   By: Elsie Gravely M.D.   On: 09/10/2024 18:35    Scheduled Meds:  allopurinol   300 mg Oral Q breakfast   amLODipine   5 mg Oral QHS   vitamin C   500 mg Oral BID   aspirin  EC  81 mg Oral Q breakfast   clopidogrel   75 mg Oral Daily   feeding supplement (NEPRO CARB STEADY)  237 mL  Oral TID BM   heparin   5,000 Units Subcutaneous Q8H   insulin  aspart  0-6 Units Subcutaneous TID WC   insulin  glargine  74 Units Subcutaneous Q2200   losartan   100 mg Oral QHS   metoprolol  tartrate  50 mg Oral BID   multivitamin  1 tablet Oral QHS   rosuvastatin   40 mg Oral QHS   sevelamer  carbonate  800 mg Oral TID with meals   zinc  sulfate (50mg  elemental zinc )  220 mg Oral Daily   Continuous Infusions:  ampicillin -sulbactam (UNASYN ) IV     lactated ringers  Stopped (09/11/24 0100)   linezolid  (ZYVOX ) IV       LOS: 1 day  MDM: Patient is high risk for one or more organ failure.  They necessitate ongoing hospitalization for continued IV therapies and subsequent lab monitoring. Total time spent interpreting labs and vitals, reviewing the medical record, coordinating care amongst consultants and care team members, directly assessing and discussing care with the patient and/or family: 55 min  Arnold Kester, DO [facility]  To contact the attending physician between 7A-7P please use Epic Chat. To contact the covering physician during after hours 7P-7A, please review Amion.  09/11/2024, 2:17 PM   *This document has been created with the assistance of dictation software. Please excuse typographical errors. *

## 2024-09-11 NOTE — Consult Note (Signed)
 Hospital Consult    Reason for Consult:  Right Foot Second Toe Gangrene  Requesting Physician:  Dr Lorane Poland MD  MRN #:  [id number]  History of Present Illness: This is a 63 y.o. female with diabetes, chronic diabetic neuropathy, ESRD on HD, diabetic retinopathy with clinical blindness, diastolic congestive heart failure, CAD status post CABG, obesity, OSA on CPAP, who presents with right-sided foot pain. Patient reports she had a small blister on the second toe of her foot that she noticed 3 days ago, she reports the swelling and blistering became worse so she presented to the ED.   Upon work up patient underwent bilateral lower extremity arterial duplex ultrasounds.  Patient was noted to have monophasic waveforms bilaterally in her SFAs which is consistent with significant stenosis.  This correlates with her current symptoms of right foot second toe ulcer with gangrene.  Vascular surgery was consulted to evaluate.  Past Medical History:  Diagnosis Date   Anemia in chronic kidney disease 05/19/2016   Anxiety    Aortic atherosclerosis    Blind    CAD (coronary artery disease)    Cardiac murmur    CHF (congestive heart failure) (HCC)    G2DD on 02/2020 TTE   Chicken pox    Chronic anticoagulation    CVA (cerebral vascular accident) (HCC)    DDD (degenerative disc disease), lumbar    Detached retina    Diabetic retinopathy (HCC)    legally blind   Diastolic congestive heart failure (HCC)    Dyspnea    ESRD needing dialysis (HCC)    Hx of CABG    Hyperlipidemia    Hypertension    NSTEMI (non-ST elevated myocardial infarction) (HCC) 06/2012   Obesity    Pneumonia    PONV (postoperative nausea and vomiting)    Sciatica of right side    Secondary hyperparathyroidism of renal origin    Sleep apnea    CPAP NIGHTLY   Stage 5 chronic kidney disease (HCC)    T2DM (type 2 diabetes mellitus) (HCC)     Past Surgical History:  Procedure Laterality Date   A/V FISTULAGRAM Left  04/21/2021   Procedure: A/V FISTULAGRAM;  Surgeon: Jama Cordella MATSU, MD;  Location: [facility];  Service: Cardiovascular;  Laterality: Left;   A/V FISTULAGRAM Left 11/02/2022   Procedure: A/V Fistulagram;  Surgeon: Jama Cordella MATSU, MD;  Location: [facility];  Service: Cardiovascular;  Laterality: Left;   AV FISTULA PLACEMENT Left 02/06/2021   Procedure: ARTERIOVENOUS (AV) FISTULA CREATION (BRACHIAL CEPHALIC );  Surgeon: Jama Cordella MATSU, MD;  Location: [facility] ORS;  Service: Vascular;  Laterality: Left;   COLONOSCOPY N/A 06/14/2024   Procedure: COLONOSCOPY;  Surgeon: Onita Elspeth Sharper, DO;  Location: [facility] ENDOSCOPY;  Service: Gastroenterology;  Laterality: N/A;  IDDM  Patient on Plavix    COLONOSCOPY N/A 07/30/2024   Procedure: COLONOSCOPY;  Surgeon: Onita Elspeth Sharper, DO;  Location: [facility] ENDOSCOPY;  Service: Gastroenterology;  Laterality: N/A;  1st  AM case per Onita   COLONOSCOPY WITH PROPOFOL  N/A 01/26/2016   Procedure: COLONOSCOPY WITH PROPOFOL ;  Surgeon: Gladis RAYMOND Mariner, MD;  Location: [facility] ENDOSCOPY;  Service: Endoscopy;  Laterality: N/A;   COLONOSCOPY WITH PROPOFOL  N/A 01/27/2016   Procedure: COLONOSCOPY WITH PROPOFOL ;  Surgeon: Gladis RAYMOND Mariner, MD;  Location: [facility] ENDOSCOPY;  Service: Endoscopy;  Laterality: N/A;   CORONARY ARTERY BYPASS GRAFT  2013   DIALYSIS/PERMA CATHETER INSERTION N/A 02/03/2021   Procedure: DIALYSIS/PERMA CATHETER INSERTION;  Surgeon: Jama Cordella MATSU,  MD;  Location: [facility];  Service: Cardiovascular;  Laterality: N/A;   DIALYSIS/PERMA CATHETER INSERTION N/A 06/16/2021   Procedure: DIALYSIS/PERMA CATHETER INSERTION;  Surgeon: Marea Selinda RAMAN, MD;  Location: [facility];  Service: Cardiovascular;  Laterality: N/A;   DIALYSIS/PERMA CATHETER REMOVAL N/A 09/22/2021   Procedure: DIALYSIS/PERMA CATHETER REMOVAL;  Surgeon: Jama Cordella MATSU, MD;  Location: [facility];  Service: Cardiovascular;  Laterality: N/A;    INCISION AND DRAINAGE     chest abscess   INCISION AND DRAINAGE ABSCESS N/A 12/11/2018   Procedure: INCISION AND DRAINAGE PERINEAL;  Surgeon: Jordis Laneta FALCON, MD;  Location: [facility] ORS;  Service: General;  Laterality: N/A;   POLYPECTOMY  07/30/2024   Procedure: POLYPECTOMY, INTESTINE;  Surgeon: Onita Elspeth Sharper, DO;  Location: [facility] ENDOSCOPY;  Service: Gastroenterology;;   RETINAL LASER PROCEDURE      Allergies  Allergen Reactions   Ozempic (0.25 Or 0.5 Mg-Dose) [Semaglutide(0.25 Or 0.5mg -Dos)] Diarrhea and Nausea Only   Trulicity [Dulaglutide] Diarrhea and Nausea Only   Other     Anesthesia--nausea/vomiting    Prior to Admission medications   Medication Sig Start Date End Date Taking? Authorizing Provider  acetaminophen  (TYLENOL ) 325 MG tablet Take 2 tablets (650 mg total) by mouth every 4 (four) hours as needed for mild pain (pain score 1-3) (or temp > 37.5 C (99.5 F)). 12/12/23  Yes Angiulli, Toribio PARAS, PA-C  allopurinol  (ZYLOPRIM ) 300 MG tablet Take 1 tablet (300 mg total) by mouth in the morning. 12/15/23  Yes Angiulli, Toribio PARAS, PA-C  amLODipine  (NORVASC ) 5 MG tablet Take 1 tablet (5 mg total) by mouth at bedtime. 12/15/23  Yes Angiulli, Toribio PARAS, PA-C  aspirin  EC 81 MG tablet Take 81 mg by mouth in the morning.   Yes [provider]  clopidogrel  (PLAVIX ) 75 MG tablet Take 1 tablet (75 mg total) by mouth daily. 12/15/23  Yes Angiulli, Toribio PARAS, PA-C  furosemide  (LASIX ) 40 MG tablet Take 1 tablet (40 mg total) by mouth daily as needed for edema. 12/15/23  Yes Angiulli, Toribio PARAS, PA-C  hydrALAZINE  (APRESOLINE ) 50 MG tablet Take 1 tablet (50 mg total) by mouth in the morning and at bedtime. 12/15/23  Yes Angiulli, Toribio PARAS, PA-C  insulin  lispro (HUMALOG ) 100 UNIT/ML KwikPen Inject 20 Units into the skin 3 (three) times daily with meals. 12/15/23  Yes Angiulli, Toribio PARAS, PA-C  losartan  (COZAAR ) 100 MG tablet Take 1 tablet (100 mg total) by mouth at bedtime. 12/15/23  Yes  Angiulli, Toribio PARAS, PA-C  metoprolol  tartrate (LOPRESSOR ) 50 MG tablet Take 1 tablet (50 mg total) by mouth 2 (two) times daily. 12/15/23  Yes Angiulli, Daniel J, PA-C  multivitamin (RENA-VIT) TABS tablet Take 1 tablet by mouth at bedtime. 02/18/21  Yes [provider]  rosuvastatin  (CRESTOR ) 40 MG tablet Take 40 mg by mouth.  Take 40 mg by mouth. 06/03/20  Yes [provider]  sevelamer  carbonate (RENVELA ) 800 MG tablet Take 1 tablet (800 mg total) by mouth 3 (three) times daily. 12/15/23  Yes Angiulli, Toribio PARAS, PA-C  TRESIBA  FLEXTOUCH 200 UNIT/ML FlexTouch Pen INJECT 74 UNITS SUBCUTANEOUSLY ONCE DAILY 06/14/24  Yes [provider]    Social History   Socioeconomic History   Marital status: Married    Spouse name: Anaily Ashbaugh   Number of children: 3   Years of education: Not on file   Highest education level: Not on file  Occupational History   Not on file  Tobacco  Use   Smoking status: Never    Passive exposure: Never   Smokeless tobacco: Never  Vaping Use   Vaping status: Never Used  Substance and Sexual Activity   Alcohol  use: No   Drug use: No   Sexual activity: Not Currently  Other Topics Concern   Not on file  Social History Narrative   Lives at home with spouse    Social Drivers of Health   Financial Resource Strain: Low Risk  (08/06/2024)   Received from [facility]   Overall Financial Resource Strain (CARDIA)    Difficulty of Paying Living Expenses: Not very hard  Food Insecurity: No Food Insecurity (09/11/2024)   Hunger Vital Sign    Worried About Running Out of Food in the Last Year: Never true    Ran Out of Food in the Last Year: Never true  Transportation Needs: Unmet Transportation Needs (09/11/2024)   PRAPARE - Administrator, Civil Service (Medical): Yes    Lack of Transportation (Non-Medical): No  Physical Activity: Not on file  Stress: Not on file  Social Connections: Socially Isolated (09/11/2024)    Social Connection and Isolation Panel    Frequency of Communication with Friends and Family: More than three times a week    Frequency of Social Gatherings with Friends and Family: Once a week    Attends Religious Services: Never    Database administrator or Organizations: No    Attends Banker Meetings: Never    Marital Status: Widowed  Intimate Partner Violence: Not At Risk (09/11/2024)   Humiliation, Afraid, Rape, and Kick questionnaire    Fear of Current or Ex-Partner: No    Emotionally Abused: No    Physically Abused: No    Sexually Abused: No     Family History  Problem Relation Age of Onset   Breast cancer Mother 76   Prostate cancer Father        we think mets to liver and bone    ROS: Otherwise negative unless mentioned in HPI  Physical Examination  Vitals:   09/11/24 1000 09/11/24 1455  BP:  (!) 137/44  Pulse:  68  Resp:  15  Temp:  100 F (37.8 C)  SpO2: 95% 95%   Body mass index is 39.02 kg/m.  General:  WDWN in NAD Gait: Not observed HENT: WNL, normocephalic Pulmonary: normal non-labored breathing, without Rales, rhonchi,  wheezing Cardiac: regular, without  Murmurs, rubs or gallops; without carotid bruits Abdomen: Positive bowel sounds throughout, soft, NT/ND, no masses Skin: without rashes Vascular Exam/Pulses: Palpable Upper extremity pulses, unable to palpate bilateral lower extremity pulses. Pos +2 edema  Extremities: with ischemic changes, with Gangrene , without cellulitis; without open wounds;  Musculoskeletal: no muscle wasting or atrophy  Neurologic: A&O X 3;  No focal weakness or paresthesias are detected; speech is fluent/normal Psychiatric:  The pt has Normal affect. Lymph:  Unremarkable  CBC    Component Value Date/Time   WBC 18.0 (H) 09/11/2024 0138   RBC 3.19 (L) 09/11/2024 0138   HGB 9.7 (L) 09/11/2024 0138   HCT 29.3 (L) 09/11/2024 0138   HCT 33.3 (L) 11/05/2015 1443   PLT 307 09/11/2024 0138   MCV 91.8  09/11/2024 0138   MCH 30.4 09/11/2024 0138   MCHC 33.1 09/11/2024 0138   RDW 14.2 09/11/2024 0138   LYMPHSABS 1.8 09/10/2024 1749   MONOABS 1.3 (H) 09/10/2024 1749   EOSABS 0.1 09/10/2024 1749   BASOSABS 0.1  09/10/2024 1749    BMET    Component Value Date/Time   NA 136 09/11/2024 0138   K 3.3 (L) 09/11/2024 0138   CL 98 09/11/2024 0138   CO2 26 09/11/2024 0138   GLUCOSE 161 (H) 09/11/2024 0138   BUN 31 (H) 09/11/2024 0138   CREATININE 3.80 (H) 09/11/2024 0138   CALCIUM  8.7 (L) 09/11/2024 0138   GFRNONAA 13 (L) 09/11/2024 0138   GFRAA 19 (L) 09/12/2020 1331    COAGS: Lab Results  Component Value Date   INR 1.2 09/10/2024   INR 1.3 (H) 11/26/2023   INR 1.1 02/04/2021     Non-Invasive Vascular Imaging:   EXAM:09/11/24 BILATERAL LOWER EXTREMITY ARTERIAL DUPLEX SCAN   TECHNIQUE: Gray-scale sonography as well as color Doppler and duplex ultrasound was performed to evaluate the arteries of both lower extremities including the common, superficial and profunda femoral arteries, popliteal artery and calf arteries.   COMPARISON:  Ultrasound ABI 11/20/2019   FINDINGS: Right Lower Extremity   Inflow: Normal common femoral arterial waveforms and velocities. Moderate amount of calcified atheromatous plaque seen in the RIGHT common femoral artery.   Outflow: RIGHT profunda femoris artery is patent with triphasic waveform. Atheromatous plaque seen throughout the RIGHT superficial femoral artery. Waveform within the proximal RIGHT superficial femoral artery is triphasic. Waveform in the mid and distal RIGHT superficial femoral artery are monophasic. RIGHT popliteal artery waveform is monophasic. No focal elevation of peak systolic velocity.   Runoff: Anterior and posterior tibial arteries are diffusely calcified with monophasic waveforms.   Left Lower Extremity   Inflow: Atherosclerotic plaque seen throughout the LEFT common femoral artery. LEFT common femoral artery  waveform is triphasic.   Outflow: Profunda femoris artery is patent with multiphasic waveform. Atheromatous plaque seen throughout the LEFT superficial femoral artery with monophasic waveforms seen throughout. Monophasic waveforms seen throughout the LEFT popliteal artery. No focal elevation of peak systolic velocity identified.   Runoff: LEFT anterior and posterior tibial arteries are diffusely calcified with monophasic waveforms.   IMPRESSION: 1. Monophasic waveforms seen in the RIGHT mid to distal SFA, popliteal, anterior, and posterior tibial arteries indicative of stenosis at the junction of the proximal and mid 1/3 of the SFA. 2. Monophasic waveforms seen in the LEFT SFA, popliteal, anterior, and posterior tibial arteries indicative of stenosis at the level of the proximal SFA.  Statin:  Yes.   Beta Blocker:  Yes.   Aspirin :  Yes.   ACEI:  No. ARB:  Yes.   CCB use:  No Other antiplatelets/anticoagulants:  Yes.   Plavix  75 mg Daily   ASSESSMENT/PLAN: This is a 63 y.o. female who presents to [facility] emergency department with right sided foot pain.  She reported that she had a small blister on the second toe of her right foot 4 days ago which progressed into rapidly gangrene of her second toe with osteomyelitis.  She presented to the emergency department due to these changes.  Upon workup she was noted via ultrasound to have monophasic waveforms in both right and left SFA which is consistent and indicative of stenosis.  Therefore vascular surgery plans on taking the patient to the vascular lab on 09/13/2024 for right lower extremity angiogram with possible intervention.  I had a long detailed discussion at the bedside this afternoon with the patient regarding the procedure, benefits, risk, complications.  Patient verbalized her understanding and wishes to proceed.  I answered all the patient's questions this afternoon.  Patient will be made n.p.o. after midnight the  day of the  procedure.  The patient's home medications include aspirin  81 mg daily and Plavix  75 mg daily.  She is also currently getting heparin  5000 units subcu every 8 hours.  We will hold the heparin  injections prior to her procedure.  Patient is a Monday Wednesday Friday hemodialysis patient.  Her current BUN and creatinine are 49/5.12.  Patient is also noted to be chronically hypokalemic.  Patient will be supplemented prior to procedure.  Patient's latest hemoglobin/hematocrit is 8.1 and 24.5.  We will continue to monitor and trend.  Patient's last white blood cell count is 18.0 which is expected due to her osteomyelitis.  Patient's sedimentation rate is elevated at 107   -I discussed the case with Dr. Cordella Shawl MD and he agrees with the plan.   [name] [facility] 09/11/2024 4:45 PM

## 2024-09-12 DIAGNOSIS — M869 Osteomyelitis, unspecified: Secondary | ICD-10-CM | POA: Diagnosis not present

## 2024-09-12 DIAGNOSIS — M86171 Other acute osteomyelitis, right ankle and foot: Principal | ICD-10-CM

## 2024-09-12 DIAGNOSIS — E11621 Type 2 diabetes mellitus with foot ulcer: Secondary | ICD-10-CM | POA: Diagnosis not present

## 2024-09-12 DIAGNOSIS — L03115 Cellulitis of right lower limb: Secondary | ICD-10-CM

## 2024-09-12 DIAGNOSIS — L97514 Non-pressure chronic ulcer of other part of right foot with necrosis of bone: Secondary | ICD-10-CM | POA: Diagnosis not present

## 2024-09-12 LAB — COMPREHENSIVE METABOLIC PANEL WITH GFR
ALT: 24 U/L (ref 0–44)
AST: 17 U/L (ref 15–41)
Albumin: 2.7 g/dL — ABNORMAL LOW (ref 3.5–5.0)
Alkaline Phosphatase: 43 U/L (ref 38–126)
Anion gap: 14 (ref 5–15)
BUN: 49 mg/dL — ABNORMAL HIGH (ref 8–23)
CO2: 23 mmol/L (ref 22–32)
Calcium: 8.9 mg/dL (ref 8.9–10.3)
Chloride: 100 mmol/L (ref 98–111)
Creatinine, Ser: 5.12 mg/dL — ABNORMAL HIGH (ref 0.44–1.00)
GFR, Estimated: 9 mL/min — ABNORMAL LOW (ref >60)
Glucose, Bld: 147 mg/dL — ABNORMAL HIGH (ref 70–99)
Potassium: 3.3 mmol/L — ABNORMAL LOW (ref 3.5–5.1)
Sodium: 137 mmol/L (ref 135–145)
Total Bilirubin: 0.8 mg/dL (ref 0.0–1.2)
Total Protein: 6.4 g/dL — ABNORMAL LOW (ref 6.5–8.1)

## 2024-09-12 LAB — CBC WITH DIFFERENTIAL/PLATELET
Abs Immature Granulocytes: 0.09 K/uL — ABNORMAL HIGH (ref 0.00–0.07)
Basophils Absolute: 0.1 K/uL (ref 0.0–0.1)
Basophils Relative: 1 %
Eosinophils Absolute: 0.3 K/uL (ref 0.0–0.5)
Eosinophils Relative: 2 %
HCT: 24.5 % — ABNORMAL LOW (ref 36.0–46.0)
Hemoglobin: 8.1 g/dL — ABNORMAL LOW (ref 12.0–15.0)
Immature Granulocytes: 1 %
Lymphocytes Relative: 8 %
Lymphs Abs: 1.3 K/uL (ref 0.7–4.0)
MCH: 30.2 pg (ref 26.0–34.0)
MCHC: 33.1 g/dL (ref 30.0–36.0)
MCV: 91.4 fL (ref 80.0–100.0)
Monocytes Absolute: 1.5 K/uL — ABNORMAL HIGH (ref 0.1–1.0)
Monocytes Relative: 9 %
Neutro Abs: 13.3 K/uL — ABNORMAL HIGH (ref 1.7–7.7)
Neutrophils Relative %: 79 %
Platelets: 253 K/uL (ref 150–400)
RBC: 2.68 MIL/uL — ABNORMAL LOW (ref 3.87–5.11)
RDW: 14.2 % (ref 11.5–15.5)
WBC: 16.6 K/uL — ABNORMAL HIGH (ref 4.0–10.5)
nRBC: 0 % (ref 0.0–0.2)

## 2024-09-12 LAB — PHOSPHORUS: Phosphorus: 5 mg/dL — ABNORMAL HIGH (ref 2.5–4.6)

## 2024-09-12 LAB — GLUCOSE, CAPILLARY
Glucose-Capillary: 81 mg/dL (ref 70–99)
Glucose-Capillary: 170 mg/dL — ABNORMAL HIGH (ref 70–99)
Glucose-Capillary: 269 mg/dL — ABNORMAL HIGH (ref 70–99)

## 2024-09-12 LAB — MAGNESIUM: Magnesium: 2.1 mg/dL (ref 1.7–2.4)

## 2024-09-12 MED ORDER — INSULIN GLARGINE 100 UNIT/ML ~~LOC~~ SOLN
65.0000 [IU] | Freq: Every day | SUBCUTANEOUS | Status: DC
  Administered 2024-09-12 – 2024-09-13 (×2): 65 [IU] via SUBCUTANEOUS
  Filled 2024-09-12 (×2): qty 0.65

## 2024-09-12 MED ORDER — ACETAMINOPHEN 325 MG PO TABS
ORAL_TABLET | ORAL | Status: AC
  Filled 2024-09-12: qty 2

## 2024-09-12 MED ORDER — ACETAMINOPHEN 500 MG PO TABS
1000.0000 mg | ORAL_TABLET | Freq: Three times a day (TID) | ORAL | Status: DC | PRN
  Administered 2024-09-12 – 2024-09-15 (×5): 1000 mg via ORAL
  Filled 2024-09-12 (×5): qty 2

## 2024-09-12 NOTE — Progress Notes (Signed)
 Progress Note    09/12/2024 11:21 AM * No surgery found *  Subjective:  Sharon Arias is a 63 yo female with diabetes, chronic diabetic neuropathy, ESRD on HD, diabetic retinopathy with clinical blindness, diastolic congestive heart failure, CAD status post CABG, obesity, OSA on CPAP, who presents with right-sided foot pain. Patient reports she had a small blister on the second toe of her foot that she noticed 3 days ago, she reports the swelling and blistering became worse so she presented to the ED.   Patient is resting comfortably in hemodialysis this morning.  No complications noted.  Patient still endorses pain to her right foot this morning.  This is expected.  No complaints overnight vitals are stable.   Vitals:   09/12/24 1030 09/12/24 1100  BP: (!) 128/48 (!) 150/65  Pulse: 74 73  Resp: 19 16  Temp:    SpO2: 95% 97%   Physical Exam: Cardiac:  RRR, normal S1 and S2.  No murmurs. Lungs: Nonlabored breathing, clear on auscultation.  No rales rhonchi or wheezing. Incisions: None Extremities: Bilateral lower extremities warm to touch but unable to palpate pulses due to +2 edema.  Right lower extremity foot second great toe ulcers/wounds/gangrene.  Known osteomyelitis. Abdomen: Bowel sounds present throughout, soft, nontender nondistended. Neurologic: Alert and oriented x 3, answers questions follows commands.  CBC    Component Value Date/Time   WBC 16.6 (H) 09/12/2024 0257   RBC 2.68 (L) 09/12/2024 0257   HGB 8.1 (L) 09/12/2024 0257   HCT 24.5 (L) 09/12/2024 0257   HCT 33.3 (L) 11/05/2015 1443   PLT 253 09/12/2024 0257   MCV 91.4 09/12/2024 0257   MCH 30.2 09/12/2024 0257   MCHC 33.1 09/12/2024 0257   RDW 14.2 09/12/2024 0257   LYMPHSABS 1.3 09/12/2024 0257   MONOABS 1.5 (H) 09/12/2024 0257   EOSABS 0.3 09/12/2024 0257   BASOSABS 0.1 09/12/2024 0257    BMET    Component Value Date/Time   NA 137 09/12/2024 0257   K 3.3 (L) 09/12/2024 0257   CL 100 09/12/2024  0257   CO2 23 09/12/2024 0257   GLUCOSE 147 (H) 09/12/2024 0257   BUN 49 (H) 09/12/2024 0257   CREATININE 5.12 (H) 09/12/2024 0257   CALCIUM  8.9 09/12/2024 0257   GFRNONAA 9 (L) 09/12/2024 0257   GFRAA 19 (L) 09/12/2020 1331    INR    Component Value Date/Time   INR 1.2 09/10/2024 1749     Intake/Output Summary (Last 24 hours) at 09/12/2024 1121 Last data filed at 09/12/2024 9287 Gross per 24 hour  Intake 240 ml  Output 500 ml  Net -260 ml     Assessment/Plan:  63 y.o. female sees emergency room after 3 days of worsening pain to her right foot specifically second great toe.  Upon workup noted to have osteomyelitis with cellulitis and gangrene.* No surgery found *   PLAN Vascular surgery plans to take the patient to the vascular lab tomorrow on 09/13/2024 for right lower extremity angiogram with possible intervention.  Again today we discussed the procedure, benefits, risk, complications.  Patient verbalized understanding wishes to see.  Patient be made n.p.o. after midnight tonight for procedure tomorrow.  Patient is known hemodialysis patient last BUN is 49 and creatinine is 5.12.  Today's hemoglobin was 8.1 and hematocrit was 24.5.  We will continue to monitor.  Patient continues to be on aspirin  81 mg daily, Plavix  75 mg daily and heparin  5000 subcu every 8 hours.  Heparin   subcu will be held today and tomorrow prior to procedure.   DVT prophylaxis: Heparin  5000 units SQ every 8 hours.  I discussed the case in detail with Dr. Cordella Shawl MD and he agrees with the plan.   [name] [facility] 09/12/2024 11:21 AM

## 2024-09-12 NOTE — H&P (View-Only) (Signed)
 Progress Note    09/12/2024 11:21 AM * No surgery found *  Subjective:  Sharon Arias is a 63 yo female with diabetes, chronic diabetic neuropathy, ESRD on HD, diabetic retinopathy with clinical blindness, diastolic congestive heart failure, CAD status post CABG, obesity, OSA on CPAP, who presents with right-sided foot pain. Patient reports she had a small blister on the second toe of her foot that she noticed 3 days ago, she reports the swelling and blistering became worse so she presented to the ED.   Patient is resting comfortably in hemodialysis this morning.  No complications noted.  Patient still endorses pain to her right foot this morning.  This is expected.  No complaints overnight vitals are stable.   Vitals:   09/12/24 1030 09/12/24 1100  BP: (!) 128/48 (!) 150/65  Pulse: 74 73  Resp: 19 16  Temp:    SpO2: 95% 97%   Physical Exam: Cardiac:  RRR, normal S1 and S2.  No murmurs. Lungs: Nonlabored breathing, clear on auscultation.  No rales rhonchi or wheezing. Incisions: None Extremities: Bilateral lower extremities warm to touch but unable to palpate pulses due to +2 edema.  Right lower extremity foot second great toe ulcers/wounds/gangrene.  Known osteomyelitis. Abdomen: Bowel sounds present throughout, soft, nontender nondistended. Neurologic: Alert and oriented x 3, answers questions follows commands.  CBC    Component Value Date/Time   WBC 16.6 (H) 09/12/2024 0257   RBC 2.68 (L) 09/12/2024 0257   HGB 8.1 (L) 09/12/2024 0257   HCT 24.5 (L) 09/12/2024 0257   HCT 33.3 (L) 11/05/2015 1443   PLT 253 09/12/2024 0257   MCV 91.4 09/12/2024 0257   MCH 30.2 09/12/2024 0257   MCHC 33.1 09/12/2024 0257   RDW 14.2 09/12/2024 0257   LYMPHSABS 1.3 09/12/2024 0257   MONOABS 1.5 (H) 09/12/2024 0257   EOSABS 0.3 09/12/2024 0257   BASOSABS 0.1 09/12/2024 0257    BMET    Component Value Date/Time   NA 137 09/12/2024 0257   K 3.3 (L) 09/12/2024 0257   CL 100 09/12/2024  0257   CO2 23 09/12/2024 0257   GLUCOSE 147 (H) 09/12/2024 0257   BUN 49 (H) 09/12/2024 0257   CREATININE 5.12 (H) 09/12/2024 0257   CALCIUM  8.9 09/12/2024 0257   GFRNONAA 9 (L) 09/12/2024 0257   GFRAA 19 (L) 09/12/2020 1331    INR    Component Value Date/Time   INR 1.2 09/10/2024 1749     Intake/Output Summary (Last 24 hours) at 09/12/2024 1121 Last data filed at 09/12/2024 [id number] Gross per 24 hour  Intake 240 ml  Output 500 ml  Net -260 ml     Assessment/Plan:  63 y.o. female sees emergency room after 3 days of worsening pain to her right foot specifically second great toe.  Upon workup noted to have osteomyelitis with cellulitis and gangrene.* No surgery found *   PLAN Vascular surgery plans to take the patient to the vascular lab tomorrow on 09/13/2024 for right lower extremity angiogram with possible intervention.  Again today we discussed the procedure, benefits, risk, complications.  Patient verbalized understanding wishes to see.  Patient be made n.p.o. after midnight tonight for procedure tomorrow.  Patient is known hemodialysis patient last BUN is 49 and creatinine is 5.12.  Today's hemoglobin was 8.1 and hematocrit was 24.5.  We will continue to monitor.  Patient continues to be on aspirin  81 mg daily, Plavix  75 mg daily and heparin  5000 subcu every 8 hours.  Heparin   subcu will be held today and tomorrow prior to procedure.   DVT prophylaxis: Heparin  5000 units SQ every 8 hours.  I discussed the case in detail with Dr. Cordella Shawl MD and he agrees with the plan.   [name] [facility] 09/12/2024 11:21 AM

## 2024-09-12 NOTE — Consult Note (Signed)
 PODIATRY CONSULTATION  NAME Sharon Arias MRN [id number] DOB 17-Mar-1961 DOA 09/10/2024   Reason for consult:  Chief Complaint  Patient presents with   Wound Infection   Shortness of Breath    Attending/Consulting physician: IVAR Haber MD  History of present illness: Sharon Arias 63 year old female with diabetes, chronic diabetic neuropathy, ESRD on HD, diabetic retinopathy with clinical blindness, diastolic congestive heart failure, CAD status post CABG, obesity, OSA on CPAP, who presents with right-sided foot pain.  Patient reports she had a small blister on the second toe of her foot that she noticed 3 days ago, she reports the swelling and blistering became worse so she presented to the ED.  She denies any fever or chills at home.  MRI of the foot confirms osteomyelitis, arterial Dopplers confirm significant PAD.  Podiatry and vascular surgery were consulted.  Pt states the toe issue came on very quickly and escalated from a blister to what it looks like now. She does report neuropathy due to DM. She is aware of need for amputation and also aware of plan for blood flow procedure tomorrow. All questions were answered.   Past Medical History:  Diagnosis Date   Anemia in chronic kidney disease 05/19/2016   Anxiety    Aortic atherosclerosis    Blind    CAD (coronary artery disease)    Cardiac murmur    CHF (congestive heart failure) (HCC)    G2DD on 02/2020 TTE   Chicken pox    Chronic anticoagulation    CVA (cerebral vascular accident) (HCC)    DDD (degenerative disc disease), lumbar    Detached retina    Diabetic retinopathy (HCC)    legally blind   Diastolic congestive heart failure (HCC)    Dyspnea    ESRD needing dialysis (HCC)    Hx of CABG    Hyperlipidemia    Hypertension    NSTEMI (non-ST elevated myocardial infarction) (HCC) 06/2012   Obesity    Pneumonia    PONV (postoperative nausea and vomiting)    Sciatica of right side    Secondary hyperparathyroidism of  renal origin    Sleep apnea    CPAP NIGHTLY   Stage 5 chronic kidney disease (HCC)    T2DM (type 2 diabetes mellitus) (HCC)        Latest Ref Rng & Units 09/12/2024    2:57 AM 09/11/2024    1:38 AM 09/10/2024    5:49 PM  CBC  WBC 4.0 - 10.5 K/uL 16.6  18.0  16.6   Hemoglobin 12.0 - 15.0 g/dL 8.1  9.7  89.8   Hematocrit 36.0 - 46.0 % 24.5  29.3  30.5   Platelets 150 - 400 K/uL 253  307  321        Latest Ref Rng & Units 09/12/2024    2:57 AM 09/11/2024    1:38 AM 09/10/2024    5:49 PM  BMP  Glucose 70 - 99 mg/dL 852  838  876   BUN 8 - 23 mg/dL 49  31  27   Creatinine 0.44 - 1.00 mg/dL 4.87  6.19  6.51   Sodium 135 - 145 mmol/L 137  136  136   Potassium 3.5 - 5.1 mmol/L 3.3  3.3  2.9   Chloride 98 - 111 mmol/L 100  98  96   CO2 22 - 32 mmol/L 23  26  26    Calcium  8.9 - 10.3 mg/dL 8.9  8.7  8.7  Physical Exam: Lower Extremity Exam  R foot DP and PT pulses non palpable   Ulceration with necrotic tissue consistent with gas gangrene of the distal right 2nd toe, malodor present, macerated tissue with significant erythema and edema, no evidence of infection tracking past MPJ   Sensation significantly diminished to digit level  No prior amputations r foot       ASSESSMENT/PLAN OF CARE 63 y.o. female with PMHx significant for  diabetes, chronic diabetic neuropathy, ESRD on HD, diabetic retinopathy with clinical blindness, diastolic congestive heart failure, CAD status post CABG, obesity, OSA on CPAP  with gas gangrene of right 2nd toe with osteomyelitis of the distal phalanx.   WBC 16.6 CRP 15.1, ESR 107 A1c 6.7 07/05/24 MRI R foot: 1. Osteomyelitis of the distal phalanx second toe with eroded tuft and low-grade edema within and surrounding the remaining distal phalanx. 2. Ulceration distally along the second toe with cellulitis. No drainable abscess.   - NPO p MN Thursday night for OR Friday AM for R 2nd toe amputation. Pt in agreement with plan.  - Appreciate  vascular surgery, plan for angiogram RLE tomorrow to optimize arterial flow prior amputation - Continue IV abx broad spectrum pending further culture data - Anticoagulation: ok to continue per primary / vascular recs - Wound care: betadine pain to to pre op - WB status: WBAT pre op - Will continue to follow   Thank you for the consult.  Please contact me directly with any questions or concerns.           Marolyn JULIANNA Honour, DPM [facility]    [address] [place], [place] [address]                Office [phone]  Fax [phone]

## 2024-09-12 NOTE — Progress Notes (Signed)
  PROGRESS NOTE    Sharon Arias  FMW:[id number] DOB: [birth year] DOA: 09/10/2024 PCP: Sharon Reyes BIRCH, MD  132A/132A-AA  LOS: 2 days   Brief hospital course:   Assessment & Plan: Sharon Arias 63 year old female with diabetes, chronic diabetic neuropathy, ESRD on HD, diabetic retinopathy with clinical blindness, diastolic congestive heart failure, CAD status post CABG, obesity, OSA on CPAP, who presents with right-sided foot pain.  Patient reports she had a small blister on the second toe of her foot that she noticed 3 days ago, she reports the swelling and blistering became worse so she presented to the ED.  She denies any fever or chills at home.  MRI of the foot confirms osteomyelitis, arterial Dopplers confirm significant PAD.  Podiatry and vascular surgery were consulted.    Osteomyelitis of right second toe Cellulitis --cont Unasyn  and Linezolid  --toe amputation on Friday  PAD - Duplex arterial ultrasounds show significant stenosis.  Vascular surgery consulted --angiogram tomorrow   ESRD on hemodialysis MWF Hyperphos - iHD per nephro --cont Renvela    Type 2 diabetes on insulin  therapy - Most recent hemoglobin A1c 6.7 - Home dose 74 units of Tresiba  --reduce glargine to 65u nightly --ACHS and SSI   Hypertension --cont amlodipine , losartan  and lopressor    Heart failure with preserved EF, not currently in exacerbation - Clinically euvolemic at this time   CAD status post CABG History of CVA - cont aspirin , Plavix , statin.   Hypokalemia --supplement per nephro  Anemia of CKD - Stable.    DVT prophylaxis: Heparin  SQ Code Status: Full code  Family Communication:  Level of care: Med-Surg Dispo:   The patient is from: home Anticipated d/c is to: home Anticipated d/c date is: >3 days   Subjective and Interval History:  Pt reported pain in her toe, but only wanted tylenol  for now.   Objective: Vitals:   09/12/24 1130 09/12/24 1143 09/12/24 1241 09/12/24  1544  BP: 131/65 (!) 116/92 102/81 (!) 142/49  Pulse: 77 79 65 74  Resp: 16 17 17 17   Temp:  98.6 F (37 C) 98.6 F (37 C) 98.6 F (37 C)  TempSrc:  Oral Oral   SpO2: 99% 98% 95% 93%  Weight:      Height:        Intake/Output Summary (Last 24 hours) at 09/12/2024 1914 Last data filed at 09/12/2024 1535 Gross per 24 hour  Intake 448.69 ml  Output 3000 ml  Net -2551.31 ml   Filed Weights   09/10/24 1755 09/11/24 0048  Weight: 109.3 kg 113 kg    Examination:   Constitutional: NAD, AAOx3 HEENT: conjunctivae and lids normal, EOMI CV: No cyanosis.   RESP: normal respiratory effort, on RA Neuro: II - XII grossly intact.   Psych: Normal mood and affect.  Appropriate judgement and reason   Data Reviewed: I have personally reviewed labs and imaging studies  Time spent: 50 minutes  Sharon Haber, MD [facility] If 7PM-7AM, please contact night-coverage 09/12/2024, 7:14 PM

## 2024-09-12 NOTE — Progress Notes (Signed)
 [facility]  ROUNDING NOTE   Subjective:   Sharon Arias is a 63 year old female with past medical conditions including hypertension, diabetes, CAD status post CABG, CHF, stroke, and end-stage renal disease on hemodialysis.  Patient presents to the emergency department with shortness of breath, right lower leg pain, and second right toe wound.  Patient has been admitted for Diabetic foot ulcer (HCC) [E11.621, L97.509] Cellulitis of right lower extremity [L03.115] Acute osteomyelitis of toe, right [facility]) [M86.171]  Patient is known to our practice and receives outpatient dialysis treatments at [facility] on a MWF schedule, supervised by Dr. Douglas.  Last treatment received on Monday.  Patient states she has had this toe wound for a while.  Patient states she was started on antibiotics outpatient.  Pain has progressed.  Labs on ED arrival show potassium 2.9, creatinine 3.48 with GFR 14, white count 16.6, and hemoglobin 10.1.  Respiratory panel negative for influenza, COVID-19, and RSV.  Right foot imaging suggestive of osteomyelitis.  Chest x-ray shows mild pulmonary congestion with perihilar edema.  Vascular and podiatry have been consulted to evaluate second right toe.  We have been consulted to manage dialysis needs.   Objective:  Vital signs in last 24 hours:  Temp:  [97.7 F (36.5 C)-100 F (37.8 C)] 98.6 F (37 C) (09/24 1143) Pulse Rate:  [62-79] 79 (09/24 1143) Resp:  [14-22] 17 (09/24 1143) BP: (108-154)/(41-92) 116/92 (09/24 1143) SpO2:  [93 %-99 %] 98 % (09/24 1143) FiO2 (%):  [28 %] 28 % (09/23 2338)  Weight change:  Filed Weights   09/10/24 1755 09/11/24 0048  Weight: 109.3 kg 113 kg    Intake/Output: I/O last 3 completed shifts: In: 1122.6 [P.O.:240; I.V.:582.6; IV Piggyback:300] Out: -    Intake/Output this shift:  Total I/O In: -  Out: 3000 [Urine:500; Other:2500]  Physical Exam: General: NAD  Head: Normocephalic, atraumatic. Moist  oral mucosal membranes  Eyes: Anicteric  Lungs:  Clear to auscultation  Heart: Regular rate and rhythm  Abdomen:  Soft, nontender  Extremities: Trace peripheral edema.  Neurologic: Awake, alert, conversant  Skin: Warm,dry, right second  digit wound/necrotic  Access: Left AVF    Basic Metabolic Panel: Recent Labs  Lab 09/10/24 1749 09/11/24 0138 09/12/24 0257  NA 136 136 137  K 2.9* 3.3* 3.3*  CL 96* 98 100  CO2 26 26 23   GLUCOSE 123* 161* 147*  BUN 27* 31* 49*  CREATININE 3.48* 3.80* 5.12*  CALCIUM  8.7* 8.7* 8.9  MG  --   --  2.1  PHOS  --   --  5.0*    Liver Function Tests: Recent Labs  Lab 09/10/24 1749 09/12/24 0257  AST 30 17  ALT 31 24  ALKPHOS 47 43  BILITOT 0.9 0.8  PROT 7.8 6.4*  ALBUMIN 3.4* 2.7*   No results for input(s): LIPASE, AMYLASE in the last 168 hours. No results for input(s): AMMONIA in the last 168 hours.  CBC: Recent Labs  Lab 09/10/24 1749 09/11/24 0138 09/12/24 0257  WBC 16.6* 18.0* 16.6*  NEUTROABS 13.2*  --  13.3*  HGB 10.1* 9.7* 8.1*  HCT 30.5* 29.3* 24.5*  MCV 91.0 91.8 91.4  PLT 321 307 253    Cardiac Enzymes: No results for input(s): CKTOTAL, CKMB, CKMBINDEX, TROPONINI in the last 168 hours.  BNP: Invalid input(s): POCBNP  CBG: Recent Labs  Lab 09/10/24 2324 09/11/24 0840 09/11/24 1237 09/11/24 1712 09/11/24 2053  GLUCAP 205* 126* 135* 169* 174*  Microbiology: Results for orders placed or performed during the hospital encounter of 09/10/24  Culture, blood (Routine x 2)     Status: None (Preliminary result)   Collection Time: 09/10/24  5:49 PM   Specimen: BLOOD  Result Value Ref Range Status   Specimen Description BLOOD BLOOD RIGHT HAND  Final   Special Requests   Final    BOTTLES DRAWN AEROBIC AND ANAEROBIC Blood Culture results may not be optimal due to an inadequate volume of blood received in culture bottles   Culture   Final    NO GROWTH 2 DAYS Performed at [facility],  [address] [place], [place] [address]    Report Status PENDING  Incomplete  Resp panel by RT-PCR (RSV, Flu A&B, Covid) Anterior Nasal Swab     Status: None   Collection Time: 09/10/24  7:11 PM   Specimen: Anterior Nasal Swab  Result Value Ref Range Status   SARS Coronavirus 2 by RT PCR NEGATIVE NEGATIVE Final    Comment: (NOTE) SARS-CoV-2 target nucleic acids are NOT DETECTED.  The SARS-CoV-2 RNA is generally detectable in upper respiratory specimens during the acute phase of infection. The lowest concentration of SARS-CoV-2 viral copies this assay can detect is 138 copies/mL. A negative result does not preclude SARS-Cov-2 infection and should not be used as the sole basis for treatment or other patient management decisions. A negative result may occur with  improper specimen collection/handling, submission of specimen other than nasopharyngeal swab, presence of viral mutation(s) within the areas targeted by this assay, and inadequate number of viral copies(<138 copies/mL). A negative result must be combined with clinical observations, patient history, and epidemiological information. The expected result is Negative.  Fact Sheet for Patients:  BloggerCourse.com  Fact Sheet for Healthcare Providers:  SeriousBroker.it  This test is no t yet approved or cleared by the [place]  FDA and  has been authorized for detection and/or diagnosis of SARS-CoV-2 by FDA under an Emergency Use Authorization (EUA). This EUA will remain  in effect (meaning this test can be used) for the duration of the COVID-19 declaration under Section 564(b)(1) of the Act, 21 U.S.C.section 360bbb-3(b)(1), unless the authorization is terminated  or revoked sooner.       Influenza A by PCR NEGATIVE NEGATIVE Final   Influenza B by PCR NEGATIVE NEGATIVE Final    Comment: (NOTE) The Xpert Xpress SARS-CoV-2/FLU/RSV plus assay is intended as an aid in the  diagnosis of influenza from Nasopharyngeal swab specimens and should not be used as a sole basis for treatment. Nasal washings and aspirates are unacceptable for Xpert Xpress SARS-CoV-2/FLU/RSV testing.  Fact Sheet for Patients: BloggerCourse.com  Fact Sheet for Healthcare Providers: SeriousBroker.it  This test is not yet approved or cleared by the [place]  FDA and has been authorized for detection and/or diagnosis of SARS-CoV-2 by FDA under an Emergency Use Authorization (EUA). This EUA will remain in effect (meaning this test can be used) for the duration of the COVID-19 declaration under Section 564(b)(1) of the Act, 21 U.S.C. section 360bbb-3(b)(1), unless the authorization is terminated or revoked.     Resp Syncytial Virus by PCR NEGATIVE NEGATIVE Final    Comment: (NOTE) Fact Sheet for Patients: BloggerCourse.com  Fact Sheet for Healthcare Providers: SeriousBroker.it  This test is not yet approved or cleared by the [place]  FDA and has been authorized for detection and/or diagnosis of SARS-CoV-2 by FDA under an Emergency Use Authorization (EUA). This EUA will remain in effect (meaning this test  can be used) for the duration of the COVID-19 declaration under Section 564(b)(1) of the Act, 21 U.S.C. section 360bbb-3(b)(1), unless the authorization is terminated or revoked.  Performed at [facility], [address] [place], [place] [address]   Culture, blood (Routine x 2)     Status: None (Preliminary result)   Collection Time: 09/10/24  7:12 PM   Specimen: BLOOD  Result Value Ref Range Status   Specimen Description BLOOD RIGHT ANTECUBITAL  Final   Special Requests   Final    BOTTLES DRAWN AEROBIC AND ANAEROBIC Blood Culture results may not be optimal due to an inadequate volume of blood received in culture bottles   Culture   Final    NO GROWTH 2  DAYS Performed at [facility], [address] [place], [place] [address]    Report Status PENDING  Incomplete  Urine Culture     Status: Abnormal (Preliminary result)   Collection Time: 09/10/24 11:25 PM   Specimen: Urine, Random  Result Value Ref Range Status   Specimen Description   Final    URINE, RANDOM Performed at [facility], [address] [place], [place] [address]    Special Requests   Final    NONE Reflexed from [id number] Performed at [facility], [address] [place], [place] [address]    Culture (A)  Final    10,000 COLONIES/mL STREPTOCOCCUS GALLOLYTICUS CULTURE REINCUBATED FOR BETTER GROWTH Performed at [facility], [address] [place], [place] [address]    Report Status PENDING  Incomplete    Coagulation Studies: Recent Labs    09/10/24 1749  LABPROT 16.2*  INR 1.2    Urinalysis: Recent Labs    09/10/24 2325  COLORURINE YELLOW*  LABSPEC 1.013  PHURINE 8.0  GLUCOSEU >=500*  HGBUR NEGATIVE  BILIRUBINUR NEGATIVE  KETONESUR NEGATIVE  PROTEINUR >=300*  NITRITE NEGATIVE  LEUKOCYTESUR NEGATIVE      Imaging: US  ARTERIAL LOWER EXTREMITY DUPLEX BILATERAL Result Date: 09/11/2024 CLINICAL DATA:  RIGHT lower extremity osteomyelitis. Diabetic foot ulcer. Hypertension. Hyperlipidemia. Diabetes. Peripheral vascular disease. EXAM: BILATERAL LOWER EXTREMITY ARTERIAL DUPLEX SCAN TECHNIQUE: Gray-scale sonography as well as color Doppler and duplex ultrasound was performed to evaluate the arteries of both lower extremities including the common, superficial and profunda femoral arteries, popliteal artery and calf arteries. COMPARISON:  Ultrasound ABI 11/20/2019 FINDINGS: Right Lower Extremity Inflow: Normal common femoral arterial waveforms and velocities. Moderate amount of calcified atheromatous plaque seen in the RIGHT common femoral artery. Outflow: RIGHT profunda femoris artery is patent with triphasic waveform.  Atheromatous plaque seen throughout the RIGHT superficial femoral artery. Waveform within the proximal RIGHT superficial femoral artery is triphasic. Waveform in the mid and distal RIGHT superficial femoral artery are monophasic. RIGHT popliteal artery waveform is monophasic. No focal elevation of peak systolic velocity. Runoff: Anterior and posterior tibial arteries are diffusely calcified with monophasic waveforms. Left Lower Extremity Inflow: Atherosclerotic plaque seen throughout the LEFT common femoral artery. LEFT common femoral artery waveform is triphasic. Outflow: Profunda femoris artery is patent with multiphasic waveform. Atheromatous plaque seen throughout the LEFT superficial femoral artery with monophasic waveforms seen throughout. Monophasic waveforms seen throughout the LEFT popliteal artery. No focal elevation of peak systolic velocity identified. Runoff: LEFT anterior and posterior tibial arteries are diffusely calcified with monophasic waveforms. IMPRESSION: 1. Monophasic waveforms seen in the RIGHT mid to distal SFA, popliteal, anterior, and posterior tibial arteries indicative of stenosis at the junction of the proximal and mid 1/3 of  the SFA. 2. Monophasic waveforms seen in the LEFT SFA, popliteal, anterior, and posterior tibial arteries indicative of stenosis at the level of the proximal SFA. Electronically Signed   By: Aliene Lloyd M.D.   On: 09/11/2024 12:29   MR FOOT RIGHT WO CONTRAST Result Date: 09/11/2024 CLINICAL DATA:  Foot pain with swelling, blistering on the second toe EXAM: MRI OF THE RIGHT FOREFOOT WITHOUT CONTRAST TECHNIQUE: Multiplanar, multisequence MR imaging of the right foot from the Lisfranc joint through the toes was performed. No intravenous contrast was administered. COMPARISON:  Radiographs 09/10/2024 FINDINGS: Bones/Joint/Cartilage Osteomyelitis of the distal phalanx second toe with eroded tuft and low-grade edema within and surrounding the remaining distal phalanx.  Mild degenerative arthropathy at the midfoot and between the bases of the third and fourth metatarsals. Bifid medial first digit sesamoid. Ligaments The Lisfranc ligament appears grossly intact. Muscles and Tendons Regional muscular atrophy. Soft tissues Ulceration distally along the second toe. Cutaneous thickening in the second toe along with subcutaneous edema favoring cellulitis. Dorsal subcutaneous edema in the forefoot. No drainable abscess. IMPRESSION: 1. Osteomyelitis of the distal phalanx second toe with eroded tuft and low-grade edema within and surrounding the remaining distal phalanx. 2. Ulceration distally along the second toe with cellulitis. No drainable abscess. 3. Mild degenerative arthropathy at the midfoot and between the bases of the third and fourth metatarsals. 4. Regional muscular atrophy. Electronically Signed   By: Ryan Salvage M.D.   On: 09/11/2024 11:34   DG Foot Complete Right Result Date: 09/10/2024 CLINICAL DATA:  Wound infection with possible osteomyelitis. Shortness of breath. EXAM: RIGHT FOOT COMPLETE - 3+ VIEW COMPARISON:  None Available. FINDINGS: Soft tissue defect and soft tissue gas in the distal right second toe with underlying tuft erosions suggesting osteomyelitis with skin infection. No radiopaque foreign bodies are identified. A degenerative changes demonstrated in the interphalangeal joints and intertarsal joints. Small calcaneal spurs. No evidence of acute fracture or dislocation. Vascular calcifications. IMPRESSION: Soft tissue defect and soft tissue gas in the distal right second toe with underlying erosion of the distal phalangeal tuft suggesting osteomyelitis. Electronically Signed   By: Elsie Gravely M.D.   On: 09/10/2024 18:37   DG Chest 2 View if patient is not in a treatment room. Result Date: 09/10/2024 CLINICAL DATA:  Wound infection with possible osteomyelitis. Shortness of breath. EXAM: CHEST - 2 VIEW COMPARISON:  07/05/2024 FINDINGS:  Postoperative changes in the mediastinum. Shallow inspiration. Cardiac enlargement with mild pulmonary vascular congestion and perihilar edema, improving since previous study. Minimal bilateral pleural effusions are also improved. No pneumothorax. Mediastinal contours appear intact. Calcification of the aorta. Degenerative changes in the spine and shoulders. IMPRESSION: Cardiac enlargement with mild pulmonary vascular congestion and mild perihilar edema demonstrating improvement since previous study. Minimal pleural effusions. Electronically Signed   By: Elsie Gravely M.D.   On: 09/10/2024 18:35     Medications:    ampicillin -sulbactam (UNASYN ) IV 3 g (09/11/24 2227)   linezolid  (ZYVOX ) IV 600 mg (09/11/24 2301)    allopurinol   300 mg Oral Q breakfast   amLODipine   5 mg Oral QHS   vitamin C   500 mg Oral BID   aspirin  EC  81 mg Oral Q breakfast   Chlorhexidine  Gluconate Cloth  6 each Topical Q0600   clopidogrel   75 mg Oral Daily   feeding supplement (NEPRO CARB STEADY)  237 mL Oral TID BM   heparin   5,000 Units Subcutaneous Q8H   insulin  aspart  0-6 Units Subcutaneous TID WC  insulin  glargine  74 Units Subcutaneous Q2200   losartan   100 mg Oral QHS   metoprolol  tartrate  50 mg Oral BID   multivitamin  1 tablet Oral QHS   rosuvastatin   40 mg Oral QHS   sevelamer  carbonate  800 mg Oral TID with meals   zinc  sulfate (50mg  elemental zinc )  220 mg Oral Daily   acetaminophen , hydrALAZINE , hydrALAZINE , ondansetron  **OR** ondansetron  (ZOFRAN ) IV, senna-docusate  Assessment/ Plan:  [name] is a 63 y.o.  female  with past medical conditions including hypertension, diabetes, CAD status post CABG, CHF, stroke, and end-stage renal disease on hemodialysis.  Patient presents to the emergency department with with shortness of breath, right lower leg pain, and second right toe wound.  Patient has been admitted for Diabetic foot ulcer (HCC) [Z88.378, L97.509] Cellulitis of right lower  extremity [L03.115] Acute osteomyelitis of toe, right (HCC) [M86.171]   End stage renal disease on hemodialysis. Last treatment received on Monday. Receiving treatment today, UF 2.5L as tolerated. Next treatment scheduled for Friday.   2. Osteomyelitis, right second toe. Foot imaging concerning for osteomyelitis. Vascular consulted to evaluate circulation. Podiatry planning amputation.   3. Anemia of chronic kidney disease Lab Results  Component Value Date   HGB 8.1 (L) 09/12/2024    Hemoglobin decreased, 8.1.  Patient does receive Mircera at outpatient clinic.  Will order Retacrit  with next treatment.  4. Secondary Hyperparathyroidism: with outpatient labs: PTH 260, phosphorus 7.7, calcium  9.8 on 08/27/24.   Lab Results  Component Value Date   CALCIUM  8.9 09/12/2024   CAION 1.27 02/06/2021   PHOS 5.0 (H) 09/12/2024    Bone mineral is acceptable at this time.  Will continue to monitor for now.   LOS: 2 Berk Pilot 9/24/202512:20 PM .

## 2024-09-12 NOTE — Plan of Care (Signed)
   Problem: Coping: Goal: Ability to adjust to condition or change in health will improve Outcome: Progressing   Problem: Fluid Volume: Goal: Ability to maintain a balanced intake and output will improve Outcome: Progressing   Problem: Health Behavior/Discharge Planning: Goal: Ability to manage health-related needs will improve Outcome: Progressing   Problem: Nutritional: Goal: Maintenance of adequate nutrition will improve Outcome: Progressing

## 2024-09-12 NOTE — Progress Notes (Signed)
  Received patient in bed to unit.   Informed consent signed and in chart.    TX duration: 3.5hrs     Transported back to floor Hand-off given to patient's nurse. Pt c/o 10/10 pain R Leg and foot   tylenol  given   Access used: L AVF Access issues: none   Total UF removed: 2.5L Medication(s) given: tylenol  Post HD VS: wnl      Olivia Hurst LPN [facility]

## 2024-09-13 ENCOUNTER — Encounter
Admission: EM | Disposition: A | Discharge status: Home / Self Care | Payer: Self-pay | Source: Home / Self Care | Attending: Hospitalist

## 2024-09-13 ENCOUNTER — Encounter: Payer: Self-pay | Admitting: Vascular Surgery

## 2024-09-13 DIAGNOSIS — L97514 Non-pressure chronic ulcer of other part of right foot with necrosis of bone: Secondary | ICD-10-CM | POA: Diagnosis not present

## 2024-09-13 DIAGNOSIS — I700 Atherosclerosis of aorta: Secondary | ICD-10-CM | POA: Diagnosis not present

## 2024-09-13 DIAGNOSIS — I70239 Atherosclerosis of native arteries of right leg with ulceration of unspecified site: Secondary | ICD-10-CM

## 2024-09-13 DIAGNOSIS — Z992 Dependence on renal dialysis: Secondary | ICD-10-CM | POA: Diagnosis not present

## 2024-09-13 DIAGNOSIS — L97919 Non-pressure chronic ulcer of unspecified part of right lower leg with unspecified severity: Secondary | ICD-10-CM

## 2024-09-13 DIAGNOSIS — E11621 Type 2 diabetes mellitus with foot ulcer: Secondary | ICD-10-CM | POA: Diagnosis not present

## 2024-09-13 HISTORY — PX: LOWER EXTREMITY ANGIOGRAPHY: CATH118251

## 2024-09-13 LAB — COMPREHENSIVE METABOLIC PANEL WITH GFR
ALT: 39 U/L (ref 0–44)
AST: 38 U/L (ref 15–41)
Albumin: 2.8 g/dL — ABNORMAL LOW (ref 3.5–5.0)
Alkaline Phosphatase: 56 U/L (ref 38–126)
Anion gap: 13 (ref 5–15)
BUN: 45 mg/dL — ABNORMAL HIGH (ref 8–23)
CO2: 25 mmol/L (ref 22–32)
Calcium: 8.7 mg/dL — ABNORMAL LOW (ref 8.9–10.3)
Chloride: 95 mmol/L — ABNORMAL LOW (ref 98–111)
Creatinine, Ser: 4.77 mg/dL — ABNORMAL HIGH (ref 0.44–1.00)
GFR, Estimated: 10 mL/min — ABNORMAL LOW (ref >60)
Glucose, Bld: 257 mg/dL — ABNORMAL HIGH (ref 70–99)
Potassium: 3.2 mmol/L — ABNORMAL LOW (ref 3.5–5.1)
Sodium: 133 mmol/L — ABNORMAL LOW (ref 135–145)
Total Bilirubin: 0.5 mg/dL (ref 0.0–1.2)
Total Protein: 6.6 g/dL (ref 6.5–8.1)

## 2024-09-13 LAB — CBC WITH DIFFERENTIAL/PLATELET
Abs Immature Granulocytes: 0.17 K/uL — ABNORMAL HIGH (ref 0.00–0.07)
Basophils Absolute: 0.1 K/uL (ref 0.0–0.1)
Basophils Relative: 1 %
Eosinophils Absolute: 0.4 K/uL (ref 0.0–0.5)
Eosinophils Relative: 2 %
HCT: 27.5 % — ABNORMAL LOW (ref 36.0–46.0)
Hemoglobin: 9.1 g/dL — ABNORMAL LOW (ref 12.0–15.0)
Immature Granulocytes: 1 %
Lymphocytes Relative: 9 %
Lymphs Abs: 1.5 K/uL (ref 0.7–4.0)
MCH: 30.1 pg (ref 26.0–34.0)
MCHC: 33.1 g/dL (ref 30.0–36.0)
MCV: 91.1 fL (ref 80.0–100.0)
Monocytes Absolute: 1.7 K/uL — ABNORMAL HIGH (ref 0.1–1.0)
Monocytes Relative: 10 %
Neutro Abs: 12.9 K/uL — ABNORMAL HIGH (ref 1.7–7.7)
Neutrophils Relative %: 77 %
Platelets: 303 K/uL (ref 150–400)
RBC: 3.02 MIL/uL — ABNORMAL LOW (ref 3.87–5.11)
RDW: 14.1 % (ref 11.5–15.5)
WBC: 16.8 K/uL — ABNORMAL HIGH (ref 4.0–10.5)
nRBC: 0 % (ref 0.0–0.2)

## 2024-09-13 LAB — GLUCOSE, CAPILLARY
Glucose-Capillary: 187 mg/dL — ABNORMAL HIGH (ref 70–99)
Glucose-Capillary: 127 mg/dL — ABNORMAL HIGH (ref 70–99)
Glucose-Capillary: 207 mg/dL — ABNORMAL HIGH (ref 70–99)
Glucose-Capillary: 134 mg/dL — ABNORMAL HIGH (ref 70–99)

## 2024-09-13 LAB — PHOSPHORUS: Phosphorus: 5.2 mg/dL — ABNORMAL HIGH (ref 2.5–4.6)

## 2024-09-13 LAB — MAGNESIUM: Magnesium: 2.3 mg/dL (ref 1.7–2.4)

## 2024-09-13 SURGERY — LOWER EXTREMITY ANGIOGRAPHY
Anesthesia: Moderate Sedation | Laterality: Right

## 2024-09-13 MED ORDER — METHYLPREDNISOLONE SODIUM SUCC 125 MG IJ SOLR: 125.0000 mg | Freq: Once | INTRAMUSCULAR | Status: DC | PRN

## 2024-09-13 MED ORDER — HEPARIN SODIUM (PORCINE) 1000 UNIT/ML IJ SOLN
INTRAMUSCULAR | Status: AC
  Filled 2024-09-13: qty 10

## 2024-09-13 MED ORDER — SODIUM CHLORIDE 0.9% FLUSH: 3.0000 mL | INTRAVENOUS | Status: DC | PRN

## 2024-09-13 MED ORDER — FENTANYL CITRATE (PF) 100 MCG/2ML IJ SOLN
INTRAMUSCULAR | Status: AC
  Filled 2024-09-13: qty 2

## 2024-09-13 MED ORDER — DIPHENHYDRAMINE HCL 50 MG/ML IJ SOLN: 50.0000 mg | Freq: Once | INTRAMUSCULAR | Status: DC | PRN

## 2024-09-13 MED ORDER — FAMOTIDINE 20 MG PO TABS: 40.0000 mg | ORAL_TABLET | Freq: Once | ORAL | Status: DC | PRN

## 2024-09-13 MED ORDER — LIDOCAINE HCL (PF) 1 % IJ SOLN
INTRAMUSCULAR | Status: DC | PRN
  Administered 2024-09-13: 10 mL

## 2024-09-13 MED ORDER — SODIUM CHLORIDE 0.9% FLUSH
3.0000 mL | Freq: Two times a day (BID) | INTRAVENOUS | Status: DC
  Administered 2024-09-13 – 2024-09-16 (×7): 3 mL via INTRAVENOUS

## 2024-09-13 MED ORDER — ALLOPURINOL 100 MG PO TABS
200.0000 mg | ORAL_TABLET | Freq: Every day | ORAL | Status: DC
Start: 2024-09-14 — End: 2024-09-16
  Administered 2024-09-14 – 2024-09-16 (×3): 200 mg via ORAL
  Filled 2024-09-13 (×3): qty 2

## 2024-09-13 MED ORDER — IODIXANOL 320 MG/ML IV SOLN
INTRAVENOUS | Status: DC | PRN
  Administered 2024-09-13: 80 mL

## 2024-09-13 MED ORDER — HEPARIN SODIUM (PORCINE) 1000 UNIT/ML IJ SOLN
INTRAMUSCULAR | Status: DC | PRN
  Administered 2024-09-13: 6000 [IU] via INTRAVENOUS

## 2024-09-13 MED ORDER — MIDAZOLAM HCL 2 MG/ML PO SYRP: 8.0000 mg | ORAL_SOLUTION | Freq: Once | ORAL | Status: DC | PRN

## 2024-09-13 MED ORDER — CLOPIDOGREL BISULFATE 75 MG PO TABS
75.0000 mg | ORAL_TABLET | Freq: Every day | ORAL | Status: DC
Start: 2024-09-14 — End: 2024-09-16
  Administered 2024-09-14 – 2024-09-16 (×3): 75 mg via ORAL
  Filled 2024-09-13 (×3): qty 1

## 2024-09-13 MED ORDER — CLOPIDOGREL BISULFATE 75 MG PO TABS
300.0000 mg | ORAL_TABLET | ORAL | Status: AC
Start: 2024-09-13 — End: 2024-09-13
  Administered 2024-09-13: 300 mg via ORAL
  Filled 2024-09-13: qty 4

## 2024-09-13 MED ORDER — HEPARIN (PORCINE) IN NACL 1000-0.9 UT/500ML-% IV SOLN
INTRAVENOUS | Status: DC | PRN
  Administered 2024-09-13: 1000 mL

## 2024-09-13 MED ORDER — MIDAZOLAM HCL 5 MG/5ML IJ SOLN
INTRAMUSCULAR | Status: AC
  Filled 2024-09-13: qty 5

## 2024-09-13 MED ORDER — SODIUM CHLORIDE 0.9 % IV SOLN: INTRAVENOUS | Status: AC

## 2024-09-13 MED ORDER — CEFAZOLIN SODIUM-DEXTROSE 1-4 GM/50ML-% IV SOLN
1.0000 g | INTRAVENOUS | Status: DC
Start: 2024-09-13 — End: 2024-09-13
  Filled 2024-09-13: qty 50

## 2024-09-13 MED ORDER — EPOETIN ALFA-EPBX 10000 UNIT/ML IJ SOLN
4000.0000 [IU] | INTRAMUSCULAR | Status: DC
  Administered 2024-09-14: 4000 [IU] via INTRAVENOUS
  Filled 2024-09-13: qty 2

## 2024-09-13 MED ORDER — SODIUM CHLORIDE 0.9 % IV SOLN: 250.0000 mL | INTRAVENOUS | Status: AC | PRN

## 2024-09-13 MED ORDER — SODIUM CHLORIDE 0.9 % IV SOLN: INTRAVENOUS | Status: DC

## 2024-09-13 MED ORDER — FENTANYL CITRATE (PF) 100 MCG/2ML IJ SOLN
INTRAMUSCULAR | Status: DC | PRN
  Administered 2024-09-13 (×7): 25 ug via INTRAVENOUS

## 2024-09-13 MED ORDER — MIDAZOLAM HCL 2 MG/2ML IJ SOLN
INTRAMUSCULAR | Status: DC | PRN
  Administered 2024-09-13 (×3): .5 mg via INTRAVENOUS
  Administered 2024-09-13: 1 mg via INTRAVENOUS
  Administered 2024-09-13 (×2): .5 mg via INTRAVENOUS
  Administered 2024-09-13: 1 mg via INTRAVENOUS

## 2024-09-13 SURGICAL SUPPLY — 26 items
BALLOON LUTONIX 5X150X130 (BALLOONS) IMPLANT
BALLOON LUTONIX DCB 4X100X130 (BALLOONS) IMPLANT
BALLOON LUTONIX DCB 6X40X130 (BALLOONS) IMPLANT
BALLOON LUTONIX DCB 6X80X130 (BALLOONS) IMPLANT
CATH ANGIO 5F PIGTAIL 65CM (CATHETERS) IMPLANT
CATH BEACON 5 .038 100 VERT TP (CATHETERS) IMPLANT
COVER PROBE ULTRASOUND 5X96 (MISCELLANEOUS) IMPLANT
DEVICE PRESTO INFLATION (MISCELLANEOUS) IMPLANT
DEVICE STARCLOSE SE CLOSURE (Vascular Products) IMPLANT
GLIDEWIRE ADV .035X260CM (WIRE) IMPLANT
GOWN STRL REUS W/ TWL LRG LVL3 (GOWN DISPOSABLE) ×1 IMPLANT
GUIDEWIRE SUPER STIFF .035X180 (WIRE) IMPLANT
KIT MICROPUNCTURE VSI 5F STIFF (SHEATH) IMPLANT
NDL ENTRY 21GA 7CM ECHOTIP (NEEDLE) IMPLANT
NEEDLE ENTRY 21GA 7CM ECHOTIP (NEEDLE) ×1 IMPLANT
PACK ANGIOGRAPHY (CUSTOM PROCEDURE TRAY) ×1 IMPLANT
PANNUS RETENTION SYSTEM 2 PAD (MISCELLANEOUS) IMPLANT
SET INTRO CAPELLA COAXIAL (SET/KITS/TRAYS/PACK) IMPLANT
SHEATH ANL2 6FRX45 HC (SHEATH) IMPLANT
SHEATH BRITE TIP 5FRX11 (SHEATH) IMPLANT
STENT LIFESTENT 5F 6X150X135 (Permanent Stent) IMPLANT
STENT LIFESTENT 5F 7X80X135 (Permanent Stent) IMPLANT
SYR MEDRAD MARK 7 150ML (SYRINGE) IMPLANT
TUBING CONTRAST HIGH PRESS 72 (TUBING) IMPLANT
WIRE G V18X300CM (WIRE) IMPLANT
WIRE J 3MM .035X145CM (WIRE) IMPLANT

## 2024-09-13 NOTE — Plan of Care (Signed)

## 2024-09-13 NOTE — Progress Notes (Signed)
 [facility]  ROUNDING NOTE   Subjective:   Sharon Arias is a 63 year old female with past medical conditions including hypertension, diabetes, CAD status post CABG, CHF, stroke, and end-stage renal disease on hemodialysis.  Patient presents to the emergency department with shortness of breath, right lower leg pain, and second right toe wound.  Patient has been admitted for Diabetic foot ulcer (HCC) [E11.621, L97.509] Cellulitis of right lower extremity [L03.115] Acute osteomyelitis of toe, right [facility]) [M86.171]  Patient is known to our practice and receives outpatient dialysis treatments at [facility] on a MWF schedule, supervised by Dr. Douglas.  Update: Patient seen after vascular procedure Arousable  Denies pain or discomfort   Objective:  Vital signs in last 24 hours:  Temp:  [97.7 F (36.5 C)-99 F (37.2 C)] 97.7 F (36.5 C) (09/25 1115) Pulse Rate:  [57-76] 71 (09/25 1115) Resp:  [14-19] 18 (09/25 1115) BP: (102-166)/(42-81) 145/49 (09/25 1115) SpO2:  [91 %-100 %] 99 % (09/25 1115)  Weight change:  Filed Weights   09/10/24 1755 09/11/24 0048  Weight: 109.3 kg 113 kg    Intake/Output: I/O last 3 completed shifts: In: 688.7 [P.O.:240; IV Piggyback:448.7] Out: 3000 [Urine:500; Other:2500]   Intake/Output this shift:  No intake/output data recorded.  Physical Exam: General: NAD  Head: Normocephalic, atraumatic. Moist oral mucosal membranes  Eyes: Anicteric  Lungs:  Clear to auscultation  Heart: Regular rate and rhythm  Abdomen:  Soft, nontender  Extremities: Trace peripheral edema.  Neurologic: Awake, alert, conversant  Skin: Warm,dry, right second  digit wound/necrotic  Access: Left AVF    Basic Metabolic Panel: Recent Labs  Lab 09/10/24 1749 09/11/24 0138 09/12/24 0257 09/13/24 0421  NA 136 136 137 133*  K 2.9* 3.3* 3.3* 3.2*  CL 96* 98 100 95*  CO2 26 26 23 25   GLUCOSE 123* 161* 147* 257*  BUN 27* 31* 49* 45*  CREATININE  3.48* 3.80* 5.12* 4.77*  CALCIUM  8.7* 8.7* 8.9 8.7*  MG  --   --  2.1 2.3  PHOS  --   --  5.0* 5.2*    Liver Function Tests: Recent Labs  Lab 09/10/24 1749 09/12/24 0257 09/13/24 0421  AST 30 17 38  ALT 31 24 39  ALKPHOS 47 43 56  BILITOT 0.9 0.8 0.5  PROT 7.8 6.4* 6.6  ALBUMIN 3.4* 2.7* 2.8*   No results for input(s): LIPASE, AMYLASE in the last 168 hours. No results for input(s): AMMONIA in the last 168 hours.  CBC: Recent Labs  Lab 09/10/24 1749 09/11/24 0138 09/12/24 0257 09/13/24 0421  WBC 16.6* 18.0* 16.6* 16.8*  NEUTROABS 13.2*  --  13.3* 12.9*  HGB 10.1* 9.7* 8.1* 9.1*  HCT 30.5* 29.3* 24.5* 27.5*  MCV 91.0 91.8 91.4 91.1  PLT 321 307 253 303    Cardiac Enzymes: No results for input(s): CKTOTAL, CKMB, CKMBINDEX, TROPONINI in the last 168 hours.  BNP: Invalid input(s): POCBNP  CBG: Recent Labs  Lab 09/11/24 2053 09/12/24 1239 09/12/24 1645 09/12/24 2024 09/13/24 0739  GLUCAP 174* 81 170* 269* 187*    Microbiology: Results for orders placed or performed during the hospital encounter of 09/10/24  Culture, blood (Routine x 2)     Status: None (Preliminary result)   Collection Time: 09/10/24  5:49 PM   Specimen: BLOOD  Result Value Ref Range Status   Specimen Description BLOOD BLOOD RIGHT HAND  Final   Special Requests   Final    BOTTLES DRAWN AEROBIC AND ANAEROBIC  Blood Culture results may not be optimal due to an inadequate volume of blood received in culture bottles   Culture   Final    NO GROWTH 3 DAYS Performed at [facility], [address] [place], [place] [address]    Report Status PENDING  Incomplete  Resp panel by RT-PCR (RSV, Flu A&B, Covid) Anterior Nasal Swab     Status: None   Collection Time: 09/10/24  7:11 PM   Specimen: Anterior Nasal Swab  Result Value Ref Range Status   SARS Coronavirus 2 by RT PCR NEGATIVE NEGATIVE Final    Comment: (NOTE) SARS-CoV-2 target nucleic acids are NOT  DETECTED.  The SARS-CoV-2 RNA is generally detectable in upper respiratory specimens during the acute phase of infection. The lowest concentration of SARS-CoV-2 viral copies this assay can detect is 138 copies/mL. A negative result does not preclude SARS-Cov-2 infection and should not be used as the sole basis for treatment or other patient management decisions. A negative result may occur with  improper specimen collection/handling, submission of specimen other than nasopharyngeal swab, presence of viral mutation(s) within the areas targeted by this assay, and inadequate number of viral copies(<138 copies/mL). A negative result must be combined with clinical observations, patient history, and epidemiological information. The expected result is Negative.  Fact Sheet for Patients:  BloggerCourse.com  Fact Sheet for Healthcare Providers:  SeriousBroker.it  This test is no t yet approved or cleared by the [place]  FDA and  has been authorized for detection and/or diagnosis of SARS-CoV-2 by FDA under an Emergency Use Authorization (EUA). This EUA will remain  in effect (meaning this test can be used) for the duration of the COVID-19 declaration under Section 564(b)(1) of the Act, 21 U.S.C.section 360bbb-3(b)(1), unless the authorization is terminated  or revoked sooner.       Influenza A by PCR NEGATIVE NEGATIVE Final   Influenza B by PCR NEGATIVE NEGATIVE Final    Comment: (NOTE) The Xpert Xpress SARS-CoV-2/FLU/RSV plus assay is intended as an aid in the diagnosis of influenza from Nasopharyngeal swab specimens and should not be used as a sole basis for treatment. Nasal washings and aspirates are unacceptable for Xpert Xpress SARS-CoV-2/FLU/RSV testing.  Fact Sheet for Patients: BloggerCourse.com  Fact Sheet for Healthcare Providers: SeriousBroker.it  This test is not yet  approved or cleared by the [place]  FDA and has been authorized for detection and/or diagnosis of SARS-CoV-2 by FDA under an Emergency Use Authorization (EUA). This EUA will remain in effect (meaning this test can be used) for the duration of the COVID-19 declaration under Section 564(b)(1) of the Act, 21 U.S.C. section 360bbb-3(b)(1), unless the authorization is terminated or revoked.     Resp Syncytial Virus by PCR NEGATIVE NEGATIVE Final    Comment: (NOTE) Fact Sheet for Patients: BloggerCourse.com  Fact Sheet for Healthcare Providers: SeriousBroker.it  This test is not yet approved or cleared by the [place]  FDA and has been authorized for detection and/or diagnosis of SARS-CoV-2 by FDA under an Emergency Use Authorization (EUA). This EUA will remain in effect (meaning this test can be used) for the duration of the COVID-19 declaration under Section 564(b)(1) of the Act, 21 U.S.C. section 360bbb-3(b)(1), unless the authorization is terminated or revoked.  Performed at [facility], [address] [place], [place] [address]   Culture, blood (Routine x 2)     Status: None (Preliminary result)   Collection Time: 09/10/24  7:12 PM   Specimen: BLOOD  Result Value  Ref Range Status   Specimen Description BLOOD RIGHT ANTECUBITAL  Final   Special Requests   Final    BOTTLES DRAWN AEROBIC AND ANAEROBIC Blood Culture results may not be optimal due to an inadequate volume of blood received in culture bottles   Culture   Final    NO GROWTH 3 DAYS Performed at [facility], [address] [place], [place] [address]    Report Status PENDING  Incomplete  Urine Culture     Status: Abnormal (Preliminary result)   Collection Time: 09/10/24 11:25 PM   Specimen: Urine, Random  Result Value Ref Range Status   Specimen Description   Final    URINE, RANDOM Performed at [facility], [address], [place], [place] [address]    Special Requests   Final    NONE Reflexed from [phone] Performed at [facility], [address] [place], [place] [address]    Culture (A)  Final    10,000 COLONIES/mL STREPTOCOCCUS GALLOLYTICUS CULTURE REINCUBATED FOR BETTER GROWTH Performed at [facility], [address] [place], [place] [address]    Report Status PENDING  Incomplete    Coagulation Studies: Recent Labs    09/10/24 1749  LABPROT 16.2*  INR 1.2    Urinalysis: Recent Labs    09/10/24 2325  COLORURINE YELLOW*  LABSPEC 1.013  PHURINE 8.0  GLUCOSEU >=500*  HGBUR NEGATIVE  BILIRUBINUR NEGATIVE  KETONESUR NEGATIVE  PROTEINUR >=300*  NITRITE NEGATIVE  LEUKOCYTESUR NEGATIVE      Imaging: PERIPHERAL VASCULAR CATHETERIZATION Result Date: 09/13/2024 See surgical note for result.  US  ARTERIAL LOWER EXTREMITY DUPLEX BILATERAL Result Date: 09/11/2024 CLINICAL DATA:  RIGHT lower extremity osteomyelitis. Diabetic foot ulcer. Hypertension. Hyperlipidemia. Diabetes. Peripheral vascular disease. EXAM: BILATERAL LOWER EXTREMITY ARTERIAL DUPLEX SCAN TECHNIQUE: Gray-scale sonography as well as color Doppler and duplex ultrasound was performed to evaluate the arteries of both lower extremities including the common, superficial and profunda femoral arteries, popliteal artery and calf arteries. COMPARISON:  Ultrasound ABI 11/20/2019 FINDINGS: Right Lower Extremity Inflow: Normal common femoral arterial waveforms and velocities. Moderate amount of calcified atheromatous plaque seen in the RIGHT common femoral artery. Outflow: RIGHT profunda femoris artery is patent with triphasic waveform. Atheromatous plaque seen throughout the RIGHT superficial femoral artery. Waveform within the proximal RIGHT superficial femoral artery is triphasic. Waveform in the mid and distal RIGHT superficial femoral artery are monophasic. RIGHT popliteal artery waveform is monophasic. No focal elevation of  peak systolic velocity. Runoff: Anterior and posterior tibial arteries are diffusely calcified with monophasic waveforms. Left Lower Extremity Inflow: Atherosclerotic plaque seen throughout the LEFT common femoral artery. LEFT common femoral artery waveform is triphasic. Outflow: Profunda femoris artery is patent with multiphasic waveform. Atheromatous plaque seen throughout the LEFT superficial femoral artery with monophasic waveforms seen throughout. Monophasic waveforms seen throughout the LEFT popliteal artery. No focal elevation of peak systolic velocity identified. Runoff: LEFT anterior and posterior tibial arteries are diffusely calcified with monophasic waveforms. IMPRESSION: 1. Monophasic waveforms seen in the RIGHT mid to distal SFA, popliteal, anterior, and posterior tibial arteries indicative of stenosis at the junction of the proximal and mid 1/3 of the SFA. 2. Monophasic waveforms seen in the LEFT SFA, popliteal, anterior, and posterior tibial arteries indicative of stenosis at the level of the proximal SFA. Electronically Signed   By: Aliene Lloyd M.D.   On: 09/11/2024 12:29     Medications:    sodium chloride      sodium chloride   ampicillin -sulbactam (UNASYN ) IV 3 g (09/13/24 0127)   linezolid  (ZYVOX ) IV 600 mg (09/12/24 2216)    allopurinol   300 mg Oral Q breakfast   amLODipine   5 mg Oral QHS   vitamin C   500 mg Oral BID   aspirin  EC  81 mg Oral Q breakfast   Chlorhexidine  Gluconate Cloth  6 each Topical Q0600   clopidogrel   300 mg Oral STAT   [START ON 09/14/2024] clopidogrel   75 mg Oral Q breakfast   feeding supplement (NEPRO CARB STEADY)  237 mL Oral TID BM   heparin   5,000 Units Subcutaneous Q8H   insulin  aspart  0-6 Units Subcutaneous TID WC   insulin  glargine  65 Units Subcutaneous Q2200   losartan   100 mg Oral QHS   metoprolol  tartrate  50 mg Oral BID   multivitamin  1 tablet Oral QHS   rosuvastatin   40 mg Oral QHS   sevelamer  carbonate  800 mg Oral TID with meals    sodium chloride  flush  3 mL Intravenous Q12H   zinc  sulfate (50mg  elemental zinc )  220 mg Oral Daily   sodium chloride , acetaminophen , hydrALAZINE , hydrALAZINE , ondansetron  **OR** ondansetron  (ZOFRAN ) IV, senna-docusate, sodium chloride  flush  Assessment/ Plan:  Sharon Arias is a 63 y.o.  female  with past medical conditions including hypertension, diabetes, CAD status post CABG, CHF, stroke, and end-stage renal disease on hemodialysis.  Patient presents to the emergency department with with shortness of breath, right lower leg pain, and second right toe wound.  Patient has been admitted for Diabetic foot ulcer (HCC) [Z88.378, L97.509] Cellulitis of right lower extremity [L03.115] Acute osteomyelitis of toe, right (HCC) [M86.171]   End stage renal disease on hemodialysis. Received dialysis yesterday, UF 2.5L achieved. Next treatment scheduled for Friday.   2. Osteomyelitis, right second toe. Foot imaging concerning for osteomyelitis. Podiatry planning amputation. Vascular surgery performed angiogram on 9/25  3. Anemia of chronic kidney disease Lab Results  Component Value Date   HGB 9.1 (L) 09/13/2024    Hemoglobin 9.1.  Patient does receive Mircera at outpatient clinic. Will order low dose retacrit  with dialysis.   4. Secondary Hyperparathyroidism: with outpatient labs: PTH 260, phosphorus 7.7, calcium  9.8 on 08/27/24.   Lab Results  Component Value Date   CALCIUM  8.7 (L) 09/13/2024   CAION 1.27 02/06/2021   PHOS 5.2 (H) 09/13/2024    Calcium  and phos within optimal range.    LOS: 3 Amory Zbikowski 9/25/202511:47 AM .

## 2024-09-13 NOTE — Op Note (Signed)
 [facility]  Percutaneous Study/Intervention Procedural Note   Date of Surgery: 09/13/2024  Surgeon:  Cordella JUDITHANN Shawl, MD.  Pre-operative Diagnosis: Bilateral lower extremity nonhealing ulceration of the right lower extremity.  Post-operative diagnosis:  Same  Procedure(s) Performed:             1.  Introduction catheter into right lower extremity 3rd order catheter placement              2.  Contrast injection right lower extremity for distal runoff             3.  Percutaneous transluminal angioplasty and stent placement right superficial femoral artery and popliteal in two locations             4.   Star close closure of the left common femoral arteriotomy  Anesthesia: Conscious sedation was administered by the radiology RN under my direct supervision. IV Versed  plus fentanyl  were utilized. Continuous ECG, pulse oximetry and blood pressure was monitored throughout the entire procedure. Conscious sedation was for a total of 1 hour 10 minutes  and 59 seconds.  Sheath: 6 Fr Ansell sheath left common femoral retrograde  Contrast: 80 cc  Fluoroscopy Time: 7.5 minutes  Indications:  Sharon Arias presents with the risks and benefits are reviewed all questions answered patient agrees to proceed.  Procedure:  Sharon Arias is a 63 y.o. y.o. female who was identified and appropriate procedural time out was performed.  The patient was then placed supine on the table and prepped and draped in the usual sterile fashion.    Ultrasound was placed in the sterile sleeve and the left groin was evaluated the left common femoral artery was echolucent and pulsatile indicating patency.  Image was recorded for the permanent record and under real-time visualization a microneedle was inserted into the common femoral artery microwire followed by a micro-sheath.  A J-wire was then advanced through the micro-sheath and a  5 [place] sheath was then inserted over a J-wire. J-wire was  then advanced and a 5 French pigtail catheter was positioned at the level of T12. AP projection of the aorta was then obtained. Pigtail catheter was repositioned to above the bifurcation and a LAO view of the pelvis was obtained.  Subsequently a pigtail catheter with the stiff angle Glidewire was used to cross the aortic bifurcation the catheter wire were advanced down into the right distal external iliac artery. Oblique view of the femoral bifurcation was then obtained and subsequently the wire was reintroduced and the pigtail catheter negotiated into the SFA representing third order catheter placement. Distal runoff was then performed.  6000 units of heparin  was then given and allowed to circulate and a 6 [place] Ansell sheath was advanced up and over the bifurcation and positioned in the common femoral artery  Using a Kumpe catheter and the advantage wire I was able to negotiate the subtotal occlusion at Hunter's canal.  A 4 mm x 100 mm Lutonix drug-eluting balloon was used to angioplasty the superficial femoral and above-knee popliteal arteries at Hunter's canal. Inflation was to 10 atmospheres for 2 and 1 minute.  Follow-up imaging demonstrated persistent greater than 60% stenosis and a 6 mm x 150 mm life stent was deployed beginning in the mid popliteal and extending proximally.  This was then postdilated with a 5 mm x 150 mm Lutonix drug-eluting balloon inflated to 12 atm for approximately 1 minute.  Follow-up imaging demonstrated persistent stenosis in the region that  was most narrowed subsequently, a 6 mm x 40 mm balloon was utilized inflating to 12 atm for 1 minute. Follow-up imaging demonstrated less than 10% residual stenosis.  The detector was then positioned more proximally in the SFA proper.  A 60 to 70% stenosis located 2 to 3 cm from the origin was reidentified.  A 6 mm x 80 mm Lutonix drug-eluting balloon was advanced across this area and inflated to 8 to 10 atm for approximately 1 minute.   Follow-up imaging demonstrated greater than 50% residual stenosis and a 7 mm x 80 mm life stent was deployed and postdilated with a 6 mm balloon inflated to 8 atm for 1 minute.  After review of these images the sheath is pulled into the left external iliac oblique of the common femoral is obtained and a Star close device deployed. There no immediate Complications.  Findings:  The abdominal aorta is opacified with a bolus injection contrast. Renal arteries are poorly visualized on the right consistent with her hemodialysis however the left renal artery is patent and does fill the distal renal vasculature however there is a very poor nephrogram.. The aorta itself has diffuse disease but no hemodynamically significant lesions. The common and external iliac arteries are widely patent bilaterally.  The right common femoral is widely patent as is the profunda femoris.  The SFA does indeed have a significant stenosis in 2 locations.  2 to 3 cm distal to the origin there is a 60 to 70% stenosis and then beginning at Hunter's canal there is a subtotal occlusion which extends to the level of femoral condyles into the above-knee popliteal.  The distal popliteal patent and free of hemodynamically significant stenosis and the trifurcation is patent with three-vessel runoff to the foot.  Anterior tibial is the dominant distal flow.  Following angioplasty and stent placement in 2 locations the SFA and above-knee popliteal is now widely patent with less than 10% residual stenosis.                            Disposition: Patient was taken to the recovery room in stable condition having tolerated the procedure well.  [name] 09/13/2024,11:02 AM

## 2024-09-13 NOTE — Progress Notes (Signed)
  PROGRESS NOTE    Sharon Arias  FMW:[id number] DOB: 10/10/1961 DOA: 09/10/2024 PCP: Auston Reyes BIRCH, MD  132A/132A-AA  LOS: 3 days   Brief hospital course:   Assessment & Plan: Sharon Arias 63 year old female with diabetes, chronic diabetic neuropathy, ESRD on HD, diabetic retinopathy with clinical blindness, diastolic congestive heart failure, CAD status post CABG, obesity, OSA on CPAP, who presents with right-sided foot pain.  Patient reports she had a small blister on the second toe of her foot that she noticed 3 days ago, she reports the swelling and blistering became worse so she presented to the ED.  She denies any fever or chills at home.  MRI of the foot confirms osteomyelitis, arterial Dopplers confirm significant PAD.  Podiatry and vascular surgery were consulted.    Osteomyelitis of right second toe Cellulitis --cont Unasyn  and Linezolid  --toe amputation on Friday  PAD - Duplex arterial ultrasounds show significant stenosis.  Vascular surgery consulted --angiogram today with angioplasty and stent placement right common superficial femoral artery and popliteal.   ESRD on hemodialysis MWF Hyperphos - iHD per nephro --cont Renvela    Type 2 diabetes on insulin  therapy - Most recent hemoglobin A1c 6.7 - Home dose 74 units of Tresiba  --cont glargine at lower 65u nightly --ACHS and SSI   Hypertension --cont amlodipine , losartan  and lopressor    Heart failure with preserved EF, not currently in exacerbation - Clinically euvolemic at this time   CAD status post CABG History of CVA - cont aspirin , Plavix , statin.   Hypokalemia --supplement per nephro  Anemia of CKD - Stable.    DVT prophylaxis: Heparin  SQ Code Status: Full code  Family Communication:  Level of care: Med-Surg Dispo:   The patient is from: home Anticipated d/c is to: home Anticipated d/c date is: 2-3 days   Subjective and Interval History:  Underwent angiogram today with angioplasty and  stent placement right common superficial femoral artery and popliteal.   Objective: Vitals:   09/13/24 1100 09/13/24 1115 09/13/24 1206 09/13/24 1514  BP: (!) 148/50 (!) 145/49 (!) 150/54 (!) 154/66  Pulse: 67 71 72 76  Resp: 15 18 16 16   Temp:  97.7 F (36.5 C) 98.9 F (37.2 C) 99 F (37.2 C)  TempSrc:  Temporal Oral   SpO2: 97% 99% 99% 98%  Weight:      Height:        Intake/Output Summary (Last 24 hours) at 09/13/2024 1900 Last data filed at 09/12/2024 1941 Gross per 24 hour  Intake 240 ml  Output --  Net 240 ml   Filed Weights   09/10/24 1755 09/11/24 0048  Weight: 109.3 kg 113 kg    Examination:   Constitutional: NAD, AAOx3 HEENT: conjunctivae and lids normal, EOMI CV: No cyanosis.   RESP: normal respiratory effort, on RA Neuro: II - XII grossly intact.   Psych: Normal mood and affect.  Appropriate judgement and reason   Data Reviewed: I have personally reviewed labs and imaging studies  Time spent: 35 minutes  Ellouise Haber, MD [facility] If 7PM-7AM, please contact night-coverage 09/13/2024, 7:00 PM

## 2024-09-13 NOTE — Care Management Important Message (Signed)
 Important Message  Patient Details  Name: Sharon Arias MRN: [id number] Date of Birth: 23-May-1961   Important Message Given:  Yes - Medicare IM     Calie Buttrey W, CMA 09/13/2024, 12:17 PM

## 2024-09-13 NOTE — Progress Notes (Signed)
 Pt receives outpt HD at [facility] MWF. Navigator following to assist with any HD needs.  Suzen Satchel Dialysis Navigator [phone]

## 2024-09-13 NOTE — Plan of Care (Signed)
  Problem: Coping: Goal: Ability to adjust to condition or change in health will improve Outcome: Progressing   Problem: Health Behavior/Discharge Planning: Goal: Ability to identify and utilize available resources and services will improve Outcome: Progressing Goal: Ability to manage health-related needs will improve Outcome: Progressing   

## 2024-09-13 NOTE — Interval H&P Note (Signed)
 History and Physical Interval Note:  09/13/2024 9:29 AM  Sharon Arias  has presented today for surgery, with the diagnosis of PAD.  The various methods of treatment have been discussed with the patient and family. After consideration of risks, benefits and other options for treatment, the patient has consented to  Procedure(s): Lower Extremity Angiography (Right) as a surgical intervention.  The patient's history has been reviewed, patient examined, no change in status, stable for surgery.  I have reviewed the patient's chart and labs.  Questions were answered to the patient's satisfaction.     Cordella Shawl

## 2024-09-14 ENCOUNTER — Encounter
Admission: EM | Disposition: A | Discharge status: Home / Self Care | Payer: Self-pay | Source: Home / Self Care | Attending: Hospitalist

## 2024-09-14 ENCOUNTER — Encounter: Payer: Self-pay | Admitting: Internal Medicine

## 2024-09-14 ENCOUNTER — Inpatient Hospital Stay

## 2024-09-14 ENCOUNTER — Inpatient Hospital Stay: Admitting: Anesthesiology

## 2024-09-14 ENCOUNTER — Encounter: Admitting: Anesthesiology

## 2024-09-14 ENCOUNTER — Other Ambulatory Visit: Payer: Self-pay

## 2024-09-14 DIAGNOSIS — M86171 Other acute osteomyelitis, right ankle and foot: Secondary | ICD-10-CM | POA: Diagnosis not present

## 2024-09-14 DIAGNOSIS — L97514 Non-pressure chronic ulcer of other part of right foot with necrosis of bone: Secondary | ICD-10-CM | POA: Diagnosis not present

## 2024-09-14 DIAGNOSIS — E11621 Type 2 diabetes mellitus with foot ulcer: Secondary | ICD-10-CM | POA: Diagnosis not present

## 2024-09-14 HISTORY — PX: AMPUTATION TOE: SHX6595

## 2024-09-14 LAB — URINE CULTURE: Culture: 10000 — AB

## 2024-09-14 LAB — GLUCOSE, CAPILLARY
Glucose-Capillary: 69 mg/dL — ABNORMAL LOW (ref 70–99)
Glucose-Capillary: 114 mg/dL — ABNORMAL HIGH (ref 70–99)
Glucose-Capillary: 116 mg/dL — ABNORMAL HIGH (ref 70–99)
Glucose-Capillary: 190 mg/dL — ABNORMAL HIGH (ref 70–99)
Glucose-Capillary: 114 mg/dL — ABNORMAL HIGH (ref 70–99)
Glucose-Capillary: 174 mg/dL — ABNORMAL HIGH (ref 70–99)

## 2024-09-14 LAB — POTASSIUM: Potassium: 3.5 mmol/L (ref 3.5–5.1)

## 2024-09-14 SURGERY — AMPUTATION, TOE
Anesthesia: General | Site: Toe | Laterality: Right

## 2024-09-14 MED ORDER — OXYCODONE HCL 5 MG PO TABS: 5.0000 mg | ORAL_TABLET | Freq: Once | ORAL | Status: DC | PRN

## 2024-09-14 MED ORDER — PENTAFLUOROPROP-TETRAFLUOROETH EX AERO
1.0000 | INHALATION_SPRAY | CUTANEOUS | Status: DC | PRN
Start: 2024-09-14 — End: 2024-09-14

## 2024-09-14 MED ORDER — LIDOCAINE HCL (PF) 2 % IJ SOLN
INTRAMUSCULAR | Status: AC
  Filled 2024-09-14: qty 5

## 2024-09-14 MED ORDER — LIDOCAINE HCL (PF) 1 % IJ SOLN
INTRAMUSCULAR | Status: DC | PRN
  Administered 2024-09-14: 10 mL via SURGICAL_CAVITY

## 2024-09-14 MED ORDER — FENTANYL CITRATE (PF) 100 MCG/2ML IJ SOLN: 25.0000 ug | INTRAMUSCULAR | Status: DC | PRN

## 2024-09-14 MED ORDER — PROPOFOL 500 MG/50ML IV EMUL
INTRAVENOUS | Status: DC | PRN
  Administered 2024-09-14: 100 ug/kg/min via INTRAVENOUS

## 2024-09-14 MED ORDER — HEPARIN SODIUM (PORCINE) 1000 UNIT/ML DIALYSIS: 1000.0000 [IU] | INTRAMUSCULAR | Status: DC | PRN

## 2024-09-14 MED ORDER — DEXAMETHASONE SODIUM PHOSPHATE 10 MG/ML IJ SOLN
INTRAMUSCULAR | Status: DC | PRN
  Administered 2024-09-14: 5 mg via INTRAVENOUS

## 2024-09-14 MED ORDER — ONDANSETRON HCL 4 MG/2ML IJ SOLN
INTRAMUSCULAR | Status: DC | PRN
  Administered 2024-09-14: 4 mg via INTRAVENOUS

## 2024-09-14 MED ORDER — PHENYLEPHRINE 80 MCG/ML (10ML) SYRINGE FOR IV PUSH (FOR BLOOD PRESSURE SUPPORT)
PREFILLED_SYRINGE | INTRAVENOUS | Status: DC | PRN
  Administered 2024-09-14: 80 ug via INTRAVENOUS

## 2024-09-14 MED ORDER — ONDANSETRON HCL 4 MG/2ML IJ SOLN
INTRAMUSCULAR | Status: AC
  Filled 2024-09-14: qty 2

## 2024-09-14 MED ORDER — HEPARIN SODIUM (PORCINE) 1000 UNIT/ML DIALYSIS
1000.0000 [IU] | INTRAMUSCULAR | Status: DC | PRN
Start: 2024-09-14 — End: 2024-09-14

## 2024-09-14 MED ORDER — HYDROCODONE-ACETAMINOPHEN 5-325 MG PO TABS: 1.0000 | ORAL_TABLET | ORAL | Status: DC | PRN

## 2024-09-14 MED ORDER — EPHEDRINE 5 MG/ML INJ
INTRAVENOUS | Status: AC
Start: 2024-09-14 — End: 2024-09-14
  Filled 2024-09-14: qty 5

## 2024-09-14 MED ORDER — LIDOCAINE-PRILOCAINE 2.5-2.5 % EX CREA: 1.0000 | TOPICAL_CREAM | CUTANEOUS | Status: DC | PRN

## 2024-09-14 MED ORDER — OXYCODONE HCL 5 MG/5ML PO SOLN: 5.0000 mg | Freq: Once | ORAL | Status: DC | PRN

## 2024-09-14 MED ORDER — PHENYLEPHRINE 80 MCG/ML (10ML) SYRINGE FOR IV PUSH (FOR BLOOD PRESSURE SUPPORT)
PREFILLED_SYRINGE | INTRAVENOUS | Status: AC
  Filled 2024-09-14: qty 10

## 2024-09-14 MED ORDER — DEXAMETHASONE SODIUM PHOSPHATE 10 MG/ML IJ SOLN
INTRAMUSCULAR | Status: AC
  Filled 2024-09-14: qty 1

## 2024-09-14 MED ORDER — SODIUM CHLORIDE 0.9 % IV SOLN: INTRAVENOUS | Status: DC | PRN

## 2024-09-14 MED ORDER — LIDOCAINE HCL (CARDIAC) PF 100 MG/5ML IV SOSY
PREFILLED_SYRINGE | INTRAVENOUS | Status: DC | PRN
  Administered 2024-09-14: 40 mg via INTRAVENOUS

## 2024-09-14 MED ORDER — INSULIN GLARGINE 100 UNIT/ML ~~LOC~~ SOLN
60.0000 [IU] | Freq: Every day | SUBCUTANEOUS | Status: DC
  Administered 2024-09-14 – 2024-09-15 (×2): 60 [IU] via SUBCUTANEOUS
  Filled 2024-09-14 (×3): qty 0.6

## 2024-09-14 MED ORDER — DEXTROSE 50 % IV SOLN
0.5000 | Freq: Once | INTRAVENOUS | Status: AC
  Administered 2024-09-14: 25 mL via INTRAVENOUS

## 2024-09-14 MED ORDER — 0.9 % SODIUM CHLORIDE (POUR BTL) OPTIME
TOPICAL | Status: DC | PRN
  Administered 2024-09-14: 500 mL

## 2024-09-14 MED ORDER — PENTAFLUOROPROP-TETRAFLUOROETH EX AERO: 1.0000 | INHALATION_SPRAY | CUTANEOUS | Status: DC | PRN

## 2024-09-14 MED ORDER — LIDOCAINE HCL (PF) 1 % IJ SOLN
INTRAMUSCULAR | Status: AC
  Filled 2024-09-14: qty 30

## 2024-09-14 MED ORDER — BUPIVACAINE HCL (PF) 0.5 % IJ SOLN
INTRAMUSCULAR | Status: AC
  Filled 2024-09-14: qty 30

## 2024-09-14 MED ORDER — EPOETIN ALFA-EPBX 4000 UNIT/ML IJ SOLN
INTRAMUSCULAR | Status: AC
  Filled 2024-09-14: qty 1

## 2024-09-14 MED ORDER — EPHEDRINE SULFATE-NACL 50-0.9 MG/10ML-% IV SOSY
PREFILLED_SYRINGE | INTRAVENOUS | Status: DC | PRN
  Administered 2024-09-14: 5 mg via INTRAVENOUS

## 2024-09-14 MED ORDER — DEXTROSE 50 % IV SOLN
INTRAVENOUS | Status: AC
  Filled 2024-09-14: qty 50

## 2024-09-14 MED ORDER — PROPOFOL 1000 MG/100ML IV EMUL
INTRAVENOUS | Status: AC
  Filled 2024-09-14: qty 100

## 2024-09-14 SURGICAL SUPPLY — 49 items
BLADE MED AGGRESSIVE (BLADE) IMPLANT
BLADE OSC/SAGITTAL MD 5.5X18 (BLADE) IMPLANT
BLADE SURG 15 STRL LF DISP TIS (BLADE) ×2 IMPLANT
BLADE SURG MINI STRL (BLADE) IMPLANT
BNDG ELASTIC 4INX 5YD STR LF (GAUZE/BANDAGES/DRESSINGS) ×1 IMPLANT
BNDG ESMARCH 4X12 STRL LF (GAUZE/BANDAGES/DRESSINGS) IMPLANT
BNDG GAUZE DERMACEA FLUFF 4 (GAUZE/BANDAGES/DRESSINGS) ×1 IMPLANT
CNTNR URN SCR LID CUP LEK RST (MISCELLANEOUS) IMPLANT
CUFF TOURN SGL QUICK 12 (TOURNIQUET CUFF) IMPLANT
CUFF TOURN SGL QUICK 18X4 (TOURNIQUET CUFF) IMPLANT
DRAPE FLUOR MINI C-ARM 54X84 (DRAPES) IMPLANT
DRSG EMULSION OIL 3X3 NADH (GAUZE/BANDAGES/DRESSINGS) IMPLANT
DRSG TELFA 3X8 NADH STRL (GAUZE/BANDAGES/DRESSINGS) ×1 IMPLANT
DRSG XEROFORM 1X8 (GAUZE/BANDAGES/DRESSINGS) IMPLANT
DURAPREP 26ML APPLICATOR (WOUND CARE) IMPLANT
ELECTRODE REM PT RTRN 9FT ADLT (ELECTROSURGICAL) ×1 IMPLANT
GAUZE PAD ABD 8X10 STRL (GAUZE/BANDAGES/DRESSINGS) IMPLANT
GAUZE SPONGE 4X4 12PLY STRL (GAUZE/BANDAGES/DRESSINGS) ×1 IMPLANT
GAUZE STRETCH 2X75IN STRL (MISCELLANEOUS) IMPLANT
GAUZE XEROFORM 1X8 LF (GAUZE/BANDAGES/DRESSINGS) ×1 IMPLANT
GLOVE BIOGEL PI IND STRL 7.5 (GLOVE) ×1 IMPLANT
GLOVE SURG SYN 7.5 PF PI (GLOVE) ×1 IMPLANT
GOWN STRL REUS W/ TWL LRG LVL3 (GOWN DISPOSABLE) IMPLANT
GOWN STRL REUS W/ TWL XL LVL3 (GOWN DISPOSABLE) ×1 IMPLANT
HANDPIECE VERSAJET DEBRIDEMENT (MISCELLANEOUS) IMPLANT
KIT TURNOVER KIT A (KITS) ×1 IMPLANT
LABEL OR SOLS (LABEL) IMPLANT
MANIFOLD NEPTUNE II (INSTRUMENTS) ×1 IMPLANT
NDL FILTER BLUNT 18X1 1/2 (NEEDLE) IMPLANT
NDL HYPO 25X1 1.5 SAFETY (NEEDLE) ×1 IMPLANT
NEEDLE FILTER BLUNT 18X1 1/2 (NEEDLE) IMPLANT
NEEDLE HYPO 25X1 1.5 SAFETY (NEEDLE) ×1 IMPLANT
NS IRRIG 500ML POUR BTL (IV SOLUTION) ×1 IMPLANT
PACK EXTREMITY ARMC (MISCELLANEOUS) ×1 IMPLANT
PAD ABD DERMACEA PRESS 5X9 (GAUZE/BANDAGES/DRESSINGS) IMPLANT
PAD PREP OB/GYN DISP 24X41 (PERSONAL CARE ITEMS) ×1 IMPLANT
PENCIL SMOKE EVACUATOR (MISCELLANEOUS) ×1 IMPLANT
SOL .9 NS 3000ML IRR UROMATIC (IV SOLUTION) IMPLANT
SOLUTION PREP PVP 2OZ (MISCELLANEOUS) IMPLANT
STAPLER SKIN PROX 35W (STAPLE) IMPLANT
STOCKINETTE 48X4 2 PLY STRL (GAUZE/BANDAGES/DRESSINGS) ×1 IMPLANT
STOCKINETTE STRL 4IN 9604848 (GAUZE/BANDAGES/DRESSINGS) IMPLANT
SUT MNCRL AB 3-0 PS2 27 (SUTURE) IMPLANT
SUT PROLENE 3 0 PS 2 (SUTURE) ×1 IMPLANT
SWAB CULTURE AMIES ANAERIB BLU (MISCELLANEOUS) IMPLANT
SYR 10ML LL (SYRINGE) ×1 IMPLANT
TIP FAN IRRIG PULSAVAC PLUS (DISPOSABLE) IMPLANT
TRAP FLUID SMOKE EVACUATOR (MISCELLANEOUS) ×1 IMPLANT
WATER STERILE IRR 500ML POUR (IV SOLUTION) IMPLANT

## 2024-09-14 NOTE — Op Note (Signed)
 Full Operative Report  Date of Operation: 7:23 AM, 09/14/2024   Patient: Sharon Arias - 63 y.o. female  Surgeon: Malvin Marsa FALCON, DPM   Assistant: None  Diagnosis: osteomyelitis of right second toe  Procedure:  1.  Amputation of right second toe at MPJ level    Anesthesia: General  No responsible provider has been recorded for the case.  No anesthesia staff entered.   Estimated Blood Loss: Minimal   Hemostasis: 1) Anatomical dissection, mechanical compression, electrocautery 2) no tourniquet was used during procedure  Implants: * No implants in log *  Materials: Prolene 3-0  Injectables: 1) Pre-operatively: 10 cc of 50:50 mixture 1%lidocaine  plain and 0.5% marcaine  plain 2) Post-operatively: None   Specimens: - Pathology: Right second toe for pathology - Microbiology: Tissue culture right second toe   Antibiotics: IV antibiotics given per schedule on the floor  Drains: None  Complications: Patient tolerated the procedure well without complication.   Operative findings: As below in detailed report  Indications for Procedure: Sharon Arias presents to Malvin Marsa FALCON, DPM with a chief complaint of gangrene and concern for underlying osteomyelitis of the right second toe.  She is status post angiogram per vascular with intervention to optimize vascular status prior to amputation.  The patient has failed conservative treatments of various modalities. At this time the patient has elected to proceed with surgical correction. All alternatives, risks, and complications of the procedures were thoroughly explained to the patient. Patient exhibits appropriate understanding of all discussion points and informed consent was signed and obtained in the chart with no guarantees to surgical outcome given or implied.  Description of Procedure: Patient was brought to the operating room. Patient remained on their hospital bed in the supine position. A surgical timeout  was performed and all members of the operating room, the procedure, and the surgical site were identified. anesthesia occurred as per anesthesia record. Local anesthetic as previously described was then injected about the operative field in a local infiltrative block.  The operative lower extremity as noted above was then prepped and draped in the usual sterile manner. The following procedure then began.  Attention was directed to the 2nd digit on the RIGHT foot. A full-thickness incision encompassing the entire digit was made using a #15 blade. Dissection was carried down to bone. The toe was secured with a towel clamp, further dissected in its entirety, and disarticulated at the MPJ and passed to the back table as a gross specimen. This was then labled and sent to pathology. The bone was noted to be soft and eroded, and consistent with osteomyelitis.  A tissue culture was harvested from the toe with rongeur and sent for micro.  All remaining necrotic and devitalized soft tissue structures were visualized and dissected away using sharp and dull dissection. Care was taken to protect all neurovascular structures throughout the dissection. All bleeders were cauterized as necessary.  The area was then flushed with copious amounts of sterile saline. Then using the suture materials previously described, the site was closed in anatomic layers and the skin was well approximated under minimal tension.  The surgical site was then dressed with Xeroform 4 x 4 Kerlix Ace. The patient tolerated both the procedure and anesthesia well with vital signs stable throughout. The patient was transferred in good condition and all vital signs stable  from the OR to recovery under the discretion of anesthesia.  Condition: Vital signs stable, neurovascular status unchanged from preoperative   Surgical plan:  Likely clean margin though will need extended coverage doxycyline and Augmentin x 10 days due to purulence in soft tissues.  WBAT in post op shoe. Leave dressing c/d/I. Follow up next week Thursday or Friday in [place] office.   The patient will be weightbearing as tolerated in a postop shoe to the operative limb until further instructed. The dressing is to remain clean, dry, and intact. Will continue to follow unless noted elsewhere.   Marsa Honour, DPM [facility]

## 2024-09-14 NOTE — Anesthesia Postprocedure Evaluation (Signed)
 Anesthesia Post Note  Patient: Sharon Arias  Procedure(s) Performed: AMPUTATION, TOE (Right: Toe)  Patient location during evaluation: PACU Anesthesia Type: General Level of consciousness: awake and alert Pain management: pain level controlled Vital Signs Assessment: post-procedure vital signs reviewed and stable Respiratory status: spontaneous breathing, nonlabored ventilation and respiratory function stable Cardiovascular status: blood pressure returned to baseline and stable Postop Assessment: no apparent nausea or vomiting Anesthetic complications: no   No notable events documented.   Last Vitals:  Vitals:   09/14/24 0830 09/14/24 0845  BP: (!) 141/47 (!) 149/49  Pulse: 71 71  Resp: 15 19  Temp:    SpO2: 97% 97%    Last Pain:  Vitals:   09/14/24 0830  TempSrc:   PainSc: 0-No pain                 Fairy POUR [name]

## 2024-09-14 NOTE — Anesthesia Preprocedure Evaluation (Signed)
 Anesthesia Evaluation  Patient identified by MRN, date of birth, ID band Patient awake    Reviewed: Allergy & Precautions, NPO status , Patient's Chart, lab work & pertinent test results  History of Anesthesia Complications (+) PONV and history of anesthetic complications  Airway Mallampati: III  TM Distance: <3 FB Neck ROM: full    Dental  (+) Chipped   Pulmonary shortness of breath, sleep apnea    Pulmonary exam normal        Cardiovascular hypertension, + CAD and + Past MI  Normal cardiovascular exam+ Valvular Problems/Murmurs      Neuro/Psych  Neuromuscular disease CVA    GI/Hepatic negative GI ROS, Neg liver ROS,,,  Endo/Other  negative endocrine ROSdiabetes    Renal/GU Renal disease  negative genitourinary   Musculoskeletal   Abdominal   Peds  Hematology negative hematology ROS (+)   Anesthesia Other Findings Past Medical History: 05/19/2016: Anemia in chronic kidney disease No date: Anxiety No date: Aortic atherosclerosis No date: Blind No date: CAD (coronary artery disease) No date: Cardiac murmur No date: CHF (congestive heart failure) (HCC)     Comment:  G2DD on 02/2020 TTE No date: Chicken pox No date: Chronic anticoagulation No date: CVA (cerebral vascular accident) (HCC) No date: DDD (degenerative disc disease), lumbar No date: Detached retina No date: Diabetic retinopathy (HCC)     Comment:  legally blind No date: Diastolic congestive heart failure (HCC) No date: Dyspnea No date: ESRD needing dialysis (HCC) No date: Hx of CABG No date: Hyperlipidemia No date: Hypertension 06/2012: NSTEMI (non-ST elevated myocardial infarction) (HCC) No date: Obesity No date: Pneumonia No date: PONV (postoperative nausea and vomiting) No date: Sciatica of right side No date: Secondary hyperparathyroidism of renal origin No date: Sleep apnea     Comment:  CPAP NIGHTLY No date: Stage 5 chronic kidney  disease (HCC) No date: T2DM (type 2 diabetes mellitus) (HCC)  Past Surgical History: 04/21/2021: A/V FISTULAGRAM; Left     Comment:  Procedure: A/V FISTULAGRAM;  Surgeon: Jama Cordella MATSU, MD;  Location: [facility];  Service:               Cardiovascular;  Laterality: Left; 11/02/2022: A/V FISTULAGRAM; Left     Comment:  Procedure: A/V Fistulagram;  Surgeon: Jama Cordella MATSU, MD;  Location: [facility];  Service:               Cardiovascular;  Laterality: Left; 02/06/2021: AV FISTULA PLACEMENT; Left     Comment:  Procedure: ARTERIOVENOUS (AV) FISTULA CREATION (BRACHIAL              CEPHALIC );  Surgeon: Jama Cordella MATSU, MD;  Location:               [facility] ORS;  Service: Vascular;  Laterality: Left; 06/14/2024: COLONOSCOPY; N/A     Comment:  Procedure: COLONOSCOPY;  Surgeon: Onita Elspeth Sharper,              DO;  Location: [facility] ENDOSCOPY;  Service:               Gastroenterology;  Laterality: N/A;  IDDM  Patient on               Plavix  07/30/2024: COLONOSCOPY; N/A     Comment:  Procedure: COLONOSCOPY;  Surgeon: Onita Elspeth  Ozell,              DO;  Location: [facility] ENDOSCOPY;  Service:               Gastroenterology;  Laterality: N/A;  1st  AM case per               Onita 01/26/2016: COLONOSCOPY WITH PROPOFOL ; N/A     Comment:  Procedure: COLONOSCOPY WITH PROPOFOL ;  Surgeon: Gladis RAYMOND Mariner, MD;  Location: [facility] ENDOSCOPY;  Service:               Endoscopy;  Laterality: N/A; 01/27/2016: COLONOSCOPY WITH PROPOFOL ; N/A     Comment:  Procedure: COLONOSCOPY WITH PROPOFOL ;  Surgeon: Gladis RAYMOND Mariner, MD;  Location: [facility] ENDOSCOPY;  Service:               Endoscopy;  Laterality: N/A; 2013: CORONARY ARTERY BYPASS GRAFT 02/03/2021: DIALYSIS/PERMA CATHETER INSERTION; N/A     Comment:  Procedure: DIALYSIS/PERMA CATHETER INSERTION;  Surgeon:               Jama Cordella MATSU, MD;  Location: [facility];                Service: Cardiovascular;  Laterality: N/A; 06/16/2021: DIALYSIS/PERMA CATHETER INSERTION; N/A     Comment:  Procedure: DIALYSIS/PERMA CATHETER INSERTION;  Surgeon:               Marea Selinda RAMAN, MD;  Location: [facility];                Service: Cardiovascular;  Laterality: N/A; 09/22/2021: DIALYSIS/PERMA CATHETER REMOVAL; N/A     Comment:  Procedure: DIALYSIS/PERMA CATHETER REMOVAL;  Surgeon:               Jama Cordella MATSU, MD;  Location: [facility];               Service: Cardiovascular;  Laterality: N/A; No date: INCISION AND DRAINAGE     Comment:  chest abscess 12/11/2018: INCISION AND DRAINAGE ABSCESS; N/A     Comment:  Procedure: INCISION AND DRAINAGE PERINEAL;  Surgeon:               Jordis Laneta FALCON, MD;  Location: [facility] ORS;  Service:               General;  Laterality: N/A; 09/13/2024: LOWER EXTREMITY ANGIOGRAPHY; Right     Comment:  Procedure: Lower Extremity Angiography;  Surgeon:               Jama Cordella MATSU, MD;  Location: [facility];               Service: Cardiovascular;  Laterality: Right; 07/30/2024: POLYPECTOMY     Comment:  Procedure: POLYPECTOMY, INTESTINE;  Surgeon: Onita Elspeth Ozell, DO;  Location: [facility] ENDOSCOPY;  Service:               Gastroenterology;; No date: RETINAL LASER PROCEDURE  BMI    Body Mass Index: 39.02 kg/m      Reproductive/Obstetrics negative OB ROS  Anesthesia Physical Anesthesia Plan  ASA: 4  Anesthesia Plan: General   Post-op Pain Management:    Induction: Intravenous  PONV Risk Score and Plan: Propofol  infusion and TIVA  Airway Management Planned: Natural Airway and Nasal Cannula  Additional Equipment:   Intra-op Plan:   Post-operative Plan:   Informed Consent: I have reviewed the patients History and Physical, chart, labs and discussed the procedure including the risks, benefits and alternatives for the proposed  anesthesia with the patient or authorized representative who has indicated his/her understanding and acceptance.     Dental Advisory Given  Plan Discussed with: Anesthesiologist, CRNA and Surgeon  Anesthesia Plan Comments: (Patient consented for risks of anesthesia including but not limited to:  - adverse reactions to medications - risk of airway placement if required - damage to eyes, teeth, lips or other oral mucosa - nerve damage due to positioning  - sore throat or hoarseness - Damage to heart, brain, nerves, lungs, other parts of body or loss of life  Patient voiced understanding and assent.)        Anesthesia Quick Evaluation

## 2024-09-14 NOTE — Progress Notes (Signed)
  PROGRESS NOTE    Sharon Arias  FMW:[id number] DOB: [birth year] DOA: 09/10/2024 PCP: Auston Reyes BIRCH, MD  132A/132A-AA  LOS: 4 days   Brief hospital course:   Assessment & Plan: Sharon Arias 63 year old female with diabetes, chronic diabetic neuropathy, ESRD on HD, diabetic retinopathy with clinical blindness, diastolic congestive heart failure, CAD status post CABG, obesity, OSA on CPAP, who presents with right-sided foot pain.  Patient reports she had a small blister on the second toe of her foot that she noticed 3 days ago, she reports the swelling and blistering became worse so she presented to the ED.  She denies any fever or chills at home.  MRI of the foot confirms osteomyelitis, arterial Dopplers confirm significant PAD.  Podiatry and vascular surgery were consulted.    Osteomyelitis of right second toe Cellulitis --cont Unasyn  and Linezolid  --Amputation of right second toe at MPJ level today  PAD - Duplex arterial ultrasounds show significant stenosis.  Vascular surgery consulted --angiogram on 9/25 with angioplasty and stent placement right common superficial femoral artery and popliteal. --cont ASA, plavix  and statin   ESRD on hemodialysis MWF Hyperphos - iHD per nephro --cont Renvela    Type 2 diabetes on insulin  therapy - Most recent hemoglobin A1c 6.7 - Home dose 74 units of Tresiba  --reduce glargine to 60u nightly --ACHS and SSI   Hypertension --cont amlodipine , losartan  and lopressor    Heart failure with preserved EF, not currently in exacerbation - Clinically euvolemic at this time   CAD status post CABG History of CVA --cont ASA, plavix  and statin   Hypokalemia --supplement per nephro  Anemia of CKD - Stable.    DVT prophylaxis: Heparin  SQ Code Status: Full code  Family Communication:  Level of care: Med-Surg Dispo:   The patient is from: home Anticipated d/c is to: home Anticipated d/c date is: 1-2 days   Subjective and Interval  History:  Pt underwent Amputation of right second toe at MPJ level.  Had pain afterwards, but only wanted tylenol .   Objective: Vitals:   09/14/24 1600 09/14/24 1630 09/14/24 1641 09/14/24 1714  BP: (!) 154/51 (!) 142/50 (!) 149/52 (!) 149/52  Pulse: 64 72 68 73  Resp: 16 15 18 16   Temp:   97.8 F (36.6 C) 97.9 F (36.6 C)  TempSrc:   Oral Oral  SpO2: 99% 100% 98% 94%  Weight:      Height:        Intake/Output Summary (Last 24 hours) at 09/14/2024 1901 Last data filed at 09/14/2024 1641 Gross per 24 hour  Intake 150 ml  Output 2502 ml  Net -2352 ml   Filed Weights   09/10/24 1755 09/11/24 0048  Weight: 109.3 kg 113 kg    Examination:   Constitutional: NAD, AAOx3 HEENT: conjunctivae and lids normal, EOMI CV: No cyanosis.   RESP: normal respiratory effort, on RA Neuro: II - XII grossly intact.   Psych: Normal mood and affect.  Appropriate judgement and reason   Data Reviewed: I have personally reviewed labs and imaging studies  Time spent: 35 minutes  Ellouise Haber, MD [facility] If 7PM-7AM, please contact night-coverage 09/14/2024, 7:01 PM

## 2024-09-14 NOTE — Transfer of Care (Signed)
 Immediate Anesthesia Transfer of Care Note  Patient: Sharon Arias  Procedure(s) Performed: AMPUTATION, TOE (Right: Toe)  Patient Location: PACU  Anesthesia Type:General  Level of Consciousness: awake, alert , and oriented  Airway & Oxygen Therapy: Patient Spontanous Breathing and Patient connected to face mask oxygen  Post-op Assessment: Report given to RN and Post -op Vital signs reviewed and stable  Post vital signs: Reviewed and stable  Last Vitals:  Vitals Value Taken Time  BP 133/74 0807  Temp 35.9 0807  Pulse 73 09/14/24 08:09  Resp 19 09/14/24 08:09  SpO2 100 % 09/14/24 08:09  Vitals shown include unfiled device data.  Last Pain:  Vitals:   09/14/24 0713  TempSrc: Temporal  PainSc: 0-No pain         Complications: No notable events documented.

## 2024-09-14 NOTE — Progress Notes (Signed)
  Received patient in bed to unit.   Informed consent signed and in chart.    TX duration: 3.5     Transported back to floor  Hand-off given to patient's nurse. No acute distress noted    Access used: L AVF Access issues: none   Total UF removed: 2.5L Medication(s) given: retacrit  Post HD VS: wnl      Olivia Hurst LPN [facility]

## 2024-09-14 NOTE — Progress Notes (Signed)
 [facility]  ROUNDING NOTE   Subjective:   Sharon Arias is a 63 year old female with past medical conditions including hypertension, diabetes, CAD status post CABG, CHF, stroke, and end-stage renal disease on hemodialysis.  Patient presents to the emergency department with shortness of breath, right lower leg pain, and second right toe wound.  Patient has been admitted for Diabetic foot ulcer (HCC) [E11.621, L97.509] Cellulitis of right lower extremity [L03.115] Acute osteomyelitis of toe, right [facility]) [M86.171]  Patient is known to our practice and receives outpatient dialysis treatments at [facility] on a MWF schedule, supervised by Dr. Douglas.  Update: Patient seen and evaluated during dialysis   HEMODIALYSIS FLOWSHEET:  Blood Flow Rate (mL/min): 399 mL/min Arterial Pressure (mmHg): -158.58 mmHg Venous Pressure (mmHg): 143.63 mmHg TMP (mmHg): 26.26 mmHg Ultrafiltration Rate (mL/min): 916 mL/min Dialysate Flow Rate (mL/min): 299 ml/min  Post surgical procedure Does report RLE pain   Objective:  Vital signs in last 24 hours:  Temp:  [97.5 F (36.4 C)-99 F (37.2 C)] 98.5 F (36.9 C) (09/26 1252) Pulse Rate:  [60-80] 71 (09/26 1252) Resp:  [15-23] 19 (09/26 1252) BP: (141-167)/(44-88) 144/88 (09/26 1252) SpO2:  [95 %-100 %] 98 % (09/26 1252)  Weight change:  Filed Weights   09/10/24 1755 09/11/24 0048  Weight: 109.3 kg 113 kg    Intake/Output: I/O last 3 completed shifts: In: 240 [P.O.:240] Out: -    Intake/Output this shift:  Total I/O In: 150 [I.V.:150] Out: 2 [Blood:2]  Physical Exam: General: NAD  Head: Normocephalic, atraumatic. Moist oral mucosal membranes  Eyes: Anicteric  Lungs:  Clear to auscultation  Heart: Regular rate and rhythm  Abdomen:  Soft, nontender  Extremities: Trace peripheral edema.  Neurologic: Awake, alert, conversant  Skin: Warm,dry, right foot surgical dressing  Access: Left AVF    Basic Metabolic  Panel: Recent Labs  Lab 09/10/24 1749 09/11/24 0138 09/12/24 0257 09/13/24 0421 09/14/24 0552  NA 136 136 137 133*  --   K 2.9* 3.3* 3.3* 3.2* 3.5  CL 96* 98 100 95*  --   CO2 26 26 23 25   --   GLUCOSE 123* 161* 147* 257*  --   BUN 27* 31* 49* 45*  --   CREATININE 3.48* 3.80* 5.12* 4.77*  --   CALCIUM  8.7* 8.7* 8.9 8.7*  --   MG  --   --  2.1 2.3  --   PHOS  --   --  5.0* 5.2*  --     Liver Function Tests: Recent Labs  Lab 09/10/24 1749 09/12/24 0257 09/13/24 0421  AST 30 17 38  ALT 31 24 39  ALKPHOS 47 43 56  BILITOT 0.9 0.8 0.5  PROT 7.8 6.4* 6.6  ALBUMIN 3.4* 2.7* 2.8*   No results for input(s): LIPASE, AMYLASE in the last 168 hours. No results for input(s): AMMONIA in the last 168 hours.  CBC: Recent Labs  Lab 09/10/24 1749 09/11/24 0138 09/12/24 0257 09/13/24 0421  WBC 16.6* 18.0* 16.6* 16.8*  NEUTROABS 13.2*  --  13.3* 12.9*  HGB 10.1* 9.7* 8.1* 9.1*  HCT 30.5* 29.3* 24.5* 27.5*  MCV 91.0 91.8 91.4 91.1  PLT 321 307 253 303    Cardiac Enzymes: No results for input(s): CKTOTAL, CKMB, CKMBINDEX, TROPONINI in the last 168 hours.  BNP: Invalid input(s): POCBNP  CBG: Recent Labs  Lab 09/13/24 2247 09/14/24 0711 09/14/24 0810 09/14/24 0928 09/14/24 1158  GLUCAP 134* 69* 114* 116* 190*    Microbiology:  Results for orders placed or performed during the hospital encounter of 09/10/24  Culture, blood (Routine x 2)     Status: None (Preliminary result)   Collection Time: 09/10/24  5:49 PM   Specimen: BLOOD  Result Value Ref Range Status   Specimen Description BLOOD BLOOD RIGHT HAND  Final   Special Requests   Final    BOTTLES DRAWN AEROBIC AND ANAEROBIC Blood Culture results may not be optimal due to an inadequate volume of blood received in culture bottles   Culture   Final    NO GROWTH 4 DAYS Performed at [facility], [address] [place], [place] [address]    Report Status PENDING  Incomplete  Resp panel by  RT-PCR (RSV, Flu A&B, Covid) Anterior Nasal Swab     Status: None   Collection Time: 09/10/24  7:11 PM   Specimen: Anterior Nasal Swab  Result Value Ref Range Status   SARS Coronavirus 2 by RT PCR NEGATIVE NEGATIVE Final    Comment: (NOTE) SARS-CoV-2 target nucleic acids are NOT DETECTED.  The SARS-CoV-2 RNA is generally detectable in upper respiratory specimens during the acute phase of infection. The lowest concentration of SARS-CoV-2 viral copies this assay can detect is 138 copies/mL. A negative result does not preclude SARS-Cov-2 infection and should not be used as the sole basis for treatment or other patient management decisions. A negative result may occur with  improper specimen collection/handling, submission of specimen other than nasopharyngeal swab, presence of viral mutation(s) within the areas targeted by this assay, and inadequate number of viral copies(<138 copies/mL). A negative result must be combined with clinical observations, patient history, and epidemiological information. The expected result is Negative.  Fact Sheet for Patients:  BloggerCourse.com  Fact Sheet for Healthcare Providers:  SeriousBroker.it  This test is no t yet approved or cleared by the [place]  FDA and  has been authorized for detection and/or diagnosis of SARS-CoV-2 by FDA under an Emergency Use Authorization (EUA). This EUA will remain  in effect (meaning this test can be used) for the duration of the COVID-19 declaration under Section 564(b)(1) of the Act, 21 U.S.C.section 360bbb-3(b)(1), unless the authorization is terminated  or revoked sooner.       Influenza A by PCR NEGATIVE NEGATIVE Final   Influenza B by PCR NEGATIVE NEGATIVE Final    Comment: (NOTE) The Xpert Xpress SARS-CoV-2/FLU/RSV plus assay is intended as an aid in the diagnosis of influenza from Nasopharyngeal swab specimens and should not be used as a sole basis  for treatment. Nasal washings and aspirates are unacceptable for Xpert Xpress SARS-CoV-2/FLU/RSV testing.  Fact Sheet for Patients: BloggerCourse.com  Fact Sheet for Healthcare Providers: SeriousBroker.it  This test is not yet approved or cleared by the [place]  FDA and has been authorized for detection and/or diagnosis of SARS-CoV-2 by FDA under an Emergency Use Authorization (EUA). This EUA will remain in effect (meaning this test can be used) for the duration of the COVID-19 declaration under Section 564(b)(1) of the Act, 21 U.S.C. section 360bbb-3(b)(1), unless the authorization is terminated or revoked.     Resp Syncytial Virus by PCR NEGATIVE NEGATIVE Final    Comment: (NOTE) Fact Sheet for Patients: BloggerCourse.com  Fact Sheet for Healthcare Providers: SeriousBroker.it  This test is not yet approved or cleared by the [place]  FDA and has been authorized for detection and/or diagnosis of SARS-CoV-2 by FDA under an Emergency Use Authorization (EUA). This EUA will remain in effect (meaning this test can  be used) for the duration of the COVID-19 declaration under Section 564(b)(1) of the Act, 21 U.S.C. section 360bbb-3(b)(1), unless the authorization is terminated or revoked.  Performed at [facility], [address] [place], [place] [address]   Culture, blood (Routine x 2)     Status: None (Preliminary result)   Collection Time: 09/10/24  7:12 PM   Specimen: BLOOD  Result Value Ref Range Status   Specimen Description BLOOD RIGHT ANTECUBITAL  Final   Special Requests   Final    BOTTLES DRAWN AEROBIC AND ANAEROBIC Blood Culture results may not be optimal due to an inadequate volume of blood received in culture bottles   Culture   Final    NO GROWTH 4 DAYS Performed at [facility], [address] [place], [place] [address]    Report  Status PENDING  Incomplete  Urine Culture     Status: Abnormal   Collection Time: 09/10/24 11:25 PM   Specimen: Urine, Random  Result Value Ref Range Status   Specimen Description   Final    URINE, RANDOM Performed at [facility], [address] [place], [place] [address]    Special Requests   Final    NONE Reflexed from [phone] Performed at [facility], [address] [place], [place] [address]    Culture 10,000 COLONIES/mL STREPTOCOCCUS GALLOLYTICUS (A)  Final   Report Status 09/14/2024 FINAL  Final   Organism ID, Bacteria STREPTOCOCCUS GALLOLYTICUS (A)  Final      Susceptibility   Streptococcus gallolyticus - MIC*    PENICILLIN 0.12 SENSITIVE Sensitive     CEFTRIAXONE  0.25 SENSITIVE Sensitive     ERYTHROMYCIN >=8 RESISTANT Resistant     LEVOFLOXACIN  4 INTERMEDIATE Intermediate     VANCOMYCIN  0.5 SENSITIVE Sensitive     * 10,000 COLONIES/mL STREPTOCOCCUS GALLOLYTICUS    Coagulation Studies: No results for input(s): LABPROT, INR in the last 72 hours.   Urinalysis: No results for input(s): COLORURINE, LABSPEC, PHURINE, GLUCOSEU, HGBUR, BILIRUBINUR, KETONESUR, PROTEINUR, UROBILINOGEN, NITRITE, LEUKOCYTESUR in the last 72 hours.  Invalid input(s): APPERANCEUR     Imaging: DG MINI C-ARM IMAGE ONLY Result Date: 09/14/2024 There is no interpretation for this exam.  This order is for images obtained during a surgical procedure.  Please See Surgeries Tab for more information regarding the procedure.   PERIPHERAL VASCULAR CATHETERIZATION Result Date: 09/13/2024 See surgical note for result.    Medications:    sodium chloride      ampicillin -sulbactam (UNASYN ) IV 3 g (09/13/24 2235)   linezolid  (ZYVOX ) IV 600 mg (09/13/24 2324)    allopurinol   200 mg Oral Q breakfast   amLODipine   5 mg Oral QHS   vitamin C   500 mg Oral BID   aspirin  EC  81 mg Oral Q breakfast   Chlorhexidine  Gluconate Cloth  6 each Topical Q0600    clopidogrel   75 mg Oral Q breakfast   epoetin  alfa-epbx (RETACRIT ) injection  4,000 Units Intravenous Q M,W,F-1800   feeding supplement (NEPRO CARB STEADY)  237 mL Oral TID BM   heparin   5,000 Units Subcutaneous Q8H   insulin  aspart  0-6 Units Subcutaneous TID WC   insulin  glargine  60 Units Subcutaneous Q2200   losartan   100 mg Oral QHS   metoprolol  tartrate  50 mg Oral BID   multivitamin  1 tablet Oral QHS   rosuvastatin   40 mg Oral QHS   sevelamer  carbonate  800 mg Oral TID with meals   sodium chloride  flush  3 mL Intravenous Q12H   zinc  sulfate (50mg  elemental zinc )  220 mg Oral Daily   sodium chloride , acetaminophen , hydrALAZINE , hydrALAZINE , HYDROcodone -acetaminophen , ondansetron  **OR** ondansetron  (ZOFRAN ) IV, senna-docusate, sodium chloride  flush  Assessment/ Plan:  Sharon Arias is a 63 y.o.  female  with past medical conditions including hypertension, diabetes, CAD status post CABG, CHF, stroke, and end-stage renal disease on hemodialysis.  Patient presents to the emergency department with with shortness of breath, right lower leg pain, and second right toe wound.  Patient has been admitted for Diabetic foot ulcer (HCC) [Z88.378, L97.509] Cellulitis of right lower extremity [L03.115] Acute osteomyelitis of toe, right (HCC) [M86.171]   End stage renal disease on hemodialysis. Receiving dialysis today, UF 2.5L as tolerated. Next treatment scheduled for Monday.  2. Osteomyelitis, right second toe. Foot imaging concerning for osteomyelitis.. Vascular surgery performed angiogram on 9/25.  Podiatry performed amputation right second toe (MPJ) amputation on 9/26  3. Anemia of chronic kidney disease Lab Results  Component Value Date   HGB 9.1 (L) 09/13/2024    Hemoglobin 9.1.  Patient does receive Mircera at outpatient clinic. Continue ow dose retacrit  with dialysis.   4. Secondary Hyperparathyroidism: with outpatient labs: PTH 260, phosphorus 7.7, calcium  9.8 on 08/27/24.    Lab Results  Component Value Date   CALCIUM  8.7 (L) 09/13/2024   CAION 1.27 02/06/2021   PHOS 5.2 (H) 09/13/2024   Will continue to monitor bone minerals during this admission.    LOS: 4 Lauri Till 9/26/20251:09 PM .

## 2024-09-14 NOTE — Plan of Care (Signed)
  Problem: Education: Goal: Ability to describe self-care measures that may prevent or decrease complications (Diabetes Survival Skills Education) will improve Outcome: Progressing   Problem: Coping: Goal: Ability to adjust to condition or change in health will improve Outcome: Progressing

## 2024-09-14 NOTE — Inpatient Diabetes Management (Signed)
 Inpatient Diabetes Program Recommendations  AACE/ADA: New Consensus Statement on Inpatient Glycemic Control   Target Ranges:  Prepandial:   less than 140 mg/dL      Peak postprandial:   less than 180 mg/dL (1-2 hours)      Critically ill patients:  140 - 180 mg/dL    Latest Reference Range & Units 09/13/24 07:39 09/13/24 12:04 09/13/24 15:18 09/13/24 22:47 09/14/24 07:11  Glucose-Capillary 70 - 99 mg/dL 812 (H) 872 (H) 792 (H) 134 (H) 69 (L)   Review of Glycemic Control  Diabetes history: DM2 Outpatient Diabetes medications: Tresiba  74 units daily, Humalog  20 units TID with meals Current orders for Inpatient glycemic control: Lantus  65 units at bedtime, Novolog  0-9 units TID with meals  Inpatient Diabetes Program Recommendations:    Insulin : CBG 69 mg/dl this morning and noted patient NPO for surgery this morning. Please consider decreasing Lantus  to 60 units at bedtime.  Thanks, Earnie Gainer, RN, MSN, CDCES Diabetes Coordinator Inpatient Diabetes Program [phone] (Team Pager from 8am to 5pm)

## 2024-09-14 NOTE — Plan of Care (Signed)

## 2024-09-14 NOTE — Progress Notes (Signed)
 History and Physical Interval Note:  09/14/2024 6:58 AM  Sharon Arias  has presented today for surgery, with the diagnosis of gangrene and osteomyelitis of right second toe.  The various methods of treatment have been discussed with the patient and family. After consideration of risks, benefits and other options for treatment, the patient has consented to    RIGHT SECOND TOE AMPUTATION as a surgical intervention.  The patient's history has been reviewed, patient examined, no change in status, stable for surgery.  I have reviewed the patient's chart and labs.  Questions were answered to the patient's satisfaction.     Marsa FALCON Jalan Bodi

## 2024-09-15 ENCOUNTER — Inpatient Hospital Stay

## 2024-09-15 ENCOUNTER — Encounter: Payer: Self-pay | Admitting: Podiatry

## 2024-09-15 DIAGNOSIS — L97514 Non-pressure chronic ulcer of other part of right foot with necrosis of bone: Secondary | ICD-10-CM | POA: Diagnosis not present

## 2024-09-15 DIAGNOSIS — E11621 Type 2 diabetes mellitus with foot ulcer: Secondary | ICD-10-CM | POA: Diagnosis not present

## 2024-09-15 LAB — CBC
HCT: 28 % — ABNORMAL LOW (ref 36.0–46.0)
Hemoglobin: 9 g/dL — ABNORMAL LOW (ref 12.0–15.0)
MCH: 29.4 pg (ref 26.0–34.0)
MCHC: 32.1 g/dL (ref 30.0–36.0)
MCV: 91.5 fL (ref 80.0–100.0)
Platelets: 381 K/uL (ref 150–400)
RBC: 3.06 MIL/uL — ABNORMAL LOW (ref 3.87–5.11)
RDW: 14.2 % (ref 11.5–15.5)
WBC: 17.4 K/uL — ABNORMAL HIGH (ref 4.0–10.5)
nRBC: 0 % (ref 0.0–0.2)

## 2024-09-15 LAB — BASIC METABOLIC PANEL WITH GFR
Anion gap: 13 (ref 5–15)
BUN: 37 mg/dL — ABNORMAL HIGH (ref 8–23)
CO2: 28 mmol/L (ref 22–32)
Calcium: 9.3 mg/dL (ref 8.9–10.3)
Chloride: 100 mmol/L (ref 98–111)
Creatinine, Ser: 4.23 mg/dL — ABNORMAL HIGH (ref 0.44–1.00)
GFR, Estimated: 11 mL/min — ABNORMAL LOW (ref >60)
Glucose, Bld: 168 mg/dL — ABNORMAL HIGH (ref 70–99)
Potassium: 3.9 mmol/L (ref 3.5–5.1)
Sodium: 141 mmol/L (ref 135–145)

## 2024-09-15 LAB — CULTURE, BLOOD (ROUTINE X 2)
Culture: NO GROWTH
Culture: NO GROWTH

## 2024-09-15 LAB — GLUCOSE, CAPILLARY
Glucose-Capillary: 161 mg/dL — ABNORMAL HIGH (ref 70–99)
Glucose-Capillary: 196 mg/dL — ABNORMAL HIGH (ref 70–99)
Glucose-Capillary: 172 mg/dL — ABNORMAL HIGH (ref 70–99)
Glucose-Capillary: 283 mg/dL — ABNORMAL HIGH (ref 70–99)

## 2024-09-15 MED ORDER — LOPERAMIDE HCL 2 MG PO CAPS
2.0000 mg | ORAL_CAPSULE | Freq: Once | ORAL | Status: AC
  Administered 2024-09-15: 2 mg via ORAL
  Filled 2024-09-15: qty 1

## 2024-09-15 MED ORDER — LOPERAMIDE HCL 2 MG PO CAPS
2.0000 mg | ORAL_CAPSULE | Freq: Two times a day (BID) | ORAL | Status: DC | PRN
  Filled 2024-09-15: qty 1

## 2024-09-15 NOTE — Progress Notes (Signed)
   PODIATRY PROGRESS NOTE Patient Name: Sharon Arias  DOB 1961-11-27 DOA 09/10/2024  Hospital Day: 6  Assessment:  63 y.o. female with PMHx significant for  diabetes, chronic diabetic neuropathy, ESRD on HD, diabetic retinopathy with clinical blindness, diastolic congestive heart failure, CAD status post CABG, obesity, OSA on CPAP  with gas gangrene of right 2nd toe with osteomyelitis of the distal phalanx.   She is POD 1 status post right 2nd toe amputation on 9/26 with Dr. Malvin.  AF, VSS  Wound/Bone Cultures: Preliminary deep tissue cultures few Staph aureus, rare gram-negative rods  Imaging: XR 2 views right foot postop 9/26: Interval changes following right second toe amputation, small foci of subcutaneous emphysema seen at surgical site.  Plan:  - Findings reviewed with patient - Leave dressing intact until follow-up visit - Anticipate being able to de-escalate to p.o. antibiotics as appropriate, anticipate cleaning osseous surgical margins. -Do recommend 10 days of doxycycline and Augmentin from date of surgery due to purulence seen in soft tissues intraoperatively - May weight-bear as tolerated to the right foot using surgical shoe -Can order right lower extremity venous duplex to rule out DVT out of abundance of caution though she states that calf pain and swelling has improved since surgery and with antibiotics. - Please have patient follow up with [facility] [place] office once medically stable within one week of discharge.         Ethan LITTIE Saddler, DPM [facility]    Subjective:  Patient seen resting at bedside.  States pain to her foot is well-controlled.  Does report some upset stomach attributing this to antibiotics.  Dressings clean dry and intact.  Objective:   Vitals:   09/15/24 0343 09/15/24 0718  BP: (!) 134/44 (!) 143/52  Pulse: 61 61  Resp: 17 16  Temp: 98.7 F (37.1 C) 97.7 F (36.5 C)  SpO2: 93% 98%        Latest Ref Rng & Units 09/15/2024    7:15 AM 09/13/2024    4:21 AM 09/12/2024    2:57 AM  CBC  WBC 4.0 - 10.5 K/uL 17.4  16.8  16.6   Hemoglobin 12.0 - 15.0 g/dL 9.0  9.1  8.1   Hematocrit 36.0 - 46.0 % 28.0  27.5  24.5   Platelets 150 - 400 K/uL 381  303  253        Latest Ref Rng & Units 09/15/2024    7:15 AM 09/14/2024    5:52 AM 09/13/2024    4:21 AM  BMP  Glucose 70 - 99 mg/dL 831   742   BUN 8 - 23 mg/dL 37   45   Creatinine 9.55 - 1.00 mg/dL 5.76   5.22   Sodium 864 - 145 mmol/L 141   133   Potassium 3.5 - 5.1 mmol/L 3.9  3.5  3.2   Chloride 98 - 111 mmol/L 100   95   CO2 22 - 32 mmol/L 28   25   Calcium  8.9 - 10.3 mg/dL 9.3   8.7     General: AAOx3, NAD  Lower Extremity Exam  Right foot dressings left clean dry and intact.  No evidence of strikethrough seen.  Status post right second toe amputation.  Tenderness with right calf squeeze, she does state that this has improved with antibiotics and since surgery.   Radiology:  Results reviewed. See assessment for pertinent imaging results

## 2024-09-15 NOTE — Progress Notes (Signed)
 [facility]  PROGRESS NOTE   Subjective:   Patient seen at bedside.  Comfortable.  Denies any chest pain or shortness of breath.  Objective:  Vital signs: Blood pressure (!) 143/52, pulse 61, temperature 97.7 F (36.5 C), resp. rate 16, height 5' 7 (1.702 m), weight 113 kg, SpO2 98%.  Intake/Output Summary (Last 24 hours) at 09/15/2024 1352 Last data filed at 09/15/2024 0900 Gross per 24 hour  Intake 240 ml  Output 2500 ml  Net -2260 ml   Filed Weights   09/10/24 1755 09/11/24 0048  Weight: 109.3 kg 113 kg     Physical Exam: General:  No acute distress  Head:  Normocephalic, atraumatic. Moist oral mucosal membranes  Eyes:  Anicteric  Neck:  Supple  Lungs:   Clear to auscultation, normal effort  Heart:  S1S2 no rubs  Abdomen:   Soft, nontender, bowel sounds present  Extremities:  peripheral edema.  Neurologic:  Awake, alert, following commands  Skin:  No lesions  Access:     Basic Metabolic Panel: Recent Labs  Lab 09/10/24 1749 09/11/24 0138 09/12/24 0257 09/13/24 0421 09/14/24 0552 09/15/24 0715  NA 136 136 137 133*  --  141  K 2.9* 3.3* 3.3* 3.2* 3.5 3.9  CL 96* 98 100 95*  --  100  CO2 26 26 23 25   --  28  GLUCOSE 123* 161* 147* 257*  --  168*  BUN 27* 31* 49* 45*  --  37*  CREATININE 3.48* 3.80* 5.12* 4.77*  --  4.23*  CALCIUM  8.7* 8.7* 8.9 8.7*  --  9.3  MG  --   --  2.1 2.3  --   --   PHOS  --   --  5.0* 5.2*  --   --    GFR: Estimated Creatinine Clearance: 17.7 mL/min (A) (by C-G formula based on SCr of 4.23 mg/dL (H)).  Liver Function Tests: Recent Labs  Lab 09/10/24 1749 09/12/24 0257 09/13/24 0421  AST 30 17 38  ALT 31 24 39  ALKPHOS 47 43 56  BILITOT 0.9 0.8 0.5  PROT 7.8 6.4* 6.6  ALBUMIN 3.4* 2.7* 2.8*   No results for input(s): LIPASE, AMYLASE in the last 168 hours. No results for input(s): AMMONIA in the last 168 hours.  CBC: Recent Labs  Lab 09/10/24 1749 09/11/24 0138 09/12/24 0257 09/13/24 0421  09/15/24 0715  WBC 16.6* 18.0* 16.6* 16.8* 17.4*  NEUTROABS 13.2*  --  13.3* 12.9*  --   HGB 10.1* 9.7* 8.1* 9.1* 9.0*  HCT 30.5* 29.3* 24.5* 27.5* 28.0*  MCV 91.0 91.8 91.4 91.1 91.5  PLT 321 307 253 303 381     HbA1C: Hgb A1c MFr Bld  Date/Time Value Ref Range Status  07/05/2024 01:10 PM 6.7 (H) 4.8 - 5.6 % Final    Comment:    (NOTE) Diagnosis of Diabetes The following HbA1c ranges recommended by the American Diabetes Association (ADA) may be used as an aid in the diagnosis of diabetes mellitus.  Hemoglobin             Suggested A1C NGSP%              Diagnosis  <5.7                   Non Diabetic  5.7-6.4                Pre-Diabetic  >6.4  Diabetic  <7.0                   Glycemic control for                       adults with diabetes.    11/26/2023 12:28 PM 8.1 (H) 4.8 - 5.6 % Final    Comment:    (NOTE) Pre diabetes:          5.7%-6.4%  Diabetes:              >6.4%  Glycemic control for   <7.0% adults with diabetes     Urinalysis: No results for input(s): COLORURINE, LABSPEC, PHURINE, GLUCOSEU, HGBUR, BILIRUBINUR, KETONESUR, PROTEINUR, UROBILINOGEN, NITRITE, LEUKOCYTESUR in the last 72 hours.  Invalid input(s): APPERANCEUR    Imaging: DG Foot 2 Views Right Result Date: 09/14/2024 CLINICAL DATA:  Status post second toe amputation EXAM: RIGHT FOOT - 2 VIEW COMPARISON:  Right foot radiograph dated 09/10/2024 FINDINGS: Postsurgical changes of second toe amputation. Small foci of subcutaneous emphysema at the amputation site. Similar degenerative changes of the midfoot. Diffuse soft tissue swelling. IMPRESSION: Postsurgical changes of second toe amputation. Electronically Signed   By: Limin  Xu M.D.   On: 09/14/2024 14:50   DG MINI C-ARM IMAGE ONLY Result Date: 09/14/2024 There is no interpretation for this exam.  This order is for images obtained during a surgical procedure.  Please See Surgeries Tab for more  information regarding the procedure.     Medications:    ampicillin -sulbactam (UNASYN ) IV 3 g (09/15/24 1016)   linezolid  (ZYVOX ) IV 600 mg (09/15/24 1219)    allopurinol   200 mg Oral Q breakfast   amLODipine   5 mg Oral QHS   vitamin C   500 mg Oral BID   aspirin  EC  81 mg Oral Q breakfast   Chlorhexidine  Gluconate Cloth  6 each Topical Q0600   clopidogrel   75 mg Oral Q breakfast   epoetin  alfa-epbx (RETACRIT ) injection  4,000 Units Intravenous Q M,W,F-1800   feeding supplement (NEPRO CARB STEADY)  237 mL Oral TID BM   heparin   5,000 Units Subcutaneous Q8H   insulin  aspart  0-6 Units Subcutaneous TID WC   insulin  glargine  60 Units Subcutaneous Q2200   losartan   100 mg Oral QHS   metoprolol  tartrate  50 mg Oral BID   multivitamin  1 tablet Oral QHS   rosuvastatin   40 mg Oral QHS   sevelamer  carbonate  800 mg Oral TID with meals   sodium chloride  flush  3 mL Intravenous Q12H   zinc  sulfate (50mg  elemental zinc )  220 mg Oral Daily    Assessment/ Plan:     63 y.o. female with PMHx significant for  diabetes, chronic diabetic neuropathy, ESRD on HD, diabetic retinopathy with clinical blindness, diastolic congestive heart failure, CAD status post CABG, obesity, OSA on CPAP  with gas gangrene of right 2nd toe with osteomyelitis of the distal phalanx. She is POD 1 status post right 2nd toe amputation on 9/26 with Dr. Malvin.  #1: ESRD: Patient had dialysis yesterday via AV fistula and has tolerated well.  #2: Anemia: Continue anemia protocols.  She had been on Retacrit  at dialysis.  #3: Secondary hyperparathyroidism: Continue sevelamer  and calcium .  #4: Sepsis: Patient had to amputation.  She has been on ampicillin  and linezolid .  #5: Diabetes: Continue insulin  as per protocol.  #6: Hypertension: Continue metoprolol , losartan  and amlodipine .  Labs and medications reviewed. Will continue to  follow along with you.   LOS: 5 Orel Cooler, MD [facility] 9/27/20251:52 PM

## 2024-09-15 NOTE — Progress Notes (Signed)
  PROGRESS NOTE    Sharon Arias  FMW:[id number] DOB: [birth year] DOA: 09/10/2024 PCP: Auston Reyes BIRCH, MD  132A/132A-AA  LOS: 5 days   Brief hospital course:   Assessment & Plan: Sharon Arias 63 year old female with diabetes, chronic diabetic neuropathy, ESRD on HD, diabetic retinopathy with clinical blindness, diastolic congestive heart failure, CAD status post CABG, obesity, OSA on CPAP, who presents with right-sided foot pain.  Patient reports she had a small blister on the second toe of her foot that she noticed 3 days ago, she reports the swelling and blistering became worse so she presented to the ED.  She denies any fever or chills at home.  MRI of the foot confirms osteomyelitis, arterial Dopplers confirm significant PAD.  Podiatry and vascular surgery were consulted.    Osteomyelitis of right second toe Cellulitis --cont Unasyn  and Linezolid  --Amputation of right second toe at MPJ level on 9/26 --wound check per podiatry  PAD - Duplex arterial ultrasounds show significant stenosis.  Vascular surgery consulted --angiogram on 9/25 with angioplasty and stent placement right common superficial femoral artery and popliteal. --cont ASA, plavix  and statin   ESRD on hemodialysis [facility] Hyperphos - iHD per nephro --cont Renvela    Type 2 diabetes on insulin  therapy - Most recent hemoglobin A1c 6.7 - Home dose 74 units of Tresiba  --cont glargine at 60u nightly --ACHS and SSI   Hypertension --cont amlodipine , losartan  and lopressor    Heart failure with preserved EF, not currently in exacerbation - Clinically euvolemic at this time   CAD status post CABG History of CVA --cont ASA, plavix  and statin   Hypokalemia --supplement per nephro  Anemia of CKD - Stable.   Diarrhea --started after getting IV abx --Imodium PRN   DVT prophylaxis: Heparin  SQ Code Status: Full code  Family Communication:  Level of care: Med-Surg Dispo:   The patient is from:  home Anticipated d/c is to: home Anticipated d/c date is: 1-2 days   Subjective and Interval History:  Pt reported worsening diarrhea that started after IV abx.  Foot pain improved.   Objective: Vitals:   09/14/24 2048 09/15/24 0343 09/15/24 0718 09/15/24 1634  BP: (!) 154/54 (!) 134/44 (!) 143/52 (!) 157/61  Pulse: 73 61 61 66  Resp: 18 17 16 17   Temp: 98 F (36.7 C) 98.7 F (37.1 C) 97.7 F (36.5 C) 98.3 F (36.8 C)  TempSrc:  Oral  Oral  SpO2: 94% 93% 98% 96%  Weight:      Height:        Intake/Output Summary (Last 24 hours) at 09/15/2024 1850 Last data filed at 09/15/2024 1300 Gross per 24 hour  Intake 480 ml  Output --  Net 480 ml   Filed Weights   09/10/24 1755 09/11/24 0048  Weight: 109.3 kg 113 kg    Examination:   Constitutional: NAD, AAOx3 HEENT: conjunctivae and lids normal, EOMI CV: No cyanosis.   RESP: normal respiratory effort, on RA Neuro: II - XII grossly intact.   Psych: Normal mood and affect.  Appropriate judgement and reason   Data Reviewed: I have personally reviewed labs and imaging studies  Time spent: 50 minutes  Ellouise Haber, MD [facility] If 7PM-7AM, please contact night-coverage 09/15/2024, 6:50 PM

## 2024-09-15 NOTE — Plan of Care (Signed)

## 2024-09-16 ENCOUNTER — Other Ambulatory Visit: Payer: Self-pay

## 2024-09-16 LAB — BASIC METABOLIC PANEL WITH GFR
Anion gap: 19 — ABNORMAL HIGH (ref 5–15)
BUN: 52 mg/dL — ABNORMAL HIGH (ref 8–23)
CO2: 23 mmol/L (ref 22–32)
Calcium: 9.3 mg/dL (ref 8.9–10.3)
Chloride: 96 mmol/L — ABNORMAL LOW (ref 98–111)
Creatinine, Ser: 4.94 mg/dL — ABNORMAL HIGH (ref 0.44–1.00)
GFR, Estimated: 9 mL/min — ABNORMAL LOW (ref >60)
Glucose, Bld: 198 mg/dL — ABNORMAL HIGH (ref 70–99)
Potassium: 3.3 mmol/L — ABNORMAL LOW (ref 3.5–5.1)
Sodium: 138 mmol/L (ref 135–145)

## 2024-09-16 LAB — GLUCOSE, CAPILLARY
Glucose-Capillary: 176 mg/dL — ABNORMAL HIGH (ref 70–99)
Glucose-Capillary: 114 mg/dL — ABNORMAL HIGH (ref 70–99)

## 2024-09-16 LAB — CBC
HCT: 29.9 % — ABNORMAL LOW (ref 36.0–46.0)
Hemoglobin: 10 g/dL — ABNORMAL LOW (ref 12.0–15.0)
MCH: 30.8 pg (ref 26.0–34.0)
MCHC: 33.4 g/dL (ref 30.0–36.0)
MCV: 92 fL (ref 80.0–100.0)
Platelets: 429 K/uL — ABNORMAL HIGH (ref 150–400)
RBC: 3.25 MIL/uL — ABNORMAL LOW (ref 3.87–5.11)
RDW: 14.2 % (ref 11.5–15.5)
WBC: 17.8 K/uL — ABNORMAL HIGH (ref 4.0–10.5)
nRBC: 0 % (ref 0.0–0.2)

## 2024-09-16 MED ORDER — AMOXICILLIN-POT CLAVULANATE 500-125 MG PO TABS
1.0000 | ORAL_TABLET | Freq: Two times a day (BID) | ORAL | 0 refills | Status: AC
  Filled 2024-09-16: qty 20, 10d supply, fill #0

## 2024-09-16 MED ORDER — NEPRO/CARBSTEADY PO LIQD: 237.0000 mL | Freq: Three times a day (TID) | ORAL | Status: DC

## 2024-09-16 MED ORDER — TRESIBA FLEXTOUCH 200 UNIT/ML ~~LOC~~ SOPN: 60.0000 [IU] | PEN_INJECTOR | Freq: Every day | SUBCUTANEOUS | Status: DC

## 2024-09-16 MED ORDER — AMOXICILLIN-POT CLAVULANATE 875-125 MG PO TABS: 1.0000 | ORAL_TABLET | Freq: Two times a day (BID) | ORAL | Status: DC

## 2024-09-16 MED ORDER — DOXYCYCLINE HYCLATE 100 MG PO TABS
100.0000 mg | ORAL_TABLET | Freq: Two times a day (BID) | ORAL | 0 refills | Status: AC
  Filled 2024-09-16: qty 20, 10d supply, fill #0

## 2024-09-16 MED ORDER — EPOETIN ALFA-EPBX 10000 UNIT/ML IJ SOLN: 4000.0000 [IU] | INTRAMUSCULAR | Status: DC

## 2024-09-16 MED ORDER — DOXYCYCLINE HYCLATE 100 MG PO TABS
100.0000 mg | ORAL_TABLET | Freq: Two times a day (BID) | ORAL | Status: DC
  Administered 2024-09-16: 100 mg via ORAL
  Filled 2024-09-16: qty 1

## 2024-09-16 MED ORDER — AMOXICILLIN-POT CLAVULANATE 500-125 MG PO TABS
1.0000 | ORAL_TABLET | Freq: Two times a day (BID) | ORAL | Status: DC
  Administered 2024-09-16: 1 via ORAL
  Filled 2024-09-16: qty 1

## 2024-09-16 MED ORDER — ZINC SULFATE 220 (50 ZN) MG PO CAPS: 220.0000 mg | ORAL_CAPSULE | Freq: Every day | ORAL | Status: DC

## 2024-09-16 MED ORDER — ASCORBIC ACID 500 MG PO TABS: 500.0000 mg | ORAL_TABLET | Freq: Two times a day (BID) | ORAL | Status: DC

## 2024-09-16 NOTE — Progress Notes (Signed)
 PHARMACY NOTE:  ANTIMICROBIAL RENAL DOSAGE ADJUSTMENT  Current antimicrobial regimen includes a mismatch between antimicrobial dosage and estimated renal function.  As per policy approved by the Pharmacy & Therapeutics and Medical Executive Committees, the antimicrobial dosage will be adjusted accordingly.  Current antimicrobial dosage:  Augmentin 875-125mg  PO q12H  Indication: DFI  Renal Function:  Estimated Creatinine Clearance: 15.1 mL/min (A) (by C-G formula based on SCr of 4.94 mg/dL (H)).    Antimicrobial dosage has been changed to:  Augmentin 500-125mg  PO q12H   Thank you for allowing pharmacy to be a part of this patient's care.  Will M. Lenon, PharmD, BCPS Clinical Pharmacist 09/16/2024 12:08 PM

## 2024-09-16 NOTE — Discharge Summary (Signed)
 Physician Discharge Summary   Sharon Arias  female DOB: 1961-08-02  FMW:[id number]  PCP: Auston Reyes BIRCH, MD  Admit date: 09/10/2024 Discharge date: 09/16/2024  Admitted From: home Disposition:  home CODE STATUS: Full code  Discharge Instructions     Diet Carb Modified   Complete by: As directed    Discharge instructions   Complete by: As directed    - Leave dressing intact until follow-up visit with podiatry 1 week after discharge.  - please take 10 days of oral antibiotics doxycycline and Augmentin   - May weight-bear as tolerated to the right foot using surgical shoe  --your Tresiba  has been reduced from 75 units to 60 units daily. - -   No wound care   Complete by: As directed       Hospital Course:  For full details, please see H&P, progress notes, consult notes and ancillary notes.  Briefly,  Sharon Arias 63 year old female with diabetes, chronic diabetic neuropathy, ESRD on HD, diabetic retinopathy with clinical blindness, diastolic congestive heart failure, CAD status post CABG, obesity, OSA on CPAP, who presented with right-sided foot pain.    MRI of the foot confirmed osteomyelitis, arterial Dopplers confirmed significant PAD.  Podiatry and vascular surgery were consulted.    Osteomyelitis of right second toe Cellulitis S/p Amputation of right second toe at MPJ level on 09/14/24 --started on broad-spectrum abx on presentation f/b 5 days of Unasyn  and Linezolid .  Discharged on 10 more days of doxy and Augmentin, per podiatry rec. - Leave dressing intact until follow-up visit with podiatry 1 week after discharge.  - May weight-bear as tolerated to the right foot using surgical shoe   PAD - Duplex arterial ultrasounds show significant stenosis.  Vascular surgery consulted --underwent angiogram on 09/13/24 with angioplasty and stent placement right common superficial femoral artery and popliteal. --cont ASA, plavix  and statin   ESRD on hemodialysis  MWF Hyperphos - iHD per nephro --cont Renvela    Type 2 diabetes on insulin  therapy - Most recent hemoglobin A1c 6.7 --Home Tresiba  reduced from 75 units to 60 units daily.    Hypertension --cont home regimen as below   Heart failure with preserved EF, not currently in exacerbation - Clinically euvolemic at this time   CAD status post CABG History of CVA --cont ASA, plavix  and statin   Hypokalemia --management per nephro   Anemia of CKD - Stable.    Diarrhea --started after getting IV abx --Imodium PRN    Discharge Diagnoses:  Principal Problem:   Diabetic foot ulcer (HCC) Active Problems:   Acute osteomyelitis of toe, right (HCC)   Cellulitis of right lower extremity   30 Day Unplanned Readmission Risk Score    Flowsheet Row ED to Hosp-Admission (Current) from 09/10/2024 in [facility] (1A)  30 Day Unplanned Readmission Risk Score (%) 32.29 Filed at 09/16/2024 1200    This score is the patient's risk of an unplanned readmission within 30 days of being discharged (0 -100%). The score is based on dignosis, age, lab data, medications, orders, and past utilization.   Low:  0-14.9   Medium: 15-21.9   High: 22-29.9   Extreme: 30 and above         Discharge Instructions:  Allergies as of 09/16/2024       Reactions   Ozempic (0.25 Or 0.5 Mg-dose) [semaglutide(0.25 Or 0.5mg -dos)] Diarrhea, Nausea Only   Trulicity [dulaglutide] Diarrhea, Nausea Only   Other    Anesthesia--nausea/vomiting  Medication List     TAKE these medications    acetaminophen  325 MG tablet Commonly known as: TYLENOL  Take 2 tablets (650 mg total) by mouth every 4 (four) hours as needed for mild pain (pain score 1-3) (or temp > 37.5 C (99.5 F)).   allopurinol  300 MG tablet Commonly known as: ZYLOPRIM  Take 1 tablet (300 mg total) by mouth in the morning.   amLODipine  5 MG tablet Commonly known as: NORVASC  Take 1 tablet (5 mg total) by mouth at  bedtime.   amoxicillin -clavulanate 500-125 MG tablet Commonly known as: AUGMENTIN Take 1 tablet by mouth 2 (two) times daily for 10 days.   ascorbic acid  500 MG tablet Commonly known as: VITAMIN C  Take 1 tablet (500 mg total) by mouth 2 (two) times daily.   aspirin  EC 81 MG tablet Take 81 mg by mouth in the morning.   clopidogrel  75 MG tablet Commonly known as: PLAVIX  Take 1 tablet (75 mg total) by mouth daily.   doxycycline 100 MG tablet Commonly known as: VIBRA-TABS Take 1 tablet (100 mg total) by mouth every 12 (twelve) hours for 10 days.   epoetin  alfa-epbx 10000 UNIT/ML injection Commonly known as: RETACRIT  Inject 0.4 mLs (4,000 Units total) into the vein every Monday, Wednesday, and Friday at 6 PM. Start taking on: September 17, 2024   feeding supplement (NEPRO CARB STEADY) Liqd Take 237 mLs by mouth 3 (three) times daily between meals.   furosemide  40 MG tablet Commonly known as: LASIX  Take 1 tablet (40 mg total) by mouth daily as needed for edema.   hydrALAZINE  50 MG tablet Commonly known as: APRESOLINE  Take 1 tablet (50 mg total) by mouth in the morning and at bedtime.   insulin  lispro 100 UNIT/ML KwikPen Commonly known as: HUMALOG  Inject 20 Units into the skin 3 (three) times daily with meals.   losartan  100 MG tablet Commonly known as: COZAAR  Take 1 tablet (100 mg total) by mouth at bedtime.   metoprolol  tartrate 50 MG tablet Commonly known as: LOPRESSOR  Take 1 tablet (50 mg total) by mouth 2 (two) times daily.   multivitamin Tabs tablet Take 1 tablet by mouth at bedtime.   rosuvastatin  40 MG tablet Commonly known as: CRESTOR  Take 40 mg by mouth.  Take 40 mg by mouth.   sevelamer  carbonate 800 MG tablet Commonly known as: RENVELA  Take 1 tablet (800 mg total) by mouth 3 (three) times daily.   Tresiba  FlexTouch 200 UNIT/ML FlexTouch Pen Generic drug: insulin  degludec Inject 60 Units into the skin daily. Reduced from 75 units. What changed:  how  much to take how to take this when to take this additional instructions   zinc  sulfate (50mg  elemental zinc ) 220 (50 Zn) MG capsule Take 1 capsule (220 mg total) by mouth daily. Start taking on: September 17, 2024         Follow-up Information     Janit Thresa HERO, DPM Follow up in 1 week(s).   Specialty: Podiatry Contact information: [address] [place] [address] [phone]                 Allergies  Allergen Reactions   Ozempic (0.25 Or 0.5 Mg-Dose) [Semaglutide(0.25 Or 0.5mg -Dos)] Diarrhea and Nausea Only   Trulicity [Dulaglutide] Diarrhea and Nausea Only   Other     Anesthesia--nausea/vomiting     The results of significant diagnostics from this hospitalization (including imaging, microbiology, ancillary and laboratory) are listed below for reference.   Consultations:   Procedures/Studies: US   Venous Img Lower Unilateral Right (DVT) Result Date: 09/15/2024 CLINICAL DATA:  Right calf pain. EXAM: RIGHT LOWER EXTREMITY VENOUS DOPPLER ULTRASOUND TECHNIQUE: Gray-scale sonography with graded compression, as well as color Doppler and duplex ultrasound were performed to evaluate the lower extremity deep venous systems from the level of the common femoral vein and including the common femoral, femoral, profunda femoral, popliteal and calf veins including the posterior tibial, peroneal and gastrocnemius veins when visible. The superficial great saphenous vein was also interrogated. Spectral Doppler was utilized to evaluate flow at rest and with distal augmentation maneuvers in the common femoral, femoral and popliteal veins. COMPARISON:  None Available. FINDINGS: Contralateral Common Femoral Vein: Respiratory phasicity is normal and symmetric with the symptomatic side. No evidence of thrombus. Normal compressibility. Common Femoral Vein: No evidence of thrombus. Normal compressibility, respiratory phasicity and response to augmentation. Saphenofemoral Junction: No  evidence of thrombus. Normal compressibility and flow on color Doppler imaging. Profunda Femoral Vein: No evidence of thrombus. Normal compressibility and flow on color Doppler imaging. Femoral Vein: No evidence of thrombus. Normal compressibility, respiratory phasicity and response to augmentation. Popliteal Vein: No evidence of thrombus. Normal compressibility, respiratory phasicity and response to augmentation. Calf Veins: No evidence of thrombus. Normal compressibility and flow on color Doppler imaging. Superficial Great Saphenous Vein: No evidence of thrombus. Normal compressibility. Venous Reflux:  None. Other Findings: No evidence of superficial thrombophlebitis or abnormal fluid collection. IMPRESSION: No evidence of right lower extremity deep venous thrombosis. Electronically Signed   By: Marcey Moan M.D.   On: 09/15/2024 15:49   DG Foot 2 Views Right Result Date: 09/14/2024 CLINICAL DATA:  Status post second toe amputation EXAM: RIGHT FOOT - 2 VIEW COMPARISON:  Right foot radiograph dated 09/10/2024 FINDINGS: Postsurgical changes of second toe amputation. Small foci of subcutaneous emphysema at the amputation site. Similar degenerative changes of the midfoot. Diffuse soft tissue swelling. IMPRESSION: Postsurgical changes of second toe amputation. Electronically Signed   By: Limin  Xu M.D.   On: 09/14/2024 14:50   DG MINI C-ARM IMAGE ONLY Result Date: 09/14/2024 There is no interpretation for this exam.  This order is for images obtained during a surgical procedure.  Please See Surgeries Tab for more information regarding the procedure.   PERIPHERAL VASCULAR CATHETERIZATION Result Date: 09/13/2024 See surgical note for result.  US  ARTERIAL LOWER EXTREMITY DUPLEX BILATERAL Result Date: 09/11/2024 CLINICAL DATA:  RIGHT lower extremity osteomyelitis. Diabetic foot ulcer. Hypertension. Hyperlipidemia. Diabetes. Peripheral vascular disease. EXAM: BILATERAL LOWER EXTREMITY ARTERIAL DUPLEX SCAN  TECHNIQUE: Gray-scale sonography as well as color Doppler and duplex ultrasound was performed to evaluate the arteries of both lower extremities including the common, superficial and profunda femoral arteries, popliteal artery and calf arteries. COMPARISON:  Ultrasound ABI 11/20/2019 FINDINGS: Right Lower Extremity Inflow: Normal common femoral arterial waveforms and velocities. Moderate amount of calcified atheromatous plaque seen in the RIGHT common femoral artery. Outflow: RIGHT profunda femoris artery is patent with triphasic waveform. Atheromatous plaque seen throughout the RIGHT superficial femoral artery. Waveform within the proximal RIGHT superficial femoral artery is triphasic. Waveform in the mid and distal RIGHT superficial femoral artery are monophasic. RIGHT popliteal artery waveform is monophasic. No focal elevation of peak systolic velocity. Runoff: Anterior and posterior tibial arteries are diffusely calcified with monophasic waveforms. Left Lower Extremity Inflow: Atherosclerotic plaque seen throughout the LEFT common femoral artery. LEFT common femoral artery waveform is triphasic. Outflow: Profunda femoris artery is patent with multiphasic waveform. Atheromatous plaque seen throughout the LEFT superficial femoral artery with  monophasic waveforms seen throughout. Monophasic waveforms seen throughout the LEFT popliteal artery. No focal elevation of peak systolic velocity identified. Runoff: LEFT anterior and posterior tibial arteries are diffusely calcified with monophasic waveforms. IMPRESSION: 1. Monophasic waveforms seen in the RIGHT mid to distal SFA, popliteal, anterior, and posterior tibial arteries indicative of stenosis at the junction of the proximal and mid 1/3 of the SFA. 2. Monophasic waveforms seen in the LEFT SFA, popliteal, anterior, and posterior tibial arteries indicative of stenosis at the level of the proximal SFA. Electronically Signed   By: Aliene Lloyd M.D.   On: 09/11/2024  12:29   MR FOOT RIGHT WO CONTRAST Result Date: 09/11/2024 CLINICAL DATA:  Foot pain with swelling, blistering on the second toe EXAM: MRI OF THE RIGHT FOREFOOT WITHOUT CONTRAST TECHNIQUE: Multiplanar, multisequence MR imaging of the right foot from the Lisfranc joint through the toes was performed. No intravenous contrast was administered. COMPARISON:  Radiographs 09/10/2024 FINDINGS: Bones/Joint/Cartilage Osteomyelitis of the distal phalanx second toe with eroded tuft and low-grade edema within and surrounding the remaining distal phalanx. Mild degenerative arthropathy at the midfoot and between the bases of the third and fourth metatarsals. Bifid medial first digit sesamoid. Ligaments The Lisfranc ligament appears grossly intact. Muscles and Tendons Regional muscular atrophy. Soft tissues Ulceration distally along the second toe. Cutaneous thickening in the second toe along with subcutaneous edema favoring cellulitis. Dorsal subcutaneous edema in the forefoot. No drainable abscess. IMPRESSION: 1. Osteomyelitis of the distal phalanx second toe with eroded tuft and low-grade edema within and surrounding the remaining distal phalanx. 2. Ulceration distally along the second toe with cellulitis. No drainable abscess. 3. Mild degenerative arthropathy at the midfoot and between the bases of the third and fourth metatarsals. 4. Regional muscular atrophy. Electronically Signed   By: Ryan Salvage M.D.   On: 09/11/2024 11:34   DG Foot Complete Right Result Date: 09/10/2024 CLINICAL DATA:  Wound infection with possible osteomyelitis. Shortness of breath. EXAM: RIGHT FOOT COMPLETE - 3+ VIEW COMPARISON:  None Available. FINDINGS: Soft tissue defect and soft tissue gas in the distal right second toe with underlying tuft erosions suggesting osteomyelitis with skin infection. No radiopaque foreign bodies are identified. A degenerative changes demonstrated in the interphalangeal joints and intertarsal joints. Small  calcaneal spurs. No evidence of acute fracture or dislocation. Vascular calcifications. IMPRESSION: Soft tissue defect and soft tissue gas in the distal right second toe with underlying erosion of the distal phalangeal tuft suggesting osteomyelitis. Electronically Signed   By: Elsie Gravely M.D.   On: 09/10/2024 18:37   DG Chest 2 View if patient is not in a treatment room. Result Date: 09/10/2024 CLINICAL DATA:  Wound infection with possible osteomyelitis. Shortness of breath. EXAM: CHEST - 2 VIEW COMPARISON:  07/05/2024 FINDINGS: Postoperative changes in the mediastinum. Shallow inspiration. Cardiac enlargement with mild pulmonary vascular congestion and perihilar edema, improving since previous study. Minimal bilateral pleural effusions are also improved. No pneumothorax. Mediastinal contours appear intact. Calcification of the aorta. Degenerative changes in the spine and shoulders. IMPRESSION: Cardiac enlargement with mild pulmonary vascular congestion and mild perihilar edema demonstrating improvement since previous study. Minimal pleural effusions. Electronically Signed   By: Elsie Gravely M.D.   On: 09/10/2024 18:35      Labs: BNP (last 3 results) Recent Labs    07/05/24 0940  BNP 873.4*   Basic Metabolic Panel: Recent Labs  Lab 09/11/24 0138 09/12/24 0257 09/13/24 0421 09/14/24 0552 09/15/24 0715 09/16/24 0537  NA 136 137 133*  --  141 138  K 3.3* 3.3* 3.2* 3.5 3.9 3.3*  CL 98 100 95*  --  100 96*  CO2 26 23 25   --  28 23  GLUCOSE 161* 147* 257*  --  168* 198*  BUN 31* 49* 45*  --  37* 52*  CREATININE 3.80* 5.12* 4.77*  --  4.23* 4.94*  CALCIUM  8.7* 8.9 8.7*  --  9.3 9.3  MG  --  2.1 2.3  --   --   --   PHOS  --  5.0* 5.2*  --   --   --    Liver Function Tests: Recent Labs  Lab 09/10/24 1749 09/12/24 0257 09/13/24 0421  AST 30 17 38  ALT 31 24 39  ALKPHOS 47 43 56  BILITOT 0.9 0.8 0.5  PROT 7.8 6.4* 6.6  ALBUMIN 3.4* 2.7* 2.8*   No results for input(s):  LIPASE, AMYLASE in the last 168 hours. No results for input(s): AMMONIA in the last 168 hours. CBC: Recent Labs  Lab 09/10/24 1749 09/11/24 0138 09/12/24 0257 09/13/24 0421 09/15/24 0715 09/16/24 0537  WBC 16.6* 18.0* 16.6* 16.8* 17.4* 17.8*  NEUTROABS 13.2*  --  13.3* 12.9*  --   --   HGB 10.1* 9.7* 8.1* 9.1* 9.0* 10.0*  HCT 30.5* 29.3* 24.5* 27.5* 28.0* 29.9*  MCV 91.0 91.8 91.4 91.1 91.5 92.0  PLT 321 307 253 303 381 429*   Cardiac Enzymes: No results for input(s): CKTOTAL, CKMB, CKMBINDEX, TROPONINI in the last 168 hours. BNP: Invalid input(s): POCBNP CBG: Recent Labs  Lab 09/15/24 1131 09/15/24 1635 09/15/24 2307 09/16/24 0743 09/16/24 1145  GLUCAP 196* 172* 283* 176* 114*   D-Dimer No results for input(s): DDIMER in the last 72 hours. Hgb A1c No results for input(s): HGBA1C in the last 72 hours. Lipid Profile No results for input(s): CHOL, HDL, LDLCALC, TRIG, CHOLHDL, LDLDIRECT in the last 72 hours. Thyroid  function studies No results for input(s): TSH, T4TOTAL, T3FREE, THYROIDAB in the last 72 hours.  Invalid input(s): FREET3 Anemia work up No results for input(s): VITAMINB12, FOLATE, FERRITIN, TIBC, IRON, RETICCTPCT in the last 72 hours. Urinalysis    Component Value Date/Time   COLORURINE YELLOW (A) 09/10/2024 2325   APPEARANCEUR HAZY (A) 09/10/2024 2325   LABSPEC 1.013 09/10/2024 2325   PHURINE 8.0 09/10/2024 2325   GLUCOSEU >=500 (A) 09/10/2024 2325   HGBUR NEGATIVE 09/10/2024 2325   BILIRUBINUR NEGATIVE 09/10/2024 2325   KETONESUR NEGATIVE 09/10/2024 2325   PROTEINUR >=300 (A) 09/10/2024 2325   NITRITE NEGATIVE 09/10/2024 2325   LEUKOCYTESUR NEGATIVE 09/10/2024 2325   Sepsis Labs Recent Labs  Lab 09/12/24 0257 09/13/24 0421 09/15/24 0715 09/16/24 0537  WBC 16.6* 16.8* 17.4* 17.8*   Microbiology Recent Results (from the past 240 hours)  Culture, blood (Routine x 2)     Status: None    Collection Time: 09/10/24  5:49 PM   Specimen: BLOOD  Result Value Ref Range Status   Specimen Description BLOOD BLOOD RIGHT HAND  Final   Special Requests   Final    BOTTLES DRAWN AEROBIC AND ANAEROBIC Blood Culture results may not be optimal due to an inadequate volume of blood received in culture bottles   Culture   Final    NO GROWTH 5 DAYS Performed at [facility], [address] [place], [place] [address]    Report Status 09/15/2024 FINAL  Final  Resp panel by RT-PCR (RSV, Flu A&B, Covid) Anterior Nasal Swab     Status: None  Collection Time: 09/10/24  7:11 PM   Specimen: Anterior Nasal Swab  Result Value Ref Range Status   SARS Coronavirus 2 by RT PCR NEGATIVE NEGATIVE Final    Comment: (NOTE) SARS-CoV-2 target nucleic acids are NOT DETECTED.  The SARS-CoV-2 RNA is generally detectable in upper respiratory specimens during the acute phase of infection. The lowest concentration of SARS-CoV-2 viral copies this assay can detect is 138 copies/mL. A negative result does not preclude SARS-Cov-2 infection and should not be used as the sole basis for treatment or other patient management decisions. A negative result may occur with  improper specimen collection/handling, submission of specimen other than nasopharyngeal swab, presence of viral mutation(s) within the areas targeted by this assay, and inadequate number of viral copies(<138 copies/mL). A negative result must be combined with clinical observations, patient history, and epidemiological information. The expected result is Negative.  Fact Sheet for Patients:  BloggerCourse.com  Fact Sheet for Healthcare Providers:  SeriousBroker.it  This test is no t yet approved or cleared by the [place]  FDA and  has been authorized for detection and/or diagnosis of SARS-CoV-2 by FDA under an Emergency Use Authorization (EUA). This EUA will remain  in effect  (meaning this test can be used) for the duration of the COVID-19 declaration under Section 564(b)(1) of the Act, 21 U.S.C.section 360bbb-3(b)(1), unless the authorization is terminated  or revoked sooner.       Influenza A by PCR NEGATIVE NEGATIVE Final   Influenza B by PCR NEGATIVE NEGATIVE Final    Comment: (NOTE) The Xpert Xpress SARS-CoV-2/FLU/RSV plus assay is intended as an aid in the diagnosis of influenza from Nasopharyngeal swab specimens and should not be used as a sole basis for treatment. Nasal washings and aspirates are unacceptable for Xpert Xpress SARS-CoV-2/FLU/RSV testing.  Fact Sheet for Patients: BloggerCourse.com  Fact Sheet for Healthcare Providers: SeriousBroker.it  This test is not yet approved or cleared by the [place]  FDA and has been authorized for detection and/or diagnosis of SARS-CoV-2 by FDA under an Emergency Use Authorization (EUA). This EUA will remain in effect (meaning this test can be used) for the duration of the COVID-19 declaration under Section 564(b)(1) of the Act, 21 U.S.C. section 360bbb-3(b)(1), unless the authorization is terminated or revoked.     Resp Syncytial Virus by PCR NEGATIVE NEGATIVE Final    Comment: (NOTE) Fact Sheet for Patients: BloggerCourse.com  Fact Sheet for Healthcare Providers: SeriousBroker.it  This test is not yet approved or cleared by the [place]  FDA and has been authorized for detection and/or diagnosis of SARS-CoV-2 by FDA under an Emergency Use Authorization (EUA). This EUA will remain in effect (meaning this test can be used) for the duration of the COVID-19 declaration under Section 564(b)(1) of the Act, 21 U.S.C. section 360bbb-3(b)(1), unless the authorization is terminated or revoked.  Performed at [facility], [address] [place], [place] [address]   Culture, blood  (Routine x 2)     Status: None   Collection Time: 09/10/24  7:12 PM   Specimen: BLOOD  Result Value Ref Range Status   Specimen Description BLOOD RIGHT ANTECUBITAL  Final   Special Requests   Final    BOTTLES DRAWN AEROBIC AND ANAEROBIC Blood Culture results may not be optimal due to an inadequate volume of blood received in culture bottles   Culture   Final    NO GROWTH 5 DAYS Performed at [facility], [address] [place], [place] [address]  Report Status 09/15/2024 FINAL  Final  Urine Culture     Status: Abnormal   Collection Time: 09/10/24 11:25 PM   Specimen: Urine, Random  Result Value Ref Range Status   Specimen Description   Final    URINE, RANDOM Performed at [facility], [address] [place], [place] [address]    Special Requests   Final    NONE Reflexed from [id number] Performed at [facility], [address] [place], [place] [address]    Culture 10,000 COLONIES/mL STREPTOCOCCUS GALLOLYTICUS (A)  Final   Report Status 09/14/2024 FINAL  Final   Organism ID, Bacteria STREPTOCOCCUS GALLOLYTICUS (A)  Final      Susceptibility   Streptococcus gallolyticus - MIC*    PENICILLIN 0.12 SENSITIVE Sensitive     CEFTRIAXONE  0.25 SENSITIVE Sensitive     ERYTHROMYCIN >=8 RESISTANT Resistant     LEVOFLOXACIN  4 INTERMEDIATE Intermediate     VANCOMYCIN  0.5 SENSITIVE Sensitive     * 10,000 COLONIES/mL STREPTOCOCCUS GALLOLYTICUS  Aerobic/Anaerobic Culture w Gram Stain (surgical/deep wound)     Status: None (Preliminary result)   Collection Time: 09/14/24  7:45 AM   Specimen: Wound; Tissue  Result Value Ref Range Status   Specimen Description   Final    WOUND Performed at [facility], [address] [place], [place] [address]    Special Requests   Final    NONE Performed at [facility], [address] [place], [place] [address]    Gram Stain   Final    RARE WBC SEEN FEW GRAM POSITIVE COCCI RARE GRAM NEGATIVE  RODS Performed at [facility], [address], [place], [place] [address]    Culture   Final    FEW STAPHYLOCOCCUS AUREUS NO ANAEROBES ISOLATED; CULTURE IN PROGRESS FOR 5 DAYS    Report Status PENDING  Incomplete   Organism ID, Bacteria STAPHYLOCOCCUS AUREUS  Final      Susceptibility   Staphylococcus aureus - MIC*    CIPROFLOXACIN <=0.5 SENSITIVE Sensitive     ERYTHROMYCIN <=0.25 SENSITIVE Sensitive     GENTAMICIN <=0.5 SENSITIVE Sensitive     OXACILLIN <=0.25 SENSITIVE Sensitive     TETRACYCLINE <=1 SENSITIVE Sensitive     VANCOMYCIN  1 SENSITIVE Sensitive     TRIMETH/SULFA <=10 SENSITIVE Sensitive     CLINDAMYCIN  <=0.25 SENSITIVE Sensitive     RIFAMPIN <=0.5 SENSITIVE Sensitive     Inducible Clindamycin  NEGATIVE Sensitive     LINEZOLID  2 SENSITIVE Sensitive     * FEW STAPHYLOCOCCUS AUREUS     Total time spend on discharging this patient, including the last patient exam, discussing the hospital stay, instructions for ongoing care as it relates to all pertinent caregivers, as well as preparing the medical discharge records, prescriptions, and/or referrals as applicable, is 35 minutes.    Ellouise Haber, MD  [facility] 09/16/2024, 12:53 PM

## 2024-09-16 NOTE — Progress Notes (Signed)
 Reviewed discharge instructions with patient. Patient acknowledged understanding. Patient received meds to bedside. Patient discharged with personal belongings. Patient wheeled out by staff. Patient transported home via family vehicle. No distress noted in patient.

## 2024-09-16 NOTE — Plan of Care (Signed)

## 2024-09-16 NOTE — Progress Notes (Signed)
 [facility]  PROGRESS NOTE   Subjective:   Patient seen at bedside.  Feels much better.  Sitting in bed in NAD.  Objective:  Vital signs: Blood pressure (!) 151/88, pulse (!) 55, temperature 98.4 F (36.9 C), temperature source Oral, resp. rate 16, height 5' 7 (1.702 m), weight 113 kg, SpO2 99%.  Intake/Output Summary (Last 24 hours) at 09/16/2024 1110 Last data filed at 09/16/2024 0900 Gross per 24 hour  Intake 480 ml  Output --  Net 480 ml   Filed Weights   09/10/24 1755 09/11/24 0048  Weight: 109.3 kg 113 kg     Physical Exam: General:  No acute distress  Head:  Normocephalic, atraumatic. Moist oral mucosal membranes  Eyes:  Anicteric  Neck:  Supple  Lungs:   Clear to auscultation, normal effort  Heart:  S1S2 no rubs  Abdomen:   Soft, nontender, bowel sounds present  Extremities:  peripheral edema.  Neurologic:  Awake, alert, following commands  Skin:  No lesions  Access:     Basic Metabolic Panel: Recent Labs  Lab 09/11/24 0138 09/12/24 0257 09/13/24 0421 09/14/24 0552 09/15/24 0715 09/16/24 0537  NA 136 137 133*  --  141 138  K 3.3* 3.3* 3.2* 3.5 3.9 3.3*  CL 98 100 95*  --  100 96*  CO2 26 23 25   --  28 23  GLUCOSE 161* 147* 257*  --  168* 198*  BUN 31* 49* 45*  --  37* 52*  CREATININE 3.80* 5.12* 4.77*  --  4.23* 4.94*  CALCIUM  8.7* 8.9 8.7*  --  9.3 9.3  MG  --  2.1 2.3  --   --   --   PHOS  --  5.0* 5.2*  --   --   --    GFR: Estimated Creatinine Clearance: 15.1 mL/min (A) (by C-G formula based on SCr of 4.94 mg/dL (H)).  Liver Function Tests: Recent Labs  Lab 09/10/24 1749 09/12/24 0257 09/13/24 0421  AST 30 17 38  ALT 31 24 39  ALKPHOS 47 43 56  BILITOT 0.9 0.8 0.5  PROT 7.8 6.4* 6.6  ALBUMIN 3.4* 2.7* 2.8*   No results for input(s): LIPASE, AMYLASE in the last 168 hours. No results for input(s): AMMONIA in the last 168 hours.  CBC: Recent Labs  Lab 09/10/24 1749 09/11/24 0138 09/12/24 0257 09/13/24 0421  09/15/24 0715 09/16/24 0537  WBC 16.6* 18.0* 16.6* 16.8* 17.4* 17.8*  NEUTROABS 13.2*  --  13.3* 12.9*  --   --   HGB 10.1* 9.7* 8.1* 9.1* 9.0* 10.0*  HCT 30.5* 29.3* 24.5* 27.5* 28.0* 29.9*  MCV 91.0 91.8 91.4 91.1 91.5 92.0  PLT 321 307 253 303 381 429*     HbA1C: Hgb A1c MFr Bld  Date/Time Value Ref Range Status  07/05/2024 01:10 PM 6.7 (H) 4.8 - 5.6 % Final    Comment:    (NOTE) Diagnosis of Diabetes The following HbA1c ranges recommended by the American Diabetes Association (ADA) may be used as an aid in the diagnosis of diabetes mellitus.  Hemoglobin             Suggested A1C NGSP%              Diagnosis  <5.7                   Non Diabetic  5.7-6.4                Pre-Diabetic  >6.4  Diabetic  <7.0                   Glycemic control for                       adults with diabetes.    11/26/2023 12:28 PM 8.1 (H) 4.8 - 5.6 % Final    Comment:    (NOTE) Pre diabetes:          5.7%-6.4%  Diabetes:              >6.4%  Glycemic control for   <7.0% adults with diabetes     Urinalysis: No results for input(s): COLORURINE, LABSPEC, PHURINE, GLUCOSEU, HGBUR, BILIRUBINUR, KETONESUR, PROTEINUR, UROBILINOGEN, NITRITE, LEUKOCYTESUR in the last 72 hours.  Invalid input(s): APPERANCEUR    Imaging: US  Venous Img Lower Unilateral Right (DVT) Result Date: 09/15/2024 CLINICAL DATA:  Right calf pain. EXAM: RIGHT LOWER EXTREMITY VENOUS DOPPLER ULTRASOUND TECHNIQUE: Gray-scale sonography with graded compression, as well as color Doppler and duplex ultrasound were performed to evaluate the lower extremity deep venous systems from the level of the common femoral vein and including the common femoral, femoral, profunda femoral, popliteal and calf veins including the posterior tibial, peroneal and gastrocnemius veins when visible. The superficial great saphenous vein was also interrogated. Spectral Doppler was utilized to evaluate flow at  rest and with distal augmentation maneuvers in the common femoral, femoral and popliteal veins. COMPARISON:  None Available. FINDINGS: Contralateral Common Femoral Vein: Respiratory phasicity is normal and symmetric with the symptomatic side. No evidence of thrombus. Normal compressibility. Common Femoral Vein: No evidence of thrombus. Normal compressibility, respiratory phasicity and response to augmentation. Saphenofemoral Junction: No evidence of thrombus. Normal compressibility and flow on color Doppler imaging. Profunda Femoral Vein: No evidence of thrombus. Normal compressibility and flow on color Doppler imaging. Femoral Vein: No evidence of thrombus. Normal compressibility, respiratory phasicity and response to augmentation. Popliteal Vein: No evidence of thrombus. Normal compressibility, respiratory phasicity and response to augmentation. Calf Veins: No evidence of thrombus. Normal compressibility and flow on color Doppler imaging. Superficial Great Saphenous Vein: No evidence of thrombus. Normal compressibility. Venous Reflux:  None. Other Findings: No evidence of superficial thrombophlebitis or abnormal fluid collection. IMPRESSION: No evidence of right lower extremity deep venous thrombosis. Electronically Signed   By: Marcey Moan M.D.   On: 09/15/2024 15:49     Medications:    ampicillin -sulbactam (UNASYN ) IV 3 g (09/15/24 2204)   linezolid  (ZYVOX ) IV 600 mg (09/15/24 2346)    allopurinol   200 mg Oral Q breakfast   amLODipine   5 mg Oral QHS   vitamin C   500 mg Oral BID   aspirin  EC  81 mg Oral Q breakfast   Chlorhexidine  Gluconate Cloth  6 each Topical Q0600   clopidogrel   75 mg Oral Q breakfast   epoetin  alfa-epbx (RETACRIT ) injection  4,000 Units Intravenous Q M,W,F-1800   feeding supplement (NEPRO CARB STEADY)  237 mL Oral TID BM   heparin   5,000 Units Subcutaneous Q8H   insulin  aspart  0-6 Units Subcutaneous TID WC   insulin  glargine  60 Units Subcutaneous Q2200   losartan    100 mg Oral QHS   metoprolol  tartrate  50 mg Oral BID   multivitamin  1 tablet Oral QHS   rosuvastatin   40 mg Oral QHS   sevelamer  carbonate  800 mg Oral TID with meals   sodium chloride  flush  3 mL Intravenous Q12H   zinc  sulfate (  50mg  elemental zinc )  220 mg Oral Daily    Assessment/ Plan:     63 y.o. female with PMHx significant for  diabetes, chronic diabetic neuropathy, ESRD on HD, diabetic retinopathy with clinical blindness, diastolic congestive heart failure, CAD status post CABG, obesity, OSA on CPAP  with gas gangrene of right 2nd toe with osteomyelitis of the distal phalanx. She is POD 3 status post right 2nd toe amputation on 9/26 with Dr. Malvin.   #1: ESRD: Patient had dialysis yesterday via AV fistula and has tolerated well.  For dialysis tomorrow.  Orders as written.   #2: Anemia: Continue anemia protocols.  She had been on Retacrit  at dialysis.   #3: Secondary hyperparathyroidism: Continue sevelamer  and calcium .   #4: Sepsis: Patient had to amputation.  She has been on ampicillin  and linezolid .   #5: Diabetes: Continue insulin  as per protocol.   #6: Hypertension: Continue metoprolol , losartan  and amlodipine .    Labs and medications reviewed. Will continue to follow along with you.   LOS: 6 Cheresa Siers, MD [facility] 9/28/202511:10 AM

## 2024-09-16 NOTE — Plan of Care (Signed)
  Problem: Education: Goal: Ability to describe self-care measures that may prevent or decrease complications (Diabetes Survival Skills Education) will improve Outcome: Progressing   Problem: Coping: Goal: Ability to adjust to condition or change in health will improve Outcome: Progressing

## 2024-09-17 LAB — SURGICAL PATHOLOGY

## 2024-09-20 LAB — AEROBIC/ANAEROBIC CULTURE W GRAM STAIN (SURGICAL/DEEP WOUND)

## 2024-09-25 ENCOUNTER — Ambulatory Visit: Admitting: Podiatry

## 2024-09-25 DIAGNOSIS — Z89421 Acquired absence of other right toe(s): Secondary | ICD-10-CM

## 2024-09-25 NOTE — Progress Notes (Signed)
 Subjective:  Patient ID: Sharon Arias, female    DOB: 1961/01/18,  MRN: [id number]  Chief Complaint  Patient presents with   Routine Post Op    Follow up. Amputee of 2nd toe on the right foot    DOS: 09/14/2024 Procedure: Right second digit amputation  63 y.o. female returns for post-op check.  Patient states she is doing well denies any other acute complaints weightbearing as tolerated surgical shoe bandages clean dry and intact.  Review of Systems: Negative except as noted in the HPI. Denies N/V/F/Ch.  Past Medical History:  Diagnosis Date   Anemia in chronic kidney disease 05/19/2016   Anxiety    Aortic atherosclerosis    Blind    CAD (coronary artery disease)    Cardiac murmur    CHF (congestive heart failure) (HCC)    G2DD on 02/2020 TTE   Chicken pox    Chronic anticoagulation    CVA (cerebral vascular accident) (HCC)    DDD (degenerative disc disease), lumbar    Detached retina    Diabetic retinopathy (HCC)    legally blind   Diastolic congestive heart failure (HCC)    Dyspnea    ESRD needing dialysis (HCC)    Hx of CABG    Hyperlipidemia    Hypertension    NSTEMI (non-ST elevated myocardial infarction) (HCC) 06/2012   Obesity    Pneumonia    PONV (postoperative nausea and vomiting)    Sciatica of right side    Secondary hyperparathyroidism of renal origin    Sleep apnea    CPAP NIGHTLY   Stage 5 chronic kidney disease (HCC)    T2DM (type 2 diabetes mellitus) (HCC)     Current Outpatient Medications:    acetaminophen  (TYLENOL ) 325 MG tablet, Take 2 tablets (650 mg total) by mouth every 4 (four) hours as needed for mild pain (pain score 1-3) (or temp > 37.5 C (99.5 F))., Disp: , Rfl:    allopurinol  (ZYLOPRIM ) 300 MG tablet, Take 1 tablet (300 mg total) by mouth in the morning., Disp: 30 tablet, Rfl: 0   amLODipine  (NORVASC ) 5 MG tablet, Take 1 tablet (5 mg total) by mouth at bedtime., Disp: 30 tablet, Rfl: 0   amoxicillin -clavulanate (AUGMENTIN) 500-125  MG tablet, Take 1 tablet by mouth 2 (two) times daily for 10 days., Disp: 20 tablet, Rfl: 0   ascorbic acid  (VITAMIN C ) 500 MG tablet, Take 1 tablet (500 mg total) by mouth 2 (two) times daily., Disp: , Rfl:    aspirin  EC 81 MG tablet, Take 81 mg by mouth in the morning., Disp: , Rfl:    clopidogrel  (PLAVIX ) 75 MG tablet, Take 1 tablet (75 mg total) by mouth daily., Disp: 30 tablet, Rfl: 0   doxycycline (VIBRA-TABS) 100 MG tablet, Take 1 tablet (100 mg total) by mouth every 12 (twelve) hours for 10 days., Disp: 20 tablet, Rfl: 0   epoetin  alfa-epbx (RETACRIT ) 10000 UNIT/ML injection, Inject 0.4 mLs (4,000 Units total) into the vein every Monday, Wednesday, and Friday at 6 PM., Disp: , Rfl:    furosemide  (LASIX ) 40 MG tablet, Take 1 tablet (40 mg total) by mouth daily as needed for edema., Disp: 30 tablet, Rfl: 0   hydrALAZINE  (APRESOLINE ) 50 MG tablet, Take 1 tablet (50 mg total) by mouth in the morning and at bedtime., Disp: 60 tablet, Rfl: 0   insulin  lispro (HUMALOG ) 100 UNIT/ML KwikPen, Inject 20 Units into the skin 3 (three) times daily with meals., Disp: 15 mL, Rfl:  11   losartan  (COZAAR ) 100 MG tablet, Take 1 tablet (100 mg total) by mouth at bedtime., Disp: 30 tablet, Rfl: 0   metoprolol  tartrate (LOPRESSOR ) 50 MG tablet, Take 1 tablet (50 mg total) by mouth 2 (two) times daily., Disp: 60 tablet, Rfl: 0   multivitamin (RENA-VIT) TABS tablet, Take 1 tablet by mouth at bedtime., Disp: , Rfl:    Nutritional Supplements (FEEDING SUPPLEMENT, NEPRO CARB STEADY,) LIQD, Take 237 mLs by mouth 3 (three) times daily between meals., Disp: , Rfl:    rosuvastatin  (CRESTOR ) 40 MG tablet, Take 40 mg by mouth.  Take 40 mg by mouth., Disp: , Rfl:    sevelamer  carbonate (RENVELA ) 800 MG tablet, Take 1 tablet (800 mg total) by mouth 3 (three) times daily., Disp: 30 tablet, Rfl: 0   TRESIBA  FLEXTOUCH 200 UNIT/ML FlexTouch Pen, Inject 60 Units into the skin daily. Reduced from 75 units., Disp: , Rfl:    zinc   sulfate, 50mg  elemental zinc , 220 (50 Zn) MG capsule, Take 1 capsule (220 mg total) by mouth daily., Disp: , Rfl:   Social History   Tobacco Use  Smoking Status Never   Passive exposure: Never  Smokeless Tobacco Never    Allergies  Allergen Reactions   Ozempic (0.25 Or 0.5 Mg-Dose) [Semaglutide(0.25 Or 0.5mg -Dos)] Diarrhea and Nausea Only   Trulicity [Dulaglutide] Diarrhea and Nausea Only   Other     Anesthesia--nausea/vomiting   Objective:  There were no vitals filed for this visit. There is no height or weight on file to calculate BMI. Constitutional Well developed. Well nourished.  Vascular Foot warm and well perfused. Capillary refill normal to all digits.   Neurologic Normal speech. Oriented to person, place, and time. Epicritic sensation to light touch grossly present bilaterally.  Dermatologic Skin healing well without signs of infection. Skin edges well coapted without signs of infection.  Orthopedic: Tenderness to palpation noted about the surgical site.   Radiographs: None Assessment:   1. History of amputation of lesser toe, right    Plan:  Patient was evaluated and treated and all questions answered.  S/p foot surgery right -Progressing as expected post-operatively. -XR: See above -WB Status: Weightbearing as tolerated in surgical shoe -Sutures: Intact.  No clinical signs of dehiscence or no complication noted. -Medications: None -Foot redressed.  No follow-ups on file.

## 2024-10-09 ENCOUNTER — Ambulatory Visit: Admitting: Podiatry

## 2024-10-09 DIAGNOSIS — Z89421 Acquired absence of other right toe(s): Secondary | ICD-10-CM

## 2024-10-09 NOTE — Progress Notes (Signed)
 Subjective:  Patient ID: Sharon Arias, female    DOB: December 31, 1960,  MRN: [id number]  Chief Complaint  Patient presents with   Routine Post Op    DOS: 09/14/2024 Procedure: Right second digit amputation  63 y.o. female returns for post-op check.  Patient states she is doing well denies any other acute complaints weightbearing as tolerated surgical shoe bandages clean dry and intact.  Review of Systems: Negative except as noted in the HPI. Denies N/V/F/Ch.  Past Medical History:  Diagnosis Date   Anemia in chronic kidney disease 05/19/2016   Anxiety    Aortic atherosclerosis    Blind    CAD (coronary artery disease)    Cardiac murmur    CHF (congestive heart failure) (HCC)    G2DD on 02/2020 TTE   Chicken pox    Chronic anticoagulation    CVA (cerebral vascular accident) (HCC)    DDD (degenerative disc disease), lumbar    Detached retina    Diabetic retinopathy (HCC)    legally blind   Diastolic congestive heart failure (HCC)    Dyspnea    ESRD needing dialysis (HCC)    Hx of CABG    Hyperlipidemia    Hypertension    NSTEMI (non-ST elevated myocardial infarction) (HCC) 06/2012   Obesity    Pneumonia    PONV (postoperative nausea and vomiting)    Sciatica of right side    Secondary hyperparathyroidism of renal origin    Sleep apnea    CPAP NIGHTLY   Stage 5 chronic kidney disease (HCC)    T2DM (type 2 diabetes mellitus) (HCC)     Current Outpatient Medications:    acetaminophen  (TYLENOL ) 325 MG tablet, Take 2 tablets (650 mg total) by mouth every 4 (four) hours as needed for mild pain (pain score 1-3) (or temp > 37.5 C (99.5 F))., Disp: , Rfl:    allopurinol  (ZYLOPRIM ) 300 MG tablet, Take 1 tablet (300 mg total) by mouth in the morning., Disp: 30 tablet, Rfl: 0   amLODipine  (NORVASC ) 5 MG tablet, Take 1 tablet (5 mg total) by mouth at bedtime., Disp: 30 tablet, Rfl: 0   ascorbic acid  (VITAMIN C ) 500 MG tablet, Take 1 tablet (500 mg total) by mouth 2 (two) times  daily., Disp: , Rfl:    aspirin  EC 81 MG tablet, Take 81 mg by mouth in the morning., Disp: , Rfl:    clopidogrel  (PLAVIX ) 75 MG tablet, Take 1 tablet (75 mg total) by mouth daily., Disp: 30 tablet, Rfl: 0   epoetin  alfa-epbx (RETACRIT ) 10000 UNIT/ML injection, Inject 0.4 mLs (4,000 Units total) into the vein every Monday, Wednesday, and Friday at 6 PM., Disp: , Rfl:    furosemide  (LASIX ) 40 MG tablet, Take 1 tablet (40 mg total) by mouth daily as needed for edema., Disp: 30 tablet, Rfl: 0   hydrALAZINE  (APRESOLINE ) 50 MG tablet, Take 1 tablet (50 mg total) by mouth in the morning and at bedtime., Disp: 60 tablet, Rfl: 0   insulin  lispro (HUMALOG ) 100 UNIT/ML KwikPen, Inject 20 Units into the skin 3 (three) times daily with meals., Disp: 15 mL, Rfl: 11   losartan  (COZAAR ) 100 MG tablet, Take 1 tablet (100 mg total) by mouth at bedtime., Disp: 30 tablet, Rfl: 0   metoprolol  tartrate (LOPRESSOR ) 50 MG tablet, Take 1 tablet (50 mg total) by mouth 2 (two) times daily., Disp: 60 tablet, Rfl: 0   multivitamin (RENA-VIT) TABS tablet, Take 1 tablet by mouth at bedtime., Disp: , Rfl:  Nutritional Supplements (FEEDING SUPPLEMENT, NEPRO CARB STEADY,) LIQD, Take 237 mLs by mouth 3 (three) times daily between meals., Disp: , Rfl:    rosuvastatin  (CRESTOR ) 40 MG tablet, Take 40 mg by mouth.  Take 40 mg by mouth., Disp: , Rfl:    sevelamer  carbonate (RENVELA ) 800 MG tablet, Take 1 tablet (800 mg total) by mouth 3 (three) times daily., Disp: 30 tablet, Rfl: 0   TRESIBA  FLEXTOUCH 200 UNIT/ML FlexTouch Pen, Inject 60 Units into the skin daily. Reduced from 75 units., Disp: , Rfl:    zinc  sulfate, 50mg  elemental zinc , 220 (50 Zn) MG capsule, Take 1 capsule (220 mg total) by mouth daily., Disp: , Rfl:   Social History   Tobacco Use  Smoking Status Never   Passive exposure: Never  Smokeless Tobacco Never    Allergies  Allergen Reactions   Ozempic (0.25 Or 0.5 Mg-Dose) [Semaglutide(0.25 Or 0.5mg -Dos)] Diarrhea  and Nausea Only   Trulicity [Dulaglutide] Diarrhea and Nausea Only   Other     Anesthesia--nausea/vomiting   Objective:  There were no vitals filed for this visit. There is no height or weight on file to calculate BMI. Constitutional Well developed. Well nourished.  Vascular Foot warm and well perfused. Capillary refill normal to all digits.   Neurologic Normal speech. Oriented to person, place, and time. Epicritic sensation to light touch grossly present bilaterally.  Dermatologic Dehiscence of the skin noted.  Fibrogranular wound base noted.  Probing down to deep tissue no bone.  No other signs of infection noted  Orthopedic: Tenderness to palpation noted about the surgical site.   Radiographs: None Assessment:   No diagnosis found.  Plan:  Patient was evaluated and treated and all questions answered.  S/p foot surgery right -Progressing as expected post-operatively. -XR: See above -WB Status: Weightbearing as tolerated in surgical shoe -Sutures: Removed.  Dehiscence noted -Medications: None - Patient is experiencing dehiscence probing down to deep tissue at this time I encouraged her to do aggressive Betadine wet-to-dry dressing she states understanding if there is no improvement we will discuss referral to the wound care center during next visit  No follow-ups on file.

## 2024-11-01 ENCOUNTER — Ambulatory Visit (INDEPENDENT_AMBULATORY_CARE_PROVIDER_SITE_OTHER): Admitting: Podiatry

## 2024-11-01 DIAGNOSIS — Z89421 Acquired absence of other right toe(s): Secondary | ICD-10-CM

## 2024-11-01 DIAGNOSIS — L97512 Non-pressure chronic ulcer of other part of right foot with fat layer exposed: Secondary | ICD-10-CM | POA: Diagnosis not present

## 2024-11-01 NOTE — Progress Notes (Signed)
 Subjective:  Patient ID: Sharon Arias, female    DOB: 10/05/1961,  MRN: [id number]  Chief Complaint  Patient presents with   Routine Post Op    DOS: 09/14/2024 Procedure: Right second digit amputation  63 y.o. female returns for post-op check.  Patient states she is doing well denies any other acute complaints weightbearing as tolerated surgical shoe bandages clean dry and intact.  Review of Systems: Negative except as noted in the HPI. Denies N/V/F/Ch.  Past Medical History:  Diagnosis Date   Anemia in chronic kidney disease 05/19/2016   Anxiety    Aortic atherosclerosis    Blind    CAD (coronary artery disease)    Cardiac murmur    CHF (congestive heart failure) (HCC)    G2DD on 02/2020 TTE   Chicken pox    Chronic anticoagulation    CVA (cerebral vascular accident) (HCC)    DDD (degenerative disc disease), lumbar    Detached retina    Diabetic retinopathy (HCC)    legally blind   Diastolic congestive heart failure (HCC)    Dyspnea    ESRD needing dialysis (HCC)    Hx of CABG    Hyperlipidemia    Hypertension    NSTEMI (non-ST elevated myocardial infarction) (HCC) 06/2012   Obesity    Pneumonia    PONV (postoperative nausea and vomiting)    Sciatica of right side    Secondary hyperparathyroidism of renal origin    Sleep apnea    CPAP NIGHTLY   Stage 5 chronic kidney disease (HCC)    T2DM (type 2 diabetes mellitus) (HCC)     Current Outpatient Medications:    acetaminophen  (TYLENOL ) 325 MG tablet, Take 2 tablets (650 mg total) by mouth every 4 (four) hours as needed for mild pain (pain score 1-3) (or temp > 37.5 C (99.5 F))., Disp: , Rfl:    allopurinol  (ZYLOPRIM ) 300 MG tablet, Take 1 tablet (300 mg total) by mouth in the morning., Disp: 30 tablet, Rfl: 0   amLODipine  (NORVASC ) 5 MG tablet, Take 1 tablet (5 mg total) by mouth at bedtime., Disp: 30 tablet, Rfl: 0   ascorbic acid  (VITAMIN C ) 500 MG tablet, Take 1 tablet (500 mg total) by mouth 2 (two) times  daily., Disp: , Rfl:    aspirin  EC 81 MG tablet, Take 81 mg by mouth in the morning., Disp: , Rfl:    clopidogrel  (PLAVIX ) 75 MG tablet, Take 1 tablet (75 mg total) by mouth daily., Disp: 30 tablet, Rfl: 0   epoetin  alfa-epbx (RETACRIT ) 10000 UNIT/ML injection, Inject 0.4 mLs (4,000 Units total) into the vein every Monday, Wednesday, and Friday at 6 PM., Disp: , Rfl:    furosemide  (LASIX ) 40 MG tablet, Take 1 tablet (40 mg total) by mouth daily as needed for edema., Disp: 30 tablet, Rfl: 0   hydrALAZINE  (APRESOLINE ) 50 MG tablet, Take 1 tablet (50 mg total) by mouth in the morning and at bedtime., Disp: 60 tablet, Rfl: 0   insulin  lispro (HUMALOG ) 100 UNIT/ML KwikPen, Inject 20 Units into the skin 3 (three) times daily with meals., Disp: 15 mL, Rfl: 11   losartan  (COZAAR ) 100 MG tablet, Take 1 tablet (100 mg total) by mouth at bedtime., Disp: 30 tablet, Rfl: 0   metoprolol  tartrate (LOPRESSOR ) 50 MG tablet, Take 1 tablet (50 mg total) by mouth 2 (two) times daily., Disp: 60 tablet, Rfl: 0   multivitamin (RENA-VIT) TABS tablet, Take 1 tablet by mouth at bedtime., Disp: , Rfl:  Nutritional Supplements (FEEDING SUPPLEMENT, NEPRO CARB STEADY,) LIQD, Take 237 mLs by mouth 3 (three) times daily between meals., Disp: , Rfl:    rosuvastatin  (CRESTOR ) 40 MG tablet, Take 40 mg by mouth.  Take 40 mg by mouth., Disp: , Rfl:    sevelamer  carbonate (RENVELA ) 800 MG tablet, Take 1 tablet (800 mg total) by mouth 3 (three) times daily., Disp: 30 tablet, Rfl: 0   TRESIBA  FLEXTOUCH 200 UNIT/ML FlexTouch Pen, Inject 60 Units into the skin daily. Reduced from 75 units., Disp: , Rfl:    zinc  sulfate, 50mg  elemental zinc , 220 (50 Zn) MG capsule, Take 1 capsule (220 mg total) by mouth daily., Disp: , Rfl:   Social History   Tobacco Use  Smoking Status Never   Passive exposure: Never  Smokeless Tobacco Never    Allergies  Allergen Reactions   Ozempic (0.25 Or 0.5 Mg-Dose) [Semaglutide(0.25 Or 0.5mg -Dos)] Diarrhea  and Nausea Only   Trulicity [Dulaglutide] Diarrhea and Nausea Only   Other     Anesthesia--nausea/vomiting   Objective:  There were no vitals filed for this visit. There is no height or weight on file to calculate BMI. Constitutional Well developed. Well nourished.  Vascular Foot warm and well perfused. Capillary refill normal to all digits.   Neurologic Normal speech. Oriented to person, place, and time. Epicritic sensation to light touch grossly present bilaterally.  Dermatologic Dehiscence of the skin noted.  Fibrogranular wound base noted.  Probing down to deep tissue no bone.  No other signs of infection noted  Orthopedic: Tenderness to palpation noted about the surgical site.   Radiographs: None Assessment:   1. History of amputation of lesser toe, right   2. Right foot ulcer, with fat layer exposed (HCC)     Plan:  Patient was evaluated and treated and all questions answered.  S/p foot surgery right -Progressing as expected post-operatively. -XR: See above -WB Status: Weightbearing as tolerated in surgical shoe -Sutures: Removed.  Dehiscence noted -Medications: None - The wound has reached a point of stagnation patient will benefit from continue from Betadine wet-to-dry dressing and referral to the wound care center the wound care center order was placed.  No follow-ups on file.

## 2024-11-29 ENCOUNTER — Ambulatory Visit: Admitting: Podiatry

## 2024-11-29 DIAGNOSIS — Z9889 Other specified postprocedural states: Secondary | ICD-10-CM

## 2024-11-29 DIAGNOSIS — Z89421 Acquired absence of other right toe(s): Secondary | ICD-10-CM

## 2024-11-29 NOTE — Progress Notes (Signed)
 Subjective:  Patient ID: Sharon Arias, female    DOB: 03-Feb-1961,  MRN: [id number]  Chief Complaint  Patient presents with   Routine Post Op    DOS: 09/14/2024 Procedure: Right second digit amputation  63 y.o. female returns for post-op check.  Patient states she is doing well denies any other acute complaints weightbearing as tolerated surgical shoe bandages clean dry and intact.  Review of Systems: Negative except as noted in the HPI. Denies N/V/F/Ch.  Past Medical History:  Diagnosis Date   Anemia in chronic kidney disease 05/19/2016   Anxiety    Aortic atherosclerosis    Blind    CAD (coronary artery disease)    Cardiac murmur    CHF (congestive heart failure) (HCC)    G2DD on 02/2020 TTE   Chicken pox    Chronic anticoagulation    CVA (cerebral vascular accident) (HCC)    DDD (degenerative disc disease), lumbar    Detached retina    Diabetic retinopathy (HCC)    legally blind   Diastolic congestive heart failure (HCC)    Dyspnea    ESRD needing dialysis (HCC)    Hx of CABG    Hyperlipidemia    Hypertension    NSTEMI (non-ST elevated myocardial infarction) (HCC) 06/2012   Obesity    Pneumonia    PONV (postoperative nausea and vomiting)    Sciatica of right side    Secondary hyperparathyroidism of renal origin    Sleep apnea    CPAP NIGHTLY   Stage 5 chronic kidney disease (HCC)    T2DM (type 2 diabetes mellitus) (HCC)     Current Outpatient Medications:    acetaminophen  (TYLENOL ) 325 MG tablet, Take 2 tablets (650 mg total) by mouth every 4 (four) hours as needed for mild pain (pain score 1-3) (or temp > 37.5 C (99.5 F))., Disp: , Rfl:    allopurinol  (ZYLOPRIM ) 300 MG tablet, Take 1 tablet (300 mg total) by mouth in the morning., Disp: 30 tablet, Rfl: 0   amLODipine  (NORVASC ) 5 MG tablet, Take 1 tablet (5 mg total) by mouth at bedtime., Disp: 30 tablet, Rfl: 0   ascorbic acid  (VITAMIN C ) 500 MG tablet, Take 1 tablet (500 mg total) by mouth 2 (two) times  daily., Disp: , Rfl:    aspirin  EC 81 MG tablet, Take 81 mg by mouth in the morning., Disp: , Rfl:    clopidogrel  (PLAVIX ) 75 MG tablet, Take 1 tablet (75 mg total) by mouth daily., Disp: 30 tablet, Rfl: 0   epoetin  alfa-epbx (RETACRIT ) 10000 UNIT/ML injection, Inject 0.4 mLs (4,000 Units total) into the vein every Monday, Wednesday, and Friday at 6 PM., Disp: , Rfl:    furosemide  (LASIX ) 40 MG tablet, Take 1 tablet (40 mg total) by mouth daily as needed for edema., Disp: 30 tablet, Rfl: 0   hydrALAZINE  (APRESOLINE ) 50 MG tablet, Take 1 tablet (50 mg total) by mouth in the morning and at bedtime., Disp: 60 tablet, Rfl: 0   insulin  lispro (HUMALOG ) 100 UNIT/ML KwikPen, Inject 20 Units into the skin 3 (three) times daily with meals., Disp: 15 mL, Rfl: 11   losartan  (COZAAR ) 100 MG tablet, Take 1 tablet (100 mg total) by mouth at bedtime., Disp: 30 tablet, Rfl: 0   metoprolol  tartrate (LOPRESSOR ) 50 MG tablet, Take 1 tablet (50 mg total) by mouth 2 (two) times daily., Disp: 60 tablet, Rfl: 0   multivitamin (RENA-VIT) TABS tablet, Take 1 tablet by mouth at bedtime., Disp: , Rfl:  Nutritional Supplements (FEEDING SUPPLEMENT, NEPRO CARB STEADY,) LIQD, Take 237 mLs by mouth 3 (three) times daily between meals., Disp: , Rfl:    rosuvastatin  (CRESTOR ) 40 MG tablet, Take 40 mg by mouth.  Take 40 mg by mouth., Disp: , Rfl:    sevelamer  carbonate (RENVELA ) 800 MG tablet, Take 1 tablet (800 mg total) by mouth 3 (three) times daily., Disp: 30 tablet, Rfl: 0   TRESIBA  FLEXTOUCH 200 UNIT/ML FlexTouch Pen, Inject 60 Units into the skin daily. Reduced from 75 units., Disp: , Rfl:    zinc  sulfate, 50mg  elemental zinc , 220 (50 Zn) MG capsule, Take 1 capsule (220 mg total) by mouth daily., Disp: , Rfl:   Social History   Tobacco Use  Smoking Status Never   Passive exposure: Never  Smokeless Tobacco Never    Allergies  Allergen Reactions   Ozempic (0.25 Or 0.5 Mg-Dose) [Semaglutide(0.25 Or 0.5mg -Dos)] Diarrhea  and Nausea Only   Trulicity [Dulaglutide] Diarrhea and Nausea Only   Other     Anesthesia--nausea/vomiting   Objective:  There were no vitals filed for this visit. There is no height or weight on file to calculate BMI. Constitutional Well developed. Well nourished.  Vascular Foot warm and well perfused. Capillary refill normal to all digits.   Neurologic Normal speech. Oriented to person, place, and time. Epicritic sensation to light touch grossly present bilaterally.  Dermatologic Dehiscence of the skin noted.  Fibrogranular wound base noted.  Probing down to deep tissue no bone.  No other signs of infection noted  Orthopedic: Tenderness to palpation noted about the surgical site.   Radiographs: None Assessment:   No diagnosis found.   Plan:  Patient was evaluated and treated and all questions answered.  S/p foot surgery right -Progressing as expected post-operatively. -XR: See above -WB Status: Weightbearing as tolerated in surgical shoe -Sutures: Removed.  Dehiscence noted -Medications: None - Continue Betadine wet-to-dry dressing at this time patient is scheduled for wound care center next Tuesday encouraged her to make continue following up with the wound care center for advanced wound care - I will see her back again in 3 months to evaluate she states understanding I will continue to follow from periphery  No follow-ups on file.

## 2024-12-04 ENCOUNTER — Encounter: Attending: Internal Medicine | Admitting: Internal Medicine

## 2024-12-04 DIAGNOSIS — E1151 Type 2 diabetes mellitus with diabetic peripheral angiopathy without gangrene: Secondary | ICD-10-CM | POA: Insufficient documentation

## 2024-12-04 DIAGNOSIS — G4733 Obstructive sleep apnea (adult) (pediatric): Secondary | ICD-10-CM | POA: Insufficient documentation

## 2024-12-04 DIAGNOSIS — Z951 Presence of aortocoronary bypass graft: Secondary | ICD-10-CM | POA: Insufficient documentation

## 2024-12-04 DIAGNOSIS — Z992 Dependence on renal dialysis: Secondary | ICD-10-CM | POA: Insufficient documentation

## 2024-12-04 DIAGNOSIS — I130 Hypertensive heart and chronic kidney disease with heart failure and stage 1 through stage 4 chronic kidney disease, or unspecified chronic kidney disease: Secondary | ICD-10-CM | POA: Insufficient documentation

## 2024-12-04 DIAGNOSIS — Z8673 Personal history of transient ischemic attack (TIA), and cerebral infarction without residual deficits: Secondary | ICD-10-CM | POA: Insufficient documentation

## 2024-12-04 DIAGNOSIS — E11621 Type 2 diabetes mellitus with foot ulcer: Secondary | ICD-10-CM | POA: Insufficient documentation

## 2024-12-04 DIAGNOSIS — E11319 Type 2 diabetes mellitus with unspecified diabetic retinopathy without macular edema: Secondary | ICD-10-CM | POA: Insufficient documentation

## 2024-12-04 DIAGNOSIS — E1122 Type 2 diabetes mellitus with diabetic chronic kidney disease: Secondary | ICD-10-CM | POA: Insufficient documentation

## 2024-12-04 DIAGNOSIS — L97518 Non-pressure chronic ulcer of other part of right foot with other specified severity: Secondary | ICD-10-CM | POA: Insufficient documentation

## 2024-12-04 DIAGNOSIS — N189 Chronic kidney disease, unspecified: Secondary | ICD-10-CM | POA: Insufficient documentation

## 2024-12-04 DIAGNOSIS — Z7902 Long term (current) use of antithrombotics/antiplatelets: Secondary | ICD-10-CM | POA: Insufficient documentation

## 2024-12-04 DIAGNOSIS — I5032 Chronic diastolic (congestive) heart failure: Secondary | ICD-10-CM | POA: Insufficient documentation

## 2024-12-04 DIAGNOSIS — T8131XA Disruption of external operation (surgical) wound, not elsewhere classified, initial encounter: Secondary | ICD-10-CM | POA: Insufficient documentation

## 2024-12-04 DIAGNOSIS — Y838 Other surgical procedures as the cause of abnormal reaction of the patient, or of later complication, without mention of misadventure at the time of the procedure: Secondary | ICD-10-CM | POA: Insufficient documentation

## 2024-12-04 DIAGNOSIS — Z95820 Peripheral vascular angioplasty status with implants and grafts: Secondary | ICD-10-CM | POA: Insufficient documentation

## 2024-12-06 NOTE — Procedures (Signed)
 The left chest was prepped and draped in the usual sterile manner.  Anesthesia was obtained 1% lidocaine  locally.  A 1 cm incision was performed with the puncture device.  Lux-DX II loop monitor [facility] [employer] [phone]) was implanted with the delivery device.  Steri-Strips and Tegaderm were applied.  There were no periprocedural complications.  R waves measured 0.48 V.

## 2024-12-07 ENCOUNTER — Emergency Department

## 2024-12-07 ENCOUNTER — Encounter: Payer: Self-pay | Admitting: Emergency Medicine

## 2024-12-07 ENCOUNTER — Inpatient Hospital Stay

## 2024-12-07 ENCOUNTER — Other Ambulatory Visit: Payer: Self-pay

## 2024-12-07 DIAGNOSIS — I700 Atherosclerosis of aorta: Secondary | ICD-10-CM | POA: Diagnosis present

## 2024-12-07 DIAGNOSIS — E1122 Type 2 diabetes mellitus with diabetic chronic kidney disease: Secondary | ICD-10-CM | POA: Diagnosis present

## 2024-12-07 DIAGNOSIS — Z5982 Transportation insecurity: Secondary | ICD-10-CM

## 2024-12-07 DIAGNOSIS — Z7902 Long term (current) use of antithrombotics/antiplatelets: Secondary | ICD-10-CM

## 2024-12-07 DIAGNOSIS — E1151 Type 2 diabetes mellitus with diabetic peripheral angiopathy without gangrene: Secondary | ICD-10-CM | POA: Diagnosis present

## 2024-12-07 DIAGNOSIS — Z1152 Encounter for screening for COVID-19: Secondary | ICD-10-CM | POA: Diagnosis not present

## 2024-12-07 DIAGNOSIS — Z79899 Other long term (current) drug therapy: Secondary | ICD-10-CM

## 2024-12-07 DIAGNOSIS — I959 Hypotension, unspecified: Secondary | ICD-10-CM | POA: Diagnosis present

## 2024-12-07 DIAGNOSIS — I213 ST elevation (STEMI) myocardial infarction of unspecified site: Secondary | ICD-10-CM | POA: Diagnosis present

## 2024-12-07 DIAGNOSIS — Z515 Encounter for palliative care: Secondary | ICD-10-CM | POA: Diagnosis not present

## 2024-12-07 DIAGNOSIS — E11319 Type 2 diabetes mellitus with unspecified diabetic retinopathy without macular edema: Secondary | ICD-10-CM | POA: Diagnosis present

## 2024-12-07 DIAGNOSIS — Z794 Long term (current) use of insulin: Secondary | ICD-10-CM

## 2024-12-07 DIAGNOSIS — I132 Hypertensive heart and chronic kidney disease with heart failure and with stage 5 chronic kidney disease, or end stage renal disease: Secondary | ICD-10-CM | POA: Diagnosis present

## 2024-12-07 DIAGNOSIS — I255 Ischemic cardiomyopathy: Secondary | ICD-10-CM | POA: Diagnosis present

## 2024-12-07 DIAGNOSIS — N2581 Secondary hyperparathyroidism of renal origin: Secondary | ICD-10-CM | POA: Diagnosis present

## 2024-12-07 DIAGNOSIS — E785 Hyperlipidemia, unspecified: Secondary | ICD-10-CM | POA: Diagnosis present

## 2024-12-07 DIAGNOSIS — I2510 Atherosclerotic heart disease of native coronary artery without angina pectoris: Secondary | ICD-10-CM | POA: Diagnosis present

## 2024-12-07 DIAGNOSIS — I5032 Chronic diastolic (congestive) heart failure: Secondary | ICD-10-CM | POA: Diagnosis present

## 2024-12-07 DIAGNOSIS — H548 Legal blindness, as defined in USA: Secondary | ICD-10-CM | POA: Diagnosis present

## 2024-12-07 DIAGNOSIS — Z992 Dependence on renal dialysis: Secondary | ICD-10-CM

## 2024-12-07 DIAGNOSIS — I469 Cardiac arrest, cause unspecified: Principal | ICD-10-CM

## 2024-12-07 DIAGNOSIS — G4730 Sleep apnea, unspecified: Secondary | ICD-10-CM | POA: Diagnosis present

## 2024-12-07 DIAGNOSIS — I4891 Unspecified atrial fibrillation: Secondary | ICD-10-CM | POA: Diagnosis present

## 2024-12-07 DIAGNOSIS — Z951 Presence of aortocoronary bypass graft: Secondary | ICD-10-CM

## 2024-12-07 DIAGNOSIS — Z7982 Long term (current) use of aspirin: Secondary | ICD-10-CM

## 2024-12-07 DIAGNOSIS — Z7901 Long term (current) use of anticoagulants: Secondary | ICD-10-CM

## 2024-12-07 DIAGNOSIS — Z604 Social exclusion and rejection: Secondary | ICD-10-CM | POA: Diagnosis present

## 2024-12-07 DIAGNOSIS — I498 Other specified cardiac arrhythmias: Secondary | ICD-10-CM | POA: Diagnosis present

## 2024-12-07 DIAGNOSIS — Z66 Do not resuscitate: Secondary | ICD-10-CM | POA: Diagnosis present

## 2024-12-07 DIAGNOSIS — I252 Old myocardial infarction: Secondary | ICD-10-CM

## 2024-12-07 DIAGNOSIS — D631 Anemia in chronic kidney disease: Secondary | ICD-10-CM | POA: Diagnosis present

## 2024-12-07 DIAGNOSIS — Z8673 Personal history of transient ischemic attack (TIA), and cerebral infarction without residual deficits: Secondary | ICD-10-CM

## 2024-12-07 DIAGNOSIS — I4510 Unspecified right bundle-branch block: Secondary | ICD-10-CM | POA: Diagnosis present

## 2024-12-07 DIAGNOSIS — F419 Anxiety disorder, unspecified: Secondary | ICD-10-CM | POA: Diagnosis present

## 2024-12-07 DIAGNOSIS — N186 End stage renal disease: Secondary | ICD-10-CM | POA: Diagnosis present

## 2024-12-07 LAB — TROPONIN T, HIGH SENSITIVITY: Troponin T High Sensitivity: 120 ng/L (ref 0–19)

## 2024-12-07 LAB — CBC WITH DIFFERENTIAL/PLATELET
Abs Immature Granulocytes: 0.32 K/uL — ABNORMAL HIGH (ref 0.00–0.07)
Basophils Absolute: 0.1 K/uL (ref 0.0–0.1)
Basophils Relative: 1 %
Eosinophils Absolute: 0.3 K/uL (ref 0.0–0.5)
Eosinophils Relative: 2 %
HCT: 34.4 % — ABNORMAL LOW (ref 36.0–46.0)
Hemoglobin: 10.9 g/dL — ABNORMAL LOW (ref 12.0–15.0)
Immature Granulocytes: 2 %
Lymphocytes Relative: 24 %
Lymphs Abs: 3.9 K/uL (ref 0.7–4.0)
MCH: 29.9 pg (ref 26.0–34.0)
MCHC: 31.7 g/dL (ref 30.0–36.0)
MCV: 94.5 fL (ref 80.0–100.0)
Monocytes Absolute: 0.8 K/uL (ref 0.1–1.0)
Monocytes Relative: 5 %
Neutro Abs: 10.7 K/uL — ABNORMAL HIGH (ref 1.7–7.7)
Neutrophils Relative %: 66 %
Platelets: 252 K/uL (ref 150–400)
RBC: 3.64 MIL/uL — ABNORMAL LOW (ref 3.87–5.11)
RDW: 14.9 % (ref 11.5–15.5)
WBC: 16.2 K/uL — ABNORMAL HIGH (ref 4.0–10.5)
nRBC: 0.1 % (ref 0.0–0.2)

## 2024-12-07 LAB — BLOOD GAS, ARTERIAL
Acid-base deficit: 7.3 mmol/L — ABNORMAL HIGH (ref 0.0–2.0)
Bicarbonate: 17.1 mmol/L — ABNORMAL LOW (ref 20.0–28.0)
FIO2: 80 %
MECHVT: 450 mL
Mechanical Rate: 20
O2 Saturation: 98.6 %
PEEP: 5 cmH2O
Patient temperature: 37
pCO2 arterial: 31 mmHg — ABNORMAL LOW (ref 32–48)
pH, Arterial: 7.35 (ref 7.35–7.45)
pO2, Arterial: 163 mmHg — ABNORMAL HIGH (ref 83–108)

## 2024-12-07 LAB — LACTIC ACID, PLASMA: Lactic Acid, Venous: 5.9 mmol/L (ref 0.5–1.9)

## 2024-12-07 LAB — RESP PANEL BY RT-PCR (RSV, FLU A&B, COVID)  RVPGX2
Influenza A by PCR: NEGATIVE
Influenza B by PCR: NEGATIVE
Resp Syncytial Virus by PCR: NEGATIVE
SARS Coronavirus 2 by RT PCR: NEGATIVE

## 2024-12-07 LAB — COMPREHENSIVE METABOLIC PANEL WITH GFR
ALT: 113 U/L — ABNORMAL HIGH (ref 0–44)
AST: 128 U/L — ABNORMAL HIGH (ref 15–41)
Albumin: 4.3 g/dL (ref 3.5–5.0)
Alkaline Phosphatase: 46 U/L (ref 38–126)
Anion gap: 18 — ABNORMAL HIGH (ref 5–15)
BUN: 14 mg/dL (ref 8–23)
CO2: 23 mmol/L (ref 22–32)
Calcium: 9.8 mg/dL (ref 8.9–10.3)
Chloride: 100 mmol/L (ref 98–111)
Creatinine, Ser: 2.12 mg/dL — ABNORMAL HIGH (ref 0.44–1.00)
GFR, Estimated: 26 mL/min — ABNORMAL LOW
Glucose, Bld: 223 mg/dL — ABNORMAL HIGH (ref 70–99)
Potassium: 3 mmol/L — ABNORMAL LOW (ref 3.5–5.1)
Sodium: 141 mmol/L (ref 135–145)
Total Bilirubin: 0.5 mg/dL (ref 0.0–1.2)
Total Protein: 7.4 g/dL (ref 6.5–8.1)

## 2024-12-07 LAB — CBG MONITORING, ED: Glucose-Capillary: 209 mg/dL — ABNORMAL HIGH (ref 70–99)

## 2024-12-07 LAB — MAGNESIUM: Magnesium: 2.3 mg/dL (ref 1.7–2.4)

## 2024-12-07 LAB — PRO BRAIN NATRIURETIC PEPTIDE: Pro Brain Natriuretic Peptide: 4593 pg/mL — ABNORMAL HIGH

## 2024-12-07 LAB — MRSA NEXT GEN BY PCR, NASAL: MRSA by PCR Next Gen: NOT DETECTED

## 2024-12-07 LAB — GLUCOSE, CAPILLARY: Glucose-Capillary: 454 mg/dL — ABNORMAL HIGH (ref 70–99)

## 2024-12-07 MED ORDER — SODIUM CHLORIDE 0.9 % IV SOLN: INTRAVENOUS | Status: DC

## 2024-12-07 MED ORDER — SUCCINYLCHOLINE CHLORIDE 20 MG/ML IJ SOLN
INTRAMUSCULAR | Status: AC | PRN
  Administered 2024-12-07: 100 mg via INTRAVENOUS

## 2024-12-07 MED ORDER — MORPHINE 100MG IN NS 100ML (1MG/ML) PREMIX INFUSION: 0.0000 mg/h | INTRAVENOUS | Status: DC

## 2024-12-07 MED ORDER — ETOMIDATE 2 MG/ML IV SOLN
INTRAVENOUS | Status: AC | PRN
  Administered 2024-12-07: 20 mg via INTRAVENOUS

## 2024-12-07 MED ORDER — MIDAZOLAM-SODIUM CHLORIDE 100-0.9 MG/100ML-% IV SOLN
0.5000 mg/h | INTRAVENOUS | Status: DC
  Administered 2024-12-07: 1 mg/h via INTRAVENOUS

## 2024-12-07 MED ORDER — LACTATED RINGERS IV BOLUS
1000.0000 mL | Freq: Once | INTRAVENOUS | Status: AC
  Administered 2024-12-07: 1000 mL via INTRAVENOUS

## 2024-12-07 MED ORDER — PROPOFOL 10 MG/ML IV BOLUS
INTRAVENOUS | Status: AC
  Filled 2024-12-07: qty 20

## 2024-12-07 MED ORDER — ACETAMINOPHEN 325 MG PO TABS: 650.0000 mg | ORAL_TABLET | Freq: Four times a day (QID) | ORAL | Status: DC | PRN

## 2024-12-07 MED ORDER — POLYVINYL ALCOHOL 1.4 % OP SOLN: 1.0000 [drp] | Freq: Four times a day (QID) | OPHTHALMIC | Status: DC | PRN

## 2024-12-07 MED ORDER — VASOPRESSIN 20 UNITS/100 ML INFUSION FOR SHOCK
0.0000 [IU]/min | INTRAVENOUS | Status: DC
  Filled 2024-12-07: qty 100

## 2024-12-07 MED ORDER — CALCIUM CHLORIDE 10 % IV SOLN
INTRAVENOUS | Status: AC
  Filled 2024-12-07: qty 10

## 2024-12-07 MED ORDER — POTASSIUM CHLORIDE 10 MEQ/100ML IV SOLN
10.0000 meq | INTRAVENOUS | Status: DC
  Administered 2024-12-07: 10 meq via INTRAVENOUS

## 2024-12-07 MED ORDER — ETOMIDATE 2 MG/ML IV SOLN
INTRAVENOUS | Status: AC
  Filled 2024-12-07: qty 10

## 2024-12-07 MED ORDER — MORPHINE SULFATE (PF) 4 MG/ML IV SOLN
INTRAVENOUS | Status: AC
  Filled 2024-12-07: qty 1

## 2024-12-07 MED ORDER — SUCCINYLCHOLINE CHLORIDE 200 MG/10ML IV SOSY
PREFILLED_SYRINGE | INTRAVENOUS | Status: AC
  Filled 2024-12-07: qty 10

## 2024-12-07 MED ORDER — GLYCOPYRROLATE 1 MG PO TABS: 1.0000 mg | ORAL_TABLET | ORAL | Status: DC | PRN

## 2024-12-07 MED ORDER — DOPAMINE-DEXTROSE 3.2-5 MG/ML-% IV SOLN
0.0000 ug/kg/min | INTRAVENOUS | Status: DC
  Administered 2024-12-07: 5 ug/kg/min via INTRAVENOUS

## 2024-12-07 MED ORDER — GLYCOPYRROLATE 0.2 MG/ML IJ SOLN: 0.2000 mg | INTRAMUSCULAR | Status: DC | PRN

## 2024-12-07 MED ORDER — IOHEXOL 350 MG/ML SOLN
75.0000 mL | Freq: Once | INTRAVENOUS | Status: AC | PRN
  Administered 2024-12-07: 75 mL via INTRAVENOUS

## 2024-12-07 MED ORDER — HYDROCORTISONE SOD SUC (PF) 100 MG IJ SOLR
100.0000 mg | Freq: Two times a day (BID) | INTRAMUSCULAR | Status: DC
  Administered 2024-12-07: 100 mg via INTRAVENOUS
  Filled 2024-12-07: qty 2

## 2024-12-07 MED ORDER — SODIUM CHLORIDE 0.9 % IV SOLN
2.0000 g | Freq: Once | INTRAVENOUS | Status: AC
  Administered 2024-12-07: 2 g via INTRAVENOUS
  Filled 2024-12-07: qty 12.5

## 2024-12-07 MED ORDER — VANCOMYCIN HCL IN DEXTROSE 1-5 GM/200ML-% IV SOLN
1000.0000 mg | Freq: Once | INTRAVENOUS | Status: DC
  Filled 2024-12-07: qty 200

## 2024-12-07 MED ORDER — SODIUM BICARBONATE 8.4 % IV SOLN
50.0000 meq | Freq: Once | INTRAVENOUS | Status: AC
  Administered 2024-12-07: 50 meq via INTRAVENOUS
  Filled 2024-12-07: qty 50

## 2024-12-07 MED ORDER — VANCOMYCIN HCL 1500 MG/300ML IV SOLN
1500.0000 mg | Freq: Once | INTRAVENOUS | Status: DC
  Filled 2024-12-07: qty 300

## 2024-12-07 MED ORDER — ATROPINE SULFATE 1 MG/10ML IJ SOSY
1.0000 mg | PREFILLED_SYRINGE | Freq: Once | INTRAMUSCULAR | Status: AC
  Administered 2024-12-07: 1 mg via INTRAVENOUS

## 2024-12-07 MED ORDER — DOCUSATE SODIUM 100 MG PO CAPS: 100.0000 mg | ORAL_CAPSULE | Freq: Two times a day (BID) | ORAL | Status: DC | PRN

## 2024-12-07 MED ORDER — PIPERACILLIN-TAZOBACTAM IN DEX 2-0.25 GM/50ML IV SOLN
2.2500 g | Freq: Three times a day (TID) | INTRAVENOUS | Status: DC
  Filled 2024-12-07 (×2): qty 50

## 2024-12-07 MED ORDER — MAGNESIUM SULFATE 50 % IJ SOLN
INTRAMUSCULAR | Status: AC | PRN
  Administered 2024-12-07: 2 g via INTRAVENOUS

## 2024-12-07 MED ORDER — MORPHINE BOLUS VIA INFUSION: 5.0000 mg | INTRAVENOUS | Status: DC | PRN

## 2024-12-07 MED ORDER — NOREPINEPHRINE BITARTRATE 1 MG/ML IV SOLN
INTRAVENOUS | Status: DC | PRN
  Administered 2024-12-07: 10 ug via INTRAVENOUS

## 2024-12-07 MED ORDER — EPINEPHRINE 1 MG/10ML IV SOSY
PREFILLED_SYRINGE | INTRAVENOUS | Status: AC | PRN
  Administered 2024-12-07: 1 mg via INTRAVENOUS

## 2024-12-07 MED ORDER — HYDROCORTISONE SOD SUC (PF) 100 MG IJ SOLR: 50.0000 mg | Freq: Four times a day (QID) | INTRAMUSCULAR | Status: DC

## 2024-12-07 MED ORDER — VANCOMYCIN HCL IN DEXTROSE 1-5 GM/200ML-% IV SOLN: 1000.0000 mg | Freq: Once | INTRAVENOUS | Status: DC

## 2024-12-07 MED ORDER — POLYETHYLENE GLYCOL 3350 17 G PO PACK: 17.0000 g | PACK | Freq: Every day | ORAL | Status: DC | PRN

## 2024-12-07 MED ORDER — NOREPINEPHRINE 4 MG/250ML-% IV SOLN
0.0000 ug/min | INTRAVENOUS | Status: DC
  Administered 2024-12-07: 50 ug/min via INTRAVENOUS
  Administered 2024-12-07: 15 ug/min via INTRAVENOUS
  Administered 2024-12-07: 34 ug/min via INTRAVENOUS
  Filled 2024-12-07 (×2): qty 250

## 2024-12-07 MED ORDER — ACETAMINOPHEN 650 MG RE SUPP: 650.0000 mg | Freq: Four times a day (QID) | RECTAL | Status: DC | PRN

## 2024-12-07 MED ORDER — CHLORHEXIDINE GLUCONATE CLOTH 2 % EX PADS
6.0000 | MEDICATED_PAD | Freq: Every day | CUTANEOUS | Status: DC
  Administered 2024-12-07: 6 via TOPICAL
  Filled 2024-12-07: qty 6

## 2024-12-07 MED ORDER — EPINEPHRINE 1 MG/10ML IV SOSY
PREFILLED_SYRINGE | INTRAVENOUS | Status: AC | PRN
  Administered 2024-12-07 (×3): 1 mg via INTRAVENOUS

## 2024-12-07 MED ORDER — MIDAZOLAM-SODIUM CHLORIDE 100-0.9 MG/100ML-% IV SOLN
INTRAVENOUS | Status: AC
  Filled 2024-12-07: qty 100

## 2024-12-07 MED ORDER — POTASSIUM CHLORIDE 10 MEQ/100ML IV SOLN
10.0000 meq | INTRAVENOUS | Status: DC
  Filled 2024-12-07 (×3): qty 100

## 2024-12-07 MED ORDER — EPINEPHRINE 1 MG/10ML IV SOSY
PREFILLED_SYRINGE | INTRAVENOUS | Status: AC
  Filled 2024-12-07: qty 10

## 2024-12-11 ENCOUNTER — Encounter: Admitting: Physician Assistant

## 2024-12-20 NOTE — Progress Notes (Addendum)
 RT assisted provider with intubation at 1203 with no complications.  Patient was intubated with video laryngoscope (S4) and a 7.5 endotracheal tube.  CO2 detection was positive and bilateral breath sounds were confirmed.  Tube was secured at 24 cm measured from the lip.

## 2024-12-20 NOTE — Consult Note (Signed)
 PHARMACY CONSULT NOTE - ELECTROLYTES  Pharmacy Consult for Electrolyte Monitoring and Replacement   Recent Labs: Height: 5' 7 (170.2 cm) IBW/kg (Calculated) : 61.6 CrCl cannot be calculated (Unknown ideal weight.). Potassium (mmol/L)  Date Value  11/26/2024 3.0 (L)   Magnesium  (mg/dL)  Date Value  87/80/7974 2.3   Calcium  (mg/dL)  Date Value  87/80/7974 9.8   Albumin (g/dL)  Date Value  87/80/7974 4.3   Phosphorus (mg/dL)  Date Value  90/74/7974 5.2 (H)   Sodium (mmol/L)  Date Value  12/01/2024 141    Assessment  Sharon Arias is a 64 y.o. female presenting after CODE at dialysis. PMH significant for CABD in 2013, HTN, DM, HLD, ESRD, CVA in 2024, HF, and OS on CPAP. Pharmacy has been consulted to monitor and replace electrolytes.  Diet: npo  Goal of Therapy: Electrolytes WNL  Plan:  K 3.0 on pressors and s/p rapid intubation. Will replace with KCl IX x 4 doses and follow up electrolytes with AM labs. Will replace with caution due to ESRD.  Check BMP, Mg, Phos with AM labs  Thank you for allowing pharmacy to be a part of this patient's care.  Alexzander Dolinger Rodriguez-Guzman PharmD, BCPS 12/19/2024 2:49 PM

## 2024-12-20 NOTE — ED Triage Notes (Signed)
 Pt to ED via [facility]. CPR in progress. Pt was at dialysis. Pt got all but 30 minutes of her dialysis treatment. Pt coded at dialysis and has been in PEA with EMS. Pt got a total of 3 rounds of Epi, 1 Bicarb, and 1 Calcium . Pt arrives with iGel in place and being bagged with BVM. Pt has bilateral tibial IO's.  EMS reports that pt has had eye movement and seizure like activity. Pulse check on arrival. Pt in PEA, CPR continued.

## 2024-12-20 NOTE — Progress Notes (Signed)
 Pt transported to the morgue

## 2024-12-20 NOTE — Code Documentation (Signed)
CBG 209. 

## 2024-12-20 NOTE — Consult Note (Signed)
 Pharmacy Antibiotic Note  Sharon Arias is a 64 y.o. female admitted on 12/19/2024 with sepsis.  Pharmacy has been consulted for Zosyn  and Vancomycin  dosing.  Patient coded while on HD. Little information available. Will order loading dose and   Plan: Zosyn  2.25gm IV q 8hrs (ESRD on HD) Vancomycin  2500 mg IV LD (1500mg  followed by 1000mg ). No further doses until HD regimen available.  Height: 5' 7 (170.2 cm) Weight: 113.1 kg (249 lb 5.4 oz) IBW/kg (Calculated) : 61.6  Temp (24hrs), Avg:97.2 F (36.2 C), Min:95.1 F (35.1 C), Max:97.7 F (36.5 C)  Recent Labs  Lab 11/25/2024 1200  WBC 16.2*  CREATININE 2.12*    Estimated Creatinine Clearance: 35.2 mL/min (A) (by C-G formula based on SCr of 2.12 mg/dL (H)).    Allergies[1]  Antimicrobials this admission: 12/19 Vancomycin  >>  12/19 Zosyn  >>     Thank you for allowing pharmacy to be a part of this patients care.  Candy Leverett Rodriguez-Guzman PharmD, BCPS 11/20/2024 4:10 PM     [1]  Allergies Allergen Reactions   Ozempic (0.25 Or 0.5 Mg-Dose) [Semaglutide(0.25 Or 0.5mg -Dos)] Diarrhea and Nausea Only   Trulicity [Dulaglutide] Diarrhea and Nausea Only   Other     Anesthesia--nausea/vomiting

## 2024-12-20 NOTE — Progress Notes (Signed)
 Spoke to son and daughter and they agree to manage her conservatively with comfort care.

## 2024-12-20 NOTE — Code Documentation (Signed)
 20 of Etomidate  100 of Succ

## 2024-12-20 NOTE — Code Documentation (Signed)
 Versed  4 mg bolus  given

## 2024-12-20 NOTE — Death Summary Note (Signed)
 " DEATH SUMMARY   Patient Details  Name: Sharon Arias MRN: [id number] DOB: [birth year]  Admission/Discharge Information   Admit Date:  01/04/2025  Date of Death:  2025/01/04   Time of Death:  January 14, 1659  Length of Stay: 0  Referring Physician: Auston Reyes BIRCH, MD   Reason(s) for Hospitalization  STEMI  Diagnoses  Preliminary cause of death: ischemic cardiomyopathy Secondary Diagnoses (including complications and co-morbidities):  Principal Problem:   Cardiac arrest [facility])   Brief Hospital Course (including significant findings, care, treatment, and services provided and events leading to death)  64 year old white female with a past medical history of end-stage renal disease on dialysis, CABG, CVA and hypertension and hyperlipidemia presented as an outpatient for dialysis which she had a cardiac arrest. She had CPR for more than 40 minutes and was intubated and brought to the emergency room. In the emergency room her initial EKG showed atrial fibrillation with controlled ventricular rate with 4 to 5 mm ST elevation in the inferior leads with reciprocal ST depression in V1-V3 suggestive of right ventricular involvement as well as 3 mm ST elevation in lateral leads V5 to V6. Follow-up EKG showed junctional rhythm with right bundle branch block. Because patient coded for like 40 minutes the family decided to make the patient DNR. At this time patient is hypotensive and is in junctional rhythm about 40 bpm on Levophed  and unresponsive.    GOALS OF CARE DISCUSSION   The Clinical status was relayed to family in detail-Daughter and Son at bedside   Updated and notified of patients medical condition- Patient remains unresponsive and will not open eyes to command.   Patient with increased WOB and using accessory muscles to breathe Explained to family course of therapy and the modalities  Patient with Progressive multiorgan failure with a very high probablity of a very minimal chance of  meaningful recovery despite all aggressive and optimal medical therapy.     Family understands the situation.   They have consented and agreed to DNR/DNI and would like to proceed with Comfort care measures.   Family are satisfied with Plan of action and management. All questions answered   Pertinent Labs and Studies  Significant Diagnostic Studies CT Angio Chest Pulmonary Embolism (PE) W or WO Contrast Result Date: 2025-01-04 EXAM: CTA CHEST January 04, 2025 03:53:41 PM TECHNIQUE: CTA of the chest was performed without and with the administration of 75 mL of intravenous iohexol  (OMNIPAQUE ) 350 MG/ML injection. Multiplanar reformatted images are provided for review. MIP images are provided for review. Automated exposure control, iterative reconstruction, and/or weight based adjustment of the mA/kV was utilized to reduce the radiation dose to as low as reasonably achievable. COMPARISON: CT chest 05/20/2020. CLINICAL HISTORY: Pulmonary embolism (PE) suspected, high prob. FINDINGS: PULMONARY ARTERIES: Pulmonary arteries are adequately opacified for evaluation. No acute pulmonary embolus. Main pulmonary artery is normal in caliber. MEDIASTINUM: The heart is moderately enlarged. Patient is status post cardiac surgery. Coronary and aortic atherosclerotic calcifications are noted. There is no acute abnormality of the thoracic aorta. There is an enteric tube throughout the esophagus. Endotracheal tube tip is 2 cm above the carina. LYMPH NODES: An enlarged right hilar lymph node measuring up to 1 cm. There are enlarged left hilar lymph nodes measuring up to 11 mm. LUNGS AND PLEURA: There is dependent atelectasis in the bilateral lower lobes. No focal consolidation or pulmonary edema. No evidence of pleural effusion or pneumothorax. UPPER ABDOMEN: Limited images of the upper abdomen are unremarkable. SOFT TISSUES AND BONES:  No acute bone or soft tissue abnormality. IMPRESSION: 1. No pulmonary embolism. 2. Mild  bilateral hilar lymphadenopathy. 3. Moderate cardiomegaly, status post cardiac surgery. 4. Endotracheal tube tip approximately 2 cm above the carina and enteric tube coursing through the esophagus. Electronically signed by: Greig Pique MD 01-05-2025 04:41 PM EST RP Workstation: [id number]   CT HEAD WO CONTRAST ( ) Result Date: 05-Jan-2025 EXAM: CT HEAD WITHOUT CONTRAST Jan 05, 2025 03:53:41 PM TECHNIQUE: CT of the head was performed without the administration of intravenous contrast. Automated exposure control, iterative reconstruction, and/or weight based adjustment of the mA/kV was utilized to reduce the radiation dose to as low as reasonably achievable. COMPARISON: 05/10/2024 CLINICAL HISTORY: Cardiac arrest pulseless electrical activity. FINDINGS: BRAIN AND VENTRICLES: No acute hemorrhage. Stable chronic right parietal lobe infarct. No evidence of acute infarct. No hydrocephalus. No extra-axial collection. No mass effect or midline shift. Partially visualized support tubes. ORBITS: Bilateral lens replacement noted. SINUSES: Air-fluid level in right maxillary sinus. Mild mucosal thickening in left sphenoid sinus. SOFT TISSUES AND SKULL: No acute soft tissue abnormality. No skull fracture. IMPRESSION: 1. No acute intracranial abnormality. Electronically signed by: Lonni Necessary MD 01-05-25 04:05 PM EST RP Workstation: [id number]   DG Chest Portable 1 View Result Date: 01-05-25 CLINICAL DATA:  Status post intubation. EXAM: PORTABLE CHEST 1 VIEW COMPARISON:  September 10, 2024 FINDINGS: Since the prior study there is been interval placement of an endotracheal tube with its distal tip approximately 2.3 cm from the carina. Multiple sternal wires and vascular clips are seen. The cardiac silhouette is enlarged and unchanged in size. A radiopaque loop recorder device is in place. Low lung volumes are noted with mild prominence of the perihilar pulmonary vasculature. Areas of right suprahilar and right  infrahilar atelectasis and/or infiltrate are suspected. The visualized skeletal structures are unremarkable. IMPRESSION: 1. Interval endotracheal tube placement, as described above. 2. Stable cardiomegaly and evidence of prior median sternotomy/CABG with mild pulmonary vascular congestion. 3. Right suprahilar and right infrahilar atelectasis and/or infiltrate. Electronically Signed   By: Suzen Dials M.D.   On: 01/05/25 14:30    Microbiology Recent Results (from the past 240 hours)  Resp panel by RT-PCR (RSV, Flu A&B, Covid) Anterior Nasal Swab     Status: None   Collection Time: Jan 05, 2025  2:54 PM   Specimen: Anterior Nasal Swab  Result Value Ref Range Status   SARS Coronavirus 2 by RT PCR NEGATIVE NEGATIVE Final    Comment: (NOTE) SARS-CoV-2 target nucleic acids are NOT DETECTED.  The SARS-CoV-2 RNA is generally detectable in upper respiratory specimens during the acute phase of infection. The lowest concentration of SARS-CoV-2 viral copies this assay can detect is 138 copies/mL. A negative result does not preclude SARS-Cov-2 infection and should not be used as the sole basis for treatment or other patient management decisions. A negative result may occur with  improper specimen collection/handling, submission of specimen other than nasopharyngeal swab, presence of viral mutation(s) within the areas targeted by this assay, and inadequate number of viral copies(<138 copies/mL). A negative result must be combined with clinical observations, patient history, and epidemiological information. The expected result is Negative.  Fact Sheet for Patients:  bloggercourse.com  Fact Sheet for Healthcare Providers:  seriousbroker.it  This test is no t yet approved or cleared by the [place]  FDA and  has been authorized for detection and/or diagnosis of SARS-CoV-2 by FDA under an Emergency Use Authorization (EUA). This EUA will remain   in effect (meaning this test can be used)  for the duration of the COVID-19 declaration under Section 564(b)(1) of the Act, 21 U.S.C.section 360bbb-3(b)(1), unless the authorization is terminated  or revoked sooner.       Influenza A by PCR NEGATIVE NEGATIVE Final   Influenza B by PCR NEGATIVE NEGATIVE Final    Comment: (NOTE) The Xpert Xpress SARS-CoV-2/FLU/RSV plus assay is intended as an aid in the diagnosis of influenza from Nasopharyngeal swab specimens and should not be used as a sole basis for treatment. Nasal washings and aspirates are unacceptable for Xpert Xpress SARS-CoV-2/FLU/RSV testing.  Fact Sheet for Patients: bloggercourse.com  Fact Sheet for Healthcare Providers: seriousbroker.it  This test is not yet approved or cleared by the [place]  FDA and has been authorized for detection and/or diagnosis of SARS-CoV-2 by FDA under an Emergency Use Authorization (EUA). This EUA will remain in effect (meaning this test can be used) for the duration of the COVID-19 declaration under Section 564(b)(1) of the Act, 21 U.S.C. section 360bbb-3(b)(1), unless the authorization is terminated or revoked.     Resp Syncytial Virus by PCR NEGATIVE NEGATIVE Final    Comment: (NOTE) Fact Sheet for Patients: bloggercourse.com  Fact Sheet for Healthcare Providers: seriousbroker.it  This test is not yet approved or cleared by the [place]  FDA and has been authorized for detection and/or diagnosis of SARS-CoV-2 by FDA under an Emergency Use Authorization (EUA). This EUA will remain in effect (meaning this test can be used) for the duration of the COVID-19 declaration under Section 564(b)(1) of the Act, 21 U.S.C. section 360bbb-3(b)(1), unless the authorization is terminated or revoked.  Performed at [facility], [address] [place], [place] [address]   MRSA  Next Gen by PCR, Nasal     Status: None   Collection Time: 12-22-24  2:54 PM   Specimen: Nasal Mucosa; Nasal Swab  Result Value Ref Range Status   MRSA by PCR Next Gen NOT DETECTED NOT DETECTED Final    Comment: (NOTE) The GeneXpert MRSA Assay (FDA approved for NASAL specimens only), is one component of a comprehensive MRSA colonization surveillance program. It is not intended to diagnose MRSA infection nor to guide or monitor treatment for MRSA infections. Test performance is not FDA approved in patients less than 22 years old. Performed at [facility], [address] [place], [place] [address]     Lab Basic Metabolic Panel: Recent Labs  Lab 12/22/2024 1200  NA 141  K 3.0*  CL 100  CO2 23  GLUCOSE 223*  BUN 14  CREATININE 2.12*  CALCIUM  9.8  MG 2.3   Liver Function Tests: Recent Labs  Lab 12-22-2024 1200  AST 128*  ALT 113*  ALKPHOS 46  BILITOT 0.5  PROT 7.4  ALBUMIN 4.3   No results for input(s): LIPASE, AMYLASE in the last 168 hours. No results for input(s): AMMONIA in the last 168 hours. CBC: Recent Labs  Lab 12-22-2024 1200  WBC 16.2*  NEUTROABS 10.7*  HGB 10.9*  HCT 34.4*  MCV 94.5  PLT 252   Cardiac Enzymes: No results for input(s): CKTOTAL, CKMB, CKMBINDEX, TROPONINI in the last 168 hours. Sepsis Labs: Recent Labs  Lab 12-22-2024 1200 12-22-24 1206  WBC 16.2*  --   LATICACIDVEN  --  5.9*      Janari Yamada 12-22-24, 5:01 PM   "

## 2024-12-20 NOTE — IPAL (Signed)
" °  Interdisciplinary Goals of Care Family Meeting   Date carried out: Dec 12, 2024  Location of the meeting: Bedside  Member's involved: Physician, Nurse Practitioner, Bedside Registered Nurse, and Family Member or next of kin    GOALS OF CARE DISCUSSION  The Clinical status was relayed to family in detail-Daughter and Son at bedside  Updated and notified of patients medical condition- Patient remains unresponsive and will not open eyes to command.   Patient with increased WOB and using accessory muscles to breathe Explained to family course of therapy and the modalities  Patient with Progressive multiorgan failure with a very high probablity of a very minimal chance of meaningful recovery despite all aggressive and optimal medical therapy.    Family understands the situation.  They have consented and agreed to DNR/DNI and would like to proceed with Comfort care measures.  Family are satisfied with Plan of action and management. All questions answered  Additional CC time 35 mins   Danen Lapaglia Alm Cellar, M.D.  [facility]  Medical Director [facility] Medical Director [facility] Cardio-Pulmonary Department      "

## 2024-12-20 NOTE — ED Provider Notes (Signed)
 "  [facility] Provider Note    Event Date/Time   First MD Initiated Contact with Patient 11/28/2024 1214     (approximate)   History   Cardiac Arrest (Coded at dailysis)   HPI  Sharon Arias is a 64 y.o. female with a history of ESRD on HD, PAD, type 2 diabetes, hypertension, HFpEF, CAD, and anemia who presents in cardiac arrest.  Per EMS, the patient was receiving dialysis and had only partly completed her session when she was found to be unresponsive and without a pulse.  EMS found her to be in PEA.  No shocks were given during transport.  The patient received 3 doses of epinephrine prior to coming to the hospital, with no ROSC.  I reviewed the past medical records.  The patient was seen by Dr. Ammon yesterday for implantation of a loop monitor which was uncomplicated.  She was admitted to the hospitalist service in September with osteomyelitis of the toe on her right foot.   Physical Exam   Triage Vital Signs: ED Triage Vitals  Encounter Vitals Group     BP 12/01/2024 1153 (!) 155/105     Girls Systolic BP Percentile --      Girls Diastolic BP Percentile --      Boys Systolic BP Percentile --      Boys Diastolic BP Percentile --      Pulse Rate 11/24/2024 1153 97     Resp 12/05/2024 1158 14     Temp 11/23/2024 1207 (!) 95.1 F (35.1 C)     Temp src --      SpO2 12/03/2024 1158 100 %     Weight --      Height 11/27/2024 1305 5' 7 (1.702 m)     Head Circumference --      Peak Flow --      Pain Score 11/22/2024 1219 0     Pain Loc --      Pain Education --      Exclude from Growth Chart --     Most recent vital signs: Vitals:   11/30/2024 1440 12/18/2024 1552  BP: (!) 89/33 (!) 119/99  Pulse: 76 (!) 46  Resp: (!) 21 18  Temp: (!) 97 F (36.1 C) (!) 96.8 F (36 C)  SpO2: 100% 99%     General: Unresponsive CV:  No pulse Resp:  Lungs clear when bagged.  Frothy sputum. Abd:  No distention.  Other:  Mild peripheral edema.  No visible trauma.   Pupils reactive.   ED Results / Procedures / Treatments   Labs (all labs ordered are listed, but only abnormal results are displayed) Labs Reviewed  CBC WITH DIFFERENTIAL/PLATELET - Abnormal; Notable for the following components:      Result Value   WBC 16.2 (*)    RBC 3.64 (*)    Hemoglobin 10.9 (*)    HCT 34.4 (*)    Neutro Abs 10.7 (*)    Abs Immature Granulocytes 0.32 (*)    All other components within normal limits  COMPREHENSIVE METABOLIC PANEL WITH GFR - Abnormal; Notable for the following components:   Potassium 3.0 (*)    Glucose, Bld 223 (*)    Creatinine, Ser 2.12 (*)    AST 128 (*)    ALT 113 (*)    GFR, Estimated 26 (*)    Anion gap 18 (*)    All other components within normal limits  BLOOD GAS, ARTERIAL - Abnormal; Notable for  the following components:   pCO2 arterial 31 (*)    pO2, Arterial 163 (*)    Bicarbonate 17.1 (*)    Acid-base deficit 7.3 (*)    All other components within normal limits  PRO BRAIN NATRIURETIC PEPTIDE - Abnormal; Notable for the following components:   Pro Brain Natriuretic Peptide 4,593.0 (*)    All other components within normal limits  GLUCOSE, CAPILLARY - Abnormal; Notable for the following components:   Glucose-Capillary 454 (*)    All other components within normal limits  CBG MONITORING, ED - Abnormal; Notable for the following components:   Glucose-Capillary 209 (*)    All other components within normal limits  TROPONIN T, HIGH SENSITIVITY - Abnormal; Notable for the following components:   Troponin T High Sensitivity 120 (*)    All other components within normal limits  RESP PANEL BY RT-PCR (RSV, FLU A&B, COVID)  RVPGX2  MRSA NEXT GEN BY PCR, NASAL  CULTURE, BLOOD (ROUTINE X 2)  CULTURE, BLOOD (ROUTINE X 2)  AEROBIC/ANAEROBIC CULTURE W GRAM STAIN (SURGICAL/DEEP WOUND)  MAGNESIUM   LACTIC ACID, PLASMA  LACTIC ACID, PLASMA  URINALYSIS, ROUTINE W REFLEX MICROSCOPIC  URINE DRUG SCREEN  HIV ANTIBODY (ROUTINE TESTING W  REFLEX)  TROPONIN T, HIGH SENSITIVITY     EKG  ED ECG REPORT I, Waylon Cassis, the attending physician, personally viewed and interpreted this ECG.  Date: 11/26/2024 EKG Time: 1400 Rate: 59 Rhythm: Atrial fibrillation QRS Axis: normal Intervals: RBBB ST/T Wave abnormalities: Nonspecific ST abnormalities Narrative Interpretation: no evidence of acute ischemia    RADIOLOGY  Chest x-ray: I independently viewed and interpreted the images; ET tube is in appropriate position.  There are no focal consolidations or edema.   PROCEDURES:  Critical Care performed: Yes, see critical care procedure note(s)  .Critical Care  Performed by: Cassis Waylon, MD Authorized by: Cassis Waylon, MD   Critical care provider statement:    Critical care time (minutes):  60   Critical care time was exclusive of:  Separately billable procedures and treating other patients   Critical care was necessary to treat or prevent imminent or life-threatening deterioration of the following conditions:  Cardiac failure, circulatory failure and shock   Critical care was time spent personally by me on the following activities:  Development of treatment plan with patient or surrogate, discussions with consultants, evaluation of patient's response to treatment, examination of patient, ordering and review of laboratory studies, ordering and review of radiographic studies, ordering and performing treatments and interventions, pulse oximetry, re-evaluation of patient's condition, review of old charts and obtaining history from patient or surrogate   Care discussed with: admitting provider   Procedure Name: Intubation Date/Time: 11/24/2024 4:19 PM  Performed by: Cassis Waylon, MDPre-anesthesia Checklist: Patient identified, Emergency Drugs available, Suction available and Patient being monitored Preoxygenation: Pre-oxygenation with 100% oxygen Induction Type: IV induction and Rapid  sequence Laryngoscope Size: Glidescope and 4 Tube size: 7.5 mm Number of attempts: 1 Placement Confirmation: ETT inserted through vocal cords under direct vision, CO2 detector and Breath sounds checked- equal and bilateral Tube secured with: ETT holder Dental Injury: Teeth and Oropharynx as per pre-operative assessment        MEDICATIONS ORDERED IN ED: Medications  succinylcholine  (ANECTINE ) 200 MG/10ML syringe (has no administration in time range)  etomidate (AMIDATE) 2 MG/ML injection (has no administration in time range)  midazolam -sodium chloride  100-0.9 MG/100ML-% infusion (has no administration in time range)  midazolam  (VERSED ) 100 mg/100 mL (1 mg/mL) premix infusion (7 mg/hr  Intravenous Transfusing/Transfer 11/27/2024 1521)  EPINEPHrine (ADRENALIN) 1 MG/10ML injection (has no administration in time range)  norepinephrine (LEVOPHED) 4mg  in 250mL (0.016 mg/mL) premix infusion (40 mcg/min Intravenous Transfusing/Transfer 11/30/2024 1521)  Chlorhexidine  Gluconate Cloth 2 % PADS 6 each (6 each Topical Given 12/11/2024 1601)  docusate sodium  (COLACE) capsule 100 mg (has no administration in time range)  polyethylene glycol (MIRALAX  / GLYCOLAX ) packet 17 g (has no administration in time range)  potassium chloride  10 mEq in 100 mL IVPB (has no administration in time range)  vasopressin (PITRESSIN) 20 Units in 100 mL (0.2 unit/mL) infusion-*FOR SHOCK* (has no administration in time range)  hydrocortisone sodium succinate (SOLU-CORTEF) 100 MG injection 100 mg (has no administration in time range)  piperacillin -tazobactam (ZOSYN ) IVPB 2.25 g (has no administration in time range)  vancomycin  (VANCOREADY) IVPB 1500 mg/300 mL (has no administration in time range)    Followed by  vancomycin  (VANCOCIN ) IVPB 1000 mg/200 mL premix (has no administration in time range)  DOPamine (INTROPIN) 800 mg in dextrose  5 % 250 mL (3.2 mg/mL) infusion (5 mcg/kg/min  113.1 kg Intravenous New Bag/Given 12/11/2024  1621)  EPINEPHrine (ADRENALIN) 1 MG/10ML injection (1 mg Intravenous Given 11/27/2024 1146)  EPINEPHrine (ADRENALIN) 1 MG/10ML injection (1 mg Intravenous Given 12/18/2024 1221)  etomidate (AMIDATE) injection (20 mg Intravenous Given 11/21/2024 1155)  succinylcholine  (ANECTINE ) injection (100 mg Intravenous Given 11/19/2024 1155)  magnesium  sulfate (IV Push/IM) injection (2 g Intravenous Given 11/22/2024 1204)  lactated ringers  bolus 1,000 mL (1,000 mLs Intravenous Transfusing/Transfer 11/21/2024 1521)  ceFEPIme  (MAXIPIME ) 2 g in sodium chloride  0.9 % 100 mL IVPB (0 g Intravenous Stopped 12/06/2024 1601)  sodium bicarbonate injection 50 mEq (50 mEq Intravenous Given 12/19/2024 1502)  iohexol (OMNIPAQUE) 350 MG/ML injection 75 mL (75 mLs Intravenous Contrast Given 12/06/2024 1553)  atropine 1 MG/10ML injection 1 mg (1 mg Intravenous Given 11/22/2024 1601)     IMPRESSION / MDM / ASSESSMENT AND PLAN / ED COURSE  I reviewed the triage vital signs and the nursing notes.  64 year old female with PMH as noted above presents from dialysis in PEA arrest.  Differential diagnosis includes, but is not limited to, cardiac dysrhythmia, acute CHF exacerbation, ACS, hyperkalemia or other electrolyte abnormality, sepsis.  Patient's presentation is most consistent with acute presentation with potential threat to life or bodily function.  The patient is on the cardiac monitor to evaluate for evidence of arrhythmia and/or significant heart rate changes.  EMS reported that the patient was in PEA arrest during the entire transport.  She received 3 doses of epinephrine as well as bicarb and calcium .  On arrival to the ED she remained in PEA.  She was placed on the ZOLL monitor.  CPR was performed continuously.  The patient received an additional several doses of epinephrine.  She remained in PEA initially.  Subsequently during a pulse and rhythm check, she appeared to be in torsades.  She was defibrillated and given 2 g of IV  magnesium .  Subsequently she had ROSC.  She maintained a pulse for approximately 15 to 20 minutes.  The patient was intubated via RSI on the first attempt, without complications.  although then once again went into PEA arrest with no palpable pulse.  CPR was resumed.  Additional epinephrine was given.  The patient then once again had ROSC.  She was found to be hypotensive and was started on a Levophed infusion.  Sedation was given with Versed .  The patient's daughter was in the family room and I advised her  of the patient's clinical status, the multiple episodes of cardiac arrest, and her very guarded prognosis.  ----------------------------------------- 1:21 PM on 12/17/2024 -----------------------------------------  The patient is maintaining a pulse although is intermittently bradycardic.  She is requiring Levophed for blood pressure support, which is currently at its maximum dose.  The daughter states that the patient would not  have wanted extraordinary measures.  She advises that if the patient once again goes into cardiac arrest, she would not want us  to perform CPR attempt to resuscitate her.  ----------------------------------------- 2:06 PM on 12/09/2024 -----------------------------------------  The patient is somewhat improving.  We were able to go down on the infusion rate for the Levophed.  I am attempting to contact the ICU.  ----------------------------------------- 2:38 PM on 11/26/2024 -----------------------------------------  The patient is maintaining a low but adequate blood pressure on the Levophed infusion.  I consulted and discussed the case with Dr. Isaiah from the ICU who agrees to evaluate the patient for admission.  Chest x-ray shows no focal consolidation or edema.  Lab workup is significant for leukocytosis, slightly elevated troponin, and elevated BNP.    FINAL CLINICAL IMPRESSION(S) / ED DIAGNOSES   Final diagnoses:  Cardiac arrest (HCC)  Hypotension,  unspecified hypotension type     Rx / DC Orders   ED Discharge Orders     None        Note:  This document was prepared using Dragon voice recognition software and may include unintentional dictation errors.    Jacolyn Pae, MD 12/03/2024 1624  "

## 2024-12-20 NOTE — Code Documentation (Signed)
Levo increased to 15 mcg

## 2024-12-20 NOTE — ED Notes (Signed)
 Pt to CT and ICU with Crystal, RN and Norlene, RN

## 2024-12-20 NOTE — Code Documentation (Signed)
Versed started at 1mg/hr

## 2024-12-20 NOTE — H&P (Signed)
 "  NAME:  Sharon Arias, MRN:  [id number], DOB:  [birth year], LOS: 0 ADMISSION DATE:  12-27-2024,   CHIEF COMPLAINT:  cardiac arrest  Brief Pt Description / Synopsis:    History of Present Illness:  64 year old white female with a past medical history of end-stage renal disease on dialysis, CABG, CVA and hypertension and hyperlipidemia presented as an outpatient for dialysis which she had a cardiac arrest.  She had CPR for more than 40 minutes and was intubated and brought to the emergency room.  In the emergency room her initial EKG showed atrial fibrillation with controlled ventricular rate with 4 to 5 mm ST elevation in the inferior leads with reciprocal ST depression in V1-V3 suggestive of right ventricular involvement as well as 3 mm ST elevation in lateral leads V5 to V6.  Follow-up EKG showed junctional rhythm with right bundle branch block.  Because patient coded for like 40 minutes the family decided to make the patient DNR.  At this time patient is hypotensive and is in junctional rhythm about 40 bpm on Levophed  and unresponsive.   With prolonged down time, prognosis is grave.   GOALS OF CARE DISCUSSION   The Clinical status was relayed to family in detail-Daughter and Son at bedside   Updated and notified of patients medical condition- Patient remains unresponsive and will not open eyes to command.   Patient with increased WOB and using accessory muscles to breathe Explained to family course of therapy and the modalities  Patient with Progressive multiorgan failure with a very high probablity of a very minimal chance of meaningful recovery despite all aggressive and optimal medical therapy.     Family understands the situation.   They have consented and agreed to DNR/DNI and would like to proceed with Comfort care measures.   Family are satisfied with Plan of action and management. All questions answered    ED Course: Initial Vital Signs: Significant Labs: Imaging Chest  X-ray>> Medications Administered:   PCCM asked to admit for further workup and treatment.  Please see Significant Hospital Events section below for full detailed hospital course.  Interim History / Subjective:  As outlined above under Significant Hospital Events section  Objective   Blood pressure (!) 89/33, pulse 76, temperature (!) 97 F (36.1 C), resp. rate (!) 21, height 5' 7 (1.702 m), weight 109 kg, SpO2 100%.    Vent Mode: PRVC FiO2 (%):  [60 %-80 %] 60 % Set Rate:  [20 bmp] 20 bmp Vt Set:  [450 mL] 450 mL PEEP:  [5 cmH20] 5 cmH20  No intake or output data in the 24 hours ending 2024-12-27 1547 Filed Weights   12-27-2024 1305  Weight: 109 kg     REVIEW OF SYSTEMS  PATIENT IS UNABLE TO PROVIDE COMPLETE REVIEW OF SYSTEMS DUE TO SEVERE CRITICAL ILLNESS   PHYSICAL EXAMINATION:  GENERAL:critically ill appearing, +resp distress EYES: Pupils equal, round, reactive to light.  No scleral icterus.  MOUTH: Moist mucosal membrane. INTUBATED NECK: Supple.  PULMONARY: Lungs clear to auscultation, +rhonchi, +wheezing CARDIOVASCULAR: S1 and S2.  Regular rate and rhythm GASTROINTESTINAL: Soft, nontender, -distended. Positive bowel sounds.  MUSCULOSKELETAL: No swelling, clubbing, or edema.  NEUROLOGIC: obtunded,sedated SKIN:normal, warm to touch, Capillary refill delayed  Pulses present bilaterally    Assessment & Plan:  ACUTE CARDIAC FAILURE WITH STEMI MULTIORGAN FAILURE  PROGNOSIS IS GRAVE PATIENTS FAMILY AT BEDSIDE STATES THEY DO NOT WANT HEROIC MEASURES  Patient is now DNR/DNI and comfort care measures  Labs   CBC: Recent Labs  Lab 12-31-24 1200  WBC 16.2*  NEUTROABS 10.7*  HGB 10.9*  HCT 34.4*  MCV 94.5  PLT 252    Basic Metabolic Panel: Recent Labs  Lab December 31, 2024 1200  NA 141  K 3.0*  CL 100  CO2 23  GLUCOSE 223*  BUN 14  CREATININE 2.12*  CALCIUM  9.8  MG 2.3   GFR: Estimated Creatinine Clearance: 34.6 mL/min (A) (by C-G formula  based on SCr of 2.12 mg/dL (H)). Recent Labs  Lab 2024-12-31 1200  WBC 16.2*    Liver Function Tests: Recent Labs  Lab 2024-12-31 1200  AST 128*  ALT 113*  ALKPHOS 46  BILITOT 0.5  PROT 7.4  ALBUMIN 4.3   No results for input(s): LIPASE, AMYLASE in the last 168 hours. No results for input(s): AMMONIA in the last 168 hours.  ABG    Component Value Date/Time   PHART 7.35 December 31, 2024 1231   PCO2ART 31 (L) 12-31-24 1231   PO2ART 163 (H) 2024-12-31 1231   HCO3 17.1 (L) 12/31/2024 1231   TCO2 17 (L) 02/06/2021 0652   ACIDBASEDEF 7.3 (H) 12/31/24 1231   O2SAT 98.6 2024-12-31 1231     Coagulation Profile: No results for input(s): INR, PROTIME in the last 168 hours.  Cardiac Enzymes: No results for input(s): CKTOTAL, CKMB, CKMBINDEX, TROPONINI in the last 168 hours.  HbA1C: Hgb A1c MFr Bld  Date/Time Value Ref Range Status  07/05/2024 01:10 PM 6.7 (H) 4.8 - 5.6 % Final    Comment:    (NOTE) Diagnosis of Diabetes The following HbA1c ranges recommended by the American Diabetes Association (ADA) may be used as an aid in the diagnosis of diabetes mellitus.  Hemoglobin             Suggested A1C NGSP%              Diagnosis  <5.7                   Non Diabetic  5.7-6.4                Pre-Diabetic  >6.4                   Diabetic  <7.0                   Glycemic control for                       adults with diabetes.    11/26/2023 12:28 PM 8.1 (H) 4.8 - 5.6 % Final    Comment:    (NOTE) Pre diabetes:          5.7%-6.4%  Diabetes:              >6.4%  Glycemic control for   <7.0% adults with diabetes     CBG: Recent Labs  Lab 12/31/24 1154  GLUCAP 209*    Review of Systems:   Positives in BOLD: Gen: Denies fever, chills, weight change, fatigue, night sweats HEENT: Denies blurred vision, double vision, hearing loss, tinnitus, sinus congestion, rhinorrhea, sore throat, neck stiffness, dysphagia PULM: Denies shortness of breath, cough,  sputum production, hemoptysis, wheezing CV: Denies chest pain, edema, orthopnea, paroxysmal nocturnal dyspnea, palpitations GI: Denies abdominal pain, nausea, vomiting, diarrhea, hematochezia, melena, constipation, change in bowel habits GU: Denies dysuria, hematuria, polyuria, oliguria, urethral discharge Endocrine: Denies hot or cold intolerance, polyuria, polyphagia or appetite change Derm: Denies  rash, dry skin, scaling or peeling skin change Heme: Denies easy bruising, bleeding, bleeding gums Neuro: Denies headache, numbness, weakness, slurred speech, loss of memory or consciousness   Past Medical History:  She,  has a past medical history of Anemia in chronic kidney disease (05/19/2016), Anxiety, Aortic atherosclerosis, Blind, CAD (coronary artery disease), Cardiac murmur, CHF (congestive heart failure) (HCC), Chicken pox, Chronic anticoagulation, CVA (cerebral vascular accident) (HCC), DDD (degenerative disc disease), lumbar, Detached retina, Diabetic retinopathy (HCC), Diastolic congestive heart failure (HCC), Dyspnea, ESRD needing dialysis (HCC), CABG, Hyperlipidemia, Hypertension, NSTEMI (non-ST elevated myocardial infarction) (HCC) (06/2012), Obesity, Pneumonia, PONV (postoperative nausea and vomiting), Sciatica of right side, Secondary hyperparathyroidism of renal origin, Sleep apnea, Stage 5 chronic kidney disease (HCC), and T2DM (type 2 diabetes mellitus) (HCC).   Surgical History:   Past Surgical History:  Procedure Laterality Date   A/V FISTULAGRAM Left 04/21/2021   Procedure: A/V FISTULAGRAM;  Surgeon: Jama Cordella MATSU, MD;  Location: [facility];  Service: Cardiovascular;  Laterality: Left;   A/V FISTULAGRAM Left 11/02/2022   Procedure: A/V Fistulagram;  Surgeon: Jama Cordella MATSU, MD;  Location: [facility];  Service: Cardiovascular;  Laterality: Left;   AMPUTATION TOE Right 09/14/2024   Procedure: AMPUTATION, TOE;  Surgeon: Malvin Marsa FALCON, DPM;   Location: [facility];  Service: Orthopedics/Podiatry;  Laterality: Right;   AV FISTULA PLACEMENT Left 02/06/2021   Procedure: ARTERIOVENOUS (AV) FISTULA CREATION (BRACHIAL CEPHALIC );  Surgeon: Jama Cordella MATSU, MD;  Location: [facility];  Service: Vascular;  Laterality: Left;   COLONOSCOPY N/A 06/14/2024   Procedure: COLONOSCOPY;  Surgeon: Onita Elspeth Sharper, DO;  Location: [facility] ENDOSCOPY;  Service: Gastroenterology;  Laterality: N/A;  IDDM  Patient on Plavix    COLONOSCOPY N/A 07/30/2024   Procedure: COLONOSCOPY;  Surgeon: Onita Elspeth Sharper, DO;  Location[facility];  Service: Gastroenterology;  Laterality: N/A;  1st  AM case per Onita   COLONOSCOPY WITH PROPOFOL  N/A 01/26/2016   Procedure: COLONOSCOPY WITH PROPOFOL ;  Surgeon: Gladis RAYMOND Mariner, MD;  Location: [facility] ENDOSCOPY;  Service: Endoscopy;  Laterality: N/A;   COLONOSCOPY WITH PROPOFOL  N/A 01/27/2016   Procedure: COLONOSCOPY WITH PROPOFOL ;  Surgeon: Gladis RAYMOND Mariner, MD;  Location: [facility] ENDOSCOPY;  Service: Endoscopy;  Laterality: N/A;   CORONARY ARTERY BYPASS GRAFT  2013   DIALYSIS/PERMA CATHETER INSERTION N/A 02/03/2021   Procedure: DIALYSIS/PERMA CATHETER INSERTION;  Surgeon: Jama Cordella MATSU, MD;  Location: [facility];  Service: Cardiovascular;  Laterality: N/A;   DIALYSIS/PERMA CATHETER INSERTION N/A 06/16/2021   Procedure: DIALYSIS/PERMA CATHETER INSERTION;  Surgeon: Marea Selinda RAMAN, MD;  Location: [facility];  Service: Cardiovascular;  Laterality: N/A;   DIALYSIS/PERMA CATHETER REMOVAL N/A 09/22/2021   Procedure: DIALYSIS/PERMA CATHETER REMOVAL;  Surgeon: Jama Cordella MATSU, MD;  Location: [facility];  Service: Cardiovascular;  Laterality: N/A;   INCISION AND DRAINAGE     chest abscess   INCISION AND DRAINAGE ABSCESS N/A 12/11/2018   Procedure: INCISION AND DRAINAGE PERINEAL;  Surgeon: Jordis Laneta FALCON, MD;  Location: [facility];  Service: General;  Laterality: N/A;   LOWER EXTREMITY ANGIOGRAPHY Right  09/13/2024   Procedure: Lower Extremity Angiography;  Surgeon: Jama Cordella MATSU, MD;  Location: [facility];  Service: Cardiovascular;  Laterality: Right;   POLYPECTOMY  07/30/2024   Procedure: POLYPECTOMY, INTESTINE;  Surgeon: Onita Elspeth Sharper, DO;  Location: [facility] ENDOSCOPY;  Service: Gastroenterology;;   RETINAL LASER PROCEDURE       Social History:   reports that  she has never smoked. She has never been exposed to tobacco smoke. She has never used smokeless tobacco. She reports that she does not drink alcohol  and does not use drugs.   Family History:  Her family history includes Breast cancer (age of onset: 24) in her mother; Prostate cancer in her father.   Allergies Allergies[1]   Home Medications  Prior to Admission medications  Medication Sig Start Date End Date Taking? Authorizing Provider  acetaminophen  (TYLENOL ) 325 MG tablet Take 2 tablets (650 mg total) by mouth every 4 (four) hours as needed for mild pain (pain score 1-3) (or temp > 37.5 C (99.5 F)). 12/12/23   Angiulli, Toribio PARAS, PA-C  allopurinol  (ZYLOPRIM ) 300 MG tablet Take 1 tablet (300 mg total) by mouth in the morning. 12/15/23   Angiulli, Toribio PARAS, PA-C  amLODipine  (NORVASC ) 5 MG tablet Take 1 tablet (5 mg total) by mouth at bedtime. 12/15/23   Angiulli, Toribio PARAS, PA-C  ascorbic acid  (VITAMIN C ) 500 MG tablet Take 1 tablet (500 mg total) by mouth 2 (two) times daily. 09/16/24   Awanda City, MD  aspirin  EC 81 MG tablet Take 81 mg by mouth in the morning.    [provider]  clopidogrel  (PLAVIX ) 75 MG tablet Take 1 tablet (75 mg total) by mouth daily. 12/15/23   Angiulli, Toribio PARAS, PA-C  epoetin  alfa-epbx (RETACRIT ) 10000 UNIT/ML injection Inject 0.4 mLs (4,000 Units total) into the vein every Monday, Wednesday, and Friday at 6 PM. 09/17/24   Awanda City, MD  furosemide  (LASIX ) 40 MG tablet Take 1 tablet (40 mg total) by mouth daily as needed for edema. 12/15/23   Angiulli, Toribio PARAS, PA-C  hydrALAZINE   (APRESOLINE ) 50 MG tablet Take 1 tablet (50 mg total) by mouth in the morning and at bedtime. 12/15/23   Angiulli, Toribio PARAS, PA-C  insulin  lispro (HUMALOG ) 100 UNIT/ML KwikPen Inject 20 Units into the skin 3 (three) times daily with meals. 12/15/23   Angiulli, Toribio PARAS, PA-C  losartan  (COZAAR ) 100 MG tablet Take 1 tablet (100 mg total) by mouth at bedtime. 12/15/23   Angiulli, Toribio PARAS, PA-C  metoprolol  tartrate (LOPRESSOR ) 50 MG tablet Take 1 tablet (50 mg total) by mouth 2 (two) times daily. 12/15/23   Angiulli, Daniel J, PA-C  multivitamin (RENA-VIT) TABS tablet Take 1 tablet by mouth at bedtime. 02/18/21   [provider]  Nutritional Supplements (FEEDING SUPPLEMENT, NEPRO CARB STEADY,) LIQD Take 237 mLs by mouth 3 (three) times daily between meals. 09/16/24   Awanda [place], MD  rosuvastatin  (CRESTOR ) 40 MG tablet Take 40 mg by mouth.  Take 40 mg by mouth. 06/03/20   [provider]  sevelamer  carbonate (RENVELA ) 800 MG tablet Take 1 tablet (800 mg total) by mouth 3 (three) times daily. 12/15/23   Angiulli, Toribio PARAS, PA-C  TRESIBA  FLEXTOUCH 200 UNIT/ML FlexTouch Pen Inject 60 Units into the skin daily. Reduced from 75 units. 09/16/24   Awanda City, MD  zinc  sulfate, 50mg  elemental zinc , 220 (50 Zn) MG capsule Take 1 capsule (220 mg total) by mouth daily. 09/17/24   Awanda City, MD        Critical Care Time devoted to patient care services described in this note is 105 minutes.  Critical care was necessary to treat /prevent imminent and life-threatening deterioration. Overall, patient is critically ill, prognosis is guarded.  Patient with Multiorgan failure and at high risk for cardiac arrest and death.    Nickolas Alm Cellar, M.D.  [facility]  Medical Director [facility]         [1]  Allergies Allergen Reactions   Ozempic (0.25 Or 0.5 Mg-Dose) [Semaglutide(0.25 Or 0.5mg -Dos)] Diarrhea and Nausea Only   Trulicity [Dulaglutide] Diarrhea  and Nausea Only   Other     Anesthesia--nausea/vomiting   "

## 2024-12-20 NOTE — Consult Note (Signed)
 "   Sharon Arias is a 64 y.o. female  [id number]  Primary Cardiologist: Dr. Larey Reason for Consultation: Cardiac arrest  HPI: This is a 64 year old white female with a past medical history of end-stage renal disease on dialysis, CABG, CVA and hypertension and hyperlipidemia presented as an outpatient for dialysis which she had a cardiac arrest.  She had CPR for more than 40 minutes and was intubated and brought to the emergency room.  In the emergency room her initial EKG showed atrial fibrillation with controlled ventricular rate with 4 to 5 mm ST elevation in the inferior leads with reciprocal ST depression in V1-V3 suggestive of right ventricular involvement as well as 3 mm ST elevation in lateral leads V5 to V6.  Follow-up EKG showed junctional rhythm with right bundle branch block.  Because patient coded for like 40 minutes the family decided to make the patient DNR.  At this time patient is hypotensive and is in junctional rhythm about 40 bpm on Levophed  and unresponsive.   Review of Systems: No further history is available as patient is unresponsive and the family members are not available.   Past Medical History:  Diagnosis Date   Anemia in chronic kidney disease 05/19/2016   Anxiety    Aortic atherosclerosis    Blind    CAD (coronary artery disease)    Cardiac murmur    CHF (congestive heart failure) (HCC)    G2DD on 02/2020 TTE   Chicken pox    Chronic anticoagulation    CVA (cerebral vascular accident) (HCC)    DDD (degenerative disc disease), lumbar    Detached retina    Diabetic retinopathy (HCC)    legally blind   Diastolic congestive heart failure (HCC)    Dyspnea    ESRD needing dialysis (HCC)    Hx of CABG    Hyperlipidemia    Hypertension    NSTEMI (non-ST elevated myocardial infarction) (HCC) 06/2012   Obesity    Pneumonia    PONV (postoperative nausea and vomiting)    Sciatica of right side    Secondary hyperparathyroidism of renal origin    Sleep  apnea    CPAP NIGHTLY   Stage 5 chronic kidney disease (HCC)    T2DM (type 2 diabetes mellitus) (HCC)     Medications Prior to Admission  Medication Sig Dispense Refill   acetaminophen  (TYLENOL ) 325 MG tablet Take 2 tablets (650 mg total) by mouth every 4 (four) hours as needed for mild pain (pain score 1-3) (or temp > 37.5 C (99.5 F)).     allopurinol  (ZYLOPRIM ) 300 MG tablet Take 1 tablet (300 mg total) by mouth in the morning. 30 tablet 0   amLODipine  (NORVASC ) 5 MG tablet Take 1 tablet (5 mg total) by mouth at bedtime. 30 tablet 0   ascorbic acid  (VITAMIN C ) 500 MG tablet Take 1 tablet (500 mg total) by mouth 2 (two) times daily.     aspirin  EC 81 MG tablet Take 81 mg by mouth in the morning.     clopidogrel  (PLAVIX ) 75 MG tablet Take 1 tablet (75 mg total) by mouth daily. 30 tablet 0   epoetin  alfa-epbx (RETACRIT ) 10000 UNIT/ML injection Inject 0.4 mLs (4,000 Units total) into the vein every Monday, Wednesday, and Friday at 6 PM.     furosemide  (LASIX ) 40 MG tablet Take 1 tablet (40 mg total) by mouth daily as needed for edema. 30 tablet 0   hydrALAZINE  (APRESOLINE ) 50 MG tablet Take 1 tablet (  50 mg total) by mouth in the morning and at bedtime. 60 tablet 0   insulin  lispro (HUMALOG ) 100 UNIT/ML KwikPen Inject 20 Units into the skin 3 (three) times daily with meals. 15 mL 11   losartan  (COZAAR ) 100 MG tablet Take 1 tablet (100 mg total) by mouth at bedtime. 30 tablet 0   metoprolol  tartrate (LOPRESSOR ) 50 MG tablet Take 1 tablet (50 mg total) by mouth 2 (two) times daily. 60 tablet 0   multivitamin (RENA-VIT) TABS tablet Take 1 tablet by mouth at bedtime.     Nutritional Supplements (FEEDING SUPPLEMENT, NEPRO CARB STEADY,) LIQD Take 237 mLs by mouth 3 (three) times daily between meals.     rosuvastatin  (CRESTOR ) 40 MG tablet Take 40 mg by mouth.  Take 40 mg by mouth.     sevelamer  carbonate (RENVELA ) 800 MG tablet Take 1 tablet (800 mg total) by mouth 3 (three) times daily. 30 tablet 0    TRESIBA  FLEXTOUCH 200 UNIT/ML FlexTouch Pen Inject 60 Units into the skin daily. Reduced from 75 units.     zinc  sulfate, 50mg  elemental zinc , 220 (50 Zn) MG capsule Take 1 capsule (220 mg total) by mouth daily.        Chlorhexidine  Gluconate Cloth  6 each Topical Daily   EPINEPHrine        etomidate        hydrocortisone  sod succinate (SOLU-CORTEF ) inj  100 mg Intravenous Q12H   succinylcholine         Infusions:  midazolam  7 mg/hr (2024-12-29 1406)   midazolam -sodium chloride      norepinephrine  (LEVOPHED ) Adult infusion 40 mcg/min (12/29/2024 1518)   piperacillin -tazobactam (ZOSYN )  IV     potassium chloride      vancomycin      Followed by   vancomycin      vasopressin       Allergies[1]  Social History   Socioeconomic History   Marital status: Married    Spouse name: Jumanah Hynson   Number of children: 3   Years of education: Not on file   Highest education level: Not on file  Occupational History   Not on file  Tobacco Use   Smoking status: Never    Passive exposure: Never   Smokeless tobacco: Never  Vaping Use   Vaping status: Never Used  Substance and Sexual Activity   Alcohol  use: No   Drug use: No   Sexual activity: Not Currently  Other Topics Concern   Not on file  Social History Narrative   Lives at home with spouse    Social Drivers of Health   Tobacco Use: Low Risk (December 29, 2024)   Patient History    Smoking Tobacco Use: Never    Smokeless Tobacco Use: Never    Passive Exposure: Never  Financial Resource Strain: Low Risk  (08/06/2024)   Received from [facility]   Overall Financial Resource Strain (CARDIA)    Difficulty of Paying Living Expenses: Not very hard  Food Insecurity: No Food Insecurity (09/11/2024)   Epic    Worried About Running Out of Food in the Last Year: Never true    Ran Out of Food in the Last Year: Never true  Transportation Needs: Unmet Transportation Needs (09/11/2024)   Epic    Lack of Transportation (Medical): Yes     Lack of Transportation (Non-Medical): No  Physical Activity: Not on file  Stress: Not on file  Social Connections: Socially Isolated (09/11/2024)   Social Connection and Isolation Panel    Frequency of Communication  with Friends and Family: More than three times a week    Frequency of Social Gatherings with Friends and Family: Once a week    Attends Religious Services: Never    Database Administrator or Organizations: No    Attends Banker Meetings: Never    Marital Status: Widowed  Intimate Partner Violence: Not At Risk (09/11/2024)   Epic    Fear of Current or Ex-Partner: No    Emotionally Abused: No    Physically Abused: No    Sexually Abused: No  Depression (PHQ2-9): Not on file  Alcohol  Screen: Not on file  Housing: Low Risk (09/11/2024)   Epic    Unable to Pay for Housing in the Last Year: No    Number of Times Moved in the Last Year: 0    Homeless in the Last Year: No  Utilities: Not At Risk (09/11/2024)   Epic    Threatened with loss of utilities: No  Health Literacy: Not on file    Family History  Problem Relation Age of Onset   Breast cancer Mother 48   Prostate cancer Father        we think mets to liver and bone    PHYSICAL EXAM: Vitals:   2024/12/30 1440 December 30, 2024 1552  BP: (!) 89/33 (!) 119/99  Pulse: 76 (!) 46  Resp: (!) 21 18  Temp: (!) 97 F (36.1 C) (!) 96.8 F (36 C)  SpO2: 100% 99%    No intake or output data in the 24 hours ending 2024-12-30 1613  General:  Well appearing. No respiratory difficulty HEENT: normal Neck: supple. no JVD. Carotids 2+ bilat; no bruits. No lymphadenopathy or thryomegaly appreciated. Cor: PMI nondisplaced. Regular rate & rhythm. No rubs, gallops or murmurs. Lungs: clear Abdomen: soft, nontender, nondistended. No hepatosplenomegaly. No bruits or masses. Good bowel sounds. Extremities: no cyanosis, clubbing, rash, edema Neuro: alert & oriented x 3, cranial nerves grossly intact. moves all 4 extremities w/o  difficulty. Affect pleasant.  ECG: Initial EKG, atrial fibrillation with controlled ventricular rate about 80 bpm with inferolateral STEMI with right ventricular involvement.  There was 5 mm ST elevation in the inferior lead to 3 aVF and 3 mm ST elevation in lead V5 to V6 and 3 mm ST depression in V1-V3.  Follow-up EKG showed junctional rhythm about 40 bpm with bizarre wide-complex pattern with right bundle branch block.  Results for orders placed or performed during the hospital encounter of 2024/12/30 (from the past 24 hours)  CBG monitoring, ED     Status: Abnormal   Collection Time: 12/30/24 11:54 AM  Result Value Ref Range   Glucose-Capillary 209 (H) 70 - 99 mg/dL  CBC with Differential     Status: Abnormal   Collection Time: 2024-12-30 12:00 PM  Result Value Ref Range   WBC 16.2 (H) 4.0 - 10.5 K/uL   RBC 3.64 (L) 3.87 - 5.11 MIL/uL   Hemoglobin 10.9 (L) 12.0 - 15.0 g/dL   HCT 65.5 (L) 63.9 - 53.9 %   MCV 94.5 80.0 - 100.0 fL   MCH 29.9 26.0 - 34.0 pg   MCHC 31.7 30.0 - 36.0 g/dL   RDW 85.0 88.4 - 84.4 %   Platelets 252 150 - 400 K/uL   nRBC 0.1 0.0 - 0.2 %   Neutrophils Relative % 66 %   Neutro Abs 10.7 (H) 1.7 - 7.7 K/uL   Lymphocytes Relative 24 %   Lymphs Abs 3.9 0.7 - 4.0 K/uL  Monocytes Relative 5 %   Monocytes Absolute 0.8 0.1 - 1.0 K/uL   Eosinophils Relative 2 %   Eosinophils Absolute 0.3 0.0 - 0.5 K/uL   Basophils Relative 1 %   Basophils Absolute 0.1 0.0 - 0.1 K/uL   Immature Granulocytes 2 %   Abs Immature Granulocytes 0.32 (H) 0.00 - 0.07 K/uL  Comprehensive metabolic panel with GFR     Status: Abnormal   Collection Time: 16-Dec-2024 12:00 PM  Result Value Ref Range   Sodium 141 135 - 145 mmol/L   Potassium 3.0 (L) 3.5 - 5.1 mmol/L   Chloride 100 98 - 111 mmol/L   CO2 23 22 - 32 mmol/L   Glucose, Bld 223 (H) 70 - 99 mg/dL   BUN 14 8 - 23 mg/dL   Creatinine, Ser 7.87 (H) 0.44 - 1.00 mg/dL   Calcium  9.8 8.9 - 10.3 mg/dL   Total Protein 7.4 6.5 - 8.1 g/dL    Albumin 4.3 3.5 - 5.0 g/dL   AST 871 (H) 15 - 41 U/L   ALT 113 (H) 0 - 44 U/L   Alkaline Phosphatase 46 38 - 126 U/L   Total Bilirubin 0.5 0.0 - 1.2 mg/dL   GFR, Estimated 26 (L) >60 mL/min   Anion gap 18 (H) 5 - 15  Troponin T, High Sensitivity     Status: Abnormal   Collection Time: 12/16/24 12:00 PM  Result Value Ref Range   Troponin T High Sensitivity 120 (HH) 0 - 19 ng/L  Magnesium      Status: None   Collection Time: 12/16/2024 12:00 PM  Result Value Ref Range   Magnesium  2.3 1.7 - 2.4 mg/dL  Pro Brain natriuretic peptide     Status: Abnormal   Collection Time: Dec 16, 2024 12:00 PM  Result Value Ref Range   Pro Brain Natriuretic Peptide 4,593.0 (H) <300.0 pg/mL  Blood gas, arterial     Status: Abnormal   Collection Time: 12-16-24 12:31 PM  Result Value Ref Range   FIO2 80 %   Delivery systems VENTILATOR    Mode PRESSURE REGULATED VOLUME CONTROL    MECHVT 450 mL   PEEP 5 cm H20   pH, Arterial 7.35 7.35 - 7.45   pCO2 arterial 31 (L) 32 - 48 mmHg   pO2, Arterial 163 (H) 83 - 108 mmHg   Bicarbonate 17.1 (L) 20.0 - 28.0 mmol/L   Acid-base deficit 7.3 (H) 0.0 - 2.0 mmol/L   O2 Saturation 98.6 %   Patient temperature 37.0    Collection site LEFT RADIAL    Allens test (pass/fail) PASS PASS   Mechanical Rate 20   Resp panel by RT-PCR (RSV, Flu A&B, Covid) Anterior Nasal Swab     Status: None   Collection Time: 12/16/2024  2:54 PM   Specimen: Anterior Nasal Swab  Result Value Ref Range   SARS Coronavirus 2 by RT PCR NEGATIVE NEGATIVE   Influenza A by PCR NEGATIVE NEGATIVE   Influenza B by PCR NEGATIVE NEGATIVE   Resp Syncytial Virus by PCR NEGATIVE NEGATIVE  Glucose, capillary     Status: Abnormal   Collection Time: 12/16/24  3:50 PM  Result Value Ref Range   Glucose-Capillary 454 (H) 70 - 99 mg/dL   CT HEAD WO CONTRAST ( ) Result Date: 12-16-24 EXAM: CT HEAD WITHOUT CONTRAST 12-16-2024 03:53:41 PM TECHNIQUE: CT of the head was performed without the administration of  intravenous contrast. Automated exposure control, iterative reconstruction, and/or weight based adjustment of the mA/kV was utilized  to reduce the radiation dose to as low as reasonably achievable. COMPARISON: 05/10/2024 CLINICAL HISTORY: Cardiac arrest pulseless electrical activity. FINDINGS: BRAIN AND VENTRICLES: No acute hemorrhage. Stable chronic right parietal lobe infarct. No evidence of acute infarct. No hydrocephalus. No extra-axial collection. No mass effect or midline shift. Partially visualized support tubes. ORBITS: Bilateral lens replacement noted. SINUSES: Air-fluid level in right maxillary sinus. Mild mucosal thickening in left sphenoid sinus. SOFT TISSUES AND SKULL: No acute soft tissue abnormality. No skull fracture. IMPRESSION: 1. No acute intracranial abnormality. Electronically signed by: Lonni Necessary MD January 06, 2025 04:05 PM EST RP Workstation: [id number]   DG Chest Portable 1 View Result Date: 01-06-25 CLINICAL DATA:  Status post intubation. EXAM: PORTABLE CHEST 1 VIEW COMPARISON:  September 10, 2024 FINDINGS: Since the prior study there is been interval placement of an endotracheal tube with its distal tip approximately 2.3 cm from the carina. Multiple sternal wires and vascular clips are seen. The cardiac silhouette is enlarged and unchanged in size. A radiopaque loop recorder device is in place. Low lung volumes are noted with mild prominence of the perihilar pulmonary vasculature. Areas of right suprahilar and right infrahilar atelectasis and/or infiltrate are suspected. The visualized skeletal structures are unremarkable. IMPRESSION: 1. Interval endotracheal tube placement, as described above. 2. Stable cardiomegaly and evidence of prior median sternotomy/CABG with mild pulmonary vascular congestion. 3. Right suprahilar and right infrahilar atelectasis and/or infiltrate. Electronically Signed   By: Suzen Dials M.D.   On: 01/06/2025 14:30    ASSESSMENT AND PLAN: #1  status post cardiac arrest due to inferolateral STEMI involving RV involvement.  Discussed with Dr. Darron, and it was decided to treat the patient conservatively since she coded for 40 minutes and has been made DNR.  Advise adding IV fluid because of RV involvement beside pressors.  Advised heparin  as per protocol.  Advise getting echocardiogram.  Patient has a prior history of having CABG and probably occluded the RCA graft.  Will follow the patient closely with you.  Patient will not be taken to the Cath Lab as per Dr. Darron.  Try to find the family in the waiting room and tried contacting on the phone however were unable to reach them.  Prognosis is very poor given patient is hypotensive and in junctional rhythm.  Sharon Arias     [1]  Allergies Allergen Reactions   Ozempic (0.25 Or 0.5 Mg-Dose) [Semaglutide(0.25 Or 0.5mg -Dos)] Diarrhea and Nausea Only   Trulicity [Dulaglutide] Diarrhea and Nausea Only   Other     Anesthesia--nausea/vomiting   "

## 2024-12-20 NOTE — ED Notes (Signed)
 Pt started on 1 mg of Versed  for sedation pt breathing over vent and grimacing

## 2024-12-20 NOTE — Code Documentation (Signed)
Levo started at 10 mcg/min

## 2024-12-20 NOTE — Progress Notes (Signed)
 1545-Pt arrived on unit; HR in 30's;Pt agonal breathing; MD Kasa notified; Inge NP at bedside; 1 mg of atropine given  1621- Dopamine started per MD Isadore Inge NP at bedside to place central line; Family notified to return to hospital  1645-MD Kasa had conversation with family at beside to transition to comfort care  1649- Comfort care orders placed  1655-Pt goes into Vtach with no pulse then to Vfib  1659- Pt time of death; family at bedside; MD Kasa notified  1702-Pt extubated by RT   1710-[place] notified TOD

## 2024-12-20 DEATH — deceased

## 2025-02-28 ENCOUNTER — Ambulatory Visit: Admitting: Podiatry
